# Patient Record
Sex: Female | Born: 1935 | Race: White | Hispanic: No | Smoking: Never smoker
Health system: Southern US, Community
[De-identification: ages and names within clinical notes are randomized; demographics above are authoritative.]

## PROBLEM LIST (undated history)

## (undated) DIAGNOSIS — F419 Anxiety disorder, unspecified: Secondary | ICD-10-CM

## (undated) DIAGNOSIS — K228 Other specified diseases of esophagus: Secondary | ICD-10-CM

## (undated) DIAGNOSIS — H269 Unspecified cataract: Secondary | ICD-10-CM

## (undated) DIAGNOSIS — G63 Polyneuropathy in diseases classified elsewhere: Secondary | ICD-10-CM

## (undated) DIAGNOSIS — R569 Unspecified convulsions: Secondary | ICD-10-CM

## (undated) DIAGNOSIS — G4730 Sleep apnea, unspecified: Secondary | ICD-10-CM

## (undated) DIAGNOSIS — E785 Hyperlipidemia, unspecified: Secondary | ICD-10-CM

## (undated) DIAGNOSIS — N390 Urinary tract infection, site not specified: Secondary | ICD-10-CM

## (undated) DIAGNOSIS — K829 Disease of gallbladder, unspecified: Secondary | ICD-10-CM

## (undated) DIAGNOSIS — I639 Cerebral infarction, unspecified: Secondary | ICD-10-CM

## (undated) DIAGNOSIS — R600 Localized edema: Secondary | ICD-10-CM

## (undated) DIAGNOSIS — E669 Obesity, unspecified: Secondary | ICD-10-CM

## (undated) DIAGNOSIS — H9190 Unspecified hearing loss, unspecified ear: Secondary | ICD-10-CM

## (undated) DIAGNOSIS — Z9289 Personal history of other medical treatment: Secondary | ICD-10-CM

## (undated) DIAGNOSIS — G2581 Restless legs syndrome: Secondary | ICD-10-CM

## (undated) DIAGNOSIS — I6930 Unspecified sequelae of cerebral infarction: Secondary | ICD-10-CM

## (undated) DIAGNOSIS — I4891 Unspecified atrial fibrillation: Secondary | ICD-10-CM

## (undated) DIAGNOSIS — G5602 Carpal tunnel syndrome, left upper limb: Secondary | ICD-10-CM

## (undated) DIAGNOSIS — R296 Repeated falls: Secondary | ICD-10-CM

## (undated) DIAGNOSIS — I10 Essential (primary) hypertension: Secondary | ICD-10-CM

## (undated) DIAGNOSIS — M1990 Unspecified osteoarthritis, unspecified site: Secondary | ICD-10-CM

## (undated) DIAGNOSIS — K2289 Other specified disease of esophagus: Secondary | ICD-10-CM

## (undated) DIAGNOSIS — R251 Tremor, unspecified: Secondary | ICD-10-CM

## (undated) DIAGNOSIS — K219 Gastro-esophageal reflux disease without esophagitis: Secondary | ICD-10-CM

## (undated) DIAGNOSIS — G40109 Localization-related (focal) (partial) symptomatic epilepsy and epileptic syndromes with simple partial seizures, not intractable, without status epilepticus: Secondary | ICD-10-CM

## (undated) HISTORY — DX: Repeated falls: R29.6

## (undated) HISTORY — DX: Personal history of other medical treatment: Z92.89

## (undated) HISTORY — DX: Anxiety disorder, unspecified: F41.9

## (undated) HISTORY — DX: Unspecified sequelae of cerebral infarction: I69.30

## (undated) HISTORY — DX: Disease of gallbladder, unspecified: K82.9

## (undated) HISTORY — DX: Cerebral infarction, unspecified: I63.9

## (undated) HISTORY — DX: Obesity, unspecified: E66.9

## (undated) HISTORY — DX: Other specified disease of esophagus: K22.89

## (undated) HISTORY — DX: Hyperlipidemia, unspecified: E78.5

## (undated) HISTORY — DX: Tremor, unspecified: R25.1

## (undated) HISTORY — PX: APPENDECTOMY: SHX54

## (undated) HISTORY — PX: CHOLECYSTECTOMY: SHX55

## (undated) HISTORY — DX: Unspecified hearing loss, unspecified ear: H91.90

## (undated) HISTORY — DX: Unspecified osteoarthritis, unspecified site: M19.90

## (undated) HISTORY — DX: Sleep apnea, unspecified: G47.30

## (undated) HISTORY — DX: Restless legs syndrome: G25.81

## (undated) HISTORY — DX: Polyneuropathy in diseases classified elsewhere: G63

## (undated) HISTORY — PX: ABDOMINAL HYSTERECTOMY: SHX81

## (undated) HISTORY — PX: CATARACT EXTRACTION: SUR2

## (undated) HISTORY — DX: Gastro-esophageal reflux disease without esophagitis: K21.9

## (undated) HISTORY — DX: Localization-related (focal) (partial) symptomatic epilepsy and epileptic syndromes with simple partial seizures, not intractable, without status epilepticus: G40.109

## (undated) HISTORY — PX: TONSILLECTOMY: SUR1361

## (undated) HISTORY — PX: CARPAL TUNNEL RELEASE: SHX101

## (undated) HISTORY — PX: OTHER SURGICAL HISTORY: SHX169

## (undated) HISTORY — DX: Unspecified convulsions: R56.9

## (undated) HISTORY — PX: FRACTURE SURGERY: SHX138

## (undated) HISTORY — DX: Unspecified cataract: H26.9

## (undated) HISTORY — DX: Unspecified atrial fibrillation: I48.91

## (undated) HISTORY — DX: Carpal tunnel syndrome, left upper limb: G56.02

## (undated) HISTORY — DX: Other specified diseases of esophagus: K22.8

## (undated) HISTORY — PX: CATARACT EXTRACTION, BILATERAL: SHX1313

## (undated) HISTORY — PX: BREAST SURGERY: SHX581

---

## 1998-07-30 ENCOUNTER — Ambulatory Visit (HOSPITAL_BASED_OUTPATIENT_CLINIC_OR_DEPARTMENT_OTHER): Admission: RE | Admit: 1998-07-30 | Discharge: 1998-07-30 | Payer: Self-pay | Admitting: Plastic Surgery

## 1999-05-06 ENCOUNTER — Ambulatory Visit (HOSPITAL_COMMUNITY): Admission: RE | Admit: 1999-05-06 | Discharge: 1999-05-06 | Payer: Self-pay | Admitting: Cardiology

## 1999-11-25 ENCOUNTER — Ambulatory Visit (HOSPITAL_COMMUNITY): Admission: RE | Admit: 1999-11-25 | Discharge: 1999-11-25 | Payer: Self-pay | Admitting: Gastroenterology

## 1999-11-25 HISTORY — PX: UPPER GASTROINTESTINAL ENDOSCOPY: SHX188

## 1999-11-30 ENCOUNTER — Ambulatory Visit (HOSPITAL_COMMUNITY): Admission: RE | Admit: 1999-11-30 | Discharge: 1999-11-30 | Payer: Self-pay | Admitting: Gastroenterology

## 2000-02-15 ENCOUNTER — Encounter (INDEPENDENT_AMBULATORY_CARE_PROVIDER_SITE_OTHER): Payer: Self-pay | Admitting: *Deleted

## 2000-02-15 ENCOUNTER — Inpatient Hospital Stay (HOSPITAL_COMMUNITY)
Admission: RE | Admit: 2000-02-15 | Discharge: 2000-02-18 | Payer: Self-pay | Admitting: Thoracic Surgery (Cardiothoracic Vascular Surgery)

## 2000-02-15 ENCOUNTER — Encounter: Payer: Self-pay | Admitting: Thoracic Surgery (Cardiothoracic Vascular Surgery)

## 2000-02-15 HISTORY — PX: BRONCHOSCOPY: SUR163

## 2000-02-16 ENCOUNTER — Encounter: Payer: Self-pay | Admitting: Thoracic Surgery (Cardiothoracic Vascular Surgery)

## 2000-02-17 ENCOUNTER — Encounter: Payer: Self-pay | Admitting: Thoracic Surgery (Cardiothoracic Vascular Surgery)

## 2000-03-02 ENCOUNTER — Encounter: Payer: Self-pay | Admitting: Thoracic Surgery (Cardiothoracic Vascular Surgery)

## 2000-03-02 ENCOUNTER — Encounter
Admission: RE | Admit: 2000-03-02 | Discharge: 2000-03-02 | Payer: Self-pay | Admitting: Thoracic Surgery (Cardiothoracic Vascular Surgery)

## 2000-08-31 ENCOUNTER — Encounter
Admission: RE | Admit: 2000-08-31 | Discharge: 2000-08-31 | Payer: Self-pay | Admitting: Thoracic Surgery (Cardiothoracic Vascular Surgery)

## 2000-08-31 ENCOUNTER — Encounter: Payer: Self-pay | Admitting: Thoracic Surgery (Cardiothoracic Vascular Surgery)

## 2000-11-02 ENCOUNTER — Encounter
Admission: RE | Admit: 2000-11-02 | Discharge: 2000-11-02 | Payer: Self-pay | Admitting: Thoracic Surgery (Cardiothoracic Vascular Surgery)

## 2000-11-02 ENCOUNTER — Encounter: Payer: Self-pay | Admitting: Thoracic Surgery (Cardiothoracic Vascular Surgery)

## 2001-01-16 ENCOUNTER — Encounter: Payer: Self-pay | Admitting: Gastroenterology

## 2001-01-16 HISTORY — PX: COLONOSCOPY: SHX174

## 2001-02-14 ENCOUNTER — Encounter: Payer: Self-pay | Admitting: Internal Medicine

## 2001-02-14 ENCOUNTER — Ambulatory Visit (HOSPITAL_COMMUNITY): Admission: RE | Admit: 2001-02-14 | Discharge: 2001-02-14 | Payer: Self-pay | Admitting: Internal Medicine

## 2001-04-20 ENCOUNTER — Ambulatory Visit (HOSPITAL_COMMUNITY): Admission: RE | Admit: 2001-04-20 | Discharge: 2001-04-20 | Payer: Self-pay | Admitting: Internal Medicine

## 2001-04-20 ENCOUNTER — Encounter: Payer: Self-pay | Admitting: Internal Medicine

## 2001-05-24 ENCOUNTER — Encounter
Admission: RE | Admit: 2001-05-24 | Discharge: 2001-05-24 | Payer: Self-pay | Admitting: Thoracic Surgery (Cardiothoracic Vascular Surgery)

## 2001-05-24 ENCOUNTER — Encounter: Payer: Self-pay | Admitting: Thoracic Surgery (Cardiothoracic Vascular Surgery)

## 2001-05-25 ENCOUNTER — Encounter: Payer: Self-pay | Admitting: Internal Medicine

## 2001-05-25 ENCOUNTER — Ambulatory Visit (HOSPITAL_COMMUNITY): Admission: RE | Admit: 2001-05-25 | Discharge: 2001-05-25 | Payer: Self-pay | Admitting: Internal Medicine

## 2001-08-31 ENCOUNTER — Encounter: Payer: Self-pay | Admitting: Internal Medicine

## 2001-08-31 ENCOUNTER — Ambulatory Visit (HOSPITAL_COMMUNITY): Admission: RE | Admit: 2001-08-31 | Discharge: 2001-08-31 | Payer: Self-pay | Admitting: Internal Medicine

## 2002-02-01 ENCOUNTER — Ambulatory Visit (HOSPITAL_COMMUNITY): Admission: RE | Admit: 2002-02-01 | Discharge: 2002-02-01 | Payer: Self-pay | Admitting: Internal Medicine

## 2002-02-01 ENCOUNTER — Encounter: Payer: Self-pay | Admitting: Internal Medicine

## 2002-05-30 ENCOUNTER — Encounter
Admission: RE | Admit: 2002-05-30 | Discharge: 2002-05-30 | Payer: Self-pay | Admitting: Thoracic Surgery (Cardiothoracic Vascular Surgery)

## 2002-05-30 ENCOUNTER — Encounter: Payer: Self-pay | Admitting: Thoracic Surgery (Cardiothoracic Vascular Surgery)

## 2003-02-25 ENCOUNTER — Encounter: Payer: Self-pay | Admitting: Internal Medicine

## 2003-02-25 ENCOUNTER — Ambulatory Visit (HOSPITAL_COMMUNITY): Admission: RE | Admit: 2003-02-25 | Discharge: 2003-02-25 | Payer: Self-pay | Admitting: Internal Medicine

## 2003-04-02 ENCOUNTER — Encounter: Payer: Self-pay | Admitting: Gastroenterology

## 2003-04-02 ENCOUNTER — Ambulatory Visit (HOSPITAL_COMMUNITY): Admission: RE | Admit: 2003-04-02 | Discharge: 2003-04-02 | Payer: Self-pay | Admitting: Gastroenterology

## 2003-04-23 ENCOUNTER — Encounter: Payer: Self-pay | Admitting: Gastroenterology

## 2003-04-23 HISTORY — PX: ESOPHAGOGASTRODUODENOSCOPY: SHX1529

## 2003-05-31 ENCOUNTER — Encounter
Admission: RE | Admit: 2003-05-31 | Discharge: 2003-05-31 | Payer: Self-pay | Admitting: Thoracic Surgery (Cardiothoracic Vascular Surgery)

## 2003-10-07 ENCOUNTER — Ambulatory Visit (HOSPITAL_COMMUNITY): Admission: RE | Admit: 2003-10-07 | Discharge: 2003-10-07 | Payer: Self-pay | Admitting: Internal Medicine

## 2004-04-13 ENCOUNTER — Encounter
Admission: RE | Admit: 2004-04-13 | Discharge: 2004-04-13 | Payer: Self-pay | Admitting: Thoracic Surgery (Cardiothoracic Vascular Surgery)

## 2004-11-09 ENCOUNTER — Ambulatory Visit: Payer: Self-pay | Admitting: Orthopedic Surgery

## 2004-11-11 ENCOUNTER — Encounter: Payer: Self-pay | Admitting: Orthopedic Surgery

## 2005-02-12 ENCOUNTER — Ambulatory Visit: Payer: Self-pay | Admitting: Orthopedic Surgery

## 2005-02-12 ENCOUNTER — Observation Stay (HOSPITAL_COMMUNITY): Admission: EM | Admit: 2005-02-12 | Discharge: 2005-02-13 | Payer: Self-pay | Admitting: Emergency Medicine

## 2005-02-17 ENCOUNTER — Ambulatory Visit: Payer: Self-pay | Admitting: Orthopedic Surgery

## 2005-02-24 ENCOUNTER — Ambulatory Visit: Payer: Self-pay | Admitting: Orthopedic Surgery

## 2005-02-26 ENCOUNTER — Ambulatory Visit (HOSPITAL_COMMUNITY): Admission: RE | Admit: 2005-02-26 | Discharge: 2005-02-26 | Payer: Self-pay | Admitting: Orthopedic Surgery

## 2005-03-03 ENCOUNTER — Ambulatory Visit: Payer: Self-pay | Admitting: Orthopedic Surgery

## 2005-03-09 ENCOUNTER — Ambulatory Visit: Payer: Self-pay | Admitting: Orthopedic Surgery

## 2005-03-09 ENCOUNTER — Ambulatory Visit (HOSPITAL_COMMUNITY): Admission: RE | Admit: 2005-03-09 | Discharge: 2005-03-09 | Payer: Self-pay | Admitting: Orthopedic Surgery

## 2005-03-11 ENCOUNTER — Ambulatory Visit: Payer: Self-pay | Admitting: Orthopedic Surgery

## 2005-03-18 ENCOUNTER — Ambulatory Visit: Payer: Self-pay | Admitting: Orthopedic Surgery

## 2005-03-29 ENCOUNTER — Ambulatory Visit: Payer: Self-pay | Admitting: Orthopedic Surgery

## 2005-04-08 ENCOUNTER — Ambulatory Visit: Payer: Self-pay | Admitting: Orthopedic Surgery

## 2005-04-30 ENCOUNTER — Encounter
Admission: RE | Admit: 2005-04-30 | Discharge: 2005-04-30 | Payer: Self-pay | Admitting: Thoracic Surgery (Cardiothoracic Vascular Surgery)

## 2005-05-06 ENCOUNTER — Ambulatory Visit: Payer: Self-pay | Admitting: Orthopedic Surgery

## 2005-05-14 ENCOUNTER — Encounter (HOSPITAL_COMMUNITY): Admission: RE | Admit: 2005-05-14 | Discharge: 2005-06-13 | Payer: Self-pay | Admitting: Orthopedic Surgery

## 2005-06-16 ENCOUNTER — Encounter (HOSPITAL_COMMUNITY): Admission: RE | Admit: 2005-06-16 | Discharge: 2005-07-08 | Payer: Self-pay | Admitting: Orthopedic Surgery

## 2005-06-24 ENCOUNTER — Ambulatory Visit: Payer: Self-pay | Admitting: Orthopedic Surgery

## 2005-07-13 ENCOUNTER — Encounter (HOSPITAL_COMMUNITY): Admission: RE | Admit: 2005-07-13 | Discharge: 2005-08-12 | Payer: Self-pay | Admitting: Orthopedic Surgery

## 2005-07-22 ENCOUNTER — Ambulatory Visit: Payer: Self-pay | Admitting: Orthopedic Surgery

## 2005-08-06 ENCOUNTER — Ambulatory Visit (HOSPITAL_COMMUNITY): Admission: RE | Admit: 2005-08-06 | Discharge: 2005-08-06 | Payer: Self-pay | Admitting: Orthopedic Surgery

## 2005-08-06 ENCOUNTER — Ambulatory Visit: Payer: Self-pay | Admitting: Orthopedic Surgery

## 2005-08-09 ENCOUNTER — Ambulatory Visit: Payer: Self-pay | Admitting: Orthopedic Surgery

## 2005-08-18 ENCOUNTER — Ambulatory Visit: Payer: Self-pay | Admitting: Orthopedic Surgery

## 2005-09-09 ENCOUNTER — Ambulatory Visit (HOSPITAL_COMMUNITY): Admission: RE | Admit: 2005-09-09 | Discharge: 2005-09-09 | Payer: Self-pay | Admitting: Family Medicine

## 2006-02-28 ENCOUNTER — Ambulatory Visit: Payer: Self-pay | Admitting: Orthopedic Surgery

## 2006-03-07 ENCOUNTER — Ambulatory Visit: Payer: Self-pay | Admitting: Orthopedic Surgery

## 2006-03-11 ENCOUNTER — Ambulatory Visit: Payer: Self-pay | Admitting: Orthopedic Surgery

## 2006-03-11 ENCOUNTER — Ambulatory Visit (HOSPITAL_COMMUNITY): Admission: RE | Admit: 2006-03-11 | Discharge: 2006-03-11 | Payer: Self-pay | Admitting: Orthopedic Surgery

## 2006-03-16 ENCOUNTER — Ambulatory Visit: Payer: Self-pay | Admitting: Orthopedic Surgery

## 2006-03-23 ENCOUNTER — Ambulatory Visit: Payer: Self-pay | Admitting: Orthopedic Surgery

## 2006-04-06 ENCOUNTER — Ambulatory Visit: Payer: Self-pay | Admitting: Orthopedic Surgery

## 2006-05-04 ENCOUNTER — Ambulatory Visit: Payer: Self-pay | Admitting: Orthopedic Surgery

## 2006-05-10 ENCOUNTER — Encounter
Admission: RE | Admit: 2006-05-10 | Discharge: 2006-05-10 | Payer: Self-pay | Admitting: Thoracic Surgery (Cardiothoracic Vascular Surgery)

## 2006-06-16 ENCOUNTER — Ambulatory Visit: Payer: Self-pay | Admitting: Orthopedic Surgery

## 2006-06-30 ENCOUNTER — Ambulatory Visit: Payer: Self-pay | Admitting: Orthopedic Surgery

## 2006-08-01 ENCOUNTER — Ambulatory Visit: Payer: Self-pay | Admitting: Orthopedic Surgery

## 2006-09-12 ENCOUNTER — Ambulatory Visit: Payer: Self-pay | Admitting: Orthopedic Surgery

## 2006-10-06 ENCOUNTER — Ambulatory Visit (HOSPITAL_COMMUNITY): Admission: RE | Admit: 2006-10-06 | Discharge: 2006-10-06 | Payer: Self-pay | Admitting: Family Medicine

## 2006-10-07 ENCOUNTER — Ambulatory Visit (HOSPITAL_COMMUNITY): Admission: RE | Admit: 2006-10-07 | Discharge: 2006-10-07 | Payer: Self-pay | Admitting: Family Medicine

## 2007-03-30 ENCOUNTER — Ambulatory Visit (HOSPITAL_COMMUNITY): Admission: RE | Admit: 2007-03-30 | Discharge: 2007-03-30 | Payer: Self-pay | Admitting: Urology

## 2007-05-30 ENCOUNTER — Telehealth: Payer: Self-pay | Admitting: Orthopedic Surgery

## 2007-07-11 DIAGNOSIS — Z8679 Personal history of other diseases of the circulatory system: Secondary | ICD-10-CM | POA: Insufficient documentation

## 2007-07-17 ENCOUNTER — Ambulatory Visit: Payer: Self-pay | Admitting: Orthopedic Surgery

## 2007-07-17 DIAGNOSIS — M19079 Primary osteoarthritis, unspecified ankle and foot: Secondary | ICD-10-CM | POA: Insufficient documentation

## 2007-12-19 ENCOUNTER — Telehealth: Payer: Self-pay | Admitting: Orthopedic Surgery

## 2007-12-20 ENCOUNTER — Encounter: Payer: Self-pay | Admitting: Orthopedic Surgery

## 2008-01-30 ENCOUNTER — Ambulatory Visit (HOSPITAL_COMMUNITY): Admission: RE | Admit: 2008-01-30 | Discharge: 2008-01-30 | Payer: Self-pay | Admitting: *Deleted

## 2008-04-18 ENCOUNTER — Ambulatory Visit: Payer: Self-pay | Admitting: Thoracic Surgery (Cardiothoracic Vascular Surgery)

## 2008-04-18 ENCOUNTER — Encounter
Admission: RE | Admit: 2008-04-18 | Discharge: 2008-04-18 | Payer: Self-pay | Admitting: Thoracic Surgery (Cardiothoracic Vascular Surgery)

## 2008-11-20 ENCOUNTER — Ambulatory Visit: Payer: Self-pay | Admitting: Orthopedic Surgery

## 2008-11-20 DIAGNOSIS — M25559 Pain in unspecified hip: Secondary | ICD-10-CM

## 2008-11-20 DIAGNOSIS — M25569 Pain in unspecified knee: Secondary | ICD-10-CM

## 2008-11-20 DIAGNOSIS — M25549 Pain in joints of unspecified hand: Secondary | ICD-10-CM

## 2008-12-04 ENCOUNTER — Encounter: Payer: Self-pay | Admitting: Gastroenterology

## 2009-01-02 ENCOUNTER — Ambulatory Visit: Payer: Self-pay | Admitting: Gastroenterology

## 2009-01-02 DIAGNOSIS — K219 Gastro-esophageal reflux disease without esophagitis: Secondary | ICD-10-CM | POA: Insufficient documentation

## 2009-01-02 DIAGNOSIS — I10 Essential (primary) hypertension: Secondary | ICD-10-CM | POA: Insufficient documentation

## 2009-01-02 DIAGNOSIS — K625 Hemorrhage of anus and rectum: Secondary | ICD-10-CM

## 2009-01-17 ENCOUNTER — Ambulatory Visit: Payer: Self-pay | Admitting: Gastroenterology

## 2009-01-17 HISTORY — PX: COLONOSCOPY: SHX174

## 2009-02-04 ENCOUNTER — Ambulatory Visit (HOSPITAL_COMMUNITY): Admission: RE | Admit: 2009-02-04 | Discharge: 2009-02-04 | Payer: Self-pay | Admitting: Urology

## 2009-05-13 ENCOUNTER — Ambulatory Visit: Payer: Self-pay | Admitting: Thoracic Surgery (Cardiothoracic Vascular Surgery)

## 2009-05-13 ENCOUNTER — Encounter
Admission: RE | Admit: 2009-05-13 | Discharge: 2009-05-13 | Payer: Self-pay | Admitting: Thoracic Surgery (Cardiothoracic Vascular Surgery)

## 2009-06-17 ENCOUNTER — Encounter: Payer: Self-pay | Admitting: Gastroenterology

## 2009-06-24 HISTORY — PX: TRANSTHORACIC ECHOCARDIOGRAM: SHX275

## 2009-09-02 ENCOUNTER — Encounter: Payer: Self-pay | Admitting: Orthopedic Surgery

## 2009-09-02 ENCOUNTER — Ambulatory Visit (HOSPITAL_COMMUNITY): Admission: RE | Admit: 2009-09-02 | Discharge: 2009-09-02 | Payer: Self-pay | Admitting: Preventative Medicine

## 2009-09-08 ENCOUNTER — Encounter: Payer: Self-pay | Admitting: Orthopedic Surgery

## 2009-09-11 ENCOUNTER — Telehealth: Payer: Self-pay | Admitting: Orthopedic Surgery

## 2009-09-11 ENCOUNTER — Ambulatory Visit: Payer: Self-pay | Admitting: Orthopedic Surgery

## 2009-09-11 DIAGNOSIS — R269 Unspecified abnormalities of gait and mobility: Secondary | ICD-10-CM

## 2009-09-11 DIAGNOSIS — IMO0002 Reserved for concepts with insufficient information to code with codable children: Secondary | ICD-10-CM

## 2009-09-11 DIAGNOSIS — M1710 Unilateral primary osteoarthritis, unspecified knee: Secondary | ICD-10-CM

## 2009-09-16 ENCOUNTER — Encounter (HOSPITAL_COMMUNITY): Admission: RE | Admit: 2009-09-16 | Discharge: 2009-10-16 | Payer: Self-pay | Admitting: Orthopedic Surgery

## 2009-09-16 ENCOUNTER — Encounter: Payer: Self-pay | Admitting: Orthopedic Surgery

## 2009-09-29 ENCOUNTER — Ambulatory Visit (HOSPITAL_COMMUNITY): Admission: RE | Admit: 2009-09-29 | Discharge: 2009-09-29 | Payer: Self-pay | Admitting: Ophthalmology

## 2009-10-16 ENCOUNTER — Ambulatory Visit: Payer: Self-pay | Admitting: Orthopedic Surgery

## 2009-10-22 ENCOUNTER — Telehealth: Payer: Self-pay | Admitting: Orthopedic Surgery

## 2009-10-23 ENCOUNTER — Ambulatory Visit (HOSPITAL_COMMUNITY): Admission: RE | Admit: 2009-10-23 | Discharge: 2009-10-23 | Payer: Self-pay | Admitting: Orthopedic Surgery

## 2009-10-23 ENCOUNTER — Ambulatory Visit: Payer: Self-pay | Admitting: Orthopedic Surgery

## 2009-10-24 ENCOUNTER — Telehealth: Payer: Self-pay | Admitting: Orthopedic Surgery

## 2009-10-30 ENCOUNTER — Ambulatory Visit: Payer: Self-pay | Admitting: Orthopedic Surgery

## 2009-11-13 ENCOUNTER — Ambulatory Visit: Payer: Self-pay | Admitting: Orthopedic Surgery

## 2009-11-13 DIAGNOSIS — M5430 Sciatica, unspecified side: Secondary | ICD-10-CM | POA: Insufficient documentation

## 2009-11-14 ENCOUNTER — Encounter (INDEPENDENT_AMBULATORY_CARE_PROVIDER_SITE_OTHER): Payer: Self-pay | Admitting: *Deleted

## 2009-11-19 ENCOUNTER — Ambulatory Visit (HOSPITAL_COMMUNITY): Admission: RE | Admit: 2009-11-19 | Discharge: 2009-11-19 | Payer: Self-pay | Admitting: Orthopedic Surgery

## 2009-11-20 ENCOUNTER — Telehealth: Payer: Self-pay | Admitting: Orthopedic Surgery

## 2009-11-20 ENCOUNTER — Encounter (INDEPENDENT_AMBULATORY_CARE_PROVIDER_SITE_OTHER): Payer: Self-pay | Admitting: *Deleted

## 2009-11-20 DIAGNOSIS — M48061 Spinal stenosis, lumbar region without neurogenic claudication: Secondary | ICD-10-CM | POA: Insufficient documentation

## 2009-11-21 ENCOUNTER — Encounter (INDEPENDENT_AMBULATORY_CARE_PROVIDER_SITE_OTHER): Payer: Self-pay | Admitting: *Deleted

## 2009-11-24 ENCOUNTER — Telehealth: Payer: Self-pay | Admitting: Orthopedic Surgery

## 2009-11-27 ENCOUNTER — Encounter: Admission: RE | Admit: 2009-11-27 | Discharge: 2009-11-27 | Payer: Self-pay | Admitting: Orthopedic Surgery

## 2009-12-01 ENCOUNTER — Ambulatory Visit: Payer: Self-pay | Admitting: Orthopedic Surgery

## 2009-12-12 ENCOUNTER — Encounter: Admission: RE | Admit: 2009-12-12 | Discharge: 2009-12-12 | Payer: Self-pay | Admitting: Orthopedic Surgery

## 2009-12-29 ENCOUNTER — Ambulatory Visit (HOSPITAL_COMMUNITY)
Admission: RE | Admit: 2009-12-29 | Discharge: 2009-12-29 | Payer: Self-pay | Source: Home / Self Care | Admitting: Ophthalmology

## 2010-01-05 ENCOUNTER — Ambulatory Visit: Payer: Self-pay | Admitting: Orthopedic Surgery

## 2010-01-05 DIAGNOSIS — M76899 Other specified enthesopathies of unspecified lower limb, excluding foot: Secondary | ICD-10-CM | POA: Insufficient documentation

## 2010-05-04 ENCOUNTER — Ambulatory Visit: Payer: Self-pay | Admitting: Orthopedic Surgery

## 2010-05-05 ENCOUNTER — Telehealth: Payer: Self-pay | Admitting: Orthopedic Surgery

## 2010-05-12 ENCOUNTER — Telehealth: Payer: Self-pay | Admitting: Orthopedic Surgery

## 2010-05-18 ENCOUNTER — Encounter
Admission: RE | Admit: 2010-05-18 | Discharge: 2010-05-18 | Payer: Self-pay | Admitting: Thoracic Surgery (Cardiothoracic Vascular Surgery)

## 2010-05-18 ENCOUNTER — Ambulatory Visit: Payer: Self-pay | Admitting: Thoracic Surgery (Cardiothoracic Vascular Surgery)

## 2010-06-01 ENCOUNTER — Encounter: Payer: Self-pay | Admitting: Orthopedic Surgery

## 2010-06-01 ENCOUNTER — Telehealth: Payer: Self-pay | Admitting: Orthopedic Surgery

## 2010-06-02 ENCOUNTER — Encounter: Payer: Self-pay | Admitting: Orthopedic Surgery

## 2010-06-03 ENCOUNTER — Encounter (INDEPENDENT_AMBULATORY_CARE_PROVIDER_SITE_OTHER): Payer: Self-pay | Admitting: *Deleted

## 2010-06-08 ENCOUNTER — Encounter: Payer: Self-pay | Admitting: Orthopedic Surgery

## 2010-06-26 ENCOUNTER — Encounter
Admission: RE | Admit: 2010-06-26 | Discharge: 2010-06-26 | Payer: Self-pay | Source: Home / Self Care | Attending: Orthopedic Surgery | Admitting: Orthopedic Surgery

## 2010-07-12 HISTORY — PX: KNEE ARTHROSCOPY: SUR90

## 2010-08-01 ENCOUNTER — Encounter: Payer: Self-pay | Admitting: Thoracic Surgery (Cardiothoracic Vascular Surgery)

## 2010-08-02 ENCOUNTER — Encounter: Payer: Self-pay | Admitting: Preventative Medicine

## 2010-08-06 ENCOUNTER — Ambulatory Visit
Admission: RE | Admit: 2010-08-06 | Discharge: 2010-08-06 | Payer: Self-pay | Source: Home / Self Care | Attending: Orthopedic Surgery | Admitting: Orthopedic Surgery

## 2010-08-10 ENCOUNTER — Ambulatory Visit (HOSPITAL_COMMUNITY)
Admission: RE | Admit: 2010-08-10 | Discharge: 2010-08-10 | Payer: Self-pay | Source: Home / Self Care | Attending: Urology | Admitting: Urology

## 2010-08-11 NOTE — Assessment & Plan Note (Signed)
Summary: 1 M RE-CK RT KNEE/BALANCE/MEDICARE,MUT OF O/CAF   Visit Type:  Follow-up, PREOP  Referring Provider:  Dr. Wende Crease Primary Provider:  Oval Linsey, MD  CC:  RECHECK RIGHT KNE.  History of Present Illness: I saw Erin [name] in the office today for a followup visit.  She is a 77 years old woman with the complaint of:  right knee pain.  DOI 07/15/09 fell from bed onto her knee.  RIGHT knee pain after her fall associated loss of balance which is been chronic.  Her pain is been throbbing burning severe constant worse with standing and walking complains of catching and swelling.   MRI Right knee taken [facility] 09/02/09 for review., there is a torn medial meniscus with degenerative arthritis of the knee.  She does complain of medial pain.  Meds: Vytorin, Metopramadol, Nexium, Verapamil, Vitamin D3 C and B12, Potassium, Topiramate, Aleve.  meds:  tried so far are Relafen 500 mg tramadol no relief she is using a cane at this time  After  1 month i na  brace and injection, she still has severe pain. " I don't care what you do"        Allergies (verified): 1)  ! Codeine  Past History:  Past medical, surgical, family and social histories (including risk factors) reviewed, and no changes noted (except as noted below).  Past Medical History: Reviewed history from 09/11/2009 and no changes required. High Blood Pressure Reflux cholesterol osteoporosis GERD Arrhythmia Arthritis Hyperlipidemia nervousness  Past Surgical History: Reviewed history from 12/27/2008 and no changes required. breast lung kidney Hemorrhoidectomy toes wrist shoulder  knee carpal tunnel release left/08-06-05 [facility] 03/11/06 otif with dbx bone graft putty with stryker dorsal plate left wrist/ harrison 03-09-05 open bone graft left wrist/stryker/harrison Tonsillectomy Cholecystectomy Appendectomy Hysterectomy  Family History: Reviewed history from 09/11/2009 and no changes required. Family  History of Colon Cancer:sister FH of Cancer:  Family History of Diabetes Family History Coronary Heart Disease female < 1 Family History of Arthritis  Social History: Reviewed history from 09/11/2009 and no changes required. Widowed 2 boys Patient has never smoked.  Alcohol Use - no 2 cups of coffee per day Illicit Drug Use - no Occupation:  Retired  Review of Systems       She does have some sciatic nerve symptoms on the RIGHT leg  Constitutional:  Denies weight loss, weight gain, fever, chills, and fatigue. Cardiovascular:  Denies chest pain, palpitations, fainting, and murmurs. Respiratory:  Denies short of breath, wheezing, couch, tightness, pain on inspiration, and snoring . Gastrointestinal:  Denies heartburn, nausea, vomiting, diarrhea, constipation, and blood in your stools. Genitourinary:  Denies frequency, urgency, difficulty urinating, painful urination, flank pain, and bleeding in urine. Neurologic:  balance poor  tremors . Endocrine:  Denies excessive thirst, exessive urination, and heat or cold intolerance. Psychiatric:  Denies nervousness, depression, anxiety, and hallucinations. Skin:  Denies changes in the skin, poor healing, rash, itching, and redness. HEENT:  Denies blurred or double vision, eye pain, redness, and watering.  Physical Exam  Additional Exam:  GEN: well developed, well nourished, normal grooming and hygiene, no deformity and normal body habitus.   CDV: pulses are normal, no edema, no erythema. no tenderness  Lymph: normal lymph nodes   Skin: no rashes, skin lesions or open sores   NEURO: normal coordination, reflexes, sensation.   Psyche: awake, alert and oriented. Mood normal   Gait: canes to support the RIGHT lower extremity  Medial joint line tenderness no joint effusion range  of motion is full, motor exam is normal no joint laxity  Tenderness in the RIGHT gluteal region tenderness in the lower back.  Straight leg raise moderately  positive at 45   The upper extremities have normal appearance, ROM, strength and stability.   LEFT lower extremity: No tenderness or swelling, range of motion is full, no joint laxity, motor exam grade 5 per   Impression & Recommendations:  Problem # 1:  MEDIAL MENISCUS TEAR, RIGHT (ICD-836.0) Assessment Unchanged  Orders: Est. Patient Level IV (11914)  Problem # 2:  OSTEOARTHRITIS, KNEE, RIGHT (ICD-715.96) Assessment: Unchanged  Her updated medication list for this problem includes:    Ultracet 37.5-325 Mg Tabs (Tramadol-acetaminophen) .[name]... 1 by mouth q 4 as needed pain   SARK,  MED MENISECT.   Orders: Est. Patient Level IV (78295)  Patient Instructions: 1)  set up post op for after surgery  2)  if your pain is not better after arthroscopy you will need to get an mri of your back and get epidural injections

## 2010-08-11 NOTE — Progress Notes (Signed)
Summary: Follow up calls to PCPs, not taking new pts currently,call to pt  Phone Note Outgoing Call   Call placed to: PCP Summary of Call: Dr Anthony Sar office responded to referral -- Not taking new patients at this time. Calls made to all primary care offices in [place] -- none are taking new patients.  Call to patient to notify of status - no answer, no ans machine. Calls being made to surrounding area physician offices ([place], [employer]).   Initial call taken by: Cammie Sickle,  May 12, 2010 11:28 AM  Follow-up for Phone Call        So far only the [facility], Dr's Elizbeth Squires are accepting new patients.   I am fol'g up w/[facility] PCP's also,outside of [place] however patient still prefers to stay in [place] if possible d/t transportation. Follow-up by: Cammie Sickle,  May 19, 2010 1:58 PM  Additional Follow-up for Phone Call Additional follow up Details #1::        I reached patient and followed up; advised about requesting records from her present PCP, Dr Oval Linsey, in order to have available if we do locate a doctor.  She now states that she will call Dr Janna Arch, since still under his care, and make an appt & discuss all with him. To stay with this office for now. Will let us know. Additional Follow-up by: Cammie Sickle,  May 21, 2010 11:53 AM

## 2010-08-11 NOTE — Miscellaneous (Signed)
Summary: L4-5 referral  Clinical Lists Changes  Problems: Added new problem of SPINAL STENOSIS, LUMBAR (ICD-724.02) Orders: Added new Referral order of Misc. Referral (Misc. Ref) - Signed

## 2010-08-11 NOTE — Letter (Signed)
Summary: surgery order RT knee sched 10/23/09  surgery order RT knee sched 10/23/09   Imported By: Cammie Sickle 10/23/2009 11:22:30  _____________________________________________________________________  External Attachment:    Type:   Image     Comment:   External Document

## 2010-08-11 NOTE — Assessment & Plan Note (Signed)
Summary: new problem rt knee pain xr at [facility] MRI at [facility],medicare/bsf   Vital Signs:  Patient profile:   77 year old female Weight:      185 pounds Pulse rate:   16 / minute Resp:     82 per minute  Visit Type:  new problem Referring Provider:  Dr. Wende Crease Primary Provider:  Oval Linsey, MD  CC:  right knee pain.  History of Present Illness: I saw Erin [name] in the office today for a followup visit.  She is a 77 years old woman with the complaint of:  right knee pain.  DOI 07/15/09 fell from bed onto her knee.  RIGHT knee pain after her fall associated loss of balance which is been chronic.  Her pain is been throbbing burning severe constant worse with standing and walking complains of catching and swelling.  Medications tried so far are Relafen 500 mg tramadol no relief she is using a cane at this time  She does have some sciatic nerve symptoms on the RIGHT leg  Xrays [facility] on 09/05/09 on disc for review of the right knee.  MRI Right knee taken [facility] 09/02/09 for review., there is a torn medial meniscus with degenerative arthritis of the knee.  She does complain of medial pain.  Meds: Vytorin, Metoprolol, Relafen, Tramadol, Nexium, Verapamil, Vitamin D3 C and B12, Potassium, Topiramate.    Allergies: 1)  ! Codeine  Past History:  Past Medical History: High Blood Pressure Reflux cholesterol osteoporosis GERD Arrhythmia Arthritis Hyperlipidemia nervousness  Family History: Family History of Colon Cancer:sister FH of Cancer:  Family History of Diabetes Family History Coronary Heart Disease female < 23 Family History of Arthritis  Social History: Widowed 2 boys Patient has never smoked.  Alcohol Use - no 2 cups of coffee per day Illicit Drug Use - no Occupation:  Retired  Review of Systems General:  Complains of weight gain; denies weight loss, fever, chills, and fatigue. Cardiac :  Denies chest pain, palpitations, fainting, and  murmurs. Resp:  Complains of snoring; denies short of breath, wheezing, couch, tightness, pain on inspiration, and snoring . GI:  Complains of heartburn and constipation; denies nausea, vomiting, diarrhea, and blood in your stools. GU:  Complains of urgency; denies frequency, difficulty urinating, painful urination, flank pain, and bleeding in urine. Neuro:  Complains of unsteady gait and tremors; denies numbness, tingling, dizziness, and seizure. MS:  Complains of joint pain and swelling; denies instability, stiffness, redness, heat, and muscle pain. Endo:  Denies excessive thirst, exessive urination, and heat or cold intolerance. Psych:  Complains of nervousness; denies depression, anxiety, and hallucinations. Derm:  Denies changes in the skin, poor healing, rash, itching, and redness. EENT:  Complains of blurred or double vision; denies eye pain, redness, and watering. Immunology:  Denies seasonal allergies, sinus problems, and allergic to bee stings. Lymphatic:  Complains of brusing; denies easy bleeding.  Physical Exam  Additional Exam:  GEN: well developed, well nourished, normal grooming and hygiene, no deformity and normal body habitus.   CDV: pulses are normal, no edema, no erythema. no tenderness  Lymph: normal lymph nodes   Skin: no rashes, skin lesions or open sores   NEURO: normal coordination, reflexes, sensation.   Psyche: awake, alert and oriented. Mood normal   Gait: canes to support the RIGHT lower extremity  Medial joint line tenderness no joint effusion range of motion is full, motor exam is normal no joint laxity  Tenderness in the RIGHT gluteal region tenderness  in the lower back.  Straight leg raise moderately positive at 45   The upper extremities have normal appearance, ROM, strength and stability.   LEFT lower extremity: No tenderness or swelling, range of motion is full, no joint laxity, motor exam grade 5 per   Impression &  Recommendations:  Problem # 1:  OSTEOARTHRITIS, KNEE, RIGHT (ICD-715.96) Assessment New  Orders: Est. Patient Level IV (52841) Joint Aspirate / Injection, Large (20610) Depo- Medrol 40mg  (J1030)   RIGHT knee:  Verbal consent was obtained. The knee was prepped with alcohol and ethyl chloride. 1 cc of depomedrol 40mg /cc and 4 cc of lidocaine 1% was injected. there were no complications.  Problem # 2:  MEDIAL MENISCUS TEAR, RIGHT (ICD-836.0) Assessment: New  Orders: Est. Patient Level IV (32440) Joint Aspirate / Injection, Large (10272)  Problem # 3:  UNSTEADY GAIT (ICD-781.2) Assessment: New  Orders: Physical Therapy Referral (PT) Est. Patient Level IV (53664) Joint Aspirate / Injection, Large (20610)  Patient Instructions: 1)  Brace for walking  2)  You have received an injection of cortisone today. You may experience increased pain at the injection site. Apply ice pack to the area for 20 minutes every 2 hours and take 2 xtra strength tylenol every 8 hours. This increased pain will usually resolve in 24 hours. The injection will take effect in 3-10 days.  3)  Balance Assessment/ at [facility]  4)  return in 4 weeks  5)  use cane

## 2010-08-11 NOTE — Miscellaneous (Signed)
Summary: Rehab Report  Rehab Report   Imported By: Elvera Maria 09/26/2009 10:29:46  _____________________________________________________________________  External Attachment:    Type:   Image     Comment:   eval pt

## 2010-08-11 NOTE — Progress Notes (Signed)
Summary: pt states allergic to Rx  Phone Note Call from Patient   Caller: Patient Summary of Call: Patient called this morning (day 1 post op); states that she rec'd Rx Hydrocodone when she was discharged from hospital 10/23/09, and said she is allergic to codeine. States "itching all over."  (will discontinue till further advised.) * Told patient Dr Romeo Apple is out of office; Dr Hilda Lias covering.   [facility] [place] [phone] Initial call taken by: Cammie Sickle,  October 24, 2009 8:45 AM  Follow-up for Phone Call        I advised Dr Hilda Lias,  I spoke w/ their office. Per Gavin Pound, Dr H[facility] Lias [facility]. Follow-up by: Cammie Sickle,  October 24, 2009 11:38 AM

## 2010-08-11 NOTE — Letter (Signed)
Summary: Generic Letter  [facility]  [address] [place] [address]  [place], [place] [address]   Phone: [phone]  Fax: [phone]    10/23/2009  Erin [name] [id number] DOB: 11-03-1929  Visit Type:  Follow-up, PREOP  Referring Provider:  Dr. Wende Crease Primary Provider:  Oval Linsey, MD  CC:  RECHECK RIGHT KNE.  History of Present Illness: I saw Erin [name] in the office today for a followup visit.  She is a 77 years old woman with the complaint of:  right knee pain.  DOI 07/15/09 fell from bed onto her knee.  RIGHT knee pain after her fall associated loss of balance which has  been chronic.  Her pain has been throbbing, burning, severe, constant; worse with standing and walking; complains of catching and swelling.   MRI Right knee taken [facility] 09/02/09 for review., there is a torn medial meniscus with degenerative arthritis of the knee.  She does complain of medial pain.  Meds: Vytorin, Metopramadol, Nexium, Verapamil, Vitamin D3 C and B12, Potassium, Topiramate, Aleve.  meds:  tried so far are Relafen 500 mg tramadol no relief she is using a cane at this time  After  1 month in a brace and injection, she still has severe pain. " I don't care what you do"     Allergies (verified): 1)  ! Codeine  Past History:  Past medical, surgical, family and social histories (including risk factors) reviewed, and no changes noted (except as noted below).  Past Medical History: Reviewed history from 09/11/2009 and no changes required. High Blood Pressure Reflux cholesterol osteoporosis GERD Arrhythmia Arthritis Hyperlipidemia nervousness  Past Surgical History: Reviewed history from 12/27/2008 and no changes required. breast lung kidney Hemorrhoidectomy toes wrist shoulder  knee carpal tunnel release left/08-06-05 Yakelin Grenier 03/11/06 otif with dbx bone graft putty with stryker dorsal plate left wrist/ Alter Moss 03-09-05 open bone graft left  wrist/stryker/Leandra Vanderweele Tonsillectomy Cholecystectomy Appendectomy Hysterectomy  Family History: Reviewed history from 09/11/2009 and no changes required. Family History of Colon Cancer:sister FH of Cancer:  Family History of Diabetes Family History Coronary Heart Disease female < 68 Family History of Arthritis  Social History: Reviewed history from 09/11/2009 and no changes required. Widowed 2 boys Patient has never smoked.  Alcohol Use - no 2 cups of coffee per day Illicit Drug Use - no Occupation:  Retired  Review of Systems       She does have some sciatic nerve symptoms on the RIGHT leg  Constitutional:  Denies weight loss, weight gain, fever, chills, and fatigue. Cardiovascular:  Denies chest pain, palpitations, fainting, and murmurs. Respiratory:  Denies short of breath, wheezing, couch, tightness, pain on inspiration, and snoring . Gastrointestinal:  Denies heartburn, nausea, vomiting, diarrhea, constipation, and blood in your stools. Genitourinary:  Denies frequency, urgency, difficulty urinating, painful urination, flank pain, and bleeding in urine. Neurologic:  balance poor  tremors . Endocrine:  Denies excessive thirst, exessive urination, and heat or cold intolerance. Psychiatric:  Denies nervousness, depression, anxiety, and hallucinations. Skin:  Denies changes in the skin, poor healing, rash, itching, and redness. HEENT:  Denies blurred or double vision, eye pain, redness, and watering.  Physical Exam  Additional Exam:   GEN: well developed, well nourished, normal grooming and hygiene, no deformity and normal body habitus.   CDV: pulses are normal, no edema, no erythema. no tenderness  Lymph: normal lymph nodes   Skin: no rashes, skin lesions or open sores   NEURO: normal coordination, reflexes, sensation.  Psyche: awake, alert and oriented. Mood normal   Gait: canes to support the RIGHT lower extremity  Medial joint line tenderness no joint  effusion range of motion is full, motor exam is normal no joint laxity  Tenderness in the RIGHT gluteal region tenderness in the lower back.  Straight leg raise moderately positive at 45   The upper extremities have normal appearance, ROM, strength and stability.   LEFT lower extremity: No tenderness or swelling, range of motion is full, no joint laxity, motor exam grade 5 per   Impression & Recommendations:  Problem # 1:  MEDIAL MENISCUS TEAR, RIGHT (ICD-836.0) Assessment Unchanged  Orders: Est. Patient Level IV (54098)  Problem # 2:  OSTEOARTHRITIS, KNEE, RIGHT (ICD-715.96) Assessment: Unchanged  Her updated medication list for this problem includes:    Ultracet 37.5-325 Mg Tabs (Tramadol-acetaminophen) .[name]... 1 by mouth q 4 as needed pain   SARK,  MED MENISECT.  Orders: Est. Patient Level IV (11914)  Patient Instructions: 1)  set up post op for after surgery  2)  if your pain is not better after arthroscopy you will need to get an mri of your back and get epidural injections

## 2010-08-11 NOTE — Medication Information (Signed)
Summary: Prior authorization request for medication  Prior authorization request for medication   Imported By: Jacklynn Ganong 06/03/2010 08:32:31  _____________________________________________________________________  External Attachment:    Type:   Image     Comment:   External Document

## 2010-08-11 NOTE — Progress Notes (Signed)
Summary: Patient asked about Rx for pain  Phone Note Call from Patient   Caller: Patient Summary of Call: Patient asked about medication for pain. If prescribed, [facility]. Initial call taken by: Cammie Sickle,  September 11, 2009 9:30 AM    New/Updated Medications: ULTRACET 37.5-325 MG TABS (TRAMADOL-ACETAMINOPHEN) 1 by mouth q 4 as needed pain Prescriptions: ULTRACET 37.5-325 MG TABS (TRAMADOL-ACETAMINOPHEN) 1 by mouth q 4 as needed pain  #42 x 1   Entered by:   Ether Griffins   Authorized by:   Fuller Canada MD   Signed by:   Ether Griffins on 09/11/2009   Method used:   [facility]       [facility]  [address] [phone]* (retail)       [address]       [place], [place]  [address]       Ph: [phone] or [phone]       Fax: [phone]   RxID:   [id number] ULTRACET 37.5-325 MG TABS (TRAMADOL-ACETAMINOPHEN) 1 by mouth q 4 as needed pain  #42 x 1   Entered and Authorized by:   Fuller Canada MD   Signed by:   Fuller Canada MD on 09/11/2009   Method used:   [facility]ph[facility] ...       [facility]  [address] [phone]* (retail)       [address]       [place], [place]  [address]       Ph: [phone] or [phone]       Fax: [phone]   RxID:   [id number]

## 2010-08-11 NOTE — Miscellaneous (Signed)
Summary: [employer] approved for Lyrica, faxed to [facility]  Clinical Lists Changes

## 2010-08-11 NOTE — Assessment & Plan Note (Signed)
Summary: rt knee pain post arthroscopy.[email or web address]   Visit Type:  Follow-up Referring Provider:  Dr. Wende Crease Primary Provider:  Oval Linsey, MD   History of Present Illness: I saw Erin [name] in the office today for a followup visit.  She is a 77 years old woman with the complaint of:  BACK AND KNEE  Follow up after 2nd ESI injection on 12-12-09.  On April 14 of this year she had the following surgery:post arthroscopy RIGHT knee partial medial meniscectomy for medial meniscal tear light abrasion chondroplasty of the medial femoral condyle.  Patient had arthritis on the medial side.  No other injuries or lesions.   Medications: Neurontin and Ultracet, she stopped because they made her itch, now takes Advil 3 per day, helps with knee.  Today she is complaining of numbness in her RIGHT foot worse at night and worse when she gets up in the morning.  Review of systems:  Has bilateral feet swelling for 4 months,Dr. Delbert Harness says he does not know why they are swelling,      Allergies: 1)  ! Codeine  Past History:  Past Medical History: Last updated: 09/11/2009 High Blood Pressure Reflux cholesterol osteoporosis GERD Arrhythmia Arthritis Hyperlipidemia nervousness  Past Surgical History: RIGHT knee arthroscopy partial medial meniscectomy 2011 breast lung kidney Hemorrhoidectomy toes wrist shoulder  knee carpal tunnel release left/08-06-05 h[facility] 03/11/06 otif with dbx bone graft putty with stryker dorsal plate left wrist/ [facility]arrison 03-09-05 open bone graft left wrist/stryker/[facility] Tonsillectomy Cholecystectomy Appendectomy Hysterectomy  Physical Exam  Additional Exam:  The patient is a well groomed normal appearance She is oriented x3 Mood and affect is normal She has no major limp when she walks  Her RIGHT knee has minimal tenderness over the medial hemi-joint with no joint effusion.  Her knee maintains 125 of flexion.  Her knee is stable.   Her strength in her knee is normal.  Skin is intact with no rash.  Shows normal pulse and temperature in her RIGHT lower extremity but she does have pretibial edema her sensation remains normal in the RIGHT lower extremity.  The straight leg raise test on the RIGHT side produces pain in the back of her leg but it doesn't seem to be radicular or associated with numbness or tingling.  She also has similar findings on the LEFT side producing pain on the RIGHT and no stretching pulling on the LEFT.   Impression & Recommendations:  Problem # 1:  SCIATICA (ICD-724.3)  at this point I think her numbness is coming from her lumbar spine condition and I started her on Lyrica 50 mg at bedtime to see if this might help.  Orders: Est. Patient Level III ([address])  Medications Added to Medication List This Visit: 1)  Lyrica 50 Mg Caps (Pregabalin) .[name].. 1 at bedtime  Other Orders: Primary Care Referral (Primary)  Patient Instructions: 1)  Start Lyrica 50 mg at bedtime  2)  f/u as needed  3)  Referral to Dr Lodema Hong for medical care  Prescriptions: LYRICA 50 MG CAPS (PREGABALIN) 1 at bedtime  #30 x 5   Entered and Authorized by:   Fuller Canada MD   Signed by:   Fuller Canada MD on 05/04/2010   Method used:   Print then Give to Patient   RxID:   [id number]    Orders Added: 1)  Primary Care Referral [Primary] 2)  Est. Patient Level III [95621]

## 2010-08-11 NOTE — Medication Information (Signed)
Summary: Prior Authorization request  Prior Authorization request   Imported By: Jacklynn Ganong 06/01/2010 13:06:50  _____________________________________________________________________  External Attachment:    Type:   Image     Comment:   External Document

## 2010-08-11 NOTE — Progress Notes (Signed)
Summary: ESI appointment scheduled  Phone Note Call from Patient   Caller: Patient Summary of Call: Patient called ,states received appt per [facility]; scheduled for [facility] 11/26/09 10:15am. Said working on transportation arrangements. Initial call taken by: Cammie Sickle,  Nov 24, 2009 12:32 PM

## 2010-08-11 NOTE — Medication Information (Signed)
Summary: Therapist, occupational for medication  Insurance approval for medication   Imported By: Jacklynn Ganong 06/08/2010 13:15:31  _____________________________________________________________________  External Attachment:    Type:   Image     Comment:   External Document

## 2010-08-11 NOTE — Progress Notes (Signed)
Summary: needs prior auth for Lyrica 50mg   Phone Note Outgoing Call Call back at [phone]   Summary of Call: called to get prior authorization for Lyrica 50mg  1 at bedtime, they are to be sending paperwork to be filled out, then fax response to [facility] [place] at [phone] Initial call taken by: Ether Griffins,  June 01, 2010 10:25 AM  Follow-up for Phone Call        I filled out request form, faxed back now waiting on response Follow-up by: Ether Griffins,  June 01, 2010 10:31 AM  Additional Follow-up for Phone Call Additional follow up Details #1::        No response rec'd as of 06/01/10 5:00pm Additional Follow-up by: Cammie Sickle,  June 01, 2010 5:10 PM

## 2010-08-11 NOTE — Progress Notes (Signed)
Summary: Pre-cert not required for surgery  Phone Note Outgoing Call   Summary of Call: No pre-cert is required from Medicare, Mut of [employer] for out-patient procedure scheduled at [facility] 10/23/09. CPT [phone] Initial call taken by: Cammie Sickle,  October 22, 2009 5:14 PM

## 2010-08-11 NOTE — Assessment & Plan Note (Signed)
Summary: RE-CK//FOL'G ESI/ RT LEG PAINMEDICARE,MUT OF OM/CAF   Visit Type:  post op Referring Provider:  Dr. Wende Crease Primary Provider:  Oval Linsey, MD  CC:  post op right knee.  History of Present Illness: 77 year old female had MRI which showed spinal stenosis  Recommend epidural injections  She also had previous knee surgery   post arthroscopy RIGHT knee partial medial meniscectomy for medial meniscal tear light abrasion chondroplasty of the medial femoral condyle.  Patient had arthritis on the medial side.  No other injuries or lesions.  10/23/09 DOS.  Medications: Neurontin 100 Mg Caps (Gabapentin) .[name].. 1 by mouth three times a day, Ultracet.  Now receiving epidural for right leg pain   She would like to get a second injection I agree that should be done  Schedule for second epidural injection and followup a few weeks after    Allergies: 1)  ! Codeine   Impression & Recommendations:  Problem # 1:  SPINAL STENOSIS, LUMBAR (ICD-724.02) Assessment Improved  moderate improvement with the first epidural  Orders: Est. Patient Level II (16109)  Patient Instructions: 1)  June 6 th second injection  2)  scheduled  3)  f/u June 27th

## 2010-08-11 NOTE — Progress Notes (Signed)
Summary: Referral to Dr. Lodema Hong.  Phone Note Outgoing Call   Call placed by: Waldon Reining,  May 05, 2010 11:11 AM Call placed to: Specialist Action Taken: Information Sent Summary of Call: I faxed a referral to Dr. Lodema Hong

## 2010-08-11 NOTE — Assessment & Plan Note (Signed)
Summary: F/U AFTER SECOND ESI/MEDICARE/MUTOF [place]/BSF   Visit Type:  Follow-up Referring Provider:  Dr. Wende Crease Primary Provider:  Oval Linsey, MD  CC:  KNEE AND BACK.  History of Present Illness: I saw Erin [name] in the office today for a followup visit.  She is a 77 years old woman with the complaint of:  BACK AND KNEE  Follow up after 2nd ESI injection on 12-12-09.  post arthroscopy RIGHT knee partial medial meniscectomy for medial meniscal tear light abrasion chondroplasty of the medial femoral condyle.  Patient had arthritis on the medial side.  No other injuries or lesions.  10/23/09 DOS.  Medications: Neurontin 100 Mg Caps (Gabapentin) .[name].. 1 by mouth three times a day, Ultracet.   Allergies: 1)  ! Codeine  Physical Exam  Additional Exam:  that he looks good today.  Development grooming hygiene normal  Mild varicose veins normal pulses in her RIGHT leg  Skins intact  She does have some tenderness over the medial knee near the bursa range of motion still good shrink normal, stability normal     Impression & Recommendations:  Problem # 1:  BURSITIS, RIGHT KNEE (ICD-726.60) Assessment New  Orders: Est. Patient Level III (04540) Joint Aspirate / Injection, Large (20610)RIGHT knee bursa injection   Verbal consent was obtained. The knee was prepped with alcohol and ethyl chloride. 1 cc of depomedrol 40mg /cc and 4 cc of lidocaine 1% was injected. there were no complications.    Depo- Medrol 40mg  (J1030)  Patient Instructions: 1)  You have received an injection of cortisone today. You may experience increased pain at the injection site. Apply ice pack to the area for 20 minutes every 2 hours and take 2 xtra strength tylenol every 8 hours. This increased pain will usually resolve in 24 hours. The injection will take effect in 3-10 days.  2)  Please schedule a follow-up appointment as needed.

## 2010-08-11 NOTE — Miscellaneous (Signed)
Summary: mri l spine aph 11/19/09 reg at 930am  Clinical Lists Changes  no precert needed for medicare, to call with results

## 2010-08-11 NOTE — Assessment & Plan Note (Signed)
Summary: RT KNEE/LEG PAIN/POST OP SURG 10/23/09/MEDICARE/CAF   Visit Type:  Follow-up Referring Provider:  Dr. Wende Crease Primary Provider:  Oval Linsey, MD  CC:  recheck knee post op.  History of Present Illness: 77 years old status post arthroscopy RIGHT knee partial medial meniscectomy for medial meniscal tear light abrasion chondroplasty of the medial femoral condyle.  Patient had arthritis on the medial side.  No other injuries or lesions.  10/23/09 DOS.    complains of back pain as well as Pain goes down shin, and behind knee, has veins breaking in her legs.  Using cane today.  Meds: Vytorin, Metopramadol, Nexium, Verapamil, Vitamin D3 C and B12, Potassium, Topiramate, Aleve.  meds:  tried so far are Relafen 500 mg, Tramadol for pain, ASA and Advil as needed.  Allergies: 1)  ! Codeine  Physical Exam  Additional Exam:  Her lumbar spine is tender her RIGHT gluteal region is tender straight leg raise is positive at 70.  She has tenderness behind the knee.  She has painless range of motion in the RIGHT knee.  No swelling no tenderness.  Reflexes were normal.  Quadriceps strength and plantar flexion dorsiflexion motor exam was normal.  No obvious sensory deficit to soft touch pressure pinprick or position   Impression & Recommendations:  Problem # 1:  SCIATICA (ICD-724.3)  I was really concerned about this prior to surgery and Mrs. Hegwood does remember that, that we may have trouble with her back and leg after the arthroscopy based on her symptoms of back pain and radicular pain.  She agrees to have MRI of the spine IM injection right hip   Her updated medication list for this problem includes:    Ultracet 37.5-325 Mg Tabs (Tramadol-acetaminophen) .[name]... 1 by mouth q 4 as needed pain  Orders: Est. Patient Level II (96045) Joint Aspirate / Injection, Large (20610)  Medications Added to Medication List This Visit: 1)  Neurontin 100 Mg Caps (Gabapentin) .[name].. 1 by  mouth three times a day  Patient Instructions: 1)  You have received an injection of cortisone today. You may experience increased pain at the injection site. Apply ice pack to the area for 20 minutes every 2 hours and take 2 xtra strength tylenol every 8 hours. This increased pain will usually resolve in 24 hours. The injection will take effect in 3-10 days.  2)  MRI back  Prescriptions: ULTRACET 37.5-325 MG TABS (TRAMADOL-ACETAMINOPHEN) 1 by mouth q 4 as needed pain  #42 x 1   Entered and Authorized by:   Fuller Canada MD   Signed by:   Fuller Canada MD on 11/13/2009   Method used:   Print then Give to Patient   RxID:   [id number] NEURONTIN 100 MG CAPS (GABAPENTIN) 1 by mouth three times a day  #60 x 2   Entered and Authorized by:   Fuller Canada MD   Signed by:   Fuller Canada MD on 11/13/2009   Method used:   Print then Give to Patient   RxID:   [id number]

## 2010-08-11 NOTE — Assessment & Plan Note (Signed)
Summary: POST OP 1/ARTHROS RT KNEE/SURG 10/23/09/MEDIC,MUT OF OM/CAF   Referring Provider:  Dr. Wende Crease Primary Provider:  Oval Linsey, MD   History of Present Illness: 77 years old status post arthroscopy RIGHT knee partial medial meniscectomy for medial meniscal tear light abrasion chondroplasty of the medial femoral condyle.  Patient had arthritis on the medial side.  No other injuries or lesions.  Postop visit #1 this is postop day 6  Current   Tramadol, ALLERGIC to hydrocodone and codeine  New great she has 0-105 of flexion no swelling  Stitches are removed  Patient will continue physical therapy on her own at home come back 3 weeks  use cane as needed  Allergies: 1)  ! Codeine   Impression & Recommendations:  Problem # 1:  AFTERCARE FOLLOW SURGERY MUSCULOSKEL SYSTEM NEC (ICD-V58.78)  Orders: Post-Op Check (16109)  Problem # 2:  MEDIAL MENISCUS TEAR, RIGHT (ICD-836.0)  Orders: Post-Op Check (60454)  Problem # 3:  OSTEOARTHRITIS, KNEE, RIGHT (ICD-715.96)  Her updated medication list for this problem includes:    Ultracet 37.5-325 Mg Tabs (Tramadol-acetaminophen) .[name]... 1 by mouth q 4 as needed pain    Her updated medication list for this problem includes:    Ultracet 37.5-325 Mg Tabs (Tramadol-acetaminophen) .[name]... 1 by mouth q 4 as needed pain  Orders: Post-Op Check (09811)  Patient Instructions: 1)  Return in 3 weeks  2)  exercise at home

## 2010-08-11 NOTE — Progress Notes (Signed)
Summary: mri results needed  Phone Note Call from Patient Call back at [facility] Phone [phone]   Summary of Call: please call pt with results Initial call taken by: Chasity Tereasa Coop,  Nov 20, 2009 11:27 AM

## 2010-08-11 NOTE — Miscellaneous (Signed)
Summary: faxed referral to [place] for ESI L4-5  Clinical Lists Changes

## 2010-08-11 NOTE — Letter (Signed)
Summary: Historic Patient File  Historic Patient File   Imported By: Elvera Maria 09/16/2009 11:25:54  _____________________________________________________________________  External Attachment:    Type:   Image     Comment:   history

## 2010-08-11 NOTE — Letter (Signed)
Summary: Historic Patient File  Historic Patient File   Imported By: Elvera Maria 09/16/2009 11:26:59  _____________________________________________________________________  External Attachment:    Type:   Image     Comment:   rt knee xray

## 2010-08-13 NOTE — Assessment & Plan Note (Signed)
Summary: rt knee pain needs xr/mcr/bsf   Visit Type:  Follow-up Referring Provider:  Dr. Wende Crease Primary Provider:  Oval Linsey, MD  CC:  right knee pain.  History of Present Illness: I saw Erin [name] in the office today for a followup visit.  She is a 77 years old woman with the complaint of:  RIGHT knee pain  Status post arthroscopy RIGHT knee.  Osteoarthritis RIGHT knee. Degenerative disc disease status post epidural series lumbar spine  Medications: Neurontin and Ultracet, she stopped because they made her itch, now takes Advil 3 per day, helps with knee. Lyrica 50 mg.  DOS 10-23-09.  post arthroscopy RIGHT knee partial medial meniscectomy for medial meniscal tear light abrasion chondroplasty of the medial femoral condyle.  Patient had arthritis on the medial side.  No other injuries or lesions.  Last seen on 05-04-10.   Complaints: 2 weeks ago, she stepped down and felt something pop in her right knee. She has been using Voltaren gel and it has been helping.   She now has essentially mild pain in most of his symptoms have resolved.  She basically just wants me to check her knee out.  Allergies: 1)  ! Codeine  Review of Systems       no back or radicular pain at this time  Physical Exam  Additional Exam:  RIGHT knee exam.  No major joint line tenderness, no swelling.  Functional range of motion is still present, 125.  The deep joint is stable.  There are no meniscal signs.  Gross motor strength is normal.  Normal sensation is noted in the RIGHT lower extremity and normal pulses and perfusion without edema.   Impression & Recommendations:  Problem # 1:  OSTEOARTHRITIS, KNEE, RIGHT (ICD-715.96)  Her updated medication list for this problem includes:    Ultracet 37.5-325 Mg Tabs (Tramadol-acetaminophen) .[name]... 1 by mouth q 4 as needed pain  Orders: Est. Patient Level III (16109)  Problem # 2:  SPINAL STENOSIS, LUMBAR (ICD-724.02)  Orders: Est. Patient  Level III (60454)  Medications Added to Medication List This Visit: 1)  Voltaren 1 % Gel (Diclofenac sodium) .... 4 grams on knee bid  Patient Instructions: 1)  Use Voltaren gel two times a day 2)  use brace while walking until knee feels better 3)  come back as needed Prescriptions: VOLTAREN 1 % GEL (DICLOFENAC SODIUM) 4 grams on knee bid  #3 tubes x 5   Entered and Authorized by:   Fuller Canada MD   Signed by:   Fuller Canada MD on 08/06/2010   Method used:   [facility] to ...       [facility]  [address] [phone]       [address]       [place], [place]  [address]       Ph: [phone]       Fax: [phone]   RxID:   [id number]    Orders Added: 1)  Est. Patient Level III [36644]

## 2010-09-30 LAB — BASIC METABOLIC PANEL
BUN: 12 mg/dL (ref 6–23)
Calcium: 9.3 mg/dL (ref 8.4–10.5)
Creatinine, Ser: 0.83 mg/dL (ref 0.4–1.2)
GFR calc non Af Amer: >60 mL/min (ref >60)
Glucose, Bld: 108 mg/dL — ABNORMAL HIGH (ref 70–99)

## 2010-10-04 LAB — BASIC METABOLIC PANEL
BUN: 13 mg/dL (ref 6–23)
CO2: 28 mEq/L (ref 19–32)
Calcium: 9.6 mg/dL (ref 8.4–10.5)
Creatinine, Ser: 0.89 mg/dL (ref 0.4–1.2)
Glucose, Bld: 130 mg/dL — ABNORMAL HIGH (ref 70–99)
Sodium: 138 mEq/L (ref 135–145)

## 2010-11-24 NOTE — Assessment & Plan Note (Signed)
OFFICE VISIT   Erin [name], Erin [name]  DOB:  May 05, 1930                                        April 18, 2008  CHART #:  [id number]   The patient is a 77 year old woman who had a VATS wedge resection in  2001 for what turned out to be a granuloma.  We followed her for several  years with multiple small lung nodules.  They remained stable for 2  years and since then, we have been following her with annual chest x-  rays.  She states that over the past 3 weeks, she has been having a  persistent raspy cough.  It is occasionally productive with yellow  sputum.  She denies any significant fevers, but has felt chilled on  occasion.  She has been taking her Tylenol for the cough, but without  any relief.   CURRENT MEDICATIONS:  Verapamil 240 mg daily, metoprolol 50 mg daily,  and Nexium 40 mg daily.  She has p.r.n. nitroglycerin, but does not had  to use that, vitamin B12 1000 mg daily, vitamin C 1000 international  units daily, potassium 99 mg b.i.d., calcium 600 plus 400 D 2 tablets  daily, Benicar 40/25 one tablet daily, and Vytorin 10/40 one tablet  daily.   ALLERGIES:  She is allergic codeine.   REVIEW OF SYSTEMS:  She has had some chest tightness and shortness  breath with exertion.  She is seeing Dr. Domingo Sep recently for  bradycardia.  Her medications were adjusted.  She complains of the  bronchitis and cough as described.  She also has a reflux urinary  frequency, still has pain in her legs with walking, and arthritis.   PHYSICAL EXAMINATION:  GENERAL:  The patient is a 77 year old white  female, in no acute distress.  VITAL SIGNS:  She is coughing frequently.  Her blood pressure is 122/80,  pulse 70, respirations are 18, and her ox saturation is 95% on room air.  NECK:  There is no cervical, supraclavicular, axillary, or epitrochlear  adenopathy.  LUNGS:  She also have bronchial breath sounds on the right greater than  left.  CARDIAC:  Has a regular  rate and rhythm.  Normal S1 and S2.   Chest x-ray shows stable right upper lobe scarring.  There is no active  or metastatic disease.   IMPRESSION:  The patient is now 8 years out from resection of a  granuloma.  She has no evidence of new lung nodules or growth of her  previous existing lung nodules.  She does have what sounds like  bronchitis.  She has got productive sputum, persistent cough, and has  been having great deal of difficulty with this over the past 3 weeks.  I  gave her prescription for Zithromax 500 mg p.o. daily for 3 days as well  as a prescription for [employer].  She has codeine allergies, so we  do not want to use Tussionex, but she will take Tessalon Perles 100 mg  p.o. daily t.i.d. p.r.n.  I will plan to see her back in a year with a  chest x-ray or for shortness of breath.   Salvatore Decent Dorris Fetch, M.D.  Electronically Signed   SCH/MEDQ  D:  04/18/2008  T:  04/19/2008  Job:  [id number]   cc:   Melvyn Novas, MD

## 2010-11-24 NOTE — Assessment & Plan Note (Signed)
OFFICE VISIT   MAO, LOCKNER  DOB:  1930-02-21                                        May 18, 2010  CHART #:  [id number]   HISTORY:  The patient is a 77 year old woman has been followed regarding  lung nodules.  She had a VATS in 2001 for a lung nodule which turned out  to be granulomatous disease.  She has been followed with multiple small  lung nodules ever since.  We followed her for a while with CT scans and  the nodules were stable.  We subsequently had been following with plain  chest x-rays for last several years and everything has been stable from  that standpoint as well.  She comes in today.  She says she is in a lot  of pain.  She has got 2 slip disks on her back and she just drove down  from [place] and that causes a lot of pain.  She has also been doing  through an anxious time, one brother died and the other is in very poor  health in a nursing home.  She has not had any problems with her  breathing.  No wheezing, cough, or other issues related to her  breathing.  She does say that it was found out that she does not have  Parkinson disease but just a resting tremor.   PAST MEDICAL HISTORY:  Significant for granulomatous lung disease,  hypertension, coronary artery disease, hyperlipidemia, degenerative disk  disease.   CURRENT MEDICATIONS:  1. Verapamil 120 mg daily.  2. Metoprolol 25 mg daily.  3. Nexium 40 mg daily.  4. Vitamin B12 1000 units daily.  5. Calcium plus D 2 tablets daily.  6. Vytorin 10/40 one tablet daily.  7. Topamax 25 mg b.i.d.  8. Niacin 500 mg daily.  9. Potassium gluconate 550 mg daily.  10.Cozaar 100 mg daily.   ALLERGIES:  She has an allergy codeine.   REVIEW OF SYSTEMS:  See HPI.  She says that her blood pressure usually  runs about 180/80.  She again has been very anxious due to family  member's illnesses and some other pain in her lower back due to her  degenerative disk disease.  She also  complains of some swelling in her  legs.  All other systems are negative.   PHYSICAL EXAMINATION:  The patient's blood pressure is 198/76, pulse 62,  respirations are 18, her oxygen saturation is 93% on room air.  Neurologically, she is alert and oriented x3.  She has a mild tremor but  no focal deficits.  Her HEENT exam is unremarkable.  Neck is without  thyromegaly or adenopathy.  Lungs are clear with equal breath sounds  bilaterally.  Cardiac exam has regular rate and rhythm.  Normal S1 and  S2.   IMAGING:  Chest x-ray shows a stable appearance with no new lung  nodules.  There are atherosclerotic changes in the thoracic aorta.  There is no pleural effusion.   IMPRESSION:  The patient is a 77 year old woman with a history of  granulomatous disease in her lungs.  She has no evidence of recurrent  disease at the present time.  Her biggest issue today other than pain  and anxiety due to the family member illness is her blood pressure which  is elevated at 198/80.  She says she typically runs about 180/80.  She  has an appointment with her cardiologist but she cannot remember the  name, but she has appointment with tomorrow, so I did not make any  changes to her medication regimen at this time as that should be handled  by the cardiologist.  I will plan to see her back in a year.  We will  repeat a chest x-ray just to make sure there is no evidence of recurrent  disease.   Salvatore Decent Dorris Fetch, M.D.  Electronically Signed   SCH/MEDQ  D:  05/18/2010  T:  05/19/2010  Job:  [id number]   cc:   Melvyn Novas, MD

## 2010-11-24 NOTE — Assessment & Plan Note (Signed)
OFFICE VISIT   Erin, [name]  DOB:  September 11, 1929                                        May 13, 2009  CHART #:  [id number]   REASON FOR VISIT:  Followup previous granulomatous disease.   HISTORY OF PRESENT ILLNESS:  The patient is a 77 year old woman who I  have followed for many years now regarding lung nodules.  She was last  seen in the office in October 2009.  She had undergone a VATS in 2001  for lung nodule, it turned out to be granulomatous disease.  She had  multiple small lung nodules that been followed with CT and have been  stable.  She subsequently has been followed with plain chest x-rays just  to make sure that there is no progression of any nodular disease.  The  patient has multiple issues that she is concerned about today, one is  that she feels like she is having some wheezing.  I was concerned that  could represent congestive heart failure.  She also has occasional  peripheral edema but none today.  She has recently been diagnosed with  Parkinson disease and was started on medication for that, and since then  she has had severe pain in her legs and ankles and strongly wants to get  a second opinion from another neurologist.  She is not sure that she in  fact has Parkinson disease.  She does state that her tremor has improved  with medication.   CURRENT MEDICATIONS:  1. Verapamil 180 mg daily.  2. Metoprolol 50 mg daily.  3. Benicar 40/25 one tablet daily.  4. Vytorin 10/40 one tablet daily.  5. Carbidopa/levodopa 25/100 one p.o. t.i.d.  6. Prilosec OTC.  7. She takes multiple different vitamins.   ALLERGIES:  She has an allergy to CODEINE.   PAST MEDICAL HISTORY:  Significant for granulomatous lung disease,  hypertension, question Parkinson disease, coronary artery disease,  hypertension, hyperlipidemia.   REVIEW OF SYSTEMS:  See HPI, otherwise no significant change from her  last visit.   PHYSICAL EXAMINATION:  The  patient is a 77 year old woman in no acute  distress.  Her blood pressure is 160/76, pulse 55, respirations were 18,  ox saturation 97% on room air.  Neurologically, she does have a resting  tremor most evident in her upper extremities.  There is no focal motor  weakness.  Neck is supple without thyromegaly or adenopathy.  Cardiac  exam has a regular rate and rhythm.  Normal S1 and S2.  There is no  gallop or murmur.  Lungs are clear with no wheezing bilaterally.  Extremities are without clubbing, cyanosis, or edema.   IMAGING:  Chest x-ray is stable with right upper lung surgical changes  but no active disease.   IMPRESSION:  The patient is a 77 year old woman followed with multiple  small pulmonary nodules but no evidence for progressionShe will continue  to be followed with an annual chest x-ray.   She has great concerns about her diagnosis of Parkinson's and the  medications she is on.  She seems to be having a lot of side effects  from that and she asked for a recommendation for a neurologist to give  her a second opinion.  I recommended to her Dr. Lesia Sago and we will  see if we can get  her an appointment to see him.  I will plan to see her  back in a year with a chest x-ray.  We did not make any medication  changes today.   Salvatore Decent Dorris Fetch, M.D.  Electronically Signed   SCH/MEDQ  D:  05/13/2009  T:  05/13/2009  Job:  [id number]   cc:   Melvyn Novas, MD

## 2010-11-27 NOTE — Procedures (Signed)
[place]  Patient:    Erin [name], Erin [name]                      MRN: [id number] Proc. Date: 12/04/99 Adm. Date:  [id number] Disc. Date: [id number] Attending:  Judeth Cornfield                           Procedure Report  PROCEDURE:  Esophageal manometry.  ENDOSCOPIST:  Barbette Hair. Arlyce Dice, M.D.  ANESTHESIA:  INDICATIONS:  The patient has had severe gastroesophageal reflux symptoms.  DESCRIPTION OF PROCEDURE:  Esophageal manometry was performed in the usual pull through technique.  FINDINGS: 1. Upper esophageal sphincter pressure and contractions were normal. 2. Throughout the body of the esophagus, there were normal peristaltic    contractions. 3. LES resting pressure was low at 5.2 mm.  Relaxation was normal.  IMPRESSION:  Normal esophageal manometry with a low resting lower esophageal sphincter pressure.  RECOMMENDATIONS:  Consideration of a fundoplication. DD:  12/04/99 TD:  12/09/99 Job: [id number]

## 2010-11-27 NOTE — Discharge Summary (Signed)
NAMEALEXCIS, BICKING               ACCOUNT NO.:  [id number]   MEDICAL RECORD NO.:  [id number]          PATIENT TYPE:  INP   LOCATION:  A340                          FACILITY:  [facility]   PHYSICIAN:  Vickki Hearing, M.D.DATE OF BIRTH:  09-13-1929   DATE OF ADMISSION:  02/12/2005  DATE OF DISCHARGE:  08/05/2006LH                                 DISCHARGE SUMMARY   ADMISSION DIAGNOSIS:  Comminuted fracture of left distal radius, possible  open fracture.   DISCHARGE DIAGNOSIS:  Comminuted fracture of left distal radius with open  fracture.   PROCEDURE:  Closed reduction and external fixation with percutaneous  pinning.   HOSPITAL COURSE:  She is 77 years old.  She fell on some water and sustained  a comminuted fracture of the left distal radius and was admitted for  surgery.  She had surgery.  She was kept for pain control.  Once the pain  was managed over night, the next day she was discharged.  She was afebrile  and her hand looked good with minimal swelling.  Of note, her potassium was  low.  She was hypokalemic preop and was treated with runs of oral potassium  and improved to 3.8.  Potassium repeated on postop day #1, and was found to  be normal.   DISCHARGE MEDICATIONS:  Darvocet for pain.   FOLLOW UP:  Follow up within a week.   DISPOSITION:  Home.  On arrival to the office, she is to have radiographs  and dressing change.       SEH/MEDQ  D:  02/15/2005  T:  02/15/2005  Job:  [id number]

## 2010-11-27 NOTE — H&P (Signed)
Erin [name]               ACCOUNT NO.:  [id number]   MEDICAL RECORD NO.:  [id number]          PATIENT TYPE:  AMB   LOCATION:  DAY                           FACILITY:  [facility]   PHYSICIAN:  Vickki Hearing, M.D.DATE OF BIRTH:  December 20, 1929   DATE OF ADMISSION:  DATE OF DISCHARGE:  Erin [name]                                HISTORY & PHYSICAL   CHIEF COMPLAINT:  Deformity left wrist.   HISTORY:  Erin [name] is status post closed reduction external fixation and  percutaneous pinning left distal radius and ulna fracture on February 12, 2005, followed by open bone grafting with Hydrocet on March 09, 2005.  She  then developed carpal tunnel syndrome, had a carpal tunnel release and did  well until approximately 4-6 months ago, when she developed deformity of the  left upper extremity and pain.  On evaluation, she has an apex volar  deformity of 30 degrees and refracture or breakdown of fibrous union of the  left radius.  She presents for surgical treatment to involve open reduction  internal fixation and bone grafting.   PAST MEDICAL HISTORY:  1. Pneumonia.  2. Osteoporosis.  3. Previous cardiac cath May 2001, Dr. Daleen Squibb, that was normal.  4. History of gastroesophageal reflex.  5. Bronchitis in 1989.  6. History of UTI.  7. History of constipation.   PAST SURGICAL HISTORY:  Include the above plus  1. Bilateral mastectomy and breast augmentation reconstruction.  2. Hysterectomy.  3. Cholecystectomy.  4. Tonsillectomy.  5. Right hand surgery secondary to trauma in 1996.  6. Status post right shoulder fracture.   FAMILY HISTORY:  Remarkable for diabetes and hypertension.  There is a  history of Alzheimer's disease.  She has a sister who had renal cancer,  another sister with breast cancer, a third sister with colon cancer.   SOCIAL HISTORY:  She is widowed.  She lives with a supportive family and  son.  She has retired from [employer]  Denies smoking or use of   alcohol.   ALLERGIES:  She is allergic to CODEINE which causes a rash.  She gets  nauseous after ANESTHESIA as well as after taking DILAUDID.   PHYSICAL EXAMINATION:  GENERAL APPEARANCE:  Development, nutrition, grooming  and hygiene are normal.  VITAL SIGNS:  Vital signs will be recorded at the preop visit and at the  time of surgery.  She has normal peripheral pulses.  No swelling, varicosities, temperature  changes, edema or tenderness.  CHEST:  Clear.  HEART:  Rate and rhythm normal.  ABDOMEN:  Soft.   Her left upper extremity shows that there is palpable pain at the end of the  distal fragment of the radius her skin and soft tissue obscure this  deformity, but on deep palpation, the deformity can be palpated.  Surprisingly her flexion/extension of the wrist joint itself is normal,  pronation and supination are compromised.  Strength in terms of grip is  weakened and there is no motion at the fracture site.   There is a volar skin incision from the carpal tunnel release.  NEUROLOGIC:  Normal sensation, the carpal tunnel symptoms were resolved with  surgery.  She is oriented x3.  Mood and affect slightly anxious.  Reflexes  are normal.   Radiographs show angular deformity of the distal radius, nonunion versus  refracture as noted as well.  There is Hydrocet still present at the  fracture site which will have to be removed.   PLAN:  Open treatment internal fixation of the left upper extremity.   I would like to use the VariAx Plating System from [employer].  We may go volar  and dorsal depending on evaluation after the patient is asleep.      Vickki Hearing, M.D.  Electronically Signed     SEH/MEDQ  D:  03/07/2006  T:  03/07/2006  Job:  [id number]

## 2010-11-27 NOTE — Op Note (Signed)
NAMECHARLES, NIESE               ACCOUNT NO.:  [id number]   MEDICAL RECORD NO.:  [id number]          PATIENT TYPE:  AMB   LOCATION:  DAY                           FACILITY:  [facility]   PHYSICIAN:  Vickki Hearing, M.D.DATE OF BIRTH:  [birth year]   DATE OF PROCEDURE:  08/06/2005  DATE OF DISCHARGE:                                 OPERATIVE REPORT   PREOPERATIVE DIAGNOSIS:  Carpal tunnel syndrome, left upper extremity.   POSTOPERATIVE DIAGNOSIS:  Carpal tunnel syndrome, left upper extremity.   PROCEDURE:  Carpal tunnel release, exploration of forearm, removal of bone  spur from radius.   SURGEON:  Dr. Romeo Apple. No assistance.   ANESTHESIA:  Regional block.   OPERATIVE FINDINGS:  Carpal tunnel syndrome was caused by pressure on the  median nerve from a bone spur from previous bone grafting. Some of the  material found was HydroSet bone graft material, and this was removed as  well.   INDICATIONS FOR PROCEDURE:  Pain and paresthesias of the median nerve with  feeling of a mass in the distal forearm.   The patient was identified as [employer]. She marked her left arm as the  surgical site. This was countersigned by the surgeon. She was given  antibiotics, taken to the operating room for a bier block. After bier block  was established, her left upper extremity was prepped and draped in a  sterile fashion. The OR team then took a time out and completed that as  required. An incision was made over the carpal tunnel and extended across  the wrist crease in a zigzag fashion and then in the distal forearm.  Subcutaneous tissues were divided. Palmaris longus was protected. Carpal  tunnel release was performed by releasing the transverse carpal ligament.  Flexor retinaculum was released, and the median nerve was explored  throughout its path through the forearm. After moving the medial nerve  radially, the prominent mass area was found to be HydroSet material and bone  from the distal  radius and the bone graft. This was removed, and smooth  contour was noted. The wound was closed with Monocryl and 3-0 nylon sutures.  We injected 10 cc of plain Marcaine. That was tolerated well. No  complications. The patient's wound was dressed  sterilely. She was taken to the recovery room in stable condition after the  tourniquet was released. She was discharged on Darvocet-N 100 one to two  q.4h. #60. We gave three refills. Followup will be on Monday for dressing  change.      Vickki Hearing, M.D.  Electronically Signed     SEH/MEDQ  D:  08/06/2005  T:  08/06/2005  Job:  [id number]

## 2010-11-27 NOTE — H&P (Signed)
[place]  Patient:    Erin [name], Erin [name]                        MRN: [id number] Attending:  Salvatore Decent. Dorris Fetch, M.D. Dictator:   Reed Pandy. Melvyn Neth, P.A.-C CC:         Dr. Ouida Sills             CVTS office                         History and Physical  DATE OF BIRTH:  [birth year].  CHIEF COMPLAINT:  Right lung nodule.  HISTORY OF PRESENT ILLNESS:  This is a pleasant 77 year old white female who was recently diagnosed with a right lung nodule.  She returned from a trip to [place] and had a productive cough, with yellow and green sputum.  She went to her primary medical doctor, and was diagnosed with pneumonia.  A chest x-ray showed a right lung nodule.  Subsequent CT scan showed multiple nodules: the largest was 1 cm in diameter.  There was also a 4-5 mm nodule in her left lung.  There was no mediastinal or hilar adenopathy per CT scan.  She was subsequently referred to CVTS - she saw Dr. Dorris Fetch at the office on 01/27/00; he recommended bronchoscopy and right VATS wedge resection and possible thoracotomy, pending on the frozen pathology.  Since 01/27/00, Erin [name] has had no significant changes with regard to her condition.  She has had no increasing shortness of breath but her cough remains. She had a recent urinary tract infection and is currently taking Bactrim.  PAST MEDICAL HISTORY: 1. Recent pneumonia. 2. Osteoporosis. 3. Cardiac catheterization in May, 2001; by Dr. Daleen Squibb - was normal.  This was done    secondary to dyspnea on exertion. 4. No history of CAD. 5. GERD. 6. No prior history of malignancy. 7. No prior history of TB.  SURGERIES: 1. Bilateral breast implants after a bilateral mastectomy, done secondary to    frequent breast cysts. 2. Hysterectomy. 3. Cholecystectomy. 4. Tonsillectomy. 5. Right hand surgery, secondary to trauma (Erin [name] fell at work in 1996,    fracturing her right shoulder, elbow, right knee and  right hand). 6. Erin [name] was scheduled for a Nissen fundoplication before her current problem    began - this has been put on hold.  FAMILY HISTORY:  Mother has diabetes and hypertension.  Father deceased, age 39, secondary to Alzheimers disease.  One sister deceased, with emphysema and renal  cancer.  Another sister with breast cancer (doing well).  A third sister with colon cancer (doing well).  SOCIAL HISTORY:  She is widowed, with two healthy boys.  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  She denies smoking or alcohol use.  MEDICATIONS:  Zocor 20 mg p.o. q.d., Aciphex 20 mg p.o. q.d., E-Vista 60 mg p.o. q.d. and Bactrim 800/160 mg b.i.d. x 7 days (started on 02/08/00).  ALLERGIES:  CODEINE causes a rash.  She also gets nauseated from anesthesia.  REVIEW OF SYSTEMS:  She had bronchitis in the past.  She had an episode of hemoptysis in 1989; unknown etiology.  She had pneumonia, as indicated in the HPI. She denies asthma or COPD.  She denies renal disease.  She denies bloody stools. She has frequent UTIs.  She has constipation.  She denies a  history of stroke. She denies diabetes.  She denies liver problems.  OBJECTIVE DATA:  FEV1 was 1.44, FVC was 1.59.  PHYSICAL EXAMINATION:  VITAL SIGNS:  Blood pressure 140/84, pulse 78 and regular, respirations 18.  HEENT:  Normocephalic, atraumatic.  Pupils equal, round and reactive to light and accommodation; extraocular movements intact.  She wears glasses.  Oropharynx is  grossly normal.  She has dentures.  NECK:  Supple, with no JVD, bruits, thyromegaly or lymphadenopathy.  CHEST:  Symmetrical with no wheezes, rhonchi or rales.  LUNGS:  Clear throughout.  CARDIOVASCULAR:  Shows a regular rate and rhythm; S1 and S2, with no murmur.  ABDOMEN:  Soft, nontender/nondistended, with positive bowel sounds throughout.  There were no abdominal bruits  heard.  EXTREMITIES:  Show varicosities bilaterally with 1+ nonpitting edema.  She has palpable pulses in her lower extremities.  Extremities appear warm, pink and well- perfused.  NEUROLOGIC:  Cranial nerves II-XII are grossly intact.  She has normal affect.  Gait is normal.  Deep tendon reflexes are +2/4.  Muscular strength is +5/5 throughout; full range of motion throughout.  There are no neurological deficits on exam.  She has normal sensation in her lower extremities.  ASSESSMENT/PLAN:  This is a pleasant 77 year old white female with a long history of secondhand smoke exposure and recently discovered lung nodules, with predominant nodule in the right midlung, approximately 1 cm in diameter.  Dr. Dorris Fetch evaluated the CT scan and [name].  His differential diagnosis included fungal or microbacterial disease, reactive lymph nodes secondary to recent pneumonia, primary lung cancer with or without parenchymal metastasis or metastatic cancer.  Metastatic cancer is unlikely.  The definitive way to determine the etiology is excisional biopsy and formal pathology.  Dr. Dorris Fetch recommended bronchoscopy and VATS wedge resection of the largest nodule and possible thoracotomy depending on frozen section results.  He discussed the risks, benefits, details and alternatives of surgery with Erin [name] and she agreed to proceed.  Surgery is tentatively scheduled for 02/15/00. Erin [name] is currently clinically stable. DD:  02/11/00 TD:  02/11/00 Job: [id number]

## 2010-11-27 NOTE — Op Note (Signed)
NAMEPORTIA, WISDOM               ACCOUNT NO.:  [id number]   MEDICAL RECORD NO.:  [id number]          PATIENT TYPE:  AMB   LOCATION:  DAY                           FACILITY:  [facility]   PHYSICIAN:  Vickki Hearing, M.D.DATE OF BIRTH:  August 03, 1929   DATE OF PROCEDURE:  03/09/2005  DATE OF DISCHARGE:                                 OPERATIVE REPORT   HISTORY:  This is a 77 year old female who fractured her left distal radius,  had a closed reduction, percutaneous pinning, and external fixator placed  approximately 2-1/2 weeks ago. In the postoperative period, CT scan was  obtained to assess large comminuted area with subsequent gapping at the  fracture site, enlarged metaphyseal defect. At that point, it was decided  that the patient should undergo bone grafting.   PREOPERATIVE DIAGNOSIS:  Left distal radius fracture.   POSTOPERATIVE DIAGNOSIS:  Left distal radius fracture.   PROCEDURE:  Open bone grafting left distal radius with HydroSet bone graft  substitute from [employer].   SURGEON:  Dr. Romeo Apple. No assistants.   ANESTHETIC:  Bier block.   OPERATIVE FINDINGS:  Large metaphyseal defect, fibrous healing occurring at  the fracture site.   The patient identified in preop holding area as [employer]. Left wrist  and hand marked as the surgical site. The patient's history and physical was  updated. She was taken to the operating room after she was given Ancef.  After successful Bier block, her left upper extremity was prepped and draped  in sterile fashion.   A time-out was taken as required. Using [place] mini C-arm, we made an incision  over the metaphyseal defect, did a subperiosteal exposure of the defect, and  we encountered the fibrous tissue growing in the fracture site. We debrided  this and injected the HydroSet and allowed it to cure, irrigated the wound  and closed with 2-0 Monocryl and 3-0 nylon. The tourniquet was released. The  hand was viable. Sterile dressings were  applied. The patient was taken  recovery in stable condition      Vickki Hearing, M.D.  Electronically Signed     SEH/MEDQ  D:  03/10/2005  T:  03/10/2005  Job:  [id number]

## 2010-11-27 NOTE — Op Note (Signed)
[name], [name]               ACCOUNT NO.:  [id number]   MEDICAL RECORD NO.:  [id number]          PATIENT TYPE:  AMB   LOCATION:  DAY                           FACILITY:  [facility]   PHYSICIAN:  Vickki Hearing, M.D.DATE OF BIRTH:  07-Oct-1929   DATE OF PROCEDURE:  03/11/2006  DATE OF DISCHARGE:                                 OPERATIVE REPORT   PREOPERATIVE DIAGNOSIS:  Malunited left distal radius fracture.   POSTOPERATIVE DIAGNOSIS:  Fibrous malunion left distal radius fracture   PROCEDURE:  Open treatment internal fixation with DBX bone graft putty and  [employer] VariAx dorsal plate.   SURGEON:  Vickki Hearing, M.D.   ANESTHETIC:  Bier block   ASSISTANT:  Forde Dandy.   PRIMARY INDICATION:  Deformity and pain.   This is a 77 year old female who had closed reduction external fixation,  percutaneous pinning of left distal radius fracture followed by Hydrocet  grafting and removal of fixator, pins and casting.   Did well for about 6 months and then developed deformity and pain in the  left distal radius, presented with a 30 degree angular deformity between the  proximal and distal fragments.   The patient identified in the preoperative holding area as Erin [name].  Her left wrist was marked by her for surgery, countersigned by the surgeon.  History and physical updated.  Antibiotic started.  The patient taken to the  operating room for Bier block.  Sterile prep and drape was done on left  distal radius and arm.   After time-out procedure was completed, procedure was confirmed as left  distal radius open treatment internal fixation with bone grafting on [facility].   A straight incision was made over Lister's tubercle, extended proximally and  distally.  Subcutaneous tissue was divided, extensor pollicis longus tendon  was retracted radially along with the second compartment musculature, fourth  compartment was opened and retracted ulnarly.  Subperiosteal  dissection  exposed the fibrous nonunion which was taken down.  Fibrous tissue was  removed until the fracture fragments were mobile.  An open reduction was  performed, confirmed to be satisfactory on C-arm pictures and the a  reduction was held with a bone clamp.  The gap was grafted with bone putty  and a volar plate modified to fit the dorsal radius was applied using AO  technique with  a combination of locking and cortical screws.  The wound was then irrigated  and closed with 2-0 Monocryl, staples injected 20 mL of Sensorcaine plain  and a splint was applied with underlying sterile dressings.   OPERATIVE FINDINGS:  Fibrous malunion.      Vickki Hearing, M.D.  Electronically Signed     SEH/MEDQ  D:  03/11/2006  T:  03/11/2006  Job:  [id number]

## 2010-11-27 NOTE — Group Therapy Note (Signed)
Erin [name], Erin [name]               ACCOUNT NO.:  [id number]   MEDICAL RECORD NO.:  [id number]          PATIENT TYPE:  INP   LOCATION:  A340                          FACILITY:  [facility]   PHYSICIAN:  Hanley Hays. Dechurch, M.D.DATE OF BIRTH:  1930/02/25   DATE OF PROCEDURE:  02/12/2005  DATE OF DISCHARGE:                                   PROGRESS NOTE   This is a 77 year old Caucasian female followed by Dr. Oval Linsey with  a past medical history of hypertension, osteoporosis, multiple falls with  orthostasis, and recurrent UTI, and chronic constipation, who presents to  the emergency room after a fall at home when she slipped on water on her  floor.  She fell to her left side and sustained a comminuted interarticular  left wrist fracture with significant angulation.  She is admitted to the  hospital under the services of Dr. Romeo Apple for orthopaedic treatment.  We  have been consulted to follow her perioperatively.  The patient is alert and  oriented; able to get adequate history.   PAST MEDICAL HISTORY:  1. Remarkable for hypertension.  2. Apparently, she has had some recent additions to her medications over      the last couple of months, and is now well-controlled.  3. She has a history of normal coronary artery catheterization in May      2001, secondary to chest pain.  4.  Gastroesophageal reflux.  4. Chronic constipation.  5. Hemorrhoids.  6. She is status post hysterectomy, cholecystectomy, tonsillectomy,      hemorrhoidectomy times two, right hand surgery post trauma.  7. History of lung nodule, benign at biopsy.  8. Status post bilateral mastectomy due to fibrocystic disease, with      bilateral breast implants, remote.  9. History of pneumonia.  10.History of UTI and renal cysts.   MEDICATIONS:  1. Benicar 40/12.5.  2. Toprol 100 daily.  3. Aciphex 20 daily.  4. Antivert 12.5 t.i.d.  5. Verapamil 240 daily.  6. Kay Ciel 20 daily.  7. Hydrochlorothiazide 12.5  daily.  8. Bactrim DS q.d. p.r.n.   PAST MEDICAL HISTORY:  Pertinent for diabetes, hypertension, COPD, and  breast and renal cell carcinoma, as well as colon cancer.   SOCIAL HISTORY:  She is widowed.  No alcohol or tobacco abuse.  She has two  children, who are very supportive.   ALLERGIES:  1. CODEINE, WHICH RESULTS IN A RASH.  2. SHE HAS HAD A HISTORY OF NAUSEA ASSOCIATED ANESTHESIA AND DILAUDID.   REVIEW OF SYSTEMS:  She is independent.  She notes multiple falls, usually  associated with orthostasis upon getting up; usually in the evenings.  Fortunately, no recent injuries until today; although this was secondary to  slipping.  She denies any GU complaints.  She notes some pain in her left  chest wall with deep inspirations since her fall.  She sleeps well.  Chronic  constipation requiring every other day Fleet suppository.  Appetite is  usually good.  She did take her medications this morning.   PHYSICAL EXAMINATION:  Reveals and obese female in no  distress.  Blood  pressure is documented at 100/60, pulses are 64 and regular.  Respirations  are unlabored.  Oropharynx is moist.  Nonfocal.  Her left wrist is splinted,  but she is able to move her fingers.  The lungs are diminished, but clear  bilaterally.  Her heart is regular rate and rhythm, no murmurs, rubs, or  gallops.  The abdomen is obese, soft and nontender.  Extremities without  clubbing, cyanosis, or edema.  She has tenderness at the left ankle, but no  bruising or swelling.  The chest wall reveals no tenderness to palpation or  crepitance.  There is no bruising noted.  Skin is otherwise without  significant rash, lesion, or breakdown.   ASSESSMENT AND PLAN:  1. Comminuted left wrist fracture, for operative fixation this afternoon.      She is hypokalemia with a K at 3.  She is receiving IV and p.o.      supplements.  Magnesium will be obtained as well.  She may need      increased supplements as an outpatient.  2.  Constipation, which may be a problem postoperatively.  We discussed at      length bowel regimens, and the importance of such.  I agree with the      Senokot.  MiraLax may provide some increased volume, which will be      helpful for her hemorrhoids.  We will consider Citrucel and Senokot.  3. Hypertension.  Her blood pressure is on the low side today.  She did      take her medications.  I suspect some of her orthostatic symptoms are      related to her medications.  It may be reasonable to back off.  We will      see if we can obtain some record from the office evaluation, because      her risk of falls is quite high with her orthostasis.  4. Osteoporosis.  She is not on any vitamin D or calcium.  5. History of recurrent urinary tract infection, usually 1-2 per year.      She takes p.r.n. Bactrim.  I am not sure that this is really providing      any assistance.  Given her history, she may be a candidate for      cranberry prophylaxis.   Thank you for this consult.  We will be happy to follow along with you  during her perioperative period.       FED/MEDQ  D:  02/12/2005  T:  02/12/2005  Job:  [id number]

## 2010-11-27 NOTE — Op Note (Signed)
NAMEMAGARET, JUSTO               ACCOUNT NO.:  [id number]   MEDICAL RECORD NO.:  [id number]          PATIENT TYPE:  INP   LOCATION:  A340                          FACILITY:  [facility]   PHYSICIAN:  Vickki Hearing, M.D.DATE OF BIRTH:  07-Apr-1930   DATE OF PROCEDURE:  02/12/2005  DATE OF DISCHARGE:  02/13/2005                                 OPERATIVE REPORT   HISTORY:  This is a 77 year old female who slipped on some water in her home  on August 4, fractured her left wrist, appeared to be an open fracture with  significant angulation and comminution. She was admitted and brought to  surgery on the day of injury.   PREOPERATIVE DIAGNOSIS:  Comminuted open fracture, grade 1, left distal  radius.   POSTOPERATIVE DIAGNOSIS:  Comminuted open fracture, grade 1, left distal  radius.   PROCEDURE:  Open treatment internal fixation, irrigation of wound, left  wrist, percutaneous pinning.   SURGEON:  Dr. Romeo Apple.   ANESTHESIA:  Anesthetic was by axillary block.   OPERATIVE FINDINGS:  There was an inside-out puncture wound approximately 2  mm on the volar aspect of the wrist. There was a comminuted fracture of the  distal radius, and there was a fracture of the ulna. There was significant  comminution and displacement. The procedure was done in the following  manner:   She was identified as Ladoris Gene in the preop holding area. Her left  upper extremity was marked as the surgical site, countersigned by the  surgeon. She was given preoperative antibiotics and then taken to the  operating room. She had an axillary block in the preop holding area. After  sterile prep and drape, a time-out was taken and completed as required.   The fracture was manipulated with straight manual traction, and a radiograph  was obtained. A reasonable reduction was obtained, and we applied an  external fixator. We placed an incision over the second metacarpal, divided  the subcu tissue, subperiosteally  exposed the bone and placed to two 1/2  pins. We did the same thing in the distal third of the radius and applied  the fixator with the same straight manual traction, locked the fixator and  accepted the reduction which we received. We then percutaneously pinned the  ulna with a longitudinal pin and a cross pin to the distal radius. We were  able to reestablish length and radial inclination. There was a large  metaphyseal defect which may require bone graft in the future. This was  communicated to the patient.   The wounds were closed with 3-0 nylon. Sterile dressings were applied. A  tourniquet which had been elevated after exsanguination of limb with a 3-  inch Esmarch was released. Fingers had good capillary refill and viability.  The patient was taken recovery room in stable condition.       SEH/MEDQ  D:  02/15/2005  T:  02/15/2005  Job:  [id number]

## 2010-11-27 NOTE — H&P (Signed)
Erin [name], Erin [name]               ACCOUNT NO.:  [id number]   MEDICAL RECORD NO.:  [id number]          PATIENT TYPE:  AMB   LOCATION:  DAY                           FACILITY:  [facility]   PHYSICIAN:  Vickki Hearing, M.D.DATE OF BIRTH:  [birth year]   DATE OF ADMISSION:  DATE OF DISCHARGE:  LH                                HISTORY & PHYSICAL   CHIEF COMPLAINT:  Fractured left wrist.   HISTORY:  This is a 77 year old female who fell on February 12, 2005.  Had an  external fixation and percutaneous pinning of the left distal radius in the  perioperative period.  Radiographs reveal there was a large metaphyseal  defect.  A CT scan was obtained to better define it, and she will need bone  grafting of the defect.   PAST HISTORY:  1.  Pneumonia.  2.  Osteoporosis.  3.  Cardiac catheterization in May of 2001 by Dr. Daleen Squibb, which was normal.  4.  There is a history of gastroesophageal reflux.  5.  She has had bilateral mastectomies and implants.  6.  Hysterectomy.  7.  Cholecystectomy.  8.  Tonsillectomy.  9.  Right hand surgery secondary to trauma in 1996.  10. Right shoulder fracture.  11. Bronchitis with hemoptysis in 1989.  12. Frequent history of UTI.  13. Constipation.   FAMILY HISTORY:  Diabetes and hypertension.  History of Alzheimer's disease  in the family.  History of renal cancer in a sister.  Another sister with  breast cancer.  A third sister with colon cancer.   SOCIAL HISTORY:  She is widowed.  Lives with her son.  Retired from a  tobacco company.  Denies smoking or alcohol.   ALLERGIES:  1.  CODEINE causes rash.  2.  Gets nauseated after ANESTHESIA.  3.  Gets nauseated after DILAUDID.   PHYSICAL EXAMINATION:  GENERAL:  She is awake, alert, and oriented x3.  Mood  and affect normal.  Sensation is normal including injured extremity.  CARDIAC:  No abnormality.  SKIN:  Normal, including the surgical site.  LYMPH SYSTEM:  No abnormality.  CHEST:  Clear.  ABDOMEN:   Soft.  EXTREMITIES:  Left upper extremity has clinically good alignment with some  tenderness over the fracture site.  Radiographs show a metaphyseal defect,  which will require bone grafting.   IMPRESSION:  Fractured left distal radius.   PLAN:  Open bone grafting, left distal radius.      Vickki Hearing, M.D.  Electronically Signed     SEH/MEDQ  D:  03/09/2005  T:  03/09/2005  Job:  [id number]

## 2010-11-27 NOTE — Op Note (Signed)
[facility]  Patient:    Erin [name], Erin [name]                      MRN: [id number] Proc. Date: 02/15/00 Adm. Date:  [id number] Attending:  Charlett Lango CC:         Ellie Lunch, M.D.  Carylon Perches, M.D.   Operative Report  PREOPERATIVE DIAGNOSIS:  Right lung nodule.  POSTOPERATIVE DIAGNOSIS:  Granuloma.  PROCEDURE: 1. Bronchoscopy. 2. Right VATS. 3. Right upper lobe wedge resection.  SURGEON:  Salvatore Decent. Dorris Fetch, M.D.  Threasa HeadsMelvyn Neth.  ANESTHESIA:  General.  FINDINGS:  Pleural membrane extending from the major fissure to the lateral chest wall on the entire length of the major fissure essentially separating the pleural space into two separate compartments.  A 1 cm nodule at the inferior aspect of the right upper lobe, frozen section consistent with granuloma, no other palpable nodules.  CLINICAL NOTE:  Erin [name] is a 77 year old female who presents for evaluation of multiple pulmonary nodules.  She has undergone a chest x-ray after a recent bout of pneumonia which showed questionable right upper lobe nodule.  She then underwent a CT scan where she was seen to have a 1 cm nodule in the right upper lobe.  There was scarring around the major fissure, and there were two additional tiny nodules approximately 2 to 3 mm in diameter in the right lung and a similar nodule in the left lung, again 2 to 3 mm in diameter.  The patient is referred for evaluation, and was advised to undergo VATS with biopsy of the dominant nodule.  She understood that it might be necessary to proceed with a lobectomy if it turned out to be cancerous.  She also understood the need for bronchoscopy to rule out endobronchial lesions. The indications, risks, benefits, and alternatives were discussed in detail with the patient.  She understood and agreed to proceed.  DESCRIPTION OF PROCEDURE:  Erin [name] was brought to the preoperative holding area on  February 15, 2000.  Arterial blood pressure monitoring catheter was placed.  Intravenous access was obtained.  The patient was taken to the operating room, anesthetized, and intubated with a single lumen endotracheal tube.  Flexible fiberoptic bronchoscopy was carried out via the single lumen tube and a standard bronchoscope.  Bronchoscopy revealed normal endobronchial anatomy and no endobronchial lesions to the level of the subsegmental bronchi.  The patient then was reintubated with a double lumen endotracheal tube.  She was placed in the left lateral decubitus position, and the right chest was prepped and draped in the usual fashion.  Incision was made in the seventh intercostal space in the mid axillary line, deepened through the skin and subcutaneous tissue.  The chest was entered bluntly using hemostats.  Single lung ventilation of the left lung was being carried out at this time.  A port was inserted through the incision and thoracoscope was inserted into the chest.  Initial placement of the thoracoscope revealed a very small pleural cavity.  There were extensive adhesions medially.  This had a very unusual appearance.  An additional incision was made posteriorly in the fifth intercostal space, and the chest was entered under thoracoscopic visualization.  Cautery then was used to take down the adhesions in the midline, and it became apparent that this was not truly scar tissue, but it was actually a pleural reflection extending from the major fissure along its entire length to the lateral  chest wall which had effectively separated the right pleural space into two separate pleural spaces, one containing the left lower lobe and one containing the upper and middle lobe.  Once this membrane was taken down a portion of it was sent for pathologic examination, although it did not appear cancerous.  An additional incision then was made in the fifth intercostal space anteriorly.  A finger was  inserted into the chest and the upper lobe was palpated.  There was a palpable nodule on the inferior aspect of the right upper lobe which matched the location of the nodule seen on chest CT.  A wedge resection then was performed of this area of the lung, including the nodule, with multiple sequential firings of a 45 mm Endo GIA stapler.  The specimen was withdrawn from the chest and palpated, the nodule was clearly within the specimen with the 1 cm margin.  The lung then was sent to pathology for frozen section examination.  Frozen section revealed granulomatous disease, no evidence of cancer.  While the frozen section was being performed the staple line was inspected and had good hemostasis.  The remainder of the upper lobe was palpated as possible through the thoracoscopy incisions.  There were no other palpable nodules that could be located.  The chest was irrigated.  A 28 French chest tube was placed through the seventh interspace incision and directed to the apex using thoracoscopic visualization.  All port sites were inspected and there was good hemostasis. The right lung was reinflated.  The scope was withdrawn.  Chest tube was secured with a #1 silk suture.  The remaining two incisions were closed with 0 Vicryl fascial suture, 3-0 Vicryl subcutaneous suture, and a 4-0 monocryl subcuticular suture.  The chest tube was placed to suction.  The patient was placed back in supine position, and was extubated in the operating room and taken to the postanesthetic care unit in stable condition.  All sponge, needle, and instrument counts were correct at the end of the procedure.  There were no intraoperative complications. DD:  02/15/00 TD:  02/16/00 Job: [id number]

## 2010-11-27 NOTE — Op Note (Signed)
[facility]  Patient:    KANDEE, ESCALANTE                      MRN: [id number] Proc. Date: 11/25/99 Adm. Date:  [id number] Disc. Date: [id number] Attending:  Judeth Cornfield CC:         Carylon Perches, M.D.                           Operative Report  PROCEDURE:  Endoscopy.  HISTORY:  Mrs. Kleckner has had severe burning chest discomfort.  She has longstanding reflux symptoms.  She regurgitates gastric contents.  She now has chronic hoarseness.  This is despite therapy with Aciphex daily.  The test is performed for further evaluation, with anticipation of a fundoplication procedure.  INFORMED CONSENT:  The patient provided consent after risks, benefits and alternatives were explained.  MEDICATIONS:  Versed 4 mg, fentanyl 50 mg, Robinul 0.2 mg IV. Cetacaine spray.  DESCRIPTION OF PROCEDURE:  The patient was placed in the left lateral decubitus position, administered continuous low-flow oxygen and was placed on pulse oximetry.  The Olympus videogastroscope was inserted under direct vision to the oropharynx and esophagus.  FINDINGS: 1. In the lower esophagus and proximity to the GE junction there were    spotty areas of erythema.  The Z-line was at 40 cm. 2. Normal proximal esophagus, stomach and duodenum.  IMPRESSION:  Esophagitis.  RECOMMENDATIONS: 1. Continue Protonix 40 mg b.i.d. 2. Esophageal menometry.  If normal, I would recommend a fundoplication. DD:  11/25/99 TD:  11/28/99 Job: [id number]

## 2010-11-27 NOTE — H&P (Signed)
NAMEPAOLINA, Erin [name]               ACCOUNT NO.:  [id number]   MEDICAL RECORD NO.:  [id number]          PATIENT TYPE:  INP   LOCATION:  A340                          FACILITY:  [facility]   PHYSICIAN:  Vickki Hearing, M.D.DATE OF BIRTH:  [birth year]   DATE OF ADMISSION:  02/12/2005  DATE OF DISCHARGE:  LH                                HISTORY & PHYSICAL   CHIEF COMPLAINT:  Left wrist pain.   HISTORY OF PRESENT ILLNESS:  This is a 77 year old female who slipped on  some water in her home today and fractured her left wrist.  It appears to be  an open fracture with significant comminution, displacement and angulation.  She complains of severe constant, sharp, aching pain in the left upper  extremity.  She denies any elbow or shoulder pain.  Denies any other trauma.  She is being admitted for external fixation of the wrist, cleaning of the  wound.  I have informed her that she will probably need two surgeries.   PAST MEDICAL HISTORY:  1.  History of pneumonia.  2.  Osteoporosis.  3.  Cardiac catheterization in May 2001, by Dr. Daleen Squibb, that was normal.  4.  No history of coronary artery disease.  5.  GERD.  6.  Bilateral breast implants after bilateral mastectomy done secondary to      breast cysts.  7.  Hysterectomy.  8.  Cholecystectomy.  9.  Tonsillectomy.  10. Right hand surgery secondary to trauma in 1996.  11. Right shoulder fracture.  12. Bronchitis with hemoptysis in 1989.  13. Frequent UTIs.  14. History of constipation causing her difficulty.   FAMILY HISTORY:  Diabetes and hypertension.  Father died at 45.  He had  Alzheimer's disease.  She has a deceased sister who had emphysema and renal  cancer.  Another sister had breast cancer, but did well.  Third sister had  colon cancer.  She is widowed and lives with her son.  Retired from [employer].  She worked there for 32 years.  Denies smoking or alcohol abuse.   ALLERGIES:  CODEINE causes a rash.  Gets nauseated  after ANESTHESIA and  DILAUDID.   PHYSICAL EXAMINATION:  GENERAL:  She is awake, alert and oriented x3.  Mood  and affect are normal.  She has normal sensation including any injured  extremity.  CARDIAC:  Normal.  SKIN:  Abnormal with a break in the skin thought to be from the fracture on  the left upper extremity.  LYMPHS:  Normal in cervical spine and supraclavicular areas.  CHEST:  No abnormality.  ABDOMEN:  No abnormality.  EXTREMITIES:  Left upper extremity is deformed.  There is a laceration on  the volar side.  There is crepitance, pain and tenderness at the fracture  site.   LABORATORY DATA AND X-RAY FINDINGS:  Radiographs included elbow films and  wrist films showing significant comminution intraarticular fracture,  significant angulation.   ASSESSMENT/PLAN:  Based on her bone quality and the principle of less is  more, I recommended external fixation followed by subsequent computed  tomography scan if  needed and reevaluation of the overall fracture alignment  and assess need for further surgery if necessary at a later day.       SEH/MEDQ  D:  02/12/2005  T:  02/12/2005  Job:  [id number]

## 2010-11-27 NOTE — Discharge Summary (Signed)
NAMENICOLET, GRIFFY               ACCOUNT NO.:  [id number]   MEDICAL RECORD NO.:  [id number]          PATIENT TYPE:  INP   LOCATION:  A340                          FACILITY:  [facility]   PHYSICIAN:  Vickki Hearing, M.D.DATE OF BIRTH:  09/24/1929   DATE OF ADMISSION:  02/12/2005  DATE OF DISCHARGE:  08/05/2006LH                                 DISCHARGE SUMMARY   Rene Gonsoulin is being kept for observation because of significant pain, and  attempt to obtain pain control.       SEH/MEDQ  D:  02/15/2005  T:  02/15/2005  Job:  [id number]

## 2010-11-27 NOTE — H&P (Signed)
Erin [name], Erin [name]               ACCOUNT NO.:  [id number]   MEDICAL RECORD NO.:  [id number]          PATIENT TYPE:  AMB   LOCATION:  DAY                           FACILITY:  [facility]   PHYSICIAN:  Vickki Hearing, M.D.DATE OF BIRTH:  [birth year]   DATE OF ADMISSION:  08/05/2005  DATE OF DISCHARGE:  LH                                HISTORY & PHYSICAL   CHIEF COMPLAINT:  Pain and paresthesias, left wrist pain, left forearm.   HISTORY OF PRESENT ILLNESS:  This 77 year old female fell February 12, 2005,  had closed reduction, external fixation and percutaneous pinning of the left  distal radius followed by bone grafting on August 29.  Did well.  Continues  to have some pain in her forearm and pain and paresthesias of the median  nerve confirmed by nerve conduction study.  She did receive one injection  for the carpal tunnel, but this did not improve her symptoms.  She has  history of pneumonia, osteoporosis, cardiac catheterization, history of  gastroesophageal reflux disease, bilateral mastectomies and implants,  hysterectomy, cholecystectomy, tonsillectomy, right hand surgery, right  shoulder surgery, bronchitis with hemoptysis, frequent history of UTI's,  constipation.   FAMILY HISTORY:  Diabetes, hypertension, Alzheimer's disease, cancer.   SOCIAL HISTORY:  She is widowed, lives with her son, retired from the  tobacco company.  Denies smoking or drinking.   ALLERGIES:  CODEINE CAUSES RASH, NAUSEA AFTER ANESTHESIA, NAUSEA AFTER  DILAUDID.   PHYSICAL EXAMINATION:  GENERAL APPEARANCE:  She is awake, alert and oriented  x3.  Mood and affect are normal.  She has decreased sensation in the median  nerve distribution.  CARDIAC:  Normal pulse perfusion and regular rate and rhythm heart sounds.  SKIN:  Previous surgical site.  Dorsal distal pin site becomes  intermittently red, not red at this time.  LYMPHS:  Benign.  CHEST:  Clear.  ABDOMEN:  Soft.  EXTREMITIES:  Left upper extremity  is prominent in the forearm which I  believe is part of fractured callous and/or Hydrocet bone graft.  There is  tenderness over the carpal tunnel and positive Tinel's sign as well.   IMPRESSION:  Fracture left distal radius with carpal tunnel syndrome  residual.   PLAN:  Open carpal tunnel release and exploration of the forearm mass.      Vickki Hearing, M.D.  Electronically Signed     SEH/MEDQ  D:  08/05/2005  T:  08/05/2005  Job:  [id number]

## 2010-11-27 NOTE — H&P (Signed)
[name], FIX               ACCOUNT NO.:  [id number]   MEDICAL RECORD NO.:  [id number]          PATIENT TYPE:  AMB   LOCATION:  DAY                           FACILITY:  [facility]   PHYSICIAN:  Vickki Hearing, M.D.DATE OF BIRTH:  10-24-1929   DATE OF ADMISSION:  DATE OF DISCHARGE:  LH                                HISTORY & PHYSICAL   HISTORY OF PRESENT ILLNESS:  Erin [name] is status post closed reduction,  external fixation, percutaneous pinning of the left distal radius fracture  on February 12, 2005, followed by open bone grafting with Hydrocet on March 09, 2005, and presents now with symptoms of carpal tunnel syndrome with  pain, tingling in the index and long finger, and prominence of the bone  graft and/or fracture callus in the volar aspect of the forearm.  This will  be removed, as well as the carpal tunnel release.   PAST MEDICAL HISTORY:  1.  Pneumonia.  2.  Osteoporosis.  3.  Status post cardiac catheterization in May of 2001 by Dr. Daleen Squibb that was      normal.  4.  History of gastroesophageal reflux.  5.  She had bronchitis with hemoptysis in 1989.  6.  Frequent history of urinary tract infections.  7.  History of constipation.   PAST SURGICAL HISTORY:  1.  Previous bilateral mastectomies with breast augmentation reconstruction.  2.  Hysterectomy.  3.  Cholecystectomy.  4.  Tonsillectomy.  5.  Right hand surgery secondary to trauma in 1996.  6.  Status post right shoulder fracture.   FAMILY HISTORY:  Remarkable for diabetes and hypertension.  There is a  history of Alzheimer's disease, as well, in the family.  She had a sister  who had renal cancer, and another sister with breast cancer.  A third sister  with colon cancer.   SOCIAL HISTORY:  She is widowed.  She lives with a supportive son.  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   ALLERGIES:  1.  CODEINE causes a rash.  2.  She gets nauseous after ANESTHESIA.  3.   She gets nauseous after DILAUDID.   PHYSICAL EXAMINATION:  GENERAL:  An awake and alert female.  Mood and affect  normal.  Sensation is intact, except for the index and long finger of the  involved left upper extremity.  She has tenderness over the carpal tunnel,  and there is a prominent bone fragment and/or bone graft material in the  volar aspect of the wrist.  CARDIOVASCULAR:  Normal.  SKIN:  Intact, except for the distal pin sites from the previous external  fixator, which, on occasion, will become inflamed and reddened.  LYMPHATIC SYSTEM:  Normal.  CHEST:  Clear.  ABDOMEN:  Soft.  EXTREMITIES:  Her left upper extremity alignment was good.  She had  excellent flexion and extension of the wrist.  Good pronation and  supination.  There was volar pain over the previously noted fracture.   IMPRESSION:  Carpal tunnel syndrome and pain, volar aspect of wrist.   PLAN:  Repeat carpal tunnel, explore and/or remove bone from previous injury  and fracture.      Vickki Hearing, M.D.  Electronically Signed     SEH/MEDQ  D:  07/27/2005  T:  07/27/2005  Job:  [id number]   cc:   [facility]

## 2010-11-27 NOTE — Discharge Summary (Signed)
[place]  Patient:    Erin [name], Erin [name]                      MRN: [id number] Adm. Date:  [id number] Disc. Date: 02/18/00 Attending:  Charlett [name] CC:         Erin [name]. Erin [name], M.D.   Discharge Summary  DATE OF BIRTH:  [birth year]  ADMISSION DIAGNOSIS:  Right lung nodule.  SECONDARY DIAGNOSES: 1. Osteoporosis. 2. Gastroesophageal reflux disease. 3. Recent history of pneumonia.  FINAL DIAGNOSES: 1. Benign right lung nodule. 2. Osteoporosis. 3. Gastroesophageal reflux disease. 4. Recent history of pneumonia.  ALLERGIES:  CODEINE causes a rash and she has experienced nausea in the past with ANESTHESIA.  HISTORY OF PRESENT ILLNESS:  This is a 77 year old white female who was recently diagnosed with a right lung nodule.  She returned from trip to [place] and had a productive cough with yellow and green sputum.  She was went to her primary medical doctor and was diagnosed with pneumonia.  He subsequently ordered a chest x-ray which showed a right lung nodule.  Subsequent CT scan showed multiple nodules.  The largest was 1 cm in diameter.  There was also a 4-5 mm nodule in her left lung.  There was no mediastinal or hilar adenopathy per CT scan.  She was next referred to [name], M.D., who saw her in the office on January 27, 2000.  He recommended bronchoscopy and right VATS wedge resection and possible thoracotomy pending frozen pathology.  HOSPITAL COURSE:  On February 15, 2000, Erin [name] underwent flexible bronchoscopy, right video-assisted thoracoscopy, and wedge resection of the right upper lobe by Erin [name], M.D.  Findings at surgery were a 1 cm nodule in the right upper lobe.  This was sent for frozen section. Section of the right pleura were also sent for frozen section diagnosis.  At the end of the procedure, a 28 French chest tube was placed in the incision and attached to the Pleuravac and suction.  She was  transferred from the operating room in stable condition to the post anesthesia care unit.  Final pathology diagnosis was a fibrocalcific nodule, chronic inflammation, and emphysematous changes.  Margins negative.  No evidence of malignancy on either specimen.  Erin [name] postoperative course has been uneventful.  She has made very good progress.  Erin [name], M.D., anticipates discharge for February 18, 2000.  DISCHARGE MEDICATIONS: 1. Ultram 100 mg one to two p.o. q.6h. p.r.n. for pain. 2. Resume her home medication of Zocor 20 mg q.d. 3. Resume her home medication of Aciphex 20 mg q.d. 4. Resume her home medication of Evista 60 mg q.d.  FOLLOW-UP:  She has been given an appointment to see Erin [name]. Dorris [name], M.D., on March 02, 2000, and also instructed to obtain a chest x-ray at [facility] about one hour before that appointment. DD:  02/17/00 TD:  02/18/00 Job: [id number]

## 2010-12-15 ENCOUNTER — Encounter: Payer: Self-pay | Admitting: Family Medicine

## 2010-12-15 ENCOUNTER — Ambulatory Visit (INDEPENDENT_AMBULATORY_CARE_PROVIDER_SITE_OTHER): Payer: Medicare Other | Admitting: Family Medicine

## 2010-12-15 VITALS — BP 148/72 | HR 60 | Resp 16 | Ht 65.75 in | Wt 175.1 lb

## 2010-12-15 DIAGNOSIS — M899 Disorder of bone, unspecified: Secondary | ICD-10-CM

## 2010-12-15 DIAGNOSIS — M1710 Unilateral primary osteoarthritis, unspecified knee: Secondary | ICD-10-CM

## 2010-12-15 DIAGNOSIS — Z13820 Encounter for screening for osteoporosis: Secondary | ICD-10-CM

## 2010-12-15 DIAGNOSIS — I10 Essential (primary) hypertension: Secondary | ICD-10-CM

## 2010-12-15 DIAGNOSIS — IMO0002 Reserved for concepts with insufficient information to code with codable children: Secondary | ICD-10-CM

## 2010-12-15 DIAGNOSIS — R7309 Other abnormal glucose: Secondary | ICD-10-CM

## 2010-12-15 DIAGNOSIS — R7301 Impaired fasting glucose: Secondary | ICD-10-CM

## 2010-12-15 DIAGNOSIS — K219 Gastro-esophageal reflux disease without esophagitis: Secondary | ICD-10-CM

## 2010-12-15 LAB — POCT URINALYSIS DIPSTICK
Glucose, UA: NEGATIVE
Leukocytes, UA: NEGATIVE
Protein, UA: NEGATIVE

## 2010-12-15 NOTE — Patient Instructions (Addendum)
F/u in 3 months  We will obtain recent labs so we can see what you need.  You are being referred for a bone density scan   your urine is being tested if abnormal we will treat presumptively.  You need ZOSTAvAX  For protection against shingles  You should use your cane for safety

## 2010-12-21 ENCOUNTER — Telehealth: Payer: Self-pay | Admitting: Family Medicine

## 2010-12-21 NOTE — Telephone Encounter (Signed)
dexa request  has been entered , pls sched bone density test and let her know

## 2010-12-22 ENCOUNTER — Inpatient Hospital Stay (HOSPITAL_COMMUNITY): Admission: RE | Admit: 2010-12-22 | Payer: PRIVATE HEALTH INSURANCE | Source: Ambulatory Visit

## 2010-12-22 ENCOUNTER — Ambulatory Visit (HOSPITAL_COMMUNITY)
Admission: RE | Admit: 2010-12-22 | Discharge: 2010-12-22 | Disposition: A | Discharge status: Home / Self Care | Payer: Medicare Other | Source: Ambulatory Visit | Attending: Family Medicine | Admitting: Family Medicine

## 2010-12-22 DIAGNOSIS — Z13820 Encounter for screening for osteoporosis: Secondary | ICD-10-CM

## 2010-12-22 DIAGNOSIS — Z780 Asymptomatic menopausal state: Secondary | ICD-10-CM | POA: Insufficient documentation

## 2010-12-22 DIAGNOSIS — M818 Other osteoporosis without current pathological fracture: Secondary | ICD-10-CM | POA: Insufficient documentation

## 2010-12-22 LAB — VITAMIN D 25 HYDROXY (VIT D DEFICIENCY, FRACTURES): Vit D, 25-Hydroxy: 42 ng/mL (ref 30–89)

## 2010-12-23 ENCOUNTER — Other Ambulatory Visit: Payer: Self-pay | Admitting: Orthopedic Surgery

## 2010-12-23 ENCOUNTER — Telehealth: Payer: Self-pay | Admitting: *Deleted

## 2010-12-23 DIAGNOSIS — M549 Dorsalgia, unspecified: Secondary | ICD-10-CM

## 2010-12-23 DIAGNOSIS — M4800 Spinal stenosis, site unspecified: Secondary | ICD-10-CM

## 2010-12-23 NOTE — Telephone Encounter (Signed)
Faxed over order to [facility] for ESI L4-5

## 2010-12-24 ENCOUNTER — Ambulatory Visit
Admission: RE | Admit: 2010-12-24 | Discharge: 2010-12-24 | Disposition: A | Discharge status: Home / Self Care | Payer: Medicare Other | Source: Ambulatory Visit | Attending: Orthopedic Surgery | Admitting: Orthopedic Surgery

## 2010-12-24 DIAGNOSIS — M549 Dorsalgia, unspecified: Secondary | ICD-10-CM

## 2010-12-25 ENCOUNTER — Telehealth: Payer: Self-pay | Admitting: Family Medicine

## 2010-12-25 ENCOUNTER — Ambulatory Visit (INDEPENDENT_AMBULATORY_CARE_PROVIDER_SITE_OTHER): Payer: Medicare Other | Admitting: Family Medicine

## 2010-12-25 ENCOUNTER — Other Ambulatory Visit (INDEPENDENT_AMBULATORY_CARE_PROVIDER_SITE_OTHER): Payer: Medicare Other

## 2010-12-25 VITALS — BP 134/80 | Wt 171.0 lb

## 2010-12-25 DIAGNOSIS — N3000 Acute cystitis without hematuria: Secondary | ICD-10-CM

## 2010-12-25 LAB — POCT URINALYSIS DIPSTICK
Protein, UA: NEGATIVE
Urobilinogen, UA: 0.2

## 2010-12-25 MED ORDER — CIPROFLOXACIN HCL 500 MG PO TABS: 500.0000 mg | ORAL_TABLET | Freq: Two times a day (BID) | ORAL | Status: AC

## 2010-12-25 NOTE — Progress Notes (Signed)
URINE WAS POSITIVE FOR INFECTION. WILL SEND FOR CULTURE. PATIENT HAD TO LEAVE AND TOLD HER I WOULD CALL HER WITH THE RESULTS AND SEND IN MED FOR HER TO [facility]

## 2010-12-25 NOTE — Telephone Encounter (Signed)
Coming in today for a nurse visit today. Sallie to schedule

## 2010-12-26 NOTE — Progress Notes (Signed)
  Subjective:    Patient ID: Erin [name], female    DOB: [birth year], 77 y.o.   MRN: [id number]  HPI    Review of Systems     Objective:   Physical Exam        Assessment & Plan:  Based abnormal cCUa, ciprofloxacin prescribed presumptively, spec sent for c/s

## 2010-12-28 LAB — URINE CULTURE

## 2010-12-29 ENCOUNTER — Telehealth (INDEPENDENT_AMBULATORY_CARE_PROVIDER_SITE_OTHER): Payer: Medicare Other | Admitting: *Deleted

## 2010-12-29 DIAGNOSIS — M810 Age-related osteoporosis without current pathological fracture: Secondary | ICD-10-CM

## 2010-12-29 MED ORDER — ALENDRONATE SODIUM 70 MG PO TABS: 70.0000 mg | ORAL_TABLET | ORAL | Status: DC

## 2010-12-29 NOTE — Telephone Encounter (Signed)
Patient was advised of bone density results and med sent in

## 2011-01-03 NOTE — Assessment & Plan Note (Signed)
Pt has instability , encouraged to continue to ambulate with a cane

## 2011-01-03 NOTE — Progress Notes (Signed)
  Subjective:    Patient ID: Erin [name], female    DOB: January 02, 1930, 77 y.o.   MRN: [id number]  HPI Pt is here to establish care. She is concerned about bone density and wants this checked. States her labs were done fairly recently so I will review these before ordering any. Staes she was referred  here by her orthopedic doc. She has chronic knee and back pain with unsteady gait and ambulates with a cane   Review of Systems Denies recent fever or chills. Denies sinus pressure, nasal congestion, ear pain or sore throat. Denies chest congestion, productive cough or wheezing. Denies chest pains, palpitations, paroxysmal nocturnal dyspnea, orthopnea and leg swelling Denies uncontrolled  abdominal pain, nausea, vomiting,diarrhea or constipation.  Denies rectal bleeding or change in bowel movement.Takes med for reflux which controls her symptoms. Denies dysuria,notes increased  Frequency,in the last week,    Denies headaches, seizure, numbness, or tingling. Denies depression, anxiety or insomnia. Denies skin break down or rash.        Objective:   Physical Exam Patient alert and oriented and in no Cardiopulmonary distress.  HEENT: No facial asymmetry, EOMI, no sinus tenderness, TM's clear, Oropharynx pink and moist.  Neck  no adenopathy.  Chest: Clear to auscultation bilaterally.  CVS: S1, S2 no murmurs, no S3.  ABD: Soft non tender. Bowel sounds normal.  Ext: No edema  IH:[id number] ROM spine, shoulders, hips and knees.Ambulates with a cane  Skin: Intact, no ulcerations or rash noted.  Psych: Good eye contact, normal affect. Memory intact not anxious or depressed appearing.  CNS: CN 2-12 intact, power, tone and sensation normal throughout.        Assessment & Plan:

## 2011-01-03 NOTE — Assessment & Plan Note (Signed)
Controlled, no change in medication  

## 2011-01-03 NOTE — Assessment & Plan Note (Signed)
Fair control , though sub optimal, no change in medication, also followed by cardiology

## 2011-01-10 HISTORY — PX: ENDOVENOUS ABLATION SAPHENOUS VEIN W/ LASER: SUR449

## 2011-01-15 ENCOUNTER — Telehealth: Payer: Self-pay | Admitting: *Deleted

## 2011-01-15 MED ORDER — AMLODIPINE BESYLATE 5 MG PO TABS: 5.0000 mg | ORAL_TABLET | Freq: Every day | ORAL | Status: DC

## 2011-01-15 MED ORDER — LOSARTAN POTASSIUM-HCTZ 50-12.5 MG PO TABS: 1.0000 | ORAL_TABLET | Freq: Every day | ORAL | Status: DC

## 2011-01-15 NOTE — Telephone Encounter (Signed)
Change to amlodipine 5 mg one daily and losartan/hctz 50/12.5mg  one daily pls d/c the tribenzor and send in 30 day with 1 refil of each, she needs an MD visit in the next 6 to 8 weeks

## 2011-01-15 NOTE — Telephone Encounter (Signed)
New meds sent, patient aware

## 2011-01-18 ENCOUNTER — Other Ambulatory Visit: Payer: Self-pay

## 2011-01-18 MED ORDER — TRAMADOL HCL 50 MG PO TABS: ORAL_TABLET | ORAL | Status: DC

## 2011-01-25 ENCOUNTER — Telehealth: Payer: Self-pay | Admitting: Family Medicine

## 2011-01-25 NOTE — Telephone Encounter (Signed)
Called to let her know that we have the shingles shot here, No answer

## 2011-01-27 ENCOUNTER — Ambulatory Visit (INDEPENDENT_AMBULATORY_CARE_PROVIDER_SITE_OTHER): Payer: Medicare Other | Admitting: Family Medicine

## 2011-01-27 DIAGNOSIS — Z23 Encounter for immunization: Secondary | ICD-10-CM

## 2011-01-27 DIAGNOSIS — Z2911 Encounter for prophylactic immunotherapy for respiratory syncytial virus (RSV): Secondary | ICD-10-CM

## 2011-01-27 NOTE — Telephone Encounter (Signed)
Coming in today

## 2011-02-17 ENCOUNTER — Other Ambulatory Visit: Payer: Self-pay | Admitting: Family Medicine

## 2011-03-16 ENCOUNTER — Ambulatory Visit (INDEPENDENT_AMBULATORY_CARE_PROVIDER_SITE_OTHER): Payer: Medicare Other | Admitting: Family Medicine

## 2011-03-16 ENCOUNTER — Encounter: Payer: Self-pay | Admitting: Family Medicine

## 2011-03-16 VITALS — BP 124/78 | HR 72 | Resp 16 | Ht 65.75 in | Wt 168.4 lb

## 2011-03-16 DIAGNOSIS — R7309 Other abnormal glucose: Secondary | ICD-10-CM

## 2011-03-16 DIAGNOSIS — R7303 Prediabetes: Secondary | ICD-10-CM | POA: Insufficient documentation

## 2011-03-16 DIAGNOSIS — E785 Hyperlipidemia, unspecified: Secondary | ICD-10-CM | POA: Insufficient documentation

## 2011-03-16 DIAGNOSIS — N390 Urinary tract infection, site not specified: Secondary | ICD-10-CM

## 2011-03-16 DIAGNOSIS — K219 Gastro-esophageal reflux disease without esophagitis: Secondary | ICD-10-CM

## 2011-03-16 DIAGNOSIS — I10 Essential (primary) hypertension: Secondary | ICD-10-CM

## 2011-03-16 DIAGNOSIS — M1990 Unspecified osteoarthritis, unspecified site: Secondary | ICD-10-CM

## 2011-03-16 LAB — POCT URINALYSIS DIPSTICK
Blood, UA: NEGATIVE
Glucose, UA: NEGATIVE
Protein, UA: NEGATIVE
Spec Grav, UA: 1.015
Urobilinogen, UA: NEGATIVE
pH, UA: 7

## 2011-03-16 MED ORDER — CIPROFLOXACIN HCL 500 MG PO TABS: 500.0000 mg | ORAL_TABLET | Freq: Two times a day (BID) | ORAL | Status: AC

## 2011-03-16 NOTE — Progress Notes (Signed)
  Subjective:    Patient ID: Erin [name], female    DOB: 02-Jun-1930, 77 y.o.   MRN: [id number]  HPI The PT is here for follow up and re-evaluation of chronic medical conditions, medication management and review of any available recent lab and radiology data.  Preventive health is updated, specifically  Cancer screening and Immunization.   Questions or concerns regarding consultations or procedures which the PT has had in the interim are  addressed. The PT denies any adverse reactions to current medications since the last visit.  C/o urinary frequency and malodorous urine x 1 week      Review of Systems See HPI Denies recent fever or chills. Denies sinus pressure, nasal congestion, ear pain or sore throat. Denies chest congestion, productive cough or wheezing. Denies chest pains, palpitations and leg swelling Denies abdominal pain, nausea, vomiting,diarrhea or constipation.    Denies joint pain, swelling and limitation in mobility. Denies headaches, seizures, numbness, or tingling. Denies depression, anxiety or insomnia. Denies skin break down or rash.        Objective:   Physical Exam  Patient alert and oriented and in no cardiopulmonary distress.  HEENT: No facial asymmetry, EOMI, no sinus tenderness,  oropharynx pink and moist.  Neck supple no adenopathy.  Chest: Clear to auscultation bilaterally.  CVS: S1, S2 no murmurs, no S3.  ABD: Soft non tender. Bowel sounds normal.  Ext: No edema  MS: Decreased  ROM spine, shoulders, hips and knees.  Skin: Intact, no ulcerations or rash noted.  Psych: Good eye contact, normal affect. Memory intact not anxious or depressed appearing.  CNS: CN 2-12 intact, power, tone and sensation normal throughout.       Assessment & Plan:

## 2011-03-16 NOTE — Assessment & Plan Note (Signed)
Pt to reduce carbs and sweets and lose weight to prevent onset of diaetes

## 2011-03-16 NOTE — Assessment & Plan Note (Signed)
Controlled, no change in medication  

## 2011-03-16 NOTE — Assessment & Plan Note (Signed)
Low fat diet encouraged, continue meds and f/u on labs

## 2011-03-16 NOTE — Patient Instructions (Addendum)
F/u in  4 months.  HBA1C today and chem 7 today HBA1C in 4 months, Fasting lipid, chem7 , hepatic  if neededed, will check with cardiology   It is important that you exercise regularly at least 10 to15 minutes 5 times a week. If you develop chest pain, have severe difficulty breathing, or feel very tired, stop exercising immediately and seek medical attention   I will send for recent  Notes from your specialists, and also your colonscopy.  Pls attend a session to learn how to eat to help prevent diabetes  Keep smiling and sharing your inner light.( I know I don't need to ask you to do that, you have no intention of changing that!)  Please take blood pressure medication every day at the same time

## 2011-03-18 ENCOUNTER — Telehealth: Payer: Self-pay | Admitting: Family Medicine

## 2011-03-18 LAB — URINE CULTURE: Colony Count: 15000

## 2011-03-19 ENCOUNTER — Other Ambulatory Visit: Payer: Self-pay | Admitting: Family Medicine

## 2011-03-19 NOTE — Telephone Encounter (Signed)
Please let her know, her urine was clear, there was no infection, She can stop the Cipro. Drink plenty of fluids, try cranberry juice

## 2011-03-19 NOTE — Telephone Encounter (Signed)
PATIENT AWARE

## 2011-03-22 ENCOUNTER — Ambulatory Visit: Payer: PRIVATE HEALTH INSURANCE | Admitting: Family Medicine

## 2011-04-06 ENCOUNTER — Other Ambulatory Visit: Payer: Self-pay | Admitting: Family Medicine

## 2011-05-06 ENCOUNTER — Encounter: Payer: Self-pay | Admitting: Thoracic Surgery (Cardiothoracic Vascular Surgery)

## 2011-05-06 DIAGNOSIS — K219 Gastro-esophageal reflux disease without esophagitis: Secondary | ICD-10-CM

## 2011-05-06 DIAGNOSIS — M810 Age-related osteoporosis without current pathological fracture: Secondary | ICD-10-CM | POA: Insufficient documentation

## 2011-05-13 ENCOUNTER — Encounter: Payer: Self-pay | Admitting: Thoracic Surgery (Cardiothoracic Vascular Surgery)

## 2011-05-17 ENCOUNTER — Other Ambulatory Visit: Payer: Self-pay | Admitting: Thoracic Surgery (Cardiothoracic Vascular Surgery)

## 2011-05-17 DIAGNOSIS — D381 Neoplasm of uncertain behavior of trachea, bronchus and lung: Secondary | ICD-10-CM

## 2011-05-20 ENCOUNTER — Encounter: Payer: Medicare Other | Admitting: Thoracic Surgery (Cardiothoracic Vascular Surgery)

## 2011-05-31 ENCOUNTER — Encounter: Payer: Self-pay | Admitting: Family Medicine

## 2011-06-07 ENCOUNTER — Other Ambulatory Visit: Payer: Self-pay | Admitting: Thoracic Surgery (Cardiothoracic Vascular Surgery)

## 2011-06-07 DIAGNOSIS — D381 Neoplasm of uncertain behavior of trachea, bronchus and lung: Secondary | ICD-10-CM

## 2011-06-09 ENCOUNTER — Encounter: Payer: Self-pay | Admitting: Thoracic Surgery (Cardiothoracic Vascular Surgery)

## 2011-06-09 ENCOUNTER — Ambulatory Visit (INDEPENDENT_AMBULATORY_CARE_PROVIDER_SITE_OTHER): Payer: Medicare Other | Admitting: Thoracic Surgery (Cardiothoracic Vascular Surgery)

## 2011-06-09 ENCOUNTER — Ambulatory Visit
Admission: RE | Admit: 2011-06-09 | Discharge: 2011-06-09 | Disposition: A | Discharge status: Home / Self Care | Payer: Medicare Other | Source: Ambulatory Visit | Attending: Thoracic Surgery (Cardiothoracic Vascular Surgery) | Admitting: Thoracic Surgery (Cardiothoracic Vascular Surgery)

## 2011-06-09 VITALS — BP 126/71 | HR 75 | Resp 16

## 2011-06-09 DIAGNOSIS — D381 Neoplasm of uncertain behavior of trachea, bronchus and lung: Secondary | ICD-10-CM

## 2011-06-09 DIAGNOSIS — J8410 Pulmonary fibrosis, unspecified: Secondary | ICD-10-CM

## 2011-06-09 NOTE — Progress Notes (Signed)
HPI: Erin [name] returns for one year followup visit. She had a mass in 2001 for a lung nodule which turned out to be granulomatous disease. She had multiple other lung nodules noted on scans after that has been followed annual basis. For the past few years we've been doing chest x-rays a once a year and has been stable. She states that since her last visit she's been doing well she has been changed doctors and her primary physician is now Erin [name]. She also has seen Erin [name] recently told her heart rate was slow, but otherwise she was doing well.  She denies any chest pain, shortness of breath, wheezing, hemoptysis, cough.  Current Outpatient Prescriptions  Medication Sig Dispense Refill  . aspirin 81 MG tablet Take 81 mg by mouth daily.        . Calcium Carbonate-Vit D-Min 1200-1000 MG-UNIT CHEW Chew 1 capsule by mouth 2 (two) times daily.        [name] esomeprazole (NEXIUM) 40 MG capsule Take 40 mg by mouth daily before breakfast.        . ezetimibe-simvastatin (VYTORIN) 10-40 MG per tablet Take 1 tablet by mouth at bedtime.        [name] losartan-hydrochlorothiazide (HYZAAR) 50-12.5 MG per tablet TAKE 1 TABLET BY MOUTH DAILY.  30 tablet  3  . Mag Aspart-Potassium Aspart 90-90 MG CAPS Take 1 capsule by mouth at bedtime.        . metoprolol (TOPROL XL) 50 MG 24 hr tablet Take 50 mg by mouth daily.        . niacin 500 MG CR capsule Take 500 mg by mouth at bedtime.        . Olmesartan-Amlodipine-HCTZ 20-5-12.5 MG TABS Take by mouth.        . topiramate (TOPAMAX) 50 MG tablet Take 50 mg by mouth 2 (two) times daily.        . vitamin B-12 (CYANOCOBALAMIN) 1000 MCG tablet Take 1,000 mcg by mouth daily.        . vitamin E 400 UNIT capsule Take 400 Units by mouth daily.           Physical Exam: BP 126/71  Pulse 75  Resp 16  SpO2 98% Gen. 77 year old woman with kyphoscoliosis, no acute distress Lungs clear with equal breath sounds bilaterally No cervical or subclavicular  adenopathy Cardiac exam regularly irregular, no murmur  Diagnostic Tests: Chest x-ray essentially unchanged over the past year, no nodules noted  Impression: 77 year old woman with a history of granulomatous disease, and multiple other lung nodules that have been followed. For a while we followed these for CT but they remained relatively stable and more recently just been following her with plain chest x-rays to make sure that no nodules become large enough to be noticed that with that modality. There no nodules noted on her x-ray today.  Plan: I will plan to see Erin [name] back in one year with another chest x-ray at that time.

## 2011-06-10 ENCOUNTER — Ambulatory Visit (INDEPENDENT_AMBULATORY_CARE_PROVIDER_SITE_OTHER): Payer: Medicare Other | Admitting: Family Medicine

## 2011-06-10 ENCOUNTER — Encounter: Payer: Self-pay | Admitting: Family Medicine

## 2011-06-10 VITALS — BP 120/74 | HR 78 | Resp 16 | Ht 65.75 in | Wt 167.0 lb

## 2011-06-10 DIAGNOSIS — Z23 Encounter for immunization: Secondary | ICD-10-CM

## 2011-06-10 DIAGNOSIS — R7303 Prediabetes: Secondary | ICD-10-CM

## 2011-06-10 DIAGNOSIS — R7309 Other abnormal glucose: Secondary | ICD-10-CM

## 2011-06-10 DIAGNOSIS — K219 Gastro-esophageal reflux disease without esophagitis: Secondary | ICD-10-CM

## 2011-06-10 DIAGNOSIS — I10 Essential (primary) hypertension: Secondary | ICD-10-CM

## 2011-06-10 DIAGNOSIS — E785 Hyperlipidemia, unspecified: Secondary | ICD-10-CM

## 2011-06-10 NOTE — Progress Notes (Signed)
  Subjective:    Patient ID: Erin [name], female    DOB: 1930-05-25, 77 y.o.   MRN: [id number]  HPI The PT is here for follow up and re-evaluation of chronic medical conditions, medication management and review of any available recent lab and radiology data.  Preventive health is updated, specifically  Cancer screening and Immunization.   Questions or concerns regarding consultations or procedures which the PT has had in the interim are  addressed. The PT denies any adverse reactions to current medications since the last visit.  Pt has suffered from essential tremor for years, though it makes feeding herself difficult at times, she is able to do this. States she was told by one neurologist that hse had Parkinson's however another denied this, and she does not believe that she has Parkinson's On exam she has no cogwheeling    Review of Systems See HPI Denies recent fever or chills. Denies sinus pressure, nasal congestion, ear pain or sore throat. Denies chest congestion, productive cough or wheezing. Denies chest pains, palpitations and leg swelling Denies abdominal pain, nausea, vomiting,diarrhea or constipation.   Denies dysuria, frequency, hesitancy or incontinence. Chronic back pain from arthritis, not significantly limiting her mobility. Denies headaches, seizures, numbness, or tingling. Denies depression, anxiety or insomnia. Denies skin break down or rash.        Objective:   Physical Exam Patient alert and oriented and in no cardiopulmonary distress.  HEENT: No facial asymmetry, EOMI, no sinus tenderness,  oropharynx pink and moist.  Neck supple no adenopathy.  Chest: Clear to auscultation bilaterally.  CVS: S1, S2 no murmurs, no S3.  ABD: Soft non tender. Bowel sounds normal.  Ext: No edema  MS: Adequate though reduced  ROM spine, shoulders, hips and knees.  Skin: Intact, no ulcerations or rash noted.  Psych: Good eye contact, normal affect. Memory intact not  anxious or depressed appearing.  CNS: CN 2-12 intact, power, tone and sensation normal throughout.Tremor of hands at rest increased with intentional movement        Assessment & Plan:

## 2011-06-10 NOTE — Patient Instructions (Signed)
F/u in 4.5 months.  congrats on weight loss.   hBa1C today   hBA1c AND CHEM 7 IN 4 MONTHS BEFORE NEXT VISIT.  fLU vaccine  Your blood pressure is great , no med change

## 2011-06-11 LAB — HEMOGLOBIN A1C
Hgb A1c MFr Bld: 6.1 % — ABNORMAL HIGH (ref <5.7)
Mean Plasma Glucose: 128 mg/dL — ABNORMAL HIGH (ref <117)

## 2011-06-12 NOTE — Assessment & Plan Note (Signed)
Controlled, no change in medication  

## 2011-06-12 NOTE — Assessment & Plan Note (Signed)
Low carb diet discussed and encouraged. Improved hBa1c

## 2011-06-12 NOTE — Assessment & Plan Note (Signed)
Low fat diet discussed and encouraged. Needs updated lipid profile

## 2011-08-05 ENCOUNTER — Ambulatory Visit (INDEPENDENT_AMBULATORY_CARE_PROVIDER_SITE_OTHER): Payer: Medicare Other | Admitting: Otolaryngology

## 2011-08-05 DIAGNOSIS — H903 Sensorineural hearing loss, bilateral: Secondary | ICD-10-CM

## 2011-08-05 DIAGNOSIS — R42 Dizziness and giddiness: Secondary | ICD-10-CM

## 2011-08-09 ENCOUNTER — Telehealth: Payer: Self-pay | Admitting: Family Medicine

## 2011-08-09 NOTE — Telephone Encounter (Signed)
Unable to reach pt. Phone was off the hook.

## 2011-08-10 NOTE — Telephone Encounter (Signed)
Will discuss calcitonin option at next visit

## 2011-08-10 NOTE — Telephone Encounter (Signed)
noted 

## 2011-08-10 NOTE — Telephone Encounter (Signed)
Spoke with pt and she wanted to know results. Pt notified and she stated that she is not taking fosamax because it made her sick on the stomach.  She is taking otc vit d with calcium 3 times daily.  fyi

## 2011-08-18 ENCOUNTER — Other Ambulatory Visit: Payer: Self-pay | Admitting: Family Medicine

## 2011-09-13 ENCOUNTER — Other Ambulatory Visit: Payer: Self-pay | Admitting: Family Medicine

## 2011-09-13 ENCOUNTER — Telehealth: Payer: Self-pay | Admitting: Family Medicine

## 2011-09-13 MED ORDER — ESOMEPRAZOLE MAGNESIUM 40 MG PO CPDR: 40.0000 mg | DELAYED_RELEASE_CAPSULE | Freq: Every day | ORAL | Status: DC

## 2011-09-13 NOTE — Telephone Encounter (Signed)
Sent in as requested 

## 2011-09-17 ENCOUNTER — Other Ambulatory Visit: Payer: Self-pay | Admitting: Family Medicine

## 2011-09-20 ENCOUNTER — Telehealth: Payer: Self-pay | Admitting: Family Medicine

## 2011-09-20 NOTE — Telephone Encounter (Signed)
Noted  

## 2011-09-24 ENCOUNTER — Other Ambulatory Visit: Payer: Self-pay

## 2011-09-24 ENCOUNTER — Ambulatory Visit (INDEPENDENT_AMBULATORY_CARE_PROVIDER_SITE_OTHER): Payer: Medicare Other | Admitting: Urology

## 2011-09-24 DIAGNOSIS — N281 Cyst of kidney, acquired: Secondary | ICD-10-CM

## 2011-09-24 DIAGNOSIS — R109 Unspecified abdominal pain: Secondary | ICD-10-CM

## 2011-09-24 DIAGNOSIS — N3941 Urge incontinence: Secondary | ICD-10-CM

## 2011-09-24 MED ORDER — AMLODIPINE BESYLATE 5 MG PO TABS: ORAL_TABLET | ORAL | Status: DC

## 2011-09-30 ENCOUNTER — Telehealth: Payer: Self-pay | Admitting: Family Medicine

## 2011-10-01 NOTE — Telephone Encounter (Signed)
Patient is aware 

## 2011-10-14 ENCOUNTER — Encounter: Payer: Self-pay | Admitting: Family Medicine

## 2011-10-14 ENCOUNTER — Ambulatory Visit (INDEPENDENT_AMBULATORY_CARE_PROVIDER_SITE_OTHER): Payer: Medicare Other | Admitting: Family Medicine

## 2011-10-14 VITALS — BP 128/66 | HR 72 | Resp 18 | Ht 65.75 in | Wt 175.0 lb

## 2011-10-14 DIAGNOSIS — R269 Unspecified abnormalities of gait and mobility: Secondary | ICD-10-CM

## 2011-10-14 DIAGNOSIS — R2681 Unsteadiness on feet: Secondary | ICD-10-CM

## 2011-10-14 DIAGNOSIS — M8580 Other specified disorders of bone density and structure, unspecified site: Secondary | ICD-10-CM | POA: Insufficient documentation

## 2011-10-14 DIAGNOSIS — M810 Age-related osteoporosis without current pathological fracture: Secondary | ICD-10-CM

## 2011-10-14 DIAGNOSIS — I10 Essential (primary) hypertension: Secondary | ICD-10-CM

## 2011-10-14 DIAGNOSIS — M949 Disorder of cartilage, unspecified: Secondary | ICD-10-CM

## 2011-10-14 DIAGNOSIS — M25579 Pain in unspecified ankle and joints of unspecified foot: Secondary | ICD-10-CM

## 2011-10-14 DIAGNOSIS — M25571 Pain in right ankle and joints of right foot: Secondary | ICD-10-CM

## 2011-10-14 DIAGNOSIS — M7989 Other specified soft tissue disorders: Secondary | ICD-10-CM

## 2011-10-14 DIAGNOSIS — G20A1 Parkinson's disease without dyskinesia, without mention of fluctuations: Secondary | ICD-10-CM

## 2011-10-14 DIAGNOSIS — G20 Parkinson's disease: Secondary | ICD-10-CM

## 2011-10-14 MED ORDER — RISEDRONATE SODIUM 35 MG PO TABS: 35.0000 mg | ORAL_TABLET | Freq: Every day | ORAL | Status: DC

## 2011-10-14 NOTE — Patient Instructions (Signed)
F/u as before.  You are referred to dr Anne Hahn and Dr Romeo Apple.  You are also referred for an ultrasound of the swollen right leg.  Pls try actonel for your bones.  You will be referred to [facility]

## 2011-10-14 NOTE — Assessment & Plan Note (Signed)
Worsening symptoms, reports 4 falls since October, most recent in February

## 2011-10-14 NOTE — Progress Notes (Signed)
  Subjective:    Patient ID: Erin [name], female    DOB: [birth year], 78 y.o.   MRN: [id number]  HPI Pt in with a 1 month h/o right leg swelling. States that since October she has fallen 3 times, reports significant imbalance , next appt with neurology is in the Fall. Was told years ago by 1 neurologist that she had Parkinson's , however another told her this was not the case. She has been regularly seeing chiropracter , who advised she urgently see her medical doc about these 2 issues. Denies increased shortness of breath or hemoptysis. Though she has osteoperosis , has not been taking the fosamax, due to intolerance, willing to try another agent, esp in light of recurrent falls. C/o increased right ankle pain with reduced mobility in the joint   Review of Systems See HPI Denies recent fever or chills. Denies sinus pressure, nasal congestion, ear pain or sore throat. Denies chest congestion, productive cough or wheezing. Denies chest pains, palpitations PND or orthopnea Denies abdominal pain, nausea, vomiting,diarrhea or constipation.   Denies dysuria, frequency, hesitancy or incontinence. Denies headaches, seizures, numbness, or tingling. Denies depression, anxiety or insomnia. Denies skin break down or rash.        Objective:   Physical Exam Patient alert and oriented and in no cardiopulmonary distress.  HEENT: No facial asymmetry, EOMI, no sinus tenderness,  oropharynx pink and moist.  Neck supple no adenopathy.  Chest: Clear to auscultation bilaterally.  CVS: S1, S2 no murmurs, no S3.  ABD: Soft non tender. Bowel sounds normal.  Ext: right leg swelling  MS: decreased  ROM spine, shoulders, hips and knees.  Skin: Intact, no ulcerations or rash noted.  Psych: Good eye contact, normal affect. Memory intact not anxious or depressed appearing.  CNS: CN 2-12 intact, power, tone and sensation normal throughout.        Assessment & Plan:

## 2011-10-15 ENCOUNTER — Ambulatory Visit (HOSPITAL_COMMUNITY)
Admission: RE | Admit: 2011-10-15 | Discharge: 2011-10-15 | Disposition: A | Discharge status: Home / Self Care | Payer: Medicare Other | Source: Ambulatory Visit | Attending: Family Medicine | Admitting: Family Medicine

## 2011-10-15 DIAGNOSIS — M7989 Other specified soft tissue disorders: Secondary | ICD-10-CM

## 2011-10-15 DIAGNOSIS — M79609 Pain in unspecified limb: Secondary | ICD-10-CM | POA: Insufficient documentation

## 2011-10-15 DIAGNOSIS — E7800 Pure hypercholesterolemia, unspecified: Secondary | ICD-10-CM | POA: Insufficient documentation

## 2011-10-15 DIAGNOSIS — I10 Essential (primary) hypertension: Secondary | ICD-10-CM | POA: Insufficient documentation

## 2011-10-17 NOTE — Assessment & Plan Note (Signed)
High fracture risk espescialy in light of recurrent falls, pt to start actonel, reports intolerance to fosamax

## 2011-10-17 NOTE — Assessment & Plan Note (Signed)
Venous doppler to r/o DVT

## 2011-10-17 NOTE — Assessment & Plan Note (Signed)
Controlled, no change in medication  

## 2011-10-17 NOTE — Assessment & Plan Note (Signed)
Increased pain with reduced mobility, ortho eval

## 2011-10-17 NOTE — Assessment & Plan Note (Signed)
Recent h/o increased gait instability with repeated falls,  In the past 5 months, neurology to re evaluate also referral to [facility]

## 2011-10-18 ENCOUNTER — Telehealth: Payer: Self-pay | Admitting: Family Medicine

## 2011-10-18 NOTE — Telephone Encounter (Signed)
Referrals were entered since 4/4 pls f/u on this the notes have also been completed

## 2011-10-20 ENCOUNTER — Other Ambulatory Visit: Payer: Self-pay | Admitting: Family Medicine

## 2011-10-20 LAB — COMPREHENSIVE METABOLIC PANEL
ALT: 10 U/L (ref 0–35)
Alkaline Phosphatase: 74 U/L (ref 39–117)
CO2: 27 mEq/L (ref 19–32)
Creat: 0.9 mg/dL (ref 0.50–1.10)
Glucose, Bld: 99 mg/dL (ref 70–99)
Total Bilirubin: 0.6 mg/dL (ref 0.3–1.2)

## 2011-10-21 LAB — HEMOGLOBIN A1C: Mean Plasma Glucose: 128 mg/dL — ABNORMAL HIGH (ref <117)

## 2011-10-22 ENCOUNTER — Other Ambulatory Visit: Payer: Self-pay | Admitting: Family Medicine

## 2011-10-22 LAB — LIPID PANEL
Cholesterol: 185 mg/dL (ref 0–200)
LDL Cholesterol: 108 mg/dL — ABNORMAL HIGH (ref 0–99)
Total CHOL/HDL Ratio: 3.3 Ratio
Triglycerides: 104 mg/dL (ref <150)
VLDL: 21 mg/dL (ref 0–40)

## 2011-10-25 ENCOUNTER — Encounter: Payer: Self-pay | Admitting: Orthopedic Surgery

## 2011-10-25 ENCOUNTER — Ambulatory Visit (INDEPENDENT_AMBULATORY_CARE_PROVIDER_SITE_OTHER): Payer: Medicare Other | Admitting: Orthopedic Surgery

## 2011-10-25 VITALS — BP 104/58 | Ht 65.75 in | Wt 175.0 lb

## 2011-10-25 DIAGNOSIS — M19079 Primary osteoarthritis, unspecified ankle and foot: Secondary | ICD-10-CM

## 2011-10-25 DIAGNOSIS — M19071 Primary osteoarthritis, right ankle and foot: Secondary | ICD-10-CM
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
## 2011-10-25 NOTE — Patient Instructions (Signed)
Apply aspercreme to foot 3 x a day  Go to [employer] to get foot inserts

## 2011-10-27 ENCOUNTER — Encounter: Payer: Self-pay | Admitting: Family Medicine

## 2011-10-27 ENCOUNTER — Ambulatory Visit (INDEPENDENT_AMBULATORY_CARE_PROVIDER_SITE_OTHER): Payer: Medicare Other | Admitting: Family Medicine

## 2011-10-27 VITALS — BP 118/66 | HR 74 | Resp 18 | Ht 65.75 in | Wt 173.1 lb

## 2011-10-27 DIAGNOSIS — G20 Parkinson's disease: Secondary | ICD-10-CM

## 2011-10-27 DIAGNOSIS — G20A1 Parkinson's disease without dyskinesia, without mention of fluctuations: Secondary | ICD-10-CM

## 2011-10-27 DIAGNOSIS — I10 Essential (primary) hypertension: Secondary | ICD-10-CM

## 2011-10-27 DIAGNOSIS — E785 Hyperlipidemia, unspecified: Secondary | ICD-10-CM

## 2011-10-27 DIAGNOSIS — K219 Gastro-esophageal reflux disease without esophagitis: Secondary | ICD-10-CM

## 2011-10-27 DIAGNOSIS — M7989 Other specified soft tissue disorders: Secondary | ICD-10-CM

## 2011-10-27 DIAGNOSIS — R7303 Prediabetes: Secondary | ICD-10-CM

## 2011-10-27 DIAGNOSIS — R7309 Other abnormal glucose: Secondary | ICD-10-CM

## 2011-10-27 NOTE — Progress Notes (Signed)
  Subjective:    Patient ID: Erin [name], female    DOB: Nov 27, 1929, 78 y.o.   MRN: [id number]  HPI The PT is here for follow up and re-evaluation of chronic medical conditions, medication management and review of any available recent lab and radiology data.  Preventive health is updated, specifically  Cancer screening and Immunization.   Questions or concerns regarding consultations or procedures which the PT has had in the interim are  addressed. The PT denies any adverse reactions to current medications since the last visit.  There are no new concerns.  There are no specific complaints       Review of Systems See HPI Denies recent fever or chills. Denies sinus pressure, nasal congestion, ear pain or sore throat. Denies chest congestion, productive cough or wheezing. Denies chest pains, palpitations PND and orhtopnea Denies abdominal pain, nausea, vomiting,diarrhea or constipation.   Denies dysuria, frequency, hesitancy or incontinence. Chronic  joint pain, swelling and limitation in mobility. Denies headaches, seizures, numbness, or tingling. Denies depression, anxiety or insomnia. Denies skin break down or rash.        Objective:   Physical Exam  Patient alert and oriented and in no cardiopulmonary distress.  HEENT: No facial asymmetry, EOMI, no sinus tenderness,  oropharynx pink and moist.  Neck adequate though reduced ROM, no adenopathy.  Chest: Clear to auscultation bilaterally.  CVS: S1, S2 no murmurs, no S3.  ABD: Soft non tender. Bowel sounds normal.  Ext: No edema  MS: decreased  ROM spine, shoulders, hips and knees.  Skin: Intact, no ulcerations or rash noted.  Psych: Good eye contact, normal affect. Memory intact not anxious or depressed appearing.  CNS: CN 2-12 intact, power, tone and sensation normal throughout.       Assessment & Plan:

## 2011-10-27 NOTE — Patient Instructions (Addendum)
F/U in 5 month.  Call if you need me before  Please cut back on fried and fatty foods, also  On sugary foods  I am thankful that  You feel better, and please keep appointment with the neurologist  hBA1C and chem 7, fasting lipid and hepatic in 6 month

## 2011-10-31 DIAGNOSIS — E785 Hyperlipidemia, unspecified: Secondary | ICD-10-CM | POA: Insufficient documentation

## 2011-10-31 NOTE — Assessment & Plan Note (Signed)
Unchanged blood sugars, lifestyle change encouraged as far as diet is concerned to delay or prevent diabetes onset

## 2011-10-31 NOTE — Assessment & Plan Note (Signed)
Controlled, no change in medication Followed by GI also 

## 2011-10-31 NOTE — Assessment & Plan Note (Signed)
Controlled, no change in medication  

## 2011-10-31 NOTE — Assessment & Plan Note (Signed)
Recently has had increased instability and a high fall risk, being followed by neurology

## 2011-10-31 NOTE — Assessment & Plan Note (Signed)
Not clinically significant , related to osteoarthritis

## 2011-10-31 NOTE — Assessment & Plan Note (Signed)
Hyperlipidemia:Low fat diet discussed and encouraged.  Dietary modification discussed at visit with the hope of reducing LDl to less than 100

## 2011-11-08 DIAGNOSIS — R6889 Other general symptoms and signs: Secondary | ICD-10-CM | POA: Insufficient documentation

## 2011-11-08 DIAGNOSIS — G252 Other specified forms of tremor: Secondary | ICD-10-CM | POA: Insufficient documentation

## 2011-11-08 DIAGNOSIS — R413 Other amnesia: Secondary | ICD-10-CM | POA: Insufficient documentation

## 2011-11-08 DIAGNOSIS — D518 Other vitamin B12 deficiency anemias: Secondary | ICD-10-CM | POA: Insufficient documentation

## 2011-11-11 ENCOUNTER — Other Ambulatory Visit: Payer: Self-pay | Admitting: Neurology

## 2011-11-11 DIAGNOSIS — R413 Other amnesia: Secondary | ICD-10-CM

## 2011-11-11 DIAGNOSIS — R269 Unspecified abnormalities of gait and mobility: Secondary | ICD-10-CM

## 2011-11-11 DIAGNOSIS — G252 Other specified forms of tremor: Secondary | ICD-10-CM

## 2011-11-11 DIAGNOSIS — G250 Essential tremor: Secondary | ICD-10-CM

## 2011-11-12 ENCOUNTER — Telehealth: Payer: Self-pay | Admitting: Family Medicine

## 2011-11-12 NOTE — Telephone Encounter (Signed)
Noted  

## 2011-11-18 ENCOUNTER — Ambulatory Visit
Admission: RE | Admit: 2011-11-18 | Discharge: 2011-11-18 | Disposition: A | Discharge status: Home / Self Care | Payer: Medicare Other | Source: Ambulatory Visit | Attending: Neurology | Admitting: Neurology

## 2011-11-18 DIAGNOSIS — G250 Essential tremor: Secondary | ICD-10-CM

## 2011-11-18 DIAGNOSIS — R413 Other amnesia: Secondary | ICD-10-CM

## 2011-11-18 DIAGNOSIS — R269 Unspecified abnormalities of gait and mobility: Secondary | ICD-10-CM

## 2011-11-19 ENCOUNTER — Ambulatory Visit (HOSPITAL_COMMUNITY)
Admission: RE | Admit: 2011-11-19 | Discharge: 2011-11-19 | Disposition: A | Discharge status: Home / Self Care | Payer: Medicare Other | Source: Ambulatory Visit | Attending: Neurology | Admitting: Neurology

## 2011-11-19 DIAGNOSIS — E785 Hyperlipidemia, unspecified: Secondary | ICD-10-CM | POA: Insufficient documentation

## 2011-11-19 DIAGNOSIS — I10 Essential (primary) hypertension: Secondary | ICD-10-CM | POA: Insufficient documentation

## 2011-11-19 DIAGNOSIS — M545 Low back pain, unspecified: Secondary | ICD-10-CM | POA: Insufficient documentation

## 2011-11-19 DIAGNOSIS — IMO0001 Reserved for inherently not codable concepts without codable children: Secondary | ICD-10-CM | POA: Insufficient documentation

## 2011-11-19 DIAGNOSIS — R269 Unspecified abnormalities of gait and mobility: Secondary | ICD-10-CM | POA: Insufficient documentation

## 2011-11-19 NOTE — Evaluation (Signed)
Physical Therapy Evaluation  Patient Details  Name: Erin [name] MRN: [id number] Date of Birth: 1929-09-02  Today's Date: 11/19/2011 Time: 1105-1150 PT Time Calculation (min): 45 min  Visit#: 1  of 8   Re-eval: 12/19/11 Assessment Diagnosis: history of personal falls Next MD Visit: 02/10/2012 Prior Therapy: none  Authorization: [employer] G-code needed  Past Medical History:  Past Medical History  Diagnosis Date  . Anxiety   . Arthritis   . Hyperlipidemia   . Hypertension   . Osteoporosis   . GERD (gastroesophageal reflux disease)    Past Surgical History:  Past Surgical History  Procedure Date  . Appendectomy   . Cholecystectomy   . Abdominal hysterectomy   . Fracture surgery     X 4 LEFT ARM  . Benign cyst removed from kidney and lung, bilateral mastectomy   . ,bilateral great toenail removal   . Knee arthroscopy 2012    right knee, Dr Romeo Apple  . Left forearm   . Breast surgery     bilateral 2000approx  . Tonsillectomy   . R hand surgery   . Bronchoscopy 02/15/2000  . Right vats. "  . Right upper lobe wedge resection. "  . Upper gastrointestinal endoscopy 11/25/1999    Subjective Symptoms/Limitations Symptoms: Ms. Placzek states that she has fallen four times since October.  She has not hurt herself but she is scared that she is going to fracture something.  She is suppose to use her cane but she admitts that she is too proud to use the cane. She states if she stands in one spot her right leg begins to ache and  that her feet feel numb in the mornings.  When questioned about a recent MRI for her lumbar spine she states she had one yesterday.  How long can you stand comfortably?: The patient states she is unable to shop or stand more than five minutes before her legs give completely out.   How long can you walk comfortably?: No greater than ten minutes. Pain Assessment Currently in Pain?: No/denies (The worst pain pain is at an 8/10 when she is standing.) Pain  Location: Back Pain Orientation: Lower Pain Type: Chronic pain Pain Onset: More than a month ago Pain Frequency: Intermittent Pain Relieving Factors: heat Effect of Pain on Daily Activities: increases  Precautions/Restrictions   falls  Prior Functioning  Prior Function Level of Independence: Independent with basic ADLs  Cognition/Observation Cognition Overall Cognitive Status: Appears within functional limits for tasks assessed  Assessment RLE Strength Right Hip Flexion: 3/5 Right Hip Extension: 3-/5 Right Hip ABduction: 3-/5 Right Hip ADduction: 3-/5 Right Knee Flexion: 3/5 Right Knee Extension: 3/5 Right Ankle Dorsiflexion: 4/5 LLE Strength Left Hip Flexion: 3/5 Left Hip Extension: 3-/5 Left Hip ABduction: 3/5 Left Hip ADduction: 3-/5 Left Knee Flexion: 3/5 Left Knee Extension: 3/5 Left Ankle Dorsiflexion: 4/5  Exercise/Treatments Mobility/Balance  Posture/Postural Control Posture/Postural Control: Postural limitations Postural Limitations: rounded shoulder, increased kyphosis, decreased lordosis Berg Balance Test Total Score: 43/56      Seated Other Seated Lumbar Exercises: pelvic floor x10 Other Seated Lumbar Exercises: transverse ab x 10 Supine Bent Knee Raise: 10 reps Bridge: 10 reps  Physical Therapy Assessment and Plan PT Assessment and Plan Clinical Impression Statement: Pt with history of falling, LE weakness and balance disorder who also has chronic LBP.  Pt will benefit from skilled therapy to improve core mm strength which will improve functional tolerance. and improve quality of life. Pt will benefit from skilled therapeutic intervention  in order to improve on the following deficits: Decreased balance;Decreased mobility;Difficulty walking;Pain (falls) Rehab Potential: Good PT Frequency: Min 2X/week PT Duration: 4 weeks PT Treatment/Interventions: Functional mobility training;Therapeutic exercise;Neuromuscular re-education;Patient/family  education PT Plan: Pt to be seen 2x week for four weeks.  Next treatment begin floating SLR, clam, prone heel squeezes...    Goals Home Exercise Program Pt will Perform Home Exercise Program: Independently PT Short Term Goals Time to Complete Short Term Goals: 2 weeks PT Short Term Goal 1: Pt Berg to be increased by 5  PT Short Term Goal 2: back pain decreased by 3 levels PT Short Term Goal 3: Pt to be able to be up for 20 minutes at a time PT Long Term Goals Time to Complete Long Term Goals: 4 weeks PT Long Term Goal 1: Pt to be I in advance HEP PT Long Term Goal 2: Pt to be able to able to be up for 30 minutes at a time Long Term Goal 3: Berg to be increased by 10 Long Term Goal 4: LE strength to be increased by one grade. PT Long Term Goal 5: No falls in three weeks  Problem List Patient Active Problem List  Diagnoses  . PARKINSON'S DISEASE  . ESSENTIAL HYPERTENSION, BENIGN  . GERD  . HEMORRHAGE OF RECTUM AND ANUS  . OSTEOARTHRITIS, KNEE, RIGHT  . ARTHRITIS, LEFT FOOT  . PAIN IN JOINT, HAND  . Pain in joint, pelvic region and thigh  . KNEE PAIN  . SPINAL STENOSIS, LUMBAR  . SCIATICA  . BURSITIS, RIGHT KNEE  . UNSTEADY GAIT  . MEDIAL MENISCUS TEAR, RIGHT  . Other and unspecified hyperlipidemia  . Prediabetes  . Osteoporosis  . Unsteady gait  . Ankle pain, right  . Leg swelling  . Hyperlipidemia    PT - End of Session Activity Tolerance: Patient tolerated treatment well General Behavior During Session: [facility] for tasks performed Cognition: [facility] for tasks performed PT Plan of Care PT Home Exercise Plan: given  PT Patient Instructions: use lumbar support when sitting.  Try to sit as tall as possible as long as possible Consulted and Agree with Plan of Care: Patient  GP  Functional Reporting Modifier  Current Status  [phone] - Mobility: Walking & Moving Around CK - At least 40% but less than 60% impaired, limited or restricted  Goal Status  [id number] - Mobility: [place]  & Moving Around CI - At least 1% but less than 20% impaired, limited or restricted  I chose CK due to mm strength and berg combined.  I believe her back is probably the cause of her LE weakness and falling and this is chronic so I do not feel she will be able to return to no functional deficits. Reford Olliff,CINDY 11/19/2011, 12:41 PM  Physician Documentation Your signature is required to indicate approval of the treatment plan as stated above.  Please sign and either send electronically or make a copy of this report for your files and return this physician signed original.   Please mark one 1.__approve of plan  2. ___approve of plan with the following conditions.   ______________________________                                                          _____________________ Physician Signature  Date

## 2011-11-24 ENCOUNTER — Telehealth: Payer: Self-pay | Admitting: Family Medicine

## 2011-11-24 ENCOUNTER — Ambulatory Visit (HOSPITAL_COMMUNITY)
Admission: RE | Admit: 2011-11-24 | Discharge: 2011-11-24 | Disposition: A | Discharge status: Home / Self Care | Payer: Medicare Other | Source: Ambulatory Visit | Attending: Neurology | Admitting: Neurology

## 2011-11-24 MED ORDER — METOPROLOL SUCCINATE ER 50 MG PO TB24: ORAL_TABLET | ORAL | Status: DC

## 2011-11-24 NOTE — Telephone Encounter (Signed)
Refill sent in

## 2011-11-24 NOTE — Progress Notes (Signed)
Physical Therapy Treatment Patient Details  Name: Erin [name] MRN: [id number] Date of Birth: Jan 25, 1930  Today's Date: 11/24/2011 Time: 4098-1191 PT Time Calculation (min): 37 min Visit#: 2  of 8   Re-eval: 12/19/11 Charges: Therex x 33'  Authorization: MEDICARE G-code needed  Authorization Visit#: 2  of 8    Subjective: Symptoms/Limitations Symptoms: Pt states that she road to [place] this weekend and it increased her back pain. That pain has now subsided. Pain Assessment Currently in Pain?: No/denies   Exercise/Treatments Seated Other Seated Lumbar Exercises: pelvic floor x10 Other Seated Lumbar Exercises: transverse ab x 10 (Pt completes this with ab set as well secondary to TrA weakness) Supine Bent Knee Raise: 10 reps Bridge: 10 reps Straight Leg Raise: 5 reps Sidelying Clam: 5 reps;Limitations Clam Limitations: 10" holds Hip Abduction: 10 reps Prone  Straight Leg Raise:  (Attempted unable to do secondary to weakness) Opposite Arm/Leg Raise: 5 reps;5 seconds Other Prone Lumbar Exercises: Glute set 10x5"  Physical Therapy Assessment and Plan PT Assessment and Plan Clinical Impression Statement: Pt completes therex well with minimal need for cueing after initial instruction. Pt is fatigued easily. Near end of session pt became so fatigued that she request to end her session. Pt rested for a few minutes and then decided to finish a few more exercises. Pt states that she wants to get better and she will not give up. Attempted prone hip ext. Pt unable to complete secondary to weakness. PT Plan: Continue to progress strength and stability per PT POC.     Problem List Patient Active Problem List  Diagnoses  . PARKINSON'S DISEASE  . ESSENTIAL HYPERTENSION, BENIGN  . GERD  . HEMORRHAGE OF RECTUM AND ANUS  . OSTEOARTHRITIS, KNEE, RIGHT  . ARTHRITIS, LEFT FOOT  . PAIN IN JOINT, HAND  . Pain in joint, pelvic region and thigh  . KNEE PAIN  . SPINAL STENOSIS,  LUMBAR  . SCIATICA  . BURSITIS, RIGHT KNEE  . UNSTEADY GAIT  . MEDIAL MENISCUS TEAR, RIGHT  . Other and unspecified hyperlipidemia  . Prediabetes  . Osteoporosis  . Unsteady gait  . Ankle pain, right  . Leg swelling  . Hyperlipidemia    PT - End of Session Activity Tolerance: Patient tolerated treatment well General Behavior During Session: [facility] for tasks performed Cognition: [facility] for tasks performed  Seth Bake, PTA 11/24/2011, 4:52 PM

## 2011-11-30 ENCOUNTER — Ambulatory Visit (HOSPITAL_COMMUNITY)
Admission: RE | Admit: 2011-11-30 | Discharge: 2011-11-30 | Disposition: A | Discharge status: Home / Self Care | Payer: Medicare Other | Source: Ambulatory Visit | Attending: Neurology | Admitting: Neurology

## 2011-11-30 NOTE — Progress Notes (Signed)
Physical Therapy Treatment Patient Details  Name: Erin [name] MRN: [id number] Date of Birth: 1930-06-03  Today's Date: 11/30/2011 Time: 1100-1145 PT Time Calculation (min): 45 min  Visit#: 3  of 8   Re-eval: 12/17/11    Authorization:    Authorization Time Period:    Authorization Visit#:   of     Subjective: Symptoms/Limitations Symptoms: Pt states she is doing her exercises at  home.  Pt sister wanted her to ride to [place] this weekend but pt does not feel she will be able to tolerate the trip.  Precautions/Restrictions     Exercise/Treatments Mobility/Balance        Stretches   Aerobic   Machines for Strengthening   Standing Scapular Retraction: 10 reps;Theraband Theraband Level (Scapular Retraction): Level 3 (Green) Row: Strengthening;10 reps;Theraband Theraband Level (Row): Level 3 (Green) Shoulder Extension: Strengthening;10 reps;Theraband Theraband Level (Shoulder Extension): Level 3 (Green) Other Standing Lumbar Exercises: stand feet together,tandem R,tandem L x 30sec. x 2   Supine Straight Leg Raise:  (floating x 7) Isometric Hip Flexion: 10 reps Other Supine Lumbar Exercises: decompressive ex 1-5 Sidelying Hip Abduction: 10 reps Prone  Straight Leg Raise: 5 reps Other Prone Lumbar Exercises: heel squeeze x 10 Other Prone Lumbar Exercises: shld ext x 10     Physical Therapy Assessment and Plan PT Assessment and Plan Clinical Impression Statement: Pt did well with decompressive, t-band ex although she needed verbal cuing for poper technique.  Pt bagan balance activities today. PT Plan: continue with balance exercises    Goals    Problem List Patient Active Problem List  Diagnoses  . PARKINSON'S DISEASE  . ESSENTIAL HYPERTENSION, BENIGN  . GERD  . HEMORRHAGE OF RECTUM AND ANUS  . OSTEOARTHRITIS, KNEE, RIGHT  . ARTHRITIS, LEFT FOOT  . PAIN IN JOINT, HAND  . Pain in joint, pelvic region and thigh  . KNEE PAIN  . SPINAL STENOSIS,  LUMBAR  . SCIATICA  . BURSITIS, RIGHT KNEE  . UNSTEADY GAIT  . MEDIAL MENISCUS TEAR, RIGHT  . Other and unspecified hyperlipidemia  . Prediabetes  . Osteoporosis  . Unsteady gait  . Ankle pain, right  . Leg swelling  . Hyperlipidemia    PT - End of Session Activity Tolerance: Patient tolerated treatment well PT Plan of Care Consulted and Agree with Plan of Care: Patient  GP No functional reporting required  Shayne Diguglielmo,CINDY 11/30/2011, 11:48 AM

## 2011-12-02 ENCOUNTER — Ambulatory Visit (HOSPITAL_COMMUNITY)
Admission: RE | Admit: 2011-12-02 | Discharge: 2011-12-02 | Disposition: A | Discharge status: Home / Self Care | Payer: Medicare Other | Source: Ambulatory Visit | Attending: Neurology | Admitting: Neurology

## 2011-12-02 NOTE — Progress Notes (Signed)
Physical Therapy Treatment Patient Details  Name: Erin [name] MRN: [id number] Date of Birth: [birth year]  Today's Date: 12/02/2011 Time: 9562-1308 PT Time Calculation (min): 47 min  Visit#: 4  of 8   Re-eval: 12/17/11 Charges: Therex x 25' NMR x 15'  Authorization: MEDICARE G-code needed     Subjective: Symptoms/Limitations Symptoms: Pt reports that she is very sore in her legs from her exercises at home. Pain Assessment Currently in Pain?: No/denies   Exercise/Treatments Standing Scapular Retraction: 10 reps;Theraband Theraband Level (Scapular Retraction): Level 3 (Green) Row: Strengthening;10 reps;Theraband Theraband Level (Row): Level 3 (Green) Shoulder Extension: Strengthening;10 reps;Theraband Theraband Level (Shoulder Extension): Level 3 (Green) Other Standing Lumbar Exercises: stand feet together x30", feet together eyes closedx30" , tandem B 2x30" Other Standing Lumbar Exercises: SLS R10" L21" max Seated Other Seated Lumbar Exercises: sit to stand x 5 Sidelying Clam: 5 reps;Limitations Clam Limitations: 10" holds Hip Abduction: 10 reps Prone  Other Prone Lumbar Exercises: heel squeeze 5x5"  Physical Therapy Assessment and Plan PT Assessment and Plan Clinical Impression Statement: Pt continues to require multimodal cueing to properly completes scapular tband exercises. NMR exercises were progressed this session. Pt is very apprehensive about completing NMR exercises secondary to dear of falling. Pt reports no change in pain at end of session. PT Plan: Continue to progress per PT POC.    Problem List Patient Active Problem List  Diagnoses  . PARKINSON'S DISEASE  . ESSENTIAL HYPERTENSION, BENIGN  . GERD  . HEMORRHAGE OF RECTUM AND ANUS  . OSTEOARTHRITIS, KNEE, RIGHT  . ARTHRITIS, LEFT FOOT  . PAIN IN JOINT, HAND  . Pain in joint, pelvic region and thigh  . KNEE PAIN  . SPINAL STENOSIS, LUMBAR  . SCIATICA  . BURSITIS, RIGHT KNEE  . UNSTEADY GAIT  .  MEDIAL MENISCUS TEAR, RIGHT  . Other and unspecified hyperlipidemia  . Prediabetes  . Osteoporosis  . Unsteady gait  . Ankle pain, right  . Leg swelling  . Hyperlipidemia    PT - End of Session Activity Tolerance: Patient tolerated treatment well General Behavior During Session: [facility] for tasks performed Cognition: [facility] for tasks performed  Seth Bake, PTA 12/02/2011, 12:01 PM

## 2011-12-07 ENCOUNTER — Other Ambulatory Visit: Payer: Self-pay

## 2011-12-07 ENCOUNTER — Ambulatory Visit (HOSPITAL_COMMUNITY)
Admission: RE | Admit: 2011-12-07 | Discharge: 2011-12-07 | Disposition: A | Discharge status: Home / Self Care | Payer: Medicare Other | Source: Ambulatory Visit | Attending: Family Medicine | Admitting: Family Medicine

## 2011-12-07 MED ORDER — AMLODIPINE BESYLATE 5 MG PO TABS: ORAL_TABLET | ORAL | Status: DC

## 2011-12-07 NOTE — Progress Notes (Signed)
Physical Therapy Treatment Patient Details  Name: Erin [name] MRN: [id number] Date of Birth: [birth year]  Today's Date: 12/07/2011 Time: 1015-1108 PT Time Calculation (min): 53 min  Visit#: 5  of 8   Re-eval: 12/17/11   Authorization Visit#: 3  of 8    Subjective: Symptoms/Limitations Symptoms: Pt continues to be sore.  Exercise/Treatments   Standing Scapular Retraction: 15 reps;Theraband Theraband Level (Scapular Retraction): Level 3 (Green) Row: 15 reps;Theraband Theraband Level (Row): Level 3 (Green) Shoulder Extension: 15 reps;Theraband Theraband Level (Shoulder Extension): Level 3 (Green) Other Standing Lumbar Exercises: feet together eyes closed move UE without back  Other Standing Lumbar Exercises: SLS; retro gait, tandem stance and tandem gait.   Sidelying Clam:  (7 reps ) Hip Abduction: 10 reps Prone  Straight Leg Raise: 10 reps Other Prone Lumbar Exercises:  (heel squeeze/ chin tuck, rows x 10)     Physical Therapy Assessment and Plan PT Assessment and Plan Clinical Impression Statement: Pt continues to need cuing but is improving in technique.  Pt had prone chin tuck (unable to complete chin tuck head raise correctly), prone rows added to program. PT Plan: continue with strengthening and neuro re-ed.  Begin foam cone rotation next treatment.    Goals    Problem List Patient Active Problem List  Diagnoses  . PARKINSON'S DISEASE  . ESSENTIAL HYPERTENSION, BENIGN  . GERD  . HEMORRHAGE OF RECTUM AND ANUS  . OSTEOARTHRITIS, KNEE, RIGHT  . ARTHRITIS, LEFT FOOT  . PAIN IN JOINT, HAND  . Pain in joint, pelvic region and thigh  . KNEE PAIN  . SPINAL STENOSIS, LUMBAR  . SCIATICA  . BURSITIS, RIGHT KNEE  . UNSTEADY GAIT  . MEDIAL MENISCUS TEAR, RIGHT  . Other and unspecified hyperlipidemia  . Prediabetes  . Osteoporosis  . Unsteady gait  . Ankle pain, right  . Leg swelling  . Hyperlipidemia    PT - End of Session Equipment Utilized During  Treatment: Gait belt Activity Tolerance: Patient tolerated treatment well General Behavior During Session: [facility] for tasks performed Cognition: [facility] for tasks performed  GP No functional reporting required  Doyle Kunath,CINDY 12/07/2011, 11:14 AM

## 2011-12-09 ENCOUNTER — Ambulatory Visit (HOSPITAL_COMMUNITY)
Admission: RE | Admit: 2011-12-09 | Discharge: 2011-12-09 | Disposition: A | Discharge status: Home / Self Care | Payer: Medicare Other | Source: Ambulatory Visit | Attending: Neurology | Admitting: Neurology

## 2011-12-09 NOTE — Progress Notes (Signed)
Physical Therapy Treatment Patient Details  Name: Erin [name] MRN: [id number] Date of Birth: 05/18/1930  Today's Date: 12/09/2011 Time: 1191-4782 PT Time Calculation (min): 43 min  Visit#: 6  of 8   Re-eval: 12/17/11 Charges: Therex x 10' NMR x 29'  Authorization: MEDICARE G-code needed   Subjective: Symptoms/Limitations Symptoms: I almost fell in the kitchen last weekend. My leg gave out but I caught myself. Pain Assessment Currently in Pain?: No/denies   Exercise/Treatments Standing Scapular Retraction: 15 reps;Theraband Theraband Level (Scapular Retraction): Level 3 (Green) Row: 15 reps;Theraband Theraband Level (Row): Level 3 (Green) Shoulder Extension: 15 reps;Theraband Theraband Level (Shoulder Extension): Level 3 (Green) Other Standing Lumbar Exercises: Cone rotation on foam 1 RT B Other Standing Lumbar Exercises: Retra tandemt gait 2 RT; fwd tandem gait 2 RT; balance beam 1 RT Seated Other Seated Lumbar Exercises: sit to stand 2x5 w/o UE assist  Physical Therapy Assessment and Plan PT Assessment and Plan Clinical Impression Statement: Pt's technique with scapular tband exercises has improved but she continues to require min multimodal cueing. Pt lacks confidence and is very apprehensive about completing balance activities. Pt says "Don't let me fall" multiple times throughout session. Pt had multiple LOB with balance activities but was able to recover independently or with minimal assistance. Pt is without complaint throughout session. Pt reports 0/10 pain at end of session. PT Plan: Continue to progress strengthening and balance per PT POC.     Problem List Patient Active Problem List  Diagnoses  . PARKINSON'S DISEASE  . ESSENTIAL HYPERTENSION, BENIGN  . GERD  . HEMORRHAGE OF RECTUM AND ANUS  . OSTEOARTHRITIS, KNEE, RIGHT  . ARTHRITIS, LEFT FOOT  . PAIN IN JOINT, HAND  . Pain in joint, pelvic region and thigh  . KNEE PAIN  . SPINAL STENOSIS, LUMBAR  .  SCIATICA  . BURSITIS, RIGHT KNEE  . UNSTEADY GAIT  . MEDIAL MENISCUS TEAR, RIGHT  . Other and unspecified hyperlipidemia  . Prediabetes  . Osteoporosis  . Unsteady gait  . Ankle pain, right  . Leg swelling  . Hyperlipidemia    PT - End of Session Activity Tolerance: Patient tolerated treatment well General Behavior During Session: [facility] for tasks performed Cognition: [facility] for tasks performed   Seth Bake, PTA 12/09/2011, 12:01 PM

## 2011-12-10 ENCOUNTER — Other Ambulatory Visit: Payer: Self-pay | Admitting: Urology

## 2011-12-10 DIAGNOSIS — Q619 Cystic kidney disease, unspecified: Secondary | ICD-10-CM

## 2011-12-14 ENCOUNTER — Other Ambulatory Visit: Payer: Self-pay | Admitting: Family Medicine

## 2011-12-14 ENCOUNTER — Ambulatory Visit (HOSPITAL_COMMUNITY): Payer: Medicare Other

## 2011-12-16 ENCOUNTER — Other Ambulatory Visit: Payer: Self-pay

## 2011-12-16 ENCOUNTER — Ambulatory Visit (HOSPITAL_COMMUNITY)
Admission: RE | Admit: 2011-12-16 | Discharge: 2011-12-16 | Disposition: A | Discharge status: Home / Self Care | Payer: Medicare Other | Source: Ambulatory Visit | Attending: Family Medicine | Admitting: Family Medicine

## 2011-12-16 DIAGNOSIS — R269 Unspecified abnormalities of gait and mobility: Secondary | ICD-10-CM | POA: Insufficient documentation

## 2011-12-16 DIAGNOSIS — I10 Essential (primary) hypertension: Secondary | ICD-10-CM | POA: Insufficient documentation

## 2011-12-16 DIAGNOSIS — M545 Low back pain, unspecified: Secondary | ICD-10-CM | POA: Insufficient documentation

## 2011-12-16 DIAGNOSIS — IMO0001 Reserved for inherently not codable concepts without codable children: Secondary | ICD-10-CM | POA: Insufficient documentation

## 2011-12-16 DIAGNOSIS — E785 Hyperlipidemia, unspecified: Secondary | ICD-10-CM | POA: Insufficient documentation

## 2011-12-16 MED ORDER — METOPROLOL SUCCINATE ER 50 MG PO TB24: ORAL_TABLET | ORAL | Status: DC

## 2011-12-16 MED ORDER — LOSARTAN POTASSIUM-HCTZ 50-12.5 MG PO TABS: 1.0000 | ORAL_TABLET | Freq: Every day | ORAL | Status: DC

## 2011-12-16 MED ORDER — AMLODIPINE BESYLATE 5 MG PO TABS: ORAL_TABLET | ORAL | Status: DC

## 2011-12-16 NOTE — Progress Notes (Signed)
Physical Therapy Treatment Patient Details  Name: Erin [name] MRN: [id number] Date of Birth: 1930-01-31  Today's Date: 12/16/2011 Time: 1100-1145 PT Time Calculation (min): 45 min  Visit#: 7  of 8   Re-eval: 12/17/11  Charge: therex 33 min NMR 12 min  Authorization: MEDICARE  Authorization Time Period: Current status per initial eval: CK, goal: CI  Authorization Visit#: 7  of 8    Subjective: Symptoms/Limitations Symptoms: No pain at entrance today, pt reported she felt like her overall strength and balance are improving. Pain Assessment Currently in Pain?: No/denies  Objective:   Exercise/Treatments Standing Functional Squats: 15 reps Scapular Retraction: 15 reps;Theraband Theraband Level (Scapular Retraction): Level 3 (Green) Row: 15 reps;Theraband Theraband Level (Row): Level 3 (Green) Shoulder Extension: 15 reps;Theraband Theraband Level (Shoulder Extension): Level 3 (Green) Other Standing Lumbar Exercises: Cone rotation on foam 1 RT B R/L and A/P; 1-15 standing on foam Other Standing Lumbar Exercises: Retra tandemt gait 2 RT; fwd tandem gait 1 RT; balance beam 2 RT Seated Other Seated Lumbar Exercises: sit to stand 2x5 w/o UE assist  Physical Therapy Assessment and Plan PT Assessment and Plan Clinical Impression Statement: Pt improving balance on static surface, pt maintains balance independently before advancing next LE requiring no cueing for safety mechanics. Pt with difficulty on dynamic surfaces, requiring min assistance and vc-ing to look up.  Added new activity to address reaching outside BOS to regain COG on dynamic surface, pt required min assistance with new activity.  Pt unconfident with balance activities, she stated "Don't let me fall" multiple times throughout session. PT Plan: Re-eval next session, GCODE needed as well.    Goals    Problem List Patient Active Problem List  Diagnoses  . PARKINSON'S DISEASE  . ESSENTIAL HYPERTENSION, BENIGN  .  GERD  . HEMORRHAGE OF RECTUM AND ANUS  . OSTEOARTHRITIS, KNEE, RIGHT  . ARTHRITIS, LEFT FOOT  . PAIN IN JOINT, HAND  . Pain in joint, pelvic region and thigh  . KNEE PAIN  . SPINAL STENOSIS, LUMBAR  . SCIATICA  . BURSITIS, RIGHT KNEE  . UNSTEADY GAIT  . MEDIAL MENISCUS TEAR, RIGHT  . Other and unspecified hyperlipidemia  . Prediabetes  . Osteoporosis  . Unsteady gait  . Ankle pain, right  . Leg swelling  . Hyperlipidemia    PT - End of Session Equipment Utilized During Treatment: Gait belt Activity Tolerance: Patient tolerated treatment well General Behavior During Session: [facility] for tasks performed Cognition: [facility] for tasks performed  GP No functional reporting required  Juel Burrow, PTA 12/16/2011, 12:50 PM

## 2011-12-17 ENCOUNTER — Other Ambulatory Visit: Payer: Self-pay

## 2011-12-17 ENCOUNTER — Telehealth: Payer: Self-pay | Admitting: Family Medicine

## 2011-12-17 ENCOUNTER — Other Ambulatory Visit: Payer: Self-pay | Admitting: Family Medicine

## 2011-12-17 MED ORDER — PANTOPRAZOLE SODIUM 40 MG PO TBEC: 40.0000 mg | DELAYED_RELEASE_TABLET | Freq: Every day | ORAL | Status: DC

## 2011-12-17 MED ORDER — EZETIMIBE 10 MG PO TABS: 10.0000 mg | ORAL_TABLET | Freq: Every day | ORAL | Status: DC

## 2011-12-17 MED ORDER — PRAVASTATIN SODIUM 40 MG PO TABS: 40.0000 mg | ORAL_TABLET | Freq: Every day | ORAL | Status: DC

## 2011-12-17 NOTE — Telephone Encounter (Signed)
See previous message. Already taken care of

## 2011-12-17 NOTE — Telephone Encounter (Signed)
I have entered pravastatin, pantoprazole and zetia. Person to contact at [facility] is Dawn, [phone], she is the Manufacturing systems engineer.  Pls let her know I changed from simvastatin due to [potential interaction with amlodipine to pravastatin, she will need to explain to pt. If they want 90 day supply, please make the change for me, thanks.  I will leave a copy of her letter with you also

## 2011-12-17 NOTE — Telephone Encounter (Signed)
Message from [facility] requesting change to pantoprazole, zetia and simvastatin for this pt due to cost, requested we contact her if I agree.She will in turn contact pt. I do agree and will enter the medication historically you can print and send at appropriate time please

## 2011-12-17 NOTE — Telephone Encounter (Signed)
Dawn aware and faxed in the changes to [employer] and she will notify the patient of the changes

## 2011-12-21 ENCOUNTER — Telehealth: Payer: Self-pay | Admitting: Family Medicine

## 2011-12-21 ENCOUNTER — Ambulatory Visit (HOSPITAL_COMMUNITY)
Admission: RE | Admit: 2011-12-21 | Discharge: 2011-12-21 | Disposition: A | Discharge status: Home / Self Care | Payer: Medicare Other | Source: Ambulatory Visit | Attending: Family Medicine | Admitting: Family Medicine

## 2011-12-21 NOTE — Progress Notes (Addendum)
Physical Therapy Treatment Patient Details  Name: Erin [name] MRN: [id number] Date of Birth: 12-31-1929  Today's Date: 12/21/2011 Time: 1106-1130 PT Time Calculation (min): 24 min  Visit#: 8  of 8   Re-eval: Next session Charges: Therex x 10' NMR x 8'  Authorization: MEDICARE  Authorization Time Period: Current status per initial eval: CK, goal: CI  Authorization Visit#: 8  of 8    Subjective: Symptoms/Limitations Symptoms: "I don't feel like myself today." Pain Assessment Currently in Pain?: No/denies   Exercise/Treatments Standing Scapular Retraction: 15 reps;Theraband Theraband Level (Scapular Retraction): Level 3 (Green) Row: 15 reps;Theraband Theraband Level (Row): Level 3 (Green) Shoulder Extension: 15 reps;Theraband Theraband Level (Shoulder Extension): Level 3 (Green) Other Standing Lumbar Exercises: Retra tandemt gait 2 RT; fwd tandem gait 1 RT Seated Other Seated Lumbar Exercises: sit to stand x10 w/o UE assist  Physical Therapy Assessment and Plan PT Assessment and Plan Clinical Impression Statement: Began tx with therex and NMR. While completing NMR pt requested to have her blood pressure taken. Pt states that her blood pressure was high this morning. Blood pressure was measured at 102/70 during session. Pt states this is very low for her. Pt reports that she does not feel like herself but denies dizziness or lightheadedness. Pt requests to discontinue therapy this session so that she can go home and rest. Unable to complete re-eval this session as pt requested to leave. Pt advised to check blood pressure throughout the day and if it does not improve to consult MD. PT Plan: Re-eval next session, GCODE needed as well.     Problem List Patient Active Problem List  Diagnoses  . PARKINSON'S DISEASE  . ESSENTIAL HYPERTENSION, BENIGN  . GERD  . HEMORRHAGE OF RECTUM AND ANUS  . OSTEOARTHRITIS, KNEE, RIGHT  . ARTHRITIS, LEFT FOOT  . PAIN IN JOINT, HAND  .  Pain in joint, pelvic region and thigh  . KNEE PAIN  . SPINAL STENOSIS, LUMBAR  . SCIATICA  . BURSITIS, RIGHT KNEE  . UNSTEADY GAIT  . MEDIAL MENISCUS TEAR, RIGHT  . Other and unspecified hyperlipidemia  . Prediabetes  . Osteoporosis  . Unsteady gait  . Ankle pain, right  . Leg swelling  . Hyperlipidemia    PT - End of Session Activity Tolerance: Treatment limited secondary to medical complications (Comment) (low blood pressure) General Behavior During Session: [facility] for tasks performed Cognition: [facility] for tasks performed  Seth Bake, PTA 12/21/2011, 11:56 AM

## 2011-12-21 NOTE — Telephone Encounter (Signed)
Called patient and was no option to leave message

## 2011-12-21 NOTE — Telephone Encounter (Signed)
Pt came to the office and I explained the new meds to her and she understood

## 2011-12-23 ENCOUNTER — Ambulatory Visit (HOSPITAL_COMMUNITY)
Admission: RE | Admit: 2011-12-23 | Discharge: 2011-12-23 | Disposition: A | Discharge status: Home / Self Care | Payer: Medicare Other | Source: Ambulatory Visit | Attending: Family Medicine | Admitting: Family Medicine

## 2011-12-23 NOTE — Evaluation (Signed)
Physical Therapy Evaluation  Patient Details  Name: Erin [name] MRN: [id number] Date of Birth: 06-24-1930  Today's Date: 12/23/2011 Time: 1100-1145 PT Time Calculation (min): 45 min Charges:  1 MMT, 15' PPT, 25' TE, Self Care: 10' Visit#: 9  of 16   Re-eval: 01/22/12    Authorization: MEDICARE  Authorization Time Period:    Authorization Visit#: 0  of 10    Past Medical History:  Past Medical History  Diagnosis Date  . Anxiety   . Arthritis   . Hyperlipidemia   . Hypertension   . Osteoporosis   . GERD (gastroesophageal reflux disease)    Past Surgical History:  Past Surgical History  Procedure Date  . Appendectomy   . Cholecystectomy   . Abdominal hysterectomy   . Fracture surgery     X 4 LEFT ARM  . Benign cyst removed from kidney and lung, bilateral mastectomy   . ,bilateral great toenail removal   . Knee arthroscopy 2012    right knee, Dr Romeo Apple  . Left forearm   . Breast surgery     bilateral 2000approx  . Tonsillectomy   . R hand surgery   . Bronchoscopy 02/15/2000  . Right vats. "  . Right upper lobe wedge resection. "  . Upper gastrointestinal endoscopy 11/25/1999    Subjective Symptoms/Limitations Symptoms: Pt states that she feels more stable when she walks, but also reports that she is not walking as much because she is still scared she is going to fall.  Pain Assessment Currently in Pain?: Yes Pain Score:   4 Pain Location: Back Pain Orientation: Lower  Sensation/Coordination/Flexibility/Functional Tests Functional Tests Functional Tests: ABC - 24%  Assessment RLE Strength Right Hip Flexion: 4/5 (was 3/5) Right Hip Extension: 3/5 (was 3-/5) Right Hip ABduction: 3+/5 (was 3-/5) Right Knee Flexion: 4/5 (was 3/5) Right Knee Extension: 3+/5 (was 3/5) Right Ankle Dorsiflexion: 5/5 (was 4/5) LLE Strength Left Hip Flexion: 3+/5 (was 3/5) Left Hip Extension: 3/5 (was 3-/5) Left Hip ABduction: 3/5 (was 3/5) Left Knee Flexion: 4/5  (4+/5, was 3/5) Left Knee Extension:  (was 3/5) Left Ankle Dorsiflexion: 5/5 (was 4/5)  Mobility/Balance  Posture/Postural Control Postural Limitations: rounded shoulder, increased kyphosis, decreased lordosis Berg Balance Test Sit to Stand: Able to stand  independently using hands Standing Unsupported: Able to stand safely 2 minutes Sitting with Back Unsupported but Feet Supported on Floor or Stool: Able to sit safely and securely 2 minutes Stand to Sit: Sits safely with minimal use of hands Transfers: Able to transfer safely, definite need of hands Standing Unsupported with Eyes Closed: Able to stand 10 seconds safely Standing Ubsupported with Feet Together: Able to place feet together independently and stand 1 minute safely From Standing, Reach Forward with Outstretched Arm: Can reach forward >12 cm safely (5") From Standing Position, Pick up Object from Floor: Able to pick up shoe safely and easily From Standing Position, Turn to Look Behind Over each Shoulder: Looks behind from both sides and weight shifts well Turn 360 Degrees: Able to turn 360 degrees safely in 4 seconds or less Standing Unsupported, Alternately Place Feet on Step/Stool: Able to stand independently and safely and complete 8 steps in 20 seconds Standing Unsupported, One Foot in Front: Able to take small step independently and hold 30 seconds Standing on One Leg: Able to lift leg independently and hold > 10 seconds Total Score: 51    Exercise/Treatments Standing Functional Squats: Other (comment) (provided picture for HEP) Seated Other Seated Lumbar Exercises:  Provided picture of heel and toe raises  Supine Bridge: 10 reps;Other (comment) (5 sec hold) Sidelying Hip Abduction: 10 reps (reports increased groin pain to LLE when she completes these) Prone  Straight Leg Raise: 10 reps;Other (comment) (BLE) Other Prone Lumbar Exercises: heel squeeze 5x5 sec hold Aerobic Bike x8' 2.5    Physical Therapy  Assessment and Plan PT Assessment and Plan Clinical Impression Statement: Re-eval complete, g-code updated.  Erin [name] has attended 8 OP PT visits for abn gait with the following findings: met 2/4 STG, 1/4 LTG.  mild improvement in LE strength, self report of feeling more confident with walking short distances, decreased falls, Berg 51/56.  However pt continues to also self report that her legs feel weak and she is unable to complete certain exercises secondary to LBP. Pt will benefit from skilled therapeutic intervention in order to improve on the following deficits: Decreased activity tolerance;Decreased balance;Decreased mobility;Decreased strength;Difficulty walking;Pain;Impaired perceived functional ability Rehab Potential: Good PT Frequency: Min 2X/week PT Duration: 4 weeks PT Treatment/Interventions: Gait training PT Plan: Continue with LE strengthening exercises that do not cause increased pain. Pt will benefit from general core excerises to help with LE strength.  Add S/L clams, ab sets, bridges, hamstring bridges on ball, cybex.     Goals Home Exercise Program Pt will Perform Home Exercise Program: Independently PT Short Term Goals Time to Complete Short Term Goals: 2 weeks PT Short Term Goal 1: Pt Berg to be increased by 5  PT Short Term Goal 1 - Progress: Met PT Short Term Goal 2: back pain decreased by 3 levels PT Short Term Goal 2 - Progress: Not met PT Short Term Goal 3: Pt to be able to be up for 20 minutes at a time PT Short Term Goal 3 - Progress: Progressing toward goal (Pt has to sit down after 10') PT Long Term Goals Time to Complete Long Term Goals: 4 weeks PT Long Term Goal 1: Pt to be I in advance HEP PT Long Term Goal 1 - Progress: Met PT Long Term Goal 2: Pt to be able to able to be up for 30 minutes at a time PT Long Term Goal 2 - Progress: Progressing toward goal Long Term Goal 3: Berg to be increased by 10 Long Term Goal 3 Progress: Progressing toward goal  (Improved by 8) Long Term Goal 4: LE strength to be increased by one grade. PT Long Term Goal 5: No falls in three weeks  Long Term Goal 5 Progress: Met Additional PT Long Term Goals?: Yes PT Long Term Goal 6: New Goal 12/23/11: Pt will improve her ABC to at least 40% for improved percieved functional ability.   Problem List Patient Active Problem List  Diagnosis  . PARKINSON'S DISEASE  . ESSENTIAL HYPERTENSION, BENIGN  . GERD  . HEMORRHAGE OF RECTUM AND ANUS  . OSTEOARTHRITIS, KNEE, RIGHT  . ARTHRITIS, LEFT FOOT  . PAIN IN JOINT, HAND  . Pain in joint, pelvic region and thigh  . KNEE PAIN  . SPINAL STENOSIS, LUMBAR  . SCIATICA  . BURSITIS, RIGHT KNEE  . UNSTEADY GAIT  . MEDIAL MENISCUS TEAR, RIGHT  . Other and unspecified hyperlipidemia  . Prediabetes  . Osteoporosis  . Unsteady gait  . Ankle pain, right  . Leg swelling  . Hyperlipidemia    PT - End of Session Activity Tolerance: Treatment limited secondary to medical complications (Comment) (low blood pressure) General Behavior During Session: [facility] for tasks performed Cognition: [facility] for tasks  performed PT Plan of Care PT Home Exercise Plan: see updated report PT Patient Instructions: importance of core musculature for support Consulted and Agree with Plan of Care: Patient  GP  Functional Reporting Modifier  Current Status  [phone] - Mobility: Walking & Moving Around CK - At least 40% but less than 60% impaired, limited or restricted  Goal Status  [id number] - Mobility: [place] & Moving Around CI - At least 1% but less than 20% impaired, limited or restricted   Simya Tercero 12/23/2011, 2:58 PM  Physician Documentation Your signature is required to indicate approval of the treatment plan as stated above.  Please sign and either send electronically or make a copy of this report for your files and return this physician signed original.   Please mark one 1.__approve of plan  2. ___approve of plan with the following  conditions.   ______________________________                                                          _____________________ Physician Signature                                                                                                             Date

## 2011-12-28 ENCOUNTER — Ambulatory Visit (HOSPITAL_COMMUNITY)
Admission: RE | Admit: 2011-12-28 | Discharge: 2011-12-28 | Disposition: A | Discharge status: Home / Self Care | Payer: Medicare Other | Source: Ambulatory Visit | Attending: Family Medicine | Admitting: Family Medicine

## 2011-12-28 NOTE — Progress Notes (Signed)
Physical Therapy Treatment Patient Details  Name: Erin [name] MRN: [id number] Date of Birth: 1930-01-27  Today's Date: 12/28/2011 Time: 4098-1191 PT Time Calculation (min): 49 min  Visit#: 10  of 16   Re-eval: 01/22/12  Charge: therex 49 min  Authorization: MEDICARE  Authorization Time Period: Current status per initial eval: CK, goal: CI   Authorization Visit#: 11  of 16    Subjective: Symptoms/Limitations Symptoms: Pt upset with Medicare letter received, pain increases rolling over in bed in R groin area, pain scale 3/10 today.  No back pain today. Pain Assessment Currently in Pain?: Yes Pain Score:   3 Pain Location: Groin Pain Orientation: Right  Objective:   Exercise/Treatments Stretches Hip Flexor Stretch: 2 reps;30 seconds Machines for Strengthening Cybex Knee Extension: 1.5 Pl 10 reps Cybex Knee Flexion: 2.5 Pl 10 reps Standing Functional Squat: 2 sets;10 reps;Other (comment) (1 set functional squats, 1 set proper lifting with orange ba) Seated Other Seated Knee Exercises: Butterfly adductor st 1x 30" Supine Bridges: 10 reps;Other (comment) (on green theraball) Other Supine Knee Exercises: PFC 10x 5" with tactile cueing for correct mm activation Sidelying Hip ABduction: Both;10 reps Clams: B LE 10x 5" holds with tactile cueing  Prone  Hip Extension: 10 reps Other Prone Exercises: heel squeeze 10x 5"  Physical Therapy Assessment and Plan PT Assessment and Plan Clinical Impression Statement: Treatment focus on core and LE strengthening per findings last sessions re-eval.  Pt able to complete activities with good form noted though did require tactile cueing for correct mm activation with PFC and S/L clam.  Pt c/o increased pain R groin with bed mobility and when getting positioned for cybex knee extension.  Added hip flexor and adductor stretch, pt stated pain resolved at end of session.  Pt stated personal goal, pt expressed unconfidence with ambulation for  long periods of time and on dynamic surfaces.   PT Plan: Continue with core and LE strengthening. Begin outdoor ambulation next session to improve pt safety and confidence with gait to improve overall QOL.    Goals    Problem List Patient Active Problem List  Diagnosis  . PARKINSON'S DISEASE  . ESSENTIAL HYPERTENSION, BENIGN  . GERD  . HEMORRHAGE OF RECTUM AND ANUS  . OSTEOARTHRITIS, KNEE, RIGHT  . ARTHRITIS, LEFT FOOT  . PAIN IN JOINT, HAND  . Pain in joint, pelvic region and thigh  . KNEE PAIN  . SPINAL STENOSIS, LUMBAR  . SCIATICA  . BURSITIS, RIGHT KNEE  . UNSTEADY GAIT  . MEDIAL MENISCUS TEAR, RIGHT  . Other and unspecified hyperlipidemia  . Prediabetes  . Osteoporosis  . Unsteady gait  . Ankle pain, right  . Leg swelling  . Hyperlipidemia    PT - End of Session Activity Tolerance: Patient tolerated treatment well General Behavior During Session: [facility] for tasks performed Cognition: [facility] for tasks performed  GP No functional reporting required  Juel Burrow, PTA 12/28/2011, 1:37 PM

## 2011-12-30 ENCOUNTER — Ambulatory Visit (HOSPITAL_COMMUNITY)
Admission: RE | Admit: 2011-12-30 | Discharge: 2011-12-30 | Disposition: A | Discharge status: Home / Self Care | Payer: Medicare Other | Source: Ambulatory Visit | Attending: Family Medicine | Admitting: Family Medicine

## 2011-12-30 NOTE — Progress Notes (Signed)
Physical Therapy Treatment Patient Details  Name: Erin [name] MRN: [id number] Date of Birth: 12-28-1929  Today's Date: 12/30/2011 Time: 1102-1145 PT Time Calculation (min): 43 min Visit#: 11  of 16   Re-eval: 01/21/12  Charges:  therex 40'    Authorization: MEDICARE  Authorization Time Period: Current status per initial eval: CK, goal: CI   Authorization Visit#: 11  of 16    Subjective: Symptoms/Limitations Symptoms: Pt. states she made it to church on Sunday.  States she had alot of pain when getting up from the pew at the end of service, all in her R groin area.  Currently with 5/10 pain. Pain Assessment Currently in Pain?: Yes Pain Score:   5   Exercise/Treatments Stretches Hip Flexor Stretch: 2 reps;30 seconds;Limitations Hip Flexor Stretch Limitations: supine, bilateral Machines for Strengthening Cybex Knee Extension: 1.5 Pl  2X10 reps Cybex Knee Flexion: 2.5 Pl 2X10 reps Standing Functional Squat: 10 reps;2 sets;Limitations Functional Squat Limitations: one set functional squats, one set with ball to 8"step Seated Other Seated Knee Exercises: Supine Butterfly adductor st 1x 30" Supine Bridges: 10 reps;Other (comment) Other Supine Knee Exercises: PFC 10x 5" with tactile cueing for correct mm activation Sidelying Hip ABduction: Both;10 reps Clams: B LE 10x 5" holds with tactile cueing  Prone  Hip Extension: 10 reps;Both Other Prone Exercises: heel squeeze 10x 5"    Physical Therapy Assessment and Plan PT Assessment and Plan Clinical Impression Statement: Continued focus on core and LE strengthening.  Pt. with noted weakness in hip extensors, early fatique in glute med muscles.  Pt. with overall decreased pain at end of session, down to 3/10.  Most pain reported in R groin area. PT Plan:  Begin outdoor ambulation next session to improve pt safety and confidence with gait to improve overall QOL.     Problem List Patient Active Problem List  Diagnosis  .  PARKINSON'S DISEASE  . ESSENTIAL HYPERTENSION, BENIGN  . GERD  . HEMORRHAGE OF RECTUM AND ANUS  . OSTEOARTHRITIS, KNEE, RIGHT  . ARTHRITIS, LEFT FOOT  . PAIN IN JOINT, HAND  . Pain in joint, pelvic region and thigh  . KNEE PAIN  . SPINAL STENOSIS, LUMBAR  . SCIATICA  . BURSITIS, RIGHT KNEE  . UNSTEADY GAIT  . MEDIAL MENISCUS TEAR, RIGHT  . Other and unspecified hyperlipidemia  . Prediabetes  . Osteoporosis  . Unsteady gait  . Ankle pain, right  . Leg swelling  . Hyperlipidemia    PT - End of Session Activity Tolerance: Patient tolerated treatment well General Behavior During Session: [facility] for tasks performed Cognition: [facility] for tasks performed  GP No functional reporting required  Lurena Nida, PTA/CLT 12/30/2011, 11:53 AM

## 2012-01-04 ENCOUNTER — Ambulatory Visit (HOSPITAL_COMMUNITY)
Admission: RE | Admit: 2012-01-04 | Discharge: 2012-01-04 | Disposition: A | Discharge status: Home / Self Care | Payer: Medicare Other | Source: Ambulatory Visit | Attending: Family Medicine | Admitting: Family Medicine

## 2012-01-04 NOTE — Progress Notes (Signed)
Physical Therapy Treatment Patient Details  Name: Erin [name] MRN: [id number] Date of Birth: May 05, 1930  Today's Date: 01/04/2012 Time: 4098-1191 PT Time Calculation (min): 42 min  Visit#: 12  of 16   Re-eval: 01/21/12 Charges: Therex x 38'  Authorization: MEDICARE  Authorization Time Period: Current status per initial eval: CK, goal: CI   Authorization Visit#: 12  of 16    Subjective: Symptoms/Limitations Symptoms: Pt states that she is very tired. She's been taking care of her 76y/o brother. Pain Assessment Currently in Pain?: No/denies   Exercise/Treatments Standing Functional Squats: 15 reps Other Standing Lumbar Exercises: Ambulation outside on ramps, curbs, and grass to improve balance and activity Seated Other Seated Lumbar Exercises: sit to stand x 10 w/o UE assist Supine Bridge: 10 reps;5 seconds Sidelying Clam: 10 reps Clam Limitations: 10" holds Hip Abduction: 10 reps (L only pt too fatigued to complete on R side)  Physical Therapy Assessment and Plan PT Assessment and Plan Clinical Impression Statement: Pt very easily fatigued this session. Pt states that she has been taking care of her brother who recently fell and that has made her very tired. Pt requests to stop mat exercises because she is so tired. Pt complains of dizziness with position changes. B/P 110/70. Pt states that dizziness had subsided at end of session. PT Plan: Conitnue to progress per PT POC.     Problem List Patient Active Problem List  Diagnosis  . PARKINSON'S DISEASE  . ESSENTIAL HYPERTENSION, BENIGN  . GERD  . HEMORRHAGE OF RECTUM AND ANUS  . OSTEOARTHRITIS, KNEE, RIGHT  . ARTHRITIS, LEFT FOOT  . PAIN IN JOINT, HAND  . Pain in joint, pelvic region and thigh  . KNEE PAIN  . SPINAL STENOSIS, LUMBAR  . SCIATICA  . BURSITIS, RIGHT KNEE  . UNSTEADY GAIT  . MEDIAL MENISCUS TEAR, RIGHT  . Other and unspecified hyperlipidemia  . Prediabetes  . Osteoporosis  . Unsteady gait  .  Ankle pain, right  . Leg swelling  . Hyperlipidemia    PT - End of Session Activity Tolerance: Patient tolerated treatment well General Behavior During Session: [facility] for tasks performed Cognition: [facility] for tasks performed   Seth Bake, PTA 01/04/2012, 4:58 PM

## 2012-01-06 ENCOUNTER — Ambulatory Visit (HOSPITAL_COMMUNITY)
Admission: RE | Admit: 2012-01-06 | Discharge: 2012-01-06 | Disposition: A | Discharge status: Home / Self Care | Payer: Medicare Other | Source: Ambulatory Visit | Attending: Family Medicine | Admitting: Family Medicine

## 2012-01-06 NOTE — Progress Notes (Signed)
Physical Therapy Treatment Patient Details  Name: Erin [name] MRN: [id number] Date of Birth: Feb 07, 1930  Today's Date: 01/06/2012 Time: 1106-1159 PT Time Calculation (min): 53 min  Visit#: 13  of 16   Re-eval: 01/17/12    Authorization:    Authorization Time Period:    Authorization Visit#:   of     Subjective: Symptoms/Limitations Symptoms: Pt c/o being dizzy with lying down and retruning to sit.  BP taken 124/78   Exercise/Treatments     Aerobic Stationary Bike: 7' @ 3.0 Machines for Strengthening Cybex Leg Press: 8Pl x 10   Standing Lunge Walking - Round Trips: 2 1/4 squat Rocker Board: Other (comment) (side to side and forward and back x 10 each) Gait Training: tandem gt/retro toe to heel gt x 2 Other Standing Knee Exercises: t-band for posture blue x 10 Other Standing Knee Exercises: walking outside steps w/no hands/ grass and inclines Seated Other Seated Knee Exercises: sit to stand x 10 B Supine Bridges: 10 reps;Limitations Bridges Limitations: on ball Sidelying Clams: x 15 @     Physical Therapy Assessment and Plan PT Assessment and Plan Clinical Impression Statement: Pt completed exercises well better tolerance to exercise.    Goals    Problem List Patient Active Problem List  Diagnosis  . PARKINSON'S DISEASE  . ESSENTIAL HYPERTENSION, BENIGN  . GERD  . HEMORRHAGE OF RECTUM AND ANUS  . OSTEOARTHRITIS, KNEE, RIGHT  . ARTHRITIS, LEFT FOOT  . PAIN IN JOINT, HAND  . Pain in joint, pelvic region and thigh  . KNEE PAIN  . SPINAL STENOSIS, LUMBAR  . SCIATICA  . BURSITIS, RIGHT KNEE  . UNSTEADY GAIT  . MEDIAL MENISCUS TEAR, RIGHT  . Other and unspecified hyperlipidemia  . Prediabetes  . Osteoporosis  . Unsteady gait  . Ankle pain, right  . Leg swelling  . Hyperlipidemia   PT - End of Session Activity Tolerance: Patient tolerated treatment well    Greyden Besecker,CINDY 01/06/2012, 12:00 PM

## 2012-01-17 ENCOUNTER — Ambulatory Visit (HOSPITAL_COMMUNITY): Payer: Medicare Other | Admitting: Physical Therapy

## 2012-01-20 ENCOUNTER — Ambulatory Visit (HOSPITAL_COMMUNITY)
Admission: RE | Admit: 2012-01-20 | Discharge: 2012-01-20 | Disposition: A | Discharge status: Home / Self Care | Payer: Medicare Other | Source: Ambulatory Visit | Attending: Family Medicine | Admitting: Family Medicine

## 2012-01-20 DIAGNOSIS — R269 Unspecified abnormalities of gait and mobility: Secondary | ICD-10-CM | POA: Insufficient documentation

## 2012-01-20 DIAGNOSIS — E785 Hyperlipidemia, unspecified: Secondary | ICD-10-CM | POA: Insufficient documentation

## 2012-01-20 DIAGNOSIS — M545 Low back pain, unspecified: Secondary | ICD-10-CM | POA: Insufficient documentation

## 2012-01-20 DIAGNOSIS — I10 Essential (primary) hypertension: Secondary | ICD-10-CM | POA: Insufficient documentation

## 2012-01-20 DIAGNOSIS — IMO0001 Reserved for inherently not codable concepts without codable children: Secondary | ICD-10-CM | POA: Insufficient documentation

## 2012-01-20 NOTE — Progress Notes (Signed)
Physical Therapy Treatment Patient Details  Name: Erin [name] MRN: [id number] Date of Birth: 05-12-1930  Today's Date: 01/20/2012 Time: 1022-1114 PT Time Calculation (min): 52 min  Visit#: 14  of 16   Re-eval: 01/26/12    Authorization:    Authorization Time Period:    Authorization Visit#: 13  of 16    Subjective: Symptoms/Limitations Symptoms: Pt states she has not been to treatment secondary to her brother dying.  States she has not been doing her HEP either but is ready to start doing them again.  Precautions/Restrictions     Exercise/Treatments       Aerobic Stationary Bike: 7' @ 3.0 Machines for Strengthening Cybex Leg Press: 6PL x 15 (Pt states 8Pl causes increased pain in groin area.) Plyometrics   Standing Heel Raises: 10 reps Functional Squat: 15 reps Lunge Walking - Round Trips: 2 1/4 squat Rocker Board:  (side to side and forward and back x 10 each) SLS: R x 30sec/L x 27 Gait Training: tandem gt/retro toe to heel gt x 2 Other Standing Knee Exercises: t-band for posture blue x 10 Other Standing Knee Exercises: walking outside steps w/no hands/ grass and inclines Seated Other Seated Knee Exercises: sit to stand x 10 B Supine Bridges: 10 reps;Limitations Bridges Limitations: on ball Sidelying Hip ABduction: Both;10 reps Clams: x 15 @     Physical Therapy Assessment and Plan PT Assessment and Plan Clinical Impression Statement: Pt LOB one time on steps but able to self correct.  Improved glut strength. Rehab Potential: Good PT Plan: re-evaluate next visit.    Goals    Problem List Patient Active Problem List  Diagnosis  . PARKINSON'S DISEASE  . ESSENTIAL HYPERTENSION, BENIGN  . GERD  . HEMORRHAGE OF RECTUM AND ANUS  . OSTEOARTHRITIS, KNEE, RIGHT  . ARTHRITIS, LEFT FOOT  . PAIN IN JOINT, HAND  . Pain in joint, pelvic region and thigh  . KNEE PAIN  . SPINAL STENOSIS, LUMBAR  . SCIATICA  . BURSITIS, RIGHT KNEE  . UNSTEADY GAIT  .  MEDIAL MENISCUS TEAR, RIGHT  . Other and unspecified hyperlipidemia  . Prediabetes  . Osteoporosis  . Unsteady gait  . Ankle pain, right  . Leg swelling  . Hyperlipidemia    PT - End of Session Equipment Utilized During Treatment: Gait belt Activity Tolerance: Patient tolerated treatment well General Behavior During Session: [facility] for tasks performed Cognition: [facility] for tasks performed PT Plan of Care PT Patient Instructions: Pt encouraged to get back into the habit of doing HEP  GP    RUSSELL,CINDY 01/20/2012, 11:43 AM

## 2012-01-24 ENCOUNTER — Ambulatory Visit (HOSPITAL_COMMUNITY)
Admission: RE | Admit: 2012-01-24 | Discharge: 2012-01-24 | Disposition: A | Discharge status: Home / Self Care | Payer: Medicare Other | Source: Ambulatory Visit | Attending: Physical Therapy | Admitting: Physical Therapy

## 2012-01-24 NOTE — Progress Notes (Signed)
Physical Therapy Treatment Patient Details  Name: Erin [name] MRN: [id number] Date of Birth: 1929-12-05  Today's Date: 01/24/2012 Time: 1017-1107 PT Time Calculation (min): 50 min  Visit#: 15  of 16   Re-eval: 01/24/12    Authorization: Medicare  Authorization Time Period:    Authorization Visit#:   of     Subjective: Symptoms/Limitations Symptoms: I'm not going to lift my leg today my groin really hurts.      Exercise/Treatments      Aerobic Stationary Bike: 7' @3 .0 Machines for Strengthening Cybex Knee Extension: 3 PL x 10 Cybex Knee Flexion: 4 PL x 10 Cybex Leg Press: 6PL x 15   Standing   (wall push up x 10) Gait Training: tandem walk on balance beam x 2 RT;side step BB x 2 RT/ retro on line x 2 RT Other Standing Knee Exercises: t-band for postrue x 10 Other Standing Knee Exercises: walking outside, steps , incline and grace.;  Wall bumps shoulders x 10    Physical Therapy Assessment and Plan PT Assessment and Plan Clinical Impression Statement: Pt had no complaint of groin pain with treatment today.  Pt has progressed well will re-eval next treatment. PT Plan: Pt to be re-evaluated next visit.    Goals    Problem List Patient Active Problem List  Diagnosis  . PARKINSON'S DISEASE  . ESSENTIAL HYPERTENSION, BENIGN  . GERD  . HEMORRHAGE OF RECTUM AND ANUS  . OSTEOARTHRITIS, KNEE, RIGHT  . ARTHRITIS, LEFT FOOT  . PAIN IN JOINT, HAND  . Pain in joint, pelvic region and thigh  . KNEE PAIN  . SPINAL STENOSIS, LUMBAR  . SCIATICA  . BURSITIS, RIGHT KNEE  . UNSTEADY GAIT  . MEDIAL MENISCUS TEAR, RIGHT  . Other and unspecified hyperlipidemia  . Prediabetes  . Osteoporosis  . Unsteady gait  . Ankle pain, right  . Leg swelling  . Hyperlipidemia    PT - End of Session Equipment Utilized During Treatment: Gait belt Activity Tolerance: Patient tolerated treatment well General Behavior During Session: [facility] for tasks performed Cognition: [facility] for tasks  performed PT Plan of Care Consulted and Agree with Plan of Care: Patient  GP    RUSSELL,CINDY 01/24/2012, 12:15 PM

## 2012-01-26 ENCOUNTER — Ambulatory Visit: Payer: Medicare Other | Admitting: Family Medicine

## 2012-02-01 ENCOUNTER — Ambulatory Visit (INDEPENDENT_AMBULATORY_CARE_PROVIDER_SITE_OTHER): Payer: Medicare Other | Admitting: Family Medicine

## 2012-02-01 ENCOUNTER — Encounter: Payer: Self-pay | Admitting: Family Medicine

## 2012-02-01 VITALS — BP 106/68 | HR 83 | Resp 18 | Ht 69.0 in | Wt 172.0 lb

## 2012-02-01 DIAGNOSIS — G20 Parkinson's disease: Secondary | ICD-10-CM

## 2012-02-01 DIAGNOSIS — R7303 Prediabetes: Secondary | ICD-10-CM

## 2012-02-01 DIAGNOSIS — R079 Chest pain, unspecified: Secondary | ICD-10-CM

## 2012-02-01 DIAGNOSIS — Z139 Encounter for screening, unspecified: Secondary | ICD-10-CM

## 2012-02-01 DIAGNOSIS — R0781 Pleurodynia: Secondary | ICD-10-CM

## 2012-02-01 DIAGNOSIS — R269 Unspecified abnormalities of gait and mobility: Secondary | ICD-10-CM

## 2012-02-01 DIAGNOSIS — G20A1 Parkinson's disease without dyskinesia, without mention of fluctuations: Secondary | ICD-10-CM

## 2012-02-01 DIAGNOSIS — N3000 Acute cystitis without hematuria: Secondary | ICD-10-CM

## 2012-02-01 DIAGNOSIS — I10 Essential (primary) hypertension: Secondary | ICD-10-CM

## 2012-02-01 DIAGNOSIS — R7309 Other abnormal glucose: Secondary | ICD-10-CM

## 2012-02-01 DIAGNOSIS — R5381 Other malaise: Secondary | ICD-10-CM

## 2012-02-01 DIAGNOSIS — R5383 Other fatigue: Secondary | ICD-10-CM

## 2012-02-01 DIAGNOSIS — K219 Gastro-esophageal reflux disease without esophagitis: Secondary | ICD-10-CM

## 2012-02-01 DIAGNOSIS — E785 Hyperlipidemia, unspecified: Secondary | ICD-10-CM

## 2012-02-01 DIAGNOSIS — R109 Unspecified abdominal pain: Secondary | ICD-10-CM

## 2012-02-01 LAB — POCT URINALYSIS DIPSTICK
Nitrite, UA: NEGATIVE
Spec Grav, UA: 1.025
Urobilinogen, UA: 0.2
pH, UA: 6.5

## 2012-02-01 MED ORDER — CIPROFLOXACIN HCL 500 MG PO TABS: 500.0000 mg | ORAL_TABLET | Freq: Two times a day (BID) | ORAL | Status: AC

## 2012-02-01 MED ORDER — ESOMEPRAZOLE MAGNESIUM 40 MG PO CPDR: 40.0000 mg | DELAYED_RELEASE_CAPSULE | Freq: Every day | ORAL | Status: DC

## 2012-02-01 NOTE — Patient Instructions (Addendum)
Annual wellness at next visit, in September.Please keep appointment   CBC, TSH, H pylori and HBa1C and chem 7 today   You have nexium sent in for reflux and are referred to Dr Darrick Penna for your reflux symptoms  You need a CXR and an ultrasound of your abdomen to evaluate pain    You appear to have a urinary tract infection , ciprofloxacin is sent in.  Labs today please

## 2012-02-02 LAB — BASIC METABOLIC PANEL
CO2: 30 mEq/L (ref 19–32)
Calcium: 9.4 mg/dL (ref 8.4–10.5)
Creat: 0.95 mg/dL (ref 0.50–1.10)
Glucose, Bld: 89 mg/dL (ref 70–99)

## 2012-02-02 LAB — CBC
MCH: 31.9 pg (ref 26.0–34.0)
MCHC: 33.9 g/dL (ref 30.0–36.0)
MCV: 94.3 fL (ref 78.0–100.0)
Platelets: 240 10*3/uL (ref 150–400)
RDW: 14.1 % (ref 11.5–15.5)

## 2012-02-03 ENCOUNTER — Ambulatory Visit (HOSPITAL_COMMUNITY)
Admission: RE | Admit: 2012-02-03 | Discharge: 2012-02-03 | Disposition: A | Discharge status: Home / Self Care | Payer: Medicare Other | Source: Ambulatory Visit | Attending: Family Medicine | Admitting: Family Medicine

## 2012-02-03 ENCOUNTER — Telehealth: Payer: Self-pay | Admitting: Family Medicine

## 2012-02-03 DIAGNOSIS — R109 Unspecified abdominal pain: Secondary | ICD-10-CM | POA: Insufficient documentation

## 2012-02-03 DIAGNOSIS — R0781 Pleurodynia: Secondary | ICD-10-CM

## 2012-02-03 NOTE — Telephone Encounter (Signed)
Returned pt call  

## 2012-02-04 NOTE — Telephone Encounter (Signed)
I spoke with patient and she is aware.

## 2012-02-05 LAB — URINE CULTURE: Colony Count: >=100000

## 2012-02-08 ENCOUNTER — Encounter: Payer: Self-pay | Admitting: Internal Medicine

## 2012-02-08 ENCOUNTER — Ambulatory Visit (HOSPITAL_COMMUNITY)
Admission: RE | Admit: 2012-02-08 | Discharge: 2012-02-08 | Disposition: A | Discharge status: Home / Self Care | Payer: Medicare Other | Source: Ambulatory Visit | Attending: Family Medicine | Admitting: Family Medicine

## 2012-02-08 NOTE — Evaluation (Deleted)
Physical Therapy Re-evaluation  Patient Details  Name: Erin [name] MRN: [id number] Date of Birth: 12-30-1929  Today's Date: 02/08/2012 Time: 1115-1145 PT Time Calculation (min): 30 min  Visit#: 16  of 16   Re-eval:   Assessment Diagnosis: history of personal falls Next MD Visit: 02/26/2012 Prior Therapy: none  Authorization: Medicare  Past Medical History:  Past Medical History  Diagnosis Date  . Anxiety   . Arthritis   . Hyperlipidemia   . Hypertension   . Osteoporosis   . GERD (gastroesophageal reflux disease)    Past Surgical History:  Past Surgical History  Procedure Date  . Appendectomy   . Cholecystectomy   . Abdominal hysterectomy   . Fracture surgery     X 4 LEFT ARM  . Benign cyst removed from kidney and lung, bilateral mastectomy   . ,bilateral great toenail removal   . Knee arthroscopy 2012    right knee, Dr Romeo Apple  . Left forearm   . Breast surgery     bilateral 2000approx  . Tonsillectomy   . R hand surgery   . Bronchoscopy 02/15/2000  . Right vats. "  . Right upper lobe wedge resection. "  . Upper gastrointestinal endoscopy 11/25/1999    Esophagitis/ Normal proximal esophagus, stomach and duodenum    Subjective Symptoms/Limitations Symptoms: Pt states that her groin is feeling better; Pt vebalizes concern over Right calf swelling How long can you stand comfortably?: The patient states that she is able to stand over 30 minutes to cook; was able to stand for ten minutes. How long can you walk comfortably?: The patient is able to walk 40 minutes now was only able to walk for ten. Pain Assessment Currently in Pain?: Yes Pain Score: 0-No pain (worst has been in the back at a 6/10)  Prior Functioning  Prior Function Level of Independence: Independent with basic ADLs  Cognition/Observation Cognition Overall Cognitive Status: Appears within functional limits for tasks assessed  Sensation/Coordination/Flexibility/Functional  Tests Functional Tests Functional Tests: ABC -69.5%  Assessment RLE Strength Right Hip Flexion: 4/5 (was 4/5) Right Hip Extension: 3+/5 (was 3/5) Right Hip ABduction: 5/5 (was 3+/5) Right Hip ADduction: 3/5 Right Knee Flexion: 4/5 Right Knee Extension: 4/5 (was 3+) Right Ankle Dorsiflexion: 5/5 LLE Strength Left Hip Flexion: 4/5 (was 3+) Left Hip Extension: 3+/5 (was 3/5) Left Hip ABduction: 4/5 (was 3/5) Left Hip ADduction: 3/5 Left Knee Flexion:  (4+/5 was 4/5) Left Knee Extension: 5/5 (was 3/5) Left Ankle Dorsiflexion: 5/5  Exercise/Treatments Mobility/Balance  Posture/Postural Control Posture/Postural Control: Postural limitations Postural Limitations: rounded shoulder, increased kyphosis, decreased lordosis Berg Balance Test Sit to Stand: Able to stand without using hands and stabilize independently Standing Unsupported: Able to stand safely 2 minutes Sitting with Back Unsupported but Feet Supported on Floor or Stool: Able to sit safely and securely 2 minutes Stand to Sit: Sits safely with minimal use of hands Transfers: Able to transfer safely, definite need of hands Standing Unsupported with Eyes Closed: Able to stand 10 seconds safely Standing Ubsupported with Feet Together: Able to place feet together independently and stand 1 minute safely From Standing, Reach Forward with Outstretched Arm: Can reach confidently >25 cm (10") From Standing Position, Pick up Object from Floor: Able to pick up shoe safely and easily From Standing Position, Turn to Look Behind Over each Shoulder: Looks behind from both sides and weight shifts well Turn 360 Degrees: Able to turn 360 degrees safely in 4 seconds or less Standing Unsupported, Alternately Place Feet on  Step/Stool: Able to stand independently and safely and complete 8 steps in 20 seconds Standing Unsupported, One Foot in Front: Able to place foot tandem independently and hold 30 seconds Standing on One Leg: Able to lift leg  independently and hold 5-10 seconds Total Score: 54      Physical Therapy Assessment and Plan PT Assessment and Plan Clinical Impression Statement: Pt ready to discharge  Rehab Potential: Good    Goals Home Exercise Program PT Goal: Perform Home Exercise Program - Progress: Met PT Short Term Goals PT Short Term Goal 1 - Progress: Met PT Short Term Goal 2 - Progress: Met PT Short Term Goal 3 - Progress: Met PT Long Term Goals PT Long Term Goal 1 - Progress: Met PT Long Term Goal 2 - Progress: Met Long Term Goal 3 Progress: Met Long Term Goal 4 Progress: Met Long Term Goal 5 Progress: Met PT Long Term Goal 6: ABC improved to 69.5  Problem List Patient Active Problem List  Diagnosis  . PARKINSON'S DISEASE  . ESSENTIAL HYPERTENSION, BENIGN  . GERD  . HEMORRHAGE OF RECTUM AND ANUS  . OSTEOARTHRITIS, KNEE, RIGHT  . ARTHRITIS, LEFT FOOT  . PAIN IN JOINT, HAND  . Pain in joint, pelvic region and thigh  . KNEE PAIN  . SPINAL STENOSIS, LUMBAR  . SCIATICA  . BURSITIS, RIGHT KNEE  . UNSTEADY GAIT  . MEDIAL MENISCUS TEAR, RIGHT  . Other and unspecified hyperlipidemia  . Prediabetes  . Osteoporosis  . Unsteady gait  . Ankle pain, right  . Leg swelling  . Hyperlipidemia  . Abdominal pain  . Cystitis, acute  . Rib pain on right side    PT - End of Session Activity Tolerance: Patient tolerated treatment well General Behavior During Session: [facility] for tasks performed PT Plan of Care PT Home Exercise Plan: Pt given updated exercise program with balance.  GP Functional Assessment Tool Used: MM Strength, Berg/ ABC Functional Limitation: Mobility: Walking and moving around Mobility: Walking and Moving Around Goal Status [phone]): At least 1 percent but less than 20 percent impaired, limited or restricted Mobility: Walking and Moving Around Discharge Status [phone]): At least 1 percent but less than 20 percent impaired, limited or restricted  RUSSELL,CINDY 02/08/2012, 1:47  PM  Physician Documentation Your signature is required to indicate approval of the treatment plan as stated above.  Please sign and either send electronically or make a copy of this report for your files and return this physician signed original.   Please mark one 1.__approve of plan  2. ___approve of plan with the following conditions.   ______________________________                                                          _____________________ Physician Signature  Date  

## 2012-02-08 NOTE — Evaluation (Signed)
Physical Therapy Re-evaluation  Patient Details  Name: Erin [name] MRN: [id number] Date of Birth: 01-17-1930  Today's Date: 02/08/2012 Time: 1115-1145 PT Time Calculation (min): 30 min  Visit#: 16  of 16   Assessment Diagnosis: history of personal falls Next MD Visit: 02/26/2012 Prior Therapy: none  Authorization: Medicare   Past Medical History:  Past Medical History  Diagnosis Date  . Anxiety   . Arthritis   . Hyperlipidemia   . Hypertension   . Osteoporosis   . GERD (gastroesophageal reflux disease)    Past Surgical History:  Past Surgical History  Procedure Date  . Appendectomy   . Cholecystectomy   . Abdominal hysterectomy   . Fracture surgery     X 4 LEFT ARM  . Benign cyst removed from kidney and lung, bilateral mastectomy   . ,bilateral great toenail removal   . Knee arthroscopy 2012    right knee, Dr Romeo Apple  . Left forearm   . Breast surgery     bilateral 2000approx  . Tonsillectomy   . R hand surgery   . Bronchoscopy 02/15/2000  . Right vats. "  . Right upper lobe wedge resection. "  . Upper gastrointestinal endoscopy 11/25/1999    Esophagitis/ Normal proximal esophagus, stomach and duodenum    Subjective Symptoms/Limitations Symptoms: Pt states that her groin is feeling better; Pt vebalizes concern over Right calf swelling How long can you stand comfortably?: The patient states that she is able to stand over 30 minutes to cook; was able to stand for ten minutes. How long can you walk comfortably?: The patient is able to walk 40 minutes now was only able to walk for ten. Pain Assessment Currently in Pain?: Yes Pain Score: 0-No pain (worst has been in the back at a 6/10)   Prior Functioning  Prior Function Level of Independence: Independent with basic ADLs  Cognition/Observation Cognition Overall Cognitive Status: Appears within functional limits for tasks assessed  Sensation/Coordination/Flexibility/Functional Tests Functional  Tests Functional Tests: ABC -69.5%  Assessment RLE Strength Right Hip Flexion: 4/5 (was 4/5) Right Hip Extension: 3+/5 (was 3/5) Right Hip ABduction: 5/5 (was 3+/5) Right Hip ADduction: 3/5 Right Knee Flexion: 4/5 Right Knee Extension: 4/5 (was 3+) Right Ankle Dorsiflexion: 5/5 LLE Strength Left Hip Flexion: 4/5 (was 3+) Left Hip Extension: 3+/5 (was 3/5) Left Hip ABduction: 4/5 (was 3/5) Left Hip ADduction: 3/5 Left Knee Flexion:  (4+/5 was 4/5) Left Knee Extension: 5/5 (was 3/5) Left Ankle Dorsiflexion: 5/5  Exercise/Treatments Mobility/Balance  Posture/Postural Control Posture/Postural Control: Postural limitations Postural Limitations: rounded shoulder, increased kyphosis, decreased lordosis Berg Balance Test Sit to Stand: Able to stand without using hands and stabilize independently Standing Unsupported: Able to stand safely 2 minutes Sitting with Back Unsupported but Feet Supported on Floor or Stool: Able to sit safely and securely 2 minutes Stand to Sit: Sits safely with minimal use of hands Transfers: Able to transfer safely, definite need of hands Standing Unsupported with Eyes Closed: Able to stand 10 seconds safely Standing Ubsupported with Feet Together: Able to place feet together independently and stand 1 minute safely From Standing, Reach Forward with Outstretched Arm: Can reach confidently >25 cm (10") From Standing Position, Pick up Object from Floor: Able to pick up shoe safely and easily From Standing Position, Turn to Look Behind Over each Shoulder: Looks behind from both sides and weight shifts well Turn 360 Degrees: Able to turn 360 degrees safely in 4 seconds or less Standing Unsupported, Alternately Place Feet on Step/Stool:  Able to stand independently and safely and complete 8 steps in 20 seconds Standing Unsupported, One Foot in Front: Able to place foot tandem independently and hold 30 seconds Standing on One Leg: Able to lift leg independently and  hold 5-10 seconds Total Score: 54      Physical Therapy Assessment and Plan PT Assessment and Plan Clinical Impression Statement: Pt ready to discharge  Rehab Potential: Good    Goals Home Exercise Program PT Goal: Perform Home Exercise Program - Progress: Met PT Short Term Goals PT Short Term Goal 1 - Progress: Met PT Short Term Goal 2 - Progress: Met PT Short Term Goal 3 - Progress: Met PT Long Term Goals PT Long Term Goal 1 - Progress: Met PT Long Term Goal 2 - Progress: Met Long Term Goal 3 Progress: Met Long Term Goal 4 Progress: Met Long Term Goal 5 Progress: Met PT Long Term Goal 6: ABC improved to 69.5  Problem List Patient Active Problem List  Diagnosis  . PARKINSON'S DISEASE  . ESSENTIAL HYPERTENSION, BENIGN  . GERD  . HEMORRHAGE OF RECTUM AND ANUS  . OSTEOARTHRITIS, KNEE, RIGHT  . ARTHRITIS, LEFT FOOT  . PAIN IN JOINT, HAND  . Pain in joint, pelvic region and thigh  . KNEE PAIN  . SPINAL STENOSIS, LUMBAR  . SCIATICA  . BURSITIS, RIGHT KNEE  . UNSTEADY GAIT  . MEDIAL MENISCUS TEAR, RIGHT  . Other and unspecified hyperlipidemia  . Prediabetes  . Osteoporosis  . Unsteady gait  . Ankle pain, right  . Leg swelling  . Hyperlipidemia  . Abdominal pain  . Cystitis, acute  . Rib pain on right side    PT - End of Session Activity Tolerance: Patient tolerated treatment well General Behavior During Session: [facility] for tasks performed PT Plan of Care PT Home Exercise Plan: Pt given updated exercise program with balance.  GP Functional Assessment Tool Used: MM Strength, Berg/ ABC Functional Limitation: Mobility: Walking and moving around Mobility: Walking and Moving Around Goal Status [phone]): At least 1 percent but less than 20 percent impaired, limited or restricted Mobility: Walking and Moving Around Discharge Status [phone]): At least 1 percent but less than 20 percent impaired, limited or restricted  Lataria Courser,CINDY 02/08/2012, 1:45 PM  Physician  Documentation Your signature is required to indicate approval of the treatment plan as stated above.  Please sign and either send electronically or make a copy of this report for your files and return this physician signed original.   Please mark one 1.__approve of plan  2. ___approve of plan with the following conditions.   ______________________________                                                          _____________________ Physician Signature  Date  

## 2012-02-09 ENCOUNTER — Ambulatory Visit: Payer: Medicare Other | Admitting: Gastroenterology

## 2012-02-11 ENCOUNTER — Encounter: Payer: Self-pay | Admitting: Gastroenterology

## 2012-02-14 ENCOUNTER — Other Ambulatory Visit: Payer: Self-pay | Admitting: Gastroenterology

## 2012-02-14 ENCOUNTER — Encounter: Payer: Self-pay | Admitting: Gastroenterology

## 2012-02-14 ENCOUNTER — Ambulatory Visit (INDEPENDENT_AMBULATORY_CARE_PROVIDER_SITE_OTHER): Payer: Medicare Other | Admitting: Gastroenterology

## 2012-02-14 VITALS — BP 113/69 | HR 83 | Temp 97.6°F | Ht 67.0 in | Wt 171.8 lb

## 2012-02-14 DIAGNOSIS — R1310 Dysphagia, unspecified: Secondary | ICD-10-CM

## 2012-02-14 DIAGNOSIS — K219 Gastro-esophageal reflux disease without esophagitis: Secondary | ICD-10-CM

## 2012-02-14 DIAGNOSIS — Z800 Family history of malignant neoplasm of digestive organs: Secondary | ICD-10-CM

## 2012-02-14 DIAGNOSIS — R1031 Right lower quadrant pain: Secondary | ICD-10-CM

## 2012-02-14 NOTE — Patient Instructions (Addendum)
Continue to take Nexium daily.  We have set you up for a CT scan of your belly.  We have also set you up for an upper endoscopy to look at your stomach and esophagus. Further recommendations once this is completed.

## 2012-02-14 NOTE — Progress Notes (Signed)
Primary Care Physician:  Syliva Overman, MD Primary Gastroenterologist:  Dr. Darrick Penna   Chief Complaint  Patient presents with  . Abdominal Pain    HPI:   Erin [name] is a pleasant 78 year old female who presents at the request of Dr. Lodema Hong secondary to abdominal pain. Notes RUQ pain radiating to RLQ, like "having a baby" at times for about one week. Now located primarily in RLQ. No aggravating factors. No fever, chills, hematochezia. Last TCS in 2010 by Dr. Arlyce Dice. Some discrepancy regarding FH of colon cancer, records state due in 2015. Denies wt loss, lack of appetite. Treated with Cipro for UTI recently. Notes BM once per day. Had issue with constipation and used suppositories prn; took a dietary supplement in mail called "Dr. Sherrie Sport Ultimate colon Care Formula". States uncomfortable, large amount of stool.   Notes chronic GERD. Last 3-4 months feels like chocolate, strawberries feels like it is stopping mid-esophagus. If bends over to put shoes on, starts foaming. Became concerned. Nexium daily.   CBC normal, negative H.pylori serology July 2013.    01/2012 US*RADIOLOGY REPORT*  Clinical Data: Abdominal pain  ABDOMINAL ULTRASOUND COMPLETE  Comparison: 03/02/2011  Findings:  Gallbladder: Prior cholecystectomy.  Common Bile Duct: Within normal limits in caliber.  Liver: Appears slightly echogenic suggesting fatty infiltration.  IVC: Appears normal.  Pancreas: No abnormality identified.  Spleen: Within normal limits in size and echotexture.  Right kidney: Normal in size and parenchymal echogenicity. No  evidence of mass or hydronephrosis.  Left kidney: Normal in size and parenchymal echogenicity measuring  10.8 cm. Cyst involving the mid/inferior pole of the left kidney  measures 4.7 x 3.8 x 3.2 cm. Described previously described as  representing a simple cyst.  Abdominal Aorta: No aneurysm identified.  IMPRESSION:  1. No acute findings.  2. Renal cyst   Past Medical  History  Diagnosis Date  . Anxiety   . Arthritis   . Hyperlipidemia   . Hypertension   . Osteoporosis   . GERD (gastroesophageal reflux disease)     Past Surgical History  Procedure Date  . Appendectomy   . Cholecystectomy   . Abdominal hysterectomy   . Fracture surgery     X 4 LEFT ARM  . Benign cyst removed from kidney and lung, bilateral mastectomy   . ,bilateral great toenail removal   . Knee arthroscopy 2012    right knee, Dr Romeo Apple  . Left forearm   . Breast surgery     bilateral 2000approx  . Tonsillectomy   . R hand surgery   . Bronchoscopy 02/15/2000  . Right vats. "  . Right upper lobe wedge resection. "  . Upper gastrointestinal endoscopy 11/25/1999    Esophagitis/ Normal proximal esophagus, stomach and duodenum  . Colonoscopy 01/17/2009    Dr. Arlyce Dice : Internal hemorrhoids/Diverticula, scattered in the ascending colon/  Moderate diverticulosis ascending colon to sigmoid colon  . Esophagogastroduodenoscopy  04/23/2003    Dr. Arlyce Dice: HIATAL HERNIA  . Colonoscopy 01/16/2001    Normal    Current Outpatient Prescriptions  Medication Sig Dispense Refill  . amLODipine (NORVASC) 5 MG tablet TAKE 1 TABLET (5 MG TOTAL) BY MOUTH DAILY.  90 tablet  0  . aspirin 81 MG tablet Take 81 mg by mouth daily.        . Calcium Carbonate-Vit D-Min 1200-1000 MG-UNIT CHEW Chew 1 capsule by mouth 2 (two) times daily.        [name] CRANBERRY PO Take by mouth.      [name]  esomeprazole (NEXIUM) 40 MG capsule Take 1 capsule (40 mg total) by mouth daily before breakfast.  30 capsule  5  . ezetimibe-simvastatin (VYTORIN) 10-40 MG per tablet Take 1 tablet by mouth at bedtime.      Jodelle Green Aspart-Potassium Aspart 90-90 MG CAPS Take 1 capsule by mouth at bedtime.        . metoprolol succinate (TOPROL-XL) 50 MG 24 hr tablet TAKE 1 TABLET EVERY DAY  90 tablet  0  . Potassium 99 MG TABS Take 99 mg by mouth daily.       . pravastatin (PRAVACHOL) 40 MG tablet Take 1 tablet (40 mg total) by mouth daily.   90 tablet  1  . UNABLE TO FIND Ultimate Colon Care Formaula      . vitamin B-12 (CYANOCOBALAMIN) 1000 MCG tablet Take 1,000 mcg by mouth daily.        . vitamin E 400 UNIT capsule Take 400 Units by mouth daily.          Allergies as of 02/14/2012 - Review Complete 02/14/2012  Allergen Reaction Noted  . Codeine Hives   . Morphine and related Itching and Other (See Comments) 12/24/2010    Family History  Problem Relation Age of Onset  . Diabetes Mother   . Heart disease Mother   . Cancer Sister     KIDNEY  . Heart disease Brother   . Heart disease Sister   . Cancer Sister     BREAST  . Heart disease Brother   . Colon cancer Neg Hx     History   Social History  . Marital Status: Widowed    Spouse Name: N/A    Number of Children: N/A  . Years of Education: N/A   Occupational History  . Not on file.   Social History Main Topics  . Smoking status: Never Smoker   . Smokeless tobacco: Not on file  . Alcohol Use: No  . Drug Use: No  . Sexually Active: Not on file   Other Topics Concern  . Not on file   Social History Narrative  . No narrative on file    Review of Systems: Gen: Denies any fever, chills, fatigue, weight loss, lack of appetite.  CV: Denies chest pain, heart palpitations, peripheral edema, syncope.  Resp: Denies shortness of breath at rest or with exertion. Denies wheezing or cough.  GI: SEE HPI GU : Denies urinary burning, urinary frequency, urinary hesitancy MS: Denies joint pain, muscle weakness, cramps, or limitation of movement.  Derm: Denies rash, itching, dry skin Psych: Denies depression, anxiety, memory loss, and confusion Heme: Denies bruising, bleeding, and enlarged lymph nodes.  Physical Exam: BP 113/69  Pulse 83  Temp 97.6 F (36.4 C) (Temporal)  Ht 5\' 7"  (1.702 m)  Wt 171 lb 12.8 oz (77.928 kg)  BMI 26.91 kg/m2 General:   Alert and oriented. Pleasant and cooperative. Well-nourished and well-developed.  Head:  Normocephalic and  atraumatic. Eyes:  Without icterus, sclera clear and conjunctiva pink.  Ears:  Normal auditory acuity. Nose:  No deformity, discharge,  or lesions. Mouth:  No deformity or lesions, oral mucosa pink.  Neck:  Supple, without mass or thyromegaly. Lungs:  Clear to auscultation bilaterally. No wheezes, rales, or rhonchi. No distress.  Heart:  S1, S2 present without murmurs appreciated.  Abdomen:  +BS, soft, TTP RLQ and non-distended. No HSM noted. No guarding or rebound. No masses appreciated.  Rectal:  Deferred  Msk:  Symmetrical without gross deformities.  Normal posture. Extremities:  Right lower extremity with 1+ edema, left lower extremity trace edema, states chronic, hx of right knee surgery Neurologic:  Alert and  oriented x4;  grossly normal neurologically. Skin:  Intact without significant lesions or rashes. Cervical Nodes:  No significant cervical adenopathy. Psych:  Alert and cooperative. Normal mood and affect.

## 2012-02-16 ENCOUNTER — Ambulatory Visit (HOSPITAL_COMMUNITY)
Admission: RE | Admit: 2012-02-16 | Discharge: 2012-02-16 | Disposition: A | Discharge status: Home / Self Care | Payer: Medicare Other | Source: Ambulatory Visit | Attending: Gastroenterology | Admitting: Gastroenterology

## 2012-02-16 DIAGNOSIS — K5730 Diverticulosis of large intestine without perforation or abscess without bleeding: Secondary | ICD-10-CM | POA: Insufficient documentation

## 2012-02-16 DIAGNOSIS — R1031 Right lower quadrant pain: Secondary | ICD-10-CM

## 2012-02-16 MED ORDER — IOHEXOL 300 MG/ML  SOLN
100.0000 mL | Freq: Once | INTRAMUSCULAR | Status: AC | PRN
  Administered 2012-02-16: 100 mL via INTRAVENOUS

## 2012-02-17 ENCOUNTER — Telehealth: Payer: Self-pay | Admitting: *Deleted

## 2012-02-17 ENCOUNTER — Telehealth: Payer: Self-pay | Admitting: Gastroenterology

## 2012-02-17 ENCOUNTER — Encounter: Payer: Self-pay | Admitting: Gastroenterology

## 2012-02-17 DIAGNOSIS — R1031 Right lower quadrant pain: Secondary | ICD-10-CM | POA: Insufficient documentation

## 2012-02-17 NOTE — Assessment & Plan Note (Signed)
Chronic, on Nexium. Notes vague dysphagia with chocolate, strawberries. Pt concerned. Last EGD in 2004 with hiatal hernia. May be dealing with uncontrolled GERD, esophageal web, ring, doubt stricture. Doubt malignancy. Likely due to lifestyle, needing behavior modification.   Proceed with upper endoscopy/dilation in the near future with Dr. Darrick Penna. The risks, benefits, and alternatives have been discussed in detail with patient. They have stated understanding and desire to proceed.

## 2012-02-17 NOTE — Assessment & Plan Note (Signed)
78 year old female with about a week long duration of RLQ abdominal pain, originally located in RUQ. See HPI for full details. [place] negative for acute issues, labs thus far unrevealing to include CBC and H.pylori serology. During physical exam, significantly TTP. Unclear etiology at this point. CT abd/pelvis to r/o acute process. As of note, may be dealing with underlying IBS. Notes intermittent constipation. Instructed to avoid herbal supplement she had taken previously.   Further recommendations after CT abd/pelvis Send results to nephrologist, following up on renal cyst Consider Levbid, prior notes appear may have taken in past (TCS reports from 2010) May need more aggressive bowel regimen

## 2012-02-17 NOTE — Telephone Encounter (Signed)
Addressed under results.  CT negative for acute findings.

## 2012-02-17 NOTE — Progress Notes (Signed)
Quick Note:  CT without any acute findings.  Stable renal cyst. Please have report faxed to her nephrologist (may have to ask her who that is). Thanks!  ______

## 2012-02-17 NOTE — Progress Notes (Signed)
Quick Note:  LMOM to call. ______ 

## 2012-02-17 NOTE — Telephone Encounter (Signed)
LMOM to call.

## 2012-02-17 NOTE — Telephone Encounter (Signed)
Routing to Anna Sams, NP. 

## 2012-02-17 NOTE — Progress Notes (Signed)
Faxed to PCP

## 2012-02-17 NOTE — Telephone Encounter (Signed)
Can we verify family hx of colon cancer? I found in past records she may have had a sister with colon cancer. I believe she told me she had no FH of colon cancer. This will help determine timing of next TCS. Last done in 2010.

## 2012-02-17 NOTE — Telephone Encounter (Signed)
Ms Vittitow would like a call back with the results of her recent CT. Thanks.

## 2012-02-18 NOTE — Telephone Encounter (Signed)
Pt said her sister did have colon cancer.

## 2012-02-18 NOTE — Telephone Encounter (Signed)
Informed pt .

## 2012-02-18 NOTE — Progress Notes (Signed)
Quick Note:  Pt was informed. Her nephrologist is Dr. Annabell Howells. Routing to [place] to forward results to him. ______

## 2012-02-18 NOTE — Progress Notes (Signed)
Faxed to Dr Annabell Howells

## 2012-02-19 NOTE — Assessment & Plan Note (Signed)
Uncontrolled , refer to GI 

## 2012-02-19 NOTE — Assessment & Plan Note (Signed)
Controlled, no change in medication  

## 2012-02-19 NOTE — Assessment & Plan Note (Signed)
Hyperlipidemia:Low fat diet discussed and encouraged.  LDL elevated, no med change at this time

## 2012-02-19 NOTE — Assessment & Plan Note (Signed)
Improved HBA1C, pt applauded on this and encouraged to continue low carb diet

## 2012-02-19 NOTE — Assessment & Plan Note (Signed)
Abnormal urinalysis and symptomatic, antibiotics prescribed will f/u on c/s

## 2012-02-19 NOTE — Assessment & Plan Note (Signed)
Localized tender area on right lower rib, will order specific xray to furhter evaluate

## 2012-02-19 NOTE — Progress Notes (Signed)
  Subjective:    Patient ID: Erin [name], female    DOB: Oct 19, 1929, 79 y.o.   MRN: [id number]  HPI The PT is here with c/o increased and uncontrolled, RUQ pain , with local area of tenderness on right lower rib , no trauma to same. Also c/o uncontrolled reflux symptoms with burning . C/o urinary frequency and malodorous urine in the past 3 days , has h/o recurrent UTI, denies fever , chills or flank pain     Review of Systems See HPI Denies recent fever or chills. Denies sinus pressure, nasal congestion, ear pain or sore throat. Denies chest congestion, productive cough or wheezing. Denies chest pains, palpitations and leg swelling  Denies joint pain, swelling and limitation in mobility. Denies headaches, seizures, numbness, or tingling. Denies depression, anxiety or insomnia. Denies skin break down or rash.        Objective:   Physical Exam  Patient alert and oriented and in no cardiopulmonary distress.  HEENT: No facial asymmetry, EOMI, no sinus tenderness,  oropharynx pink and moist.  Neck supple no adenopathy.  Chest: Clear to auscultation bilaterally.Tender to palpation over right lower rib  CVS: S1, S2 no murmurs, no S3.  ABD: Soft RUQ tenderness, no guarding or rebound. Suprapubic tenderness, no renal angle tenderness, BS normal Ext: No edema  MS: Adequate ROM spine, shoulders, hips and knees.  Skin: Intact, no ulcerations or rash noted.  Psych: Good eye contact, normal affect. Memory intact not anxious or depressed appearing.  CNS: CN 2-12 intact, power, tone and sensation normal throughout.       Assessment & Plan:

## 2012-02-19 NOTE — Assessment & Plan Note (Signed)
Increased and uncontrolled pain with nausea, imaging studies ordered, also gI referral

## 2012-02-23 ENCOUNTER — Encounter: Payer: Self-pay | Admitting: Gastroenterology

## 2012-02-23 DIAGNOSIS — Z800 Family history of malignant neoplasm of digestive organs: Secondary | ICD-10-CM | POA: Insufficient documentation

## 2012-02-23 NOTE — Telephone Encounter (Signed)
Thank you.  I've added this to her hx.  Next TCS 2015.  Can we see how RLQ pain is?

## 2012-02-23 NOTE — Assessment & Plan Note (Signed)
Due for colonoscopy 2015.

## 2012-02-28 NOTE — Telephone Encounter (Signed)
Called pt. She said she is feeling better. She has the pain some occasionally. She stays constipated. She has a BM daily but she has to use a fleet suppository daily in order to have one. Please advise!

## 2012-03-01 MED ORDER — LUBIPROSTONE 8 MCG PO CAPS: 8.0000 ug | ORAL_CAPSULE | Freq: Two times a day (BID) | ORAL | Status: DC

## 2012-03-01 NOTE — Telephone Encounter (Signed)
Called and informed pt. She will call back if she needs samples.

## 2012-03-01 NOTE — Telephone Encounter (Signed)
I believe her abd pain is caused by constipation. CT on file negative, as she already knows. I have sent prescription for Amitiza 8 mcg po BID daily. May provide samples. Return for f/u after EGD with [facility].

## 2012-03-03 ENCOUNTER — Encounter (HOSPITAL_COMMUNITY): Payer: Self-pay | Admitting: Pharmacy Technician

## 2012-03-20 ENCOUNTER — Encounter (HOSPITAL_COMMUNITY): Payer: Self-pay | Admitting: *Deleted

## 2012-03-20 ENCOUNTER — Ambulatory Visit (HOSPITAL_COMMUNITY): Payer: Medicare Other

## 2012-03-20 ENCOUNTER — Ambulatory Visit (HOSPITAL_COMMUNITY)
Admission: RE | Admit: 2012-03-20 | Discharge: 2012-03-20 | Disposition: A | Discharge status: Home / Self Care | Payer: Medicare Other | Source: Ambulatory Visit | Attending: Gastroenterology | Admitting: Gastroenterology

## 2012-03-20 ENCOUNTER — Encounter (HOSPITAL_COMMUNITY)
Admission: RE | Disposition: A | Discharge status: Home / Self Care | Payer: Self-pay | Source: Ambulatory Visit | Attending: Gastroenterology

## 2012-03-20 DIAGNOSIS — R1031 Right lower quadrant pain: Secondary | ICD-10-CM

## 2012-03-20 DIAGNOSIS — K2990 Gastroduodenitis, unspecified, without bleeding: Secondary | ICD-10-CM

## 2012-03-20 DIAGNOSIS — I10 Essential (primary) hypertension: Secondary | ICD-10-CM | POA: Insufficient documentation

## 2012-03-20 DIAGNOSIS — K449 Diaphragmatic hernia without obstruction or gangrene: Secondary | ICD-10-CM | POA: Insufficient documentation

## 2012-03-20 DIAGNOSIS — K222 Esophageal obstruction: Secondary | ICD-10-CM

## 2012-03-20 DIAGNOSIS — R109 Unspecified abdominal pain: Secondary | ICD-10-CM | POA: Insufficient documentation

## 2012-03-20 DIAGNOSIS — R1310 Dysphagia, unspecified: Secondary | ICD-10-CM

## 2012-03-20 DIAGNOSIS — K2970 Gastritis, unspecified, without bleeding: Secondary | ICD-10-CM

## 2012-03-20 DIAGNOSIS — D131 Benign neoplasm of stomach: Secondary | ICD-10-CM | POA: Insufficient documentation

## 2012-03-20 DIAGNOSIS — E785 Hyperlipidemia, unspecified: Secondary | ICD-10-CM | POA: Insufficient documentation

## 2012-03-20 HISTORY — PX: ESOPHAGOGASTRODUODENOSCOPY: SHX1529

## 2012-03-20 SURGERY — ESOPHAGOGASTRODUODENOSCOPY (EGD) WITH ESOPHAGEAL DILATION
Anesthesia: Moderate Sedation

## 2012-03-20 MED ORDER — SIMETHICONE 40 MG/0.6ML PO SUSP
ORAL | Status: DC | PRN
  Administered 2012-03-20: 09:00:00

## 2012-03-20 MED ORDER — PROMETHAZINE HCL 25 MG/ML IJ SOLN
INTRAMUSCULAR | Status: AC
  Filled 2012-03-20: qty 1

## 2012-03-20 MED ORDER — MIDAZOLAM HCL 5 MG/5ML IJ SOLN
INTRAMUSCULAR | Status: AC
  Filled 2012-03-20: qty 10

## 2012-03-20 MED ORDER — MIDAZOLAM HCL 5 MG/5ML IJ SOLN
INTRAMUSCULAR | Status: DC | PRN
  Administered 2012-03-20: 2 mg via INTRAVENOUS

## 2012-03-20 MED ORDER — MINERAL OIL PO OIL
TOPICAL_OIL | ORAL | Status: AC
  Filled 2012-03-20: qty 30

## 2012-03-20 MED ORDER — MEPERIDINE HCL 100 MG/ML IJ SOLN
INTRAMUSCULAR | Status: DC | PRN
  Administered 2012-03-20: 25 mg

## 2012-03-20 MED ORDER — POLYETHYLENE GLYCOL 3350 17 GM/SCOOP PO POWD: ORAL | Status: AC

## 2012-03-20 MED ORDER — SODIUM CHLORIDE 0.45 % IV SOLN
Freq: Once | INTRAVENOUS | Status: AC
  Administered 2012-03-20: 1000 mL via INTRAVENOUS

## 2012-03-20 MED ORDER — BUTAMBEN-TETRACAINE-BENZOCAINE 2-2-14 % EX AERO
INHALATION_SPRAY | CUTANEOUS | Status: DC | PRN
  Administered 2012-03-20: 2 via TOPICAL

## 2012-03-20 MED ORDER — MEPERIDINE HCL 100 MG/ML IJ SOLN
INTRAMUSCULAR | Status: AC
  Filled 2012-03-20: qty 1

## 2012-03-20 NOTE — Op Note (Addendum)
[facility] [address] [place], [address]   ENDOSCOPY PROCEDURE REPORT  PATIENT: Erin [name], Erin [name]  MR#: [id number] BIRTHDATE: [birth year] , 82  yrs. old GENDER: Female  ENDOSCOPIST: Jonette Eva, MD REFFERED QQ:[id number] Lodema Hong, M.D.  PROCEDURE DATE:  03/20/2012 PROCEDURE:   EGD with biopsy and EGD with dilatation over guidewire   INDICATIONS:1.  abdominal pain and dysphagia. MEDICATIONS: Demerol 25 mg IV and Versed 2 mg IV TOPICAL ANESTHETIC: Cetacaine Spray  DESCRIPTION OF PROCEDURE:   After the risks benefits and alternatives of the procedure were thoroughly explained, informed consent was obtained.  The EG-[id number] ([id number])  endoscope was introduced through the mouth and advanced to the second portion of the duodenum.  The instrument was slowly withdrawn as the mucosa was carefully examined.  Prior to withdrawal of the scope, the guidwire was placed.  The esophagus was dilated successfully.  The patient was recovered in endoscopy and discharged home in satisfactory condition.      ESOPHAGUS: A Schatzki ring was found.   A 2 cm hiatal hernia was noted.  STOMACH: Non-erosive gastritis (inflammation) was found.   Multiple sessile polyps ranging between 3-54mm in size were found in the gastric body.   BIOPSIES OBTAINED VIA COLD FORCEPS. Dilation was then performed at the gastroesphageal junction  Dilator: Savary over guidewire Size(s): 14-16 mm (MILD RESISTANCE)  COMPLICATIONS: There were no complications.   ENDOSCOPIC IMPRESSION: 1.   Schatzki ring-LIKELY CAUSING MILD DYSPHAGIA 2.   SMALL hiatal hernia 3.   Non-erosive gastritis (inflammation) 4.   Multiple sessile polyps ranging between 3-3mm in size were found in the gastric body 5.   ABDOMINAL PAIN MOST LIKELY DUE TO NSAID GASTROPATHY/ENTEROPATHY AND/OR CONSTIPATION  RECOMMENDATIONS: 1.  await biopsy results 2.  NEXIUM DAILY NO ASA OR NSAIDS FOR 1 MONTH LOW FAT DIET MIRALAX BID OPV IN  2 MOS. IF CONTINUES WITH DYSPHAGIA, PT WILL NEED BARIUM PILL ESOPHAGRAM.      _______________________________ Rosalie DoctorJonette Eva, MD 03/20/2012 9:22 AM Revised: 03/20/2012 9:22 AM     PATIENT NAME:  Erin [name], Erin [name] MR#: [id number]

## 2012-03-20 NOTE — H&P (Signed)
Primary Care Physician:  Syliva Overman, MD Primary Gastroenterologist:  Dr. Darrick Penna  Pre-Procedure History & Physical: HPI:  Erin [name] is a 78 y.o. female here for ABDOMINAL PAIN.  Past Medical History  Diagnosis Date  . Anxiety   . Arthritis   . Hyperlipidemia   . Hypertension   . Osteoporosis   . GERD (gastroesophageal reflux disease)     Past Surgical History  Procedure Date  . Appendectomy   . Cholecystectomy   . Abdominal hysterectomy   . Fracture surgery     X 4 LEFT ARM  . Benign cyst removed from kidney and lung, bilateral mastectomy   . ,bilateral great toenail removal   . Knee arthroscopy 2012    right knee, Dr Romeo Apple  . Left forearm   . Breast surgery     bilateral 2000approx  . Tonsillectomy   . R hand surgery   . Bronchoscopy 02/15/2000  . Right vats. "  . Right upper lobe wedge resection. "  . Upper gastrointestinal endoscopy 11/25/1999    Esophagitis/ Normal proximal esophagus, stomach and duodenum  . Colonoscopy 01/17/2009    Dr. Arlyce Dice : Internal hemorrhoids/Diverticula, scattered in the ascending colon/  Moderate diverticulosis ascending colon to sigmoid colon  . Esophagogastroduodenoscopy  04/23/2003    Dr. Arlyce Dice: HIATAL HERNIA  . Colonoscopy 01/16/2001    Normal    Prior to Admission medications   Medication Sig Start Date End Date Taking? Authorizing Provider  amLODipine (NORVASC) 5 MG tablet Take 5 mg by mouth daily.   Yes Historical Provider, MD  Calcium Carbonate-Vit D-Min 1200-1000 MG-UNIT CHEW Chew 1 capsule by mouth 2 (two) times daily.     Yes Historical Provider, MD  CALCIUM-MAGNESIUM-ZINC PO Take 1 tablet by mouth daily.   Yes Historical Provider, MD  Cholecalciferol (VITAMIN D3) 5000 UNITS CAPS Take 1 capsule by mouth daily.   Yes Historical Provider, MD  Cranberry-Cholecalciferol 4200-500 MG-UNIT CAPS Take 1 capsule by mouth daily.   Yes Historical Provider, MD  esomeprazole (NEXIUM) 40 MG capsule Take 1 capsule (40 mg  total) by mouth daily before breakfast. 02/01/12  Yes Kerri Perches, MD  ezetimibe-simvastatin (VYTORIN) 10-40 MG per tablet Take 1 tablet by mouth at bedtime.   Yes Historical Provider, MD  ibuprofen (ADVIL,MOTRIN) 200 MG tablet Take 200 mg by mouth every 6 (six) hours as needed. Pain   Yes Historical Provider, MD  losartan-hydrochlorothiazide (HYZAAR) 50-12.5 MG per tablet Take 1 tablet by mouth daily.   Yes Historical Provider, MD  metoprolol succinate (TOPROL-XL) 50 MG 24 hr tablet Take 50 mg by mouth daily. Take with or immediately following a meal.   Yes Historical Provider, MD  Potassium 99 MG TABS Take 99 mg by mouth daily.    Yes Historical Provider, MD  topiramate (TOPAMAX) 25 MG tablet Take 25 mg by mouth 2 (two) times daily.   Yes Historical Provider, MD  vitamin D, CHOLECALCIFEROL, 400 UNITS tablet Take 400 Units by mouth 2 (two) times daily.   Yes Historical Provider, MD    Allergies as of 02/14/2012 - Review Complete 02/14/2012  Allergen Reaction Noted  . Codeine Hives   . Morphine and related Itching and Other (See Comments) 12/24/2010    Family History  Problem Relation Age of Onset  . Diabetes Mother   . Heart disease Mother   . Cancer Sister     KIDNEY  . Heart disease Brother   . Heart disease Sister   . Cancer Sister  BREAST  . Heart disease Brother   . Colon cancer Sister     History   Social History  . Marital Status: Widowed    Spouse Name: N/A    Number of Children: N/A  . Years of Education: N/A   Occupational History  . Not on file.   Social History Main Topics  . Smoking status: Never Smoker   . Smokeless tobacco: Not on file  . Alcohol Use: No  . Drug Use: No  . Sexually Active: Not on file   Other Topics Concern  . Not on file   Social History Narrative  . No narrative on file    Review of Systems: See HPI, otherwise negative ROS   Physical Exam: BP 137/59  Pulse 60  Temp 98 F (36.7 C) (Oral)  Resp 16  Ht 5\' 7"   (1.702 m)  Wt 170 lb (77.111 kg)  BMI 26.63 kg/m2  SpO2 100% General:   Alert,  pleasant and cooperative in NAD Head:  Normocephalic and atraumatic. Neck:  Supple;  Lungs:  Clear throughout to auscultation.    Heart:  Regular rate and rhythm. Abdomen:  Soft, nontender and nondistended. Normal bowel sounds, without guarding, and without rebound.   Neurologic:  Alert and  oriented x4;  grossly normal neurologically.  Impression/Plan:     ABDOMINAL PAIN  PLAN:  EGD TODAY

## 2012-03-21 ENCOUNTER — Other Ambulatory Visit: Payer: Self-pay | Admitting: Family Medicine

## 2012-03-22 LAB — BASIC METABOLIC PANEL
CO2: 30 mEq/L (ref 19–32)
Calcium: 9.5 mg/dL (ref 8.4–10.5)
Chloride: 107 mEq/L (ref 96–112)
Sodium: 144 mEq/L (ref 135–145)

## 2012-03-22 LAB — HEMOGLOBIN A1C: Mean Plasma Glucose: 123 mg/dL — ABNORMAL HIGH (ref <117)

## 2012-03-23 ENCOUNTER — Telehealth: Payer: Self-pay | Admitting: Gastroenterology

## 2012-03-23 ENCOUNTER — Ambulatory Visit (HOSPITAL_COMMUNITY)
Admission: RE | Admit: 2012-03-23 | Discharge: 2012-03-23 | Disposition: A | Discharge status: Home / Self Care | Payer: Medicare Other | Source: Ambulatory Visit | Attending: Urology | Admitting: Urology

## 2012-03-23 DIAGNOSIS — Q619 Cystic kidney disease, unspecified: Secondary | ICD-10-CM | POA: Insufficient documentation

## 2012-03-23 NOTE — Telephone Encounter (Signed)
Pt recalled call and was informed.

## 2012-03-23 NOTE — Telephone Encounter (Signed)
Please call pt. Erin [name] stomach Bx shows gastritis. Erin [name] STOMACH POLYPS ARE BENIGN.   CONTINUE NEXIUM EVERY MORNING.  STOP USING ASPIRIN, IBUPROFEN, AND NAPROXEN UNTIL OCT 10. USE TYLENOL AS NEEDED FOR PAIN.  FOLLOW A LOW FAT/HIGH FIBER DIET.  DRINK WATER TO KEEP URINE LIGHT YELLOW.  START MIRALAX WITH BREAKFAST AND LUNCH.  FOLLOW UP APPT IN 2 MONTHS.

## 2012-03-23 NOTE — Telephone Encounter (Signed)
LMOM to call.

## 2012-03-24 NOTE — Telephone Encounter (Signed)
appt made

## 2012-03-28 ENCOUNTER — Ambulatory Visit (INDEPENDENT_AMBULATORY_CARE_PROVIDER_SITE_OTHER): Payer: Medicare Other | Admitting: Family Medicine

## 2012-03-28 ENCOUNTER — Encounter: Payer: Self-pay | Admitting: Family Medicine

## 2012-03-28 VITALS — BP 152/82 | HR 68 | Resp 18 | Ht 69.0 in | Wt 170.0 lb

## 2012-03-28 DIAGNOSIS — Z23 Encounter for immunization: Secondary | ICD-10-CM

## 2012-03-28 DIAGNOSIS — Z0000 Encounter for general adult medical examination without abnormal findings: Secondary | ICD-10-CM

## 2012-03-28 NOTE — Patient Instructions (Addendum)
F/u in February, please call if you need to see me before   I hope that you continue to do as well as you currently are  No changes in medication at this time

## 2012-03-28 NOTE — Progress Notes (Signed)
Subjective:    Patient ID: Erin [name], female    DOB: 04-02-1930, 78 y.o.   MRN: [id number]  HPI Preventive Screening-Counseling & Management   Patient present here today for a Medicare annual wellness visit.   Current Problems (verified)   Medications Prior to Visit Allergies (verified)   PAST HISTORY  Family History  Social History widow, one son, no alcohol, nicotine or street drug use   Risk Factors  Current exercise habits: no regular commitment to physical activity currently, but encouraged her to make the commitment   Dietary issues discussed:low fat diet with carbohydrate "awareness" and limitation as she is prediabetic, rich in vegetable and fruit fresh or frozen, and low in sodium, she is hypertensive. The importance of adequate water intake also discussed   Cardiac risk factors:   Depression Screen  (Note: if answer to either of the following is "Yes", a more complete depression screening is indicated)   Over the past two weeks, have you felt down, depressed or hopeless? No  Over the past two weeks, have you felt little interest or pleasure in doing things? No  Have you lost interest or pleasure in daily life? No  Do you often feel hopeless? No  Do you cry easily over simple problems? No   Activities of Daily Living  In your present state of health, do you have any difficulty performing the following activities?  Driving?: No Managing money?: No Feeding yourself?:No Getting from bed to chair?:No Climbing a flight of stairs?:No Preparing food and eating?:No Bathing or showering?:No Getting dressed?:No Getting to the toilet?:No Using the toilet?:No Moving around from place to place?: No  Fall Risk Assessment In the past year have you fallen or had a near fall?:yes in Feb, got up quickly. Does have balance problems Are you currently taking any medications that make you dizzy?:No   Hearing Difficulties: No Do you often ask people to speak up or  repeat themselves?:No Do you experience ringing or noises in your ears?:No Do you have difficulty understanding soft or whispered voices?:No  Cognitive Testing  Alert? Yes Normal Appearance?Yes  Oriented to person? Yes Place? Yes  Time? Yes  Displays appropriate judgment?Yes  Can read the correct time from a watch face? yes Are you having problems remembering things?sometimes but of not much significance  Advanced Directives have been discussed with the patient?Yes , pt states she is DNR   List the Names of Other Physician/Practitioners you currently use: Dr Rennis Golden, Dr Wilson Singer, Dr Romeo Apple, Dr. Darrick Penna, Dr Dorris Fetch, Dr Anastasio Champion any recent Medical Services you may have received from other than [facility] providers in the past year (date may be approximate).   Assessment:    Annual Wellness Exam   Plan:    During the course of the visit the patient was educated and counseled about appropriate screening and preventive services including:  A healthy diet is rich in fruit, vegetables and whole grains. Poultry fish, nuts and beans are a healthy choice for protein rather then red meat. A low sodium diet and drinking 64 ounces of water daily is generally recommended. Oils and sweet should be limited. Carbohydrates especially for those who are diabetic or overweight, should be limited to 30-45 gram per meal. It is important to eat on a regular schedule, at least 3 times daily. Snacks should be primarily fruits, vegetables or nuts. It is important that you exercise regularly at least 30 minutes 5 times a week. If you develop chest pain, have severe  difficulty breathing, or feel very tired, stop exercising immediately and seek medical attention  Immunization reviewed and updated. Cancer screening reviewed and updated    Patient Instructions (the written plan) was given to the patient.  Medicare Attestation  I have personally reviewed:  The patient's medical and social history  Their use  of alcohol, tobacco or illicit drugs  Their current medications and supplements  The patient's functional ability including ADLs,fall risks, home safety risks, cognitive, and hearing and visual impairment  Diet and physical activities  Evidence for depression or mood disorders  The patient's weight, height, BMI, and visual acuity have been recorded in the chart. I have made referrals, counseling, and provided education to the patient based on review of the above and I have provided the patient with a written personalized care plan for preventive services.      Review of Systems     Objective:   Physical Exam        Assessment & Plan:

## 2012-03-31 ENCOUNTER — Ambulatory Visit (INDEPENDENT_AMBULATORY_CARE_PROVIDER_SITE_OTHER): Payer: Medicare Other | Admitting: Urology

## 2012-03-31 DIAGNOSIS — N3941 Urge incontinence: Secondary | ICD-10-CM

## 2012-03-31 DIAGNOSIS — Q619 Cystic kidney disease, unspecified: Secondary | ICD-10-CM

## 2012-04-13 NOTE — Progress Notes (Signed)
EGD/DIL SEP 2013 GASTRITIS FG POLYPS- ABD PAIN DUE TO NSAID GASTROPATHY/CONSTIPATION  REVIEWED.

## 2012-04-16 DIAGNOSIS — Z0000 Encounter for general adult medical examination without abnormal findings: Secondary | ICD-10-CM | POA: Insufficient documentation

## 2012-04-16 NOTE — Assessment & Plan Note (Signed)
Annual wellness exam performed and documented. Pt is able to live independently and care for herself. She is able to take care of her finances. She does have issues with arthritis, and Parkinsons disease, but her mobility is currently not significantly challenged.  She has already considered end of life issues and states categorically that she is a DNR, though I have no seen no documentation of such,  Encourage her to have this  And also to discuss her wishes with family members

## 2012-04-26 ENCOUNTER — Other Ambulatory Visit: Payer: Self-pay | Admitting: Family Medicine

## 2012-05-18 ENCOUNTER — Ambulatory Visit: Payer: Medicare Other | Admitting: Gastroenterology

## 2012-05-19 ENCOUNTER — Other Ambulatory Visit: Payer: Self-pay | Admitting: Thoracic Surgery (Cardiothoracic Vascular Surgery)

## 2012-05-19 DIAGNOSIS — R911 Solitary pulmonary nodule: Secondary | ICD-10-CM

## 2012-05-23 ENCOUNTER — Ambulatory Visit
Admission: RE | Admit: 2012-05-23 | Discharge: 2012-05-23 | Disposition: A | Discharge status: Home / Self Care | Payer: Medicare Other | Source: Ambulatory Visit | Attending: Thoracic Surgery (Cardiothoracic Vascular Surgery) | Admitting: Thoracic Surgery (Cardiothoracic Vascular Surgery)

## 2012-05-23 ENCOUNTER — Ambulatory Visit (INDEPENDENT_AMBULATORY_CARE_PROVIDER_SITE_OTHER): Payer: Medicare Other | Admitting: Thoracic Surgery (Cardiothoracic Vascular Surgery)

## 2012-05-23 ENCOUNTER — Encounter: Payer: Self-pay | Admitting: Thoracic Surgery (Cardiothoracic Vascular Surgery)

## 2012-05-23 VITALS — BP 137/73 | HR 73 | Resp 16 | Ht 67.0 in | Wt 170.0 lb

## 2012-05-23 DIAGNOSIS — J984 Other disorders of lung: Secondary | ICD-10-CM

## 2012-05-23 DIAGNOSIS — J8410 Pulmonary fibrosis, unspecified: Secondary | ICD-10-CM

## 2012-05-23 DIAGNOSIS — R911 Solitary pulmonary nodule: Secondary | ICD-10-CM

## 2012-05-23 DIAGNOSIS — R918 Other nonspecific abnormal finding of lung field: Secondary | ICD-10-CM

## 2012-05-23 NOTE — Progress Notes (Signed)
HPI:  Erin [name] returns for her annual followup. She is an 78 year old woman who had a right vats and wedge resection for a nodule in 2001. Turned out to be granulomatous disease. She was followed for a while with CT scans and had multiple other small nodules which remains stable. For the last several years it is been doing up plain chest x-ray once year to make sure that none of these nodules have grown. She is a lifelong nonsmoker.  She says that she's been feeling well. She's not having difficulties with her breathing. She was told she needed a pacemaker by Dr. Rennis Golden, but she does not think she needs that. She recently saw Dr. Lodema Hong and overall is doing well. She does have Parkinson's, but remains relatively independent.  Past Medical History  Diagnosis Date  . Anxiety   . Arthritis   . Hyperlipidemia   . Hypertension   . Osteoporosis   . GERD (gastroesophageal reflux disease)      Current Outpatient Prescriptions  Medication Sig Dispense Refill  . amLODipine (NORVASC) 5 MG tablet Take 5 mg by mouth daily.      . Calcium Carbonate-Vit D-Min 1200-1000 MG-UNIT CHEW Chew 1 capsule by mouth 2 (two) times daily.        [name] CALCIUM-MAGNESIUM-ZINC PO Take 1 tablet by mouth daily.      . Cholecalciferol (VITAMIN D3) 5000 UNITS CAPS Take 1 capsule by mouth daily.      . Cranberry-Cholecalciferol 4200-500 MG-UNIT CAPS Take 1 capsule by mouth daily.      [name] esomeprazole (NEXIUM) 40 MG capsule Take 1 capsule (40 mg total) by mouth daily before breakfast.  30 capsule  5  . ezetimibe-simvastatin (VYTORIN) 10-40 MG per tablet Take 1 tablet by mouth at bedtime.      [name] losartan-hydrochlorothiazide (HYZAAR) 50-12.5 MG per tablet Take 1 tablet by mouth daily.      [name] lubiprostone (AMITIZA) 8 MCG capsule Take 8 mcg by mouth 2 (two) times daily with a meal.      . metoprolol succinate (TOPROL-XL) 50 MG 24 hr tablet Take 50 mg by mouth daily. Take with or immediately following a meal.      . Potassium  99 MG TABS Take 99 mg by mouth daily.       [name] topiramate (TOPAMAX) 25 MG tablet Take 25 mg by mouth 2 (two) times daily.      . vitamin D, CHOLECALCIFEROL, 400 UNITS tablet Take 400 Units by mouth 2 (two) times daily.      [name] NITROSTAT 0.4 MG SL tablet         Physical Exam BP 137/73  Pulse 73  Resp 16  Ht 5\' 7"  (1.702 m)  Wt 170 lb (77.111 kg)  BMI 26.63 kg/m2  SpO62 57% 78 year old woman in no acute distress Cardiac regular rate and rhythm with occasional premature beat Lungs clear breath sounds bilaterally No cervical or supraclavicular adenopathy  Diagnostic Tests: Chest x-ray 05/23/2012 Stable unchanged from 2012  Impression: 78 year old woman with a history granulomatous disease. She's had no signs or symptoms related to that for many years. She did have multiple small nodules on CT scan. Last several years we follow her with a plain chest x-ray once a year to make sure that there is no nodules are large enough to be seen on the x-ray. Although this is not as sensitive as the CT scan, given the low probability that these are malignant I do not think the additional radiation  from the CT scan is warranted.  I did tell her that she could does have an x-ray done as part of her annual wellness check, but she wishes to continue to come see me once year.  Plan:  Return in one year with a chest x-ray to

## 2012-06-05 ENCOUNTER — Other Ambulatory Visit: Payer: Self-pay | Admitting: Family Medicine

## 2012-06-05 ENCOUNTER — Telehealth: Payer: Self-pay | Admitting: Family Medicine

## 2012-06-05 MED ORDER — CIPROFLOXACIN HCL 500 MG PO TABS: 500.0000 mg | ORAL_TABLET | Freq: Two times a day (BID) | ORAL | Status: AC

## 2012-06-05 NOTE — Telephone Encounter (Signed)
Please advise 

## 2012-06-05 NOTE — Telephone Encounter (Signed)
Cirpo sent Previous E coli She has to come in if she does not improve

## 2012-06-05 NOTE — Telephone Encounter (Signed)
Patient states that she has no way of coming in.

## 2012-06-05 NOTE — Telephone Encounter (Signed)
Patient aware.

## 2012-07-20 ENCOUNTER — Other Ambulatory Visit: Payer: Self-pay | Admitting: Family Medicine

## 2012-07-25 ENCOUNTER — Ambulatory Visit: Payer: Medicare Other | Admitting: Family Medicine

## 2012-07-27 ENCOUNTER — Ambulatory Visit: Payer: Medicare Other | Admitting: Family Medicine

## 2012-07-28 ENCOUNTER — Encounter (HOSPITAL_COMMUNITY): Payer: Self-pay | Admitting: *Deleted

## 2012-07-28 ENCOUNTER — Emergency Department (HOSPITAL_COMMUNITY): Payer: Medicare Other

## 2012-07-28 ENCOUNTER — Other Ambulatory Visit (HOSPITAL_COMMUNITY): Payer: Self-pay | Admitting: Internal Medicine

## 2012-07-28 ENCOUNTER — Emergency Department (HOSPITAL_COMMUNITY)
Admission: EM | Admit: 2012-07-28 | Discharge: 2012-07-28 | Disposition: A | Discharge status: Home / Self Care | Payer: Medicare Other | Attending: Emergency Medicine | Admitting: Emergency Medicine

## 2012-07-28 DIAGNOSIS — R109 Unspecified abdominal pain: Secondary | ICD-10-CM

## 2012-07-28 DIAGNOSIS — R110 Nausea: Secondary | ICD-10-CM | POA: Insufficient documentation

## 2012-07-28 DIAGNOSIS — R1013 Epigastric pain: Secondary | ICD-10-CM | POA: Insufficient documentation

## 2012-07-28 DIAGNOSIS — Z90710 Acquired absence of both cervix and uterus: Secondary | ICD-10-CM | POA: Insufficient documentation

## 2012-07-28 DIAGNOSIS — R509 Fever, unspecified: Secondary | ICD-10-CM | POA: Insufficient documentation

## 2012-07-28 DIAGNOSIS — K219 Gastro-esophageal reflux disease without esophagitis: Secondary | ICD-10-CM | POA: Insufficient documentation

## 2012-07-28 DIAGNOSIS — M810 Age-related osteoporosis without current pathological fracture: Secondary | ICD-10-CM | POA: Insufficient documentation

## 2012-07-28 DIAGNOSIS — E876 Hypokalemia: Secondary | ICD-10-CM | POA: Insufficient documentation

## 2012-07-28 DIAGNOSIS — F411 Generalized anxiety disorder: Secondary | ICD-10-CM | POA: Insufficient documentation

## 2012-07-28 DIAGNOSIS — E785 Hyperlipidemia, unspecified: Secondary | ICD-10-CM | POA: Insufficient documentation

## 2012-07-28 DIAGNOSIS — I10 Essential (primary) hypertension: Secondary | ICD-10-CM | POA: Insufficient documentation

## 2012-07-28 DIAGNOSIS — R079 Chest pain, unspecified: Secondary | ICD-10-CM

## 2012-07-28 DIAGNOSIS — B349 Viral infection, unspecified: Secondary | ICD-10-CM

## 2012-07-28 DIAGNOSIS — I4891 Unspecified atrial fibrillation: Secondary | ICD-10-CM

## 2012-07-28 DIAGNOSIS — B9789 Other viral agents as the cause of diseases classified elsewhere: Secondary | ICD-10-CM | POA: Insufficient documentation

## 2012-07-28 DIAGNOSIS — Z8739 Personal history of other diseases of the musculoskeletal system and connective tissue: Secondary | ICD-10-CM | POA: Insufficient documentation

## 2012-07-28 DIAGNOSIS — Z79899 Other long term (current) drug therapy: Secondary | ICD-10-CM | POA: Insufficient documentation

## 2012-07-28 LAB — INFLUENZA PANEL BY PCR (TYPE A & B)
H1N1 flu by pcr: NOT DETECTED
Influenza A By PCR: NEGATIVE
Influenza B By PCR: NEGATIVE

## 2012-07-28 LAB — URINALYSIS, ROUTINE W REFLEX MICROSCOPIC
Leukocytes, UA: NEGATIVE
Nitrite: NEGATIVE
Specific Gravity, Urine: 1.02 (ref 1.005–1.030)
Urobilinogen, UA: 4 mg/dL — ABNORMAL HIGH (ref 0.0–1.0)
pH: 6.5 (ref 5.0–8.0)

## 2012-07-28 LAB — URINE MICROSCOPIC-ADD ON

## 2012-07-28 LAB — COMPREHENSIVE METABOLIC PANEL
Albumin: 3.2 g/dL — ABNORMAL LOW (ref 3.5–5.2)
Alkaline Phosphatase: 85 U/L (ref 39–117)
BUN: 20 mg/dL (ref 6–23)
Chloride: 94 mEq/L — ABNORMAL LOW (ref 96–112)
Creatinine, Ser: 1.04 mg/dL (ref 0.50–1.10)
GFR calc Af Amer: 57 mL/min — ABNORMAL LOW (ref >90)
Glucose, Bld: 121 mg/dL — ABNORMAL HIGH (ref 70–99)
Potassium: 2.5 mEq/L — CL (ref 3.5–5.1)
Total Bilirubin: 0.7 mg/dL (ref 0.3–1.2)
Total Protein: 6.7 g/dL (ref 6.0–8.3)

## 2012-07-28 LAB — CBC WITH DIFFERENTIAL/PLATELET
Basophils Relative: 0 % (ref 0–1)
Eosinophils Absolute: 0 10*3/uL (ref 0.0–0.7)
HCT: 34.7 % — ABNORMAL LOW (ref 36.0–46.0)
Hemoglobin: 12.2 g/dL (ref 12.0–15.0)
Lymphs Abs: 0.8 10*3/uL (ref 0.7–4.0)
MCH: 32.6 pg (ref 26.0–34.0)
MCHC: 35.2 g/dL (ref 30.0–36.0)
Monocytes Absolute: 0.6 10*3/uL (ref 0.1–1.0)
Monocytes Relative: 6 % (ref 3–12)
RBC: 3.74 MIL/uL — ABNORMAL LOW (ref 3.87–5.11)

## 2012-07-28 LAB — LIPASE, BLOOD: Lipase: 14 U/L (ref 11–59)

## 2012-07-28 MED ORDER — ACETAMINOPHEN 650 MG RE SUPP
RECTAL | Status: AC
  Administered 2012-07-28: 650 mg via RECTAL
  Filled 2012-07-28: qty 1

## 2012-07-28 MED ORDER — SODIUM CHLORIDE 0.9 % IV BOLUS (SEPSIS)
700.0000 mL | Freq: Once | INTRAVENOUS | Status: AC
  Administered 2012-07-28: 700 mL via INTRAVENOUS

## 2012-07-28 MED ORDER — ONDANSETRON HCL 4 MG PO TABS: ORAL_TABLET | ORAL | Status: DC

## 2012-07-28 MED ORDER — ACETAMINOPHEN 650 MG RE SUPP
650.0000 mg | Freq: Once | RECTAL | Status: AC
  Administered 2012-07-28: 650 mg via RECTAL

## 2012-07-28 MED ORDER — POTASSIUM CHLORIDE ER 10 MEQ PO TBCR: 20.0000 meq | EXTENDED_RELEASE_TABLET | Freq: Two times a day (BID) | ORAL | Status: DC

## 2012-07-28 MED ORDER — POTASSIUM CHLORIDE 10 MEQ/100ML IV SOLN
10.0000 meq | INTRAVENOUS | Status: AC
  Administered 2012-07-28 (×4): 10 meq via INTRAVENOUS
  Filled 2012-07-28 (×3): qty 100

## 2012-07-28 MED ORDER — ACETAMINOPHEN 325 MG PO TABS
ORAL_TABLET | ORAL | Status: AC
  Administered 2012-07-28: 15:00:00
  Filled 2012-07-28: qty 2

## 2012-07-28 MED ORDER — SODIUM CHLORIDE 0.9 % IV SOLN
INTRAVENOUS | Status: DC
  Administered 2012-07-28: 09:00:00 via INTRAVENOUS

## 2012-07-28 MED ORDER — POTASSIUM CHLORIDE CRYS ER 20 MEQ PO TBCR
40.0000 meq | EXTENDED_RELEASE_TABLET | Freq: Once | ORAL | Status: AC
  Administered 2012-07-28: 40 meq via ORAL
  Filled 2012-07-28: qty 2

## 2012-07-28 MED ORDER — POTASSIUM CHLORIDE 10 MEQ/100ML IV SOLN
INTRAVENOUS | Status: AC
  Filled 2012-07-28: qty 100

## 2012-07-28 NOTE — ED Notes (Signed)
Patient to [facility]. No needs voiced at this time.

## 2012-07-28 NOTE — ED Notes (Signed)
CRITICAL VALUE ALERT  Critical value received:  Potassium 2.5  Date of notification:  07/28/2012  Time of notification:  0848  Critical value read back:yes  Nurse who received alert:  Hardie Pulley, RN  MD notified (1st page):  Lynelle Doctor  Time of first page:  0848  MD notified (2nd page):  Time of second page:  Responding MD:  Lynelle Doctor  Time MD responded:  [phone]

## 2012-07-28 NOTE — ED Notes (Signed)
Re-checked patient's temperature.  102.0 orally.  Notified nurse - Silva Bandy R.N.

## 2012-07-28 NOTE — ED Notes (Signed)
Delay in EKG due to nurse and lab - IV access and bloodwork.

## 2012-07-28 NOTE — ED Notes (Signed)
Please call Bea Graff for patient updates, changes or discharge. Number is [phone]

## 2012-07-28 NOTE — ED Notes (Signed)
Reports that she started feeling bad on Monday, states she fell backward onto her buttocks, states she started having soreness in her left ribs after 3 or 4 days.  States her appetite is poor, complains of nausea, no diarrhea, reports constipation at times, last BM yesterday.  States she started running a fever at home yesterday, highest recorded 103.4 this morning, reports chills.  Denies cough or shortness of breath.  States that yesterday she went to see her cardiologist for upper abdominal pain.  Complains of upper abdominal pain today as well, points to epigastric region.  Family reports that the patient took 2 nitroglycerin tablets at home on Wednesday without relief.

## 2012-07-28 NOTE — ED Notes (Signed)
Reports nausea, decreased appetite, abd pain, fever x 2 days; also c/o left rib pain s/p fall 4 days ago.

## 2012-07-28 NOTE — ED Provider Notes (Signed)
History  This chart was scribed for Ward Givens, MD by Madaline Brilliant, ED Scribe. The patient was seen in room APA06/APA06. Patient's care was started at 0706.   CSN: [id number]  Arrival date & time 07/28/12  4098   First MD Initiated Contact with Patient 07/28/12 [phone]      Chief Complaint  Patient presents with  . Nausea  . Abdominal Pain  . Fever    The history is provided by the patient and a caregiver. No language interpreter was used.   Erin [name] is a 79 y.o. female who presents to the Emergency Department complaining of abdominal pain with  moderate, constant fever with a duration of 2 days. She fell backwards onto her bottom on Monday, 4 days ago, after feeling dizzy when trying to move her recliner.  She complains of worsening, moderate, constant soreness in her left abdominal area that started on Wednesday 2 days ago. Abdominal pain is described as burning and a  tightness, rated by patient as 5/10. Eating certain foods does not change pain. Lying down too quickly makes pain worse. Sitting up from a lying position makes pain feel better.  She is not eating and denies SOB, urinary frequency, cough, diarrhea, smoking, dysuria, or vomiting but states that she has nausea. Her last BM was yesterday and was "mushy." She states that her previous BM the day before was hard. She uses a suppository daily to help with BMs.  She states that she has had a fever since Wednesday two days ago and also yesterday and that her fever today was 103.4 with chills. She has had cholecystectomy, appendectomy, and a hysterectomy.  She saw her cardiologist Dr. Rennis Golden yesterday for pains in her upper abdomen that radiated to her back and she states that he felt it was her GERD' she also sees him for bradycardia. Has a history of acid reflux. Nexium provides relief. She states that she had a bad episode of this pain the day before before she fell and took nitroglycerin with no relief. She uses a walker. She states  that she had similar pain in the past that was diagnosed as acid reflux. Endoscopy in Feb of last year was normal.  She states she is chronically constipated. Patient lives with son. Family states that tremors began after three surgeries in the last year to repair broken bones but have gotten progressively worse in the last week.  PCP Dr Lodema Hong Cardiologist Dr Rennis Golden GI Dr Darrick Penna  Past Medical History  Diagnosis Date  . Anxiety   . Arthritis   . Hyperlipidemia   . Hypertension   . Osteoporosis   . GERD (gastroesophageal reflux disease)     Past Surgical History  Procedure Date  . Appendectomy   . Cholecystectomy   . Abdominal hysterectomy   . Fracture surgery     X 4 LEFT ARM  . Benign cyst removed from kidney and lung, bilateral mastectomy   . ,bilateral great toenail removal   . Knee arthroscopy 2012    right knee, Dr Romeo Apple  . Left forearm   . Breast surgery     bilateral 2000approx  . Tonsillectomy   . R hand surgery   . Bronchoscopy 02/15/2000  . Right vats. "  . Right upper lobe wedge resection. "  . Upper gastrointestinal endoscopy 11/25/1999    Esophagitis/ Normal proximal esophagus, stomach and duodenum  . Colonoscopy 01/17/2009    Dr. Arlyce Dice : Internal hemorrhoids/Diverticula, scattered in the ascending colon/  Moderate diverticulosis ascending colon to sigmoid colon  . Esophagogastroduodenoscopy  04/23/2003    Dr. Arlyce Dice: HIATAL HERNIA  . Colonoscopy 01/16/2001    Normal  . Cataract extraction, bilateral     Family History  Problem Relation Age of Onset  . Diabetes Mother   . Heart disease Mother   . Cancer Sister     KIDNEY  . Heart disease Brother   . Heart disease Sister   . Cancer Sister     BREAST  . Heart disease Brother   . Colon cancer Sister     History  Substance Use Topics  . Smoking status: Never Smoker   . Smokeless tobacco: Not on file  . Alcohol Use: No  Lives at home Lives with son Uses a walker and cane  OB History     Grav Para Term Preterm Abortions TAB SAB Ect Mult Living                  Review of Systems  Constitutional: Positive for fever and chills.  Gastrointestinal: Positive for nausea and abdominal pain.  All other systems reviewed and are negative.    Allergies  Codeine and Morphine and related  Home Medications   Current Outpatient Rx  Name  Route  Sig  Dispense  Refill  . AMLODIPINE BESYLATE 5 MG PO TABS   Oral   Take 5 mg by mouth daily.         [name] CALCIUM CARBONATE-VIT D-MIN 1200-1000 MG-UNIT PO CHEW   Oral   Chew 1 capsule by mouth 2 (two) times daily.           [name] CALCIUM-MAGNESIUM-ZINC PO   Oral   Take 1 tablet by mouth daily.         [name] VITAMIN D3 5000 UNITS PO CAPS   Oral   Take 1 capsule by mouth daily.         [name] CRANBERRY-CHOLECALCIFEROL 4200-500 MG-UNIT PO CAPS   Oral   Take 1 capsule by mouth daily.         [name] ESOMEPRAZOLE MAGNESIUM 40 MG PO CPDR   Oral   Take 1 capsule (40 mg total) by mouth daily before breakfast.   30 capsule   5   . EZETIMIBE-SIMVASTATIN 10-40 MG PO TABS   Oral   Take 1 tablet by mouth at bedtime.         [name] LOSARTAN POTASSIUM-HCTZ 50-12.5 MG PO TABS   Oral   Take 1 tablet by mouth daily.         [name] LOSARTAN POTASSIUM-HCTZ 50-12.5 MG PO TABS      TAKE 1 TABLET BY MOUTH DAILY.   30 tablet   3   . LUBIPROSTONE 8 MCG PO CAPS   Oral   Take 8 mcg by mouth 2 (two) times daily with a meal.         . METOPROLOL SUCCINATE ER 50 MG PO TB24   Oral   Take 50 mg by mouth daily. Take with or immediately following a meal.         . NITROSTAT 0.4 MG SL SUBL               . POTASSIUM 99 MG PO TABS   Oral   Take 99 mg by mouth daily.          . TOPIRAMATE 25 MG PO TABS   Oral   Take 25 mg by mouth 2 (two) times daily.         [name]  VITAMIN D 400 UNITS PO TABS   Oral   Take 400 Units by mouth 2 (two) times daily.           Triage vitals:  BP 134/56  Pulse 108  Temp 100.5 F (38.1 C)  Resp 20  Ht 5\' 8"   (1.727 m)  Wt 165 lb (74.844 kg)  BMI 25.09 kg/m2  SpO2 93%  Vital signs normal except tachycardia   Physical Exam  Nursing note and vitals reviewed. Constitutional: She is oriented to person, place, and time. She appears well-developed and well-nourished.  Non-toxic appearance. She does not appear ill. No distress.  HENT:  Head: Normocephalic and atraumatic.  Right Ear: External ear normal.  Left Ear: External ear normal.  Nose: Nose normal. No mucosal edema or rhinorrhea.  Mouth/Throat: Oropharynx is clear and moist and mucous membranes are normal. No dental abscesses or uvula swelling.       Her tongue is really dry.  Eyes: Conjunctivae normal and EOM are normal. Pupils are equal, round, and reactive to light.  Neck: Normal range of motion and full passive range of motion without pain. Neck supple.  Cardiovascular: Normal rate, regular rhythm and normal heart sounds.  Exam reveals no gallop and no friction rub.   No murmur heard. Pulmonary/Chest: Effort normal and breath sounds normal. No respiratory distress. She has no wheezes. She has no rhonchi. She has no rales. She exhibits no tenderness and no crepitus.         Area of pain noted  Abdominal: Soft. Normal appearance and bowel sounds are normal. She exhibits no distension. There is no tenderness. There is no rebound and no guarding.  Musculoskeletal: Normal range of motion. She exhibits no edema and no tenderness.       Superficial varicosities of lower extremities. She has kyphosis.  Neurological: She is alert and oriented to person, place, and time. She has normal strength. No cranial nerve deficit.       Chronic, prior tremor at rest and with purposeful movement.  Skin: Skin is warm, dry and intact. No rash noted. No erythema. No pallor.       She feels hot to touch.  Psychiatric: She has a normal mood and affect. Her speech is normal and behavior is normal. Her mood appears not anxious.    ED Course  Procedures  (including critical care time)   Medications  0.9 %  sodium chloride infusion (  Intravenous New Bag/Given 07/28/12 0920)  potassium chloride 10 MEQ/100ML IVPB (   Not Given 07/28/12 1645)  potassium chloride (K-DUR) 10 MEQ tablet (not administered)  sodium chloride 0.9 % bolus 700 mL (0 mL Intravenous Stopped 07/28/12 0919)  acetaminophen (TYLENOL) suppository 650 mg (650 mg Rectal Given 07/28/12 0803)  potassium chloride 10 mEq in 100 mL IVPB (10 mEq Intravenous New Bag/Given 07/28/12 1644)  potassium chloride SA (K-DUR,KLOR-CON) CR tablet 40 mEq (40 mEq Oral Given 07/28/12 1157)  acetaminophen (TYLENOL) 325 MG tablet (   Given 07/28/12 1513)    DIAGNOSTIC STUDIES: Oxygen Saturation is 93% on room air, low by my interpretation.    COORDINATION OF CARE: 0710 - Patient informed of current plan for treatment and evaluation and agrees with plan at this time.   1136 Upon recheck, pt appears better and states that her pain and nausea are gone. She was informed of low potassium. She is to be given 40 meq of potasium IV and orally. Patient informed of current plan for treatment and evaluation  and agrees with plan at this time.  1435 Recheck: Discussed lab results with pt and pt's family. Pt has fever. Will order influenza test and reevaluate pt.    18:00 pt remains abdominal/chest pain free and without nausea. She did spike a fever in the ED. She denies any wounds or anything else that could be causing her fever.   Results for orders placed during the hospital encounter of 07/28/12  CBC WITH DIFFERENTIAL      Component Value Range   WBC 10.2  4.0 - 10.5 K/uL   RBC 3.74 (*) 3.87 - 5.11 MIL/uL   Hemoglobin 12.2  12.0 - 15.0 g/dL   HCT 21.3 (*) 08.6 - 57.8 %   MCV 92.8  78.0 - 100.0 fL   MCH 32.6  26.0 - 34.0 pg   MCHC 35.2  30.0 - 36.0 g/dL   RDW 46.9  62.9 - 52.8 %   Platelets 189  150 - 400 K/uL   Neutrophils Relative 86 (*) 43 - 77 %   Neutro Abs 8.8 (*) 1.7 - 7.7 K/uL   Lymphocytes  Relative 7 (*) 12 - 46 %   Lymphs Abs 0.8  0.7 - 4.0 K/uL   Monocytes Relative 6  3 - 12 %   Monocytes Absolute 0.6  0.1 - 1.0 K/uL   Eosinophils Relative 0  0 - 5 %   Eosinophils Absolute 0.0  0.0 - 0.7 K/uL   Basophils Relative 0  0 - 1 %   Basophils Absolute 0.0  0.0 - 0.1 K/uL  COMPREHENSIVE METABOLIC PANEL      Component Value Range   Sodium 131 (*) 135 - 145 mEq/L   Potassium 2.5 (*) 3.5 - 5.1 mEq/L   Chloride 94 (*) 96 - 112 mEq/L   CO2 26  19 - 32 mEq/L   Glucose, Bld 121 (*) 70 - 99 mg/dL   BUN 20  6 - 23 mg/dL   Creatinine, Ser 4.13  0.50 - 1.10 mg/dL   Calcium 9.0  8.4 - 24.4 mg/dL   Total Protein 6.7  6.0 - 8.3 g/dL   Albumin 3.2 (*) 3.5 - 5.2 g/dL   AST 23  0 - 37 U/L   ALT 10  0 - 35 U/L   Alkaline Phosphatase 85  39 - 117 U/L   Total Bilirubin 0.7  0.3 - 1.2 mg/dL   GFR calc non Af Amer 49 (*) >90 mL/min   GFR calc Af Amer 57 (*) >90 mL/min  LIPASE, BLOOD      Component Value Range   Lipase 14  11 - 59 U/L  URINALYSIS, ROUTINE W REFLEX MICROSCOPIC      Component Value Range   Color, Urine YELLOW  YELLOW   APPearance CLEAR  CLEAR   Specific Gravity, Urine 1.020  1.005 - 1.030   pH 6.5  5.0 - 8.0   Glucose, UA NEGATIVE  NEGATIVE mg/dL   Hgb urine dipstick MODERATE (*) NEGATIVE   Bilirubin Urine NEGATIVE  NEGATIVE   Ketones, ur 15 (*) NEGATIVE mg/dL   Protein, ur 30 (*) NEGATIVE mg/dL   Urobilinogen, UA 4.0 (*) 0.0 - 1.0 mg/dL   Nitrite NEGATIVE  NEGATIVE   Leukocytes, UA NEGATIVE  NEGATIVE  TROPONIN I      Component Value Range   Troponin I <0.30  <0.30 ng/mL  URINE MICROSCOPIC-ADD ON      Component Value Range   Squamous Epithelial / LPF FEW (*) RARE  WBC, UA 0-2  <3 WBC/hpf   RBC / HPF 0-2  <3 RBC/hpf   Casts WBC CAST (*) NEGATIVE  INFLUENZA PANEL BY PCR      Component Value Range   Influenza A By PCR NEGATIVE  NEGATIVE   Influenza B By PCR NEGATIVE  NEGATIVE   H1N1 flu by pcr NOT DETECTED  NOT DETECTED   Laboratory interpretation all normal  except     Dg Abd Acute W/chest  07/28/2012  *RADIOLOGY REPORT*  Clinical Data: Nausea, abdominal pain, fever  ACUTE ABDOMEN SERIES (ABDOMEN 2 VIEW & CHEST 1 VIEW)  Comparison: Chest radiographs 05/23/2012, 06/09/2011 Correlation:  CT abdomen and pelvis 02/16/2012  Findings: Enlargement of cardiac silhouette. Tortuous aorta with atherosclerotic calcification. Upper normal prominence of central pulmonary arteries. Bronchitic changes with left basilar atelectasis. Density in right upper lobe may represent scarring, little changed since 06/09/2011. No pneumothorax. Diffuse osseous demineralization.  Nonobstructive bowel gas pattern. No bowel dilatation, bowel wall thickening, or free intraperitoneal air. Surgical clips right upper quadrant question cholecystectomy. 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 Few small right paraspinal calcifications at L5 are unchanged since prior CT. No definite urinary tract calcification.  IMPRESSION: Enlargement of cardiac silhouette. Bronchitic changes with left basilar atelectasis and probable right upper lobe scarring. No acute intra abdominal abnormalities.   Original Report Authenticated By: Ulyses Southward, M.D.    1435 On follow-up, patient was informed of he lab and exam results. Pt informed that test will be done to check for influenza and also her potassium level will be monitor as it is low.   Date: 07/28/2012  Rate: 103  Rhythm: atrial fibrillation  QRS Axis: normal  Intervals: normal  ST/T Wave abnormalities: nonspecific ST/T changes  Conduction Disutrbances:none  Narrative Interpretation:   Old EKG Reviewed: changes noted from 10/22/2009 was in NSR    1. Abdominal pain   2. Hypokalemia   3. Fever   4. Viral illness   5. Nausea     New Prescriptions   ONDANSETRON (ZOFRAN) 4 MG TABLET    Take 1 or 2 po Q 6hrs for nausea or vomiting   POTASSIUM CHLORIDE (K-DUR) 10 MEQ  TABLET    Take 2 tablets (20 mEq total) by mouth 2 (two) times daily.    Plan discharge  Devoria Albe, MD, FACEP   MDM   I personally performed the services described in this documentation, which was scribed in my presence. The recorded information has been reviewed and considered.  Devoria Albe, MD, Armando Gang        Ward Givens, MD 07/28/12 [phone]

## 2012-07-28 NOTE — ED Notes (Signed)
Influenza panel collected by [facility]

## 2012-07-29 LAB — URINE CULTURE

## 2012-07-31 ENCOUNTER — Ambulatory Visit (INDEPENDENT_AMBULATORY_CARE_PROVIDER_SITE_OTHER): Payer: Medicare Other | Admitting: Family Medicine

## 2012-07-31 ENCOUNTER — Other Ambulatory Visit: Payer: Self-pay | Admitting: Family Medicine

## 2012-07-31 ENCOUNTER — Encounter: Payer: Self-pay | Admitting: Family Medicine

## 2012-07-31 VITALS — BP 126/78 | HR 100 | Resp 20 | Ht 69.0 in

## 2012-07-31 DIAGNOSIS — J40 Bronchitis, not specified as acute or chronic: Secondary | ICD-10-CM

## 2012-07-31 DIAGNOSIS — N3000 Acute cystitis without hematuria: Secondary | ICD-10-CM

## 2012-07-31 DIAGNOSIS — Z9181 History of falling: Secondary | ICD-10-CM

## 2012-07-31 DIAGNOSIS — I10 Essential (primary) hypertension: Secondary | ICD-10-CM

## 2012-07-31 MED ORDER — PENICILLIN V POTASSIUM 500 MG PO TABS: 500.0000 mg | ORAL_TABLET | Freq: Three times a day (TID) | ORAL | Status: DC

## 2012-07-31 NOTE — Patient Instructions (Addendum)
F/u in 3 weeks.Please call back if feeling worse, you may need short term hospitalization based on your current complaints.  Please sign a form with the contact info for your niece who brought you today to be used as a contact to discuss your test results and someone who can call in about you  Labs today cbc and diff and chem 7  You are being treated for bronchitis with penicillin 3 times daily for 10 days.  Your urine is being sent for culture, still need to submit specimen  Please make a BIG EFFORT to eat small meals frequently, this will help you to get better. Also drink sufficient fluids  Careful not to fall again  PLEASE  I hope you feel better soon

## 2012-08-01 DIAGNOSIS — Z9181 History of falling: Secondary | ICD-10-CM | POA: Insufficient documentation

## 2012-08-01 DIAGNOSIS — N3000 Acute cystitis without hematuria: Secondary | ICD-10-CM | POA: Insufficient documentation

## 2012-08-01 LAB — CBC WITH DIFFERENTIAL/PLATELET
Basophils Relative: 0 % (ref 0–1)
Eosinophils Absolute: 0 10*3/uL (ref 0.0–0.7)
Lymphs Abs: 1.6 10*3/uL (ref 0.7–4.0)
MCH: 30.9 pg (ref 26.0–34.0)
Neutro Abs: 10.1 10*3/uL — ABNORMAL HIGH (ref 1.7–7.7)
Neutrophils Relative %: 81 % — ABNORMAL HIGH (ref 43–77)
Platelets: 289 10*3/uL (ref 150–400)
RBC: 3.75 MIL/uL — ABNORMAL LOW (ref 3.87–5.11)
WBC: 12.6 10*3/uL — ABNORMAL HIGH (ref 4.0–10.5)

## 2012-08-01 LAB — BASIC METABOLIC PANEL
BUN: 21 mg/dL (ref 6–23)
Calcium: 8.9 mg/dL (ref 8.4–10.5)
Chloride: 100 mEq/L (ref 96–112)
Creat: 1.06 mg/dL (ref 0.50–1.10)

## 2012-08-01 NOTE — Assessment & Plan Note (Signed)
Controlled, no change in medication  

## 2012-08-01 NOTE — Progress Notes (Signed)
  Subjective:    Patient ID: Erin [name], female    DOB: Jun 13, 1930, 79 y.o.   MRN: [id number]  HPI 5 day h/o fatigue, poor appetite, and increased tremor. Also recently fell at home trying to move a cair. Seen in ED, 4 days ago. Though pt has no localizing symptoms of infection, her WBC had a left shift tho still normal, and cXr was abnormal. Symptoms have improved slightly, though she is currently 40 %  Of baseline , much less interactive and excited , and reports no appetite   Review of Systems See HPI Denies recent fever or chills. Denies sinus pressure, nasal congestion, ear pain or sore throat. Denies chest congestion,  or wheezing.Slight cough, non productiveDenies chest pains, palpitations and leg swelling Denies abdominal pain, nausea, vomiting,diarrhea or constipation.   Denies dysuria, frequency, hesitancy or incontinence. C/o joint pain with limited mobility and instability, ambulates with a cane. Denies headaches, seizures, numbness, or tingling. Denies depression,has increased  anxiety or insomnia. Denies skin break down or rash.        Objective:   Physical Exam  Patient alert and oriented and in no cardiopulmonary distress.Ill appearing and anxious, much less engaging and happy  HEENT: No facial asymmetry, EOMI, no sinus tenderness,  oropharynx pink and moist.  Neck supple no adenopathy.  Chest: Decreased air entry in left lower lobe  CVS: S1, S2 no murmurs, no S3.  ABD: Soft non tender. Bowel sounds normal.  Ext: No edema  MS: decreased ROM spine, shoulders, hips and knees.  Skin: Intact, no ulcerations or rash noted.  Psych: Good eye contact, normal affect. Memory intact  anxious not depressed appearing.  CNS: CN 2-12 intact, power, tone and sensation normal throughout.       Assessment & Plan:

## 2012-08-01 NOTE — Assessment & Plan Note (Signed)
Home safety and practice of safe behavior stressed to reduce fall risk

## 2012-08-01 NOTE — Assessment & Plan Note (Signed)
Acute illness with abn lab data, need to eval for UTI, will send rept urine for c/s only

## 2012-08-01 NOTE — Assessment & Plan Note (Signed)
Fatigue, left shift , abn cxr with cough, treat presumptively with penicillin and close f/u

## 2012-08-02 ENCOUNTER — Encounter (HOSPITAL_COMMUNITY): Payer: Medicare Other

## 2012-08-02 LAB — CULTURE, BLOOD (ROUTINE X 2): Culture: NO GROWTH

## 2012-08-02 LAB — IRON AND TIBC: UIBC: 161 ug/dL (ref 125–400)

## 2012-08-03 LAB — URINE CULTURE
Colony Count: NO GROWTH
Organism ID, Bacteria: NO GROWTH

## 2012-08-04 ENCOUNTER — Telehealth: Payer: Self-pay | Admitting: Family Medicine

## 2012-08-04 NOTE — Telephone Encounter (Signed)
Patient aware.

## 2012-08-05 ENCOUNTER — Other Ambulatory Visit: Payer: Self-pay | Admitting: Family Medicine

## 2012-08-14 ENCOUNTER — Ambulatory Visit (INDEPENDENT_AMBULATORY_CARE_PROVIDER_SITE_OTHER): Payer: Medicare Other | Admitting: Family Medicine

## 2012-08-14 ENCOUNTER — Encounter: Payer: Self-pay | Admitting: Family Medicine

## 2012-08-14 VITALS — BP 120/80 | HR 71 | Resp 16 | Ht 69.0 in | Wt 163.0 lb

## 2012-08-14 DIAGNOSIS — K219 Gastro-esophageal reflux disease without esophagitis: Secondary | ICD-10-CM

## 2012-08-14 DIAGNOSIS — G20 Parkinson's disease: Secondary | ICD-10-CM

## 2012-08-14 DIAGNOSIS — J40 Bronchitis, not specified as acute or chronic: Secondary | ICD-10-CM

## 2012-08-14 DIAGNOSIS — R5381 Other malaise: Secondary | ICD-10-CM

## 2012-08-14 DIAGNOSIS — R7303 Prediabetes: Secondary | ICD-10-CM

## 2012-08-14 DIAGNOSIS — I10 Essential (primary) hypertension: Secondary | ICD-10-CM

## 2012-08-14 DIAGNOSIS — R5383 Other fatigue: Secondary | ICD-10-CM

## 2012-08-14 DIAGNOSIS — R7309 Other abnormal glucose: Secondary | ICD-10-CM

## 2012-08-14 LAB — BASIC METABOLIC PANEL
CO2: 30 mEq/L (ref 19–32)
Chloride: 103 mEq/L (ref 96–112)
Potassium: 4.1 mEq/L (ref 3.5–5.3)
Sodium: 137 mEq/L (ref 135–145)

## 2012-08-14 LAB — TSH: TSH: 1.324 u[IU]/mL (ref 0.350–4.500)

## 2012-08-14 LAB — CBC WITH DIFFERENTIAL/PLATELET
Lymphocytes Relative: 37 % (ref 12–46)
Lymphs Abs: 2.8 10*3/uL (ref 0.7–4.0)
Neutrophils Relative %: 52 % (ref 43–77)
Platelets: 316 10*3/uL (ref 150–400)
RBC: 3.92 MIL/uL (ref 3.87–5.11)
WBC: 7.6 10*3/uL (ref 4.0–10.5)

## 2012-08-14 LAB — HEMOGLOBIN A1C: Hgb A1c MFr Bld: 6.3 % — ABNORMAL HIGH (ref <5.7)

## 2012-08-14 MED ORDER — ESOMEPRAZOLE MAGNESIUM 40 MG PO CPDR: 40.0000 mg | DELAYED_RELEASE_CAPSULE | Freq: Every day | ORAL | Status: DC

## 2012-08-14 NOTE — Patient Instructions (Addendum)
F/u in early May, please call if you need me befiore  TSH, hBA1C cbc and diff today and chem 7   Please keep appts with neurology for the tremor.  You need to make an appt with Dr Kendell Bane for follow up , espescailly since you have iron deficiency  Continue once daily, stool softeners daily, add fiber and continue raisins /prunes, drink water 60 ounces daily

## 2012-08-15 ENCOUNTER — Telehealth: Payer: Self-pay | Admitting: Family Medicine

## 2012-08-15 NOTE — Telephone Encounter (Signed)
Patient aware of results and no need for potassium.

## 2012-08-21 ENCOUNTER — Ambulatory Visit: Payer: Medicare Other | Admitting: Family Medicine

## 2012-08-22 ENCOUNTER — Encounter: Payer: Self-pay | Admitting: Family Medicine

## 2012-08-25 NOTE — Assessment & Plan Note (Signed)
Marked improvement clinically, WBC also normalized

## 2012-08-25 NOTE — Assessment & Plan Note (Signed)
HBA1C in 02/14 is 6.3, pt encouraged to reduce sweets and carbs so she does not become diabetic

## 2012-08-25 NOTE — Assessment & Plan Note (Signed)
Current c/o incrreased tremor, has upcoming follow up  appt with neurolgy

## 2012-08-25 NOTE — Assessment & Plan Note (Signed)
Controlled, no change in medication  

## 2012-08-25 NOTE — Progress Notes (Signed)
  Subjective:    Patient ID: Erin [name], female    DOB: 22-Aug-1929, 79 y.o.   MRN: [id number]  HPI Pt in for f/u after being recently treated for bronchitis and low potassium States she is feeling better than at her last visit, but is still not back ot herbaseline.  She c/o fatigue, and appetite is still not back to normal. C/o increased tremors, at one time she was diagnosed with Parkinson's states another neurologist old he this was no the case, however she is now concerned about the tremors   Review of Systems See HPI Denies recent fever or chills. Denies sinus pressure, nasal congestion, ear pain or sore throat. Denies chest congestion, productive cough or wheezing. Denies chest pains, palpitations and leg swelling Denies abdominal pain, nausea, vomiting,diarrhea or constipation.   Denies dysuria, frequency, hesitancy or incontinence. Chronic  joint pain,  and limitation in mobility. Denies headaches, seizures, numbness, or tingling. Denies depression, anxiety or insomnia. Denies skin break down or rash.        Objective:   Physical Exam  Patient alert and oriented and in no cardiopulmonary distress.Still not back to baseline however, slightly ill appearing  HEENT: No facial asymmetry, EOMI, no sinus tenderness,  oropharynx pink and moist.  Neck adequate though reduced ROM no adenopathy.  Chest: Clear to auscultation bilaterally.decreased though adequate air entry  CVS: S1, S2 no murmurs, no S3.  ABD: Soft non tender. Bowel sounds normal.  Ext: No edema  ZH:YQMVHQION ROM spine, shoulders, hips and knees.  Skin: Intact, no ulcerations or rash noted.  Psych: Good eye contact, normal affect. Memory intact mildly  anxious  Not  depressed appearing.  CNS: CN 2-12 intact, power, tone and sensation normal throughout.tremor at rest, no cogwheeling       Assessment & Plan:

## 2012-08-29 ENCOUNTER — Other Ambulatory Visit (HOSPITAL_COMMUNITY): Payer: Self-pay | Admitting: Internal Medicine

## 2012-08-29 DIAGNOSIS — R079 Chest pain, unspecified: Secondary | ICD-10-CM

## 2012-08-31 ENCOUNTER — Ambulatory Visit (HOSPITAL_COMMUNITY)
Admission: RE | Admit: 2012-08-31 | Discharge: 2012-08-31 | Disposition: A | Discharge status: Home / Self Care | Payer: Medicare Other | Source: Ambulatory Visit | Attending: Internal Medicine | Admitting: Internal Medicine

## 2012-08-31 ENCOUNTER — Encounter (HOSPITAL_COMMUNITY)
Admission: RE | Admit: 2012-08-31 | Discharge: 2012-08-31 | Disposition: A | Discharge status: Home / Self Care | Payer: Medicare Other | Source: Ambulatory Visit | Attending: Internal Medicine | Admitting: Internal Medicine

## 2012-08-31 ENCOUNTER — Encounter (HOSPITAL_COMMUNITY): Payer: Self-pay | Admitting: Internal Medicine

## 2012-08-31 ENCOUNTER — Encounter: Payer: Self-pay | Admitting: Gastroenterology

## 2012-08-31 DIAGNOSIS — Z9289 Personal history of other medical treatment: Secondary | ICD-10-CM

## 2012-08-31 DIAGNOSIS — R079 Chest pain, unspecified: Secondary | ICD-10-CM | POA: Insufficient documentation

## 2012-08-31 HISTORY — DX: Personal history of other medical treatment: Z92.89

## 2012-08-31 MED ORDER — SODIUM CHLORIDE 0.9 % IJ SOLN
INTRAMUSCULAR | Status: AC
  Administered 2012-08-31: 10 mL via INTRAVENOUS
  Filled 2012-08-31: qty 10

## 2012-08-31 MED ORDER — TECHNETIUM TC 99M SESTAMIBI - CARDIOLITE
30.0000 | Freq: Once | INTRAVENOUS | Status: AC | PRN
  Administered 2012-08-31: 30 via INTRAVENOUS

## 2012-08-31 MED ORDER — REGADENOSON 0.4 MG/5ML IV SOLN
INTRAVENOUS | Status: AC
  Administered 2012-08-31: 0.4 mg via INTRAVENOUS
  Filled 2012-08-31: qty 5

## 2012-08-31 MED ORDER — TECHNETIUM TC 99M SESTAMIBI - CARDIOLITE
10.0000 | Freq: Once | INTRAVENOUS | Status: AC | PRN
  Administered 2012-08-31: 11:00:00 10 via INTRAVENOUS

## 2012-08-31 NOTE — Progress Notes (Signed)
Stress Lab Nurses Notes - Erin [name]  Erin [name] [facility] 08/31/2012 Reason for doing test: Chest Pain and AFib Type of test: Marlane Hatcher Nurse performing test: Parke Poisson, RN Nuclear Medicine Tech: Lou Cal Echo Tech: Not Applicable MD performing test: Dr. Rennis Golden Family MD: Lodema Hong Test explained and consent signed: yes IV started: 22g jelco, Saline lock flushed, No redness or edema and Saline lock started in radiology Symptoms: SOB Treatment/Intervention: None Reason test stopped: protocol completed After recovery IV was: Discontinued via X-ray tech and No redness or edema Patient to return to Nuc. Med at : 13:45 Patient discharged: Home Patient's Condition upon discharge was: stable Comments: During test BP 150/70 & HR 102.  Recovery BP 121/65 & HR 80.  Symptoms resolved in recovery. Erskine Speed T

## 2012-08-31 NOTE — CV Procedure (Signed)
[facility]       [facility] STRESS TEST    Erin [name] is a 79 y.o. female     MRN : [id number]     DOB: 07/03/1930  Procedure Date: 08/31/2012  Background: Indication for Stress Test:  Chest pain, pain between shoulder blades Cardiac Risk Factors: Family History - CAD, Hypertension, Lipids, Obesity and A-fib  Symptoms:  Chest Pain and Back pain, Weakness  Procedure: The patient received IV Lexiscan 0.4 mg over 15-seconds.  Technetium 60m Sestamibi injected at 30-seconds.  There were no significant changes with Lexiscan. There was a within normal limits blood pressure response with lexiscan.   Quantitative spect images were obtained after a 45 minute delay and are available in a separate report from radiology.  Findings: No significant ST segment change with adenosine.  Impression: Tolerated lexiscan nuclear stress test well. Awaiting nuclear perfusion image interpretation.  Chrystie Nose, MD, [facility] Attending Cardiologist [facility]  Time Spent Directly with the Patient:  15 minutes  Tonatiuh Mallon C 08/31/2012, 12:48 PM

## 2012-09-01 ENCOUNTER — Other Ambulatory Visit: Payer: Self-pay | Admitting: Family Medicine

## 2012-09-02 ENCOUNTER — Encounter: Payer: Self-pay | Admitting: Neurology

## 2012-09-02 DIAGNOSIS — R413 Other amnesia: Secondary | ICD-10-CM

## 2012-09-02 DIAGNOSIS — D518 Other vitamin B12 deficiency anemias: Secondary | ICD-10-CM

## 2012-09-02 DIAGNOSIS — G252 Other specified forms of tremor: Secondary | ICD-10-CM

## 2012-09-02 DIAGNOSIS — R6889 Other general symptoms and signs: Secondary | ICD-10-CM

## 2012-09-04 ENCOUNTER — Ambulatory Visit (INDEPENDENT_AMBULATORY_CARE_PROVIDER_SITE_OTHER): Payer: Medicare Other | Admitting: Gastroenterology

## 2012-09-04 ENCOUNTER — Encounter: Payer: Self-pay | Admitting: Gastroenterology

## 2012-09-04 VITALS — BP 106/62 | HR 89 | Temp 98.2°F | Ht 66.0 in | Wt 166.0 lb

## 2012-09-04 DIAGNOSIS — K5900 Constipation, unspecified: Secondary | ICD-10-CM

## 2012-09-04 DIAGNOSIS — K219 Gastro-esophageal reflux disease without esophagitis: Secondary | ICD-10-CM

## 2012-09-04 NOTE — Progress Notes (Signed)
Referring Provider: Kerri Perches, MD Primary Care Physician:  Syliva Overman, MD Primary Gastroenterologist: Dr. Darrick Penna   Chief Complaint  Patient presents with  . Follow-up    HPI:   79 year old female presenting today in follow-up after EGD by Dr. Darrick Penna in Sept 2013. Findings of Schatzki's ring likely cause of mild dysphagia, dilation with Savary dilator. Multiple sessile polyps, benign path. Next TCS due in 2015 due to family history of colon cancer (sister).  CT on file from Aug 2013 secondary to abdominal pain. Stable renal cyst noted.  Returns today stating in the interim from the last visit, she was treated for hypokalemia. Had a stress chest 08/31/12 with Dr. Rennis Golden. States will be sitting still in her [employer]ch, feels nauseated all of the sudden, lasts for a few minutes then passes. Noted about 2 hours after eating. Discomfort starts mid back, radiates through to epigastric/chest area. Sometimes goes the other way. Notes worsened with exertion such as putting dishes away in cabinet. Shoots through to back. Uses rice pack heated up, which relieves discomfort. Pain in back severe during Christmas while cooking.  Continues to report right flank pain, reported as sore. Worsened when laying on it. Nephrologist: Dr. Annabell Howells.  If doesn't take Nexium, severe burning. Sometimes she skips so that she can give her grandchildren some money. If taking Nexium, no chest discomfort. Still has pain in back. Taking Miralax for constipation. Geritol. Not on Amitiza. Doesn't want to see pain management.    Past Medical History  Diagnosis Date  . Anxiety   . Arthritis   . Hyperlipidemia   . Hypertension   . Osteoporosis   . GERD (gastroesophageal reflux disease)   . Hearing loss   . Restless leg   . Constipation   . Tremor   . Swelling of limb     Leg Swelling  . Obese   . Dyslipidemia   . Gall bladder disease   . Cataract   . Carpal tunnel syndrome of left wrist     Past Surgical History   Procedure Laterality Date  . Appendectomy    . Cholecystectomy    . Abdominal hysterectomy    . Fracture surgery      X 4 LEFT ARM  . Benign cyst removed from kidney and lung, bilateral mastectomy    . ,bilateral great toenail removal    . Knee arthroscopy  2012    right knee, Dr Romeo Apple  . Left forearm    . Breast surgery      bilateral 2000approx  . Tonsillectomy    . R hand surgery    . Bronchoscopy  02/15/2000  . Right vats.  "  . Right upper lobe wedge resection.  "  . Upper gastrointestinal endoscopy  11/25/1999    Esophagitis/ Normal proximal esophagus, stomach and duodenum  . Colonoscopy  01/17/2009    Dr. Arlyce Dice : Internal hemorrhoids/Diverticula, scattered in the ascending colon/  Moderate diverticulosis ascending colon to sigmoid colon  . Esophagogastroduodenoscopy   04/23/2003    Dr. Arlyce Dice: HIATAL HERNIA  . Colonoscopy  01/16/2001    Normal  . Cataract extraction, bilateral    . Esophagogastroduodenoscopy  03/20/2012    WUJ:[id number] ring-LIKELY CAUSING MILD DYSPHAGIA/SMALL hiatal hernia/Multiple sessile polyps ranging between 3-75mm , path benign  . Cataract extraction    . Carpal tunnel release      left wrist    Current Outpatient Prescriptions  Medication Sig Dispense Refill  . ALPRAZolam (XANAX) 0.25 MG tablet Take 0.25  mg by mouth at bedtime as needed for sleep.      [name] amLODipine (NORVASC) 5 MG tablet Take 5 mg by mouth daily.      [name] amLODipine (NORVASC) 5 MG tablet TAKE 1 TABLET EVERY DAY  30 tablet  3  . CALCIUM-MAGNESIUM-ZINC PO Take 1 tablet by mouth daily.      . calcium-vitamin D (OSCAL WITH D) 500-200 MG-UNIT per tablet Take 1 tablet by mouth 2 (two) times daily.      . Cholecalciferol (VITAMIN D3) 5000 UNITS CAPS Take 1 capsule by mouth daily.      . Cranberry-Cholecalciferol 4200-500 MG-UNIT CAPS Take 1 capsule by mouth daily.      [name] docusate sodium (COLACE) 100 MG capsule Take 100 mg by mouth 3 (three) times daily as needed.      [name]  esomeprazole (NEXIUM) 40 MG capsule Take 1 capsule (40 mg total) by mouth daily before breakfast.  30 capsule  5  . ezetimibe-simvastatin (VYTORIN) 10-40 MG per tablet Take 1 tablet by mouth at bedtime.      . ferrous sulfate 325 (65 FE) MG EC tablet Take 325 mg by mouth daily.      [name] losartan-hydrochlorothiazide (HYZAAR) 50-12.5 MG per tablet Take 1 tablet by mouth daily.      [name] lubiprostone (AMITIZA) 8 MCG capsule Take 8 mcg by mouth 2 (two) times daily with a meal.      . metoprolol succinate (TOPROL-XL) 50 MG 24 hr tablet Take 50 mg by mouth daily. Take with or immediately following a meal.      . metoprolol succinate (TOPROL-XL) 50 MG 24 hr tablet TAKE 1 TABLET EVERY DAY  30 tablet  4  . niacin 500 MG tablet Take 500 mg by mouth daily.      [name] NITROSTAT 0.4 MG SL tablet Place 0.4 mg under the tongue every 5 (five) minutes as needed. For chest pain      . Olmesartan-Amlodipine-HCTZ (TRIBENZOR) 20-5-12.5 MG TABS Take 1 tablet by mouth daily.      . ondansetron (ZOFRAN) 4 MG tablet Take 1 or 2 po Q 6hrs for nausea or vomiting  10 tablet  0  . penicillin v potassium (VEETID) 500 MG tablet Take 1 tablet (500 mg total) by mouth 3 (three) times daily.  30 tablet  0  . Potassium 99 MG TABS Take 99 mg by mouth daily.       [name] topiramate (TOPAMAX) 25 MG tablet Take 25 mg by mouth 2 (two) times daily.      . traMADol (ULTRAM) 50 MG tablet Take 50 mg by mouth every 12 (twelve) hours as needed.       No current facility-administered medications for this visit.    Allergies as of 09/04/2012 - Review Complete 08/31/2012  Allergen Reaction Noted  . Codeine Hives   . Morphine and related Itching and Other (See Comments) 12/24/2010    Family History  Problem Relation Age of Onset  . Diabetes Mother   . Heart disease Mother   . Stroke Mother   . Cancer Sister     KIDNEY  . Emphysema Sister   . Heart disease Brother   . Heart disease Sister   . Emphysema Sister   . Cancer Sister     BREAST  .  Heart disease Brother   . Colon cancer Sister   . Alzheimer's disease Father   . Heart disease Sister   . Tremor Sister   . Tremor  Brother   . Diabetes Child   . Alcoholism Child     History   Social History  . Marital Status: Widowed    Spouse Name: N/A    Number of Children: N/A  . Years of Education: N/A   Social History Main Topics  . Smoking status: Never Smoker   . Smokeless tobacco: None  . Alcohol Use: No  . Drug Use: No  . Sexually Active: No   Other Topics Concern  . None   Social History Narrative  . None    Review of Systems: Negative unless mentioned in HPI  Physical Exam: BP 106/62  Pulse 89  Temp(Src) 98.2 F (36.8 C) (Oral)  Ht 5\' 6"  (1.676 m)  Wt 166 lb (75.297 kg)  BMI 26.81 kg/m2 General:   Alert and oriented. No distress noted. Pleasant and cooperative.  Head:  Normocephalic and atraumatic. Eyes:  Conjuctiva clear without scleral icterus. Mouth:  Oral mucosa pink and moist. Good dentition. No lesions. Heart:  S1, S2 present without murmurs, rubs, or gallops. Regular rate and rhythm. Abdomen:  +BS, soft, non-tender and non-distended. No rebound or guarding. No HSM or masses noted. Msk:  Symmetrical without gross deformities. Normal posture. Extremities:  Without edema. Neurologic:  Alert and  oriented x4;  grossly normal neurologically. Skin:  Intact without significant lesions or rashes. Psych:  Alert and cooperative. Normal mood and affect.

## 2012-09-04 NOTE — Patient Instructions (Addendum)
Continue taking Nexium daily.  We will see you back in 1 year or sooner if needed!

## 2012-09-05 DIAGNOSIS — K5900 Constipation, unspecified: Secondary | ICD-10-CM | POA: Insufficient documentation

## 2012-09-05 NOTE — Assessment & Plan Note (Addendum)
Miralax and Geritol without significant improvement. Trial Amitiza. [name] Next TCS due in 2015 due to family history of colon cancer (sister).  CT on file from Aug 2013 secondary to abdominal pain. Stable renal cyst noted.

## 2012-09-05 NOTE — Assessment & Plan Note (Signed)
GERD controlled with Nexium daily; pt notes recurrent symptoms with cessation. Continue Nexium daily. OV in 1 year.

## 2012-09-06 NOTE — Progress Notes (Signed)
Faxed to PCP

## 2012-10-12 ENCOUNTER — Encounter: Payer: Self-pay | Admitting: Neurology

## 2012-10-13 ENCOUNTER — Telehealth: Payer: Self-pay | Admitting: Neurology

## 2012-10-13 NOTE — Telephone Encounter (Signed)
The patient's insurance agent called to get a letter stating she doesn't have Parkinson's disease, he also faxed a request to the doctor.   I returned call to let agent know the letter has been written and I will fax letter to [facility] at [phone].

## 2012-10-16 ENCOUNTER — Encounter: Payer: Self-pay | Admitting: Neurology

## 2012-10-16 ENCOUNTER — Ambulatory Visit (INDEPENDENT_AMBULATORY_CARE_PROVIDER_SITE_OTHER): Payer: Medicare Other | Admitting: Neurology

## 2012-10-16 VITALS — BP 150/88 | HR 80 | Ht 65.0 in | Wt 164.0 lb

## 2012-10-16 DIAGNOSIS — R269 Unspecified abnormalities of gait and mobility: Secondary | ICD-10-CM

## 2012-10-16 DIAGNOSIS — R2681 Unsteadiness on feet: Secondary | ICD-10-CM

## 2012-10-16 DIAGNOSIS — I10 Essential (primary) hypertension: Secondary | ICD-10-CM

## 2012-10-16 MED ORDER — TOPIRAMATE 25 MG PO TABS: ORAL_TABLET | ORAL | Status: DC

## 2012-10-16 NOTE — Patient Instructions (Signed)
Tremor  Tremor is a rhythmic, involuntary muscular contraction characterized by oscillations (to-and-fro movements) of a part of the body. The most common of all involuntary movements, tremor can affect various body parts such as the hands, head, facial structures, vocal cords, trunk, and legs; most tremors, however, occur in the hands. Tremor often accompanies neurological disorders associated with aging. Although the disorder is not life-threatening, it can be responsible for functional disability and social embarrassment.  TREATMENT   There are many types of tremor and several ways in which tremor is classified. The most common classification is by behavioral context or position. There are five categories of tremor within this classification: resting, postural, kinetic, task-specific, and psychogenic. Resting or static tremor occurs when the muscle is at rest, for example when the hands are lying on the lap. This type of tremor is often seen in patients with Parkinson's disease. Postural tremor occurs when a patient attempts to maintain posture, such as holding the hands outstretched. Postural tremors include physiological tremor, essential tremor, tremor with basal ganglia disease (also seen in patients with Parkinson's disease), cerebellar postural tremor, tremor with peripheral neuropathy, post-traumatic tremor, and alcoholic tremor. Kinetic or intention (action) tremor occurs during purposeful movement, for example during finger-to-nose testing. Task-specific tremor appears when performing goal-oriented tasks such as handwriting, speaking, or standing. This group consists of primary writing tremor, vocal tremor, and orthostatic tremor. Psychogenic tremor occurs in both older and younger patients. The key feature of this tremor is that it dramatically lessens or disappears when the patient is distracted.  PROGNOSIS  There are some treatment options available for tremor; the appropriate treatment depends on  accurate diagnosis of the cause. Some tremors respond to treatment of the underlying condition, for example in some cases of psychogenic tremor treating the patient's underlying mental problem may cause the tremor to disappear. Also, patients with tremor due to Parkinson's disease may be treated with Levodopa drug therapy. Symptomatic drug therapy is available for several other tremors as well. For those cases of tremor in which there is no effective drug treatment, physical measures such as teaching the patient to brace the affected limb during the tremor are sometimes useful. Surgical intervention such as thalamotomy or deep brain stimulation may be useful in certain cases.  Document Released: 06/18/2002 Document Revised: 09/20/2011 Document Reviewed: 06/28/2005  [employer] Patient Information 2013 [employer].

## 2012-10-16 NOTE — Progress Notes (Signed)
Reason for visit: Tremor  Erin [name] is an 79 y.o. female  History of present illness:  Erin [name] is an 79 year old right-handed white female with a history of a benign essential tremor. The patient has had ongoing problems with gait imbalance, and she recently had a fall 3 days ago. The patient indicates that she is not using a cane on a regular basis, but she has a cane at home. The patient in the past has undergone physical therapy with good improvement in the balance issues, but she never continued the balance exercises following therapy. The patient has had a peripheral neuropathy diagnosed by nerve conduction study. This is in part may be a source of her gait imbalance. Blood work for the neuropathy was unremarkable. The patient indicates that the tremor has worsened gradually over time. The patient returns to this office for an evaluation.  Past Medical History  Diagnosis Date  . Anxiety   . Arthritis   . Hyperlipidemia   . Hypertension   . Osteoporosis   . GERD (gastroesophageal reflux disease)   . Hearing loss   . Restless leg   . Constipation   . Tremor   . Swelling of limb     Leg Swelling  . Obese   . Dyslipidemia   . Gall bladder disease   . Cataract   . Carpal tunnel syndrome of left wrist     Past Surgical History  Procedure Laterality Date  . Appendectomy    . Cholecystectomy    . Abdominal hysterectomy    . Fracture surgery      X 4 LEFT ARM  . Benign cyst removed from kidney and lung, bilateral mastectomy    . ,bilateral great toenail removal    . Knee arthroscopy  2012    right knee, Dr Romeo Apple  . Left forearm    . Breast surgery      bilateral 2000approx  . Tonsillectomy    . R hand surgery    . Bronchoscopy  02/15/2000  . Right vats.  "  . Right upper lobe wedge resection.  "  . Upper gastrointestinal endoscopy  11/25/1999    Esophagitis/ Normal proximal esophagus, stomach and duodenum  . Colonoscopy  01/17/2009    Dr. Arlyce Dice :  Internal hemorrhoids/Diverticula, scattered in the ascending colon/  Moderate diverticulosis ascending colon to sigmoid colon  . Esophagogastroduodenoscopy   04/23/2003    Dr. Arlyce Dice: HIATAL HERNIA  . Colonoscopy  01/16/2001    Normal  . Cataract extraction, bilateral    . Esophagogastroduodenoscopy  03/20/2012    HYQ:MVHQIONG ring-LIKELY CAUSING MILD DYSPHAGIA/SMALL hiatal hernia/Multiple sessile polyps ranging between 3-42mm , path benign  . Cataract extraction    . Carpal tunnel release      left wrist    Family History  Problem Relation Age of Onset  . Diabetes Mother   . Heart disease Mother   . Stroke Mother   . Cancer Sister     KIDNEY  . Emphysema Sister   . Heart disease Brother   . Heart disease Sister   . Emphysema Sister   . Cancer Sister     BREAST  . Heart disease Brother   . Colon cancer Sister   . Alzheimer's disease Father   . Heart disease Sister   . Tremor Sister   . Tremor Brother   . Diabetes Child   . Alcoholism Child   . Cancer Brother     Social history:  reports  that she has never smoked. She does not have any smokeless tobacco history on file. She reports that she does not drink alcohol or use illicit drugs.  Allergies:  Allergies  Allergen Reactions  . Codeine Hives  . Morphine And Related Itching and Other (See Comments)    Makes pt hallucinate    Medications:  Current Outpatient Prescriptions on File Prior to Visit  Medication Sig Dispense Refill  . amLODipine (NORVASC) 5 MG tablet TAKE 1 TABLET EVERY DAY  30 tablet  3  . CALCIUM-MAGNESIUM-ZINC PO Take 1 tablet by mouth daily.      . calcium-vitamin D (OSCAL WITH D) 500-200 MG-UNIT per tablet Take 1 tablet by mouth 2 (two) times daily.      . Cranberry-Cholecalciferol 4200-500 MG-UNIT CAPS Take 1 capsule by mouth daily.      [name] esomeprazole (NEXIUM) 40 MG capsule Take 1 capsule (40 mg total) by mouth daily before breakfast.  30 capsule  5  . ezetimibe-simvastatin (VYTORIN) 10-40 MG per  tablet Take 1 tablet by mouth at bedtime.      . ferrous sulfate 325 (65 FE) MG EC tablet Take 325 mg by mouth daily.      [name] losartan-hydrochlorothiazide (HYZAAR) 50-12.5 MG per tablet Take 1 tablet by mouth daily.      [name] lubiprostone (AMITIZA) 8 MCG capsule Take 8 mcg by mouth 2 (two) times daily with a meal.      . metoprolol succinate (TOPROL-XL) 50 MG 24 hr tablet TAKE 1 TABLET EVERY DAY  30 tablet  4  . niacin 500 MG tablet Take 500 mg by mouth daily.      . Potassium 99 MG TABS Take 99 mg by mouth daily.        No current facility-administered medications on file prior to visit.    ROS:  Out of a complete 14 system review of symptoms, the patient complains only of the following symptoms, and all other reviewed systems are negative.  Swelling in the legs Hearing loss Constipation Muscle cramps Tremor  Blood pressure 150/88, pulse 80, height 5\' 5"  (1.651 m), weight 164 lb (74.39 kg).  Physical Exam  General: The patient is alert and cooperative at the time of the examination.  Skin: No significant peripheral edema is noted.   Neurologic Exam  Cranial nerves: Facial symmetry is present. Speech is normal, no aphasia or dysarthria is noted. Extraocular movements are full. Visual fields are full.  Motor: The patient has good strength in all 4 extremities.  Coordination: The patient has good finger-nose-finger and heel-to-shin bilaterally.  Gait and station: The patient has a slightly wide-based gait. Tandem gait is unsteady. Romberg is negative. No drift is seen.  Reflexes: Deep tendon reflexes are symmetric.   Assessment/Plan:  1. Benign essential tremor  2. Gait disorder  3. Peripheral neuropathy  The patient will be sent back for physical therapy for gait training. The patient will be increased slightly on the Topamax taking 25 mg in the morning, and 50 mg in the evening. The patient will need to watch out for cognitive side effects. The patient indicates that in  the past, the use of diazepam has helped her tremor, but this patient is a fall risk, and diazepam may worsen this. The patient will otherwise followup in 6 months.  Marlan Palau MD 10/16/2012 8:48 PM  [facility] [address] [place], [place] [address]  Phone [phone] Fax [phone]

## 2012-11-03 ENCOUNTER — Ambulatory Visit: Payer: Medicare Other | Admitting: Family Medicine

## 2012-11-13 ENCOUNTER — Encounter: Payer: Self-pay | Admitting: Family Medicine

## 2012-11-13 ENCOUNTER — Ambulatory Visit (INDEPENDENT_AMBULATORY_CARE_PROVIDER_SITE_OTHER): Payer: Medicare Other | Admitting: Family Medicine

## 2012-11-13 VITALS — BP 130/70 | HR 72 | Resp 16 | Ht 69.0 in | Wt 165.1 lb

## 2012-11-13 DIAGNOSIS — I10 Essential (primary) hypertension: Secondary | ICD-10-CM

## 2012-11-13 DIAGNOSIS — K219 Gastro-esophageal reflux disease without esophagitis: Secondary | ICD-10-CM

## 2012-11-13 DIAGNOSIS — E785 Hyperlipidemia, unspecified: Secondary | ICD-10-CM

## 2012-11-13 DIAGNOSIS — R7309 Other abnormal glucose: Secondary | ICD-10-CM

## 2012-11-13 DIAGNOSIS — M25512 Pain in left shoulder: Secondary | ICD-10-CM | POA: Insufficient documentation

## 2012-11-13 DIAGNOSIS — R7303 Prediabetes: Secondary | ICD-10-CM

## 2012-11-13 DIAGNOSIS — M25519 Pain in unspecified shoulder: Secondary | ICD-10-CM

## 2012-11-13 DIAGNOSIS — R7301 Impaired fasting glucose: Secondary | ICD-10-CM

## 2012-11-13 MED ORDER — TRAMADOL HCL 50 MG PO TABS: ORAL_TABLET | ORAL | Status: AC

## 2012-11-13 NOTE — Patient Instructions (Addendum)
F/u in 3 month for annual wellness, call if you need me before  PLEASE start alleve one daily and tylenol 500mg  one at night along with tramadol 50 mg one at night for arthritis pain until you start feeling better. You are referred for physical therapy as well as to see Dr Romeo Apple re left shoulder pain   Please get HBA1C and chem 7 non fasting in 3 month, before next visit.  HAPPY BIRTHDAY! When it comes on Saturday

## 2012-11-13 NOTE — Progress Notes (Signed)
  Subjective:    Patient ID: Erin [name], female    DOB: [birth year], 79 y.o.   MRN: [id number]  HPI The PT is here for follow up and re-evaluation of chronic medical conditions, medication management and review of any available recent lab and radiology data.  Preventive health is updated, specifically  Cancer screening and Immunization.   Questions or concerns regarding consultations or procedures which the PT has had in the interim are  Addressed.Was evaluated by neurology since last visit, no new meds. The PT denies any adverse reactions to current medications since the last visit.  C/o increased and uncontrolled pain in shoulder and back, requests meidcation for this also ortho eval Reports improved strength and energy since las 2 visits     Review of Systems See HPI Denies recent fever or chills. Denies sinus pressure, nasal congestion, ear pain or sore throat. Denies chest congestion, productive cough or wheezing. Denies chest pains, palpitations and leg swelling Denies abdominal pain, nausea, vomiting,diarrhea or constipation.   Denies dysuria, frequency, hesitancy or incontinence.  Denies headaches, seizures, numbness, or tingling. Denies depression, anxiety or insomnia. Denies skin break down or rash.        Objective:   Physical Exam  Patient alert and oriented and in no cardiopulmonary distress.  HEENT: No facial asymmetry, EOMI, no sinus tenderness,  oropharynx pink and moist.  Neck supple no adenopathy.  Chest: Clear to auscultation bilaterally.  CVS: S1, S2 no murmurs, no S3.  ABD: Soft non tender. Bowel sounds normal.  Ext: No edema  MS: decreased  ROM spine,and  shoulders, hips and knees.  Skin: Intact, no ulcerations or rash noted.  Psych: Good eye contact, normal affect. Memory intact not anxious or depressed appearing.  CNS: CN 2-12 intact, power, tone and sensation normal throughout.       Assessment & Plan:

## 2012-11-24 ENCOUNTER — Ambulatory Visit (HOSPITAL_COMMUNITY)
Admission: RE | Admit: 2012-11-24 | Discharge: 2012-11-24 | Disposition: A | Discharge status: Home / Self Care | Payer: Medicare Other | Source: Ambulatory Visit | Attending: Family Medicine | Admitting: Family Medicine

## 2012-11-24 DIAGNOSIS — IMO0001 Reserved for inherently not codable concepts without codable children: Secondary | ICD-10-CM | POA: Insufficient documentation

## 2012-11-24 DIAGNOSIS — M6281 Muscle weakness (generalized): Secondary | ICD-10-CM | POA: Insufficient documentation

## 2012-11-24 DIAGNOSIS — M25519 Pain in unspecified shoulder: Secondary | ICD-10-CM | POA: Insufficient documentation

## 2012-11-24 NOTE — Evaluation (Signed)
Occupational Therapy Evaluation  Patient Details  Name: Erin [name] MRN: [id number] Date of Birth: 1929-08-27  Today's Date: 11/24/2012 Time: 1520-1606 OT Time Calculation (min): 46 min OT eval 1520-1606 46'  Visit#: 1 of 12  Re-eval: 12/22/12  Assessment Diagnosis: Lt shoulder pain Next MD Visit: Romeo Apple - 11/30/12  Authorization: medicare  Authorization Time Period: before 10th visit  Authorization Visit#: 1 of 10   Past Medical History:  Past Medical History  Diagnosis Date  . Anxiety   . Arthritis   . Hyperlipidemia   . Hypertension   . Osteoporosis   . GERD (gastroesophageal reflux disease)   . Hearing loss   . Restless leg   . Constipation   . Tremor   . Swelling of limb     Leg Swelling  . Obese   . Dyslipidemia   . Gall bladder disease   . Cataract   . Carpal tunnel syndrome of left wrist    Past Surgical History:  Past Surgical History  Procedure Laterality Date  . Appendectomy    . Cholecystectomy    . Abdominal hysterectomy    . Fracture surgery      X 4 LEFT ARM  . Benign cyst removed from kidney and lung, bilateral mastectomy    . ,bilateral great toenail removal    . Knee arthroscopy  2012    right knee, Dr Romeo Apple  . Left forearm    . Breast surgery      bilateral 2000approx  . Tonsillectomy    . R hand surgery    . Bronchoscopy  02/15/2000  . Right vats.  "  . Right upper lobe wedge resection.  "  . Upper gastrointestinal endoscopy  11/25/1999    Esophagitis/ Normal proximal esophagus, stomach and duodenum  . Colonoscopy  01/17/2009    Dr. Arlyce Dice : Internal hemorrhoids/Diverticula, scattered in the ascending colon/  Moderate diverticulosis ascending colon to sigmoid colon  . Esophagogastroduodenoscopy   04/23/2003    Dr. Arlyce Dice: HIATAL HERNIA  . Colonoscopy  01/16/2001    Normal  . Cataract extraction, bilateral    . Esophagogastroduodenoscopy  03/20/2012    HYQ:MVHQIONG ring-LIKELY CAUSING MILD DYSPHAGIA/SMALL hiatal  hernia/Multiple sessile polyps ranging between 3-43mm , path benign  . Cataract extraction    . Carpal tunnel release      left wrist    Subjective Symptoms/Limitations Symptoms: S: Once the pain starts i can't do anything at all. I'll get pain in the back of my left shoulder and it'll be right in between my shoulder blades at well.  Pertinent History: Miss Senteno recently stayed with her sister for a week and slept in the recliner. Ever since that time, she has experienced pain in her left shoulder and thoracic back region which has interfered with her daily and leisure activities. Dr. Lodema Hong has referred patient for occupational therapy evaluation and treatment.  Limitations: Patient has broken Bil wrists, broken Right shoulder, sprained Lt shoulder in the past which has given her mobility deficits prior to the present problem. Patient Stated Goals: To get rid of the pain. Pain Assessment Currently in Pain?: No/denies  Precautions/Restrictions  Precautions Precautions: None  Balance Screening Balance Screen Has the patient fallen in the past 6 months: Yes How many times?: 1 (slipped on a rug in the carport) Has the patient had a decrease in activity level because of a fear of falling? : Yes Is the patient reluctant to leave their home because of a fear of falling? :  Yes  Prior Functioning  Home Living Lives With: Son (he's in and out) Prior Function Level of Independence: Independent with basic ADLs;Independent with gait Able to Take Stairs?: Yes Driving: Yes Vocation: Retired Gaffer: worked [employer] Leisure: Hobbies-yes (Comment) Comments: talking, cross stitch, eating out  Assessment ADL/Vision/Perception ADL ADL Comments: Pain intereferes with driving, cooking, and walking Dominant Hand: Right Vision - History Baseline Vision: No visual deficits  Cognition/Observation Cognition Overall Cognitive Status: Within Functional Limits for tasks  assessed Arousal/Alertness: Awake/alert Orientation Level: Oriented X4   Additional Assessments RUE AROM (degrees) Right Shoulder Flexion: 115 Degrees Right Shoulder ABduction: 121 Degrees Right Shoulder Internal Rotation: 90 Degrees Right Shoulder External Rotation: 75 Degrees RUE Strength Right Shoulder Flexion: 5/5 Right Shoulder ABduction: 5/5 Right Shoulder Internal Rotation: 5/5 Right Shoulder External Rotation: 5/5 LUE AROM (degrees) Left Shoulder Flexion: 130 Degrees Left Shoulder ABduction: 107 Degrees Left Shoulder Internal Rotation: 90 Degrees Left Shoulder External Rotation: 54 Degrees LUE Strength Left Shoulder Flexion:  (4-/5) Left Shoulder ABduction:  (4-/5) Left Shoulder Internal Rotation: 4/5 Left Shoulder External Rotation: 4/5 Cervical Assessment Cervical Assessment:  (Cervical ROM extremely restricted.) Palpation Palpation: max fascial restrictions in cervical region. Tenderness in trapezius region.            Occupational Therapy Assessment and Plan OT Assessment and Plan Clinical Impression Statement: A: Patient presents with increased fasical restrictions in left shoulder and cervical region, joint instablity, and increased pain, which is causing decreased independence with daily activities due to decreased functional use of her left arm.  Pt will benefit from skilled therapeutic intervention in order to improve on the following deficits: Increased fascial restricitons;Decreased range of motion;Pain;Decreased strength Rehab Potential: Good OT Frequency: Min 2X/week OT Duration: 6 weeks OT Treatment/Interventions: Self-care/ADL training;Therapeutic activities;Therapeutic exercise;Manual therapy;Modalities;Patient/family education OT Plan:  P:  Skilled OT intervention to decrease pain, restrictions, and joint instability while improving AROM and strength needed to return to full use of LUE with all daily activities.  Treatment Plan:  MFR and manual  stretching to left trapezius, SCM, rhomboid, and cervical region to decrease pain and fascial restrictions, gentle cervical traction and mobilzations Therapeutic exercises to build proximal shoulder stability, x to v and w arms, tband for shoulder stability, UBE in reverse.  AVOID combining ER/IR and abduction.     Goals Short Term Goals Time to Complete Short Term Goals: 3 weeks Short Term Goal 1: Patient will be educated on HEP. Short Term Goal 2: Patient will decrease her right shoulder and cervical pain to 5/10 when cooking a meal. Short Term Goal 3: Patient will decrease fascial restrictions to mod in her right shoulder and cervical region. Short Term Goal 4: Patient will improve right shoulder joint stability from fair to good - for increased ability to maintain correct posture when walking with less difficulty. Short Term Goal 5: Patient will improve cervical ROM to [facility] to be able to drive with increased comfort without needing to get out of car due to increased pain.  Long Term Goals Time to Complete Long Term Goals: 6 weeks Long Term Goal 1: Patient will return to prior level of independence with all daily and leisure activities. Long Term Goal 2: Patient will decrease her right shoulder and cervical pain to 3/10 when cooking a meal. Long Term Goal 3: Patient will decrease fascial restrictions to min in her right shoulder and cervical region. Long Term Goal 4: Patient will improve right shoulder joint stability from good- to good for increased ability  to maintain correct posture when walking with less difficulty. Long Term Goal 5: Patient will increase cervical ROM to WNL to be able to turn head and scan left and right for traffic with increased comfort.  Problem List Patient Active Problem List   Diagnosis Date Noted  . Muscle weakness (generalized) 11/24/2012  . Left shoulder pain 11/13/2012  . Unspecified constipation 09/05/2012  . H/O falling 08/01/2012  . Bronchitis 07/31/2012   . Routine general medical examination at a health care facility 04/16/2012  . FH: colon cancer 02/23/2012  . RLQ abdominal pain 02/17/2012  . Abdominal pain 02/01/2012  . Rib pain on right side 02/01/2012  . Memory loss 11/08/2011  . Other general symptoms 11/08/2011  . Other vitamin B12 deficiency anemia 11/08/2011  . Essential and other specified forms of tremor 11/08/2011  . Hyperlipidemia 10/31/2011  . Unsteady gait 10/14/2011  . Ankle pain, right 10/14/2011  . Leg swelling 10/14/2011  . Osteoporosis   . Other and unspecified hyperlipidemia 03/16/2011  . Prediabetes 03/16/2011  . BURSITIS, RIGHT KNEE 01/05/2010  . SPINAL STENOSIS, LUMBAR 11/20/2009  . SCIATICA 11/13/2009  . OSTEOARTHRITIS, KNEE, RIGHT 09/11/2009  . UNSTEADY GAIT 09/11/2009  . MEDIAL MENISCUS TEAR, RIGHT 09/11/2009  . ESSENTIAL HYPERTENSION, BENIGN 01/02/2009  . GERD 01/02/2009  . HEMORRHAGE OF RECTUM AND ANUS 01/02/2009  . PAIN IN JOINT, HAND 11/20/2008  . Pain in joint, pelvic region and thigh 11/20/2008  . KNEE PAIN 11/20/2008  . ARTHRITIS, LEFT FOOT 07/17/2007    End of Session Activity Tolerance: Patient tolerated treatment well General Behavior During Therapy: WFL for tasks assessed/performed Cognition: WFL for tasks performed OT Plan of Care OT Home Exercise Plan: cervical and table stretches OT Patient Instructions: handout Consulted and Agree with Plan of Care: Patient  GO Functional Assessment Tool Used: DASH score of 77.2 with ideal score of 0 Functional Limitation: Self care Self Care Current Status (W0981): At least 60 percent but less than 80 percent impaired, limited or restricted Self Care Goal Status (X9147): At least 20 percent but less than 40 percent impaired, limited or restricted  Limmie Patricia, OTR/L,CBIS   11/24/2012, 4:33 PM  Physician Documentation Your signature is required to indicate approval of the treatment plan as stated above.  Please sign and either send  electronically or make a copy of this report for your files and return this physician signed original.  Please mark one 1.__approve of plan  2. ___approve of plan with the following conditions.   ______________________________                                                          _____________________ Physician Signature                                                                                                             Date

## 2012-11-26 ENCOUNTER — Telehealth: Payer: Self-pay | Admitting: Family Medicine

## 2012-11-26 DIAGNOSIS — E785 Hyperlipidemia, unspecified: Secondary | ICD-10-CM

## 2012-11-26 NOTE — Assessment & Plan Note (Signed)
Controlled, no change in medication  

## 2012-11-26 NOTE — Assessment & Plan Note (Signed)
Deteriorated, HBA1C up to 6.3 Patient educated about the importance of limiting  Carbohydrate intake , the need to commit to daily physical activity for a minimum of 30 minutes , and to commit weight loss. The fact that changes in all these areas will reduce or eliminate all together the development of diabetes is stressed.

## 2012-11-26 NOTE — Assessment & Plan Note (Signed)
Updated lab needed Hyperlipidemia:Low fat diet discussed and encouraged.   

## 2012-11-26 NOTE — Telephone Encounter (Signed)
Pl contact pt , let her know next lab in 3 months is FASTING , I had said non fast, but she needs lipoid checked is on meidcation and has not had lipid since 09/2011, pls add lipid to lab request fasting

## 2012-11-26 NOTE — Assessment & Plan Note (Signed)
Uncontrolled, refer PT as well as to orhtopedics. Start medication for pain

## 2012-11-27 ENCOUNTER — Ambulatory Visit (HOSPITAL_COMMUNITY)
Admission: RE | Admit: 2012-11-27 | Discharge: 2012-11-27 | Disposition: A | Discharge status: Home / Self Care | Payer: Medicare Other | Source: Ambulatory Visit | Attending: Family Medicine | Admitting: Family Medicine

## 2012-11-27 NOTE — Addendum Note (Signed)
Addended by: Abner Greenspan on: 11/27/2012 08:45 AM   Modules accepted: Orders

## 2012-11-27 NOTE — Telephone Encounter (Signed)
Lab order mailed.

## 2012-11-27 NOTE — Progress Notes (Signed)
Occupational Therapy Treatment Patient Details  Name: Erin [name] MRN: [id number] Date of Birth: [birth year]  Today's Date: 11/27/2012 Time: 4098-1191 OT Time Calculation (min): 37 min Manual Therapy 1353-1410 17' Therapeutic Exercises 1410-1430 20' Visit#: 2 of 12  Re-eval: 12/22/12    Authorization: medicare  Authorization Time Period: before 10th visit  Authorization Visit#: 2 of 10  Subjective Symptoms/Limitations Symptoms: S:  I wonder if my back is causing my neck pain.  Limitations:  AVOID combining ER/IR and abduction.     Pain Assessment Currently in Pain?: Yes Pain Score:   7 Pain Location: Back Pain Orientation: Lower Pain Type: Chronic pain  Precautions/Restrictions     Exercise/Treatments Supine Protraction: PROM;AAROM;10 reps Horizontal ABduction: PROM;AAROM External Rotation: PROM;AAROM Internal Rotation: PROM;AAROM Flexion: PROM;AAROM ABduction: PROM;AAROM Seated Elevation: AROM;10 reps Extension: AROM;10 reps Row: AROM;10 reps Therapy Ball Flexion: 10 reps ABduction: 10 reps   Seated Exercises Cervical Rotation: Right;Left;10 reps Lateral Flexion: Left;Right;10 reps Other Seated Exercise: cervical flexion extension 10X each    Manual Therapy Manual Therapy: Myofascial release Myofascial Release: Manual cervical traction, MFR to bilateral trapezius, SCM, rhomboid region, left scapular regions to decrease pain and fascial restrictions and increase pain free mobility.   Occupational Therapy Assessment and Plan OT Assessment and Plan Clinical Impression Statement: A:  Patient able to rotate to left 80% or better after MFR to left cervical and scapular region.  Patient amazed at the increase in mobility after one manual therapy session! OT Plan: P:  Add scapular stabilty exercises, follow up on cervical AROM HEP compliance.    Goals Short Term Goals Time to Complete Short Term Goals: 3 weeks Short Term Goal 1: Patient will be educated on  HEP. Short Term Goal 1 Progress: Progressing toward goal Short Term Goal 2: Patient will decrease her right shoulder and cervical pain to 5/10 when cooking a meal. Short Term Goal 2 Progress: Progressing toward goal Short Term Goal 3: Patient will decrease fascial restrictions to mod in her right shoulder and cervical region. Short Term Goal 3 Progress: Progressing toward goal Short Term Goal 4: Patient will improve right shoulder joint stability from fair to good - for increased ability to maintain correct posture when walking with less difficulty. Short Term Goal 4 Progress: Progressing toward goal Short Term Goal 5: Patient will improve cervical ROM to [facility] to be able to drive with increased comfort without needing to get out of car due to increased pain.  Short Term Goal 5 Progress: Progressing toward goal Long Term Goals Time to Complete Long Term Goals: 6 weeks Long Term Goal 1: Patient will return to prior level of independence with all daily and leisure activities. Long Term Goal 1 Progress: Progressing toward goal Long Term Goal 2: Patient will decrease her right shoulder and cervical pain to 3/10 when cooking a meal. Long Term Goal 2 Progress: Progressing toward goal Long Term Goal 3: Patient will decrease fascial restrictions to min in her right shoulder and cervical region. Long Term Goal 3 Progress: Progressing toward goal Long Term Goal 4: Patient will improve right shoulder joint stability from good- to good for increased ability to maintain correct posture when walking with less difficulty. Long Term Goal 4 Progress: Progressing toward goal Long Term Goal 5: Patient will increase cervical ROM to WNL to be able to turn head and scan left and right for traffic with increased comfort. Long Term Goal 5 Progress: Progressing toward goal  Problem List Patient Active Problem List   Diagnosis  Date Noted  . Muscle weakness (generalized) 11/24/2012  . Left shoulder pain 11/13/2012  .  Unspecified constipation 09/05/2012  . H/O falling 08/01/2012  . Bronchitis 07/31/2012  . Routine general medical examination at a health care facility 04/16/2012  . FH: colon cancer 02/23/2012  . RLQ abdominal pain 02/17/2012  . Abdominal pain 02/01/2012  . Rib pain on right side 02/01/2012  . Memory loss 11/08/2011  . Other general symptoms 11/08/2011  . Other vitamin B12 deficiency anemia 11/08/2011  . Essential and other specified forms of tremor 11/08/2011  . Hyperlipidemia 10/31/2011  . Unsteady gait 10/14/2011  . Ankle pain, right 10/14/2011  . Leg swelling 10/14/2011  . Osteoporosis   . Other and unspecified hyperlipidemia 03/16/2011  . Prediabetes 03/16/2011  . BURSITIS, RIGHT KNEE 01/05/2010  . SPINAL STENOSIS, LUMBAR 11/20/2009  . SCIATICA 11/13/2009  . OSTEOARTHRITIS, KNEE, RIGHT 09/11/2009  . UNSTEADY GAIT 09/11/2009  . MEDIAL MENISCUS TEAR, RIGHT 09/11/2009  . ESSENTIAL HYPERTENSION, BENIGN 01/02/2009  . GERD 01/02/2009  . HEMORRHAGE OF RECTUM AND ANUS 01/02/2009  . PAIN IN JOINT, HAND 11/20/2008  . Pain in joint, pelvic region and thigh 11/20/2008  . KNEE PAIN 11/20/2008  . ARTHRITIS, LEFT FOOT 07/17/2007    End of Session Activity Tolerance: Patient tolerated treatment well General Behavior During Therapy: [facility] for tasks assessed/performed Cognition: WFL for tasks performed  Shirlean Mylar, OTR/L  11/27/2012, 2:53 PM

## 2012-11-30 ENCOUNTER — Ambulatory Visit (INDEPENDENT_AMBULATORY_CARE_PROVIDER_SITE_OTHER): Payer: Medicare Other | Admitting: Orthopedic Surgery

## 2012-11-30 ENCOUNTER — Encounter: Payer: Self-pay | Admitting: Orthopedic Surgery

## 2012-11-30 ENCOUNTER — Ambulatory Visit (INDEPENDENT_AMBULATORY_CARE_PROVIDER_SITE_OTHER): Payer: Medicare Other

## 2012-11-30 VITALS — BP 130/64 | Ht 66.0 in | Wt 161.0 lb

## 2012-11-30 DIAGNOSIS — M25519 Pain in unspecified shoulder: Secondary | ICD-10-CM

## 2012-11-30 DIAGNOSIS — M546 Pain in thoracic spine: Secondary | ICD-10-CM

## 2012-11-30 DIAGNOSIS — M949 Disorder of cartilage, unspecified: Secondary | ICD-10-CM

## 2012-11-30 DIAGNOSIS — M25512 Pain in left shoulder: Secondary | ICD-10-CM

## 2012-11-30 DIAGNOSIS — M899 Disorder of bone, unspecified: Secondary | ICD-10-CM

## 2012-11-30 DIAGNOSIS — M898X1 Other specified disorders of bone, shoulder: Secondary | ICD-10-CM

## 2012-11-30 NOTE — Patient Instructions (Addendum)
I ordered a brace for your back please wear that for 6 weeks or until you're back pain is resolved  It is a good idea for you to start some physical therapy were continued physical therapy for neck and shoulder pain  This can be started at the hospital at your convenience

## 2012-11-30 NOTE — Progress Notes (Signed)
Patient ID: Erin [name], female   DOB: Mar 10, 1930, 79 y.o.   MRN: [id number] Chief Complaint  Patient presents with  . Shoulder Pain    Left shoulder pain d/t injury 6 weeks ago    History the patient had an injury to her left shoulder complains of some periscapular pain and lower thoracic pain with inability to sleep at night and some pain along the left shoulder. She went for some physical therapy and some of her pain resolved. She was having some difficulty with her neck at that time.  Examination reveals well-developed well-nourished female grooming and hygiene are intact she is kyphotic deformity. She is oriented x3 mood and affect are normal vital signs are stable BP 130/64  Ht 5\' 6"  (1.676 m)  Wt 161 lb (73.029 kg)  BMI 26 kg/m2 This a kyphotic thoracic spine tenderness at the base of the thoracic spine tenderness in the medial parascapular region she has normal passive motion of the left shoulder without pain and has negative impingement sign negative drop test stable shoulder motor exam is normal against resistance skin is intact there is no swelling she has good distal pulse and normal sensation. She also has neck deformity with  characteristic hyperextension secondary to kyphosis  Shoulder x-rays show old fracture no new fracture. On the lateral view she can see significant kyphosis but no compression fractures.  My impression is that she may have some mild impingement. She has thoracic kyphosis with possible stress fracture or osteoporotic fracture. She is on vitamin D and calcium  Recommend cash brace to control back pain  Physical therapy for shoulder pain.  Over the counter analgesics as needed.

## 2012-12-07 ENCOUNTER — Ambulatory Visit (HOSPITAL_COMMUNITY)
Admission: RE | Admit: 2012-12-07 | Discharge: 2012-12-07 | Disposition: A | Discharge status: Home / Self Care | Payer: Medicare Other | Source: Ambulatory Visit | Attending: Family Medicine | Admitting: Family Medicine

## 2012-12-07 DIAGNOSIS — M6281 Muscle weakness (generalized): Secondary | ICD-10-CM

## 2012-12-07 DIAGNOSIS — M25512 Pain in left shoulder: Secondary | ICD-10-CM

## 2012-12-07 NOTE — Progress Notes (Signed)
Occupational Therapy Treatment Patient Details  Name: Erin [name] MRN: [id number] Date of Birth: March 28, 1930  Today's Date: 12/07/2012 Time: 6295-2841 OT Time Calculation (min): 36 min Manual Therapy 3244-0102 22' Therapeutic Exercises [phone] 14' Visit#: 3 of 12  Re-eval: 12/22/12    Authorization: medicare  Authorization Time Period: before 10th visit  Authorization Visit#: 3 of 10  Subjective Symptoms/Limitations Symptoms: S:  I went to Dr. Romeo Apple and he wants me to have therapy on my shoulder and neck. (Therapy order recieved, however, duplicates Dr. Lodema Hong order, will continue treatment and notify both MDs.) Limitations:  AVOID combining ER/IR and abduction.     Pain Assessment Currently in Pain?: No/denies  Precautions/Restrictions    AVOID combining ER/IR and abduction.     Exercise/Treatments Supine Protraction: PROM;10 reps Horizontal ABduction: PROM;10 reps External Rotation: PROM;10 reps Internal Rotation: PROM;10 reps Flexion: PROM;10 reps ABduction: PROM;10 reps Seated Elevation: AROM;10 reps Extension: AROM;10 reps Row: AROM;10 reps Therapy Ball Flexion: 15 reps ABduction: 15 reps  Seated Exercises Cervical Rotation: Right;Left;10 reps Lateral Flexion: Left;Right;10 reps Other Seated Exercise: cervical flexion extension 10X each    Manual Therapy Manual Therapy: Myofascial release Myofascial Release: Manual cervical traction, MFR to bilateral trapezius, SCM, rhomboid region, platysmus, left scapular regions to decrease pain and fascial restrictions and increase pain free mobility.   Occupational Therapy Assessment and Plan OT Assessment and Plan Clinical Impression Statement: A:  Patient continues to demonstrate improvement with cervical AROM and left shoulder AROM, as well as decreased pain.  Patient seen by Dr. Romeo Apple, who sent an additional order for therapy for patient's left shoulder, including iontophoresis and TENS unit  distribution. OT Treatment/Interventions:  (ionotphoresis and TENS unit) OT Plan: P:  Add scapular stability exercises in seated:  UBE in reverse, thumbtacks, prot/ret//elev/dep.   Goals Short Term Goals Time to Complete Short Term Goals: 3 weeks Short Term Goal 1: Patient will be educated on HEP. Short Term Goal 1 Progress: Progressing toward goal Short Term Goal 2: Patient will decrease her right shoulder and cervical pain to 5/10 when cooking a meal. Short Term Goal 2 Progress: Progressing toward goal Short Term Goal 3: Patient will decrease fascial restrictions to mod in her right shoulder and cervical region. Short Term Goal 3 Progress: Progressing toward goal Short Term Goal 4: Patient will improve right shoulder joint stability from fair to good - for increased ability to maintain correct posture when walking with less difficulty. Short Term Goal 4 Progress: Progressing toward goal Short Term Goal 5: Patient will improve cervical ROM to [facility] to be able to drive with increased comfort without needing to get out of car due to increased pain.  Short Term Goal 5 Progress: Progressing toward goal Long Term Goals Time to Complete Long Term Goals: 6 weeks Long Term Goal 1: Patient will return to prior level of independence with all daily and leisure activities. Long Term Goal 1 Progress: Progressing toward goal Long Term Goal 2: Patient will decrease her right shoulder and cervical pain to 3/10 when cooking a meal. Long Term Goal 2 Progress: Progressing toward goal Long Term Goal 3: Patient will decrease fascial restrictions to min in her right shoulder and cervical region. Long Term Goal 3 Progress: Progressing toward goal Long Term Goal 4: Patient will improve right shoulder joint stability from good- to good for increased ability to maintain correct posture when walking with less difficulty. Long Term Goal 4 Progress: Progressing toward goal Long Term Goal 5: Patient will increase  cervical  ROM to WNL to be able to turn head and scan left and right for traffic with increased comfort. Long Term Goal 5 Progress: Progressing toward goal  Problem List Patient Active Problem List   Diagnosis Date Noted  . Muscle weakness (generalized) 11/24/2012  . Left shoulder pain 11/13/2012  . Unspecified constipation 09/05/2012  . H/O falling 08/01/2012  . Bronchitis 07/31/2012  . Routine general medical examination at a health care facility 04/16/2012  . FH: colon cancer 02/23/2012  . RLQ abdominal pain 02/17/2012  . Abdominal pain 02/01/2012  . Rib pain on right side 02/01/2012  . Memory loss 11/08/2011  . Other general symptoms 11/08/2011  . Other vitamin B12 deficiency anemia 11/08/2011  . Essential and other specified forms of tremor 11/08/2011  . Hyperlipidemia 10/31/2011  . Unsteady gait 10/14/2011  . Ankle pain, right 10/14/2011  . Leg swelling 10/14/2011  . Osteoporosis   . Other and unspecified hyperlipidemia 03/16/2011  . Prediabetes 03/16/2011  . BURSITIS, RIGHT KNEE 01/05/2010  . SPINAL STENOSIS, LUMBAR 11/20/2009  . SCIATICA 11/13/2009  . OSTEOARTHRITIS, KNEE, RIGHT 09/11/2009  . UNSTEADY GAIT 09/11/2009  . MEDIAL MENISCUS TEAR, RIGHT 09/11/2009  . ESSENTIAL HYPERTENSION, BENIGN 01/02/2009  . GERD 01/02/2009  . HEMORRHAGE OF RECTUM AND ANUS 01/02/2009  . PAIN IN JOINT, HAND 11/20/2008  . Pain in joint, pelvic region and thigh 11/20/2008  . KNEE PAIN 11/20/2008  . ARTHRITIS, LEFT FOOT 07/17/2007    End of Session Activity Tolerance: Patient tolerated treatment well General Behavior During Therapy: [facility] for tasks assessed/performed Cognition: WFL for tasks performed  GO    Shirlean Mylar, OTR/L  12/07/2012, 4:52 PM

## 2012-12-11 ENCOUNTER — Ambulatory Visit (HOSPITAL_COMMUNITY)
Admission: RE | Admit: 2012-12-11 | Discharge: 2012-12-11 | Disposition: A | Discharge status: Home / Self Care | Payer: Medicare Other | Source: Ambulatory Visit | Attending: Family Medicine | Admitting: Family Medicine

## 2012-12-11 DIAGNOSIS — M25519 Pain in unspecified shoulder: Secondary | ICD-10-CM | POA: Insufficient documentation

## 2012-12-11 DIAGNOSIS — M6281 Muscle weakness (generalized): Secondary | ICD-10-CM | POA: Insufficient documentation

## 2012-12-11 DIAGNOSIS — IMO0001 Reserved for inherently not codable concepts without codable children: Secondary | ICD-10-CM | POA: Insufficient documentation

## 2012-12-11 NOTE — Progress Notes (Signed)
Occupational Therapy Treatment Patient Details  Name: Erin [name] MRN: [id number] Date of Birth: 1930/05/16  Today's Date: 12/11/2012 Time: 1522-1600 OT Time Calculation (min): 38 min MFR 4098-1191 18' Therex 20'  Visit#: 4 of 12  Re-eval: 12/22/12    Authorization: medicare  Authorization Time Period: before 10th visit  Authorization Visit#: 4 of 10  Subjective Symptoms/Limitations Symptoms: S: My neck only hurts when I'm driving.  Pain Assessment Currently in Pain?: No/denies  Precautions/Restrictions  Precautions Precautions: None  Exercise/Treatments Supine Protraction: PROM;10 reps;AROM;12 reps Horizontal ABduction: PROM;10 reps;AROM;12 reps External Rotation: PROM;10 reps;AROM;12 reps Internal Rotation: PROM;10 reps;AROM;12 reps Flexion: PROM;10 reps;AROM;12 reps ABduction: PROM;10 reps;AROM;12 reps Therapy Ball Flexion: 15 reps ABduction: 15 reps  Supine Exercises Neck Retraction: 5 reps;3 secs Cervical Rotation: Both;5 reps Lateral Flexion: Both;5 reps    Manual Therapy Manual Therapy: Myofascial release Myofascial Release: Manual cervical traction, MFR to bilateral trapezius, SCM, rhomboid region, platysmus, left scapular regions to decrease pain and fascial restrictions and increase pain free mobility.  Occupational Therapy Assessment and Plan OT Assessment and Plan Clinical Impression Statement: A: Pt experienced some pain during AROM supine. Pt states that before she was coming to therapy she was unable to turn her head and look side to side. Now she is able to do it with less difficulty.  OT Treatment/Interventions:  (ionotphoresis and TENS unit) OT Plan: P:  Add scapular stability exercises in seated:  UBE in reverse, thumbtacks, prot/ret//elev/dep.   Goals Short Term Goals Time to Complete Short Term Goals: 3 weeks Short Term Goal 1: Patient will be educated on HEP. Short Term Goal 1 Progress: Progressing toward goal Short Term Goal 2:  Patient will decrease her right shoulder and cervical pain to 5/10 when cooking a meal. Short Term Goal 2 Progress: Progressing toward goal Short Term Goal 3: Patient will decrease fascial restrictions to mod in her right shoulder and cervical region. Short Term Goal 3 Progress: Progressing toward goal Short Term Goal 4: Patient will improve right shoulder joint stability from fair to good - for increased ability to maintain correct posture when walking with less difficulty. Short Term Goal 4 Progress: Progressing toward goal Short Term Goal 5: Patient will improve cervical ROM to [facility] to be able to drive with increased comfort without needing to get out of car due to increased pain.  Short Term Goal 5 Progress: Progressing toward goal Long Term Goals Time to Complete Long Term Goals: 6 weeks Long Term Goal 1: Patient will return to prior level of independence with all daily and leisure activities. Long Term Goal 1 Progress: Progressing toward goal Long Term Goal 2: Patient will decrease her right shoulder and cervical pain to 3/10 when cooking a meal. Long Term Goal 2 Progress: Progressing toward goal Long Term Goal 3: Patient will decrease fascial restrictions to min in her right shoulder and cervical region. Long Term Goal 3 Progress: Progressing toward goal Long Term Goal 4: Patient will improve right shoulder joint stability from good- to good for increased ability to maintain correct posture when walking with less difficulty. Long Term Goal 4 Progress: Progressing toward goal Long Term Goal 5: Patient will increase cervical ROM to WNL to be able to turn head and scan left and right for traffic with increased comfort. Long Term Goal 5 Progress: Progressing toward goal  Problem List Patient Active Problem List   Diagnosis Date Noted  . Muscle weakness (generalized) 11/24/2012  . Left shoulder pain 11/13/2012  . Unspecified constipation 09/05/2012  .  H/O falling 08/01/2012  . Bronchitis  07/31/2012  . Routine general medical examination at a health care facility 04/16/2012  . FH: colon cancer 02/23/2012  . RLQ abdominal pain 02/17/2012  . Abdominal pain 02/01/2012  . Rib pain on right side 02/01/2012  . Memory loss 11/08/2011  . Other general symptoms 11/08/2011  . Other vitamin B12 deficiency anemia 11/08/2011  . Essential and other specified forms of tremor 11/08/2011  . Hyperlipidemia 10/31/2011  . Unsteady gait 10/14/2011  . Ankle pain, right 10/14/2011  . Leg swelling 10/14/2011  . Osteoporosis   . Other and unspecified hyperlipidemia 03/16/2011  . Prediabetes 03/16/2011  . BURSITIS, RIGHT KNEE 01/05/2010  . SPINAL STENOSIS, LUMBAR 11/20/2009  . SCIATICA 11/13/2009  . OSTEOARTHRITIS, KNEE, RIGHT 09/11/2009  . UNSTEADY GAIT 09/11/2009  . MEDIAL MENISCUS TEAR, RIGHT 09/11/2009  . ESSENTIAL HYPERTENSION, BENIGN 01/02/2009  . GERD 01/02/2009  . HEMORRHAGE OF RECTUM AND ANUS 01/02/2009  . PAIN IN JOINT, HAND 11/20/2008  . Pain in joint, pelvic region and thigh 11/20/2008  . KNEE PAIN 11/20/2008  . ARTHRITIS, LEFT FOOT 07/17/2007    End of Session Activity Tolerance: Patient tolerated treatment well General Behavior During Therapy: [facility] for tasks assessed/performed Cognition: [facility] for tasks performed  Limmie Patricia, OTR/L,CBIS   12/11/2012, 3:58 PM

## 2012-12-14 ENCOUNTER — Ambulatory Visit (HOSPITAL_COMMUNITY)
Admission: RE | Admit: 2012-12-14 | Discharge: 2012-12-14 | Disposition: A | Discharge status: Home / Self Care | Payer: Medicare Other | Source: Ambulatory Visit | Attending: Family Medicine | Admitting: Family Medicine

## 2012-12-14 NOTE — Progress Notes (Signed)
REVIEWED.  

## 2012-12-14 NOTE — Progress Notes (Signed)
Occupational Therapy Treatment Patient Details  Name: Erin [name] MRN: [id number] Date of Birth: 01/20/1930  Today's Date: 12/14/2012 Time: 4098-1191 OT Time Calculation (min): 33 min Manual Therapy [id number] 14' Therapeutic Exercises [phone] 26' Visit#: 5 of 12  Re-eval: 12/22/12    Authorization: medicare  Authorization Time Period: before 10th visit  Authorization Visit#: 5 of 10  Subjective Symptoms/Limitations Symptoms: S:  I think that brace is helping me.   Pain Assessment Currently in Pain?: No/denies  Exercise/Treatments Supine Protraction: PROM;10 reps;AROM;15 reps Horizontal ABduction: PROM;10 reps;AROM;15 reps External Rotation: PROM;10 reps;AROM;15 reps Internal Rotation: PROM;10 reps;AROM;15 reps Flexion: PROM;10 reps;AROM;15 reps ABduction: PROM;10 reps;AROM;15 reps Seated Horizontal ABduction: AROM;10 reps External Rotation: AROM;10 reps Internal Rotation: AROM;10 reps Flexion: AROM;10 reps Abduction: AROM;10 reps ROM / Strengthening / Isometric Strengthening Wall Wash: 2' with left arm  "W" Arms: 10 times X to V Arms:  10 times Pendulum:   Proximal Shoulder Strengthening, Seated: 10 times each without resting between exercises      Manual Therapy Manual Therapy: Myofascial release Myofascial Release: Manual cervical traction, MFR to bilateral trapezius, SCM, rhomboid region, platysmus, left scapular regions to decrease pain and fascial restrictions and increase pain free mobility.  Occupational Therapy Assessment and Plan OT Assessment and Plan Clinical Impression Statement: A:  Added AROM in seated with WFL AROM noted.  Added scapular stability exercises in seated.  OT Plan: P:  Add UBE, complete functional reaching into cabinet activity.    Goals Short Term Goals Time to Complete Short Term Goals: 3 weeks Short Term Goal 1: Patient will be educated on HEP. Short Term Goal 2: Patient will decrease her right shoulder and cervical pain to  5/10 when cooking a meal. Short Term Goal 3: Patient will decrease fascial restrictions to mod in her right shoulder and cervical region. Short Term Goal 4: Patient will improve right shoulder joint stability from fair to good - for increased ability to maintain correct posture when walking with less difficulty. Short Term Goal 5: Patient will improve cervical ROM to [facility] to be able to drive with increased comfort without needing to get out of car due to increased pain.  Long Term Goals Time to Complete Long Term Goals: 6 weeks Long Term Goal 1: Patient will return to prior level of independence with all daily and leisure activities. Long Term Goal 2: Patient will decrease her right shoulder and cervical pain to 3/10 when cooking a meal. Long Term Goal 3: Patient will decrease fascial restrictions to min in her right shoulder and cervical region. Long Term Goal 4: Patient will improve right shoulder joint stability from good- to good for increased ability to maintain correct posture when walking with less difficulty. Long Term Goal 5: Patient will increase cervical ROM to WNL to be able to turn head and scan left and right for traffic with increased comfort.  Problem List Patient Active Problem List   Diagnosis Date Noted  . Muscle weakness (generalized) 11/24/2012  . Left shoulder pain 11/13/2012  . Unspecified constipation 09/05/2012  . H/O falling 08/01/2012  . Bronchitis 07/31/2012  . Routine general medical examination at a health care facility 04/16/2012  . FH: colon cancer 02/23/2012  . RLQ abdominal pain 02/17/2012  . Abdominal pain 02/01/2012  . Rib pain on right side 02/01/2012  . Memory loss 11/08/2011  . Other general symptoms 11/08/2011  . Other vitamin B12 deficiency anemia 11/08/2011  . Essential and other specified forms of tremor 11/08/2011  . Hyperlipidemia 10/31/2011  .  Unsteady gait 10/14/2011  . Ankle pain, right 10/14/2011  . Leg swelling 10/14/2011  .  Osteoporosis   . Other and unspecified hyperlipidemia 03/16/2011  . Prediabetes 03/16/2011  . BURSITIS, RIGHT KNEE 01/05/2010  . SPINAL STENOSIS, LUMBAR 11/20/2009  . SCIATICA 11/13/2009  . OSTEOARTHRITIS, KNEE, RIGHT 09/11/2009  . UNSTEADY GAIT 09/11/2009  . MEDIAL MENISCUS TEAR, RIGHT 09/11/2009  . ESSENTIAL HYPERTENSION, BENIGN 01/02/2009  . GERD 01/02/2009  . HEMORRHAGE OF RECTUM AND ANUS 01/02/2009  . PAIN IN JOINT, HAND 11/20/2008  . Pain in joint, pelvic region and thigh 11/20/2008  . KNEE PAIN 11/20/2008  . ARTHRITIS, LEFT FOOT 07/17/2007    End of Session Activity Tolerance: Patient tolerated treatment well General Behavior During Therapy: [facility] for tasks assessed/performed Cognition: WFL for tasks performed  GO    Shirlean Mylar, OTR/L  12/14/2012, 4:07 PM

## 2012-12-17 ENCOUNTER — Other Ambulatory Visit: Payer: Self-pay | Admitting: Family Medicine

## 2012-12-18 ENCOUNTER — Ambulatory Visit (HOSPITAL_COMMUNITY)
Admission: RE | Admit: 2012-12-18 | Discharge: 2012-12-18 | Disposition: A | Discharge status: Home / Self Care | Payer: Medicare Other | Source: Ambulatory Visit | Attending: Specialist | Admitting: Specialist

## 2012-12-18 NOTE — Progress Notes (Signed)
Occupational Therapy Treatment Patient Details  Name: Erin [name] MRN: [id number] Date of Birth: 1930-03-02  Today's Date: 12/18/2012 Time: 1526-1610 OT Time Calculation (min): 44 min MFR [phone] 14' Therex 1540-1610  30'  Visit#: 6 of 12  Re-eval: 12/22/12    Authorization: medicare  Authorization Time Period: before 10th visit  Authorization Visit#: 6 of 10  Subjective Symptoms/Limitations Symptoms: S: I am so tired today. I've been helping out my sister with my brother-in-law all weekend and today.  Pain Assessment Currently in Pain?: No/denies  Precautions/Restrictions  Precautions Precautions: None  Exercise/Treatments Supine Protraction: PROM;10 reps;AROM;15 reps Horizontal ABduction: PROM;10 reps;AROM;15 reps External Rotation: PROM;10 reps;AROM;15 reps Internal Rotation: PROM;10 reps;AROM;15 reps Flexion: PROM;10 reps;AROM;15 reps ABduction: PROM;10 reps;AROM;15 reps Seated Protraction: AAROM;10 reps Horizontal ABduction: AAROM;10 reps External Rotation: AAROM;10 reps Internal Rotation: AAROM;10 reps Flexion: AAROM;10 reps Abduction: AAROM;10 reps Therapy Ball Flexion: 15 reps ABduction: 15 reps ROM / Strengthening / Isometric Strengthening UBE (Upper Arm Bike): 1.0 3' reverse 3' forward "W" Arms: 10 times X to V Arms:  10 times      Manual Therapy Manual Therapy: Myofascial release Myofascial Release: Manual cervical traction, MFR to bilateral trapezius, SCM, rhomboid region, platysmus, left scapular regions to decrease pain and fascial restrictions and increase pain free mobility.  Occupational Therapy Assessment and Plan OT Assessment and Plan Clinical Impression Statement: A: Added UBE bike. Tolerated well. Downgraded to AAROM seated since patient was having increased soreness during AROM supine.  OT Plan: P: REASSESS. Complete functional reaching into cabinet activity. Resume AROM seated.   Goals Short Term Goals Time to Complete Short  Term Goals: 3 weeks Short Term Goal 1: Patient will be educated on HEP. Short Term Goal 1 Progress: Progressing toward goal Short Term Goal 2: Patient will decrease her right shoulder and cervical pain to 5/10 when cooking a meal. Short Term Goal 2 Progress: Progressing toward goal Short Term Goal 3: Patient will decrease fascial restrictions to mod in her right shoulder and cervical region. Short Term Goal 3 Progress: Progressing toward goal Short Term Goal 4: Patient will improve right shoulder joint stability from fair to good - for increased ability to maintain correct posture when walking with less difficulty. Short Term Goal 4 Progress: Progressing toward goal Short Term Goal 5: Patient will improve cervical ROM to [facility] to be able to drive with increased comfort without needing to get out of car due to increased pain.  Short Term Goal 5 Progress: Progressing toward goal Long Term Goals Time to Complete Long Term Goals: 6 weeks Long Term Goal 1: Patient will return to prior level of independence with all daily and leisure activities. Long Term Goal 1 Progress: Progressing toward goal Long Term Goal 2: Patient will decrease her right shoulder and cervical pain to 3/10 when cooking a meal. Long Term Goal 2 Progress: Progressing toward goal Long Term Goal 3: Patient will decrease fascial restrictions to min in her right shoulder and cervical region. Long Term Goal 3 Progress: Progressing toward goal Long Term Goal 4: Patient will improve right shoulder joint stability from good- to good for increased ability to maintain correct posture when walking with less difficulty. Long Term Goal 4 Progress: Progressing toward goal Long Term Goal 5: Patient will increase cervical ROM to WNL to be able to turn head and scan left and right for traffic with increased comfort. Long Term Goal 5 Progress: Progressing toward goal  Problem List Patient Active Problem List   Diagnosis Date Noted  .  Muscle  weakness (generalized) 11/24/2012  . Left shoulder pain 11/13/2012  . Unspecified constipation 09/05/2012  . H/O falling 08/01/2012  . Bronchitis 07/31/2012  . Routine general medical examination at a health care facility 04/16/2012  . FH: colon cancer 02/23/2012  . RLQ abdominal pain 02/17/2012  . Abdominal pain 02/01/2012  . Rib pain on right side 02/01/2012  . Memory loss 11/08/2011  . Other general symptoms 11/08/2011  . Other vitamin B12 deficiency anemia 11/08/2011  . Essential and other specified forms of tremor 11/08/2011  . Hyperlipidemia 10/31/2011  . Unsteady gait 10/14/2011  . Ankle pain, right 10/14/2011  . Leg swelling 10/14/2011  . Osteoporosis   . Other and unspecified hyperlipidemia 03/16/2011  . Prediabetes 03/16/2011  . BURSITIS, RIGHT KNEE 01/05/2010  . SPINAL STENOSIS, LUMBAR 11/20/2009  . SCIATICA 11/13/2009  . OSTEOARTHRITIS, KNEE, RIGHT 09/11/2009  . UNSTEADY GAIT 09/11/2009  . MEDIAL MENISCUS TEAR, RIGHT 09/11/2009  . ESSENTIAL HYPERTENSION, BENIGN 01/02/2009  . GERD 01/02/2009  . HEMORRHAGE OF RECTUM AND ANUS 01/02/2009  . PAIN IN JOINT, HAND 11/20/2008  . Pain in joint, pelvic region and thigh 11/20/2008  . KNEE PAIN 11/20/2008  . ARTHRITIS, LEFT FOOT 07/17/2007    End of Session Activity Tolerance: Patient tolerated treatment well General Behavior During Therapy: [facility] for tasks assessed/performed Cognition: [facility] for tasks performed   Limmie Patricia, OTR/L,CBIS   12/18/2012, 4:07 PM

## 2012-12-20 ENCOUNTER — Ambulatory Visit (HOSPITAL_COMMUNITY)
Admission: RE | Admit: 2012-12-20 | Discharge: 2012-12-20 | Disposition: A | Discharge status: Home / Self Care | Payer: Medicare Other | Source: Ambulatory Visit | Attending: Family Medicine | Admitting: Family Medicine

## 2012-12-20 NOTE — Evaluation (Signed)
Occupational Therapy Re-Evaluation  Patient Details  Name: Erin [name] MRN: [id number] Date of Birth: [birth year]  Today's Date: 12/20/2012 Time: 7829-5621 OT Time Calculation (min): 39 min MFR 3086-5784 19' Therex 6962-9528 20'  Visit#: 7 of 20  Re-eval: 01/17/13  Assessment Diagnosis: Lt shoulder pain  Authorization: medicare  Authorization Time Period: before 17th visit  Authorization Visit#: 7 of 17   Past Medical History:  Past Medical History  Diagnosis Date  . Anxiety   . Arthritis   . Hyperlipidemia   . Hypertension   . Osteoporosis   . GERD (gastroesophageal reflux disease)   . Hearing loss   . Restless leg   . Constipation   . Tremor   . Swelling of limb     Leg Swelling  . Obese   . Dyslipidemia   . Gall bladder disease   . Cataract   . Carpal tunnel syndrome of left wrist    Past Surgical History:  Past Surgical History  Procedure Laterality Date  . Appendectomy    . Cholecystectomy    . Abdominal hysterectomy    . Fracture surgery      X 4 LEFT ARM  . Benign cyst removed from kidney and lung, bilateral mastectomy    . ,bilateral great toenail removal    . Knee arthroscopy  2012    right knee, Dr Romeo Apple  . Left forearm    . Breast surgery      bilateral 2000approx  . Tonsillectomy    . R hand surgery    . Bronchoscopy  02/15/2000  . Right vats.  "  . Right upper lobe wedge resection.  "  . Upper gastrointestinal endoscopy  11/25/1999    Esophagitis/ Normal proximal esophagus, stomach and duodenum  . Colonoscopy  01/17/2009    Dr. Arlyce Dice : Internal hemorrhoids/Diverticula, scattered in the ascending colon/  Moderate diverticulosis ascending colon to sigmoid colon  . Esophagogastroduodenoscopy   04/23/2003    Dr. Arlyce Dice: HIATAL HERNIA  . Colonoscopy  01/16/2001    Normal  . Cataract extraction, bilateral    . Esophagogastroduodenoscopy  03/20/2012    UXL:KGMWNUUV ring-LIKELY CAUSING MILD DYSPHAGIA/SMALL hiatal hernia/Multiple  sessile polyps ranging between 3-75mm , path benign  . Cataract extraction    . Carpal tunnel release      left wrist    Subjective Symptoms/Limitations Symptoms: S: My son says he can tell that therapy is helping me. Pain Assessment Currently in Pain?: Yes Pain Score:   2 Pain Location: Shoulder Pain Orientation: Left Pain Type: Acute pain  Precautions/Restrictions  Precautions Precautions: None   Assessment Additional Assessments LUE AROM (degrees) Left Shoulder Flexion: 160 Degrees (on eval: 130) Left Shoulder ABduction: 135 Degrees (on eval: 107) Left Shoulder Internal Rotation: 90 Degrees Left Shoulder External Rotation: 75 Degrees (on eval: 54) LUE Strength Left Shoulder Flexion: 4/5 (on eval: 4-/5) Left Shoulder ABduction: 4/5 (on eval: 4-/5) Left Shoulder Internal Rotation:  (4+/5) Left Shoulder External Rotation:  (4+/5) Cervical Assessment Cervical Assessment:  (Cervical ROM minimally restricted) Palpation Palpation: mod fascial restrictions in cervical region. Tenderness in trapezius region.     Exercise/Treatments    Manual Therapy Manual Therapy: Myofascial release Myofascial Release: Manual cervical traction, MFR to bilateral trapezius, SCM, rhomboid region, platysmus, left scapular regions to decrease pain and fascial restrictions and increase pain free mobility.  Occupational Therapy Assessment and Plan OT Assessment and Plan Clinical Impression Statement: A: See MD note for progress. Patient has met all short term goals. Patient  would like to focus on cervical ROM and pain reduction.  OT Plan: P:  Complete functional reaching into cabinet activity. Resume AROM seated.   Goals Short Term Goals Time to Complete Short Term Goals: 3 weeks Short Term Goal 1: Patient will be educated on HEP. Short Term Goal 1 Progress: Met Short Term Goal 2: Patient will decrease her right shoulder and cervical pain to 5/10 when cooking a meal. Short Term Goal 2  Progress: Met Short Term Goal 3: Patient will decrease fascial restrictions to mod in her right shoulder and cervical region. Short Term Goal 3 Progress: Met Short Term Goal 4: Patient will improve right shoulder joint stability from fair to good - for increased ability to maintain correct posture when walking with less difficulty. Short Term Goal 4 Progress: Met Short Term Goal 5: Patient will improve cervical ROM to [facility] to be able to drive with increased comfort without needing to get out of car due to increased pain.  Short Term Goal 5 Progress: Met Long Term Goals Time to Complete Long Term Goals: 6 weeks Long Term Goal 1: Patient will return to prior level of independence with all daily and leisure activities. Long Term Goal 1 Progress: Progressing toward goal Long Term Goal 2: Patient will decrease her right shoulder and cervical pain to 3/10 when cooking a meal. Long Term Goal 2 Progress: Met Long Term Goal 3: Patient will decrease fascial restrictions to min in her right shoulder and cervical region. Long Term Goal 3 Progress: Progressing toward goal Long Term Goal 4: Patient will improve right shoulder joint stability from good- to good for increased ability to maintain correct posture when walking with less difficulty. Long Term Goal 4 Progress: Progressing toward goal Long Term Goal 5: Patient will increase cervical ROM to WNL to be able to turn head and scan left and right for traffic with increased comfort. Long Term Goal 5 Progress: Progressing toward goal  Problem List Patient Active Problem List   Diagnosis Date Noted  . Muscle weakness (generalized) 11/24/2012  . Left shoulder pain 11/13/2012  . Unspecified constipation 09/05/2012  . H/O falling 08/01/2012  . Bronchitis 07/31/2012  . Routine general medical examination at a health care facility 04/16/2012  . FH: colon cancer 02/23/2012  . RLQ abdominal pain 02/17/2012  . Abdominal pain 02/01/2012  . Rib pain on right  side 02/01/2012  . Memory loss 11/08/2011  . Other general symptoms 11/08/2011  . Other vitamin B12 deficiency anemia 11/08/2011  . Essential and other specified forms of tremor 11/08/2011  . Hyperlipidemia 10/31/2011  . Unsteady gait 10/14/2011  . Ankle pain, right 10/14/2011  . Leg swelling 10/14/2011  . Osteoporosis   . Other and unspecified hyperlipidemia 03/16/2011  . Prediabetes 03/16/2011  . BURSITIS, RIGHT KNEE 01/05/2010  . SPINAL STENOSIS, LUMBAR 11/20/2009  . SCIATICA 11/13/2009  . OSTEOARTHRITIS, KNEE, RIGHT 09/11/2009  . UNSTEADY GAIT 09/11/2009  . MEDIAL MENISCUS TEAR, RIGHT 09/11/2009  . ESSENTIAL HYPERTENSION, BENIGN 01/02/2009  . GERD 01/02/2009  . HEMORRHAGE OF RECTUM AND ANUS 01/02/2009  . PAIN IN JOINT, HAND 11/20/2008  . Pain in joint, pelvic region and thigh 11/20/2008  . KNEE PAIN 11/20/2008  . ARTHRITIS, LEFT FOOT 07/17/2007    End of Session Activity Tolerance: Patient tolerated treatment well General Behavior During Therapy: WFL for tasks assessed/performed Cognition: WFL for tasks performed  GO Functional Assessment Tool Used: DASH score of 34.0 with ideal score of 0 (On eval: 77.2) Functional Limitation: Self care  Self Care Current Status [phone]): At least 20 percent but less than 40 percent impaired, limited or restricted Self Care Goal Status (U0454): At least 1 percent but less than 20 percent impaired, limited or restricted  Limmie Patricia, OTR/L,CBIS   12/20/2012, 3:41 PM  Physician Documentation Your signature is required to indicate approval of the treatment plan as stated above.  Please sign and either send electronically or make a copy of this report for your files and return this physician signed original.  Please mark one 1.__approve of plan  2. ___approve of plan with the following conditions.   ______________________________                                                          _____________________ Physician Signature                                                                                                              Date

## 2012-12-25 ENCOUNTER — Ambulatory Visit (HOSPITAL_COMMUNITY): Payer: Medicare Other | Admitting: Specialist

## 2012-12-29 ENCOUNTER — Inpatient Hospital Stay (HOSPITAL_COMMUNITY): Admission: RE | Admit: 2012-12-29 | Payer: Medicare Other | Source: Ambulatory Visit | Admitting: Specialist

## 2013-01-01 ENCOUNTER — Ambulatory Visit (HOSPITAL_COMMUNITY)
Admission: RE | Admit: 2013-01-01 | Discharge: 2013-01-01 | Disposition: A | Discharge status: Home / Self Care | Payer: Medicare Other | Source: Ambulatory Visit | Attending: Family Medicine | Admitting: Family Medicine

## 2013-01-01 NOTE — Progress Notes (Signed)
Occupational Therapy Treatment Patient Details  Name: Erin [name] MRN: [id number] Date of Birth: 29-Feb-1930  Today's Date: 01/01/2013 Time: 4098-1191 OT Time Calculation (min): 43 min MFR 4782-9562 14' Therex 1308-6578 29'  Visit#: 8 of 20  Re-eval: 01/17/13    Authorization: medicare  Authorization Time Period: before 17th visit  Authorization Visit#: 8 of 17  Subjective Symptoms/Limitations Symptoms: S: My shoulder is not hurting. I can definitely tell my neck is getting better too.  Pain Assessment Currently in Pain?: No/denies  Precautions/Restrictions  Precautions Precautions: None  Exercise/Treatments Supine Protraction: AROM;15 reps Horizontal ABduction: AROM;15 reps External Rotation: AROM;15 reps Internal Rotation: AROM;15 reps Flexion: AROM;15 reps ABduction: AROM;15 reps Seated Elevation: AROM;10 reps Extension: AROM;10 reps Row: AROM;10 reps Protraction: AROM;10 reps Horizontal ABduction: AROM;10 reps External Rotation: AROM;10 reps Internal Rotation: AROM;10 reps Flexion: AROM;10 reps Abduction: AROM;10 reps Seated Exercises Cervical Rotation: Right;Left;10 reps Lateral Flexion: Left;Right;10 reps Other Seated Exercise: cervical flexion extension 10X each    Manual Therapy Manual Therapy: Myofascial release Myofascial Release: Manual cervical traction, MFR to bilateral trapezius, SCM, rhomboid region, platysmus, left scapular regions to decrease pain and fascial restrictions and increase pain free mobility.  Occupational Therapy Assessment and Plan OT Assessment and Plan Clinical Impression Statement: A: Patient with increased AROM supine and seated with left shoulder. Added AROM seated. Pt tolerated well with min vc's for form and technique.  OT Plan: P: Add 1# weight supine. Resume missed exercises.    Goals Short Term Goals Time to Complete Short Term Goals: 3 weeks Short Term Goal 1: Patient will be educated on HEP. Short Term Goal  2: Patient will decrease her right shoulder and cervical pain to 5/10 when cooking a meal. Short Term Goal 3: Patient will decrease fascial restrictions to mod in her right shoulder and cervical region. Short Term Goal 4: Patient will improve right shoulder joint stability from fair to good - for increased ability to maintain correct posture when walking with less difficulty. Short Term Goal 5: Patient will improve cervical ROM to [facility] to be able to drive with increased comfort without needing to get out of car due to increased pain.  Long Term Goals Time to Complete Long Term Goals: 6 weeks Long Term Goal 1: Patient will return to prior level of independence with all daily and leisure activities. Long Term Goal 1 Progress: Progressing toward goal Long Term Goal 2: Patient will decrease her right shoulder and cervical pain to 3/10 when cooking a meal. Long Term Goal 3: Patient will decrease fascial restrictions to min in her right shoulder and cervical region. Long Term Goal 3 Progress: Progressing toward goal Long Term Goal 4: Patient will improve right shoulder joint stability from good- to good for increased ability to maintain correct posture when walking with less difficulty. Long Term Goal 4 Progress: Progressing toward goal Long Term Goal 5: Patient will increase cervical ROM to WNL to be able to turn head and scan left and right for traffic with increased comfort. Long Term Goal 5 Progress: Progressing toward goal  Problem List Patient Active Problem List   Diagnosis Date Noted  . Muscle weakness (generalized) 11/24/2012  . Left shoulder pain 11/13/2012  . Unspecified constipation 09/05/2012  . H/O falling 08/01/2012  . Bronchitis 07/31/2012  . Routine general medical examination at a health care facility 04/16/2012  . FH: colon cancer 02/23/2012  . RLQ abdominal pain 02/17/2012  . Abdominal pain 02/01/2012  . Rib pain on right side 02/01/2012  .  Memory loss 11/08/2011  . Other  general symptoms 11/08/2011  . Other vitamin B12 deficiency anemia 11/08/2011  . Essential and other specified forms of tremor 11/08/2011  . Hyperlipidemia 10/31/2011  . Unsteady gait 10/14/2011  . Ankle pain, right 10/14/2011  . Leg swelling 10/14/2011  . Osteoporosis   . Other and unspecified hyperlipidemia 03/16/2011  . Prediabetes 03/16/2011  . BURSITIS, RIGHT KNEE 01/05/2010  . SPINAL STENOSIS, LUMBAR 11/20/2009  . SCIATICA 11/13/2009  . OSTEOARTHRITIS, KNEE, RIGHT 09/11/2009  . UNSTEADY GAIT 09/11/2009  . MEDIAL MENISCUS TEAR, RIGHT 09/11/2009  . ESSENTIAL HYPERTENSION, BENIGN 01/02/2009  . GERD 01/02/2009  . HEMORRHAGE OF RECTUM AND ANUS 01/02/2009  . PAIN IN JOINT, HAND 11/20/2008  . Pain in joint, pelvic region and thigh 11/20/2008  . KNEE PAIN 11/20/2008  . ARTHRITIS, LEFT FOOT 07/17/2007    End of Session Activity Tolerance: Patient tolerated treatment well General Behavior During Therapy: [facility] for tasks assessed/performed Cognition: [facility] for tasks performed   Limmie Patricia, OTR/L,CBIS   01/01/2013, 4:11 PM

## 2013-01-04 ENCOUNTER — Ambulatory Visit (HOSPITAL_COMMUNITY): Payer: Medicare Other

## 2013-01-05 ENCOUNTER — Ambulatory Visit (HOSPITAL_COMMUNITY)
Admission: RE | Admit: 2013-01-05 | Discharge: 2013-01-05 | Disposition: A | Discharge status: Home / Self Care | Payer: Medicare Other | Source: Ambulatory Visit | Attending: Family Medicine | Admitting: Family Medicine

## 2013-01-05 NOTE — Progress Notes (Signed)
Occupational Therapy Treatment Patient Details  Name: Erin [name] MRN: [id number] Date of Birth: 04/04/1930  Today's Date: 01/05/2013 Time: 4098-1191 OT Time Calculation (min): 18 min Manual Therapy 18' Visit#: 9 of 20  Re-eval: 01/17/13    Authorization: medicare  Authorization Time Period: before 17th visit  Authorization Visit#: 9 of 17  Subjective S:  I dont have any pain, I havent had any time to think about it. Limitations:  AVOID combining ER/IR and abduction.     Pain Assessment Currently in Pain?: No/denies Pain Score: 0-No pain  Precautions/Restrictions   progress as tolerated  Exercise/Treatments Supine Protraction: PROM;10 reps Horizontal ABduction: PROM;10 reps External Rotation: PROM;10 reps Internal Rotation: PROM;10 reps Flexion: PROM;10 reps ABduction: PROM;10 reps    Manual Therapy Manual Therapy: Myofascial release Myofascial Release: Manual cervical traction, MFR to bilateral trapezius, SCM, rhomboid region, platysmus, left scapular regions to decrease pain and fascial restrictions and increase pain free mobility.  Occupational Therapy Assessment and Plan OT Assessment and Plan Clinical Impression Statement: A:  Patient requested treatment to stop after manual therapy due to not feeling well.  OT Plan: P:  Begin strengthening in supine.     Goals Short Term Goals Time to Complete Short Term Goals: 3 weeks Short Term Goal 1: Patient will be educated on HEP. Short Term Goal 2: Patient will decrease her right shoulder and cervical pain to 5/10 when cooking a meal. Short Term Goal 3: Patient will decrease fascial restrictions to mod in her right shoulder and cervical region. Short Term Goal 4: Patient will improve right shoulder joint stability from fair to good - for increased ability to maintain correct posture when walking with less difficulty. Short Term Goal 5: Patient will improve cervical ROM to [facility] to be able to drive with increased  comfort without needing to get out of car due to increased pain.  Long Term Goals Time to Complete Long Term Goals: 6 weeks Long Term Goal 1: Patient will return to prior level of independence with all daily and leisure activities. Long Term Goal 1 Progress: Progressing toward goal Long Term Goal 2: Patient will decrease her right shoulder and cervical pain to 3/10 when cooking a meal. Long Term Goal 3: Patient will decrease fascial restrictions to min in her right shoulder and cervical region. Long Term Goal 3 Progress: Progressing toward goal Long Term Goal 4: Patient will improve right shoulder joint stability from good- to good for increased ability to maintain correct posture when walking with less difficulty. Long Term Goal 4 Progress: Progressing toward goal Long Term Goal 5: Patient will increase cervical ROM to WNL to be able to turn head and scan left and right for traffic with increased comfort. Long Term Goal 5 Progress: Progressing toward goal  Problem List Patient Active Problem List   Diagnosis Date Noted  . Muscle weakness (generalized) 11/24/2012  . Left shoulder pain 11/13/2012  . Unspecified constipation 09/05/2012  . H/O falling 08/01/2012  . Bronchitis 07/31/2012  . Routine general medical examination at a health care facility 04/16/2012  . FH: colon cancer 02/23/2012  . RLQ abdominal pain 02/17/2012  . Abdominal pain 02/01/2012  . Rib pain on right side 02/01/2012  . Memory loss 11/08/2011  . Other general symptoms 11/08/2011  . Other vitamin B12 deficiency anemia 11/08/2011  . Essential and other specified forms of tremor 11/08/2011  . Hyperlipidemia 10/31/2011  . Unsteady gait 10/14/2011  . Ankle pain, right 10/14/2011  . Leg swelling 10/14/2011  .  Osteoporosis   . Other and unspecified hyperlipidemia 03/16/2011  . Prediabetes 03/16/2011  . BURSITIS, RIGHT KNEE 01/05/2010  . SPINAL STENOSIS, LUMBAR 11/20/2009  . SCIATICA 11/13/2009  . OSTEOARTHRITIS,  KNEE, RIGHT 09/11/2009  . UNSTEADY GAIT 09/11/2009  . MEDIAL MENISCUS TEAR, RIGHT 09/11/2009  . ESSENTIAL HYPERTENSION, BENIGN 01/02/2009  . GERD 01/02/2009  . HEMORRHAGE OF RECTUM AND ANUS 01/02/2009  . PAIN IN JOINT, HAND 11/20/2008  . Pain in joint, pelvic region and thigh 11/20/2008  . KNEE PAIN 11/20/2008  . ARTHRITIS, LEFT FOOT 07/17/2007    End of Session Activity Tolerance: Patient tolerated treatment well General Behavior During Therapy: [facility] for tasks assessed/performed Cognition: [facility] for tasks performed  GO    Shirlean Mylar, OTR/L  01/05/2013, 3:44 PM

## 2013-01-08 ENCOUNTER — Ambulatory Visit (HOSPITAL_COMMUNITY)
Admission: RE | Admit: 2013-01-08 | Discharge: 2013-01-08 | Disposition: A | Discharge status: Home / Self Care | Payer: Medicare Other | Source: Ambulatory Visit | Attending: Family Medicine | Admitting: Family Medicine

## 2013-01-08 NOTE — Progress Notes (Signed)
Occupational Therapy Treatment Patient Details  Name: Erin [name] MRN: [id number] Date of Birth: 30-Mar-1930  Today's Date: 01/08/2013 Time: 1520-1601 OT Time Calculation (min): 41 min MFR 1520-1530 10' Therex 6578-4696 31'  Visit#: 10 of 20  Re-eval: 01/17/13    Authorization: medicare  Authorization Time Period: before 17th visit  Authorization Visit#: 10 of 17  Subjective Symptoms/Limitations Symptoms: S: I am so tired from helping at that house. If my shoulder hurts I don't have time to think about it.  Pain Assessment Currently in Pain?: No/denies  Precautions/Restrictions  Precautions Precautions: None  Exercise/Treatments Supine Protraction: PROM;Strengthening;Weights;10 reps Protraction Weight (lbs): 1 Horizontal ABduction: PROM;Strengthening;Weights;10 reps Horizontal ABduction Weight (lbs): 1 External Rotation: PROM;Strengthening;Weights;10 reps External Rotation Weight (lbs): 1 Internal Rotation: PROM;Strengthening;Weights;10 reps Internal Rotation Weight (lbs): 1 Flexion: PROM;Strengthening;Weights;10 reps Shoulder Flexion Weight (lbs): 1 ABduction: PROM;Strengthening;Weights;10 reps Shoulder ABduction Weight (lbs): 1 Seated Row: Strengthening;Weights;10 reps Horizontal ABduction: Strengthening;Weights;10 reps Horizontal ABduction Weight (lbs): 1 External Rotation: Strengthening;Weights;10 reps External Rotation Weight (lbs): 1 Internal Rotation: Strengthening;Weights;10 reps Internal Rotation Weight (lbs): 1 Flexion: Strengthening;Weights;10 reps ROM / Strengthening / Isometric Strengthening UBE (Upper Arm Bike): 1.0 3' reverse 3' forward Proximal Shoulder Strengthening, Supine: 10X      Manual Therapy Manual Therapy: Myofascial release Myofascial Release: Manual cervical traction, MFR to bilateral trapezius, SCM, rhomboid region, platysmus, left scapular regions to decrease pain and fascial restrictions and increase pain free  mobility.  Occupational Therapy Assessment and Plan OT Assessment and Plan Clinical Impression Statement: A: Added 1# weight supine and seated. Patient tolerated well. Some fatigue during seated ABD. Added supine proximal shoulder strengthening.  OT Plan: P: Add X to V arms and W arms.    Goals Short Term Goals Time to Complete Short Term Goals: 3 weeks Short Term Goal 1: Patient will be educated on HEP. Short Term Goal 2: Patient will decrease her right shoulder and cervical pain to 5/10 when cooking a meal. Short Term Goal 3: Patient will decrease fascial restrictions to mod in her right shoulder and cervical region. Short Term Goal 4: Patient will improve right shoulder joint stability from fair to good - for increased ability to maintain correct posture when walking with less difficulty. Short Term Goal 5: Patient will improve cervical ROM to [facility] to be able to drive with increased comfort without needing to get out of car due to increased pain.  Long Term Goals Time to Complete Long Term Goals: 6 weeks Long Term Goal 1: Patient will return to prior level of independence with all daily and leisure activities. Long Term Goal 1 Progress: Progressing toward goal Long Term Goal 2: Patient will decrease her right shoulder and cervical pain to 3/10 when cooking a meal. Long Term Goal 3: Patient will decrease fascial restrictions to min in her right shoulder and cervical region. Long Term Goal 3 Progress: Progressing toward goal Long Term Goal 4: Patient will improve right shoulder joint stability from good- to good for increased ability to maintain correct posture when walking with less difficulty. Long Term Goal 4 Progress: Progressing toward goal Long Term Goal 5: Patient will increase cervical ROM to WNL to be able to turn head and scan left and right for traffic with increased comfort. Long Term Goal 5 Progress: Progressing toward goal  Problem List Patient Active Problem List    Diagnosis Date Noted  . Muscle weakness (generalized) 11/24/2012  . Left shoulder pain 11/13/2012  . Unspecified constipation 09/05/2012  . H/O falling 08/01/2012  .  Bronchitis 07/31/2012  . Routine general medical examination at a health care facility 04/16/2012  . FH: colon cancer 02/23/2012  . RLQ abdominal pain 02/17/2012  . Abdominal pain 02/01/2012  . Rib pain on right side 02/01/2012  . Memory loss 11/08/2011  . Other general symptoms 11/08/2011  . Other vitamin B12 deficiency anemia 11/08/2011  . Essential and other specified forms of tremor 11/08/2011  . Hyperlipidemia 10/31/2011  . Unsteady gait 10/14/2011  . Ankle pain, right 10/14/2011  . Leg swelling 10/14/2011  . Osteoporosis   . Other and unspecified hyperlipidemia 03/16/2011  . Prediabetes 03/16/2011  . BURSITIS, RIGHT KNEE 01/05/2010  . SPINAL STENOSIS, LUMBAR 11/20/2009  . SCIATICA 11/13/2009  . OSTEOARTHRITIS, KNEE, RIGHT 09/11/2009  . UNSTEADY GAIT 09/11/2009  . MEDIAL MENISCUS TEAR, RIGHT 09/11/2009  . ESSENTIAL HYPERTENSION, BENIGN 01/02/2009  . GERD 01/02/2009  . HEMORRHAGE OF RECTUM AND ANUS 01/02/2009  . PAIN IN JOINT, HAND 11/20/2008  . Pain in joint, pelvic region and thigh 11/20/2008  . KNEE PAIN 11/20/2008  . ARTHRITIS, LEFT FOOT 07/17/2007    End of Session Activity Tolerance: Patient tolerated treatment well General Behavior During Therapy: [facility] for tasks assessed/performed Cognition: [facility] for tasks performed   Limmie Patricia, OTR/L,CBIS   01/08/2013, 3:56 PM

## 2013-01-10 ENCOUNTER — Ambulatory Visit (HOSPITAL_COMMUNITY)
Admission: RE | Admit: 2013-01-10 | Discharge: 2013-01-10 | Disposition: A | Discharge status: Home / Self Care | Payer: Medicare Other | Source: Ambulatory Visit | Attending: Family Medicine | Admitting: Family Medicine

## 2013-01-10 DIAGNOSIS — IMO0001 Reserved for inherently not codable concepts without codable children: Secondary | ICD-10-CM | POA: Insufficient documentation

## 2013-01-10 DIAGNOSIS — M542 Cervicalgia: Secondary | ICD-10-CM | POA: Insufficient documentation

## 2013-01-10 DIAGNOSIS — I10 Essential (primary) hypertension: Secondary | ICD-10-CM | POA: Insufficient documentation

## 2013-01-10 DIAGNOSIS — M25519 Pain in unspecified shoulder: Secondary | ICD-10-CM | POA: Insufficient documentation

## 2013-01-10 DIAGNOSIS — M6281 Muscle weakness (generalized): Secondary | ICD-10-CM | POA: Insufficient documentation

## 2013-01-10 NOTE — Progress Notes (Signed)
Occupational Therapy Treatment Patient Details  Name: Erin [name] MRN: [id number] Date of Birth: [birth year]  Today's Date: 01/10/2013 Time: 1515-1600 OT Time Calculation (min): 45 min MFR 1191-4782 9' Therex 1524-1600 36'  Visit#: 11 of 20  Re-eval: 01/17/13    Authorization: medicare  Authorization Time Period: before 17th visit  Authorization Visit#: 11 of 17  Subjective Symptoms/Limitations Symptoms: S: I've only had to help out at my brother-in-law's house once this week. My shoulder and neck feel real good.  Pain Assessment Currently in Pain?: No/denies  Precautions/Restrictions  Precautions Precautions: None  Exercise/Treatments Supine Protraction: PROM;10 reps;Strengthening;12 reps Protraction Weight (lbs): 1 Horizontal ABduction: PROM;10 reps;Strengthening;12 reps Horizontal ABduction Weight (lbs): 1 External Rotation: PROM;10 reps;Strengthening;12 reps External Rotation Weight (lbs): 1 Internal Rotation: PROM;10 reps;Strengthening;12 reps Internal Rotation Weight (lbs): 1 Flexion: PROM;10 reps;Strengthening;12 reps Shoulder Flexion Weight (lbs): 1 ABduction: PROM;10 reps;Strengthening;12 reps Shoulder ABduction Weight (lbs): 1 ROM / Strengthening / Isometric Strengthening UBE (Upper Arm Bike): 1.5' 3' reverse 3' forward Wall Wash: 1' with 1# "W" Arms: 12 times no weight. max facilitation from therapist to retract shoulder blades X to V Arms: 12 times 1#      Manual Therapy Manual Therapy: Myofascial release Myofascial Release: Manual cervical traction, MFR to bilateral trapezius, SCM, rhomboid region, platysmus, left scapular regions to decrease pain and fascial restrictions and increase pain free mobility.  Occupational Therapy Assessment and Plan OT Assessment and Plan Clinical Impression Statement: A: Increased resistance on UBE bike. Added wrist weight for wall wash. Tolerated well.  OT Plan: P: REASSES and D/C with HEP.   Goals Short Term  Goals Time to Complete Short Term Goals: 3 weeks Short Term Goal 1: Patient will be educated on HEP. Short Term Goal 2: Patient will decrease her right shoulder and cervical pain to 5/10 when cooking a meal. Short Term Goal 3: Patient will decrease fascial restrictions to mod in her right shoulder and cervical region. Short Term Goal 4: Patient will improve right shoulder joint stability from fair to good - for increased ability to maintain correct posture when walking with less difficulty. Short Term Goal 5: Patient will improve cervical ROM to [facility] to be able to drive with increased comfort without needing to get out of car due to increased pain.  Long Term Goals Time to Complete Long Term Goals: 6 weeks Long Term Goal 1: Patient will return to prior level of independence with all daily and leisure activities. Long Term Goal 2: Patient will decrease her right shoulder and cervical pain to 3/10 when cooking a meal. Long Term Goal 3: Patient will decrease fascial restrictions to min in her right shoulder and cervical region. Long Term Goal 4: Patient will improve right shoulder joint stability from good- to good for increased ability to maintain correct posture when walking with less difficulty. Long Term Goal 5: Patient will increase cervical ROM to WNL to be able to turn head and scan left and right for traffic with increased comfort.  Problem List Patient Active Problem List   Diagnosis Date Noted  . Muscle weakness (generalized) 11/24/2012  . Left shoulder pain 11/13/2012  . Unspecified constipation 09/05/2012  . H/O falling 08/01/2012  . Bronchitis 07/31/2012  . Routine general medical examination at a health care facility 04/16/2012  . FH: colon cancer 02/23/2012  . RLQ abdominal pain 02/17/2012  . Abdominal pain 02/01/2012  . Rib pain on right side 02/01/2012  . Memory loss 11/08/2011  . Other general symptoms 11/08/2011  .  Other vitamin B12 deficiency anemia 11/08/2011  .  Essential and other specified forms of tremor 11/08/2011  . Hyperlipidemia 10/31/2011  . Unsteady gait 10/14/2011  . Ankle pain, right 10/14/2011  . Leg swelling 10/14/2011  . Osteoporosis   . Other and unspecified hyperlipidemia 03/16/2011  . Prediabetes 03/16/2011  . BURSITIS, RIGHT KNEE 01/05/2010  . SPINAL STENOSIS, LUMBAR 11/20/2009  . SCIATICA 11/13/2009  . OSTEOARTHRITIS, KNEE, RIGHT 09/11/2009  . UNSTEADY GAIT 09/11/2009  . MEDIAL MENISCUS TEAR, RIGHT 09/11/2009  . ESSENTIAL HYPERTENSION, BENIGN 01/02/2009  . GERD 01/02/2009  . HEMORRHAGE OF RECTUM AND ANUS 01/02/2009  . PAIN IN JOINT, HAND 11/20/2008  . Pain in joint, pelvic region and thigh 11/20/2008  . KNEE PAIN 11/20/2008  . ARTHRITIS, LEFT FOOT 07/17/2007    End of Session Activity Tolerance: Patient tolerated treatment well General Behavior During Therapy: [facility] for tasks assessed/performed Cognition: [facility] for tasks performed   Limmie Patricia, OTR/L,CBIS   01/10/2013, 4:01 PM

## 2013-01-11 ENCOUNTER — Telehealth: Payer: Self-pay | Admitting: Family Medicine

## 2013-01-15 ENCOUNTER — Ambulatory Visit (HOSPITAL_COMMUNITY): Payer: Medicare Other

## 2013-01-15 NOTE — Telephone Encounter (Signed)
Please advise 

## 2013-01-17 ENCOUNTER — Telehealth (HOSPITAL_COMMUNITY): Payer: Self-pay

## 2013-01-17 ENCOUNTER — Inpatient Hospital Stay (HOSPITAL_COMMUNITY): Admission: RE | Admit: 2013-01-17 | Payer: Medicare Other | Source: Ambulatory Visit

## 2013-01-17 NOTE — Progress Notes (Signed)
Occupational Therapy Note Patient Details  Name: Erin [name] MRN: [id number] Date of Birth: 1929/07/23  Today's Date: 01/17/2013  Patient will be discharged today per patient's request. Patient was due for a reassessment and cancelled last two appointments. Patient informed rehab secretary that she found her "stuff" at home and will do her exercises on her own.     GO Functional Assessment Tool Used: clinical observation Functional Limitation: Self care Self Care Current Status (W2956): At least 1 percent but less than 20 percent impaired, limited or restricted Self Care Goal Status (O1308): At least 1 percent but less than 20 percent impaired, limited or restricted Self Care Discharge Status [phone]): At least 1 percent but less than 20 percent impaired, limited or restricted  Limmie Patricia, OTR/L,CBIS   01/17/2013, 3:09 PM

## 2013-01-18 LAB — HEMOGLOBIN A1C
Hgb A1c MFr Bld: 5.9 % — ABNORMAL HIGH (ref <5.7)
Mean Plasma Glucose: 123 mg/dL — ABNORMAL HIGH (ref <117)

## 2013-01-18 LAB — BASIC METABOLIC PANEL
Chloride: 106 mEq/L (ref 96–112)
Potassium: 5.1 mEq/L (ref 3.5–5.3)
Sodium: 144 mEq/L (ref 135–145)

## 2013-01-18 NOTE — Telephone Encounter (Signed)
pls contact Alissa, I have never discussed DNR with pt and am not aware of any previous DNR form being completed

## 2013-01-19 ENCOUNTER — Other Ambulatory Visit: Payer: Self-pay | Admitting: Family Medicine

## 2013-01-19 LAB — LIPID PANEL
Cholesterol: 228 mg/dL — ABNORMAL HIGH (ref 0–200)
HDL: 47 mg/dL (ref >39)
LDL Cholesterol: 161 mg/dL — ABNORMAL HIGH (ref 0–99)
Total CHOL/HDL Ratio: 4.9 Ratio
Triglycerides: 100 mg/dL (ref <150)
VLDL: 20 mg/dL (ref 0–40)

## 2013-01-19 NOTE — Telephone Encounter (Signed)
Erin [name] out of office. Tried to call patient to relay message but had to leave message to call back

## 2013-01-22 NOTE — Telephone Encounter (Signed)
Left message with Serina Cowper since unable to reach patient. Advised she needed to bring a family member with her to her next appt and it would be discussed at that time

## 2013-01-25 ENCOUNTER — Telehealth: Payer: Self-pay

## 2013-01-25 NOTE — Telephone Encounter (Signed)
Is concerned about her liver since she has been taking cholesterol meds so long. I told her the labs she just had done didn't check her liver. Want to add it on? Hepatic panel. May be too late and need a redraw. Ordered in May but Resulted 01/19/13.

## 2013-01-25 NOTE — Telephone Encounter (Signed)
She doesn't want a redraw. States she will wait until she comes in again

## 2013-01-25 NOTE — Telephone Encounter (Signed)
[  pls add hepatic panel

## 2013-02-13 ENCOUNTER — Encounter: Payer: Self-pay | Admitting: Family Medicine

## 2013-02-13 ENCOUNTER — Ambulatory Visit (INDEPENDENT_AMBULATORY_CARE_PROVIDER_SITE_OTHER): Payer: Medicare Other | Admitting: Family Medicine

## 2013-02-13 VITALS — BP 124/80 | HR 82 | Resp 16 | Wt 169.0 lb

## 2013-02-13 DIAGNOSIS — I10 Essential (primary) hypertension: Secondary | ICD-10-CM

## 2013-02-13 DIAGNOSIS — N390 Urinary tract infection, site not specified: Secondary | ICD-10-CM | POA: Insufficient documentation

## 2013-02-13 DIAGNOSIS — Z7189 Other specified counseling: Secondary | ICD-10-CM

## 2013-02-13 DIAGNOSIS — R269 Unspecified abnormalities of gait and mobility: Secondary | ICD-10-CM

## 2013-02-13 DIAGNOSIS — R7309 Other abnormal glucose: Secondary | ICD-10-CM

## 2013-02-13 DIAGNOSIS — H9190 Unspecified hearing loss, unspecified ear: Secondary | ICD-10-CM | POA: Insufficient documentation

## 2013-02-13 DIAGNOSIS — Z66 Do not resuscitate: Secondary | ICD-10-CM

## 2013-02-13 DIAGNOSIS — R7303 Prediabetes: Secondary | ICD-10-CM

## 2013-02-13 DIAGNOSIS — E785 Hyperlipidemia, unspecified: Secondary | ICD-10-CM

## 2013-02-13 LAB — POCT URINALYSIS DIPSTICK
Glucose, UA: NEGATIVE
Nitrite, UA: NEGATIVE
Protein, UA: NEGATIVE
Spec Grav, UA: 1.02
Urobilinogen, UA: 0.2
pH, UA: 8

## 2013-02-13 MED ORDER — LOSARTAN POTASSIUM 50 MG PO TABS: 50.0000 mg | ORAL_TABLET | Freq: Every day | ORAL | Status: DC

## 2013-02-13 NOTE — Assessment & Plan Note (Signed)
Discussed use of er life alert system at all times, when she fell earlier this year she was not wearing it. Nee to look into adjusting shower stall for easier access

## 2013-02-13 NOTE — Assessment & Plan Note (Signed)
Under corrected. Needs to reduce fried and fatty foods to improve lipid profile

## 2013-02-13 NOTE — Assessment & Plan Note (Signed)
Patient's clearly expressed wish during office visit on 02/23/2013. Will verify that her only sn is aware and understands this is his mother wish and fill out a form States she discussed this with [facility] worker, which I believe to be the case, that her son was present , and she needs to replace a lost form which she had in her home. I will speak with [facility] worker personally also

## 2013-02-13 NOTE — Assessment & Plan Note (Signed)
Mildly symptomatic, slightly abnormal UA, will await c/s results to treat if needed

## 2013-02-13 NOTE — Assessment & Plan Note (Signed)
The importance of reduced sugar and carbohydrate intake is stressed Will obtain updated HBa1C

## 2013-02-13 NOTE — Progress Notes (Signed)
  Subjective:    Patient ID: Erin [name], female    DOB: [birth year], 79 y.o.   MRN: [id number]  HPI The PT is here for follow up and re-evaluation of chronic medical conditions, medication management and review of any available recent lab and radiology data.  Preventive health is updated, specifically  Cancer screening and Immunization.   Unfortunately lost her 30 y/o brother to pancreatic cancer in the last 3 months, and is now caring for her sister who has had a 2nd hip fracture in less than a year. Kashay herself traumatized her left shoulder when she fell earlier this year and is still recovering from this The PT denies any adverse reactions to current medications since the last visit.  Continues to c/o lght headedness espescialy with change in position, also hearing loss is a problem as is access to her shower due to limitation in mobility       Assessment:      Plan:         Review of Systems See HPI Denies recent fever or chills. Denies sinus pressure, nasal congestion, ear pain or sore throat. Denies chest congestion, productive cough or wheezing. Denies chest pains, palpitations and leg swelling Denies abdominal pain, nausea, vomiting,diarrhea or constipation.   C/o frequency with mild discomfort x 1 week, no flank or suprapubic pain  Denies headaches, seizures, numbness, or tingling. Denies depression, anxiety or insomnia. Denies skin break down or rash.        Objective:   Physical Exam  Patient alert and oriented and in no cardiopulmonary distress.  HEENT: No facial asymmetry, EOMI, no sinus tenderness,  oropharynx pink and moist.  Neck adequate ROM, no adenopathy.No JVD  Chest: Clear to auscultation bilaterally.  CVS: S1, S2 no murmurs, no S3.  ABD: Soft non tender. Bowel sounds normal.  Ext: No edema  MS: decreased ROM spine, shoulders, hips and knees.  Skin: Intact, no ulcerations or rash noted.  Psych: Good eye contact, normal affect.  Memory intact not anxious or depressed appearing.  CNS: CN 2-12 intact, power, tone and sensation normal throughout.       Assessment & Plan:

## 2013-02-13 NOTE — Assessment & Plan Note (Signed)
worsening

## 2013-02-13 NOTE — Patient Instructions (Addendum)
F/u mid to end September.  Stop hyzaar for blood pressure since light headed  Start cozaar 50 mg one daily in its place  You will be referred to the [facility] clinic in [place] for help with hearing aids  I will complete a DNR form for you and get this to you  I will try and get info on the company that modifies bathtubs for easier access

## 2013-02-13 NOTE — Assessment & Plan Note (Signed)
Open discussion with pt at this visit, she adamantly states that she never wants to be put on a ventilator , or have chest compression, repeatedly states that she saw thios attempted on her sister till  "her eyeballs nearly came out of her head" and she had to beg them to stop States she does not want this done to her.  Her only child is aware of her wishes and she is a widow. DNR will be documented in her record, and I will complete a form stating this for her to keep in her home

## 2013-02-13 NOTE — Assessment & Plan Note (Signed)
Overcorrected based on age , and pt experiences light headedness, change i therapy to remove HCTZ component

## 2013-02-14 ENCOUNTER — Telehealth: Payer: Self-pay | Admitting: Family Medicine

## 2013-02-14 NOTE — Telephone Encounter (Signed)
Patient aware that results are not available as of yet.

## 2013-02-17 LAB — URINE CULTURE: Colony Count: 40000

## 2013-02-18 ENCOUNTER — Other Ambulatory Visit: Payer: Self-pay | Admitting: Family Medicine

## 2013-02-23 ENCOUNTER — Other Ambulatory Visit: Payer: Self-pay

## 2013-02-23 MED ORDER — NITROFURANTOIN MONOHYD MACRO 100 MG PO CAPS: 100.0000 mg | ORAL_CAPSULE | Freq: Two times a day (BID) | ORAL | Status: DC

## 2013-03-08 ENCOUNTER — Telehealth: Payer: Self-pay

## 2013-03-08 MED ORDER — BENZONATATE 100 MG PO CAPS: 100.0000 mg | ORAL_CAPSULE | Freq: Three times a day (TID) | ORAL | Status: DC | PRN

## 2013-03-08 NOTE — Telephone Encounter (Signed)
Patient aware.

## 2013-03-08 NOTE — Telephone Encounter (Signed)
Thinks she has bronchitis, Coughing and congestion with white phlegm x 3 days no fever or chills.  Has tried nyquil with no relief. Wants to know if anything can be sent in  [facility]

## 2013-03-08 NOTE — Telephone Encounter (Signed)
Let her know from history provided does not sound as if she need antibiotics, pls send tessalon perles 100mg  3 time daily #21 ,and advise her to ensure adequate  fluid intake If symptoms worsen, fevr, chills SOB, weakness, yellow sputum then needs to go to urgent care or ED

## 2013-04-03 ENCOUNTER — Ambulatory Visit (INDEPENDENT_AMBULATORY_CARE_PROVIDER_SITE_OTHER): Payer: Medicare Other | Admitting: Family Medicine

## 2013-04-03 ENCOUNTER — Encounter: Payer: Self-pay | Admitting: Family Medicine

## 2013-04-03 ENCOUNTER — Encounter: Payer: Self-pay | Admitting: *Deleted

## 2013-04-03 VITALS — BP 122/78 | HR 96 | Temp 98.6°F | Resp 16 | Wt 170.0 lb

## 2013-04-03 DIAGNOSIS — R0789 Other chest pain: Secondary | ICD-10-CM | POA: Insufficient documentation

## 2013-04-03 DIAGNOSIS — J42 Unspecified chronic bronchitis: Secondary | ICD-10-CM

## 2013-04-03 DIAGNOSIS — K219 Gastro-esophageal reflux disease without esophagitis: Secondary | ICD-10-CM

## 2013-04-03 DIAGNOSIS — I10 Essential (primary) hypertension: Secondary | ICD-10-CM

## 2013-04-03 LAB — CBC
Hemoglobin: 12 g/dL (ref 12.0–15.0)
MCV: 96.1 fL (ref 78.0–100.0)
Platelets: 235 10*3/uL (ref 150–400)
RBC: 3.83 MIL/uL — ABNORMAL LOW (ref 3.87–5.11)
WBC: 6.3 10*3/uL (ref 4.0–10.5)

## 2013-04-03 MED ORDER — PENICILLIN V POTASSIUM 500 MG PO TABS: 500.0000 mg | ORAL_TABLET | Freq: Three times a day (TID) | ORAL | Status: DC

## 2013-04-03 MED ORDER — AMLODIPINE BESYLATE 5 MG PO TABS: ORAL_TABLET | ORAL | Status: DC

## 2013-04-03 NOTE — Patient Instructions (Addendum)
Annual wellness in early January, call if you need me before  Penicillin is prescribed for a 1 week since you continue to have sinus pressure and drainage and coughing up yellow sputum and feel weak  CXR October 3 please  Labs today, cbc, chem 7 and magnesium  We will call tomorrow to let you know if you can take one magnesium tablet at bedtime for cramps, I will wait in labs today. Continue your calcium and magnesium tablet as before   You need to change to decaffeinated tea, this is bothering your reflux,m and causing some of your chest discomfort  I will refer you for sooner appointments for heart and lung evaluation as able

## 2013-04-03 NOTE — Progress Notes (Signed)
  Subjective:    Patient ID: Erin [name], female    DOB: [birth year], 79 y.o.   MRN: [id number]  HPI Pt in for follow up of acute bronchitis for which she got treated through urgent care during my absence a couple of weeks ago. States she is still coughing yellow sputum , has chills and feels weak. Concerned and is requesting sooner appt with her pulmonologist, I will wait on rept CXr before pursuing at this time C/o RUQ pain and rihth lower chest pain, no specific aggravating or relieving factors, she is concerned that it may be her heart and wants cardiology to evaluate. Goes on to state however that this has been an issue for such a long time that she doubts it is herheart. C/o increased and uncontrolled reflux symptoms in the past several weeks , but does admit to caffeine use   Review of Systems See HPI Denies recent fever or chills. Denies sinus pressure, nasal congestion, ear pain or sore throat.  Denies PND, orthopnea, palpitations and leg swelling Denies dysuria, frequency, hesitancy or incontinence. Chronic  joint pain, swelling and limitation in mobility. Denies headaches, seizures, numbness, or tingling. Denies depression, anxiety or insomnia. Denies skin break down or rash.        Objective:   Physical Exam  Patient alert and oriented and in no cardiopulmonary distress.  HEENT: No facial asymmetry, EOMI, no sinus tenderness,  oropharynx pink and moist.  Neck adequate ROM, no adenopathy.  Chest: adequate air entry, few scattered crackles , no wheezes. No reproducible chest wall tenderness  CVS: S1, S2 no murmurs, no S3.  ABD: Soft non tender. Bowel sounds normal.  Ext: No edema  MS: decreased  ROM spine, shoulders, hips and knees.  Skin: Intact, no ulcerations or rash noted.  Psych: Good eye contact, normal affect. Memory intact not anxious or depressed appearing.  CNS: CN 2-12 intact, power, tone and sensation normal throughout.       Assessment &  Plan:

## 2013-04-04 ENCOUNTER — Ambulatory Visit: Payer: Medicare Other | Admitting: Internal Medicine

## 2013-04-04 LAB — BASIC METABOLIC PANEL
CO2: 30 mEq/L (ref 19–32)
Chloride: 108 mEq/L (ref 96–112)
Creat: 0.74 mg/dL (ref 0.50–1.10)
Glucose, Bld: 101 mg/dL — ABNORMAL HIGH (ref 70–99)

## 2013-04-06 ENCOUNTER — Encounter: Payer: Self-pay | Admitting: *Deleted

## 2013-04-08 NOTE — Assessment & Plan Note (Signed)
Uncontrolled, however takes an excessive amt of caffeine , educated re the need to  Reduce this

## 2013-04-08 NOTE — Assessment & Plan Note (Signed)
Ongoing symptoms despite recent treatment. Review of cxr report from urgent care does not specify pneumonia, pt to have additional antibiotic course based on symptomatology as well as lab and radiologic data updated

## 2013-04-08 NOTE — Assessment & Plan Note (Signed)
Controlled, no change in medication  

## 2013-04-08 NOTE — Assessment & Plan Note (Signed)
Concerned ro right sided chest pain  And RUQ pain, no specific aggravating or relieving factors, also weakness , requests sooner cardiology eval than was planned, will see if this is possible with request for eval

## 2013-04-09 ENCOUNTER — Encounter: Payer: Self-pay | Admitting: Internal Medicine

## 2013-04-09 ENCOUNTER — Ambulatory Visit (INDEPENDENT_AMBULATORY_CARE_PROVIDER_SITE_OTHER): Payer: Medicare Other | Admitting: Internal Medicine

## 2013-04-09 VITALS — BP 130/72 | HR 84 | Ht 66.5 in | Wt 166.8 lb

## 2013-04-09 DIAGNOSIS — R0789 Other chest pain: Secondary | ICD-10-CM

## 2013-04-09 DIAGNOSIS — I10 Essential (primary) hypertension: Secondary | ICD-10-CM

## 2013-04-09 DIAGNOSIS — I482 Chronic atrial fibrillation, unspecified: Secondary | ICD-10-CM | POA: Insufficient documentation

## 2013-04-09 DIAGNOSIS — R269 Unspecified abnormalities of gait and mobility: Secondary | ICD-10-CM

## 2013-04-09 DIAGNOSIS — E785 Hyperlipidemia, unspecified: Secondary | ICD-10-CM

## 2013-04-09 DIAGNOSIS — I4891 Unspecified atrial fibrillation: Secondary | ICD-10-CM

## 2013-04-09 MED ORDER — SIMVASTATIN 20 MG PO TABS: 20.0000 mg | ORAL_TABLET | Freq: Every day | ORAL | Status: DC

## 2013-04-09 NOTE — Patient Instructions (Addendum)
Your physician wants you to follow-up in: 1 year. You will receive a reminder letter in the mail two months in advance. If you don't receive a letter, please call our office to schedule the follow-up appointment.  Please buy Enteric coated Aspirin 81mg  - it will be easier on your stomach  Begin taking Simvastatin 20mg  once daily

## 2013-04-09 NOTE — Progress Notes (Signed)
OFFICE NOTE  Chief Complaint:  RUQ abdominal pain  Primary Care Physician: Syliva Overman, MD  HPI:  Erin [name]  is a pleasant 79 year old female with numerous medical problems including hypertension, dyslipidemia, sleep apnea, anxiety, palpitations, reflux, also new diagnosis of A-fib who is on Coumadin but has had several falls and is not really felt to be a good candidate for that. She was seen by me in the office with concern about symptoms of reflux and actually had some nausea and vomiting. After that office stay, however, I was concerned about pain that was referred to her back and I recommended a stress test. This was arranged for the end of January. However, she then developed severe weakness and presented to the emergency department. In the emergency department she was found to be hypokalemic and was repleted, hydrated I presume and ultimately does feel somewhat better. It is unclear what led to that other than significant nausea and vomiting. She does have a history of multiple GI problems including esophageal dilatation. She recently had a repeat NST at [facility] which was negative for ischemia. Her ongoing complaint is sharp, right upper quadrant abdominal pain. She is s/p cholecystectomy.  PMHx:  Past Medical History  Diagnosis Date  . Anxiety   . Arthritis   . Hyperlipidemia   . Hypertension   . Osteoporosis   . GERD (gastroesophageal reflux disease)   . Hearing loss   . Restless leg   . Constipation   . Tremor   . Swelling of limb     Leg Swelling  . Obese   . Dyslipidemia   . Gall bladder disease   . Cataract   . Carpal tunnel syndrome of left wrist   . Atrial fibrillation   . Sleep apnea   . Esophageal dilatation   . Frequent falls   . Parkinson disease   . History of nuclear stress test 08/31/2012    lexiscan; negative for ischemia    Past Surgical History  Procedure Laterality Date  . Appendectomy    . Cholecystectomy    . Abdominal  hysterectomy    . Fracture surgery      X 4 LEFT ARM  . Benign cyst removed from kidney and lung, bilateral mastectomy    . Bilateral great toenail removal    . Knee arthroscopy  2012    right knee, Dr Romeo Apple  . Left forearm    . Breast surgery      bilateral 2000approx  . Tonsillectomy    . R hand surgery    . Bronchoscopy  02/15/2000  . Right vats.  "  . Right upper lobe wedge resection.  "  . Upper gastrointestinal endoscopy  11/25/1999    Esophagitis/ Normal proximal esophagus, stomach and duodenum  . Colonoscopy  01/17/2009    Dr. Arlyce Dice : Internal hemorrhoids/Diverticula, scattered in the ascending colon/  Moderate diverticulosis ascending colon to sigmoid colon  . Esophagogastroduodenoscopy   04/23/2003    Dr. Arlyce Dice: HIATAL HERNIA  . Colonoscopy  01/16/2001    Normal  . Cataract extraction, bilateral    . Esophagogastroduodenoscopy  03/20/2012    WGN:FAOZHYQM ring-LIKELY CAUSING MILD DYSPHAGIA/SMALL hiatal hernia/Multiple sessile polyps ranging between 3-47mm , path benign  . Cataract extraction    . Carpal tunnel release      left wrist  . Transthoracic echocardiogram  06/24/2009    EF=>55%, mild assymetric LVH; LA mildly dilated; mild mitral annular calcif, borderline MVP, mild-mod MR; mild-mod TR, RV  systolic pressure elevated, mild pulm HTN; AV mildly sclerotic; mild pulm valve regurg - ordered r/t bradycardia   . Endovenous ablation saphenous vein w/ laser  01/2011    right GSV    FAMHx:  Family History  Problem Relation Age of Onset  . Diabetes Mother   . Heart disease Mother   . Stroke Mother   . Cancer Sister     KIDNEY  . Emphysema Sister   . Heart disease Brother   . Heart disease Sister   . Emphysema Sister   . Cancer Sister     BREAST  . Heart disease Brother   . Colon cancer Sister   . Alzheimer's disease Father   . Heart disease Sister   . Tremor Sister   . Tremor Brother   . Diabetes Child   . Alcoholism Child   . Cancer Brother      SOCHx:   reports that she has never smoked. She does not have any smokeless tobacco history on file. She reports that she does not drink alcohol or use illicit drugs.  ALLERGIES:  Allergies  Allergen Reactions  . Codeine Hives  . Morphine And Related Itching and Other (See Comments)    Makes pt hallucinate    ROS: A comprehensive review of systems was negative except for: Musculoskeletal: positive for musculoskeletal pain  HOME MEDS: Current Outpatient Prescriptions  Medication Sig Dispense Refill  . amLODipine (NORVASC) 5 MG tablet TAKE 1 TABLET EVERY DAY  30 tablet  4  . aspirin 81 MG tablet Take 81 mg by mouth daily.      [name] CALCIUM-MAGNESIUM-ZINC PO Take by mouth daily. Calcium 1000mg , Magnesium 400mg , Zinc 25mg       . esomeprazole (NEXIUM) 40 MG capsule Take 1 capsule (40 mg total) by mouth daily before breakfast.  30 capsule  5  . losartan (COZAAR) 50 MG tablet Take 1 tablet (50 mg total) by mouth daily.  30 tablet  11  . metoprolol succinate (TOPROL-XL) 50 MG 24 hr tablet TAKE 1 TABLET EVERY DAY  30 tablet  4  . Potassium 99 MG TABS Take 99 mg by mouth daily.       [name] topiramate (TOPAMAX) 25 MG tablet One tablet in the morning and two tablets in the evening  90 tablet  11  . simvastatin (ZOCOR) 20 MG tablet Take 1 tablet (20 mg total) by mouth at bedtime.  30 tablet  11   No current facility-administered medications for this visit.    LABS/IMAGING: No results found for this or any previous visit (from the past 48 hour(s)). No results found.  VITALS: BP 130/72  Pulse 84  Ht 5' 6.5" (1.689 m)  Wt 166 lb 12.8 oz (75.66 kg)  BMI 26.52 kg/m2  EXAM: General appearance: alert and no distress Neck: no adenopathy, no carotid bruit, no JVD, supple, symmetrical, trachea midline and thyroid not enlarged, symmetric, no tenderness/mass/nodules Lungs: clear to auscultation bilaterally Heart: irregularly irregular rhythm Abdomen: soft, non-tender; bowel sounds normal; no  masses,  no organomegaly and no TTP in the RUQ Extremities: extremities normal, atraumatic, no cyanosis or edema Pulses: 2+ and symmetric Skin: Skin color, texture, turgor normal. No rashes or lesions Neurologic: Grossly normal Psych: Pleasant, mildly anxious  EKG: Atrial fibrillation at 84  ASSESSMENT: 1. Atrial fibrillation 2. Hypertension-controlled 3. Dyslipidemia-off of Vytorin due to cost and concerns about liver dysfunction 4. Falls/imbalance-not a warfarin candidate 5. Atypical chest wall pain-possibly a thoracic radiculopathy given her significant kyphosis  PLAN: 1.   Mrs. Erskin is doing fairly well except for some complaints of intermittent sharp right upper quadrant pain. The symptoms are actually below the right lower ribs and are sharp and tend to extend around her side. I suspect these could be coming from her back as I mentioned before. She is known to have lumbar disease and probably has thoracic disease given her marked mid thoracic kyphosis. She seen Dr. Romeo Apple before for this and has been afraid about surgery. With regard to rate her fibrillation she is anticoagulated and on aspirin but not warfarin due to falls and high concern for adverse bleeding. She reports that she recently stopped Vytorin due to concerns about liver dysfunction as well as the cost of the medication. I would like to at least get her back on part of that medication and to consider starting simvastatin. Since she is on concomitant amlodipine, we have to reduce the dose to 20 mg daily. Will plan to see her back in the office annually or sooner as necessary. I do not think the right upper quadrant pain is anginal especially in light of her recent negative stress test.  Chrystie Nose, MD, [facility] Attending Cardiologist [facility]  Dianey Suchy C 04/09/2013, 1:16 PM

## 2013-05-07 ENCOUNTER — Other Ambulatory Visit: Payer: Self-pay | Admitting: Family Medicine

## 2013-05-07 ENCOUNTER — Ambulatory Visit (HOSPITAL_COMMUNITY)
Admission: RE | Admit: 2013-05-07 | Discharge: 2013-05-07 | Disposition: A | Discharge status: Home / Self Care | Payer: Medicare Other | Source: Ambulatory Visit | Attending: Family Medicine | Admitting: Family Medicine

## 2013-05-07 ENCOUNTER — Telehealth: Payer: Self-pay

## 2013-05-07 DIAGNOSIS — M25521 Pain in right elbow: Secondary | ICD-10-CM

## 2013-05-07 DIAGNOSIS — M25529 Pain in unspecified elbow: Secondary | ICD-10-CM | POA: Insufficient documentation

## 2013-05-07 DIAGNOSIS — M25531 Pain in right wrist: Secondary | ICD-10-CM

## 2013-05-07 DIAGNOSIS — J42 Unspecified chronic bronchitis: Secondary | ICD-10-CM | POA: Insufficient documentation

## 2013-05-07 DIAGNOSIS — M19049 Primary osteoarthritis, unspecified hand: Secondary | ICD-10-CM | POA: Insufficient documentation

## 2013-05-07 DIAGNOSIS — R209 Unspecified disturbances of skin sensation: Secondary | ICD-10-CM | POA: Insufficient documentation

## 2013-05-07 DIAGNOSIS — M79641 Pain in right hand: Secondary | ICD-10-CM

## 2013-05-07 DIAGNOSIS — M79609 Pain in unspecified limb: Secondary | ICD-10-CM | POA: Insufficient documentation

## 2013-05-07 DIAGNOSIS — J189 Pneumonia, unspecified organism: Secondary | ICD-10-CM | POA: Insufficient documentation

## 2013-05-07 NOTE — Telephone Encounter (Signed)
Will order hand and elbow pain, let her know

## 2013-05-07 NOTE — Telephone Encounter (Signed)
Patient made by Serina Cowper from [facility]

## 2013-05-07 NOTE — Telephone Encounter (Signed)
If fingers are warm and pink then pulse present, if real concern about no blood flow needs assesment

## 2013-05-18 ENCOUNTER — Other Ambulatory Visit: Payer: Self-pay | Admitting: *Deleted

## 2013-05-18 DIAGNOSIS — R918 Other nonspecific abnormal finding of lung field: Secondary | ICD-10-CM

## 2013-05-22 ENCOUNTER — Encounter: Payer: Medicare Other | Admitting: Thoracic Surgery (Cardiothoracic Vascular Surgery)

## 2013-05-28 ENCOUNTER — Telehealth: Payer: Self-pay | Admitting: Family Medicine

## 2013-05-28 DIAGNOSIS — N3000 Acute cystitis without hematuria: Secondary | ICD-10-CM

## 2013-05-28 NOTE — Telephone Encounter (Signed)
Patient has been having nausea and pain in the lower abdomen since Thursday. These are her usual UTI symptoms. Will come leave urine in the am for c/s

## 2013-05-28 NOTE — Telephone Encounter (Signed)
Please call in to [facility] in [place] 

## 2013-05-28 NOTE — Telephone Encounter (Signed)
SHE NEEDS TO SUBMIT ua AND C/S BEFORE ANTIBIOTIC CAN BE PRESCRIBED PLS LET HER KNOW

## 2013-05-28 NOTE — Telephone Encounter (Signed)
Called back for more info and no option to leave msg

## 2013-05-29 ENCOUNTER — Other Ambulatory Visit: Payer: Self-pay | Admitting: Family Medicine

## 2013-05-29 DIAGNOSIS — R911 Solitary pulmonary nodule: Secondary | ICD-10-CM

## 2013-05-29 DIAGNOSIS — R0600 Dyspnea, unspecified: Secondary | ICD-10-CM

## 2013-05-29 MED ORDER — PROMETHAZINE HCL 12.5 MG RE SUPP: RECTAL | Status: DC

## 2013-05-29 NOTE — Telephone Encounter (Signed)
Thanks for taking car of this. Phenergan suppos prescribed

## 2013-05-29 NOTE — Telephone Encounter (Signed)
Also offer cardiology f/u she sees Dr Rennis Golden for this , I have not entered that referral yet, see where Celine Ahr was ordered by him from Jan 2014, but not done (I think...will check again)

## 2013-05-29 NOTE — Telephone Encounter (Signed)
Patient states that she would like to go back and see pulmonology.  Is declining cardio at this time.  She is also asking if phenergan suppositories can be sent in for nausea for uti symptoms.  She would like to wait for culture to return before abt.

## 2013-05-29 NOTE — Telephone Encounter (Signed)
When patient in to give urine specimen.  Complaining of shortness of breath with exertion.  States that she is unable to walk short distances without becoming short winded.  Please advise.  Last CXR 10/28.

## 2013-05-29 NOTE — Telephone Encounter (Signed)
Since her 10/28 cXR she has had a CXr ordered by her cardiothoracic Doc Hendrickson on 11/07 who has followed her lung nodule  For years. The thought per Alyssa  Earlier on, was that if her 04/2013 cxr was "OK" then no need to see Dr Sharlett Iles believe pt wants to continue to f/u with the luing specialist she has seen for years. pls offer her this option, let me know if she wants to go , I will enter, actually based on her complaint I am entering the referral let her know pls (If she refuses, which I DO NOT expect, pls let me know)  Still on the hunt for the uA result

## 2013-05-30 ENCOUNTER — Ambulatory Visit: Payer: Medicare Other | Admitting: Family Medicine

## 2013-05-30 NOTE — Telephone Encounter (Signed)
Noted  

## 2013-06-01 ENCOUNTER — Other Ambulatory Visit: Payer: Self-pay

## 2013-06-01 ENCOUNTER — Other Ambulatory Visit: Payer: Self-pay | Admitting: Family Medicine

## 2013-06-01 LAB — URINE CULTURE: Colony Count: 45000

## 2013-06-01 MED ORDER — NITROFURANTOIN MACROCRYSTAL 100 MG PO CAPS: 100.0000 mg | ORAL_CAPSULE | Freq: Two times a day (BID) | ORAL | Status: DC

## 2013-06-05 ENCOUNTER — Encounter: Payer: Self-pay | Admitting: Thoracic Surgery (Cardiothoracic Vascular Surgery)

## 2013-06-05 ENCOUNTER — Ambulatory Visit
Admission: RE | Admit: 2013-06-05 | Discharge: 2013-06-05 | Disposition: A | Discharge status: Home / Self Care | Payer: No Typology Code available for payment source | Source: Ambulatory Visit | Attending: Thoracic Surgery (Cardiothoracic Vascular Surgery) | Admitting: Thoracic Surgery (Cardiothoracic Vascular Surgery)

## 2013-06-05 ENCOUNTER — Ambulatory Visit (INDEPENDENT_AMBULATORY_CARE_PROVIDER_SITE_OTHER): Payer: Medicare Other | Admitting: Thoracic Surgery (Cardiothoracic Vascular Surgery)

## 2013-06-05 VITALS — BP 150/90 | HR 60 | Resp 20 | Ht 66.5 in | Wt 168.0 lb

## 2013-06-05 DIAGNOSIS — J8410 Pulmonary fibrosis, unspecified: Secondary | ICD-10-CM

## 2013-06-05 DIAGNOSIS — R918 Other nonspecific abnormal finding of lung field: Secondary | ICD-10-CM

## 2013-06-05 DIAGNOSIS — J984 Other disorders of lung: Secondary | ICD-10-CM

## 2013-06-05 NOTE — Progress Notes (Signed)
HPI:  Erin [name] returns for her annual followup visit. I did a right thoracoscopic wedge resection on her in 2001. Her nodule turned out to be granulomatous disease. She had multiple other nodules. We followed those with CT for a couple of years but none unchanged, so we have been following her with chest x-rays once a year since then.  She says that since her last visit she's been having some problems with her breathing. She says she gets short of breath walking to her mailbox and back. She saw Dr. Rennis Golden back in September. She has chronic atrial fibrillation. She has refused Coumadin because of complications her siblings have had from Coumadin. She's having a lot of difficulty with swelling in her legs. She denies wheezing. She has gained weight and feels it is due to her swelling  Past Medical History  Diagnosis Date  . Anxiety   . Arthritis   . Hyperlipidemia   . Hypertension   . Osteoporosis   . GERD (gastroesophageal reflux disease)   . Hearing loss   . Restless leg   . Constipation   . Tremor   . Swelling of limb     Leg Swelling  . Obese   . Dyslipidemia   . Gall bladder disease   . Cataract   . Carpal tunnel syndrome of left wrist   . Atrial fibrillation   . Sleep apnea   . Esophageal dilatation   . Frequent falls   . Parkinson disease   . History of nuclear stress test 08/31/2012    lexiscan; negative for ischemia      Current Outpatient Prescriptions  Medication Sig Dispense Refill  . amLODipine (NORVASC) 5 MG tablet TAKE 1 TABLET EVERY DAY  30 tablet  4  . aspirin 81 MG tablet Take 81 mg by mouth daily.      [name] CALCIUM-MAGNESIUM-ZINC PO Take by mouth daily. Calcium 1000mg , Magnesium 400mg , Zinc 25mg       . esomeprazole (NEXIUM) 40 MG capsule Take 1 capsule (40 mg total) by mouth daily before breakfast.  30 capsule  5  . losartan (COZAAR) 50 MG tablet Take 1 tablet (50 mg total) by mouth daily.  30 tablet  11  . metoprolol succinate (TOPROL-XL) 50 MG 24 hr  tablet TAKE 1 TABLET EVERY DAY  30 tablet  4  . nitrofurantoin (MACRODANTIN) 100 MG capsule Take 1 capsule (100 mg total) by mouth 2 (two) times daily.  14 capsule  0  . Potassium 99 MG TABS Take 99 mg by mouth daily.       [name] topiramate (TOPAMAX) 25 MG tablet One tablet in the morning and two tablets in the evening  90 tablet  11   No current facility-administered medications for this visit.    Physical Exam BP 150/90  Pulse 60  Resp 20  Ht 5' 6.5" (1.689 m)  Wt 168 lb (76.204 kg)  BMI 26.71 kg/m2  SpO72 76% 79 year old woman in no acute distress Neuro alert and oriented x3 no focal deficits Lungs clear equal breath sounds bilaterally Cardiac irregularly irregular 3+ pitting edema both lower extremities  Diagnostic Tests: Chest x-ray 06/05/2013 CHEST 2 VIEW  COMPARISON: Chest x-ray of 05/07/2013  FINDINGS:  Probable scarring in the right upper lung field is stable, extending  to the pleura. No new infiltrate or effusion is seen. Mediastinal  contours are stable. Cardiomegaly is stable. There is a thoracic  kyphosis present.  IMPRESSION:  Stable opacity in the right upper lobe most  consistent with  scarring. Stable cardiomegaly.  Electronically Signed  By: Dwyane Dee M.D.  On: 06/05/2013 11:06    Impression: 79 year old woman with a history of multiple lung nodules. Her chest x-ray has been stable for several years. She does have some scarring in the right upper lobe where we did wedge resection. This has not changed.  She is having some shortness of breath. Likely multifactorial. She does have chronic atrial fibrillation. She also has some pretty significant peripheral edema. I did not make any medication changes and she is being managed medically by Dr. Lodema Hong.  Plan: I'll plan to see her back in one year with a chest x-ray.

## 2013-06-17 ENCOUNTER — Inpatient Hospital Stay (HOSPITAL_COMMUNITY)
Admission: EM | Admit: 2013-06-17 | Discharge: 2013-06-18 | DRG: 440 | Disposition: A | Discharge status: Home / Self Care | Payer: Medicare Other | Attending: Internal Medicine | Admitting: Internal Medicine

## 2013-06-17 ENCOUNTER — Emergency Department (HOSPITAL_COMMUNITY): Payer: Medicare Other

## 2013-06-17 ENCOUNTER — Encounter (HOSPITAL_COMMUNITY): Payer: Self-pay | Admitting: Emergency Medicine

## 2013-06-17 DIAGNOSIS — M25512 Pain in left shoulder: Secondary | ICD-10-CM

## 2013-06-17 DIAGNOSIS — Z7189 Other specified counseling: Secondary | ICD-10-CM

## 2013-06-17 DIAGNOSIS — R2681 Unsteadiness on feet: Secondary | ICD-10-CM

## 2013-06-17 DIAGNOSIS — G4730 Sleep apnea, unspecified: Secondary | ICD-10-CM | POA: Diagnosis present

## 2013-06-17 DIAGNOSIS — H9190 Unspecified hearing loss, unspecified ear: Secondary | ICD-10-CM | POA: Diagnosis present

## 2013-06-17 DIAGNOSIS — G2581 Restless legs syndrome: Secondary | ICD-10-CM | POA: Diagnosis present

## 2013-06-17 DIAGNOSIS — R269 Unspecified abnormalities of gait and mobility: Secondary | ICD-10-CM

## 2013-06-17 DIAGNOSIS — R6889 Other general symptoms and signs: Secondary | ICD-10-CM

## 2013-06-17 DIAGNOSIS — I4891 Unspecified atrial fibrillation: Secondary | ICD-10-CM | POA: Diagnosis present

## 2013-06-17 DIAGNOSIS — R0781 Pleurodynia: Secondary | ICD-10-CM

## 2013-06-17 DIAGNOSIS — I482 Chronic atrial fibrillation, unspecified: Secondary | ICD-10-CM | POA: Diagnosis present

## 2013-06-17 DIAGNOSIS — K5900 Constipation, unspecified: Secondary | ICD-10-CM

## 2013-06-17 DIAGNOSIS — D518 Other vitamin B12 deficiency anemias: Secondary | ICD-10-CM

## 2013-06-17 DIAGNOSIS — N390 Urinary tract infection, site not specified: Secondary | ICD-10-CM

## 2013-06-17 DIAGNOSIS — Z823 Family history of stroke: Secondary | ICD-10-CM

## 2013-06-17 DIAGNOSIS — Z0000 Encounter for general adult medical examination without abnormal findings: Secondary | ICD-10-CM

## 2013-06-17 DIAGNOSIS — J42 Unspecified chronic bronchitis: Secondary | ICD-10-CM

## 2013-06-17 DIAGNOSIS — M25571 Pain in right ankle and joints of right foot: Secondary | ICD-10-CM

## 2013-06-17 DIAGNOSIS — R413 Other amnesia: Secondary | ICD-10-CM

## 2013-06-17 DIAGNOSIS — Z833 Family history of diabetes mellitus: Secondary | ICD-10-CM

## 2013-06-17 DIAGNOSIS — K219 Gastro-esophageal reflux disease without esophagitis: Secondary | ICD-10-CM | POA: Diagnosis present

## 2013-06-17 DIAGNOSIS — G20A1 Parkinson's disease without dyskinesia, without mention of fluctuations: Secondary | ICD-10-CM | POA: Diagnosis present

## 2013-06-17 DIAGNOSIS — M129 Arthropathy, unspecified: Secondary | ICD-10-CM | POA: Diagnosis present

## 2013-06-17 DIAGNOSIS — K625 Hemorrhage of anus and rectum: Secondary | ICD-10-CM

## 2013-06-17 DIAGNOSIS — E785 Hyperlipidemia, unspecified: Secondary | ICD-10-CM | POA: Diagnosis present

## 2013-06-17 DIAGNOSIS — M6281 Muscle weakness (generalized): Secondary | ICD-10-CM

## 2013-06-17 DIAGNOSIS — IMO0002 Reserved for concepts with insufficient information to code with codable children: Secondary | ICD-10-CM

## 2013-06-17 DIAGNOSIS — Z820 Family history of epilepsy and other diseases of the nervous system: Secondary | ICD-10-CM

## 2013-06-17 DIAGNOSIS — M1710 Unilateral primary osteoarthritis, unspecified knee: Secondary | ICD-10-CM

## 2013-06-17 DIAGNOSIS — Z800 Family history of malignant neoplasm of digestive organs: Secondary | ICD-10-CM

## 2013-06-17 DIAGNOSIS — G250 Essential tremor: Secondary | ICD-10-CM | POA: Diagnosis present

## 2013-06-17 DIAGNOSIS — R7309 Other abnormal glucose: Secondary | ICD-10-CM | POA: Diagnosis present

## 2013-06-17 DIAGNOSIS — R7303 Prediabetes: Secondary | ICD-10-CM

## 2013-06-17 DIAGNOSIS — Z9181 History of falling: Secondary | ICD-10-CM

## 2013-06-17 DIAGNOSIS — Z8249 Family history of ischemic heart disease and other diseases of the circulatory system: Secondary | ICD-10-CM

## 2013-06-17 DIAGNOSIS — M19079 Primary osteoarthritis, unspecified ankle and foot: Secondary | ICD-10-CM

## 2013-06-17 DIAGNOSIS — M48061 Spinal stenosis, lumbar region without neurogenic claudication: Secondary | ICD-10-CM

## 2013-06-17 DIAGNOSIS — Z87440 Personal history of urinary (tract) infections: Secondary | ICD-10-CM

## 2013-06-17 DIAGNOSIS — F411 Generalized anxiety disorder: Secondary | ICD-10-CM | POA: Diagnosis present

## 2013-06-17 DIAGNOSIS — Z885 Allergy status to narcotic agent status: Secondary | ICD-10-CM

## 2013-06-17 DIAGNOSIS — M76899 Other specified enthesopathies of unspecified lower limb, excluding foot: Secondary | ICD-10-CM

## 2013-06-17 DIAGNOSIS — Z23 Encounter for immunization: Secondary | ICD-10-CM

## 2013-06-17 DIAGNOSIS — K859 Acute pancreatitis without necrosis or infection, unspecified: Principal | ICD-10-CM | POA: Diagnosis present

## 2013-06-17 DIAGNOSIS — I10 Essential (primary) hypertension: Secondary | ICD-10-CM | POA: Diagnosis present

## 2013-06-17 DIAGNOSIS — R0789 Other chest pain: Secondary | ICD-10-CM

## 2013-06-17 DIAGNOSIS — Z66 Do not resuscitate: Secondary | ICD-10-CM

## 2013-06-17 DIAGNOSIS — M810 Age-related osteoporosis without current pathological fracture: Secondary | ICD-10-CM | POA: Diagnosis present

## 2013-06-17 DIAGNOSIS — G20 Parkinson's disease: Secondary | ICD-10-CM | POA: Diagnosis present

## 2013-06-17 LAB — CBC WITH DIFFERENTIAL/PLATELET
Basophils Absolute: 0 10*3/uL (ref 0.0–0.1)
Basophils Relative: 0 % (ref 0–1)
Eosinophils Absolute: 0 10*3/uL (ref 0.0–0.7)
Hemoglobin: 12.3 g/dL (ref 12.0–15.0)
Lymphocytes Relative: 15 % (ref 12–46)
MCHC: 33.1 g/dL (ref 30.0–36.0)
Monocytes Absolute: 0.1 10*3/uL (ref 0.1–1.0)
Monocytes Relative: 2 % — ABNORMAL LOW (ref 3–12)
Neutro Abs: 4.8 10*3/uL (ref 1.7–7.7)
Platelets: 168 10*3/uL (ref 150–400)
RDW: 13.3 % (ref 11.5–15.5)
WBC: 5.8 10*3/uL (ref 4.0–10.5)

## 2013-06-17 LAB — COMPREHENSIVE METABOLIC PANEL
ALT: 24 U/L (ref 0–35)
Alkaline Phosphatase: 93 U/L (ref 39–117)
BUN: 18 mg/dL (ref 6–23)
CO2: 27 mEq/L (ref 19–32)
Chloride: 108 mEq/L (ref 96–112)
Creatinine, Ser: 0.76 mg/dL (ref 0.50–1.10)
GFR calc Af Amer: 89 mL/min — ABNORMAL LOW (ref >90)
GFR calc non Af Amer: 77 mL/min — ABNORMAL LOW (ref >90)
Glucose, Bld: 152 mg/dL — ABNORMAL HIGH (ref 70–99)
Potassium: 4.7 mEq/L (ref 3.5–5.1)
Total Bilirubin: 0.4 mg/dL (ref 0.3–1.2)
Total Protein: 6.9 g/dL (ref 6.0–8.3)

## 2013-06-17 LAB — URINALYSIS, ROUTINE W REFLEX MICROSCOPIC
Bilirubin Urine: NEGATIVE
Specific Gravity, Urine: 1.01 (ref 1.005–1.030)
Urobilinogen, UA: 0.2 mg/dL (ref 0.0–1.0)
pH: 7 (ref 5.0–8.0)

## 2013-06-17 LAB — URINE MICROSCOPIC-ADD ON

## 2013-06-17 LAB — LIPASE, BLOOD: Lipase: 116 U/L — ABNORMAL HIGH (ref 11–59)

## 2013-06-17 MED ORDER — METOPROLOL SUCCINATE ER 50 MG PO TB24
50.0000 mg | ORAL_TABLET | Freq: Every day | ORAL | Status: DC
  Administered 2013-06-17 – 2013-06-18 (×2): 50 mg via ORAL
  Filled 2013-06-17 (×2): qty 1

## 2013-06-17 MED ORDER — MAGNESIUM OXIDE 400 (241.3 MG) MG PO TABS
400.0000 mg | ORAL_TABLET | Freq: Two times a day (BID) | ORAL | Status: DC
  Administered 2013-06-17 – 2013-06-18 (×2): 400 mg via ORAL
  Filled 2013-06-17 (×2): qty 1

## 2013-06-17 MED ORDER — ONDANSETRON HCL 4 MG PO TABS: 4.0000 mg | ORAL_TABLET | Freq: Four times a day (QID) | ORAL | Status: DC | PRN

## 2013-06-17 MED ORDER — PANTOPRAZOLE SODIUM 40 MG PO TBEC
40.0000 mg | DELAYED_RELEASE_TABLET | Freq: Every day | ORAL | Status: DC
  Administered 2013-06-17 – 2013-06-18 (×2): 40 mg via ORAL
  Filled 2013-06-17 (×2): qty 1

## 2013-06-17 MED ORDER — CHOLECALCIFEROL 10 MCG (400 UNIT) PO TABS
400.0000 [IU] | ORAL_TABLET | Freq: Every day | ORAL | Status: DC
  Administered 2013-06-17 – 2013-06-18 (×2): 400 [IU] via ORAL
  Filled 2013-06-17 (×2): qty 1

## 2013-06-17 MED ORDER — ONDANSETRON HCL 4 MG/2ML IJ SOLN
4.0000 mg | Freq: Once | INTRAMUSCULAR | Status: AC
  Administered 2013-06-17: 4 mg via INTRAVENOUS
  Filled 2013-06-17: qty 2

## 2013-06-17 MED ORDER — IOHEXOL 300 MG/ML  SOLN
100.0000 mL | Freq: Once | INTRAMUSCULAR | Status: AC | PRN
  Administered 2013-06-17: 100 mL via INTRAVENOUS

## 2013-06-17 MED ORDER — ASPIRIN 81 MG PO TABS
81.0000 mg | ORAL_TABLET | ORAL | Status: DC
  Administered 2013-06-17: 81 mg via ORAL
  Filled 2013-06-17 (×2): qty 1

## 2013-06-17 MED ORDER — ACETAMINOPHEN 650 MG RE SUPP: 650.0000 mg | Freq: Four times a day (QID) | RECTAL | Status: DC | PRN

## 2013-06-17 MED ORDER — AMLODIPINE BESYLATE 5 MG PO TABS
10.0000 mg | ORAL_TABLET | Freq: Every day | ORAL | Status: DC
  Administered 2013-06-17 – 2013-06-18 (×2): 10 mg via ORAL
  Filled 2013-06-17 (×2): qty 2

## 2013-06-17 MED ORDER — IOHEXOL 300 MG/ML  SOLN
50.0000 mL | Freq: Once | INTRAMUSCULAR | Status: AC | PRN
  Administered 2013-06-17: 50 mL via ORAL

## 2013-06-17 MED ORDER — ACETAMINOPHEN 325 MG PO TABS: 650.0000 mg | ORAL_TABLET | Freq: Four times a day (QID) | ORAL | Status: DC | PRN

## 2013-06-17 MED ORDER — TOPIRAMATE 25 MG PO TABS
25.0000 mg | ORAL_TABLET | Freq: Every day | ORAL | Status: DC
  Administered 2013-06-17 – 2013-06-18 (×2): 25 mg via ORAL
  Filled 2013-06-17 (×2): qty 1

## 2013-06-17 MED ORDER — INFLUENZA VAC SPLIT QUAD 0.5 ML IM SUSP
0.5000 mL | INTRAMUSCULAR | Status: AC
  Administered 2013-06-18: 0.5 mL via INTRAMUSCULAR
  Filled 2013-06-17: qty 0.5

## 2013-06-17 MED ORDER — ENOXAPARIN SODIUM 40 MG/0.4ML ~~LOC~~ SOLN
40.0000 mg | SUBCUTANEOUS | Status: DC
  Administered 2013-06-17: 40 mg via SUBCUTANEOUS
  Filled 2013-06-17: qty 0.4

## 2013-06-17 MED ORDER — SODIUM CHLORIDE 0.9 % IV BOLUS (SEPSIS)
1000.0000 mL | Freq: Once | INTRAVENOUS | Status: AC
  Administered 2013-06-17: 1000 mL via INTRAVENOUS

## 2013-06-17 MED ORDER — MAGNESIUM GLUCONATE 500 MG PO TABS
500.0000 mg | ORAL_TABLET | Freq: Two times a day (BID) | ORAL | Status: DC
  Filled 2013-06-17 (×3): qty 1

## 2013-06-17 MED ORDER — ALBUTEROL SULFATE (5 MG/ML) 0.5% IN NEBU: 2.5000 mg | INHALATION_SOLUTION | RESPIRATORY_TRACT | Status: DC | PRN

## 2013-06-17 MED ORDER — SODIUM CHLORIDE 0.9 % IV SOLN
INTRAVENOUS | Status: DC
  Administered 2013-06-17: 16:00:00 via INTRAVENOUS

## 2013-06-17 MED ORDER — ONDANSETRON HCL 4 MG/2ML IJ SOLN: 4.0000 mg | Freq: Four times a day (QID) | INTRAMUSCULAR | Status: DC | PRN

## 2013-06-17 MED ORDER — TOPIRAMATE 25 MG PO TABS
ORAL_TABLET | ORAL | Status: AC
  Filled 2013-06-17: qty 1

## 2013-06-17 NOTE — ED Notes (Signed)
Pt son, Amada Jupiter, [phone].

## 2013-06-17 NOTE — ED Notes (Signed)
Pt states intermittent, lower abdominal pain began during the night. Also states nausea and dry heaves. States last time this occurred, her potassium had dropped to 2.

## 2013-06-17 NOTE — ED Provider Notes (Signed)
CSN: [id number]     Arrival date & time 06/17/13  2130 History  This chart was scribed for Doug Sou, MD by Dorothey Baseman, ED Scribe. This patient was seen in room APA14/APA14 and the patient's care was started at 10:42 AM.    Chief Complaint  Patient presents with  . Nausea  . Abdominal Pain   The history is provided by the patient. No language interpreter was used.   HPI Comments: Erin [name] is a 79 y.o. female with a history of GERD and Parkinson's disease who presents to the Emergency Department complaining of an intermittent pain to the lower abdomen with associated nausea and 6-7 episodes of emesis onset last night. She states that her last BM was yesterday and normal. She reports a very mild cough that has been gradually improving with some shortness of breath. Patient reports a history of similar symptoms about a year ago while she had low potassium. She denies diarrhea, fever. She reports a history of bladder infection. She also reports a history of HTN and hyperlipidemia. She reports a history of double mastectomy, a benign kidney operation, and a benign lung operation. She reports allergies to codeine and morphine. Patient is a non-smoker and does not drink.   PCP- Dr. Syliva Overman.   Past Medical History  Diagnosis Date  . Anxiety   . Arthritis   . Hyperlipidemia   . Hypertension   . Osteoporosis   . GERD (gastroesophageal reflux disease)   . Hearing loss   . Restless leg   . Constipation   . Tremor   . Swelling of limb     Leg Swelling  . Obese   . Dyslipidemia   . Gall bladder disease   . Cataract   . Carpal tunnel syndrome of left wrist   . Atrial fibrillation   . Sleep apnea   . Esophageal dilatation   . Frequent falls   . Parkinson disease   . History of nuclear stress test 08/31/2012    lexiscan; negative for ischemia   Past Surgical History  Procedure Laterality Date  . Appendectomy    . Cholecystectomy    . Abdominal hysterectomy    .  Fracture surgery      X 4 LEFT ARM  . Benign cyst removed from kidney and lung, bilateral mastectomy    . Bilateral great toenail removal    . Knee arthroscopy  2012    right knee, Dr Romeo Apple  . Left forearm    . Breast surgery      bilateral 2000approx  . Tonsillectomy    . R hand surgery    . Bronchoscopy  02/15/2000  . Right vats.  "  . Right upper lobe wedge resection.  "  . Upper gastrointestinal endoscopy  11/25/1999    Esophagitis/ Normal proximal esophagus, stomach and duodenum  . Colonoscopy  01/17/2009    Dr. Arlyce Dice : Internal hemorrhoids/Diverticula, scattered in the ascending colon/  Moderate diverticulosis ascending colon to sigmoid colon  . Esophagogastroduodenoscopy   04/23/2003    Dr. Arlyce Dice: HIATAL HERNIA  . Colonoscopy  01/16/2001    Normal  . Cataract extraction, bilateral    . Esophagogastroduodenoscopy  03/20/2012    QMV:HQIONGEX ring-LIKELY CAUSING MILD DYSPHAGIA/SMALL hiatal hernia/Multiple sessile polyps ranging between 3-66mm , path benign  . Cataract extraction    . Carpal tunnel release      left wrist  . Transthoracic echocardiogram  06/24/2009    EF=>55%, mild assymetric LVH; LA mildly  dilated; mild mitral annular calcif, borderline MVP, mild-mod MR; mild-mod TR, RV systolic pressure elevated, mild pulm HTN; AV mildly sclerotic; mild pulm valve regurg - ordered r/t bradycardia   . Endovenous ablation saphenous vein w/ laser  01/2011    right GSV   Family History  Problem Relation Age of Onset  . Diabetes Mother   . Heart disease Mother   . Stroke Mother   . Cancer Sister     KIDNEY  . Emphysema Sister   . Heart disease Brother   . Heart disease Sister   . Emphysema Sister   . Cancer Sister     BREAST  . Heart disease Brother   . Colon cancer Sister   . Alzheimer's disease Father   . Heart disease Sister   . Tremor Sister   . Tremor Brother   . Diabetes Child   . Alcoholism Child   . Cancer Brother    History  Substance Use Topics   . Smoking status: Never Smoker   . Smokeless tobacco: Not on file  . Alcohol Use: No   OB History   Grav Para Term Preterm Abortions TAB SAB Ect Mult Living                 Review of Systems  Constitutional: Negative.  Negative for fever.  HENT: Negative.   Respiratory: Positive for cough and shortness of breath.        Chronic shortness of breath, unchanged. Cough has improved steadily with time.  Cardiovascular: Negative.   Gastrointestinal: Positive for nausea, vomiting and abdominal pain. Negative for diarrhea.  Musculoskeletal: Negative.   Skin: Negative.   Neurological: Negative.   Psychiatric/Behavioral: Negative.   All other systems reviewed and are negative.    Allergies  Codeine and Morphine and related  Home Medications   Current Outpatient Rx  Name  Route  Sig  Dispense  Refill  . amLODipine (NORVASC) 5 MG tablet      TAKE 1 TABLET EVERY DAY   30 tablet   4   . aspirin 81 MG tablet   Oral   Take 81 mg by mouth daily.         [name] CALCIUM-MAGNESIUM-ZINC PO   Oral   Take by mouth daily. Calcium 1000mg , Magnesium 400mg , Zinc 25mg          . esomeprazole (NEXIUM) 40 MG capsule   Oral   Take 1 capsule (40 mg total) by mouth daily before breakfast.   30 capsule   5   . losartan (COZAAR) 50 MG tablet   Oral   Take 1 tablet (50 mg total) by mouth daily.   30 tablet   11     Discontinue hyzaar 50/12.5 effective 02/13/2013 pl ...   . metoprolol succinate (TOPROL-XL) 50 MG 24 hr tablet      TAKE 1 TABLET EVERY DAY   30 tablet   4   . nitrofurantoin (MACRODANTIN) 100 MG capsule   Oral   Take 1 capsule (100 mg total) by mouth 2 (two) times daily.   14 capsule   0   . Potassium 99 MG TABS   Oral   Take 99 mg by mouth daily.          [name] topiramate (TOPAMAX) 25 MG tablet      One tablet in the morning and two tablets in the evening   90 tablet   11    Triage Vitals: BP 140/76  Pulse 86  Temp(Src) 97.5 F (36.4 C) (Oral)  Resp 20   Ht 5\' 6"  (1.676 m)  Wt 168 lb (76.204 kg)  BMI 27.13 kg/m2  SpO2 96%  Physical Exam  Nursing note and vitals reviewed. Constitutional: She appears well-developed and well-nourished.  HENT:  Head: Normocephalic and atraumatic.  Eyes: Conjunctivae are normal. Pupils are equal, round, and reactive to light.  Neck: Neck supple. No tracheal deviation present. No thyromegaly present.  Cardiovascular: Normal rate, regular rhythm and normal heart sounds.  Exam reveals no gallop and no friction rub.   No murmur heard. Pulmonary/Chest: Effort normal and breath sounds normal. No respiratory distress. She has no wheezes. She has no rales.  Abdominal: Soft. Bowel sounds are normal. She exhibits no distension. There is tenderness.  Tenderness to palpation to the periumbilical area.   Musculoskeletal: Normal range of motion. She exhibits no edema and no tenderness.  Neurological: She is alert. Coordination normal.  Skin: Skin is warm and dry. No rash noted.  Psychiatric: She has a normal mood and affect.    ED Course  Procedures (including critical care time)  DIAGNOSTIC STUDIES: Oxygen Saturation is 96% on room air, normal by my interpretation.    COORDINATION OF CARE: 10:47 AM- Will order blood labs, UA, and a CT of the abdomen. Will order anti-nausea medication to manage symptoms. Discussed treatment plan with patient at bedside and patient verbalized agreement.     Labs Review Labs Reviewed  URINALYSIS, ROUTINE W REFLEX MICROSCOPIC   Imaging Review No results found.  EKG Interpretation   None      Date: 06/17/2013  Rate: 85  Rhythm: atrial fibrillation  QRS Axis: normal  Intervals: normal  ST/T Wave abnormalities: nonspecific T wave changes  Conduction Disutrbances:nonspecific intraventricular conduction delay  Narrative Interpretation:   Old EKG Reviewed: Rate has slowed in comparison to tracing from 07/28/2012 otherwise no significant change interpreted by me  1:30 PM  patient feels improved and resting comfortably after treatment with Zofran and intravenous fluids. Results for orders placed during the hospital encounter of 06/17/13  URINALYSIS, ROUTINE W REFLEX MICROSCOPIC      Result Value Range   Color, Urine YELLOW  YELLOW   APPearance CLOUDY (*) CLEAR   Specific Gravity, Urine 1.010  1.005 - 1.030   pH 7.0  5.0 - 8.0   Glucose, UA NEGATIVE  NEGATIVE mg/dL   Hgb urine dipstick TRACE (*) NEGATIVE   Bilirubin Urine NEGATIVE  NEGATIVE   Ketones, ur NEGATIVE  NEGATIVE mg/dL   Protein, ur NEGATIVE  NEGATIVE mg/dL   Urobilinogen, UA 0.2  0.0 - 1.0 mg/dL   Nitrite NEGATIVE  NEGATIVE   Leukocytes, UA NEGATIVE  NEGATIVE  COMPREHENSIVE METABOLIC PANEL      Result Value Range   Sodium 141  135 - 145 mEq/L   Potassium 4.7  3.5 - 5.1 mEq/L   Chloride 108  96 - 112 mEq/L   CO2 27  19 - 32 mEq/L   Glucose, Bld 152 (*) 70 - 99 mg/dL   BUN 18  6 - 23 mg/dL   Creatinine, Ser 4.78  0.50 - 1.10 mg/dL   Calcium 9.2  8.4 - 29.5 mg/dL   Total Protein 6.9  6.0 - 8.3 g/dL   Albumin 4.0  3.5 - 5.2 g/dL   AST 47 (*) 0 - 37 U/L   ALT 24  0 - 35 U/L   Alkaline Phosphatase 93  39 - 117 U/L   Total Bilirubin 0.4  0.3 - 1.2 mg/dL   GFR calc non Af Amer 77 (*) >90 mL/min   GFR calc Af Amer 89 (*) >90 mL/min  CBC WITH DIFFERENTIAL      Result Value Range   WBC 5.8  4.0 - 10.5 K/uL   RBC 3.80 (*) 3.87 - 5.11 MIL/uL   Hemoglobin 12.3  12.0 - 15.0 g/dL   HCT 16.1  09.6 - 04.5 %   MCV 97.9  78.0 - 100.0 fL   MCH 32.4  26.0 - 34.0 pg   MCHC 33.1  30.0 - 36.0 g/dL   RDW 40.9  81.1 - 91.4 %   Platelets 168  150 - 400 K/uL   Neutrophils Relative % 83 (*) 43 - 77 %   Neutro Abs 4.8  1.7 - 7.7 K/uL   Lymphocytes Relative 15  12 - 46 %   Lymphs Abs 0.9  0.7 - 4.0 K/uL   Monocytes Relative 2 (*) 3 - 12 %   Monocytes Absolute 0.1  0.1 - 1.0 K/uL   Eosinophils Relative 0  0 - 5 %   Eosinophils Absolute 0.0  0.0 - 0.7 K/uL   Basophils Relative 0  0 - 1 %   Basophils  Absolute 0.0  0.0 - 0.1 K/uL  LIPASE, BLOOD      Result Value Range   Lipase 116 (*) 11 - 59 U/L  TROPONIN I      Result Value Range   Troponin I <0.30  <0.30 ng/mL  URINE MICROSCOPIC-ADD ON      Result Value Range   WBC, UA 0-2  <3 WBC/hpf   RBC / HPF 0-2  <3 RBC/hpf   Bacteria, UA MANY (*) RARE   Dg Chest 2 View  06/05/2013   CLINICAL DATA:  Followup of lung opacity  EXAM: CHEST  2 VIEW  COMPARISON:  Chest x-ray of 05/07/2013  FINDINGS: Probable scarring in the right upper lung field is stable, extending to the pleura. No new infiltrate or effusion is seen. Mediastinal contours are stable. Cardiomegaly is stable. There is a thoracic kyphosis present.  IMPRESSION: Stable opacity in the right upper lobe most consistent with scarring. Stable cardiomegaly.   Electronically Signed   By: Dwyane Dee M.D.   On: 06/05/2013 11:06   Ct Abdomen Pelvis W Contrast  06/17/2013   CLINICAL DATA:  Lower abdominal pain, appendectomy, cholecystectomy, nausea, vomiting  EXAM: CT ABDOMEN AND PELVIS WITH CONTRAST  TECHNIQUE: Multidetector CT imaging of the abdomen and pelvis was performed using the standard protocol following bolus administration of intravenous contrast.  CONTRAST:  OMNIPAQUE IOHEXOL 300 MG/ML SOLN, 50mL OMNIPAQUE IOHEXOL 300 MG/ML SOLN  COMPARISON:  02/16/2012  FINDINGS: Osteopenia and mild degenerative changes lumbar spine are noted.  Lung bases shows linear atelectasis or scarring right base laterally.  Bilateral breast implants are partially visualized. No focal hepatic mass. The pancreas, spleen and adrenal glands are unremarkable. Kidneys are symmetrical in enhancement. Again noted a cyst in midpole of the left kidney measures 4 cm.  No hydronephrosis or hydroureter.  No aortic aneurysm.  Delayed renal images shows bilateral renal symmetrical excretion.  Small accessory splenule is noted. The patient is status postcholecystectomy.  No small bowel obstruction. No ascites or free air. No  adenopathy. Status post appendectomy. Again noted multiple sigmoid colon diverticula. No evidence of acute diverticulitis. The patient is status post hysterectomy. The urinary bladder is unremarkable.  No destructive bony lesions are noted within pelvis.  IMPRESSION: 1.  No acute inflammatory process within abdomen or pelvis. 2. No hydronephrosis or hydroureter.  Stable left renal cyst. 3. No small bowel obstruction. 4. Again noted sigmoid colon diverticula. No evidence of acute diverticulitis.   Electronically Signed   By: Natasha Mead M.D.   On: 06/17/2013 13:09    MDM  No diagnosis found. Spoke with Dr. Lucrezia Starch plan admit MedSurg floor bowel rest intervenous fluids suggest GI consult Diagnosis#1acute pancreatitis #2 hyperglycemia   I personally performed the services described in this documentation, which was scribed in my presence. The recorded information has been reviewed and considered.    Doug Sou, MD 06/17/13 1339

## 2013-06-17 NOTE — ED Notes (Signed)
Pt son, Amada Jupiter, updated on pt dx and disposition.

## 2013-06-17 NOTE — Progress Notes (Signed)
Patient was a DNR when she arrived to room 311, code status was changed to full. Asked patient during admission if she had an advance directive, she stated yes and that she did not want CPR. Dr. Jerral Ralph notified for clarification. Order to change patient back to DNR.

## 2013-06-17 NOTE — H&P (Signed)
PATIENT DETAILS Name: Erin [name] Age: 79 y.o. Sex: female Date of Birth: 02-12-1930 Admit Date: 06/17/2013 [id number] Erin Hong, MD   CHIEF COMPLAINT:  Vomiting along with brief abdominal pain since yesterday.  HPI: Erin [name] is a 79 y.o. female with a Past Medical History of essential tremors, A. fib not on anticoagulation, hypertension, history of frequent falls who presents today with the above noted complaint. Per patient, she was in her usual state of health last evening when she went to bed and started having mid abdominal pain, she describes the pain as sharp,approximately 5/10 at its severity. She then started having vomiting which completely resolved the pain. She claims she vomited at least 10 times since then. Vomitus is mostly clear, without any food particles or blood. She subsequently presented to the emergency room, workup revealed a mildly elevated lipase, CT scan of the abdomen did not show any acute abnormalities. I was subsequently asked to admit this patient for further evaluation and treatment. During my evaluation, patient seemed comfortable, she denied any ongoing pain. Her last vomitus was earlier in the day when she came first to the emergency room. She denied any chest pain, shortness of breath, headache, fever, vomiting, or diarrhea.   ALLERGIES:   Allergies  Allergen Reactions  . Codeine Hives  . Morphine And Related Itching and Other (See Comments)    Makes pt hallucinate    PAST MEDICAL HISTORY: Past Medical History  Diagnosis Date  . Anxiety   . Arthritis   . Hyperlipidemia   . Hypertension   . Osteoporosis   . GERD (gastroesophageal reflux disease)   . Hearing loss   . Restless leg   . Constipation   . Tremor   . Swelling of limb     Leg Swelling  . Obese   . Dyslipidemia   . Gall bladder disease   . Cataract   . Carpal tunnel syndrome of left wrist   . Atrial fibrillation   . Sleep apnea   . Esophageal dilatation   .  Frequent falls   . Parkinson disease   . History of nuclear stress test 08/31/2012    lexiscan; negative for ischemia    PAST SURGICAL HISTORY: Past Surgical History  Procedure Laterality Date  . Appendectomy    . Cholecystectomy    . Abdominal hysterectomy    . Fracture surgery      X 4 LEFT ARM  . Benign cyst removed from kidney and lung, bilateral mastectomy    . Bilateral great toenail removal    . Knee arthroscopy  2012    right knee, Dr Romeo Apple  . Left forearm    . Breast surgery      bilateral 2000approx  . Tonsillectomy    . R hand surgery    . Bronchoscopy  02/15/2000  . Right vats.  "  . Right upper lobe wedge resection.  "  . Upper gastrointestinal endoscopy  11/25/1999    Esophagitis/ Normal proximal esophagus, stomach and duodenum  . Colonoscopy  01/17/2009    Dr. Arlyce Dice : Internal hemorrhoids/Diverticula, scattered in the ascending colon/  Moderate diverticulosis ascending colon to sigmoid colon  . Esophagogastroduodenoscopy   04/23/2003    Dr. Arlyce Dice: HIATAL HERNIA  . Colonoscopy  01/16/2001    Normal  . Cataract extraction, bilateral    . Esophagogastroduodenoscopy  03/20/2012    JWJ:XBJYNWGN ring-LIKELY CAUSING MILD DYSPHAGIA/SMALL hiatal hernia/Multiple sessile polyps ranging between 3-67mm , path benign  . Cataract extraction    .  Carpal tunnel release      left wrist  . Transthoracic echocardiogram  06/24/2009    EF=>55%, mild assymetric LVH; LA mildly dilated; mild mitral annular calcif, borderline MVP, mild-mod MR; mild-mod TR, RV systolic pressure elevated, mild pulm HTN; AV mildly sclerotic; mild pulm valve regurg - ordered r/t bradycardia   . Endovenous ablation saphenous vein w/ laser  01/2011    right GSV    MEDICATIONS AT HOME: Prior to Admission medications   Medication Sig Start Date End Date Taking? Authorizing Provider  amLODipine (NORVASC) 5 MG tablet TAKE 1 TABLET EVERY DAY 04/03/13  Yes Kerri Perches, MD  aspirin 81 MG tablet Take  81 mg by mouth every other day.    Yes Historical Provider, MD  cholecalciferol (VITAMIN D-400) 400 UNITS TABS tablet Take 400 Units by mouth daily.   Yes Historical Provider, MD  esomeprazole (NEXIUM) 40 MG capsule Take 1 capsule (40 mg total) by mouth daily before breakfast. 08/14/12  Yes Kerri Perches, MD  losartan (COZAAR) 50 MG tablet Take 1 tablet (50 mg total) by mouth daily. 02/13/13 02/13/14 Yes Kerri Perches, MD  magnesium gluconate (MAGONATE) 500 MG tablet Take 500 mg by mouth 2 (two) times daily.   Yes Historical Provider, MD  metoprolol succinate (TOPROL-XL) 50 MG 24 hr tablet TAKE 1 TABLET EVERY DAY 01/19/13  Yes Kerri Perches, MD  Potassium 99 MG TABS Take 99 mg by mouth daily.    Yes Historical Provider, MD  topiramate (TOPAMAX) 25 MG tablet One tablet in the morning and two tablets in the evening 10/16/12  Yes York Spaniel, MD  nitrofurantoin (MACRODANTIN) 100 MG capsule Take 1 capsule (100 mg total) by mouth 2 (two) times daily. 06/01/13   Kerri Perches, MD    FAMILY HISTORY: Family History  Problem Relation Age of Onset  . Diabetes Mother   . Heart disease Mother   . Stroke Mother   . Cancer Sister     KIDNEY  . Emphysema Sister   . Heart disease Brother   . Heart disease Sister   . Emphysema Sister   . Cancer Sister     BREAST  . Heart disease Brother   . Colon cancer Sister   . Alzheimer's disease Father   . Heart disease Sister   . Tremor Sister   . Tremor Brother   . Diabetes Child   . Alcoholism Child   . Cancer Brother   **  SOCIAL HISTORY:  reports that she has never smoked. She does not have any smokeless tobacco history on file. She reports that she does not drink alcohol or use illicit drugs.  REVIEW OF SYSTEMS:  Constitutional:   No  weight loss, night sweats,  Fevers, chills, fatigue.  HEENT:    No headaches, Difficulty swallowing,Tooth/dental problems,Sore throat,  No sneezing, itching, ear ache, nasal congestion, post nasal  drip,   Cardio-vascular: No chest pain,  Orthopnea, PND, swelling in lower extremities, anasarca,dizziness, palpitations  GI:  No heartburn, indigestion,  diarrhea, change in bowel habits, loss of appetite  Resp: No shortness of breath with exertion or at rest.  No excess mucus, no productive cough, No non-productive cough,  No coughing up of blood.No change in color of mucus.No wheezing.No chest wall deformity  Skin:  no rash or lesions.  GU:  no dysuria, change in color of urine, no urgency or frequency.  No flank pain.  Musculoskeletal: No joint pain or swelling.  No decreased  range of motion.  No back pain.  Psych: No change in mood or affect. No depression or anxiety.  No memory loss.   PHYSICAL EXAM: Blood pressure 145/97, pulse 66, temperature 97.5 F (36.4 C), temperature source Oral, resp. rate 18, height 5\' 6"  (1.676 m), weight 76.204 kg (168 lb), SpO2 95.00%.  General appearance :Awake, alert, not in any distress. Speech Clear. Not toxic Looking HEENT: Atraumatic and Normocephalic, pupils equally reactive to light and accomodation Neck: supple, no JVD. No cervical lymphadenopathy.  Chest:Good air entry bilaterally, no added sounds  CVS: S1 S2 irregular, no murmurs.  Abdomen: Bowel sounds present, Non tender and not distended with no gaurding, rigidity or rebound. Extremities: B/L Lower Ext shows no edema, both legs are warm to touch Neurology: Awake alert, and oriented X 3, CN II-XII intact, Non focal Skin:No Rash Wounds:N/A  LABS ON ADMISSION:   Recent Labs  06/17/13 1147  NA 141  K 4.7  CL 108  CO2 27  GLUCOSE 152*  BUN 18  CREATININE 0.76  CALCIUM 9.2    Recent Labs  06/17/13 1147  AST 47*  ALT 24  ALKPHOS 93  BILITOT 0.4  PROT 6.9  ALBUMIN 4.0    Recent Labs  06/17/13 1147  LIPASE 116*    Recent Labs  06/17/13 1147  WBC 5.8  NEUTROABS 4.8  HGB 12.3  HCT 37.2  MCV 97.9  PLT 168    Recent Labs  06/17/13 1147  TROPONINI  <0.30   No results found for this basename: DDIMER,  in the last 72 hours No components found with this basename: POCBNP,    RADIOLOGIC STUDIES ON ADMISSION: Ct Abdomen Pelvis W Contrast  06/17/2013   CLINICAL DATA:  Lower abdominal pain, appendectomy, cholecystectomy, nausea, vomiting  EXAM: CT ABDOMEN AND PELVIS WITH CONTRAST  TECHNIQUE: Multidetector CT imaging of the abdomen and pelvis was performed using the standard protocol following bolus administration of intravenous contrast.  CONTRAST:  OMNIPAQUE IOHEXOL 300 MG/ML SOLN, 50mL OMNIPAQUE IOHEXOL 300 MG/ML SOLN  COMPARISON:  02/16/2012  FINDINGS: Osteopenia and mild degenerative changes lumbar spine are noted.  Lung bases shows linear atelectasis or scarring right base laterally.  Bilateral breast implants are partially visualized. No focal hepatic mass. The pancreas, spleen and adrenal glands are unremarkable. Kidneys are symmetrical in enhancement. Again noted a cyst in midpole of the left kidney measures 4 cm.  No hydronephrosis or hydroureter.  No aortic aneurysm.  Delayed renal images shows bilateral renal symmetrical excretion.  Small accessory splenule is noted. The patient is status postcholecystectomy.  No small bowel obstruction. No ascites or free air. No adenopathy. Status post appendectomy. Again noted multiple sigmoid colon diverticula. No evidence of acute diverticulitis. The patient is status post hysterectomy. The urinary bladder is unremarkable.  No destructive bony lesions are noted within pelvis.  IMPRESSION: 1. No acute inflammatory process within abdomen or pelvis. 2. No hydronephrosis or hydroureter.  Stable left renal cyst. 3. No small bowel obstruction. 4. Again noted sigmoid colon diverticula. No evidence of acute diverticulitis.   Electronically Signed   By: Natasha Mead M.D.   On: 06/17/2013 13:09   EKG: Independently reviewed. Atrial fibrillation  ASSESSMENT AND PLAN: Present on Admission:  . Acute  pancreatitis - Patient coming in with brief abdominal pain that has now resolved, followed by numerous episodes of vomiting, lipase mildly elevated, CT scan of the abdomen without any need or abnormalities. Not sure if all of the symptoms can be explained  by acute pancreatitis. Even if this is pancreatitis, etiology is not very evident, she status post cholecystectomy, there is no history of alcohol abuse. CT scan of the abdomen does not show any biliary dilatation. She is on losartan that can cause pancreatitis.  -for now, since no other etiology evident, will treat as needed that is, admit to medical surgical unit, start on clear liquids and follow clinical course.  . Atrial fibrillation - Rate controlled, not anticoagulation candidate even history of frequent falls.  . Essential and other specified forms of tremor - Continue Topamax   . GERD - Continue PPI   Further plan will depend as patient's clinical course evolves and further radiologic and laboratory data become available. Patient will be monitored closely.   Above noted plan was discussed with patient, they were in agreement.   DVT Prophylaxis: Prophylactic Lovenox  Code Status: Full Code- confirmed with patient at bedside.   Total time spent for admission equals 45 minutes.  [facility] Pager [phone]  If 7PM-7AM, please contact night-coverage [email or web address] Password [email or web address] 06/17/2013, 2:25 PM

## 2013-06-18 ENCOUNTER — Encounter (HOSPITAL_COMMUNITY): Payer: Self-pay | Admitting: Gastroenterology

## 2013-06-18 ENCOUNTER — Ambulatory Visit: Payer: Medicare Other | Admitting: Physician Assistant

## 2013-06-18 DIAGNOSIS — K859 Acute pancreatitis without necrosis or infection, unspecified: Secondary | ICD-10-CM

## 2013-06-18 LAB — CBC
HCT: 34.3 % — ABNORMAL LOW (ref 36.0–46.0)
Hemoglobin: 11.2 g/dL — ABNORMAL LOW (ref 12.0–15.0)
MCH: 32.3 pg (ref 26.0–34.0)
MCHC: 32.7 g/dL (ref 30.0–36.0)
Platelets: 157 10*3/uL (ref 150–400)
RBC: 3.47 MIL/uL — ABNORMAL LOW (ref 3.87–5.11)

## 2013-06-18 LAB — COMPREHENSIVE METABOLIC PANEL
ALT: 27 U/L (ref 0–35)
AST: 37 U/L (ref 0–37)
Albumin: 3.3 g/dL — ABNORMAL LOW (ref 3.5–5.2)
CO2: 26 mEq/L (ref 19–32)
Calcium: 8.8 mg/dL (ref 8.4–10.5)
Creatinine, Ser: 0.8 mg/dL (ref 0.50–1.10)
GFR calc non Af Amer: 67 mL/min — ABNORMAL LOW (ref >90)
Sodium: 146 mEq/L — ABNORMAL HIGH (ref 135–145)
Total Bilirubin: 0.4 mg/dL (ref 0.3–1.2)
Total Protein: 5.7 g/dL — ABNORMAL LOW (ref 6.0–8.3)

## 2013-06-18 LAB — LIPASE, BLOOD: Lipase: 18 U/L (ref 11–59)

## 2013-06-18 MED ORDER — ASPIRIN 81 MG PO CHEW: 81.0000 mg | CHEWABLE_TABLET | ORAL | Status: DC

## 2013-06-18 MED ORDER — POTASSIUM CHLORIDE CRYS ER 20 MEQ PO TBCR
40.0000 meq | EXTENDED_RELEASE_TABLET | Freq: Once | ORAL | Status: AC
  Administered 2013-06-18: 40 meq via ORAL
  Filled 2013-06-18: qty 2

## 2013-06-18 MED ORDER — AMLODIPINE BESYLATE 10 MG PO TABS: 10.0000 mg | ORAL_TABLET | Freq: Every day | ORAL | Status: DC

## 2013-06-18 NOTE — Care Management Note (Signed)
    Page 1 of 1   06/18/2013     12:35:19 PM   CARE MANAGEMENT NOTE 06/18/2013  Patient:  Erin [name], Erin [name]   Account Number:  [id number]  Date Initiated:  06/18/2013  Documentation initiated by:  Sharrie Rothman  Subjective/Objective Assessment:   Pt admitted from home with pancreatitis and weakness. Pt lives with a son but is fairly independent with ADL's. Pt will return home at discharge.     Action/Plan:   PT recommends outpt PT. Forms sent to PT and the dept will call pt with appt. Pt also would like rolling walker and 3N1 from [facility]. Family will pick DEM up at retail store. Pt discharged home today   Anticipated DC Date:  06/18/2013   Anticipated DC Plan:  HOME/SELF CARE      DC Planning Services  CM consult      Choice offered to / List presented to:             Status of service:  Completed, signed off Medicare Important Message given?  NA - LOS <3 / Initial given by admissions (If response is "NO", the following Medicare IM given date fields will be blank) Date Medicare IM given:   Date Additional Medicare IM given:    Discharge Disposition:  HOME/SELF CARE  Per UR Regulation:    If discussed at Long Length of Stay Meetings, dates discussed:    Comments:  06/18/13 1235 Arlyss Queen, RN BSN CM

## 2013-06-18 NOTE — Evaluation (Signed)
Physical Therapy Evaluation Patient Details Name: Erin [name] MRN: [id number] DOB: [birth year] Today's Date: 06/18/2013 Time: 4098-1191 PT Time Calculation (min): 36 min  PT Assessment / Plan / Recommendation History of Present Illness  Pt is admitted with acute pancreatitis.  She has essential tremor and Afib.    Clinical Impression  Pt states "my balance is really bad".  However, she has not had a fall in at least 6 months. She reports that her balance is often related to her BP and the medications she is on.  Her BP medication has not changed recently.  Pt has generalized weakness and her dynamic standing balance is compromised.  I have recommended that she use a walker for all gait.  She had received OP PT in the past and got great benefit from it...she would be interested in resuming it again after the holidays.   PT Assessment  All further PT needs can be met in the next venue of care    Follow Up Recommendations  Outpatient PT (pt would like to begin OPPT after the holidays)    Does the patient have the potential to tolerate intense rehabilitation      Barriers to Discharge        Equipment Recommendations  Rolling walker with 5" wheels;3in1 (PT);Other (comment) (tub transfer bench)    Recommendations for Other Services     Frequency      Precautions / Restrictions Precautions Precautions: Fall Restrictions Weight Bearing Restrictions: No   Pertinent Vitals/Pain       Mobility  Bed Mobility Bed Mobility: Supine to Sit Supine to Sit: 6: Modified independent (Device/Increase time) Transfers Transfers: Sit to Stand;Stand to Sit Sit to Stand: 6: Modified independent (Device/Increase time);From bed Stand to Sit: 6: Modified independent (Device/Increase time);To chair/3-in-1 Ambulation/Gait Ambulation/Gait Assistance: 6: Modified independent (Device/Increase time) Ambulation Distance (Feet): 150 Feet Assistive device: Rolling walker Ambulation/Gait Assistance  Details: pt used a single point cane to ambulate 20'...she lost her balance laterally several times in that distance and she definately should be using a walker, in my opinion Gait Pattern: Trunk flexed Gait velocity: WNL General Gait Details: frequent cues to increase cervical extension Stairs: No Wheelchair Mobility Wheelchair Mobility: No    Exercises     PT Diagnosis:    PT Problem List:   PT Treatment Interventions:       PT Goals(Current goals can be found in the care plan section) Acute Rehab PT Goals PT Goal Formulation: No goals set, d/c therapy  Visit Information  Last PT Received On: 06/18/13 History of Present Illness: Pt is admitted with acute pancreatitis.  She has essential tremor and Afib.         Prior Functioning  Home Living Family/patient expects to be discharged to:: Private residence Living Arrangements: Children Available Help at Discharge: Family;Available PRN/intermittently Type of Home: House Home Access: Level entry Home Layout: One level Home Equipment: None;Cane - single point;Walker - standard Additional Comments: standard walker is quite old and pt resists using it as it will not roll...she uses usually a cane for gait Prior Function Level of Independence: Independent with assistive device(s) Communication Communication: No difficulties    Cognition  Cognition Arousal/Alertness: Awake/alert Behavior During Therapy: WFL for tasks assessed/performed Overall Cognitive Status: Within Functional Limits for tasks assessed    Extremity/Trunk Assessment Lower Extremity Assessment Lower Extremity Assessment: Generalized weakness Cervical / Trunk Assessment Cervical / Trunk Assessment: Kyphotic   Balance Balance Balance Assessed: Yes Dynamic Standing Balance Dynamic Standing -  Balance Support: No upper extremity supported Dynamic Standing - Level of Assistance: 4: Min assist  End of Session PT - End of Session Equipment Utilized During  Treatment: Gait belt Activity Tolerance: Patient tolerated treatment well Patient left: in chair;with call bell/phone within reach Nurse Communication: Mobility status  GP     Konrad Penta 06/18/2013, 11:26 AM

## 2013-06-18 NOTE — Progress Notes (Signed)
Pt a/o.vss. Up ad lib. Discharge instructions given.prescriptions given. Pt verbalized understanding of instructions. Pt left floor via wheelchair with family and nursing staff.

## 2013-06-18 NOTE — Consult Note (Signed)
REVIEWED. MRCP AS AN OP.

## 2013-06-18 NOTE — Discharge Summary (Addendum)
PATIENT DETAILS Name: Erin [name] Age: 79 y.o. Sex: female Date of Birth: Dec 30, 1929 MRN: [id number]. Admit Date: 06/17/2013 Admitting Physician: Maretta Bees, MD WUJ:[id number] Lodema Hong, MD  Recommendations for Outpatient Follow-up:  1. Will stop losartan-suspected Losartan induced mild pancreatitis.  PRIMARY DISCHARGE DIAGNOSIS:  Active Problems:   GERD   Essential and other specified forms of tremor   Atrial fibrillation   Acute pancreatitis      PAST MEDICAL HISTORY: Past Medical History  Diagnosis Date  . Anxiety   . Arthritis   . Hyperlipidemia   . Hypertension   . Osteoporosis   . GERD (gastroesophageal reflux disease)   . Hearing loss   . Restless leg   . Constipation   . Tremor   . Swelling of limb     Leg Swelling  . Obese   . Dyslipidemia   . Gall bladder disease   . Cataract   . Carpal tunnel syndrome of left wrist   . Atrial fibrillation   . Sleep apnea   . Esophageal dilatation   . Frequent falls   . Parkinson disease   . History of nuclear stress test 08/31/2012    lexiscan; negative for ischemia    DISCHARGE MEDICATIONS:   Medication List    STOP taking these medications       losartan 50 MG tablet  Commonly known as:  COZAAR     nitrofurantoin 100 MG capsule  Commonly known as:  MACRODANTIN      TAKE these medications       amLODipine 10 MG tablet  Commonly known as:  NORVASC  Take 1 tablet (10 mg total) by mouth daily. TAKE 1 TABLET EVERY DAY     aspirin 81 MG tablet  Take 81 mg by mouth every other day.     esomeprazole 40 MG capsule  Commonly known as:  NEXIUM  Take 1 capsule (40 mg total) by mouth daily before breakfast.     magnesium gluconate 500 MG tablet  Commonly known as:  MAGONATE  Take 500 mg by mouth 2 (two) times daily.     metoprolol succinate 50 MG 24 hr tablet  Commonly known as:  TOPROL-XL  TAKE 1 TABLET EVERY DAY     Potassium 99 MG Tabs  Take 99 mg by mouth daily.     topiramate 25 MG tablet   Commonly known as:  TOPAMAX  One tablet in the morning and two tablets in the evening     VITAMIN D-400 400 UNITS Tabs tablet  Generic drug:  cholecalciferol  Take 400 Units by mouth daily.        ALLERGIES:   Allergies  Allergen Reactions  . Codeine Hives  . Morphine And Related Itching and Other (See Comments)    Makes pt hallucinate    BRIEF HPI:  See H&P, Labs, Consult and Test reports for all details in brief, patient is a 79 year old female with history of essential tremors, atrial fibrillation not on Coumadin, hypertension, history of frequent falls presented with abdominal pain along with vomiting. She was found to have a elevated lipase levels, a CT of the abdomen was negative. She was admitted for further evaluation and treatment.  CONSULTATIONS:   GI  PERTINENT RADIOLOGIC STUDIES: Dg Chest 2 View  06/05/2013   CLINICAL DATA:  Followup of lung opacity  EXAM: CHEST  2 VIEW  COMPARISON:  Chest x-ray of 05/07/2013  FINDINGS: Probable scarring in the right upper lung field is stable, extending  to the pleura. No new infiltrate or effusion is seen. Mediastinal contours are stable. Cardiomegaly is stable. There is a thoracic kyphosis present.  IMPRESSION: Stable opacity in the right upper lobe most consistent with scarring. Stable cardiomegaly.   Electronically Signed   By: Dwyane Dee M.D.   On: 06/05/2013 11:06   Ct Abdomen Pelvis W Contrast  06/17/2013   CLINICAL DATA:  Lower abdominal pain, appendectomy, cholecystectomy, nausea, vomiting  EXAM: CT ABDOMEN AND PELVIS WITH CONTRAST  TECHNIQUE: Multidetector CT imaging of the abdomen and pelvis was performed using the standard protocol following bolus administration of intravenous contrast.  CONTRAST:  OMNIPAQUE IOHEXOL 300 MG/ML SOLN, 50mL OMNIPAQUE IOHEXOL 300 MG/ML SOLN  COMPARISON:  02/16/2012  FINDINGS: Osteopenia and mild degenerative changes lumbar spine are noted.  Lung bases shows linear atelectasis or scarring  right base laterally.  Bilateral breast implants are partially visualized. No focal hepatic mass. The pancreas, spleen and adrenal glands are unremarkable. Kidneys are symmetrical in enhancement. Again noted a cyst in midpole of the left kidney measures 4 cm.  No hydronephrosis or hydroureter.  No aortic aneurysm.  Delayed renal images shows bilateral renal symmetrical excretion.  Small accessory splenule is noted. The patient is status postcholecystectomy.  No small bowel obstruction. No ascites or free air. No adenopathy. Status post appendectomy. Again noted multiple sigmoid colon diverticula. No evidence of acute diverticulitis. The patient is status post hysterectomy. The urinary bladder is unremarkable.  No destructive bony lesions are noted within pelvis.  IMPRESSION: 1. No acute inflammatory process within abdomen or pelvis. 2. No hydronephrosis or hydroureter.  Stable left renal cyst. 3. No small bowel obstruction. 4. Again noted sigmoid colon diverticula. No evidence of acute diverticulitis.   Electronically Signed   By: Natasha Mead M.D.   On: 06/17/2013 13:09     PERTINENT LAB RESULTS: CBC:  Recent Labs  06/17/13 1147 06/18/13 0526  WBC 5.8 5.9  HGB 12.3 11.2*  HCT 37.2 34.3*  PLT 168 157   CMET CMP     Component Value Date/Time   NA 146* 06/18/2013 0526   K 3.1* 06/18/2013 0526   CL 111 06/18/2013 0526   CO2 26 06/18/2013 0526   GLUCOSE 101* 06/18/2013 0526   BUN 12 06/18/2013 0526   CREATININE 0.80 06/18/2013 0526   CREATININE 0.74 04/03/2013 1441   CALCIUM 8.8 06/18/2013 0526   PROT 5.7* 06/18/2013 0526   ALBUMIN 3.3* 06/18/2013 0526   AST 37 06/18/2013 0526   ALT 27 06/18/2013 0526   ALKPHOS 79 06/18/2013 0526   BILITOT 0.4 06/18/2013 0526   GFRNONAA 67* 06/18/2013 0526   GFRAA 78* 06/18/2013 0526    GFR Estimated Creatinine Clearance: 57.4 ml/min (by C-G formula based on Cr of 0.8).  Recent Labs  06/17/13 1147 06/18/13 0526  LIPASE 116* 18    Recent Labs   06/17/13 1147  TROPONINI <0.30   No components found with this basename: POCBNP,  No results found for this basename: DDIMER,  in the last 72 hours No results found for this basename: HGBA1C,  in the last 72 hours No results found for this basename: CHOL, HDL, LDLCALC, TRIG, CHOLHDL, LDLDIRECT,  in the last 72 hours No results found for this basename: TSH, T4TOTAL, FREET3, T3FREE, THYROIDAB,  in the last 72 hours No results found for this basename: VITAMINB12, FOLATE, FERRITIN, TIBC, IRON, RETICCTPCT,  in the last 72 hours Coags: No results found for this basename: PT, INR,  in the last  72 hours Microbiology: No results found for this or any previous visit (from the past 240 hour(s)).   BRIEF HOSPITAL COURSE:  Acute pancreatitis  - Patient presented with brief abdominal pain that has now resolved, followed by numerous episodes of vomiting, lipase mildly elevated, CT scan of the abdomen without any need or abnormalities. Not sure if all of the symptoms can be explained by acute pancreatitis. Even if this is pancreatitis, etiology is not very evident, she status post cholecystectomy, there is no history of alcohol abuse. CT scan of the abdomen does not show any biliary dilatation. She is on losartan that can cause pancreatitis.  - Patient was admitted, losartan was held, she was given supportive care, she was started on clear liquids, and this was slowly advanced to a regular diet. This morning the pain is completely resolved, no further vomiting since admission, she has tolerated a regular diet and is considered stable to be discharged home. She has already been seen by gastroenterology, who will follow her up in the outpatient setting for further recommendations  . Atrial fibrillation  - Rate controlled, not anticoagulation candidate even history of frequent falls.  - Continue metoprolol  . Essential and other specified forms of tremor  - Continue Topamax   . GERD  - Continue PPI   .  Hypertension - Have stopped losartan, increased amlodipine to 10 mg, continue metoprolol. Blood pressure stable throughout the hospital course.  TODAY-DAY OF DISCHARGE:  Subjective:   Erin [name] today has no headache,no chest abdominal pain,no new weakness tingling or numbness, feels much better wants to go home today.   Objective:   Blood pressure 136/81, pulse 80, temperature 97.9 F (36.6 C), temperature source Oral, resp. rate 20, height 5\' 6"  (1.676 m), weight 75.751 kg (167 lb), SpO2 96.00%.  Intake/Output Summary (Last 24 hours) at 06/18/13 1042 Last data filed at 06/18/13 0500  Gross per 24 hour  Intake 1147.5 ml  Output    208 ml  Net  939.5 ml   Filed Weights   06/17/13 0948 06/17/13 1502  Weight: 76.204 kg (168 lb) 75.751 kg (167 lb)    Exam Awake Alert, Oriented *3, No new F.N deficits, Normal affect [place].AT,PERRAL Supple Neck,No JVD, No cervical lymphadenopathy appriciated.  Symmetrical Chest wall movement, Good air movement bilaterally, CTAB RRR,No Gallops,Rubs or new Murmurs, No Parasternal Heave +ve B.Sounds, Abd Soft, Non tender, No organomegaly appriciated, No rebound -guarding or rigidity. No Cyanosis, Clubbing or edema, No new Rash or bruise  DISCHARGE CONDITION: Stable  DISPOSITION: Home  DISCHARGE INSTRUCTIONS:    Activity:  As tolerated with Full fall precautions use walker/cane & assistance as needed  Diet recommendation: Heart Healthy diet       Discharge Orders   Future Appointments Provider Department Dept Phone   07/17/2013 1:30 PM Kerri Perches, MD [facility] [phone]   Patient should bring all necessary paperwork to be completed.  Arrive 15 minutes prior to the appointment.   Future Orders Complete By Expires   Call MD for:  persistant nausea and vomiting  As directed    Diet - low sodium heart healthy  As directed    Increase activity slowly  As directed       Follow-up Information   Follow up with  Syliva Overman, MD. Schedule an appointment as soon as possible for a visit in 1 week.   Specialty:  Family Medicine   Contact information:   [address], [address] [place]  [place] [address] [phone]       Follow up with Eula Listen, MD. Schedule an appointment as soon as possible for a visit in 2 weeks.   Specialty:  Gastroenterology   Contact information:   [address] [place] [address] [phone]      Total Time spent on discharge equals 45 minutes.  SignedJeoffrey Massed 06/18/2013 10:42 AM

## 2013-06-18 NOTE — Progress Notes (Signed)
UR chart review completed.  

## 2013-06-18 NOTE — Consult Note (Signed)
Referring Provider: Dr. Jerral Ralph  Primary Care Physician:  Dr. Syliva Overman Primary Gastroenterologist:  Dr. Darrick Penna   Date of Admission: 06/17/13 Date of Consultation: 06/18/13  Reason for Consultation:  Pancreatitis  HPI:  Erin [name] is a pleasant 79 year old female who is well known to our practice. She was last seen in Feb 2014 as an outpatient. History of chronic GERD and constipation. Ate cabbage and cornbread Saturday night. Woke up with nausea and vomiting multiple times. Notes abdominal pain at her umbilicus, felt like it was going to "come in two". Presented to the ED, with lipase 116. CT with contrast revealed normal pancreas. No acute findings.   Wakes up at night with abdominal pain intermittently, which is relieved by Nexium. Taking Nexium daily, sometimes twice a day if needed. On iron, stool is black. Takes suppository daily for bowel movements. BM daily. Good appetite. Tolerated breakfast this morning. Clinically improved since admission and desires to go home.   Patient is scared because brother was diagnosed with pancreatic cancer at age 61, lived 6 months thereafter.   EGD by Dr. Darrick Penna in Sept 2013. Findings of Schatzki's ring likely cause of mild dysphagia, dilation with Savary dilator. Multiple sessile polyps, benign path. Next TCS due in 2015 due to family history of colon cancer (sister).    Past Medical History  Diagnosis Date  . Anxiety   . Arthritis   . Hyperlipidemia   . Hypertension   . Osteoporosis   . GERD (gastroesophageal reflux disease)   . Hearing loss   . Restless leg   . Constipation   . Tremor   . Swelling of limb     Leg Swelling  . Obese   . Dyslipidemia   . Gall bladder disease   . Cataract   . Carpal tunnel syndrome of left wrist   . Atrial fibrillation   . Sleep apnea   . Esophageal dilatation   . Frequent falls   . Parkinson disease   . History of nuclear stress test 08/31/2012    lexiscan; negative for ischemia    Past  Surgical History  Procedure Laterality Date  . Appendectomy    . Cholecystectomy    . Abdominal hysterectomy    . Fracture surgery      X 4 LEFT ARM  . Benign cyst removed from kidney and lung, bilateral mastectomy    . Bilateral great toenail removal    . Knee arthroscopy  2012    right knee, Dr Romeo Apple  . Left forearm    . Breast surgery      bilateral 2000approx  . Tonsillectomy    . R hand surgery    . Bronchoscopy  02/15/2000  . Right vats.  "  . Right upper lobe wedge resection.  "  . Upper gastrointestinal endoscopy  11/25/1999    Esophagitis/ Normal proximal esophagus, stomach and duodenum  . Colonoscopy  01/17/2009    Dr. Arlyce Dice : Internal hemorrhoids/Diverticula, scattered in the ascending colon/  Moderate diverticulosis ascending colon to sigmoid colon  . Esophagogastroduodenoscopy   04/23/2003    Dr. Arlyce Dice: HIATAL HERNIA  . Colonoscopy  01/16/2001    Normal  . Cataract extraction, bilateral    . Esophagogastroduodenoscopy  03/20/2012    GNF:AOZHYQMV ring-LIKELY CAUSING MILD DYSPHAGIA/SMALL hiatal hernia/Multiple sessile polyps ranging between 3-42mm , path benign  . Cataract extraction    . Carpal tunnel release      left wrist  . Transthoracic echocardiogram  06/24/2009  EF=>55%, mild assymetric LVH; LA mildly dilated; mild mitral annular calcif, borderline MVP, mild-mod MR; mild-mod TR, RV systolic pressure elevated, mild pulm HTN; AV mildly sclerotic; mild pulm valve regurg - ordered r/t bradycardia   . Endovenous ablation saphenous vein w/ laser  01/2011    right GSV  . Cholecystectomy      Prior to Admission medications   Medication Sig Start Date End Date Taking? Authorizing Provider  amLODipine (NORVASC) 5 MG tablet TAKE 1 TABLET EVERY DAY 04/03/13  Yes Kerri Perches, MD  aspirin 81 MG tablet Take 81 mg by mouth every other day.    Yes Historical Provider, MD  cholecalciferol (VITAMIN D-400) 400 UNITS TABS tablet Take 400 Units by mouth daily.    Yes Historical Provider, MD  esomeprazole (NEXIUM) 40 MG capsule Take 1 capsule (40 mg total) by mouth daily before breakfast. 08/14/12  Yes Kerri Perches, MD  losartan (COZAAR) 50 MG tablet Take 1 tablet (50 mg total) by mouth daily. 02/13/13 02/13/14 Yes Kerri Perches, MD  magnesium gluconate (MAGONATE) 500 MG tablet Take 500 mg by mouth 2 (two) times daily.   Yes Historical Provider, MD  metoprolol succinate (TOPROL-XL) 50 MG 24 hr tablet TAKE 1 TABLET EVERY DAY 01/19/13  Yes Kerri Perches, MD  Potassium 99 MG TABS Take 99 mg by mouth daily.    Yes Historical Provider, MD  topiramate (TOPAMAX) 25 MG tablet One tablet in the morning and two tablets in the evening 10/16/12  Yes York Spaniel, MD  nitrofurantoin (MACRODANTIN) 100 MG capsule Take 1 capsule (100 mg total) by mouth 2 (two) times daily. 06/01/13   Kerri Perches, MD    Current Facility-Administered Medications  Medication Dose Route Frequency Provider Last Rate Last Dose  . 0.9 %  sodium chloride infusion   Intravenous Continuous Maretta Bees, MD 20 mL/hr at 06/18/13 0754    . acetaminophen (TYLENOL) tablet 650 mg  650 mg Oral Q6H PRN Shanker Levora Dredge, MD       Or  . acetaminophen (TYLENOL) suppository 650 mg  650 mg Rectal Q6H PRN Shanker Levora Dredge, MD      . albuterol (PROVENTIL) (5 MG/ML) 0.5% nebulizer solution 2.5 mg  2.5 mg Nebulization Q2H PRN Shanker Levora Dredge, MD      . amLODipine (NORVASC) tablet 10 mg  10 mg Oral Daily Maretta Bees, MD   10 mg at 06/18/13 0900  . [START ON 06/19/2013] aspirin chewable tablet 81 mg  81 mg Oral QODAY Shanker Levora Dredge, MD      . cholecalciferol (VITAMIN D) tablet 400 Units  400 Units Oral Daily Maretta Bees, MD   400 Units at 06/18/13 0900  . enoxaparin (LOVENOX) injection 40 mg  40 mg Subcutaneous Q24H Maretta Bees, MD   40 mg at 06/17/13 1552  . influenza vac split quadrivalent PF (FLUARIX) injection 0.5 mL  0.5 mL Intramuscular Tomorrow-1000 Shanker Levora Dredge, MD      . magnesium oxide (MAG-OX) tablet 400 mg  400 mg Oral BID Maretta Bees, MD   400 mg at 06/18/13 0900  . metoprolol succinate (TOPROL-XL) 24 hr tablet 50 mg  50 mg Oral Daily Maretta Bees, MD   50 mg at 06/18/13 0900  . ondansetron (ZOFRAN) tablet 4 mg  4 mg Oral Q6H PRN Maretta Bees, MD       Or  . ondansetron [facility]) injection 4 mg  4 mg  Intravenous Q6H PRN Maretta Bees, MD      . pantoprazole (PROTONIX) EC tablet 40 mg  40 mg Oral Daily Maretta Bees, MD   40 mg at 06/18/13 0900  . topiramate (TOPAMAX) tablet 25 mg  25 mg Oral Daily Maretta Bees, MD   25 mg at 06/17/13 1711    Allergies as of 06/17/2013 - Review Complete 06/17/2013  Allergen Reaction Noted  . Codeine Hives   . Morphine and related Itching and Other (See Comments) 12/24/2010    Family History  Problem Relation Age of Onset  . Diabetes Mother   . Heart disease Mother   . Stroke Mother   . Cancer Sister     KIDNEY  . Emphysema Sister   . Heart disease Brother   . Heart disease Sister   . Emphysema Sister   . Cancer Sister     BREAST  . Heart disease Brother   . Colon cancer Sister   . Alzheimer's disease Father   . Heart disease Sister   . Tremor Sister   . Tremor Brother   . Diabetes Child   . Alcoholism Child   . Cancer Brother   . Pancreatic cancer Brother     History   Social History  . Marital Status: Widowed    Spouse Name: N/A    Number of Children: 2  . Years of Education: N/A   Occupational History  . retired    Social History Main Topics  . Smoking status: Never Smoker   . Smokeless tobacco: Not on file  . Alcohol Use: No  . Drug Use: No  . Sexual Activity: No   Other Topics Concern  . Not on file   Social History Narrative  . No narrative on file    Review of Systems: As mentioned in HPI  Physical Exam: Vital signs in last 24 hours: Temp:  [97.5 F (36.4 C)-98 F (36.7 C)] 97.9 F (36.6 C) (12/08 0518) Pulse Rate:   [65-93] 80 (12/08 0518) Resp:  [18-20] 20 (12/08 0518) BP: (129-151)/(68-97) 136/81 mmHg (12/08 0518) SpO2:  [93 %-98 %] 96 % (12/08 0518) Weight:  [167 lb (75.751 kg)-168 lb (76.204 kg)] 167 lb (75.751 kg) (12/07 1502) Last BM Date: 06/16/13 General:   Alert,  Well-developed, well-nourished, pleasant and cooperative in NAD Head:  Normocephalic and atraumatic. Eyes:  Sclera clear, no icterus.   Conjunctiva pink. Ears:  Normal auditory acuity. Nose:  No deformity, discharge,  or lesions. Mouth:  No deformity or lesions, dentition normal. Neck:  Supple; no masses or thyromegaly. Lungs:  Clear throughout to auscultation.   No wheezes, crackles, or rhonchi. No acute distress. Heart:  Regular rate and rhythm; no murmurs, clicks, rubs,  or gallops. Abdomen:  Soft, very mild TTP RUQ and nondistended. No masses, hepatosplenomegaly or hernias noted. Normal bowel sounds, without guarding, and without rebound.   Rectal:  Deferred  Msk:  Symmetrical without gross deformities. Normal posture. Extremities:  Without clubbing or edema. Neurologic:  Alert and  oriented x4;  grossly normal neurologically. Skin:  Intact without significant lesions or rashes. Cervical Nodes:  No significant cervical adenopathy. Psych:  Alert and cooperative. Normal mood and affect.  Intake/Output from previous day: 12/07 0701 - 12/08 0700 In: 1147.5 [P.O.:180; I.V.:967.5] Out: 208 [Urine:208] Intake/Output this shift:    Lab Results:  Recent Labs  06/17/13 1147 06/18/13 0526  WBC 5.8 5.9  HGB 12.3 11.2*  HCT 37.2 34.3*  PLT 168 157  BMET  Recent Labs  06/17/13 1147 06/18/13 0526  NA 141 146*  K 4.7 3.1*  CL 108 111  CO2 27 26  GLUCOSE 152* 101*  BUN 18 12  CREATININE 0.76 0.80  CALCIUM 9.2 8.8   LFT  Recent Labs  06/17/13 1147 06/18/13 0526  PROT 6.9 5.7*  ALBUMIN 4.0 3.3*  AST 47* 37  ALT 24 27  ALKPHOS 93 79  BILITOT 0.4 0.4    Studies/Results: Ct Abdomen Pelvis W  Contrast  06/17/2013   CLINICAL DATA:  Lower abdominal pain, appendectomy, cholecystectomy, nausea, vomiting  EXAM: CT ABDOMEN AND PELVIS WITH CONTRAST  TECHNIQUE: Multidetector CT imaging of the abdomen and pelvis was performed using the standard protocol following bolus administration of intravenous contrast.  CONTRAST:  OMNIPAQUE IOHEXOL 300 MG/ML SOLN, 50mL OMNIPAQUE IOHEXOL 300 MG/ML SOLN  COMPARISON:  02/16/2012  FINDINGS: Osteopenia and mild degenerative changes lumbar spine are noted.  Lung bases shows linear atelectasis or scarring right base laterally.  Bilateral breast implants are partially visualized. No focal hepatic mass. The pancreas, spleen and adrenal glands are unremarkable. Kidneys are symmetrical in enhancement. Again noted a cyst in midpole of the left kidney measures 4 cm.  No hydronephrosis or hydroureter.  No aortic aneurysm.  Delayed renal images shows bilateral renal symmetrical excretion.  Small accessory splenule is noted. The patient is status postcholecystectomy.  No small bowel obstruction. No ascites or free air. No adenopathy. Status post appendectomy. Again noted multiple sigmoid colon diverticula. No evidence of acute diverticulitis. The patient is status post hysterectomy. The urinary bladder is unremarkable.  No destructive bony lesions are noted within pelvis.  IMPRESSION: 1. No acute inflammatory process within abdomen or pelvis. 2. No hydronephrosis or hydroureter.  Stable left renal cyst. 3. No small bowel obstruction. 4. Again noted sigmoid colon diverticula. No evidence of acute diverticulitis.   Electronically Signed   By: Natasha Mead M.D.   On: 06/17/2013 13:09    Impression: 79 year old female admitted with abdominal pain and found to have mildly elevated lipase and no CT evidence of pancreatitis. Clinically, she has improved since admission and is stable for discharge today. Tolerating diet. She does note her brother was diagnosed with pancreatic cancer in  his 65s, which concerns her.  To be thorough, would recommend either pancreatic protocol CT or EUS for further evaluation of the pancreas. Agree with the hospitalist decision to hold losartan for now. Anticipate discharge home today. We will arrange outpatient follow-up in our office for further work-up. Patient is agreeable to this as well.   Nira Retort, ANP-BC [facility]     LOS: 1 day    06/18/2013, 9:09 AM

## 2013-06-20 ENCOUNTER — Other Ambulatory Visit: Payer: Self-pay | Admitting: Family Medicine

## 2013-06-20 DIAGNOSIS — N390 Urinary tract infection, site not specified: Secondary | ICD-10-CM

## 2013-06-20 LAB — URINE CULTURE: Colony Count: 3000

## 2013-06-22 ENCOUNTER — Ambulatory Visit (INDEPENDENT_AMBULATORY_CARE_PROVIDER_SITE_OTHER): Payer: Medicare Other | Admitting: Urology

## 2013-06-22 DIAGNOSIS — N3020 Other chronic cystitis without hematuria: Secondary | ICD-10-CM

## 2013-06-22 DIAGNOSIS — N281 Cyst of kidney, acquired: Secondary | ICD-10-CM

## 2013-06-22 DIAGNOSIS — N3941 Urge incontinence: Secondary | ICD-10-CM

## 2013-06-25 ENCOUNTER — Ambulatory Visit (HOSPITAL_COMMUNITY)
Admission: RE | Admit: 2013-06-25 | Discharge: 2013-06-25 | Disposition: A | Discharge status: Home / Self Care | Payer: Medicare Other | Source: Ambulatory Visit | Attending: Family Medicine | Admitting: Family Medicine

## 2013-06-25 ENCOUNTER — Telehealth: Payer: Self-pay | Admitting: Gastroenterology

## 2013-06-25 ENCOUNTER — Ambulatory Visit (INDEPENDENT_AMBULATORY_CARE_PROVIDER_SITE_OTHER): Payer: Medicare Other | Admitting: Family Medicine

## 2013-06-25 ENCOUNTER — Other Ambulatory Visit: Payer: Self-pay | Admitting: Gastroenterology

## 2013-06-25 ENCOUNTER — Encounter: Payer: Self-pay | Admitting: Family Medicine

## 2013-06-25 VITALS — BP 120/78 | HR 60 | Resp 16 | Wt 170.0 lb

## 2013-06-25 DIAGNOSIS — M25471 Effusion, right ankle: Secondary | ICD-10-CM | POA: Insufficient documentation

## 2013-06-25 DIAGNOSIS — M25473 Effusion, unspecified ankle: Secondary | ICD-10-CM | POA: Insufficient documentation

## 2013-06-25 DIAGNOSIS — M7989 Other specified soft tissue disorders: Secondary | ICD-10-CM | POA: Insufficient documentation

## 2013-06-25 DIAGNOSIS — M25476 Effusion, unspecified foot: Secondary | ICD-10-CM | POA: Insufficient documentation

## 2013-06-25 DIAGNOSIS — E785 Hyperlipidemia, unspecified: Secondary | ICD-10-CM

## 2013-06-25 DIAGNOSIS — R748 Abnormal levels of other serum enzymes: Secondary | ICD-10-CM

## 2013-06-25 DIAGNOSIS — I10 Essential (primary) hypertension: Secondary | ICD-10-CM

## 2013-06-25 DIAGNOSIS — R7303 Prediabetes: Secondary | ICD-10-CM

## 2013-06-25 DIAGNOSIS — R0609 Other forms of dyspnea: Secondary | ICD-10-CM

## 2013-06-25 DIAGNOSIS — N390 Urinary tract infection, site not specified: Secondary | ICD-10-CM

## 2013-06-25 DIAGNOSIS — M7730 Calcaneal spur, unspecified foot: Secondary | ICD-10-CM | POA: Insufficient documentation

## 2013-06-25 DIAGNOSIS — R7309 Other abnormal glucose: Secondary | ICD-10-CM

## 2013-06-25 DIAGNOSIS — K859 Acute pancreatitis without necrosis or infection, unspecified: Secondary | ICD-10-CM

## 2013-06-25 NOTE — Telephone Encounter (Signed)
MRCP scheduled for Thursday Dec 18th at 8:00 am and Erin [name] is aware

## 2013-06-25 NOTE — Telephone Encounter (Signed)
Patient needs an MRCP per Dr. Darrick Penna for further assessment of non-specific elevated lipase during admission to [facility] in Dec 2014. She will be seeing Dr. Darrick Penna mid January.

## 2013-06-25 NOTE — Progress Notes (Signed)
   Subjective:    Patient ID: Erin [name], female    DOB: 1930/03/23, 79 y.o.   MRN: [id number]  HPI Pt in for hospital follow up for pancreatitis. Denies any current abdominal pain, her apetitie and energy are slowly improving [name] has a resistant bacteria in her urine for the the past 4 month, has upcoming cystoscopy planned and has been placed on chronic nitrofurantion, the only oral antibioitc which it is sensitive to, however , this unfortunately has been implicated in pancreatitis and an  Alternative will need to be found. She remains concerned about unilateral lower extremity swelling on the right with increased involvement of the ankle, and when she was recently seen by her pulmonary specialist he advised cardiology re eval so she has upcoming appt for this. She has chronic fatigue and exertional dyspnea no new concern of PND or orthopnea Denies dysuria or frequency   Review of Systems See HPI Denies recent fever or chills. Denies sinus pressure, nasal congestion, ear pain or sore throat. Denies chest congestion, productive cough or wheezing. Denies chest pains, palpitations , PND or orthopnea Denies abdominal pain, nausea, vomiting,diarrhea or constipation.   Denies dysuria, frequency, or hematuria Chronic  joint pain, swelling and limitation in mobility. Denies headaches, seizures, numbness, or tingling. Denies uncontrolled  depression, does have  Anxiety, states that her son believes she is "not sick" and merely seeking attention, and this hurts her feelings Denies skin break down or rash.        Objective:   Physical Exam Patient alert and oriented and in no cardiopulmonary distress.  HEENT: No facial asymmetry, EOMI, no sinus tenderness,  oropharynx pink and moist.  Neck supple no adenopathy.  Chest: Clear to auscultation bilaterally.  CVS: S1, S2 no murmurs, no S3.  ABD: Soft non tender. Bowel sounds normal.  Ext: right lower extremity edema +1  MS:  Decreased  ROM spine, shoulders, hips and knee and ankles.  Skin: Intact, no ulcerations or rash noted.  Psych: Good eye contact, normal affect. Memory mildly impaired, mildly anxious and tearful at times  CNS: CN 2-12 intact, power, normal throughout.        Assessment & Plan:

## 2013-06-25 NOTE — Patient Instructions (Signed)
F/u in 8 weeks , call if you need me before  Continue medications that you have recently been changed to  Please start wearing compression hose , get help, to help with the swelling  Xray of ankles today.  We will call for a sooner f/u with  Your cardiologist since you had to miss the recent one  I will send a message to Dr Darrick Penna with a question re your antibiotic from Dr Wilson Singer, since this conflicts with hospital d/c summary

## 2013-06-28 ENCOUNTER — Telehealth: Payer: Self-pay | Admitting: Gastroenterology

## 2013-06-28 ENCOUNTER — Telehealth: Payer: Self-pay | Admitting: Family Medicine

## 2013-06-28 ENCOUNTER — Ambulatory Visit (HOSPITAL_COMMUNITY)
Admission: RE | Admit: 2013-06-28 | Discharge: 2013-06-28 | Disposition: A | Discharge status: Home / Self Care | Payer: Medicare Other | Source: Ambulatory Visit | Attending: Gastroenterology | Admitting: Gastroenterology

## 2013-06-28 MED ORDER — CIPROFLOXACIN HCL 250 MG PO TABS: 250.0000 mg | ORAL_TABLET | Freq: Every day | ORAL | Status: DC

## 2013-06-28 NOTE — Telephone Encounter (Signed)
Pls let pt know that she should change to ciprofloxacin 250 mg daily until she sees Dr Annabell Howells,  Also please send him the note I have writtent to explain why the change based on the association of nitrofurantoin with the pancreatitis she was recntly hospitalized with. If he wants to make any other changes to her antibiotic , that will be fine  PLS get a response from Dr Wilson Singer before moving forward , and calling her

## 2013-06-28 NOTE — Telephone Encounter (Signed)
Pls let pt know that is the only antibiotic she can take orally that will treat

## 2013-06-28 NOTE — Telephone Encounter (Signed)
Pt has questions about her medications. She is worried that they might conflict with each other and cause her to get sick with pancreatitis. Please advise and call her at [phone]. Patient was also a no show for her MRCP today.

## 2013-06-29 ENCOUNTER — Ambulatory Visit: Payer: Medicare Other | Admitting: Internal Medicine

## 2013-06-29 ENCOUNTER — Other Ambulatory Visit: Payer: Self-pay

## 2013-06-29 ENCOUNTER — Telehealth: Payer: Self-pay | Admitting: Family Medicine

## 2013-06-29 ENCOUNTER — Telehealth: Payer: Self-pay | Admitting: Internal Medicine

## 2013-06-29 MED ORDER — FOSFOMYCIN TROMETHAMINE 3 G PO PACK: PACK | ORAL | Status: DC

## 2013-06-29 MED ORDER — FOSFOMYCIN TROMETHAMINE 3 G PO PACK: 3.0000 g | PACK | Freq: Every day | ORAL | Status: DC

## 2013-06-29 NOTE — Telephone Encounter (Signed)
Patient arrived 45 minutes late for her appointment with Dr. Rennis Golden.  I offered x 2 for her to see a PA today since she was having problems with her legs swelling.  She wanted to wait and see Dr. Rennis Golden.

## 2013-06-29 NOTE — Telephone Encounter (Signed)
See next telephone message. 

## 2013-06-29 NOTE — Telephone Encounter (Signed)
Called pharmacy and clarified rx.  One time daily x 3 days.   Copay is 92.00.  Attempted to reach patient to notify. No answer.  Also called Alisa with [facility].  Left message for her to return call.

## 2013-06-29 NOTE — Telephone Encounter (Signed)
Please let pt  know I have discussed with Dr Wilson Singer, we will try an antibiotic , fosfomycin, which she wikll take for 3 days, then needs repeat urine c/s , which I think we should do I/O cath specimen on 07/13/2013 for c/s. If we cannot get the drug she will need to have ID input so can be set up for one of the parenteral drugs she is sensitive to, we have A LOT to do on her  I am entering the drug now, if Alysa can help in getting the drug if it is a problem do not hesitate to contact her

## 2013-06-30 NOTE — Assessment & Plan Note (Signed)
Bilateral leg swelling most marked on right, no significant increased symptomatology as far as PND or orthopnea. Following recent pulmonary eval, she advised to have cardiology evaluate this, she has appt later this week, had to miss previous appt due to hospitalization on appt date

## 2013-06-30 NOTE — Assessment & Plan Note (Signed)
Chronic E. Coli UTI since past 4 months 02/2013. Now only oral antibiotic to which it is sensitiviteis nitrofurantoin. Discussed with urologist who is planning a  cystoscopy in the next several weeks, will treat with alternative antibiotic and re test for infection, since nitrofurantoin is now contraindicated due to her recent bout of pancreattitis

## 2013-06-30 NOTE — Assessment & Plan Note (Signed)
Uncontrolled, markedly elevated when last checked Updated lab needed Hyperlipidemia:Low fat diet discussed and encouraged.

## 2013-06-30 NOTE — Assessment & Plan Note (Signed)
Resolved, however nitrofurantoin has been implicated in the etiology and will need alternate medication to treat her chronic UTI which is resistant to all oral antibiotics with the exception of nitrofurantoin.

## 2013-06-30 NOTE — Assessment & Plan Note (Addendum)
Controlled, no change in , of note, losartan has been discontinued and dose of amlodipine increased . This was done due to association of losartan with pancreatitis

## 2013-06-30 NOTE — Assessment & Plan Note (Signed)
Patient educated about the importance of limiting  Carbohydrate intake , the need to commit to daily physical activity for a minimum of 30 minutes , and to commit weight loss. The fact that changes in all these areas will reduce or eliminate all together the development of diabetes is stressed.   Updated lab next visit 

## 2013-07-03 NOTE — Telephone Encounter (Signed)
I called pt and she said she spoke to Dr. Lodema Hong about her meds and she has no questions now. She does not want to reschdule MRCP. Said she is doing better and she does not want to do it unless she has another attack.

## 2013-07-03 NOTE — Telephone Encounter (Signed)
Noted  

## 2013-07-03 NOTE — Telephone Encounter (Signed)
REVIEWED.  

## 2013-07-09 ENCOUNTER — Other Ambulatory Visit: Payer: Self-pay

## 2013-07-09 MED ORDER — METOPROLOL SUCCINATE ER 50 MG PO TB24: ORAL_TABLET | ORAL | Status: DC

## 2013-07-15 ENCOUNTER — Telehealth: Payer: Self-pay | Admitting: Family Medicine

## 2013-07-15 DIAGNOSIS — N390 Urinary tract infection, site not specified: Secondary | ICD-10-CM

## 2013-07-15 NOTE — Telephone Encounter (Signed)
Pt needs urine for c/s please.She is to be instructed how to self collect , do NOT catheterize her, on further consideration, will choose not to catheterize her, urine to be sent for c/s only dx chronic UTI Important this gets done asap as the hope for urologic procedure in the near future to determine underlying pathology

## 2013-07-16 NOTE — Telephone Encounter (Signed)
Patient coming in today 07/16/2013

## 2013-07-16 NOTE — Telephone Encounter (Signed)
Patient states that he will come in for specimen.

## 2013-07-16 NOTE — Addendum Note (Signed)
Addended by: Denman George B on: 07/16/2013 10:32 AM   Modules accepted: Orders

## 2013-07-17 ENCOUNTER — Encounter: Payer: Self-pay | Admitting: Family Medicine

## 2013-07-17 ENCOUNTER — Ambulatory Visit (INDEPENDENT_AMBULATORY_CARE_PROVIDER_SITE_OTHER): Payer: Medicare Other | Admitting: Family Medicine

## 2013-07-17 VITALS — BP 132/74 | HR 68 | Resp 18 | Ht 66.5 in | Wt 171.1 lb

## 2013-07-17 DIAGNOSIS — Z13820 Encounter for screening for osteoporosis: Secondary | ICD-10-CM

## 2013-07-17 DIAGNOSIS — R7309 Other abnormal glucose: Secondary | ICD-10-CM

## 2013-07-17 DIAGNOSIS — R6889 Other general symptoms and signs: Secondary | ICD-10-CM

## 2013-07-17 DIAGNOSIS — E785 Hyperlipidemia, unspecified: Secondary | ICD-10-CM

## 2013-07-17 DIAGNOSIS — R7303 Prediabetes: Secondary | ICD-10-CM

## 2013-07-17 DIAGNOSIS — Z0000 Encounter for general adult medical examination without abnormal findings: Secondary | ICD-10-CM

## 2013-07-17 NOTE — Patient Instructions (Signed)
F/u in 3 month, call if you need m before  Pls consider changes re cooking and bill payment discussed for your safety  We will attempt to get a more affordable medication for your reflux  Continue low sodium diet and elevate your legs when able and wear compression hose   Fasting lipid and cmp in 3 months just before visit

## 2013-07-17 NOTE — Progress Notes (Signed)
Subjective:    Patient ID: Erin [name], female    DOB: Mar 19, 1930, 80 y.o.   MRN: [id number]  HPI  Preventive Screening-Counseling & Management   Patient present here today for a Medicare annual wellness visit.   Current Problems (verified)   Medications Prior to Visit Allergies (verified)   PAST HISTORY  Family History: noted elsewhere  Social History widow , has 2 adult sons, one is alcoholic , no current nicotine , alcohol or street drug use   Risk Factors  Current exercise habits:minimal, encouraged to commit to regular physical activity that she is able to do like chair exercises for maintaining muscle strength and improving her overall mobility    Dietary issues discussed:heart healthy diet, needs to reduce sugar and fat intake   Cardiac risk factors: none significant   Depression Screen  (Note: if answer to either of the following is "Yes", a more complete depression screening is indicated)   Over the past two weeks, have you felt down, depressed or hopeless? No , worried about her alcoholic son Over the past two weeks, have you felt little interest or pleasure in doing things? No  Have you lost interest or pleasure in daily life? No  Do you often feel hopeless? No  Do you cry easily over simple problems? No   Activities of Daily Living  In your present state of health, do you have any difficulty performing the following activities?  Driving?: no night driving due to lights blinding her othwerise no problem Managing money?: early problem developing paid electricity bill twice in December Feeding yourself?:No Getting from bed to chair?:yes due to unsteady gait and arthritis Climbing a flight of stairs?:yes Preparing food and eating?:yes at time has burned her pots twice Bathing or showering?:yes at times, shoulders have limited mobility, adapts to open front clothing Getting dressed?:yes due to reduced shoulder mobility Getting to the toilet?:No Using the  toilet?:No Moving around from place to place?:yes at times due to severe arthritis  Fall Risk Assessment In the past year have you fallen or had a near fall?:No Are you currently taking any medications that make you dizzy?:No   Hearing Difficulties: marginal Do you often ask people to speak up or repeat themselves?:yes, will eval in Summer in more detail with ENT Do you experience ringing or noises in your ears?:No Do you have difficulty understanding soft or whispered voices?:yes  Cognitive Testing  Alert? Yes Normal Appearance?Yes  Oriented to person? Yes Place? Yes  Time? Yes  Displays appropriate judgment?Yes  Can read the correct time from a watch face? yes Are you having problems remembering things?yes at times  Advanced Directives have been discussed with the patient?Yes , she is DNR   List the Names of Other Physician/Practitioners you currently use: see list   Indicate any recent Medical Services you may have received from other than [facility] providers in the past year (date may be approximate).   Assessment:    Annual Wellness Exam   Plan:    During the course of the visit the patient was educated and counseled about appropriate screening and preventive services including:  A healthy diet is rich in fruit, vegetables and whole grains. Poultry fish, nuts and beans are a healthy choice for protein rather then red meat. A low sodium diet and drinking 64 ounces of water daily is generally recommended. Oils and sweet should be limited. Carbohydrates especially for those who are diabetic or overweight, should be limited to 30-45 gram per meal.  It is important to eat on a regular schedule, at least 3 times daily. Snacks should be primarily fruits, vegetables or nuts. It is important that you exercise regularly at least 30 minutes 5 times a week. If you develop chest pain, have severe difficulty breathing, or feel very tired, stop exercising immediately and seek medical attention    Immunization reviewed and updated. Cancer screening reviewed and updated    Patient Instructions (the written plan) was given to the patient.  Medicare Attestation  I have personally reviewed:  The patient's medical and social history  Their use of alcohol, tobacco or illicit drugs  Their current medications and supplements  The patient's functional ability including ADLs,fall risks, home safety risks, cognitive, and hearing and visual impairment  Diet and physical activities  Evidence for depression or mood disorders  The patient's weight, height, BMI, and visual acuity have been recorded in the chart. I have made referrals, counseling, and provided education to the patient based on review of the above and I have provided the patient with a written personalized care plan for preventive services.     Review of Systems     Objective:   Physical Exam        Assessment & Plan:

## 2013-07-18 ENCOUNTER — Encounter: Payer: Self-pay | Admitting: Internal Medicine

## 2013-07-18 ENCOUNTER — Ambulatory Visit (INDEPENDENT_AMBULATORY_CARE_PROVIDER_SITE_OTHER): Payer: Medicare Other | Admitting: Internal Medicine

## 2013-07-18 VITALS — BP 128/74 | HR 65 | Ht 66.5 in | Wt 170.7 lb

## 2013-07-18 DIAGNOSIS — R0789 Other chest pain: Secondary | ICD-10-CM

## 2013-07-18 DIAGNOSIS — E785 Hyperlipidemia, unspecified: Secondary | ICD-10-CM

## 2013-07-18 DIAGNOSIS — I4891 Unspecified atrial fibrillation: Secondary | ICD-10-CM

## 2013-07-18 DIAGNOSIS — I10 Essential (primary) hypertension: Secondary | ICD-10-CM

## 2013-07-18 LAB — URINE CULTURE
COLONY COUNT: NO GROWTH
ORGANISM ID, BACTERIA: NO GROWTH

## 2013-07-18 NOTE — Progress Notes (Signed)
OFFICE NOTE  Chief Complaint:  RUQ abdominal pain  Primary Care Physician: Tula Nakayama, MD  HPI:  Erin [name]  is a pleasant 80 year old female with numerous medical problems including hypertension, dyslipidemia, sleep apnea, anxiety, palpitations, reflux, also new diagnosis of A-fib who is on Coumadin but has had several falls and is not really felt to be a good candidate for that. She was seen by me in the office with concern about symptoms of reflux and actually had some nausea and vomiting. After that office stay, however, I was concerned about pain that was referred to her back and I recommended a stress test. This was arranged for the end of January. However, she then developed severe weakness and presented to the emergency department. In the emergency department she was found to be hypokalemic and was repleted, hydrated I presume and ultimately does feel somewhat better. It is unclear what led to that other than significant nausea and vomiting. She does have a history of multiple GI problems including esophageal dilatation. She recently had a repeat NST at [facility] which was negative for ischemia. Her ongoing complaint is sharp, right upper quadrant abdominal pain.   Erin [name] returns today and was recently admitted for what appears to be pancreatitis. Her symptoms have improved with bowel rest, however she has refused to get an ERCP.  She does report a relative who had pancreatic cancer and died from this.  She says that she just does not want to know if she has it. There has not been any evidence of mass on CT exam, however a biliary stone cannot be excluded.  From a cardiac standpoint she is stable. Her weight is fairly even and her blood pressure is at goal. She is in atrial fibrillation which is rate controlled at 65 to she's currently only on aspirin due to frequent falls.  PMHx:  Past Medical History  Diagnosis Date  . Anxiety   . Arthritis   . Hyperlipidemia   .  Hypertension   . Osteoporosis   . GERD (gastroesophageal reflux disease)   . Hearing loss   . Restless leg   . Constipation   . Tremor   . Swelling of limb     Leg Swelling  . Obese   . Dyslipidemia   . Gall bladder disease   . Cataract   . Carpal tunnel syndrome of left wrist   . Atrial fibrillation   . Sleep apnea   . Esophageal dilatation   . Frequent falls   . Parkinson disease   . History of nuclear stress test 08/31/2012    lexiscan; negative for ischemia    Past Surgical History  Procedure Laterality Date  . Appendectomy    . Cholecystectomy    . Abdominal hysterectomy    . Fracture surgery      X 4 LEFT ARM  . Benign cyst removed from kidney and lung, bilateral mastectomy    . Bilateral great toenail removal    . Knee arthroscopy  2012    right knee, Dr Aline Brochure  . Left forearm    . Breast surgery      bilateral 2000approx  . Tonsillectomy    . R hand surgery    . Bronchoscopy  02/15/2000  . Right vats.  "  . Right upper lobe wedge resection.  "  . Upper gastrointestinal endoscopy  11/25/1999    Esophagitis/ Normal proximal esophagus, stomach and duodenum  . Colonoscopy  01/17/2009  Dr. Deatra Ina : Internal hemorrhoids/Diverticula, scattered in the ascending colon/  Moderate diverticulosis ascending colon to sigmoid colon  . Esophagogastroduodenoscopy   04/23/2003    Dr. Deatra Ina: HIATAL HERNIA  . Colonoscopy  01/16/2001    Normal  . Cataract extraction, bilateral    . Esophagogastroduodenoscopy  03/20/2012    [id number] ring-LIKELY CAUSING MILD DYSPHAGIA/SMALL hiatal hernia/Multiple sessile polyps ranging between 3-59mm , path benign  . Cataract extraction    . Carpal tunnel release      left wrist  . Transthoracic echocardiogram  06/24/2009    EF=>55%, mild assymetric LVH; LA mildly dilated; mild mitral annular calcif, borderline MVP, mild-mod MR; mild-mod TR, RV systolic pressure elevated, mild pulm HTN; AV mildly sclerotic; mild pulm valve regurg -  ordered r/t bradycardia   . Endovenous ablation saphenous vein w/ laser  01/2011    right GSV  . Cholecystectomy      FAMHx:  Family History  Problem Relation Age of Onset  . Diabetes Mother   . Heart disease Mother   . Stroke Mother   . Cancer Sister     KIDNEY  . Emphysema Sister   . Heart disease Brother   . Heart disease Sister   . Emphysema Sister   . Cancer Sister     BREAST  . Heart disease Brother   . Colon cancer Sister   . Alzheimer's disease Father   . Heart disease Sister   . Tremor Sister   . Tremor Brother   . Alcoholism Child   . Cancer Brother   . Pancreatic cancer Brother   . Diabetes Son     SOCHx:   reports that she has never smoked. She does not have any smokeless tobacco history on file. She reports that she does not drink alcohol or use illicit drugs.  ALLERGIES:  Allergies  Allergen Reactions  . Codeine Hives  . Morphine And Related Itching and Other (See Comments)    Makes pt hallucinate  . Losartan Other (See Comments)    Hospitalized in 06/2013 with pancreatitis, losartan is associated with increased risk of pancreatitis ,  Hence discontinued  . Nitrofurantoin Other (See Comments)    Hospitalized with pancreatitis in 06/2013. Nitrofurantoin implicated as a possible cause    ROS: A comprehensive review of systems was negative except for: Gastrointestinal: positive for pancreatitis, abdominal pain  HOME MEDS: Current Outpatient Prescriptions  Medication Sig Dispense Refill  . amLODipine (NORVASC) 10 MG tablet Take 1 tablet (10 mg total) by mouth daily. TAKE 1 TABLET EVERY DAY  30 tablet  0  . aspirin 81 MG tablet Take 81 mg by mouth every other day.       . cholecalciferol (VITAMIN D-400) 400 UNITS TABS tablet Take 400 Units by mouth daily.      . magnesium gluconate (MAGONATE) 500 MG tablet Take 500 mg by mouth 2 (two) times daily.      . metoprolol succinate (TOPROL-XL) 50 MG 24 hr tablet TAKE 1 TABLET EVERY DAY  30 tablet  4  .  Potassium 99 MG TABS Take 99 mg by mouth daily.       [name] topiramate (TOPAMAX) 25 MG tablet One tablet in the morning and two tablets in the evening  90 tablet  11   No current facility-administered medications for this visit.    LABS/IMAGING: No results found for this or any previous visit (from the past 48 hour(s)). No results found.  VITALS: BP 128/74  Pulse 65  Ht  5' 6.5" (1.689 m)  Wt 170 lb 11.2 oz (77.429 kg)  BMI 27.14 kg/m2  EXAM: General appearance: alert and no distress Neck: no adenopathy, no carotid bruit, no JVD, supple, symmetrical, trachea midline and thyroid not enlarged, symmetric, no tenderness/mass/nodules Lungs: clear to auscultation bilaterally Heart: irregularly irregular rhythm Abdomen: soft, mild TTP in the mid-epigastrium, bowel sounds normal Extremities: extremities normal, atraumatic, no cyanosis or edema Pulses: 2+ and symmetric Skin: Skin color, texture, turgor normal. No rashes or lesions Neurologic: Grossly normal Psych: Pleasant, mildly anxious  EKG: Atrial fibrillation at 65  ASSESSMENT: 1. Atrial fibrillation 2. Hypertension-controlled 3. Dyslipidemia-off of Vytorin due to cost and concerns about liver dysfunction 4. Falls/imbalance-not a warfarin candidate 5. Pancreatitis  PLAN: 1.   Mrs. Dahlen is doing fairly well except for some complaints of intermittent sharp right upper quadrant pain. It appears that she is having bouts of pancreatitis, but is not interested in further imaging to evaluate for possible stone that could be extracted. From a cardiac standpoint her A. fib is well controlled. Again she is not on warfarin or a novel oral anticoagulant due to falls which are frequent and significant. She is off of any medications for her cholesterol due to cost and concern about liver dysfunction. Her blood pressure is at goal today. Plan followup in 6 months.  Pixie Casino, MD, [facility] Attending Cardiologist [facility] 07/18/2013, 5:34 PM

## 2013-07-18 NOTE — Patient Instructions (Signed)
Your physician recommends that you schedule a follow-up appointment in: 6 months  

## 2013-07-19 ENCOUNTER — Encounter: Payer: Self-pay | Admitting: Internal Medicine

## 2013-07-19 ENCOUNTER — Other Ambulatory Visit: Payer: Self-pay | Admitting: Internal Medicine

## 2013-07-23 ENCOUNTER — Inpatient Hospital Stay (HOSPITAL_COMMUNITY): Admission: RE | Admit: 2013-07-23 | Payer: Medicare Other | Source: Ambulatory Visit | Admitting: Physical Therapy

## 2013-07-24 ENCOUNTER — Other Ambulatory Visit (HOSPITAL_COMMUNITY): Payer: Medicare Other

## 2013-07-25 ENCOUNTER — Telehealth: Payer: Self-pay

## 2013-07-25 MED ORDER — AMLODIPINE BESYLATE 10 MG PO TABS: 10.0000 mg | ORAL_TABLET | Freq: Every day | ORAL | Status: DC

## 2013-07-25 NOTE — Telephone Encounter (Signed)
Correct strength of Norvasc (10mg ) sent in to her pharmacy with note to d/c old 5mg  dose

## 2013-07-26 ENCOUNTER — Ambulatory Visit: Payer: Medicare Other | Admitting: Gastroenterology

## 2013-07-27 ENCOUNTER — Ambulatory Visit (INDEPENDENT_AMBULATORY_CARE_PROVIDER_SITE_OTHER): Payer: Medicare Other | Admitting: Urology

## 2013-07-27 DIAGNOSIS — R3129 Other microscopic hematuria: Secondary | ICD-10-CM

## 2013-07-27 DIAGNOSIS — N3020 Other chronic cystitis without hematuria: Secondary | ICD-10-CM

## 2013-07-27 DIAGNOSIS — N952 Postmenopausal atrophic vaginitis: Secondary | ICD-10-CM

## 2013-07-31 ENCOUNTER — Ambulatory Visit (HOSPITAL_COMMUNITY)
Admission: RE | Admit: 2013-07-31 | Discharge: 2013-07-31 | Disposition: A | Discharge status: Home / Self Care | Payer: Medicare Other | Source: Ambulatory Visit | Attending: Family Medicine | Admitting: Family Medicine

## 2013-07-31 DIAGNOSIS — M810 Age-related osteoporosis without current pathological fracture: Secondary | ICD-10-CM | POA: Insufficient documentation

## 2013-07-31 DIAGNOSIS — Z13820 Encounter for screening for osteoporosis: Secondary | ICD-10-CM

## 2013-08-01 ENCOUNTER — Encounter: Payer: Self-pay | Admitting: Family Medicine

## 2013-08-05 DIAGNOSIS — R6889 Other general symptoms and signs: Secondary | ICD-10-CM | POA: Insufficient documentation

## 2013-08-05 NOTE — Assessment & Plan Note (Signed)
Reports 2 recent episodes of forgetting to turn off stove and also paying bill twice, she needs family involved  More in money management , and I have discussed this with her, also needs to restrict cooking to times when someone else is at home

## 2013-08-05 NOTE — Assessment & Plan Note (Signed)
Annual exam as documented. Counseling done  re healthy lifestyle involving commitment to 150 minutes exercise per week, heart healthy diet, and attaining healthy weight.The importance of adequate sleep also discussed. Regular seat belt use and safe storage  of firearms if patient has them, is also discussed. Changes in health habits are decided on by the patient with goals and time frames  set for achieving them. Immunization and cancer screening needs are specifically addressed at this visit.  

## 2013-08-06 ENCOUNTER — Other Ambulatory Visit: Payer: Self-pay

## 2013-08-06 MED ORDER — RISEDRONATE SODIUM 35 MG PO TABS: 35.0000 mg | ORAL_TABLET | ORAL | Status: DC

## 2013-08-21 ENCOUNTER — Telehealth: Payer: Self-pay | Admitting: Family Medicine

## 2013-08-21 NOTE — Telephone Encounter (Signed)
Pls let me know what pPI is covered by the pt's ins , she cannot afford nexium and she needs to be on a PPI. This is a response from request from [facility]

## 2013-08-24 MED ORDER — PANTOPRAZOLE SODIUM 40 MG PO TBEC: 40.0000 mg | DELAYED_RELEASE_TABLET | Freq: Every day | ORAL | Status: DC

## 2013-08-24 NOTE — Telephone Encounter (Signed)
Called pharmacy and patient is filing medications under Erin [name].  Formulary checked and protonix is preferred.  Would you like to change to this?

## 2013-08-24 NOTE — Telephone Encounter (Signed)
Rx sent to pharmacy.  Will call and check copay later.

## 2013-08-24 NOTE — Telephone Encounter (Signed)
Yes pls protonix 40mg  daily

## 2013-08-24 NOTE — Addendum Note (Signed)
Addended by: Denman George B on: 08/24/2013 01:34 PM   Modules accepted: Orders

## 2013-08-28 ENCOUNTER — Ambulatory Visit: Payer: Medicare Other | Admitting: Family Medicine

## 2013-09-14 ENCOUNTER — Ambulatory Visit: Payer: Medicare Other | Admitting: Urology

## 2013-09-20 ENCOUNTER — Telehealth: Payer: Self-pay | Admitting: *Deleted

## 2013-09-21 ENCOUNTER — Telehealth: Payer: Self-pay

## 2013-09-21 MED ORDER — FUROSEMIDE 20 MG PO TABS: 20.0000 mg | ORAL_TABLET | Freq: Every day | ORAL | Status: DC | PRN

## 2013-09-21 MED ORDER — POTASSIUM CHLORIDE CRYS ER 20 MEQ PO TBCR: EXTENDED_RELEASE_TABLET | ORAL | Status: DC

## 2013-09-21 NOTE — Telephone Encounter (Signed)
meds sent.  Patient is aware per care manager with [facility].

## 2013-09-21 NOTE — Telephone Encounter (Signed)
I called the patient. The patient has had some swelling of the legs and ankles. This likely is related to her amlodipine, as this is a common side effect of this medication. The patient may require compression stockings for this purpose.

## 2013-09-21 NOTE — Telephone Encounter (Signed)
Patient states that her legs have been  swelling for  3 months now, she has been to the kidney doctor and the heart doctor and everything was ok.  Patient states when she walks it hurts and wondering if it is coming from her back problem.  Please advise.

## 2013-09-21 NOTE — Telephone Encounter (Signed)
I suggest lasix 20 mg one daily, as needed, for leg swelling #14 only , with potassium 20meq one daily ion the days she takes the lasix. She needs an OV within the 2 week period, preferably about in 10 days to evaluate leg swelling and  blood pressure

## 2013-09-21 NOTE — Addendum Note (Signed)
Addended by: Denman George B on: 09/21/2013 02:51 PM   Modules accepted: Orders

## 2013-10-03 ENCOUNTER — Encounter: Payer: Self-pay | Admitting: Neurology

## 2013-10-03 ENCOUNTER — Encounter (INDEPENDENT_AMBULATORY_CARE_PROVIDER_SITE_OTHER): Payer: Self-pay

## 2013-10-03 ENCOUNTER — Ambulatory Visit (INDEPENDENT_AMBULATORY_CARE_PROVIDER_SITE_OTHER): Payer: Medicare Other | Admitting: Neurology

## 2013-10-03 VITALS — BP 141/66 | HR 68 | Wt 166.0 lb

## 2013-10-03 DIAGNOSIS — G252 Other specified forms of tremor: Principal | ICD-10-CM

## 2013-10-03 DIAGNOSIS — R413 Other amnesia: Secondary | ICD-10-CM

## 2013-10-03 DIAGNOSIS — G63 Polyneuropathy in diseases classified elsewhere: Secondary | ICD-10-CM

## 2013-10-03 DIAGNOSIS — G250 Essential tremor: Secondary | ICD-10-CM

## 2013-10-03 DIAGNOSIS — R269 Unspecified abnormalities of gait and mobility: Secondary | ICD-10-CM

## 2013-10-03 HISTORY — DX: Polyneuropathy in diseases classified elsewhere: G63

## 2013-10-03 MED ORDER — TOPIRAMATE 25 MG PO TABS: ORAL_TABLET | ORAL | Status: DC

## 2013-10-03 NOTE — Patient Instructions (Signed)
Tremor  Tremor is a rhythmic, involuntary muscular contraction characterized by oscillations (to-and-fro movements) of a part of the body. The most common of all involuntary movements, tremor can affect various body parts such as the hands, head, facial structures, vocal cords, trunk, and legs; most tremors, however, occur in the hands. Tremor often accompanies neurological disorders associated with aging. Although the disorder is not life-threatening, it can be responsible for functional disability and social embarrassment.  TREATMENT   There are many types of tremor and several ways in which tremor is classified. The most common classification is by behavioral context or position. There are five categories of tremor within this classification: resting, postural, kinetic, task-specific, and psychogenic. Resting or static tremor occurs when the muscle is at rest, for example when the hands are lying on the lap. This type of tremor is often seen in patients with Parkinson's disease. Postural tremor occurs when a patient attempts to maintain posture, such as holding the hands outstretched. Postural tremors include physiological tremor, essential tremor, tremor with basal ganglia disease (also seen in patients with Parkinson's disease), cerebellar postural tremor, tremor with peripheral neuropathy, post-traumatic tremor, and alcoholic tremor. Kinetic or intention (action) tremor occurs during purposeful movement, for example during finger-to-nose testing. Task-specific tremor appears when performing goal-oriented tasks such as handwriting, speaking, or standing. This group consists of primary writing tremor, vocal tremor, and orthostatic tremor. Psychogenic tremor occurs in both older and younger patients. The key feature of this tremor is that it dramatically lessens or disappears when the patient is distracted.  PROGNOSIS  There are some treatment options available for tremor; the appropriate treatment depends on  accurate diagnosis of the cause. Some tremors respond to treatment of the underlying condition, for example in some cases of psychogenic tremor treating the patient's underlying mental problem may cause the tremor to disappear. Also, patients with tremor due to Parkinson's disease may be treated with Levodopa drug therapy. Symptomatic drug therapy is available for several other tremors as well. For those cases of tremor in which there is no effective drug treatment, physical measures such as teaching the patient to brace the affected limb during the tremor are sometimes useful. Surgical intervention such as thalamotomy or deep brain stimulation may be useful in certain cases.  Document Released: 06/18/2002 Document Revised: 09/20/2011 Document Reviewed: 06/28/2005  [employer]® Patient Information ©2014 [employer].

## 2013-10-03 NOTE — Progress Notes (Signed)
Reason for visit: Tremor  Erin [name] is an 80 y.o. female  History of present illness:  Erin [name] is an 80 year old right-handed white female with a history of an essential tremor. The patient is on Topamax, and this has helped her tremor somewhat. The patient is on 25 mg in the morning and 50 mg in the evening. The patient is tolerating the medication fairly well. The patient does report some mild memory problems on the medication. The patient had an episode of pancreatitis, etiology unknown, in December of 2014. The patient is doing well at this point. The patient has had some swelling of the ankles associated with amlodipine, and she reduced the dose from 10 mg to 5 mg daily, with some improvement in the ankle swelling. The patient does have some gait instability, and she usually uses a cane for ambulation, but the patient indicates that she let her neighbor barrow her cane. The patient otherwise is doing well, and the patient comes back for an evaluation.  Past Medical History  Diagnosis Date  . Anxiety   . Arthritis   . Hyperlipidemia   . Hypertension   . Osteoporosis   . GERD (gastroesophageal reflux disease)   . Hearing loss   . Restless leg   . Constipation   . Tremor   . Swelling of limb     Leg Swelling  . Obese   . Dyslipidemia   . Gall bladder disease   . Cataract   . Carpal tunnel syndrome of left wrist   . Atrial fibrillation   . Sleep apnea   . Esophageal dilatation   . Frequent falls   . Parkinson disease   . History of nuclear stress test 08/31/2012    lexiscan; negative for ischemia  . Polyneuropathy in other diseases classified elsewhere 10/03/2013  . HOH (hard of hearing)     Past Surgical History  Procedure Laterality Date  . Appendectomy    . Cholecystectomy    . Abdominal hysterectomy    . Fracture surgery      X 4 LEFT ARM  . Benign cyst removed from kidney and lung, bilateral mastectomy    . Bilateral great toenail removal    . Knee  arthroscopy  2012    right knee, Dr Aline Brochure  . Left forearm    . Breast surgery      bilateral 2000approx  . Tonsillectomy    . R hand surgery    . Bronchoscopy  02/15/2000  . Right vats.  "  . Right upper lobe wedge resection.  "  . Upper gastrointestinal endoscopy  11/25/1999    Esophagitis/ Normal proximal esophagus, stomach and duodenum  . Colonoscopy  01/17/2009    Dr. Deatra Ina : Internal hemorrhoids/Diverticula, scattered in the ascending colon/  Moderate diverticulosis ascending colon to sigmoid colon  . Esophagogastroduodenoscopy   04/23/2003    Dr. Deatra Ina: HIATAL HERNIA  . Colonoscopy  01/16/2001    Normal  . Cataract extraction, bilateral    . Esophagogastroduodenoscopy  03/20/2012    AVW:[id number] ring-LIKELY CAUSING MILD DYSPHAGIA/SMALL hiatal hernia/Multiple sessile polyps ranging between 3-38mm , path benign  . Cataract extraction    . Carpal tunnel release      left wrist  . Transthoracic echocardiogram  06/24/2009    EF=>55%, mild assymetric LVH; LA mildly dilated; mild mitral annular calcif, borderline MVP, mild-mod MR; mild-mod TR, RV systolic pressure elevated, mild pulm HTN; AV mildly sclerotic; mild pulm valve regurg - ordered r/t  bradycardia   . Endovenous ablation saphenous vein w/ laser  01/2011    right GSV  . Cholecystectomy      Family History  Problem Relation Age of Onset  . Diabetes Mother   . Heart disease Mother   . Stroke Mother   . Cancer Sister     KIDNEY  . Emphysema Sister   . Heart disease Brother   . Heart disease Sister   . Emphysema Sister   . Cancer Sister     BREAST  . Heart disease Brother   . Colon cancer Sister   . Alzheimer's disease Father   . Heart disease Sister   . Tremor Sister   . Tremor Brother   . Alcoholism Child   . Cancer Brother   . Pancreatic cancer Brother   . Diabetes Son     Social history:  reports that she has never smoked. She has never used smokeless tobacco. She reports that she does not drink  alcohol or use illicit drugs.    Allergies  Allergen Reactions  . Codeine Hives  . Morphine And Related Itching and Other (See Comments)    Makes pt hallucinate  . Losartan Other (See Comments)    Hospitalized in 06/2013 with pancreatitis, losartan is associated with increased risk of pancreatitis ,  Hence discontinued  . Nitrofurantoin Other (See Comments)    Hospitalized with pancreatitis in 06/2013. Nitrofurantoin implicated as a possible cause    Medications:  Current Outpatient Prescriptions on File Prior to Visit  Medication Sig Dispense Refill  . amLODipine (NORVASC) 10 MG tablet Take 1 tablet (10 mg total) by mouth daily. TAKE 1 TABLET EVERY DAY  30 tablet  4  . aspirin 81 MG tablet Take 81 mg by mouth every other day.       . cholecalciferol (VITAMIN D-400) 400 UNITS TABS tablet Take 400 Units by mouth daily.      . furosemide (LASIX) 20 MG tablet Take 1 tablet (20 mg total) by mouth daily as needed.  14 tablet  0  . magnesium gluconate (MAGONATE) 500 MG tablet Take 500 mg by mouth 2 (two) times daily.      . metoprolol succinate (TOPROL-XL) 50 MG 24 hr tablet TAKE 1 TABLET EVERY DAY  30 tablet  4  . potassium chloride SA (K-DUR,KLOR-CON) 20 MEQ tablet Take 1 tablet by mouth daily on days when lasix is taken  14 tablet  0  . risedronate (ACTONEL) 35 MG tablet Take 1 tablet (35 mg total) by mouth every 7 (seven) days. with water on empty stomach, nothing by mouth or lie down for next 30 minutes.  4 tablet  11  . Potassium 99 MG TABS Take 99 mg by mouth daily.        No current facility-administered medications on file prior to visit.    ROS:  Out of a complete 14 system review of symptoms, the patient complains only of the following symptoms, and all other reviewed systems are negative.  Muscle cramps Tremors Gait instability Leg swelling  Blood pressure 141/66, pulse 68, weight 166 lb (75.297 kg).  Physical Exam  General: The patient is alert and cooperative at the  time of the examination.  Skin: 2+ edema ankles is noted bilaterally.   Neurologic Exam  Mental status: The patient is oriented x 3.  Cranial nerves: Facial symmetry is present. Speech is normal, no aphasia or dysarthria is noted. Extraocular movements are full. Visual fields are full.  Motor:  The patient has good strength in all 4 extremities.  Sensory examination:Soft touch sensation on the face, arms, and legs is symmetric.  Coordination: The patient has good finger-nose-finger and heel-to-shin bilaterally.The patient does have an intention tremor with finger-nose-finger bilaterally.  Gait and station: The patient has a normal gait. Tandem gait is slightly unsteady. Romberg is negative. No drift is seen.  Reflexes: Deep tendon reflexes are symmetric.   Assessment/Plan:  One. Benign essential tremor  2. Mild memory disturbance  3. Peripheral neuropathy  The patient is doing relatively well at this point. The patient will be maintained on the Topamax. The patient will followup through this office in one year. A prescription was written for the Topamax. The patient may be using Benadryl at night for sleep.  Jill Alexanders MD 10/03/2013 7:35 PM  [facility] [address] [employer] [place], [place] [address]  Phone [phone] Fax [phone]

## 2013-10-10 LAB — COMPREHENSIVE METABOLIC PANEL
ALBUMIN: 4.1 g/dL (ref 3.5–5.2)
ALK PHOS: 91 U/L (ref 39–117)
AST: 17 U/L (ref 0–37)
BUN: 12 mg/dL (ref 6–23)
CO2: 29 mEq/L (ref 19–32)
Calcium: 9.2 mg/dL (ref 8.4–10.5)
Chloride: 106 mEq/L (ref 96–112)
Creat: 0.81 mg/dL (ref 0.50–1.10)
GLUCOSE: 103 mg/dL — AB (ref 70–99)
POTASSIUM: 3.6 meq/L (ref 3.5–5.3)
Sodium: 142 mEq/L (ref 135–145)
TOTAL PROTEIN: 6.4 g/dL (ref 6.0–8.3)
Total Bilirubin: 0.6 mg/dL (ref 0.2–1.2)

## 2013-10-10 LAB — LIPID PANEL
Cholesterol: 213 mg/dL — ABNORMAL HIGH (ref 0–200)
HDL: 47 mg/dL (ref >39)
LDL CALC: 149 mg/dL — AB (ref 0–99)
Total CHOL/HDL Ratio: 4.5 Ratio
Triglycerides: 86 mg/dL (ref <150)
VLDL: 17 mg/dL (ref 0–40)

## 2013-10-15 ENCOUNTER — Encounter: Payer: Self-pay | Admitting: Family Medicine

## 2013-10-15 ENCOUNTER — Ambulatory Visit (INDEPENDENT_AMBULATORY_CARE_PROVIDER_SITE_OTHER): Payer: Medicare Other | Admitting: Family Medicine

## 2013-10-15 VITALS — BP 130/74 | HR 70 | Resp 18 | Ht 66.5 in | Wt 167.0 lb

## 2013-10-15 DIAGNOSIS — G4700 Insomnia, unspecified: Secondary | ICD-10-CM

## 2013-10-15 DIAGNOSIS — I10 Essential (primary) hypertension: Secondary | ICD-10-CM

## 2013-10-15 DIAGNOSIS — R7309 Other abnormal glucose: Secondary | ICD-10-CM

## 2013-10-15 DIAGNOSIS — R7303 Prediabetes: Secondary | ICD-10-CM

## 2013-10-15 DIAGNOSIS — J309 Allergic rhinitis, unspecified: Secondary | ICD-10-CM

## 2013-10-15 DIAGNOSIS — M810 Age-related osteoporosis without current pathological fracture: Secondary | ICD-10-CM

## 2013-10-15 DIAGNOSIS — K219 Gastro-esophageal reflux disease without esophagitis: Secondary | ICD-10-CM

## 2013-10-15 DIAGNOSIS — M899 Disorder of bone, unspecified: Secondary | ICD-10-CM

## 2013-10-15 DIAGNOSIS — M7989 Other specified soft tissue disorders: Secondary | ICD-10-CM

## 2013-10-15 DIAGNOSIS — E785 Hyperlipidemia, unspecified: Secondary | ICD-10-CM

## 2013-10-15 DIAGNOSIS — M949 Disorder of cartilage, unspecified: Secondary | ICD-10-CM

## 2013-10-15 MED ORDER — ATORVASTATIN CALCIUM 10 MG PO TABS: 10.0000 mg | ORAL_TABLET | Freq: Every day | ORAL | Status: DC

## 2013-10-15 NOTE — Assessment & Plan Note (Signed)
Uncontrolled, pt to start daily claritin and she will use benadryl at night for sleep

## 2013-10-15 NOTE — Assessment & Plan Note (Signed)
Resolved with lower dose of amlodipine,mand of note her BP is controlled

## 2013-10-15 NOTE — Assessment & Plan Note (Signed)
Controlled, no change in medication  

## 2013-10-15 NOTE — Assessment & Plan Note (Signed)
Sleep hygiene reviewed. Pt to replace aleve sinus with benadryl due to safety

## 2013-10-15 NOTE — Assessment & Plan Note (Signed)
Patient educated about the importance of limiting  Carbohydrate intake , the need to commit to daily physical activity for a minimum of 30 minutes , and to commit weight loss. The fact that changes in all these areas will reduce or eliminate all together the development of diabetes is stressed.   Updated lab needed at/ before next visit.  

## 2013-10-15 NOTE — Progress Notes (Signed)
   Subjective:    Patient ID: Erin [name], female    DOB: 1929/08/29, 80 y.o.   MRN: [id number]  HPI The PT is here for follow up and re-evaluation of chronic medical conditions, medication management and review of any available recent lab and radiology data. Agrees to statin if  affordable based on lipid profile Preventive health is updated, specifically  Cancer screening and Immunization.   Has been having problems with leg swelling, called /contacted neurology and also cardiology, with reduced dose of amlodipine this has resolved and her BP is good. States unable to take actonel , causes abdominal pain, will consider annual reclast but wants to check cost C/o in increased allergy symptoms in past 2 weeks "I have a faucet running out my nose" esp when I cook She uses advil pm for sleep, I have advised her to  Change to benadryl alone for less s/e       Review of Systems See HPI Denies recent fever or chills.  Denies chest congestion, productive cough or wheezing. Denies chest pains, palpitations , PND or orthopnea Denies nausea, vomiting,diarrhea or constipation.   Denies dysuria, frequency, hesitancy or incontinence.  Denies headaches, seizures, numbness, or tingling. Denies depression or  anxiety . Denies skin break down or rash.        Objective:   Physical Exam  BP 130/74  Pulse 70  Resp 18  Ht 5' 6.5" (1.689 m)  Wt 167 lb 0.6 oz (75.769 kg)  BMI 26.56 kg/m2  SpO2 96% Patient alert and oriented and in no cardiopulmonary distress.  HEENT: No facial asymmetry, EOMI, no sinus tenderness,  oropharynx pink and moist.  Neck decreased though adequate adequate air entry no adenopathy.  Chest: Clear to auscultation bilaterally.  CVS: S1, S2 no murmurs, no S3.  ABD: Soft non tender. Bowel sounds normal.  Ext: Trace edema  MS: decreased  ROM spine, shoulders, hips and knees.Swelling and deformity of ankles. Marked kyphosis of thoracic spine  Skin: Intact, no  ulcerations or rash noted.  Psych: Good eye contact, normal affect. Memory intact not anxious or depressed appearing.  CNS: CN 2-12 intact, power,  normal throughout.       Assessment & Plan:  ESSENTIAL HYPERTENSION, BENIGN Controlled, no change in medication   Hyperlipidemia Uncontrolled Hyperlipidemia:Low fat diet discussed and encouraged.  Start statin Updated lab needed at/ before next visit.   Osteoporosis, senile Reports intolerance of oral bisphosphonate she will be checking for insurance coverage of parenteral med, understands the need to treat her disease, she is aware of loss of height and excessive humping of back and increased fracture risk  Prediabetes Patient educated about the importance of limiting  Carbohydrate intake , the need to commit to daily physical activity for a minimum of 30 minutes , and to commit weight loss. The fact that changes in all these areas will reduce or eliminate all together the development of diabetes is stressed.   Updated lab needed at/ before next visit.   GERD Controlled, no change in medication   Leg swelling Resolved with lower dose of amlodipine,mand of note her BP is controlled  Allergic rhinitis Uncontrolled, pt to start daily claritin and she will use benadryl at night for sleep  Insomnia Sleep hygiene reviewed. Pt to replace aleve sinus with benadryl due to safety

## 2013-10-15 NOTE — Assessment & Plan Note (Signed)
Uncontrolled Hyperlipidemia:Low fat diet discussed and encouraged.  Start statin Updated lab needed at/ before next visit.

## 2013-10-15 NOTE — Assessment & Plan Note (Signed)
Reports intolerance of oral bisphosphonate she will be checking for insurance coverage of parenteral med, understands the need to treat her disease, she is aware of loss of height and excessive humping of back and increased fracture risk

## 2013-10-15 NOTE — Patient Instructions (Addendum)
F/u in 4 month, call if you need me before please.  Blood pressure, liver and kidneys are excellent   You need to start medication at bedtime for cholesterol,and reduce fried and fatty food intake, lipitor 10mg   I recommend once yearly injection for your osteoporosis since you are intolerant of medication by mouth and you have severe disease, please contact your insurance re the cost and lety me know , since this is a real concern of yours, Reclast  Vitamin D level is being added to your lab we will contact you if abnormal  Fasting lipid, HBA1C,TSH, lipid and cmp in 4 month please   STOP ALEVE PM for sleep, start benadryl 25 mg  one at bedtime, and use loratidine  10 mg one daily for allergies until "the faucet stops!"  Call [facility] and set up an appointment for hearing eval [phone], you may get help with hearing aids!

## 2013-10-18 ENCOUNTER — Other Ambulatory Visit: Payer: Self-pay | Admitting: Family Medicine

## 2013-10-18 DIAGNOSIS — M810 Age-related osteoporosis without current pathological fracture: Secondary | ICD-10-CM

## 2013-10-29 ENCOUNTER — Telehealth: Payer: Self-pay

## 2013-10-29 NOTE — Telephone Encounter (Signed)
If we have the plain yellow forms I will fill this out and she can collec it, please verify with her that she still does NOT want to be resuccitated

## 2013-10-29 NOTE — Telephone Encounter (Signed)
Noted and form ready for signature

## 2013-10-29 NOTE — Telephone Encounter (Signed)
Please advise 

## 2013-11-05 NOTE — Telephone Encounter (Signed)
This encounter has been closed 

## 2013-12-04 ENCOUNTER — Telehealth: Payer: Self-pay | Admitting: Family Medicine

## 2013-12-04 NOTE — Telephone Encounter (Signed)
Good to know

## 2013-12-14 ENCOUNTER — Encounter (HOSPITAL_COMMUNITY)
Admission: RE | Admit: 2013-12-14 | Discharge: 2013-12-14 | Disposition: A | Discharge status: Home / Self Care | Payer: Medicare Other | Source: Ambulatory Visit | Attending: "Endocrinology | Admitting: "Endocrinology

## 2013-12-14 DIAGNOSIS — Z8781 Personal history of (healed) traumatic fracture: Secondary | ICD-10-CM | POA: Insufficient documentation

## 2013-12-14 DIAGNOSIS — M810 Age-related osteoporosis without current pathological fracture: Secondary | ICD-10-CM | POA: Insufficient documentation

## 2013-12-14 DIAGNOSIS — K219 Gastro-esophageal reflux disease without esophagitis: Secondary | ICD-10-CM | POA: Insufficient documentation

## 2013-12-14 DIAGNOSIS — Z90710 Acquired absence of both cervix and uterus: Secondary | ICD-10-CM | POA: Insufficient documentation

## 2013-12-14 DIAGNOSIS — I10 Essential (primary) hypertension: Secondary | ICD-10-CM | POA: Insufficient documentation

## 2013-12-14 DIAGNOSIS — E785 Hyperlipidemia, unspecified: Secondary | ICD-10-CM | POA: Insufficient documentation

## 2013-12-14 DIAGNOSIS — Z79899 Other long term (current) drug therapy: Secondary | ICD-10-CM | POA: Insufficient documentation

## 2013-12-14 LAB — COMPREHENSIVE METABOLIC PANEL
ALBUMIN: 3.7 g/dL (ref 3.5–5.2)
ALT: 9 U/L (ref 0–35)
AST: 19 U/L (ref 0–37)
Alkaline Phosphatase: 93 U/L (ref 39–117)
BUN: 17 mg/dL (ref 6–23)
CALCIUM: 9.4 mg/dL (ref 8.4–10.5)
CO2: 27 mEq/L (ref 19–32)
CREATININE: 0.74 mg/dL (ref 0.50–1.10)
Chloride: 106 mEq/L (ref 96–112)
GFR calc Af Amer: 89 mL/min — ABNORMAL LOW (ref >90)
GFR, EST NON AFRICAN AMERICAN: 77 mL/min — AB (ref >90)
Glucose, Bld: 93 mg/dL (ref 70–99)
Potassium: 4 mEq/L (ref 3.7–5.3)
Sodium: 143 mEq/L (ref 137–147)
Total Bilirubin: 0.4 mg/dL (ref 0.3–1.2)
Total Protein: 6.9 g/dL (ref 6.0–8.3)

## 2013-12-14 MED ORDER — ZOLEDRONIC ACID 5 MG/100ML IV SOLN
5.0000 mg | Freq: Once | INTRAVENOUS | Status: AC
Start: 2013-12-14 — End: 2013-12-14
  Administered 2013-12-14: 5 mg via INTRAVENOUS
  Filled 2013-12-14: qty 100

## 2013-12-14 MED ORDER — SODIUM CHLORIDE 0.9 % IV SOLN
INTRAVENOUS | Status: AC
Start: 2013-12-14 — End: 2013-12-14
  Filled 2013-12-14: qty 100

## 2013-12-14 NOTE — Progress Notes (Signed)
Results for Erin [name], Erin [name] (MRN [id number]) as of 12/14/2013 10:50  Ref. Range 12/14/2013 10:00  Sodium Latest Range: 137-147 mEq/L 143  Potassium Latest Range: 3.7-5.3 mEq/L 4.0  Chloride Latest Range: 96-112 mEq/L 106  CO2 Latest Range: 19-32 mEq/L 27  BUN Latest Range: 6-23 mg/dL 17  Creatinine Latest Range: 0.50-1.10 mg/dL 0.74  Calcium Latest Range: 8.4-10.5 mg/dL 9.4  GFR calc non Af Amer Latest Range: >90 mL/min 77 (L)  GFR calc Af Amer Latest Range: >90 mL/min 89 (L)  Glucose Latest Range: 70-99 mg/dL 93  Alkaline Phosphatase Latest Range: 39-117 U/L 93  Albumin Latest Range: 3.5-5.2 g/dL 3.7  AST Latest Range: 0-37 U/L 19  ALT Latest Range: 0-35 U/L 9  Total Protein Latest Range: 6.0-8.3 g/dL 6.9  Total Bilirubin Latest Range: 0.3-1.2 mg/dL 0.4

## 2013-12-14 NOTE — Progress Notes (Signed)
reclast completed. IV site flushed with normal saline. Tolerated well.

## 2013-12-14 NOTE — Discharge Instructions (Signed)

## 2013-12-14 NOTE — Progress Notes (Signed)
Here for labs and reclast infusion if indicated. Dx 733.01

## 2013-12-14 NOTE — Progress Notes (Signed)
Voices no c/o at this time. D/C to home in good condition. 

## 2013-12-14 NOTE — Progress Notes (Signed)
Lab results faxed to pharmacy.

## 2013-12-20 ENCOUNTER — Other Ambulatory Visit: Payer: Self-pay | Admitting: Family Medicine

## 2014-01-10 ENCOUNTER — Other Ambulatory Visit: Payer: Self-pay | Admitting: Family Medicine

## 2014-01-23 ENCOUNTER — Ambulatory Visit (INDEPENDENT_AMBULATORY_CARE_PROVIDER_SITE_OTHER): Payer: Medicare Other | Admitting: Internal Medicine

## 2014-01-23 ENCOUNTER — Encounter: Payer: Self-pay | Admitting: Internal Medicine

## 2014-01-23 VITALS — BP 136/62 | HR 66 | Ht 63.5 in | Wt 159.5 lb

## 2014-01-23 DIAGNOSIS — I4891 Unspecified atrial fibrillation: Secondary | ICD-10-CM

## 2014-01-23 DIAGNOSIS — E785 Hyperlipidemia, unspecified: Secondary | ICD-10-CM

## 2014-01-23 DIAGNOSIS — I10 Essential (primary) hypertension: Secondary | ICD-10-CM

## 2014-01-23 DIAGNOSIS — I482 Chronic atrial fibrillation, unspecified: Secondary | ICD-10-CM

## 2014-01-23 NOTE — Progress Notes (Signed)
OFFICE NOTE  Chief Complaint:  RUQ abdominal pain  Primary Care Physician: Tula Nakayama, MD  HPI:  Erin [name]  is a pleasant 80 year old female with numerous medical problems including hypertension, dyslipidemia, sleep apnea, anxiety, palpitations, reflux, also new diagnosis of A-fib who is on Coumadin but has had several falls and is not really felt to be a good candidate for that. She was seen by me in the office with concern about symptoms of reflux and actually had some nausea and vomiting. After that office stay, however, I was concerned about pain that was referred to her back and I recommended a stress test. This was arranged for the end of January. However, she then developed severe weakness and presented to the emergency department. In the emergency department she was found to be hypokalemic and was repleted, hydrated I presume and ultimately does feel somewhat better. It is unclear what led to that other than significant nausea and vomiting. She does have a history of multiple GI problems including esophageal dilatation. She recently had a repeat NST at [facility] which was negative for ischemia. Her ongoing complaint is sharp, right upper quadrant abdominal pain.   Erin [name] recently had pancreatitis. Her symptoms have improved with bowel rest, however she has refused to get an ERCP.  She does report a relative who had pancreatic cancer and died from this.  She says that she just does not want to know if she has it. There has not been any evidence of mass on CT exam, however a biliary stone cannot be excluded.  From a cardiac standpoint she is stable. Her weight is fairly even and her blood pressure is at goal. She is in atrial fibrillation which is rate controlled at 65 to she's currently only on aspirin due to frequent falls.  Erin [name] returns today and is doing fairly well. She denies any chest pain or worsening shortness of breath. She continues to have bouts of pancreatitis  and reflux with nausea. She says that she occasionally has to try to induce vomiting by putting her finger down her throat. I advised against this. She is not interested in an EGD after a bad experience she had the last time.  PMHx:  Past Medical History  Diagnosis Date  . Anxiety   . Arthritis   . Hyperlipidemia   . Hypertension   . Osteoporosis   . GERD (gastroesophageal reflux disease)   . Hearing loss   . Restless leg   . Constipation   . Tremor   . Swelling of limb     Leg Swelling  . Obese   . Dyslipidemia   . Gall bladder disease   . Cataract   . Carpal tunnel syndrome of left wrist   . Atrial fibrillation   . Sleep apnea   . Esophageal dilatation   . Frequent falls   . Parkinson disease   . History of nuclear stress test 08/31/2012    lexiscan; negative for ischemia  . Polyneuropathy in other diseases classified elsewhere 10/03/2013  . HOH (hard of hearing)     Past Surgical History  Procedure Laterality Date  . Appendectomy    . Cholecystectomy    . Abdominal hysterectomy    . Fracture surgery      X 4 LEFT ARM  . Benign cyst removed from kidney and lung, bilateral mastectomy    . Bilateral great toenail removal    . Knee arthroscopy  2012    right knee,  Dr Aline Brochure  . Left forearm    . Breast surgery      bilateral 2000approx  . Tonsillectomy    . R hand surgery    . Bronchoscopy  02/15/2000  . Right vats.  "  . Right upper lobe wedge resection.  "  . Upper gastrointestinal endoscopy  11/25/1999    Esophagitis/ Normal proximal esophagus, stomach and duodenum  . Colonoscopy  01/17/2009    Dr. Deatra Ina : Internal hemorrhoids/Diverticula, scattered in the ascending colon/  Moderate diverticulosis ascending colon to sigmoid colon  . Esophagogastroduodenoscopy   04/23/2003    Dr. Deatra Ina: HIATAL HERNIA  . Colonoscopy  01/16/2001    Normal  . Cataract extraction, bilateral    . Esophagogastroduodenoscopy  03/20/2012    YSA:[id number] ring-LIKELY CAUSING MILD  DYSPHAGIA/SMALL hiatal hernia/Multiple sessile polyps ranging between 3-27mm , path benign  . Cataract extraction    . Carpal tunnel release      left wrist  . Transthoracic echocardiogram  06/24/2009    EF=>55%, mild assymetric LVH; LA mildly dilated; mild mitral annular calcif, borderline MVP, mild-mod MR; mild-mod TR, RV systolic pressure elevated, mild pulm HTN; AV mildly sclerotic; mild pulm valve regurg - ordered r/t bradycardia   . Endovenous ablation saphenous vein w/ laser  01/2011    right GSV  . Cholecystectomy      FAMHx:  Family History  Problem Relation Age of Onset  . Diabetes Mother   . Heart disease Mother   . Stroke Mother   . Cancer Sister     KIDNEY  . Emphysema Sister   . Heart disease Brother   . Heart disease Sister   . Emphysema Sister   . Cancer Sister     BREAST  . Heart disease Brother   . Colon cancer Sister   . Alzheimer's disease Father   . Heart disease Sister   . Tremor Sister   . Tremor Brother   . Alcoholism Child   . Cancer Brother   . Pancreatic cancer Brother   . Diabetes Son     SOCHx:   reports that she has never smoked. She has never used smokeless tobacco. She reports that she does not drink alcohol or use illicit drugs.  ALLERGIES:  Allergies  Allergen Reactions  . Codeine Hives  . Morphine And Related Itching and Other (See Comments)    Makes pt hallucinate  . Actonel [Risedronate Sodium] Other (See Comments)    Abdominal pain  . Fosamax [Alendronate Sodium] Other (See Comments)    Abdominal pian  . Losartan Other (See Comments)    Hospitalized in 06/2013 with pancreatitis, losartan is associated with increased risk of pancreatitis ,  Hence discontinued  . Nitrofurantoin Other (See Comments)    Hospitalized with pancreatitis in 06/2013. Nitrofurantoin implicated as a possible cause    ROS: A comprehensive review of systems was negative except for: Gastrointestinal: positive for pancreatitis, abdominal pain  HOME  MEDS: Current Outpatient Prescriptions  Medication Sig Dispense Refill  . amLODipine (NORVASC) 5 MG tablet TAKE ONE TABLET BY MOUTH ONCE DAILY  90 tablet  0  . aspirin 81 MG tablet Take 81 mg by mouth every other day.       [name] atorvastatin (LIPITOR) 10 MG tablet Take 1 tablet (10 mg total) by mouth daily.  30 tablet  5  . cholecalciferol (VITAMIN D-400) 400 UNITS TABS tablet Take 400 Units by mouth daily.      [name] esomeprazole (NEXIUM) 40 MG capsule Take  40 mg by mouth daily at 12 noon.      . furosemide (LASIX) 20 MG tablet Take 1 tablet (20 mg total) by mouth daily as needed.  14 tablet  0  . magnesium gluconate (MAGONATE) 500 MG tablet Take 500 mg by mouth 2 (two) times daily.      . metoprolol succinate (TOPROL-XL) 50 MG 24 hr tablet TAKE ONE TABLET BY MOUTH ONCE DAILY  30 tablet  2  . Omega-3 Fatty Acids (OMEGA 3 PO) Take 2 capsules by mouth daily.      . potassium chloride SA (K-DUR,KLOR-CON) 20 MEQ tablet Take 1 tablet by mouth daily on days when lasix is taken  14 tablet  0  . topiramate (TOPAMAX) 25 MG tablet One tablet in the morning and two tablets in the evening  90 tablet  11   No current facility-administered medications for this visit.    LABS/IMAGING: No results found for this or any previous visit (from the past 48 hour(s)). No results found.  VITALS: BP 136/62  Pulse 66  Ht 5' 3.5" (1.613 m)  Wt 159 lb 8 oz (72.349 kg)  BMI 27.81 kg/m2  EXAM: General appearance: alert and no distress Neck: no adenopathy, no carotid bruit, no JVD, supple, symmetrical, trachea midline and thyroid not enlarged, symmetric, no tenderness/mass/nodules Lungs: clear to auscultation bilaterally Heart: irregularly irregular rhythm Abdomen: soft, mild TTP in the mid-epigastrium, bowel sounds normal Extremities: extremities normal, atraumatic, no cyanosis or edema Pulses: 2+ and symmetric Skin: Skin color, texture, turgor normal. No rashes or lesions Neurologic: Grossly normal Psych:  Pleasant, mildly anxious  EKG: Atrial fibrillation at 66  ASSESSMENT: 1. Permanent atrial fibrillation 2. Hypertension-controlled 3. Dyslipidemia-off of Vytorin due to cost and concerns about liver dysfunction 4. Falls/imbalance-not a warfarin candidate 5. Pancreatitis  PLAN: 1.   Erin [name] is doing fairly well except for some complaints of intermittent sharp right upper quadrant pain. It appears that she is having bouts of pancreatitis, but is not interested in further imaging to evaluate for possible stone that could be extracted. She continues to have reflux problems and often times has to make herself vomit to feel better. From a cardiac standpoint her A. fib is well controlled. Again she is not on warfarin or a novel oral anticoagulant due to falls which are frequent and significant. She is off of any medications for her cholesterol due to cost and concern about liver dysfunction. Her blood pressure is at goal today. Plan followup in 6 months.  Pixie Casino, MD, [facility] Attending Cardiologist [facility] 01/23/2014, 11:49 AM

## 2014-01-23 NOTE — Patient Instructions (Signed)
Your physician recommends that you schedule a follow-up appointment in: Six months with Dr.Hilty 

## 2014-02-07 LAB — LIPID PANEL
Cholesterol: 206 mg/dL — ABNORMAL HIGH (ref 0–200)
HDL: 44 mg/dL (ref >39)
LDL CALC: 143 mg/dL — AB (ref 0–99)
TRIGLYCERIDES: 94 mg/dL (ref <150)
Total CHOL/HDL Ratio: 4.7 Ratio
VLDL: 19 mg/dL (ref 0–40)

## 2014-02-07 LAB — COMPREHENSIVE METABOLIC PANEL
ALBUMIN: 4 g/dL (ref 3.5–5.2)
ALT: 11 U/L (ref 0–35)
AST: 23 U/L (ref 0–37)
Alkaline Phosphatase: 63 U/L (ref 39–117)
BUN: 15 mg/dL (ref 6–23)
CALCIUM: 9.1 mg/dL (ref 8.4–10.5)
CO2: 27 meq/L (ref 19–32)
CREATININE: 0.76 mg/dL (ref 0.50–1.10)
Chloride: 112 mEq/L (ref 96–112)
GLUCOSE: 110 mg/dL — AB (ref 70–99)
Potassium: 5.1 mEq/L (ref 3.5–5.3)
SODIUM: 146 meq/L — AB (ref 135–145)
TOTAL PROTEIN: 6.1 g/dL (ref 6.0–8.3)
Total Bilirubin: 0.6 mg/dL (ref 0.2–1.2)

## 2014-02-07 LAB — HEMOGLOBIN A1C
HEMOGLOBIN A1C: 5.9 % — AB (ref <5.7)
Mean Plasma Glucose: 123 mg/dL — ABNORMAL HIGH (ref <117)

## 2014-02-07 NOTE — Addendum Note (Signed)
Addended by: Denman George B on: 02/07/2014 08:28 AM   Modules accepted: Orders

## 2014-02-08 LAB — TSH: TSH: 2.862 u[IU]/mL (ref 0.350–4.500)

## 2014-02-08 LAB — VITAMIN D 25 HYDROXY (VIT D DEFICIENCY, FRACTURES): Vit D, 25-Hydroxy: 41 ng/mL (ref 30–89)

## 2014-02-14 ENCOUNTER — Ambulatory Visit (INDEPENDENT_AMBULATORY_CARE_PROVIDER_SITE_OTHER): Payer: Medicare Other | Admitting: Family Medicine

## 2014-02-14 ENCOUNTER — Encounter (INDEPENDENT_AMBULATORY_CARE_PROVIDER_SITE_OTHER): Payer: Self-pay

## 2014-02-14 ENCOUNTER — Encounter: Payer: Self-pay | Admitting: Family Medicine

## 2014-02-14 VITALS — BP 130/62 | HR 63 | Resp 18 | Wt 159.1 lb

## 2014-02-14 DIAGNOSIS — Z23 Encounter for immunization: Secondary | ICD-10-CM | POA: Insufficient documentation

## 2014-02-14 DIAGNOSIS — I482 Chronic atrial fibrillation, unspecified: Secondary | ICD-10-CM

## 2014-02-14 DIAGNOSIS — M810 Age-related osteoporosis without current pathological fracture: Secondary | ICD-10-CM

## 2014-02-14 DIAGNOSIS — R7303 Prediabetes: Secondary | ICD-10-CM

## 2014-02-14 DIAGNOSIS — I4891 Unspecified atrial fibrillation: Secondary | ICD-10-CM

## 2014-02-14 DIAGNOSIS — I10 Essential (primary) hypertension: Secondary | ICD-10-CM

## 2014-02-14 DIAGNOSIS — N3000 Acute cystitis without hematuria: Secondary | ICD-10-CM | POA: Insufficient documentation

## 2014-02-14 DIAGNOSIS — R7309 Other abnormal glucose: Secondary | ICD-10-CM

## 2014-02-14 DIAGNOSIS — M48061 Spinal stenosis, lumbar region without neurogenic claudication: Secondary | ICD-10-CM

## 2014-02-14 DIAGNOSIS — E785 Hyperlipidemia, unspecified: Secondary | ICD-10-CM

## 2014-02-14 MED ORDER — GABAPENTIN 100 MG PO CAPS: 100.0000 mg | ORAL_CAPSULE | Freq: Every day | ORAL | Status: DC

## 2014-02-14 MED ORDER — LOVASTATIN 20 MG PO TABS: 20.0000 mg | ORAL_TABLET | Freq: Every day | ORAL | Status: DC

## 2014-02-14 NOTE — Assessment & Plan Note (Signed)
Vaccine administered.

## 2014-02-14 NOTE — Patient Instructions (Addendum)
F/u end November, call if you need me before  BP is excellent , and you do not have a urinary tract infection  New for cholesterol is lovastatin one at bedtime  New for back pain is gabapentin one at bedtime, we will call in 2 weeks, if this is helping we will suggest you see if 2 will help more, call sooner if needed  Prevnar today   Fasting lipid and CMP in November  Blood sugar is still a little higher than we would like so continue to take most of your sweet in the form of fruit  You are referred to pain specialist for injections  I DO HOPE you feel better soon

## 2014-02-15 ENCOUNTER — Other Ambulatory Visit: Payer: Self-pay | Admitting: Family Medicine

## 2014-02-15 DIAGNOSIS — M48061 Spinal stenosis, lumbar region without neurogenic claudication: Secondary | ICD-10-CM

## 2014-02-15 LAB — POCT URINALYSIS DIPSTICK
BILIRUBIN UA: NEGATIVE
Blood, UA: NEGATIVE
GLUCOSE UA: NEGATIVE
Ketones, UA: NEGATIVE
LEUKOCYTES UA: NEGATIVE
NITRITE UA: NEGATIVE
PH UA: 7
Protein, UA: NEGATIVE
Spec Grav, UA: 1.025
Urobilinogen, UA: 0.2

## 2014-02-16 DIAGNOSIS — E785 Hyperlipidemia, unspecified: Secondary | ICD-10-CM | POA: Insufficient documentation

## 2014-02-16 NOTE — Assessment & Plan Note (Signed)
Deteriorated Patient advised to reduce carb and sweets, commit to regular physical activity, take meds as prescribed, test blood as directed, and attempt to lose weight, to improve blood sugar control.

## 2014-02-16 NOTE — Assessment & Plan Note (Signed)
Urinalysis negative for unfection

## 2014-02-16 NOTE — Assessment & Plan Note (Signed)
Uncontrolled , pain is now debilitating start low dose gabapentin and refer for epidurals , she has benefitied in the past

## 2014-02-16 NOTE — Assessment & Plan Note (Signed)
Treated with once yearly IV bisphophonate

## 2014-02-16 NOTE — Progress Notes (Signed)
   Subjective:    Patient ID: Erin [name], female    DOB: [birth year], 80 y.o.   MRN: [id number]  HPI The PT is here for follow up and re-evaluation of chronic medical conditions, medication management and review of any available recent lab and radiology data.  Preventive health is updated, specifically  Cancer screening and Immunization.   Questions or concerns regarding consultations or procedures which the PT has had in the interim are  Addressed.Recently saw cardiology and has upcoming appt with urology The PT denies any adverse reactions to current medications since the last visit.  There are no new concerns.  C/o increased and debilitating back pain, taking the joy from her life, cant live with this pain, wants help        Review of Systems See HPI Denies recent fever or chills. Denies sinus pressure, nasal congestion, ear pain or sore throat. Denies chest congestion, productive cough or wheezing. Denies chest pains, palpitations and leg swelling Denies abdominal pain, nausea, vomiting,diarrhea or constipation.   Denies dysuria, does have  frequency, and  incontinence. Progressively worsening  joint pain,  and limitation in mobility. Denies headaches, seizures, numbness, or tingling. Denies uncontrolled  depression, anxiety or insomnia. Denies skin break down or rash.        Objective:   Physical Exam BP 130/62  Pulse 63  Resp 18  Wt 159 lb 1.3 oz (72.158 kg)  SpO2 97% Patient alert and oriented and in no cardiopulmonary distress.Pt in pain , less perky at this visit  HEENT: No facial asymmetry, EOMI,   oropharynx pink and moist.  Neck supple no JVD, no mass.  Chest: Clear to auscultation bilaterally.  CVS: S1, S2 no murmurs, no S3.Regular rate.  ABD: Soft non tender.   Ext: No edema  MS: decreased  ROM spine, shoulders, hips and knees.Marked kyphosis in thoracic spine  Skin: Intact, no ulcerations or rash noted.  Psych: Good eye contact, normal  affect. Memory intact not anxious or depressed appearing.  CNS: CN 2-12 intact, power,  normal throughout.no focal deficits noted.        Assessment & Plan:  Need for vaccination with 13-polyvalent pneumococcal conjugate vaccine Vaccine administered  SPINAL STENOSIS, LUMBAR Uncontrolled , pain is now debilitating start low dose gabapentin and refer for epidurals , she has benefitied in the past  Acute cystitis Urinalysis negative for unfection  Osteoporosis, senile Treated with once yearly IV bisphophonate  Prediabetes Deteriorated Patient advised to reduce carb and sweets, commit to regular physical activity, take meds as prescribed, test blood as directed, and attempt to lose weight, to improve blood sugar control.   ESSENTIAL HYPERTENSION, BENIGN Controlled, no change in medication   Atrial fibrillation Chronic and unchanged, she is not a candidate for anticoagulation  Hyperlipidemia with target LDL less than 100 Unchanged, pt now willing to take an "affoirdable  Statin"  Hyperlipidemia:Low fat diet discussed and encouraged.  Lovastatin prescribed

## 2014-02-16 NOTE — Assessment & Plan Note (Signed)
Controlled, no change in medication  

## 2014-02-16 NOTE — Assessment & Plan Note (Signed)
Chronic and unchanged, she is not a candidate for anticoagulation

## 2014-02-16 NOTE — Assessment & Plan Note (Signed)
Unchanged, pt now willing to take an "affoirdable  Statin"  Hyperlipidemia:Low fat diet discussed and encouraged.  Lovastatin prescribed

## 2014-02-22 ENCOUNTER — Ambulatory Visit (INDEPENDENT_AMBULATORY_CARE_PROVIDER_SITE_OTHER): Payer: Medicare Other | Admitting: Urology

## 2014-02-22 DIAGNOSIS — N952 Postmenopausal atrophic vaginitis: Secondary | ICD-10-CM

## 2014-02-22 DIAGNOSIS — N3020 Other chronic cystitis without hematuria: Secondary | ICD-10-CM

## 2014-03-04 ENCOUNTER — Other Ambulatory Visit: Payer: Medicare Other

## 2014-03-06 ENCOUNTER — Ambulatory Visit
Admission: RE | Admit: 2014-03-06 | Discharge: 2014-03-06 | Disposition: A | Discharge status: Home / Self Care | Payer: Medicare PPO | Source: Ambulatory Visit | Attending: Family Medicine | Admitting: Family Medicine

## 2014-03-06 VITALS — BP 151/74 | HR 54

## 2014-03-06 DIAGNOSIS — M48061 Spinal stenosis, lumbar region without neurogenic claudication: Secondary | ICD-10-CM

## 2014-03-06 MED ORDER — IOHEXOL 180 MG/ML  SOLN
1.0000 mL | Freq: Once | INTRAMUSCULAR | Status: AC | PRN
  Administered 2014-03-06: 1 mL via EPIDURAL

## 2014-03-06 MED ORDER — METHYLPREDNISOLONE ACETATE 40 MG/ML INJ SUSP (RADIOLOG
120.0000 mg | Freq: Once | INTRAMUSCULAR | Status: AC
  Administered 2014-03-06: 120 mg via EPIDURAL

## 2014-03-06 NOTE — Discharge Instructions (Signed)

## 2014-03-11 ENCOUNTER — Telehealth: Payer: Self-pay

## 2014-03-11 NOTE — Telephone Encounter (Signed)
Not noted that patient is on any diabetic meds.  Will call and clarify request with patient.

## 2014-03-12 NOTE — Telephone Encounter (Signed)
Pt called wanting to speak with a nurse. Please advise [phone]

## 2014-03-12 NOTE — Telephone Encounter (Signed)
Attempted to call patient.  Phone ringing back busy.

## 2014-03-20 ENCOUNTER — Inpatient Hospital Stay (HOSPITAL_COMMUNITY)
Admission: EM | Admit: 2014-03-20 | Discharge: 2014-03-28 | DRG: 480 | Disposition: A | Discharge status: Rehabilitation Facility | Payer: Medicare Other | Attending: Neurology | Admitting: Neurology

## 2014-03-20 ENCOUNTER — Emergency Department (HOSPITAL_COMMUNITY): Payer: Medicare Other

## 2014-03-20 ENCOUNTER — Encounter (HOSPITAL_COMMUNITY): Payer: Self-pay | Admitting: Emergency Medicine

## 2014-03-20 DIAGNOSIS — R269 Unspecified abnormalities of gait and mobility: Secondary | ICD-10-CM

## 2014-03-20 DIAGNOSIS — W19XXXD Unspecified fall, subsequent encounter: Secondary | ICD-10-CM

## 2014-03-20 DIAGNOSIS — G8190 Hemiplegia, unspecified affecting unspecified side: Secondary | ICD-10-CM | POA: Diagnosis not present

## 2014-03-20 DIAGNOSIS — W19XXXA Unspecified fall, initial encounter: Secondary | ICD-10-CM | POA: Diagnosis present

## 2014-03-20 DIAGNOSIS — G20 Parkinson's disease: Secondary | ICD-10-CM | POA: Diagnosis present

## 2014-03-20 DIAGNOSIS — G2581 Restless legs syndrome: Secondary | ICD-10-CM | POA: Diagnosis present

## 2014-03-20 DIAGNOSIS — Z7982 Long term (current) use of aspirin: Secondary | ICD-10-CM

## 2014-03-20 DIAGNOSIS — Z9181 History of falling: Secondary | ICD-10-CM

## 2014-03-20 DIAGNOSIS — D62 Acute posthemorrhagic anemia: Secondary | ICD-10-CM | POA: Diagnosis not present

## 2014-03-20 DIAGNOSIS — E669 Obesity, unspecified: Secondary | ICD-10-CM | POA: Diagnosis present

## 2014-03-20 DIAGNOSIS — I6340 Cerebral infarction due to embolism of unspecified cerebral artery: Secondary | ICD-10-CM | POA: Diagnosis not present

## 2014-03-20 DIAGNOSIS — G20A1 Parkinson's disease without dyskinesia, without mention of fluctuations: Secondary | ICD-10-CM | POA: Diagnosis present

## 2014-03-20 DIAGNOSIS — K219 Gastro-esophageal reflux disease without esophagitis: Secondary | ICD-10-CM | POA: Diagnosis present

## 2014-03-20 DIAGNOSIS — Z01810 Encounter for preprocedural cardiovascular examination: Secondary | ICD-10-CM

## 2014-03-20 DIAGNOSIS — G43909 Migraine, unspecified, not intractable, without status migrainosus: Secondary | ICD-10-CM | POA: Diagnosis present

## 2014-03-20 DIAGNOSIS — I4891 Unspecified atrial fibrillation: Secondary | ICD-10-CM | POA: Diagnosis present

## 2014-03-20 DIAGNOSIS — Z79899 Other long term (current) drug therapy: Secondary | ICD-10-CM | POA: Diagnosis not present

## 2014-03-20 DIAGNOSIS — S72143A Displaced intertrochanteric fracture of unspecified femur, initial encounter for closed fracture: Secondary | ICD-10-CM | POA: Diagnosis present

## 2014-03-20 DIAGNOSIS — I482 Chronic atrial fibrillation, unspecified: Secondary | ICD-10-CM

## 2014-03-20 DIAGNOSIS — M25559 Pain in unspecified hip: Secondary | ICD-10-CM | POA: Diagnosis present

## 2014-03-20 DIAGNOSIS — H9190 Unspecified hearing loss, unspecified ear: Secondary | ICD-10-CM | POA: Diagnosis present

## 2014-03-20 DIAGNOSIS — E785 Hyperlipidemia, unspecified: Secondary | ICD-10-CM

## 2014-03-20 DIAGNOSIS — Z66 Do not resuscitate: Secondary | ICD-10-CM

## 2014-03-20 DIAGNOSIS — R1310 Dysphagia, unspecified: Secondary | ICD-10-CM | POA: Diagnosis not present

## 2014-03-20 DIAGNOSIS — Y92009 Unspecified place in unspecified non-institutional (private) residence as the place of occurrence of the external cause: Secondary | ICD-10-CM

## 2014-03-20 DIAGNOSIS — I639 Cerebral infarction, unspecified: Secondary | ICD-10-CM

## 2014-03-20 DIAGNOSIS — S72142A Displaced intertrochanteric fracture of left femur, initial encounter for closed fracture: Secondary | ICD-10-CM

## 2014-03-20 DIAGNOSIS — R471 Dysarthria and anarthria: Secondary | ICD-10-CM | POA: Diagnosis not present

## 2014-03-20 DIAGNOSIS — I10 Essential (primary) hypertension: Secondary | ICD-10-CM

## 2014-03-20 DIAGNOSIS — R29810 Facial weakness: Secondary | ICD-10-CM | POA: Diagnosis not present

## 2014-03-20 HISTORY — DX: Localized edema: R60.0

## 2014-03-20 HISTORY — DX: Essential (primary) hypertension: I10

## 2014-03-20 LAB — CBC WITH DIFFERENTIAL/PLATELET
Basophils Absolute: 0 10*3/uL (ref 0.0–0.1)
Basophils Relative: 0 % (ref 0–1)
Eosinophils Absolute: 0.1 10*3/uL (ref 0.0–0.7)
Eosinophils Relative: 1 % (ref 0–5)
HCT: 38.2 % (ref 36.0–46.0)
Hemoglobin: 12.7 g/dL (ref 12.0–15.0)
Lymphocytes Relative: 25 % (ref 12–46)
Lymphs Abs: 2.5 10*3/uL (ref 0.7–4.0)
MCH: 32.8 pg (ref 26.0–34.0)
MCHC: 33.2 g/dL (ref 30.0–36.0)
MCV: 98.7 fL (ref 78.0–100.0)
Monocytes Absolute: 0.6 10*3/uL (ref 0.1–1.0)
Monocytes Relative: 6 % (ref 3–12)
Neutro Abs: 6.9 10*3/uL (ref 1.7–7.7)
Neutrophils Relative %: 68 % (ref 43–77)
Platelets: 193 10*3/uL (ref 150–400)
RBC: 3.87 MIL/uL (ref 3.87–5.11)
RDW: 13.5 % (ref 11.5–15.5)
WBC: 10 10*3/uL (ref 4.0–10.5)

## 2014-03-20 LAB — URINALYSIS, ROUTINE W REFLEX MICROSCOPIC
Bilirubin Urine: NEGATIVE
Glucose, UA: NEGATIVE mg/dL
Hgb urine dipstick: NEGATIVE
KETONES UR: NEGATIVE mg/dL
Nitrite: NEGATIVE
Protein, ur: NEGATIVE mg/dL
SPECIFIC GRAVITY, URINE: 1.015 (ref 1.005–1.030)
Urobilinogen, UA: 0.2 mg/dL (ref 0.0–1.0)
pH: 7.5 (ref 5.0–8.0)

## 2014-03-20 LAB — BASIC METABOLIC PANEL
Anion gap: 11 (ref 5–15)
BUN: 20 mg/dL (ref 6–23)
CHLORIDE: 106 meq/L (ref 96–112)
CO2: 26 meq/L (ref 19–32)
Calcium: 8.9 mg/dL (ref 8.4–10.5)
Creatinine, Ser: 0.98 mg/dL (ref 0.50–1.10)
GFR calc Af Amer: 61 mL/min — ABNORMAL LOW (ref >90)
GFR calc non Af Amer: 52 mL/min — ABNORMAL LOW (ref >90)
Glucose, Bld: 128 mg/dL — ABNORMAL HIGH (ref 70–99)
Potassium: 4.5 mEq/L (ref 3.7–5.3)
SODIUM: 143 meq/L (ref 137–147)

## 2014-03-20 LAB — PROTIME-INR
INR: 1.12 (ref 0.00–1.49)
INR: 1.15 (ref 0.00–1.49)
PROTHROMBIN TIME: 14.7 s (ref 11.6–15.2)
Prothrombin Time: 14.4 seconds (ref 11.6–15.2)

## 2014-03-20 LAB — URINE MICROSCOPIC-ADD ON

## 2014-03-20 MED ORDER — METOPROLOL SUCCINATE ER 50 MG PO TB24
50.0000 mg | ORAL_TABLET | Freq: Every day | ORAL | Status: DC
  Administered 2014-03-21: 50 mg via ORAL
  Filled 2014-03-20 (×2): qty 1

## 2014-03-20 MED ORDER — TOPIRAMATE 25 MG PO TABS
25.0000 mg | ORAL_TABLET | Freq: Every day | ORAL | Status: DC
  Administered 2014-03-21: 25 mg via ORAL
  Filled 2014-03-20 (×5): qty 1

## 2014-03-20 MED ORDER — FENTANYL CITRATE 0.05 MG/ML IJ SOLN
50.0000 ug | INTRAMUSCULAR | Status: DC | PRN
  Administered 2014-03-21 (×3): 50 ug via INTRAVENOUS
  Filled 2014-03-20 (×3): qty 2

## 2014-03-20 MED ORDER — POTASSIUM CHLORIDE CRYS ER 20 MEQ PO TBCR
20.0000 meq | EXTENDED_RELEASE_TABLET | Freq: Every day | ORAL | Status: DC
  Administered 2014-03-21: 20 meq via ORAL
  Filled 2014-03-20 (×2): qty 1

## 2014-03-20 MED ORDER — FENTANYL CITRATE 0.05 MG/ML IJ SOLN
50.0000 ug | Freq: Once | INTRAMUSCULAR | Status: AC
Start: 2014-03-20 — End: 2014-03-20
  Administered 2014-03-20: 50 ug via INTRAVENOUS
  Filled 2014-03-20: qty 2

## 2014-03-20 MED ORDER — CHOLECALCIFEROL 10 MCG (400 UNIT) PO TABS
400.0000 [IU] | ORAL_TABLET | Freq: Every day | ORAL | Status: DC
  Administered 2014-03-21: 400 [IU] via ORAL
  Filled 2014-03-20 (×2): qty 1

## 2014-03-20 MED ORDER — AMLODIPINE BESYLATE 5 MG PO TABS
5.0000 mg | ORAL_TABLET | Freq: Every day | ORAL | Status: DC
  Administered 2014-03-21 – 2014-03-22 (×2): 5 mg via ORAL
  Filled 2014-03-20 (×2): qty 1

## 2014-03-20 MED ORDER — SODIUM CHLORIDE 0.9 % IV SOLN: 20.0000 mL | INTRAVENOUS | Status: DC

## 2014-03-20 MED ORDER — TOPIRAMATE 25 MG PO TABS
50.0000 mg | ORAL_TABLET | Freq: Every day | ORAL | Status: DC
  Administered 2014-03-20 – 2014-03-21 (×2): 50 mg via ORAL
  Filled 2014-03-20 (×5): qty 2

## 2014-03-20 MED ORDER — TOPIRAMATE 25 MG PO TABS: 25.0000 mg | ORAL_TABLET | Freq: Two times a day (BID) | ORAL | Status: DC

## 2014-03-20 MED ORDER — METHOCARBAMOL 1000 MG/10ML IJ SOLN
500.0000 mg | Freq: Four times a day (QID) | INTRAMUSCULAR | Status: DC | PRN
  Filled 2014-03-20: qty 5

## 2014-03-20 MED ORDER — ONDANSETRON HCL 4 MG/2ML IJ SOLN
4.0000 mg | Freq: Three times a day (TID) | INTRAMUSCULAR | Status: DC | PRN
  Administered 2014-03-21: 4 mg via INTRAVENOUS
  Filled 2014-03-20: qty 2

## 2014-03-20 MED ORDER — MAGNESIUM GLUCONATE 500 MG PO TABS
500.0000 mg | ORAL_TABLET | Freq: Two times a day (BID) | ORAL | Status: DC
  Filled 2014-03-20 (×4): qty 1

## 2014-03-20 MED ORDER — METHOCARBAMOL 500 MG PO TABS
500.0000 mg | ORAL_TABLET | Freq: Four times a day (QID) | ORAL | Status: DC | PRN
  Administered 2014-03-21: 500 mg via ORAL
  Filled 2014-03-20: qty 1

## 2014-03-20 MED ORDER — FENTANYL CITRATE 0.05 MG/ML IJ SOLN
50.0000 ug | Freq: Once | INTRAMUSCULAR | Status: AC
  Administered 2014-03-20: 50 ug via INTRAVENOUS
  Filled 2014-03-20: qty 2

## 2014-03-20 MED ORDER — GABAPENTIN 100 MG PO CAPS
100.0000 mg | ORAL_CAPSULE | Freq: Every day | ORAL | Status: DC
  Administered 2014-03-20 – 2014-03-21 (×2): 100 mg via ORAL
  Filled 2014-03-20 (×2): qty 1

## 2014-03-20 MED ORDER — MAGNESIUM OXIDE 400 (241.3 MG) MG PO TABS
400.0000 mg | ORAL_TABLET | Freq: Every day | ORAL | Status: DC
  Administered 2014-03-20 – 2014-03-21 (×2): 400 mg via ORAL
  Filled 2014-03-20 (×3): qty 1

## 2014-03-20 MED ORDER — TOPIRAMATE 25 MG PO TABS
ORAL_TABLET | ORAL | Status: AC
  Filled 2014-03-20: qty 2

## 2014-03-20 NOTE — ED Notes (Signed)
Attempted report 

## 2014-03-20 NOTE — ED Provider Notes (Signed)
CSN: [id number]     Arrival date & time 03/20/14  1705 History  This chart was scribed for Sharyon Cable, MD by Delphia Grates, ED Scribe. This patient was seen in room APA11/APA11 and the patient's care was started at 5:08 PM.    Chief Complaint  Patient presents with  . Hip Pain     Patient is a 80 y.o. female presenting with hip pain. The history is provided by the patient. No language interpreter was used.  Hip Pain This is a new problem. The current episode started 1 to 2 hours ago. The problem has not changed since onset.Pertinent negatives include no chest pain, no abdominal pain, no headaches and no shortness of breath. She has tried nothing for the symptoms.    HPI Comments: Erin [name] is a 80 y.o. female, history of arthritis, anxiety, hyperlipidemia, A-fib, Parkinson disease, HTN, osteoporosis, and frequent falls, who presents to the Emergency Department complaining of constant left hip pain onset PTA when she lost her balance and fell while trying to prepare dinner. Patient states she has an unsteady gait and frequently loses her balance. She states the pain is worsened with movement. She denies head injury, LOC, neck pain, CP, or new back pain. She denies history of left hip surgery.   Past Medical History  Diagnosis Date  . Anxiety   . Arthritis   . Hyperlipidemia   . Hypertension   . Osteoporosis   . GERD (gastroesophageal reflux disease)   . Hearing loss   . Restless leg   . Constipation   . Tremor   . Swelling of limb     Leg Swelling  . Obese   . Dyslipidemia   . Gall bladder disease   . Cataract   . Carpal tunnel syndrome of left wrist   . Atrial fibrillation   . Sleep apnea   . Esophageal dilatation   . Frequent falls   . Parkinson disease   . History of nuclear stress test 08/31/2012    lexiscan; negative for ischemia  . Polyneuropathy in other diseases classified elsewhere 10/03/2013  . HOH (hard of hearing)    Past Surgical History   Procedure Laterality Date  . Appendectomy    . Cholecystectomy    . Abdominal hysterectomy    . Fracture surgery      X 4 LEFT ARM  . Benign cyst removed from kidney and lung, bilateral mastectomy    . Bilateral great toenail removal    . Knee arthroscopy  2012    right knee, Dr Aline Brochure  . Left forearm    . Breast surgery      bilateral 2000approx  . Tonsillectomy    . R hand surgery    . Bronchoscopy  02/15/2000  . Right vats.  "  . Right upper lobe wedge resection.  "  . Upper gastrointestinal endoscopy  11/25/1999    Esophagitis/ Normal proximal esophagus, stomach and duodenum  . Colonoscopy  01/17/2009    Dr. Deatra Ina : Internal hemorrhoids/Diverticula, scattered in the ascending colon/  Moderate diverticulosis ascending colon to sigmoid colon  . Esophagogastroduodenoscopy   04/23/2003    Dr. Deatra Ina: HIATAL HERNIA  . Colonoscopy  01/16/2001    Normal  . Cataract extraction, bilateral    . Esophagogastroduodenoscopy  03/20/2012    CHY:IFOYDXAJ ring-LIKELY CAUSING MILD DYSPHAGIA/SMALL hiatal hernia/Multiple sessile polyps ranging between 3-87mm , path benign  . Cataract extraction    . Carpal tunnel release  left wrist  . Transthoracic echocardiogram  06/24/2009    EF=>55%, mild assymetric LVH; LA mildly dilated; mild mitral annular calcif, borderline MVP, mild-mod MR; mild-mod TR, RV systolic pressure elevated, mild pulm HTN; AV mildly sclerotic; mild pulm valve regurg - ordered r/t bradycardia   . Endovenous ablation saphenous vein w/ laser  01/2011    right GSV  . Cholecystectomy     Family History  Problem Relation Age of Onset  . Diabetes Mother   . Heart disease Mother   . Stroke Mother   . Cancer Sister     KIDNEY  . Emphysema Sister   . Heart disease Brother   . Heart disease Sister   . Emphysema Sister   . Cancer Sister     BREAST  . Heart disease Brother   . Colon cancer Sister   . Alzheimer's disease Father   . Heart disease Sister   . Tremor  Sister   . Tremor Brother   . Alcoholism Child   . Cancer Brother   . Pancreatic cancer Brother   . Diabetes Son    History  Substance Use Topics  . Smoking status: Never Smoker   . Smokeless tobacco: Never Used  . Alcohol Use: No   OB History   Grav Para Term Preterm Abortions TAB SAB Ect Mult Living                 Review of Systems  Respiratory: Negative for shortness of breath.   Cardiovascular: Negative for chest pain.  Gastrointestinal: Negative for abdominal pain.  Musculoskeletal: Positive for arthralgias (left hip).  Neurological: Negative for headaches.  All other systems reviewed and are negative.     Allergies  Codeine; Morphine and related; Actonel; Fosamax; Losartan; and Nitrofurantoin  Home Medications   Prior to Admission medications   Medication Sig Start Date End Date Taking? Authorizing Provider  amLODipine (NORVASC) 5 MG tablet TAKE ONE TABLET BY MOUTH ONCE DAILY    Fayrene Helper, MD  aspirin 81 MG tablet Take 81 mg by mouth every other day.     Historical Provider, MD  atorvastatin (LIPITOR) 10 MG tablet Take 1 tablet (10 mg total) by mouth daily. 10/15/13   Fayrene Helper, MD  cholecalciferol (VITAMIN D-400) 400 UNITS TABS tablet Take 400 Units by mouth daily.    Historical Provider, MD  esomeprazole (NEXIUM) 40 MG capsule Take 40 mg by mouth daily at 12 noon.    Historical Provider, MD  furosemide (LASIX) 20 MG tablet Take 1 tablet (20 mg total) by mouth daily as needed. 09/21/13   Fayrene Helper, MD  gabapentin (NEURONTIN) 100 MG capsule Take 1 capsule (100 mg total) by mouth at bedtime. 02/14/14   Fayrene Helper, MD  lovastatin (MEVACOR) 20 MG tablet Take 1 tablet (20 mg total) by mouth at bedtime. 02/14/14   Fayrene Helper, MD  magnesium gluconate (MAGONATE) 500 MG tablet Take 500 mg by mouth 2 (two) times daily.    Historical Provider, MD  metoprolol succinate (TOPROL-XL) 50 MG 24 hr tablet TAKE ONE TABLET BY MOUTH ONCE DAILY     Fayrene Helper, MD  Omega-3 Fatty Acids (OMEGA 3 PO) Take 2 capsules by mouth daily.    Historical Provider, MD  potassium chloride SA (K-DUR,KLOR-CON) 20 MEQ tablet Take 1 tablet by mouth daily on days when lasix is taken 09/21/13   Fayrene Helper, MD  topiramate (TOPAMAX) 25 MG tablet One tablet in the morning  and two tablets in the evening 10/03/13   Kathrynn Ducking, MD   Triage Vitals: BP 141/68  Pulse 88  Temp(Src) 97.8 F (36.6 C) (Oral)  Resp 18  Ht 5\' 6"  (1.676 m)  Wt 151 lb (68.493 kg)  BMI 24.38 kg/m2  SpO2 98%  Physical Exam  Nursing note and vitals reviewed.  CONSTITUTIONAL: Well developed/well nourished HEAD: Normocephalic/atraumatic EYES: EOMI/PERRL ENMT: Mucous membranes moist NECK: supple no meningeal signs SPINE:no cervical spine tenderness CV: S1/S2 noted,  LUNGS: Lungs are clear to auscultation bilaterally, no apparent distress ABDOMEN: soft, nontender, no rebound or guarding NEURO: Pt is awake/alert, moves all extremitiesx4 EXTREMITIES: pulses normal, full ROM. Tenderness with ROM of left hip, left lower extremity is externally rotated.  Distal pulses equal and intact SKIN: warm, color normal PSYCH: no abnormalities of mood noted   ED Course  Procedures  DIAGNOSTIC STUDIES: Oxygen Saturation is 98% on room air, normal by my interpretation.    COORDINATION OF CARE: At 1713 Discussed treatment plan with patient which includes imaging. Patient agrees.  7:37 PM D/w dr Aline Brochure He requests admit to medicine.  Surgery likely in 2 days D/w dr Shanon Brow with [facility] will admit to medical service Pt otherwise stable and denies any other complaints at this time  [place] - Abnormal; Notable for the following:    Glucose, Bld 128 (*)    GFR calc non Af Amer 52 (*)    GFR calc Af Amer 61 (*)    All other components within normal limits  CBC WITH DIFFERENTIAL  PROTIME-INR  TYPE AND SCREEN    Imaging Review Dg  Chest 1 View  03/20/2014   CLINICAL DATA:  [place].  Left hip pain.  History of hypertension.  EXAM: CHEST - 1 VIEW  COMPARISON:  06/05/2013  FINDINGS: The heart is mildly enlarged but stable. There is tortuosity and calcification of the thoracic aorta. The lungs demonstrate chronic emphysematous changes and pulmonary scarring. No definite acute overlying pulmonary process. No pleural effusion or pneumothorax. The bony thorax is intact.  IMPRESSION: Chronic lung changes but no acute pulmonary findings.   Electronically Signed   By: Kalman Jewels M.D.   On: 03/20/2014 18:41   Dg Hip Complete Left  03/20/2014   CLINICAL DATA:  [place].  Left hip pain.  EXAM: LEFT HIP - COMPLETE 2+ VIEW  COMPARISON:  None.  FINDINGS: There is a displaced intertrochanteric fracture of the left hip with a varus deformity. The right hip is intact. The pubic symphysis and SI joints are intact. The pubic rami are intact.  IMPRESSION: Displaced intertrochanteric fracture of the left hip.   Electronically Signed   By: Kalman Jewels M.D.   On: 03/20/2014 18:26     EKG Interpretation   Date/Time:  Wednesday March 20 2014 17:18:01 EDT Ventricular Rate:  74 PR Interval:    QRS Duration: 112 QT Interval:  409 QTC Calculation: 553 R Axis:   68 Text Interpretation:  Atrial flutter with varied AV block, Borderline  intraventricular conduction delay Nonspecific T abnrm, anterolateral leads  artifact noted Confirmed by [place] ([id number]) on 03/20/2014  5:33:06 PM      MDM   Final diagnoses:  Fracture, intertrochanteric, left femur, closed, initial encounter  Chronic atrial fibrillation    Nursing notes including past medical history and social history reviewed and considered in documentation xrays reviewed and considered Labs/vital reviewed and considered   I personally performed the  services described in this documentation, which was scribed in my presence. The recorded information has been reviewed and is  accurate.      Sharyon Cable, MD 03/20/14 [phone]

## 2014-03-20 NOTE — ED Notes (Signed)
Pt states she has a unsteady gait and she looses her balance a lot. States she lost her balance today and fell on the floor. States her left hip was hurting earlier but not know

## 2014-03-20 NOTE — H&P (Signed)
PCP:   Tula Nakayama, MD   Chief Complaint:  Fall, left hip pain  HPI: 80 yo female h/o freq falls, cafib, parkinsons comes in after loosing her balance and falling this afternoon.  She lives with her son.  She hurt her left hip and came in because of left hip pain.  No LOC.  No recent illnesses.  Not anticoagulated due to her high fall risk.  Review of Systems:  Positive and negative as per HPI otherwise all other systems are negative  Past Medical History: Past Medical History  Diagnosis Date  . Anxiety   . Arthritis   . Hyperlipidemia   . Hypertension   . Osteoporosis   . GERD (gastroesophageal reflux disease)   . Hearing loss   . Restless leg   . Constipation   . Tremor   . Swelling of limb     Leg Swelling  . Obese   . Dyslipidemia   . Gall bladder disease   . Cataract   . Carpal tunnel syndrome of left wrist   . Atrial fibrillation   . Sleep apnea   . Esophageal dilatation   . Frequent falls   . Parkinson disease   . History of nuclear stress test 08/31/2012    lexiscan; negative for ischemia  . Polyneuropathy in other diseases classified elsewhere 10/03/2013  . HOH (hard of hearing)    Past Surgical History  Procedure Laterality Date  . Appendectomy    . Cholecystectomy    . Abdominal hysterectomy    . Fracture surgery      X 4 LEFT ARM  . Benign cyst removed from kidney and lung, bilateral mastectomy    . Bilateral great toenail removal    . Knee arthroscopy  2012    right knee, Dr Aline Brochure  . Left forearm    . Breast surgery      bilateral 2000approx  . Tonsillectomy    . R hand surgery    . Bronchoscopy  02/15/2000  . Right vats.  "  . Right upper lobe wedge resection.  "  . Upper gastrointestinal endoscopy  11/25/1999    Esophagitis/ Normal proximal esophagus, stomach and duodenum  . Colonoscopy  01/17/2009    Dr. Deatra Ina : Internal hemorrhoids/Diverticula, scattered in the ascending colon/  Moderate diverticulosis ascending colon to  sigmoid colon  . Esophagogastroduodenoscopy   04/23/2003    Dr. Deatra Ina: HIATAL HERNIA  . Colonoscopy  01/16/2001    Normal  . Cataract extraction, bilateral    . Esophagogastroduodenoscopy  03/20/2012    PIR:JJOACZYS ring-LIKELY CAUSING MILD DYSPHAGIA/SMALL hiatal hernia/Multiple sessile polyps ranging between 3-51mm , path benign  . Cataract extraction    . Carpal tunnel release      left wrist  . Transthoracic echocardiogram  06/24/2009    EF=>55%, mild assymetric LVH; LA mildly dilated; mild mitral annular calcif, borderline MVP, mild-mod MR; mild-mod TR, RV systolic pressure elevated, mild pulm HTN; AV mildly sclerotic; mild pulm valve regurg - ordered r/t bradycardia   . Endovenous ablation saphenous vein w/ laser  01/2011    right GSV  . Cholecystectomy      Medications: Prior to Admission medications   Medication Sig Start Date End Date Taking? Authorizing Provider  amLODipine (NORVASC) 5 MG tablet Take 5 mg by mouth daily.   Yes Historical Provider, MD  aspirin EC 81 MG tablet Take 81 mg by mouth every other day.   Yes Historical Provider, MD  cholecalciferol (VITAMIN D-400) 400 UNITS  TABS tablet Take 400 Units by mouth daily.   Yes Historical Provider, MD  gabapentin (NEURONTIN) 100 MG capsule Take 1 capsule (100 mg total) by mouth at bedtime. 02/14/14  Yes Fayrene Helper, MD  lovastatin (MEVACOR) 20 MG tablet Take 1 tablet (20 mg total) by mouth at bedtime. 02/14/14  Yes Fayrene Helper, MD  magnesium gluconate (MAGONATE) 500 MG tablet Take 500 mg by mouth 2 (two) times daily.   Yes Historical Provider, MD  metoprolol succinate (TOPROL-XL) 50 MG 24 hr tablet Take 50 mg by mouth daily. Take with or immediately following a meal.   Yes Historical Provider, MD  potassium chloride SA (K-DUR,KLOR-CON) 20 MEQ tablet Take 1 tablet by mouth daily on days when lasix is taken 09/21/13  Yes Fayrene Helper, MD  potassium chloride SA (K-DUR,KLOR-CON) 20 MEQ tablet Take 20 mEq by mouth  daily.   Yes Historical Provider, MD  topiramate (TOPAMAX) 25 MG tablet Take 25-50 mg by mouth 2 (two) times daily. Patient takes 1 tablet in the morning and 2 tablets at bedtime.   Yes Historical Provider, MD  furosemide (LASIX) 20 MG tablet Take 1 tablet (20 mg total) by mouth daily as needed. 09/21/13   Fayrene Helper, MD    Allergies:   Allergies  Allergen Reactions  . Codeine Hives  . Morphine And Related Itching and Other (See Comments)    Makes pt hallucinate  . Actonel [Risedronate Sodium] Other (See Comments)    Abdominal pain  . Fosamax [Alendronate Sodium] Other (See Comments)    Abdominal pian  . Losartan Other (See Comments)    Hospitalized in 06/2013 with pancreatitis, losartan is associated with increased risk of pancreatitis ,  Hence discontinued  . Nitrofurantoin Other (See Comments)    Hospitalized with pancreatitis in 06/2013. Nitrofurantoin implicated as a possible cause    Social History:  reports that she has never smoked. She has never used smokeless tobacco. She reports that she does not drink alcohol or use illicit drugs.  Family History: Family History  Problem Relation Age of Onset  . Diabetes Mother   . Heart disease Mother   . Stroke Mother   . Cancer Sister     KIDNEY  . Emphysema Sister   . Heart disease Brother   . Heart disease Sister   . Emphysema Sister   . Cancer Sister     BREAST  . Heart disease Brother   . Colon cancer Sister   . Alzheimer's disease Father   . Heart disease Sister   . Tremor Sister   . Tremor Brother   . Alcoholism Child   . Cancer Brother   . Pancreatic cancer Brother   . Diabetes Son     Physical Exam: Filed Vitals:   03/20/14 1745 03/20/14 1815 03/20/14 1830 03/20/14 2000  BP:   172/79 131/96  Pulse: 56   57  Temp:    97.8 F (36.6 C)  TempSrc:    Oral  Resp: 20 22 20 20   Height:      Weight:      SpO2: 99%   96%   General appearance: alert, cooperative and no distress Head: Normocephalic,  without obvious abnormality, atraumatic Eyes: negative Nose: Nares normal. Septum midline. Mucosa normal. No drainage or sinus tenderness. Neck: no JVD and supple, symmetrical, trachea midline Lungs: clear to auscultation bilaterally Heart: regular rate and rhythm, S1, S2 normal, no murmur, click, rub or gallop Abdomen: soft, non-tender; bowel sounds normal;  no masses,  no organomegaly Extremities: extremities normal, atraumatic, no cyanosis or edema Pulses: 2+ and symmetric Skin: Skin color, texture, turgor normal. No rashes or lesions Neurologic: Grossly normal  Labs on Admission:   Recent Labs  03/20/14 1721  NA 143  K 4.5  CL 106  CO2 26  GLUCOSE 128*  BUN 20  CREATININE 0.98  CALCIUM 8.9    Recent Labs  03/20/14 1721  WBC 10.0  NEUTROABS 6.9  HGB 12.7  HCT 38.2  MCV 98.7  PLT 193    Radiological Exams on Admission: Dg Chest 1 View  03/20/2014   CLINICAL DATA:  [place].  Left hip pain.  History of hypertension.  EXAM: CHEST - 1 VIEW  COMPARISON:  06/05/2013  FINDINGS: The heart is mildly enlarged but stable. There is tortuosity and calcification of the thoracic aorta. The lungs demonstrate chronic emphysematous changes and pulmonary scarring. No definite acute overlying pulmonary process. No pleural effusion or pneumothorax. The bony thorax is intact.  IMPRESSION: Chronic lung changes but no acute pulmonary findings.   Electronically Signed   By: Kalman Jewels M.D.   On: 03/20/2014 18:41   Dg Hip Complete Left  03/20/2014   CLINICAL DATA:  [place].  Left hip pain.  EXAM: LEFT HIP - COMPLETE 2+ VIEW  COMPARISON:  None.  FINDINGS: There is a displaced intertrochanteric fracture of the left hip with a varus deformity. The right hip is intact. The pubic symphysis and SI joints are intact. The pubic rami are intact.  IMPRESSION: Displaced intertrochanteric fracture of the left hip.   Electronically Signed   By: Kalman Jewels M.D.   On: 03/20/2014 18:26     Assessment/Plan  80 yo female with mechanical fall and left displaced intertrochanteric fracture  Principal Problem:   Fracture, intertrochanteric, left femur-   Keep npo after midnight.  Hip pathway.  Iv pain meds, muscle relaxer and antiemetic prn.   Dr Aline Brochure notified and will see in am.  Hold anticoagulants for dvt prophylaxis for now.  preop ekg aflutter rate controlled.  Cont her bblocker perioperatively.  Active Problems:   Essential hypertension, benign   DNR (do not resuscitate)   Atrial fibrillation   Fall at home  Pt is DNR paperwork at bedside.  Admit to med surg.  Pt requesting a teaspoon of mustard for her leg cramps, have asked the nurse to provide her with this if possible before midnight.  Consuello Lassalle A 03/20/2014, 8:24 PM

## 2014-03-21 ENCOUNTER — Encounter (HOSPITAL_COMMUNITY): Payer: Self-pay | Admitting: Cardiology

## 2014-03-21 DIAGNOSIS — I10 Essential (primary) hypertension: Secondary | ICD-10-CM

## 2014-03-21 DIAGNOSIS — E785 Hyperlipidemia, unspecified: Secondary | ICD-10-CM

## 2014-03-21 DIAGNOSIS — W19XXXA Unspecified fall, initial encounter: Secondary | ICD-10-CM

## 2014-03-21 DIAGNOSIS — Y92009 Unspecified place in unspecified non-institutional (private) residence as the place of occurrence of the external cause: Secondary | ICD-10-CM

## 2014-03-21 DIAGNOSIS — I4891 Unspecified atrial fibrillation: Secondary | ICD-10-CM

## 2014-03-21 DIAGNOSIS — Z01810 Encounter for preprocedural cardiovascular examination: Secondary | ICD-10-CM

## 2014-03-21 LAB — CBC
HCT: 35.7 % — ABNORMAL LOW (ref 36.0–46.0)
Hemoglobin: 11.9 g/dL — ABNORMAL LOW (ref 12.0–15.0)
MCH: 32.7 pg (ref 26.0–34.0)
MCHC: 33.3 g/dL (ref 30.0–36.0)
MCV: 98.1 fL (ref 78.0–100.0)
PLATELETS: 181 10*3/uL (ref 150–400)
RBC: 3.64 MIL/uL — AB (ref 3.87–5.11)
RDW: 13.3 % (ref 11.5–15.5)
WBC: 13.3 10*3/uL — AB (ref 4.0–10.5)

## 2014-03-21 LAB — BASIC METABOLIC PANEL
ANION GAP: 11 (ref 5–15)
BUN: 17 mg/dL (ref 6–23)
CO2: 24 mEq/L (ref 19–32)
Calcium: 8.4 mg/dL (ref 8.4–10.5)
Chloride: 106 mEq/L (ref 96–112)
Creatinine, Ser: 0.65 mg/dL (ref 0.50–1.10)
GFR, EST NON AFRICAN AMERICAN: 80 mL/min — AB (ref >90)
Glucose, Bld: 139 mg/dL — ABNORMAL HIGH (ref 70–99)
POTASSIUM: 4 meq/L (ref 3.7–5.3)
SODIUM: 141 meq/L (ref 137–147)

## 2014-03-21 LAB — SURGICAL PCR SCREEN
MRSA, PCR: NEGATIVE
STAPHYLOCOCCUS AUREUS: NEGATIVE

## 2014-03-21 LAB — ABO/RH: ABO/RH(D): A POS

## 2014-03-21 LAB — PREPARE RBC (CROSSMATCH)

## 2014-03-21 MED ORDER — FENTANYL CITRATE 0.05 MG/ML IJ SOLN
50.0000 ug | Freq: Four times a day (QID) | INTRAMUSCULAR | Status: DC | PRN
  Administered 2014-03-23 – 2014-03-25 (×7): 50 ug via INTRAVENOUS
  Filled 2014-03-21 (×7): qty 2

## 2014-03-21 MED ORDER — CHLORHEXIDINE GLUCONATE 4 % EX LIQD: 60.0000 mL | Freq: Once | CUTANEOUS | Status: DC

## 2014-03-21 MED ORDER — CEFAZOLIN SODIUM-DEXTROSE 2-3 GM-% IV SOLR
2.0000 g | INTRAVENOUS | Status: DC
  Filled 2014-03-21: qty 50

## 2014-03-21 MED ORDER — OXYCODONE HCL 5 MG PO TABS
5.0000 mg | ORAL_TABLET | Freq: Four times a day (QID) | ORAL | Status: DC | PRN
  Administered 2014-03-21 (×2): 5 mg via ORAL
  Filled 2014-03-21 (×2): qty 1

## 2014-03-21 NOTE — Progress Notes (Signed)
Patient was recently active with [facility] services. Spoke with [facility] Coordinator who indicates patient is fiercely independent and will likely state she can go home after hospital discharge. If indicated and recommended per therapy and MD, patient would benefit from short term SNF as she is usually the one that cares for everyone else in her family per [facility]. Will continue to follow and re-engage patient for [facility]. Will alert inpatient RNCM of the above and patient's situation at home.  Marthenia Rolling, MSN- RN,BSN- [facility] [phone]

## 2014-03-21 NOTE — Consult Note (Signed)
CARDIOLOGY CONSULT NOTE   Patient ID: CRYSTA GULICK MRN: [id number] DOB/AGE: 1930/02/13 80 y.o.  Admit Date: 03/20/2014 Referring Physician:PTH Primary Physician: Tula Nakayama, MD Consulting Cardiologist: Rozann Lesches MD Primary Cardiologist: Lyman Bishop MD Reason for Consultation: Pre-Op Evaluation for Left femur fracture with hx of atrial fib  Clinical Summary Ms. Deetz is an 80 y.o.female patient with known history of atrial fib, not a anticoagulation candidate due to frequent falls, hypertension, hyperlipidemia, and Parkinson's Disease, who presented to ER after mechanical fall while in her kitchen, sustaining a displaced intertrochanteric fracture of the left hip. She is followed by Dr. Debara Pickett and was last seen by Dr. Debara Pickett in July of 2015, found to be stable with well controlled HR.   She denied chest pain, palpitations, or dizziness prior to her fall. She states that she was cooking and turned to pre-heat her oven and lost her balance injuring her left leg. On arrival to ER, BP 141/68, EKG coarse atrial fibrillation with rates in the 70's. Labs were essentially unremarkable. CXR demonstrated chronic lung changes, no acute pulmonary findings. She was treated with fentanyl injection.   She is currently complaining of bilateral leg cramps, which are chronic for her.    Allergies  Allergen Reactions  . Codeine Hives  . Morphine And Related Itching and Other (See Comments)    Makes pt hallucinate  . Actonel [Risedronate Sodium] Other (See Comments)    Abdominal pain  . Fosamax [Alendronate Sodium] Other (See Comments)    Abdominal pian  . Losartan Other (See Comments)    Hospitalized in 06/2013 with pancreatitis, losartan is associated with increased risk of pancreatitis ,  Hence discontinued  . Nitrofurantoin Other (See Comments)    Hospitalized with pancreatitis in 06/2013. Nitrofurantoin implicated as a possible cause    Medications Scheduled Medications: .  amLODipine  5 mg Oral Daily  . cholecalciferol  400 Units Oral Daily  . gabapentin  100 mg Oral QHS  . magnesium oxide  400 mg Oral Daily  . metoprolol succinate  50 mg Oral Daily  . potassium chloride SA  20 mEq Oral Daily  . topiramate  25 mg Oral Daily   And  . topiramate  50 mg Oral QHS    PRN Medications: fentaNYL, methocarbamol (ROBAXIN) IV, methocarbamol, ondansetron (ZOFRAN) IV   Past Medical History  Diagnosis Date  . Anxiety   . Arthritis   . Hyperlipidemia   . Essential hypertension, benign   . Osteoporosis   . GERD (gastroesophageal reflux disease)   . Hearing loss   . Restless leg   . Tremor   . Leg edema   . Obese   . Gall bladder disease   . Cataract   . Carpal tunnel syndrome of left wrist   . Atrial fibrillation   . Sleep apnea   . Esophageal dilatation   . Frequent falls   . Parkinson disease   . History of nuclear stress test 08/31/2012    Lexiscan cardiolite negative for ischemia  . Polyneuropathy in other diseases classified elsewhere 10/03/2013    Past Surgical History  Procedure Laterality Date  . Appendectomy    . Cholecystectomy    . Abdominal hysterectomy    . Fracture surgery      Left arm x4  . Benign cyst removed from kidney and lung, bilateral mastectomy    . Bilateral great toenail removal    . Knee arthroscopy  2012    Right knee, Dr Aline Brochure  .  Left forearm    . Breast surgery      Bilateral 2000  . Tonsillectomy    . R hand surgery    . Bronchoscopy  02/15/2000  . Right vats.  "  . Right upper lobe wedge resection.  "  . Upper gastrointestinal endoscopy  11/25/1999    Esophagitis/ Normal proximal esophagus, stomach and duodenum  . Colonoscopy  01/17/2009    Dr. Deatra Ina : Internal hemorrhoids/Diverticula, scattered in the ascending colon/  Moderate diverticulosis ascending colon to sigmoid colon  . Esophagogastroduodenoscopy   04/23/2003    Dr. Deatra Ina: HIATAL HERNIA  . Colonoscopy  01/16/2001    Normal  . Cataract  extraction, bilateral    . Esophagogastroduodenoscopy  03/20/2012    JYN:WGNFAOZH ring-LIKELY CAUSING MILD DYSPHAGIA/SMALL hiatal hernia/Multiple sessile polyps ranging between 3-83mm , path benign  . Cataract extraction    . Carpal tunnel release      Left wrist  . Transthoracic echocardiogram  06/24/2009    EF=>55%, mild assymetric LVH; LA mildly dilated; mild mitral annular calcif, borderline MVP, mild-mod MR; mild-mod TR, RV systolic pressure elevated, mild pulm HTN; AV mildly sclerotic; mild pulm valve regurg - ordered r/t bradycardia   . Endovenous ablation saphenous vein w/ laser  01/2011    Right GSV  . Cholecystectomy      Family History  Problem Relation Age of Onset  . Diabetes Mother   . Heart disease Mother   . Stroke Mother   . Cancer Sister     KIDNEY  . Emphysema Sister   . Heart disease Brother   . Heart disease Sister   . Emphysema Sister   . Cancer Sister     BREAST  . Heart disease Brother   . Colon cancer Sister   . Alzheimer's disease Father   . Heart disease Sister   . Tremor Sister   . Tremor Brother   . Alcoholism Child   . Cancer Brother   . Pancreatic cancer Brother   . Diabetes Son     Social History Ms. Sarr reports that she has never smoked. She has never used smokeless tobacco. Ms. Spiewak reports that she does not drink alcohol.  Review of Systems Other systems reviewed and negative except as outlined.  Physical Examination Blood pressure 153/83, pulse 79, temperature 97.9 F (36.6 C), temperature source Oral, resp. rate 18, height 5\' 6"  (1.676 m), weight 158 lb 1.6 oz (71.714 kg), SpO2 99.00%.  Intake/Output Summary (Last 24 hours) at 03/21/14 0927 Last data filed at 03/21/14 0700  Gross per 24 hour  Intake    200 ml  Output      0 ml  Net    200 ml    Telemetry: To be placed.  GEN: No distress. HEENT: Conjunctiva and lids normal, oropharynx clear. Neck: Supple, no elevated JVP or carotid bruits, no thyromegaly. Lungs:  Clear to auscultation, nonlabored breathing at rest per anterior auscultation. Cardiac: Iregular rate and rhythm, no S3 or significant systolic murmur, no pericardial rub. Abdomen: Soft, nontender, bowel sounds present, no guarding or rebound. Extremities: No pitting edema, distal pulses 2+.Rigfht leg wrapped in gauze, left left externally rotated. Skin: Warm and dry. Musculoskeletal: No kyphosis. Neuropsychiatric: Alert and oriented x3, affect grossly appropriate.   Prior Cardiac Testing/Procedures  Lab Results  Basic Metabolic Panel:  Recent Labs Lab 03/20/14 1721 03/21/14 0516  NA 143 141  K 4.5 4.0  CL 106 106  CO2 26 24  GLUCOSE 128* 139*  BUN  20 17  CREATININE 0.98 0.65  CALCIUM 8.9 8.4    CBC:  Recent Labs Lab 03/20/14 1721 03/21/14 0516  WBC 10.0 13.3*  NEUTROABS 6.9  --   HGB 12.7 11.9*  HCT 38.2 35.7*  MCV 98.7 98.1  PLT 193 181    Echocardiogram:08/2008 (Transcribed from scanned document) There is a mild symmetric left ventricular hypertrophy Left ventricular systolic function is normal EF of =.>55% LA is mildly dilated There is boarderline mitral valve prolaspe Prolapse of the anterior mitral leaflet Mild to moderate mitral regurgitation Mild mitral annular calcification Mild to moderate TR Right ventricular pressure elevated at 39 mmHg Mild pulmonary hypertension AoV mildly sclerotic   NM Stress Test: 08/2012 Findings: SPECT imaging demonstrates no reversible or irreversible  defects to suggest ischemia or infarct.  Quantitative gated analysis shows normal wall motion.  The resting left ventricular ejection fraction is 78% with end-  diastolic volume of 55 ml and end-systolic volume of 12 ml.  IMPRESSION:  No evidence of ischemia or infarction.  Ejection fraction 78%.   Radiology: Dg Chest 1 View  03/20/2014   CLINICAL DATA:  [place].  Left hip pain.  History of hypertension.  EXAM: CHEST - 1 VIEW  COMPARISON:  06/05/2013  FINDINGS: The  heart is mildly enlarged but stable. There is tortuosity and calcification of the thoracic aorta. The lungs demonstrate chronic emphysematous changes and pulmonary scarring. No definite acute overlying pulmonary process. No pleural effusion or pneumothorax. The bony thorax is intact.  IMPRESSION: Chronic lung changes but no acute pulmonary findings.   Electronically Signed   By: Kalman Jewels M.D.   On: 03/20/2014 18:41   Dg Hip Complete Left  03/20/2014   CLINICAL DATA:  [place].  Left hip pain.  EXAM: LEFT HIP - COMPLETE 2+ VIEW  COMPARISON:  None.  FINDINGS: There is a displaced intertrochanteric fracture of the left hip with a varus deformity. The right hip is intact. The pubic symphysis and SI joints are intact. The pubic rami are intact.  IMPRESSION: Displaced intertrochanteric fracture of the left hip.   Electronically Signed   By: Kalman Jewels M.D.   On: 03/20/2014 18:26    ECG: Probable course atrial fibrillation versus atypical flutter with nonspecific ST-T changes.   Impression and Recommendations  1. S/P Left  intertrochanteric fracture of the left hip: Occuring after mechanical fall while in her kitchen. No chest pain, near syncope or dizziness associated with this. She is to undergo surgical repair of left hip. From cardiac standpoint she is at an acceptable risk to proceed without further testing. She does not endorse any angina or palpitations, no obvious heart failure symptoms. She had a negative ischemic workup within the last 2 years and evidence of normal LVEF. Would place her on telemetry, continue beta blocker for heart rate control.  2. Chronic atrial fibrillation: Heart rate is controlled on metoprolol XL 50 mg daily. CHADS VASC score of 4 but she is not a candidate for anticoagulation due to history of frequent falls; most recent fall sustaining femur fx for which he is currently being evaluated.  3. Essential Hypertension: Currently moderately controlled. She continues to  have bilateral leg pain which may be contributing. Continue amlodipine along with metoprolol. Can consider titration up as necessary post-operatively.   4. Hyperlipidemia: She is on lovastatin at home. Will restart unless leg cramping is considered myalgia response.   Signed: Phill Myron. Lawrence NP  03/21/2014, 9:27 AM Co-Sign MD   Attending note:  Patient seen and examined. Reviewed records and modified above note by Ms. Lawrence NP. Ms. Tucci presents after a mechanical fall without syncope resulting in left intertrochanteric fracture. She is pending surgical repair. From a cardiac perspective she has been stable with chronic atrial fibrillation that is heart rate controlled, not anticoagulated with high fall risk and increased risk of bleeding. She does not endorse any angina symptoms or heart failure symptoms, had a negative ischemic workup within the last 2 years, and documentation of normal LVEF at that time. She should be able to proceed at an acceptable perioperative cardiac risk, would place her on telemetry, continue beta blocker and Norvasc.  Satira Sark, M.D., F.A.C.C.

## 2014-03-21 NOTE — Consult Note (Signed)
Reason for Consult: Fractured left hip Referring Physician:   ELLSIE VIOLETTE is an 80 y.o. female.  HPI: 80 year old female was in her home had recently come from the store turned around lost her balance fell and injured her left hip. She complains of left hip pain dull severe only if she tries to move her leg times one day cannot weight-bear.  She does have multiple medical problems which are stable she has atrial fibrillation without anticoagulation secondary to frequent falls and risk of bleeding  review  of systems she does have various skin lesions. Muscle and joint pain. Denies painful urination heartburn occasional shortness of breath occasional palpitation denies headache. Denies double vision. Easy bruising yes excessive thirst no food reaction no unsteady gait yes   Past Medical History  Diagnosis Date  . Anxiety   . Arthritis   . Hyperlipidemia   . Hypertension   . Osteoporosis   . GERD (gastroesophageal reflux disease)   . Hearing loss   . Restless leg   . Constipation   . Tremor   . Swelling of limb     Leg Swelling  . Obese   . Dyslipidemia   . Gall bladder disease   . Cataract   . Carpal tunnel syndrome of left wrist   . Atrial fibrillation   . Sleep apnea   . Esophageal dilatation   . Frequent falls   . Parkinson disease   . History of nuclear stress test 08/31/2012    lexiscan; negative for ischemia  . Polyneuropathy in other diseases classified elsewhere 10/03/2013  . HOH (hard of hearing)     Past Surgical History  Procedure Laterality Date  . Appendectomy    . Cholecystectomy    . Abdominal hysterectomy    . Fracture surgery      X 4 LEFT ARM  . Benign cyst removed from kidney and lung, bilateral mastectomy    . Bilateral great toenail removal    . Knee arthroscopy  2012    right knee, Dr Aline Brochure  . Left forearm    . Breast surgery      bilateral 2000approx  . Tonsillectomy    . R hand surgery    . Bronchoscopy  02/15/2000  . Right vats.  "   . Right upper lobe wedge resection.  "  . Upper gastrointestinal endoscopy  11/25/1999    Esophagitis/ Normal proximal esophagus, stomach and duodenum  . Colonoscopy  01/17/2009    Dr. Deatra Ina : Internal hemorrhoids/Diverticula, scattered in the ascending colon/  Moderate diverticulosis ascending colon to sigmoid colon  . Esophagogastroduodenoscopy   04/23/2003    Dr. Deatra Ina: HIATAL HERNIA  . Colonoscopy  01/16/2001    Normal  . Cataract extraction, bilateral    . Esophagogastroduodenoscopy  03/20/2012    HWE:XHBZJIRC ring-LIKELY CAUSING MILD DYSPHAGIA/SMALL hiatal hernia/Multiple sessile polyps ranging between 3-70m , path benign  . Cataract extraction    . Carpal tunnel release      left wrist  . Transthoracic echocardiogram  06/24/2009    EF=>55%, mild assymetric LVH; LA mildly dilated; mild mitral annular calcif, borderline MVP, mild-mod MR; mild-mod TR, RV systolic pressure elevated, mild pulm HTN; AV mildly sclerotic; mild pulm valve regurg - ordered r/t bradycardia   . Endovenous ablation saphenous vein w/ laser  01/2011    right GSV  . Cholecystectomy      Family History  Problem Relation Age of Onset  . Diabetes Mother   . Heart disease Mother   .  Stroke Mother   . Cancer Sister     KIDNEY  . Emphysema Sister   . Heart disease Brother   . Heart disease Sister   . Emphysema Sister   . Cancer Sister     BREAST  . Heart disease Brother   . Colon cancer Sister   . Alzheimer's disease Father   . Heart disease Sister   . Tremor Sister   . Tremor Brother   . Alcoholism Child   . Cancer Brother   . Pancreatic cancer Brother   . Diabetes Son     Social History:  reports that she has never smoked. She has never used smokeless tobacco. She reports that she does not drink alcohol or use illicit drugs.  Allergies:  Allergies  Allergen Reactions  . Codeine Hives  . Morphine And Related Itching and Other (See Comments)    Makes pt hallucinate  . Actonel [Risedronate  Sodium] Other (See Comments)    Abdominal pain  . Fosamax [Alendronate Sodium] Other (See Comments)    Abdominal pian  . Losartan Other (See Comments)    Hospitalized in 06/2013 with pancreatitis, losartan is associated with increased risk of pancreatitis ,  Hence discontinued  . Nitrofurantoin Other (See Comments)    Hospitalized with pancreatitis in 06/2013. Nitrofurantoin implicated as a possible cause    Medications: I have reviewed the patient's current medications.  Results for orders placed during the hospital encounter of 03/20/14 (from the past 48 hour(s))  BASIC METABOLIC PANEL     Status: Abnormal   Collection Time    03/20/14  5:21 PM      Result Value Ref Range   Sodium 143  137 - 147 mEq/L   Potassium 4.5  3.7 - 5.3 mEq/L   Chloride 106  96 - 112 mEq/L   CO2 26  19 - 32 mEq/L   Glucose, Bld 128 (*) 70 - 99 mg/dL   BUN 20  6 - 23 mg/dL   Creatinine, Ser 0.98  0.50 - 1.10 mg/dL   Calcium 8.9  8.4 - 10.5 mg/dL   GFR calc non Af Amer 52 (*) >90 mL/min   GFR calc Af Amer 61 (*) >90 mL/min   Comment: (NOTE)     The eGFR has been calculated using the CKD EPI equation.     This calculation has not been validated in all clinical situations.     eGFR's persistently <90 mL/min signify possible Chronic Kidney     Disease.   Anion gap 11  5 - 15  CBC WITH DIFFERENTIAL     Status: None   Collection Time    03/20/14  5:21 PM      Result Value Ref Range   WBC 10.0  4.0 - 10.5 K/uL   RBC 3.87  3.87 - 5.11 MIL/uL   Hemoglobin 12.7  12.0 - 15.0 g/dL   HCT 38.2  36.0 - 46.0 %   MCV 98.7  78.0 - 100.0 fL   MCH 32.8  26.0 - 34.0 pg   MCHC 33.2  30.0 - 36.0 g/dL   RDW 13.5  11.5 - 15.5 %   Platelets 193  150 - 400 K/uL   Neutrophils Relative % 68  43 - 77 %   Neutro Abs 6.9  1.7 - 7.7 K/uL   Lymphocytes Relative 25  12 - 46 %   Lymphs Abs 2.5  0.7 - 4.0 K/uL   Monocytes Relative 6  3 - 12 %  Monocytes Absolute 0.6  0.1 - 1.0 K/uL   Eosinophils Relative 1  0 - 5 %    Eosinophils Absolute 0.1  0.0 - 0.7 K/uL   Basophils Relative 0  0 - 1 %   Basophils Absolute 0.0  0.0 - 0.1 K/uL  PROTIME-INR     Status: None   Collection Time    03/20/14  5:21 PM      Result Value Ref Range   Prothrombin Time 14.4  11.6 - 15.2 seconds   INR 1.12  0.00 - 1.49  TYPE AND SCREEN     Status: None   Collection Time    03/20/14  5:21 PM      Result Value Ref Range   ABO/RH(D) A POS     Antibody Screen NEG     Sample Expiration 03/23/2014    PROTIME-INR     Status: None   Collection Time    03/20/14  8:57 PM      Result Value Ref Range   Prothrombin Time 14.7  11.6 - 15.2 seconds   INR 1.15  0.00 - 1.49  URINALYSIS, ROUTINE W REFLEX MICROSCOPIC     Status: Abnormal   Collection Time    03/20/14 10:15 PM      Result Value Ref Range   Color, Urine YELLOW  YELLOW   APPearance HAZY (*) CLEAR   Specific Gravity, Urine 1.015  1.005 - 1.030   pH 7.5  5.0 - 8.0   Glucose, UA NEGATIVE  NEGATIVE mg/dL   Hgb urine dipstick NEGATIVE  NEGATIVE   Bilirubin Urine NEGATIVE  NEGATIVE   Ketones, ur NEGATIVE  NEGATIVE mg/dL   Protein, ur NEGATIVE  NEGATIVE mg/dL   Urobilinogen, UA 0.2  0.0 - 1.0 mg/dL   Nitrite NEGATIVE  NEGATIVE   Leukocytes, UA TRACE (*) NEGATIVE  SURGICAL PCR SCREEN     Status: None   Collection Time    03/20/14 10:15 PM      Result Value Ref Range   MRSA, PCR NEGATIVE  NEGATIVE   Staphylococcus aureus NEGATIVE  NEGATIVE   Comment:            The Xpert SA Assay (FDA     approved for NASAL specimens     in patients over 75 years of age),     is one component of     a comprehensive surveillance     program.  Test performance has     been validated by [employer] for patients greater     than or equal to 36 year old.     It is not intended     to diagnose infection nor to     guide or monitor treatment.  URINE MICROSCOPIC-ADD ON     Status: Abnormal   Collection Time    03/20/14 10:15 PM      Result Value Ref Range   Squamous Epithelial /  LPF MANY (*) RARE   WBC, UA 0-2  <3 WBC/hpf   Bacteria, UA MANY (*) RARE   Urine-Other AMORPHOUS URATES/PHOSPHATES    BASIC METABOLIC PANEL     Status: Abnormal   Collection Time    03/21/14  5:16 AM      Result Value Ref Range   Sodium 141  137 - 147 mEq/L   Potassium 4.0  3.7 - 5.3 mEq/L   Chloride 106  96 - 112 mEq/L   CO2 24  19 - 32 mEq/L  Glucose, Bld 139 (*) 70 - 99 mg/dL   BUN 17  6 - 23 mg/dL   Creatinine, Ser 0.65  0.50 - 1.10 mg/dL   Comment: DELTA CHECK NOTED   Calcium 8.4  8.4 - 10.5 mg/dL   GFR calc non Af Amer 80 (*) >90 mL/min   GFR calc Af Amer >90  >90 mL/min   Comment: (NOTE)     The eGFR has been calculated using the CKD EPI equation.     This calculation has not been validated in all clinical situations.     eGFR's persistently <90 mL/min signify possible Chronic Kidney     Disease.   Anion gap 11  5 - 15  CBC     Status: Abnormal   Collection Time    03/21/14  5:16 AM      Result Value Ref Range   WBC 13.3 (*) 4.0 - 10.5 K/uL   RBC 3.64 (*) 3.87 - 5.11 MIL/uL   Hemoglobin 11.9 (*) 12.0 - 15.0 g/dL   HCT 35.7 (*) 36.0 - 46.0 %   MCV 98.1  78.0 - 100.0 fL   MCH 32.7  26.0 - 34.0 pg   MCHC 33.3  30.0 - 36.0 g/dL   RDW 13.3  11.5 - 15.5 %   Platelets 181  150 - 400 K/uL    Dg Chest 1 View  03/20/2014   CLINICAL DATA:  [place].  Left hip pain.  History of hypertension.  EXAM: CHEST - 1 VIEW  COMPARISON:  06/05/2013  FINDINGS: The heart is mildly enlarged but stable. There is tortuosity and calcification of the thoracic aorta. The lungs demonstrate chronic emphysematous changes and pulmonary scarring. No definite acute overlying pulmonary process. No pleural effusion or pneumothorax. The bony thorax is intact.  IMPRESSION: Chronic lung changes but no acute pulmonary findings.   Electronically Signed   By: Kalman Jewels M.D.   On: 03/20/2014 18:41   Dg Hip Complete Left  03/20/2014   CLINICAL DATA:  [place].  Left hip pain.  EXAM: LEFT HIP - COMPLETE 2+ VIEW   COMPARISON:  None.  FINDINGS: There is a displaced intertrochanteric fracture of the left hip with a varus deformity. The right hip is intact. The pubic symphysis and SI joints are intact. The pubic rami are intact.  IMPRESSION: Displaced intertrochanteric fracture of the left hip.   Electronically Signed   By: Kalman Jewels M.D.   On: 03/20/2014 18:26    ROS Blood pressure 153/83, pulse 79, temperature 97.9 F (36.6 C), temperature source Oral, resp. rate 18, height 5' 6" (1.676 m), weight 158 lb 1.6 oz (71.714 kg), SpO2 99.00%. Physical Exam General the patient is well-developed and well-nourished grooming and hygiene are normal Oriented x3 Mood and affect normal Ambulation unable to ambulate secondary to hip fracture and left hip pain  Has no major deformities other than arthritic nodules in the upper extremities. Muscle tone is normal. All joints are reduced stable. Skin shows multiple skin lesions some subcutaneous hemorrhage no specific or severe tenderness  Right lower extremity is nontender normal range of motion all joints are stable skin multiple lesions as noted nothing open. Strength and muscle tone normal.  Distal pulses are intact no peripheral edema no lymph nodes normal sensation no pathologic reflexes balance could not be assessed coordination of the upper extremities limbs normal Cardiovascular exam is normal Sensory exam normal  She is tenderness and swelling in the left proximal thigh with external rotation posture of  the limb knee joint is stable muscle tone is normal skin is intact  Assessment/Plan: 3-4 part intertrochanteric fracture left hip better assessment once reduced. Recommend compression screw internal fixation. Risks and benefits explained.  Open treatment internal fixation left hip after medical stabilization and clearance  Arther Abbott 03/21/2014, 7:24 AM

## 2014-03-21 NOTE — Clinical Social Work Psychosocial (Signed)
Clinical Social Work Department BRIEF PSYCHOSOCIAL ASSESSMENT 03/21/2014  Patient:  Erin [name], Erin [name]     Account Number:  [id number]     Admit date:  03/20/2014  Clinical Social Worker:  Wyatt Haste  Date/Time:  03/21/2014 11:57 AM  Referred by:  Physician  Date Referred:  03/21/2014 Referred for  SNF Placement   Other Referral:   Interview type:  Patient Other interview type:    PSYCHOSOCIAL DATA Living Status:  FAMILY Admitted from facility:   Level of care:   Primary support name:  Dale/Charlotte/Melody Primary support relationship to patient:  FAMILY Degree of support available:   supportive    CURRENT CONCERNS Current Concerns  Post-Acute Placement   Other Concerns:    SOCIAL WORK ASSESSMENT / PLAN CSW met with pt at bedside. Pt alert and oriented and reports she was in her kitchen cooking when she turned and lost her balance. She has fractured hip and is awaiting surgery. Pt reports her grandson was there and called EMS as she was unable to get up. Her son lives with her, but he is in and out during the day. At baseline, pt is independent with ADLs. She still drives. Pt admits she tends to fall a lot and has broken her arms in the past. CSW discussed d/c planning with pt. She is aware that most likely she will need to go to SNF after surgery. Pt states, "I was afraid I would have to go to rehab." She has been to facilities in [place] to visit other people. Aware of Medicare coverage/criteria. Pt requests [facility] placement. SNF list left in room. When asked if CSW could call family, pt replied that it was her decision so that was not necessary.   Assessment/plan status:  Psychosocial Support/Ongoing Assessment of Needs Other assessment/ plan:   Information/referral to community resources:   SNF list    PATIENT'S/FAMILY'S RESPONSE TO PLAN OF CARE: Pt is agreeable to CSW initiating bed search in [place]. Will follow up with pt.       Benay Pike,  [place]

## 2014-03-21 NOTE — Clinical Social Work Placement (Signed)
Clinical Social Work Department CLINICAL SOCIAL WORK PLACEMENT NOTE 03/21/2014  Patient:  Erin [name], Erin [name]  Account Number:  [id number] Admit date:  03/20/2014  Clinical Social Worker:  Benay Pike, LCSW  Date/time:  03/21/2014 11:55 AM  Clinical Social Work is seeking post-discharge placement for this patient at the following level of care:   [place]   (*CSW will update this form in Epic as items are completed)   03/21/2014  Patient/family provided with [place] Department of Clinical Social Work's list of facilities offering this level of care within the geographic area requested by the patient (or if unable, by the patient's family).  03/21/2014  Patient/family informed of their freedom to choose among providers that offer the needed level of care, that participate in Medicare, Medicaid or managed care program needed by the patient, have an available bed and are willing to accept the patient.  03/21/2014  Patient/family informed of [facility]' ownership interest in [facility], as well as of the fact that they are under no obligation to receive care at this facility.  PASARR submitted to EDS on 03/21/2014 PASARR number received on 03/21/2014  FL2 transmitted to all facilities in geographic area requested by pt/family on  03/21/2014 FL2 transmitted to all facilities within larger geographic area on   Patient informed that his/her managed care company has contracts with or will negotiate with  certain facilities, including the following:     Patient/family informed of bed offers received:   Patient chooses bed at  Physician recommends and patient chooses bed at    Patient to be transferred to  on   Patient to be transferred to facility by  Patient and family notified of transfer on  Name of family member notified:    The following physician request were entered in Epic:   Additional Comments:  Benay Pike, [place]

## 2014-03-21 NOTE — Progress Notes (Signed)
[facility] PROGRESS NOTE  Erin [name] ZJQ:[id number] DOB: [birth year] DOA: 03/20/2014 PCP: Tula Nakayama, MD  Assessment/Plan: 1-left hip fracture: treatment per orthopedics service -patient is clear from cardiology stand point -will follow rec's  2-chronic atrial fibrillation: rate controlled. -will continue metoprolol -start telemetry  3-essential HTN: fairly well controlled. Especially with setting of pain. -continue metoprolol and norvasc -continue low sodium diet -control pain -adjust medications if needed  4-HLD: continue statins  5-hx of migraines: continue topamax  Code Status: DNR Family Communication: no family at bedside Disposition Plan: to be determine; given age 80 and rehab needs, might required SNF at discharge   Consultants:  Cardiology  Orthopedic service (Dr. Aline Brochure)  Procedures: Anticipated hip surgery on 9/11  Antibiotics:  None   HPI/Subjective: Feeling ok, no CP, SOB or fever. Mild pain on her left leg with movement   Objective: Filed Vitals:   03/21/14 1200  BP:   Pulse:   Temp:   Resp: 18    Intake/Output Summary (Last 24 hours) at 03/21/14 1431 Last data filed at 03/21/14 0700  Gross per 24 hour  Intake    200 ml  Output      0 ml  Net    200 ml   Filed Weights   03/20/14 1714 03/20/14 2107  Weight: 68.493 kg (151 lb) 71.714 kg (158 lb 1.6 oz)    Exam:   General:  Feeling ok, main complaint is mild pain on her left hip with movement and some muscles cramps  Cardiovascular: irregular, rate controlled, no rubs or gallops  Respiratory: good air movement, no wheezing or crackles  Abdomen: soft, NT, ND, positive BS  Musculoskeletal: decrease range of motion on her left leg; no cyanosis   Data Reviewed: Basic Metabolic Panel:  Recent Labs Lab 03/20/14 1721 03/21/14 0516  NA 143 141  K 4.5 4.0  CL 106 106  CO2 26 24  GLUCOSE 128* 139*  BUN 20 17  CREATININE 0.98 0.65  CALCIUM 8.9 8.4    CBC:  Recent Labs Lab 03/20/14 1721 03/21/14 0516  WBC 10.0 13.3*  NEUTROABS 6.9  --   HGB 12.7 11.9*  HCT 38.2 35.7*  MCV 98.7 98.1  PLT 193 181   CBG: No results found for this basename: GLUCAP,  in the last 168 hours  Recent Results (from the past 240 hour(s))  SURGICAL PCR SCREEN     Status: None   Collection Time    03/20/14 10:15 PM      Result Value Ref Range Status   MRSA, PCR NEGATIVE  NEGATIVE Final   Staphylococcus aureus NEGATIVE  NEGATIVE Final   Comment:            The Xpert SA Assay (FDA     approved for NASAL specimens     in patients over 2 years of age),     is one component of     a comprehensive surveillance     program.  Test performance has     been validated by [employer] for patients greater     than or equal to 54 year old.     It is not intended     to diagnose infection nor to     guide or monitor treatment.     Studies: Dg Chest 1 View  03/20/2014   CLINICAL DATA:  [place].  Left hip pain.  History of hypertension.  EXAM: CHEST - 1 VIEW  COMPARISON:  06/05/2013  FINDINGS: The heart is mildly enlarged but stable. There is tortuosity and calcification of the thoracic aorta. The lungs demonstrate chronic emphysematous changes and pulmonary scarring. No definite acute overlying pulmonary process. No pleural effusion or pneumothorax. The bony thorax is intact.  IMPRESSION: Chronic lung changes but no acute pulmonary findings.   Electronically Signed   By: Kalman Jewels M.D.   On: 03/20/2014 18:41   Dg Hip Complete Left  03/20/2014   CLINICAL DATA:  [place].  Left hip pain.  EXAM: LEFT HIP - COMPLETE 2+ VIEW  COMPARISON:  None.  FINDINGS: There is a displaced intertrochanteric fracture of the left hip with a varus deformity. The right hip is intact. The pubic symphysis and SI joints are intact. The pubic rami are intact.  IMPRESSION: Displaced intertrochanteric fracture of the left hip.   Electronically Signed   By: Kalman Jewels M.D.   On:  03/20/2014 18:26    Scheduled Meds: . amLODipine  5 mg Oral Daily  . chlorhexidine  60 mL Topical Once  . cholecalciferol  400 Units Oral Daily  . gabapentin  100 mg Oral QHS  . magnesium oxide  400 mg Oral Daily  . metoprolol succinate  50 mg Oral Daily  . potassium chloride SA  20 mEq Oral Daily  . topiramate  25 mg Oral Daily   And  . topiramate  50 mg Oral QHS   Continuous Infusions:   Principal Problem:   Fracture, intertrochanteric, left femur Active Problems:   Essential hypertension, benign   DNR (do not resuscitate)   Atrial fibrillation   Fall at home   Preoperative cardiovascular examination    Time spent: >30 minutes    Barton Dubois  [facility] Pager [phone]. If 7PM-7AM, please contact night-coverage at [email or web address], password [facility] 03/21/2014, 2:31 PM  LOS: 1 day

## 2014-03-22 ENCOUNTER — Inpatient Hospital Stay (HOSPITAL_COMMUNITY): Payer: Medicare Other

## 2014-03-22 ENCOUNTER — Encounter (HOSPITAL_COMMUNITY): Payer: Self-pay | Admitting: Anesthesiology

## 2014-03-22 ENCOUNTER — Encounter (HOSPITAL_COMMUNITY): Payer: Self-pay | Admitting: Radiology

## 2014-03-22 ENCOUNTER — Encounter (HOSPITAL_COMMUNITY)
Admission: EM | Disposition: A | Discharge status: Rehabilitation Facility | Payer: Self-pay | Source: Home / Self Care | Attending: Neurology

## 2014-03-22 LAB — GLUCOSE, CAPILLARY
GLUCOSE-CAPILLARY: 124 mg/dL — AB (ref 70–99)
GLUCOSE-CAPILLARY: 98 mg/dL (ref 70–99)
Glucose-Capillary: 107 mg/dL — ABNORMAL HIGH (ref 70–99)

## 2014-03-22 LAB — URINE CULTURE
Colony Count: NO GROWTH
Culture: NO GROWTH

## 2014-03-22 LAB — MRSA PCR SCREENING: MRSA by PCR: NEGATIVE

## 2014-03-22 SURGERY — COMPRESSION HIP
Anesthesia: Choice | Laterality: Left

## 2014-03-22 MED ORDER — ASPIRIN 300 MG RE SUPP
300.0000 mg | Freq: Every day | RECTAL | Status: DC
  Administered 2014-03-22 – 2014-03-23 (×2): 300 mg via RECTAL
  Filled 2014-03-22 (×2): qty 1

## 2014-03-22 MED ORDER — IOHEXOL 350 MG/ML SOLN
50.0000 mL | Freq: Once | INTRAVENOUS | Status: AC | PRN
  Administered 2014-03-22: 100 mL via INTRAVENOUS

## 2014-03-22 MED ORDER — CEFAZOLIN SODIUM-DEXTROSE 2-3 GM-% IV SOLR
2.0000 g | INTRAVENOUS | Status: AC
  Administered 2014-03-23: 2 g via INTRAVENOUS
  Filled 2014-03-22: qty 50

## 2014-03-22 MED ORDER — SODIUM CHLORIDE 0.9 % IV SOLN
INTRAVENOUS | Status: AC
  Administered 2014-03-22: 100 mL/h via INTRAVENOUS

## 2014-03-22 MED ORDER — METOPROLOL TARTRATE 1 MG/ML IV SOLN
2.5000 mg | Freq: Four times a day (QID) | INTRAVENOUS | Status: DC
  Administered 2014-03-22 – 2014-03-23 (×3): 2.5 mg via INTRAVENOUS
  Filled 2014-03-22 (×7): qty 5

## 2014-03-22 NOTE — Consult Note (Signed)
Referring Physician: [place]    Chief Complaint: stroke  HPI:                                                                                                                                         Erin [name] is an 80 y.o. female who lives at home and had a fall resulting in left hip fracture.  She was brought to [facility].  She was noted to be confused at that time but today staff noted she had a left sided deficit.  Code stroke was not called due to CT findings showing > 1/3 right MCA infarct. Patient was transferred to [facility] hospital for further evaluation and possible endovascular intervention. On arrival patient was alert, able to stated name and follow minimal commands.  She showed a left sided paresis and right gaze preference. IR was called and they recommended CT perfusion to evaluate for mismatch.   Date last known well: Date: 03/21/2014 Time last known well: Unable to determine there was a change noted at 10:30 this AM but it was also noted she was more confused yesterday.  tPA Given: No: out of the window  Past Medical History  Diagnosis Date  . Anxiety   . Arthritis   . Hyperlipidemia   . Essential hypertension, benign   . Osteoporosis   . GERD (gastroesophageal reflux disease)   . Hearing loss   . Restless leg   . Tremor   . Leg edema   . Obese   . Gall bladder disease   . Cataract   . Carpal tunnel syndrome of left wrist   . Atrial fibrillation   . Sleep apnea   . Esophageal dilatation   . Frequent falls   . Parkinson disease   . History of nuclear stress test 08/31/2012    Lexiscan cardiolite negative for ischemia  . Polyneuropathy in other diseases classified elsewhere 10/03/2013    Past Surgical History  Procedure Laterality Date  . Appendectomy    . Cholecystectomy    . Abdominal hysterectomy    . Fracture surgery      Left arm x4  . Benign cyst removed from kidney and lung, bilateral mastectomy    . Bilateral great toenail removal    . Knee  arthroscopy  2012    Right knee, Dr Aline Brochure  . Left forearm    . Breast surgery      Bilateral 2000  . Tonsillectomy    . R hand surgery    . Bronchoscopy  02/15/2000  . Right vats.  "  . Right upper lobe wedge resection.  "  . Upper gastrointestinal endoscopy  11/25/1999    Esophagitis/ Normal proximal esophagus, stomach and duodenum  . Colonoscopy  01/17/2009    Dr. Deatra Ina : Internal hemorrhoids/Diverticula, scattered in the ascending colon/  Moderate diverticulosis ascending colon to sigmoid colon  . Esophagogastroduodenoscopy  04/23/2003    Dr. Deatra Ina: HIATAL HERNIA  . Colonoscopy  01/16/2001    Normal  . Cataract extraction, bilateral    . Esophagogastroduodenoscopy  03/20/2012    BLT:[id number] ring-LIKELY CAUSING MILD DYSPHAGIA/SMALL hiatal hernia/Multiple sessile polyps ranging between 3-32mm , path benign  . Cataract extraction    . Carpal tunnel release      Left wrist  . Transthoracic echocardiogram  06/24/2009    EF=>55%, mild assymetric LVH; LA mildly dilated; mild mitral annular calcif, borderline MVP, mild-mod MR; mild-mod TR, RV systolic pressure elevated, mild pulm HTN; AV mildly sclerotic; mild pulm valve regurg - ordered r/t bradycardia   . Endovenous ablation saphenous vein w/ laser  01/2011    Right GSV  . Cholecystectomy      Family History  Problem Relation Age of Onset  . Diabetes Mother   . Heart disease Mother   . Stroke Mother   . Cancer Sister     KIDNEY  . Emphysema Sister   . Heart disease Brother   . Heart disease Sister   . Emphysema Sister   . Cancer Sister     BREAST  . Heart disease Brother   . Colon cancer Sister   . Alzheimer's disease Father   . Heart disease Sister   . Tremor Sister   . Tremor Brother   . Alcoholism Child   . Cancer Brother   . Pancreatic cancer Brother   . Diabetes Son    Social History:  reports that she has never smoked. She has never used smokeless tobacco. She reports that she does not drink alcohol or  use illicit drugs.  Allergies:  Allergies  Allergen Reactions  . Codeine Hives  . Morphine And Related Itching and Other (See Comments)    Makes pt hallucinate  . Actonel [Risedronate Sodium] Other (See Comments)    Abdominal pain  . Fosamax [Alendronate Sodium] Other (See Comments)    Abdominal pian  . Losartan Other (See Comments)    Hospitalized in 06/2013 with pancreatitis, losartan is associated with increased risk of pancreatitis ,  Hence discontinued  . Nitrofurantoin Other (See Comments)    Hospitalized with pancreatitis in 06/2013. Nitrofurantoin implicated as a possible cause    Medications:                                                                                                                           Current Facility-Administered Medications  Medication Dose Route Frequency Provider Last Rate Last Dose  . 0.9 %  sodium chloride infusion   Intravenous Continuous Barton Dubois, MD      . aspirin suppository 300 mg  300 mg Rectal Daily Barton Dubois, MD   300 mg at 03/22/14 1252  . [START ON 03/23/2014] ceFAZolin (ANCEF) IVPB 2 g/50 mL premix  2 g Intravenous On Call to OR Carole Civil, MD      . fentaNYL (SUBLIMAZE) injection 50 mcg  50 mcg Intravenous Q6H  PRN Barton Dubois, MD      . metoprolol (LOPRESSOR) injection 2.5 mg  2.5 mg Intravenous 4 times per day Barton Dubois, MD   2.5 mg at 03/22/14 1256  . ondansetron (ZOFRAN) injection 4 mg  4 mg Intravenous Q8H PRN Phillips Grout, MD   4 mg at 03/21/14 0330     ROS:                                                                                                                                       History obtained from unobtainable from patient due to drowsiness   Neurologic Examination:                                                                                                      Blood pressure 126/72, pulse 81, temperature 97.6 F (36.4 C), temperature source Oral, resp. rate 18, height 5\' 6"   (1.676 m), weight 71.714 kg (158 lb 1.6 oz), SpO2 99.00%.   General: NAD Mental Status: Alert, oriented to her name, month and year.  She is not oriented to hospital.  Speech shows dysarthria without evidence of aphasia.  Able to follow simple commands without difficulty. Cranial Nerves: II: Discs flat bilaterally; Visual fields difficult to assess but may have a left hemianopsia, pupils equal, round, reactive to light and accommodation III,IV, VI: ptosis not present,right gaze preference V,VII: smile asymmetric with left facial droop, facial light touch sensation normal bilaterally VIII: hearing normal bilaterally IX,X: gag reflex present XI: bilateral shoulder shrug XII: midline tongue extension without atrophy or fasciculations  Motor: Right : Upper extremity   5/5    Left:     Upper extremity   2/5  Lower extremity   5/5     Lower extremity   Cannot assess due to left hip fracture Tone and bulk:normal tone throughout; no atrophy noted Sensory: Pinprick and light touch intact throughout, bilaterally Deep Tendon Reflexes:  Right: Upper Extremity   Left: Upper extremity   biceps (C-5 to C-6) 2/4   biceps (C-5 to C-6) 2/4 tricep (C7) 2/4    triceps (C7) 2/4 Brachioradialis (C6) 2/4  Brachioradialis (C6) 2/4  Lower Extremity Lower Extremity  quadriceps (L-2 to L-4) 2/4   quadriceps (L-2 to L-4) 2/4 Achilles (S1) 0/4   Achilles (S1) 0/4  Plantars: Up going bilaterally Cerebellar: Not able to assess Gait: did not test CV: pulses palpable throughout   Lab Results: Basic Metabolic Panel:  Recent Labs Lab 03/20/14  1721 03/21/14 0516  NA 143 141  K 4.5 4.0  CL 106 106  CO2 26 24  GLUCOSE 128* 139*  BUN 20 17  CREATININE 0.98 0.65  CALCIUM 8.9 8.4    Liver Function Tests: No results found for this basename: AST, ALT, ALKPHOS, BILITOT, PROT, ALBUMIN,  in the last 168 hours No results found for this basename: LIPASE, AMYLASE,  in the last 168 hours No results found for  this basename: AMMONIA,  in the last 168 hours  CBC:  Recent Labs Lab 03/20/14 1721 03/21/14 0516  WBC 10.0 13.3*  NEUTROABS 6.9  --   HGB 12.7 11.9*  HCT 38.2 35.7*  MCV 98.7 98.1  PLT 193 181    Cardiac Enzymes: No results found for this basename: CKTOTAL, CKMB, CKMBINDEX, TROPONINI,  in the last 168 hours  Lipid Panel: No results found for this basename: CHOL, TRIG, HDL, CHOLHDL, VLDL, LDLCALC,  in the last 168 hours  CBG:  Recent Labs Lab 03/22/14 1443  [place] 124*    Microbiology: Results for orders placed during the hospital encounter of 03/20/14  SURGICAL PCR SCREEN     Status: None   Collection Time    03/20/14 10:15 PM      Result Value Ref Range Status   MRSA, PCR NEGATIVE  NEGATIVE Final   Staphylococcus aureus NEGATIVE  NEGATIVE Final   Comment:            The Xpert SA Assay (FDA     approved for NASAL specimens     in patients over 47 years of age),     is one component of     a comprehensive surveillance     program.  Test performance has     been validated by [employer] for patients greater     than or equal to 53 year old.     It is not intended     to diagnose infection nor to     guide or monitor treatment.  URINE CULTURE     Status: None   Collection Time    03/20/14 10:15 PM      Result Value Ref Range Status   Specimen Description URINE, CLEAN CATCH   Final   Special Requests NONE   Final   Culture  Setup Time     Final   Value: 03/21/2014 13:38     Performed at [facility]     Final   Value: NO GROWTH     Performed at [facility]   Culture     Final   Value: NO GROWTH     Performed at [facility]   Report Status 03/22/2014 FINAL   Final    Coagulation Studies:  Recent Labs  03/20/14 1721 03/20/14 2057  LABPROT 14.4 14.7  INR 1.12 1.15    Imaging: Dg Chest 1 View  03/20/2014   CLINICAL DATA:  [place].  Left hip pain.  History of hypertension.  EXAM: CHEST - 1 VIEW   COMPARISON:  06/05/2013  FINDINGS: The heart is mildly enlarged but stable. There is tortuosity and calcification of the thoracic aorta. The lungs demonstrate chronic emphysematous changes and pulmonary scarring. No definite acute overlying pulmonary process. No pleural effusion or pneumothorax. The bony thorax is intact.  IMPRESSION: Chronic lung changes but no acute pulmonary findings.   Electronically Signed   By: Kalman Jewels M.D.   On: 03/20/2014  18:41   Dg Hip Complete Left  03/20/2014   CLINICAL DATA:  [place].  Left hip pain.  EXAM: LEFT HIP - COMPLETE 2+ VIEW  COMPARISON:  None.  FINDINGS: There is a displaced intertrochanteric fracture of the left hip with a varus deformity. The right hip is intact. The pubic symphysis and SI joints are intact. The pubic rami are intact.  IMPRESSION: Displaced intertrochanteric fracture of the left hip.   Electronically Signed   By: Kalman Jewels M.D.   On: 03/20/2014 18:26   Ct Head Wo Contrast  03/22/2014   CLINICAL DATA:  Difficulty swallowing and left facial drooping  EXAM: CT HEAD WITHOUT CONTRAST  TECHNIQUE: Contiguous axial images were obtained from the base of the skull through the vertex without intravenous contrast.  COMPARISON:  11/18/2011  FINDINGS: Bony calvarium is intact. No gross soft tissue abnormality is noted. There are patchy areas of decreased attenuation identified within the right parietal lobe and deep white matter adjacent to the right lateral ventricle consistent with acute ischemia. These were not seen on prior MRI examination. No findings to suggest acute hemorrhage or space-occupying mass lesion are noted.  IMPRESSION: There is a decreased attenuation in the right parietal lobe/ distribution of the right middle cerebral artery consistent with acute to subacute ischemia.  These results were called by telephone at the time of interpretation on 03/22/2014 at 12:03 pm to Dr. Barton Dubois , who verbally acknowledged these results.    Electronically Signed   By: Inez Catalina M.D.   On: 03/22/2014 12:03       Assessment and plan discussed with with attending physician and they are in agreement.    Etta Quill PA-C [facility] [phone]  03/22/2014, 3:13 PM   Assessment: 80 y.o. female presenting to hospital after fall and found to have a large right MCA infarct on CT.  She was not a tPA candidate due to infarct >1/3 MCA territory.  Will obntain CT perfusion to evaluate for mismatch and potential salvageable tissue. If so will go to IR for endovascular treatment.  If no salvageable tissue found will be placed on telemetry floor and further stroke work up obtained.   Stroke Risk Factors - atrial fibrillation, hyperlipidemia and hypertension  Addendum: CTP brain: There is decreased perfusion of a much larger area of the right temporal parietal lobe in the right MCA territory compatible with tissue at risk of further infarction. CTA reveals high-grade stenosis versus occlusion distal right M1  segment with hypoperfusion of right MCA branches. Patient last known well is not entirely clear and after extensive discussion with interventional neuroradiologist it was deemed to risky to pursue endovascular intervention due to concerns of extending current stroke. Patient to be transferred to the NICU. Will follow up closely tonight and stroke team will resume care in am.  Dorian Pod, MD

## 2014-03-22 NOTE — Progress Notes (Signed)
Will give 10:00 meds per MD.   Will continue to monitor patient.

## 2014-03-22 NOTE — Progress Notes (Signed)
Pt. Was unable to swallow medications.  Notified another nurse to assess.  Pt. Vital signs stable.  Upon assessment MD was immediately notified and came to room.  Code Stroke was initiated. Rapid Response team responded.  Pt. Was taken for STAT CT and transferred to ICU.  Report given to ICU RN Judson Roch.

## 2014-03-22 NOTE — Progress Notes (Signed)
[facility] PROGRESS NOTE  Erin [name] FUX:[id number] DOB: December 22, 1929 DOA: 03/20/2014 PCP: Tula Nakayama, MD  Assessment/Plan: 1-left hip fracture: treatment per orthopedics service; initial plan was for ORIF on 9/11; but canceled due to acute stroke. -patient is clear from cardiology stand point -will need orthopedic evaluation at [facility] and treatment to stabilize fracture.  2-chronic atrial fibrillation: rate controlled. -will continue IV lopressor for nowl not candidate for anticoagulation due to frequent falls -continue telemetry  3-essential HTN: fairly well controlled. Especially with setting of pain. -continue metoprolol and norvasc, when clear to take Po's -will use IV lopressor -control pain  4-HLD: continue statins when able to tolerate Po's  5-hx of migraines: continue topamax, when able to take Po's  6-acute right MCA stroke: acute CT done and demonstrated right MCA patchy opacity; discussed with Dr. Armida Sans and area is to big for safe TPA to be attempted. Will transfer to [facility] for CT perfusion and potential endovascular intervention.  Code Status: DNR Family Communication: no family at bedside Disposition Plan: hip surgery cancel; transfer to [facility] for CT perfusion and potential endovascular intervention    Consultants:  Cardiology  Orthopedic service (Dr. Aline Brochure)  Neurology  Procedures: Anticipated hip surgery on 9/11  Antibiotics:  None   HPI/Subjective: Patient with new onset left facial droop and left side weakness.  Objective: Filed Vitals:   03/22/14 1115  BP: 126/72  Pulse: 81  Temp:   Resp:     Intake/Output Summary (Last 24 hours) at 03/22/14 1158 Last data filed at 03/22/14 0600  Gross per 24 hour  Intake      0 ml  Output    900 ml  Net   -900 ml   Filed Weights   03/20/14 1714 03/20/14 2107  Weight: 68.493 kg (151 lb) 71.714 kg (158 lb 1.6 oz)    Exam:   General:  Somnolent and with difficulty taking pills; also  with new acute onset of left facial droop and left side weakness.  Cardiovascular: irregular, rate controlled, no rubs or gallops  Respiratory: good air movement, no wheezing or crackles  Abdomen: soft, NT, ND, positive BS  Musculoskeletal: decrease range of motion on her left leg and left arm (unable to elevate it against gravity); no cyanosis   Neuro: left facial droop, left side weakness, no nystagmus; right side neurologic/motor exam intact  Data Reviewed: Basic Metabolic Panel:  Recent Labs Lab 03/20/14 1721 03/21/14 0516  NA 143 141  K 4.5 4.0  CL 106 106  CO2 26 24  GLUCOSE 128* 139*  BUN 20 17  CREATININE 0.98 0.65  CALCIUM 8.9 8.4   CBC:  Recent Labs Lab 03/20/14 1721 03/21/14 0516  WBC 10.0 13.3*  NEUTROABS 6.9  --   HGB 12.7 11.9*  HCT 38.2 35.7*  MCV 98.7 98.1  PLT 193 181    Recent Results (from the past 240 hour(s))  SURGICAL PCR SCREEN     Status: None   Collection Time    03/20/14 10:15 PM      Result Value Ref Range Status   MRSA, PCR NEGATIVE  NEGATIVE Final   Staphylococcus aureus NEGATIVE  NEGATIVE Final   Comment:            The Xpert SA Assay (FDA     approved for NASAL specimens     in patients over 21 years of age),     is one component of     a comprehensive surveillance  program.  Test performance has     been validated by [facility] for patients greater     than or equal to 13 year old.     It is not intended     to diagnose infection nor to     guide or monitor treatment.     Studies: Dg Chest 1 View  03/20/2014   CLINICAL DATA:  [place].  Left hip pain.  History of hypertension.  EXAM: CHEST - 1 VIEW  COMPARISON:  06/05/2013  FINDINGS: The heart is mildly enlarged but stable. There is tortuosity and calcification of the thoracic aorta. The lungs demonstrate chronic emphysematous changes and pulmonary scarring. No definite acute overlying pulmonary process. No pleural effusion or pneumothorax. The bony thorax is intact.   IMPRESSION: Chronic lung changes but no acute pulmonary findings.   Electronically Signed   By: Kalman Jewels M.D.   On: 03/20/2014 18:41   Dg Hip Complete Left  03/20/2014   CLINICAL DATA:  [place].  Left hip pain.  EXAM: LEFT HIP - COMPLETE 2+ VIEW  COMPARISON:  None.  FINDINGS: There is a displaced intertrochanteric fracture of the left hip with a varus deformity. The right hip is intact. The pubic symphysis and SI joints are intact. The pubic rami are intact.  IMPRESSION: Displaced intertrochanteric fracture of the left hip.   Electronically Signed   By: Kalman Jewels M.D.   On: 03/20/2014 18:26    Scheduled Meds: . amLODipine  5 mg Oral Daily  . chlorhexidine  60 mL Topical Once  . cholecalciferol  400 Units Oral Daily  . gabapentin  100 mg Oral QHS  . magnesium oxide  400 mg Oral Daily  . metoprolol succinate  50 mg Oral Daily  . potassium chloride SA  20 mEq Oral Daily  . topiramate  25 mg Oral Daily   And  . topiramate  50 mg Oral QHS   Continuous Infusions:   Principal Problem:   Fracture, intertrochanteric, left femur Active Problems:   Essential hypertension, benign   DNR (do not resuscitate)   Atrial fibrillation   Fall at home   Preoperative cardiovascular examination    Time spent: >30 minutes    Barton Dubois  [facility] Pager [phone]. If 7PM-7AM, please contact night-coverage at [email or web address], password [facility] 03/22/2014, 11:58 AM  LOS: 2 days

## 2014-03-22 NOTE — Progress Notes (Signed)
    Consulting cardiologist: Rozann Lesches MD Primary Cardiologist: Lyman Bishop MD  Subjective:    Difficult to arouse. No verbal complaints.   Objective:   Temp:  [97.6 F (36.4 C)-98.4 F (36.9 C)] 97.6 F (36.4 C) (09/11 0600) Pulse Rate:  [72-77] 77 (09/11 0600) Resp:  [16-18] 16 (09/11 0600) BP: (125-127)/(50-69) 125/56 mmHg (09/11 0600) SpO2:  [97 %-100 %] 100 % (09/11 0600)    Filed Weights   03/20/14 1714 03/20/14 2107  Weight: 151 lb (68.493 kg) 158 lb 1.6 oz (71.714 kg)    Intake/Output Summary (Last 24 hours) at 03/22/14 0801 Last data filed at 03/22/14 0600  Gross per 24 hour  Intake      0 ml  Output    900 ml  Net   -900 ml    Telemetry: Atrial fib with 60's to 80's.   Exam:  General: Sedated, sleepy  Lungs: Clear to auscultation, nonlabored, anterior assessment. Some crackles are noted in the right base. Not taking deep breaths.   Cardiac: No elevated JVP or bruits. IRRR, no gallop or rub.   Abdomen: Normoactive bowel sounds, nontender, nondistended.  Extremities: No pitting edema, distal pulses full. Left leg externally rotated.    Lab Results:  Basic Metabolic Panel:  Recent Labs Lab 03/20/14 1721 03/21/14 0516  NA 143 141  K 4.5 4.0  CL 106 106  CO2 26 24  GLUCOSE 128* 139*  BUN 20 17  CREATININE 0.98 0.65  CALCIUM 8.9 8.4    CBC:  Recent Labs Lab 03/20/14 1721 03/21/14 0516  WBC 10.0 13.3*  HGB 12.7 11.9*  HCT 38.2 35.7*  MCV 98.7 98.1  PLT 193 181    Coagulation:  Recent Labs Lab 03/20/14 1721 03/20/14 2057  INR 1.12 1.15     Medications:   Scheduled Medications: . amLODipine  5 mg Oral Daily  . chlorhexidine  60 mL Topical Once  . cholecalciferol  400 Units Oral Daily  . gabapentin  100 mg Oral QHS  . magnesium oxide  400 mg Oral Daily  . metoprolol succinate  50 mg Oral Daily  . potassium chloride SA  20 mEq Oral Daily  . topiramate  25 mg Oral Daily   And  . topiramate  50 mg Oral QHS     PRN Medications: fentaNYL, methocarbamol (ROBAXIN) IV, methocarbamol, ondansetron (ZOFRAN) IV, oxyCODONE   Assessment and Plan:   1. Atrial fibrillation: Chronic, with CHADS-VASC score of 4, but not anticoagulation candidate due to frequent falls (followed by Dr. Debara Pickett). Rate is well controlled on metoprolol. Stable currently.  2. S/P Left intertrochanteric fracture of the left hip: She has an acceptable perioperative cardiac risk to proceed with hip repair, timing per orthopedics.   3. Hypertension: Currently well controlled. Continue BB and amlodipine.   Phill Myron. Lawrence NP  03/22/2014, 8:01 AM   Attending note:  Patient seen and examined. Modified above note by Ms. Lawrence NP. From a cardiac perspective, patient stable to proceed with left hip surgery. Atrial fibrillation heart rate is well controlled on beta blocker.  Satira Sark, M.D., F.A.C.C.

## 2014-03-22 NOTE — Care Management Note (Signed)
    Page 1 of 1   03/22/2014     3:12:27 PM CARE MANAGEMENT NOTE 03/22/2014  Patient:  LOMETA, RIGGIN   Account Number:  [id number]  Date Initiated:  03/22/2014  Documentation initiated by:  Elissa Hefty  Subjective/Objective Assessment:   transf from [place] on 9-11     Action/Plan:   lives at home w fam , pcp dr Joycelyn Schmid simpson   Anticipated DC Date:     Anticipated DC Plan:  SKILLED NURSING FACILITY  In-house referral  Clinical Social Worker         Choice offered to / List presented to:             Status of service:   Medicare Important Message given?   (If response is "NO", the following Medicare IM given date fields will be blank) Date Medicare IM given:   Medicare IM given by:   Date Additional Medicare IM given:   Additional Medicare IM given by:    Discharge Disposition:    Per UR Regulation:  Reviewed for med. necessity/level of care/duration of stay  If discussed at [place] of Stay Meetings, dates discussed:    Comments:

## 2014-03-22 NOTE — Progress Notes (Signed)
Responded to rapid response. Upon arrival, MD was in room. Pt has chronic A-fib and was not a candidate for anticoagulation (per MD). Pt had slurred speech, left facial droop, and inability to move the left arm. Last known well time was 1030. MD placed orders for neuro consult and head CT.

## 2014-03-22 NOTE — Progress Notes (Signed)
Pt transferring to [facility] for possible surgical intervention for ischemic stroke. Originally, pt was to be transferred to 3W; but, [facility]ist at [facility] asked that pt be transferred to a neuro ICU. Per carelink there are no neuro ICU beds available at this time and that pt will be going to unit 73M. Attempted to call report to 73M, but they had not yet been paged for bed per charge nurse. They will call back when bed assignment has been made. Pt's assessment is unchanged from arrival time to our unit. All belongings transferred with pt at 1405 with carelink.

## 2014-03-22 NOTE — Clinical Social Work Note (Signed)
CSW presented bed offers and pt said if she needs to go, she would choose [facility]. Facility notified. Pt scheduled for surgery today.  Benay Pike, [place]

## 2014-03-22 NOTE — Clinical Social Work Note (Signed)
Pt's insurance is now showing [employer] primary. CSW notified [facility] and they will start authorization process.  Benay Pike, [place]

## 2014-03-22 NOTE — Progress Notes (Addendum)
Pt transferred from 300 to ICU bed 5. Pt is arousable and oriented, VS are stable. Pt had a questionable stroke this AM. CT scan results still pending. Teleneuro evaluation has been called in. Will continue to monitor.

## 2014-03-22 NOTE — Clinical Social Work Placement (Addendum)
Clinical Social Work Department CLINICAL SOCIAL WORK PLACEMENT NOTE 03/22/2014  Patient:  Erin [name], Erin [name]  Account Number:  [id number] Admit date:  03/20/2014  Clinical Social Worker:  Benay Pike, LCSW  Date/time:  03/21/2014 11:55 AM  Clinical Social Work is seeking post-discharge placement for this patient at the following level of care:   [place]   (*CSW will update this form in Epic as items are completed)   03/21/2014  Patient/family provided with [place] Department of Clinical Social Work's list of facilities offering this level of care within the geographic area requested by the patient (or if unable, by the patient's family).  03/21/2014  Patient/family informed of their freedom to choose among providers that offer the needed level of care, that participate in Medicare, Medicaid or managed care program needed by the patient, have an available bed and are willing to accept the patient.  03/21/2014  Patient/family informed of [facility]' ownership interest in [facility], as well as of the fact that they are under no obligation to receive care at this facility.  PASARR submitted to EDS on 03/21/2014 PASARR number received on 03/21/2014  FL2 transmitted to all facilities in geographic area requested by pt/family on  03/21/2014 FL2 transmitted to all facilities within larger geographic area on   Patient informed that his/her managed care company has contracts with or will negotiate with  certain facilities, including the following:     Patient/family informed of bed offers received:  03/22/2014 Patient chooses bed at [facility] Physician recommends and patient chooses bed at  [facility]  Patient to be transferred to  on   Patient to be transferred to facility by  Patient and family notified of transfer on  Name of family member notified:    The following physician request were entered in Epic:   Additional Comments:  Benay Pike, [place]

## 2014-03-23 ENCOUNTER — Inpatient Hospital Stay (HOSPITAL_COMMUNITY): Payer: Medicare Other

## 2014-03-23 DIAGNOSIS — S72143A Displaced intertrochanteric fracture of unspecified femur, initial encounter for closed fracture: Principal | ICD-10-CM

## 2014-03-23 DIAGNOSIS — I6350 Cerebral infarction due to unspecified occlusion or stenosis of unspecified cerebral artery: Secondary | ICD-10-CM

## 2014-03-23 DIAGNOSIS — I6340 Cerebral infarction due to embolism of unspecified cerebral artery: Secondary | ICD-10-CM | POA: Diagnosis not present

## 2014-03-23 LAB — LIPID PANEL
Cholesterol: 208 mg/dL — ABNORMAL HIGH (ref 0–200)
HDL: 56 mg/dL (ref >39)
LDL CALC: 134 mg/dL — AB (ref 0–99)
TRIGLYCERIDES: 91 mg/dL (ref <150)
Total CHOL/HDL Ratio: 3.7 RATIO
VLDL: 18 mg/dL (ref 0–40)

## 2014-03-23 LAB — HEMOGLOBIN A1C
HEMOGLOBIN A1C: 6.1 % — AB (ref <5.7)
Mean Plasma Glucose: 128 mg/dL — ABNORMAL HIGH (ref <117)

## 2014-03-23 LAB — GLUCOSE, CAPILLARY
GLUCOSE-CAPILLARY: 136 mg/dL — AB (ref 70–99)
Glucose-Capillary: 116 mg/dL — ABNORMAL HIGH (ref 70–99)

## 2014-03-23 MED ORDER — HEPARIN SODIUM (PORCINE) 5000 UNIT/ML IJ SOLN
5000.0000 [IU] | Freq: Two times a day (BID) | INTRAMUSCULAR | Status: DC
  Administered 2014-03-23 – 2014-03-24 (×4): 5000 [IU] via SUBCUTANEOUS
  Filled 2014-03-23 (×3): qty 1

## 2014-03-23 MED ORDER — RESOURCE THICKENUP CLEAR PO POWD
ORAL | Status: DC | PRN
  Filled 2014-03-23: qty 125

## 2014-03-23 MED ORDER — METOPROLOL TARTRATE 25 MG PO TABS: 25.0000 mg | ORAL_TABLET | Freq: Two times a day (BID) | ORAL | Status: DC

## 2014-03-23 MED ORDER — METOPROLOL TARTRATE 12.5 MG HALF TABLET
12.5000 mg | ORAL_TABLET | Freq: Two times a day (BID) | ORAL | Status: DC
  Administered 2014-03-23 – 2014-03-25 (×5): 12.5 mg via ORAL
  Filled 2014-03-23 (×9): qty 1

## 2014-03-23 MED ORDER — ATORVASTATIN CALCIUM 40 MG PO TABS
40.0000 mg | ORAL_TABLET | Freq: Every day | ORAL | Status: DC
  Administered 2014-03-23 – 2014-03-27 (×5): 40 mg via ORAL
  Filled 2014-03-23 (×7): qty 1

## 2014-03-23 MED ORDER — METOPROLOL TARTRATE 12.5 MG HALF TABLET: 12.5000 mg | ORAL_TABLET | Freq: Two times a day (BID) | ORAL | Status: DC

## 2014-03-23 MED ORDER — SODIUM CHLORIDE 0.9 % IV SOLN
INTRAVENOUS | Status: DC
  Administered 2014-03-23: 16:00:00 via INTRAVENOUS

## 2014-03-23 MED ORDER — ASPIRIN 325 MG PO TABS
325.0000 mg | ORAL_TABLET | Freq: Every day | ORAL | Status: DC
  Administered 2014-03-24: 325 mg via ORAL
  Filled 2014-03-23 (×2): qty 1

## 2014-03-23 MED ORDER — STARCH (THICKENING) PO POWD
ORAL | Status: DC | PRN
  Filled 2014-03-23: qty 227

## 2014-03-23 NOTE — Progress Notes (Signed)
STROKE TEAM PROGRESS NOTE   HISTORY Erin [name] is an 80 y.o. female who lives at home and had a fall resulting in left hip fracture on 9/9. [facility]. She was noted to be confused at that time but today staff noted she had a left sided deficit. Code stroke was not called due to CT findings showing > 1/3 right MCA infarct. Patient was transferred to [facility] hospital for further evaluation and possible endovascular intervention. On arrival patient was alert, able to stated name and follow minimal commands. She showed a left sided paresis and right gaze preference. IR was called and they recommended CT perfusion to evaluate for mismatch.  Date last known well: Date: 03/21/2014  Time last known well: Unable to determine there was a change noted at 10:30 this AM but it was also noted she was more confused yesterday.  tPA Given: No: out of the window  She was admitted to the neuro ICU for further evaluation and treatment.  SUBJECTIVE (INTERVAL HISTORY) Son and two more family members are at the bedside.  Overall she feels her condition is unchanged. She was lethargic but able to open eyes for questions and followed commands. Left UE not able to move and LLE painful on movement.  OBJECTIVE Temp:  [97.7 F (36.5 C)-99.4 F (37.4 C)] 98.5 F (36.9 C) (09/12 1159) Pulse Rate:  [51-107] 81 (09/12 1300) Cardiac Rhythm:  [-] Atrial fibrillation (09/12 1151) Resp:  [13-37] 25 (09/12 1300) BP: (94-134)/(43-68) 94/58 mmHg (09/12 1300) SpO2:  [94 %-100 %] 97 % (09/12 1300)   Recent Labs Lab 03/22/14 1443 03/22/14 1711 03/22/14 2015 03/23/14 0010 03/23/14 0413  GLUCAP 124* 98 107* 116* 136*    Recent Labs Lab 03/20/14 1721 03/21/14 0516  NA 143 141  K 4.5 4.0  CL 106 106  CO2 26 24  GLUCOSE 128* 139*  BUN 20 17  CREATININE 0.98 0.65  CALCIUM 8.9 8.4   No results found for this basename: AST, ALT, ALKPHOS, BILITOT, PROT, ALBUMIN,  in the last 168 hours  Recent  Labs Lab 03/20/14 1721 03/21/14 0516  WBC 10.0 13.3*  NEUTROABS 6.9  --   HGB 12.7 11.9*  HCT 38.2 35.7*  MCV 98.7 98.1  PLT 193 181   No results found for this basename: CKTOTAL, CKMB, CKMBINDEX, TROPONINI,  in the last 168 hours  Recent Labs  03/20/14 1721 03/20/14 2057  LABPROT 14.4 14.7  INR 1.12 1.15    Recent Labs  03/20/14 2215  COLORURINE YELLOW  LABSPEC 1.015  PHURINE 7.5  GLUCOSEU NEGATIVE  HGBUR NEGATIVE  BILIRUBINUR NEGATIVE  KETONESUR NEGATIVE  PROTEINUR NEGATIVE  UROBILINOGEN 0.2  NITRITE NEGATIVE  LEUKOCYTESUR TRACE*       Component Value Date/Time   CHOL 208* 03/23/2014 0425   TRIG 91 03/23/2014 0425   HDL 56 03/23/2014 0425   CHOLHDL 3.7 03/23/2014 0425   VLDL 18 03/23/2014 0425   LDLCALC 134* 03/23/2014 0425   Lab Results  Component Value Date   HGBA1C 6.1* 03/22/2014   No results found for this basename: labopia, cocainscrnur, labbenz, amphetmu, thcu, labbarb    No results found for this basename: ETH,  in the last 168 hours  Ct Angio Head and neck W/cm &/or Wo Cm  03/22/2014   IMPRESSION: Acute infarct in the right basal ganglia and right parietal white matter. There is decreased perfusion of a much larger area of the right temporal parietal lobe in the right MCA  territory compatible with tissue at risk of further infarction.  CTA reveals high-grade stenosis versus occlusion distal right M1 segment with hypoperfusion of right MCA branches.  Carotid artery and vertebral artery widely patent in the neck bilaterally.   Ct Head Wo Contrast  03/22/2014   IMPRESSION: There is a decreased attenuation in the right parietal lobe/ distribution of the right middle cerebral artery consistent with acute to subacute ischemia.   Venous doppler - Lower extremity venous duplex has been completed. Preliminary findings: no evidence of DVT  MRI brain - pending  2D echo - pending  PHYSICAL EXAM Physical exam  Temp:  [97.7 F (36.5 C)-99.4 F (37.4 C)]  98.5 F (36.9 C) (09/12 1159) Pulse Rate:  [51-107] 81 (09/12 1300) Resp:  [13-37] 25 (09/12 1300) BP: (94-134)/(43-68) 94/58 mmHg (09/12 1300) SpO2:  [94 %-100 %] 97 % (09/12 1300)  General - Well nourished, well developed, lethargic.  Ophthalmologic - not cooperative on exam.  Cardiovascular - irregularly irregular HR and rhythm.  Mental Status -  Lethargic, but open eyes on voice, naming and repeating intact, follows simple commands and language fluent.  Cranial Nerves II - XII - II - not blink to threat on the right visual field. III, IV, VI - right eye gaze preference. V - Facial sensation intact bilaterally. VII - left facial droop. VIII - Hearing & vestibular intact bilaterally. X - Palate elevates symmetrically. XI - Chin turning & shoulder shrug intact bilaterally. XII - Tongue protrusion intact.  Motor Strength - LUE 0/5, LLE not able to move proximally due to pain but able to wiggle toes, RUE 5-/5 and RLE 4+/5.  Reflexes - The patient's reflexes were 1+ in all extremities and she had no pathological reflexes.  Sensory - Light touch, temperature/pinprick were assessed and were symmetric.    Coordination - lethargic and not able to test.  Tremor was absent.  Gait and Station - not able to test.   ASSESSMENT/PLAN  Ms. Erin [name] is a 80 y.o. female with history of afib not on AC due to frequent falls, HTN, HLD, migraine was [facility] to [facility] for left hip fracture s/p fall, prepared for surgery but developed AMS and left sided weakness yesterday. Imaging consistent with right MCA stroke likely due to afib not on AC.   Stroke - right MCA territory, embolic secondary to afib not on [facility]     aspirin 81 mg orally every day prior to admission, now on aspirin 325 mg orally every day  MRI  pending  CTA head and neck showed right MCA stroke but no LVO  2D Echo  pending  LDL 134, added lipitor for stroke prevention and HLD  HgbA1c 6.1 WNL  SCDs and heparin subq  for VTE prophylaxis  Dysphagia honey thick liquids.   Bedrest for left hip fracture  Afib - chronic, not on AC due to frequent fall - currently rate in control - IM on board - metoprolol for rate control - will hold off AC for now due to potential surgery and recent large stroke - ASA 325mg  for now.  Hypertension   Home meds:  norvasc and metoprolol. metoprolo was resumed in hospital with holding parameters.  BP tends to be low, will continue the fluids  Permissive hypertension <220/120 for 24-48 hours  Hyperlipidemia  Home meds: lovastatin 20mg   LDL 134   Started lipitor 40mg    LDL goal < 100   Other Stroke Risk Factors Advanced age   Obesity, Body mass  index is 25.53 kg/(m^2).   Other Active Problems  Left hip fracture - ortho on board  Surgery when stable  Prefer spinal anesthesia  Avoid hypotension during surgery  Other Pertinent History  Migraine on topamax at home  Hospital day # 3  This patient is critically ill due to large right MCA stroke and afib not on anticoagulation and at significant risk of neurological worsening, death form recurrent stroke, brain herniation and cerebral edema. She also has left hip fracture not candidate for surgery now, which also increase her risk of mortality. This patient's care requires constant monitoring of vital signs, hemodynamics, respiratory and cardiac monitoring, review of multiple databases, neurological assessment, discussion with son and other family memebers, other specialists and medical decision making of high complexity. I spent 50 minutes of neurocritical care time in the care of this patient.   Rosalin Hawking, MD PhD Stroke Neurology 03/23/2014 2:40 PM   To contact Stroke Continuity provider, please refer to [email or web address] After hours, contact General Neurology

## 2014-03-23 NOTE — Progress Notes (Signed)
*  PRELIMINARY RESULTS* Vascular Ultrasound Lower extremity venous duplex  has been completed.  Preliminary findings: no evidence of DVT  Landry Mellow, RDMS, RVT  03/23/2014, 11:55 AM

## 2014-03-23 NOTE — Consult Note (Signed)
ORTHOPAEDIC CONSULTATION  REQUESTING PHYSICIAN: Rosalin Hawking, MD  Chief Complaint: left hip fx  HPI: Erin [name] is a 80 y.o. female who complains of left hip fx from mechanical fall at home on Wed.  Surgery was planned for yesterday pm at [facility] but patient had a CVA with left hemiplegia.  She was transferred here for the CVA.  Ortho consulted for left IT hip fx.  Denies LOC, neck pain, abd pain.  Patient not candidate for endovascular intervention or tPA.  Presently not on anticoagulation.  MRI brain scheduled for later today.  Past Medical History  Diagnosis Date  . Anxiety   . Arthritis   . Hyperlipidemia   . Essential hypertension, benign   . Osteoporosis   . GERD (gastroesophageal reflux disease)   . Hearing loss   . Restless leg   . Tremor   . Leg edema   . Obese   . Gall bladder disease   . Cataract   . Carpal tunnel syndrome of left wrist   . Atrial fibrillation   . Sleep apnea   . Esophageal dilatation   . Frequent falls   . Parkinson disease   . History of nuclear stress test 08/31/2012    Lexiscan cardiolite negative for ischemia  . Polyneuropathy in other diseases classified elsewhere 10/03/2013   Past Surgical History  Procedure Laterality Date  . Appendectomy    . Cholecystectomy    . Abdominal hysterectomy    . Fracture surgery      Left arm x4  . Benign cyst removed from kidney and lung, bilateral mastectomy    . Bilateral great toenail removal    . Knee arthroscopy  2012    Right knee, Dr Aline Brochure  . Left forearm    . Breast surgery      Bilateral 2000  . Tonsillectomy    . R hand surgery    . Bronchoscopy  02/15/2000  . Right vats.  "  . Right upper lobe wedge resection.  "  . Upper gastrointestinal endoscopy  11/25/1999    Esophagitis/ Normal proximal esophagus, stomach and duodenum  . Colonoscopy  01/17/2009    Dr. Deatra Ina : Internal hemorrhoids/Diverticula, scattered in the ascending colon/  Moderate diverticulosis ascending colon  to sigmoid colon  . Esophagogastroduodenoscopy   04/23/2003    Dr. Deatra Ina: HIATAL HERNIA  . Colonoscopy  01/16/2001    Normal  . Cataract extraction, bilateral    . Esophagogastroduodenoscopy  03/20/2012    POE:UMPNTIRW ring-LIKELY CAUSING MILD DYSPHAGIA/SMALL hiatal hernia/Multiple sessile polyps ranging between 3-90m , path benign  . Cataract extraction    . Carpal tunnel release      Left wrist  . Transthoracic echocardiogram  06/24/2009    EF=>55%, mild assymetric LVH; LA mildly dilated; mild mitral annular calcif, borderline MVP, mild-mod MR; mild-mod TR, RV systolic pressure elevated, mild pulm HTN; AV mildly sclerotic; mild pulm valve regurg - ordered r/t bradycardia   . Endovenous ablation saphenous vein w/ laser  01/2011    Right GSV  . Cholecystectomy     History   Social History  . Marital Status: Widowed    Spouse Name: N/A    Number of Children: 2  . Years of Education: hs   Occupational History  . Retired   .     Social History Main Topics  . Smoking status: Never Smoker   . Smokeless tobacco: Never Used  . Alcohol Use: No  . Drug Use: No  .  Sexual Activity: No   Other Topics Concern  . None   Social History Narrative  . None   Family History  Problem Relation Age of Onset  . Diabetes Mother   . Heart disease Mother   . Stroke Mother   . Cancer Sister     KIDNEY  . Emphysema Sister   . Heart disease Brother   . Heart disease Sister   . Emphysema Sister   . Cancer Sister     BREAST  . Heart disease Brother   . Colon cancer Sister   . Alzheimer's disease Father   . Heart disease Sister   . Tremor Sister   . Tremor Brother   . Alcoholism Child   . Cancer Brother   . Pancreatic cancer Brother   . Diabetes Son    Allergies  Allergen Reactions  . Codeine Hives  . Morphine And Related Itching and Other (See Comments)    Makes pt hallucinate  . Actonel [Risedronate Sodium] Other (See Comments)    Abdominal pain  . Fosamax [Alendronate  Sodium] Other (See Comments)    Abdominal pian  . Losartan Other (See Comments)    Hospitalized in 06/2013 with pancreatitis, losartan is associated with increased risk of pancreatitis ,  Hence discontinued  . Nitrofurantoin Other (See Comments)    Hospitalized with pancreatitis in 06/2013. Nitrofurantoin implicated as a possible cause   Prior to Admission medications   Medication Sig Start Date End Date Taking? Authorizing Provider  amLODipine (NORVASC) 5 MG tablet Take 5 mg by mouth daily.   Yes Historical Provider, MD  aspirin EC 81 MG tablet Take 81 mg by mouth every other day.   Yes Historical Provider, MD  cholecalciferol (VITAMIN D-400) 400 UNITS TABS tablet Take 400 Units by mouth daily.   Yes Historical Provider, MD  gabapentin (NEURONTIN) 100 MG capsule Take 1 capsule (100 mg total) by mouth at bedtime. 02/14/14  Yes Fayrene Helper, MD  lovastatin (MEVACOR) 20 MG tablet Take 1 tablet (20 mg total) by mouth at bedtime. 02/14/14  Yes Fayrene Helper, MD  magnesium gluconate (MAGONATE) 500 MG tablet Take 500 mg by mouth 2 (two) times daily.   Yes Historical Provider, MD  metoprolol succinate (TOPROL-XL) 50 MG 24 hr tablet Take 50 mg by mouth daily. Take with or immediately following a meal.   Yes Historical Provider, MD  potassium chloride SA (K-DUR,KLOR-CON) 20 MEQ tablet Take 1 tablet by mouth daily on days when lasix is taken 09/21/13  Yes Fayrene Helper, MD  potassium chloride SA (K-DUR,KLOR-CON) 20 MEQ tablet Take 20 mEq by mouth daily.   Yes Historical Provider, MD  topiramate (TOPAMAX) 25 MG tablet Take 25-50 mg by mouth 2 (two) times daily. Patient takes 1 tablet in the morning and 2 tablets at bedtime.   Yes Historical Provider, MD  furosemide (LASIX) 20 MG tablet Take 1 tablet (20 mg total) by mouth daily as needed. 09/21/13   Fayrene Helper, MD   Ct Angio Head W/cm &/or Wo Cm  03/22/2014   CLINICAL DATA:  Stroke. Left-sided weakness and slurred speech. Hip fracture   EXAM: CT ANGIOGRAPHY HEAD AND NECK  CT PERFUSION HEAD  TECHNIQUE: Multidetector CT imaging of the head and neck was performed using the standard protocol during bolus administration of intravenous contrast. Multiplanar CT image reconstructions and MIPs were obtained to evaluate the vascular anatomy. Carotid stenosis measurements (when applicable) are obtained utilizing NASCET criteria, using the distal internal carotid  diameter as the denominator.  CONTRAST:  164m OMNIPAQUE IOHEXOL 350 MG/ML SOLN  COMPARISON:  CT head 03/22/2014  FINDINGS: CTA HEAD FINDINGS  Hypodensity in the right basal ganglia involving the putamen consistent with acute infarct. This extends into the right parietal lobe. Negative for intracranial hemorrhage or mass.  Both vertebral arteries are patent to the basilar. Atherosclerotic calcification and mild stenosis distal right vertebral artery. PICA patent bilaterally. Left AICA patent. Basilar patent. Superior cerebellar and posterior cerebral arteries patent bilaterally.  Right cavernous carotid is widely patent. High-grade stenosis versus occlusion of the distal right M1 segment. Hypoperfusion of the right MCA branches which are patent and perfused. Right anterior cerebral artery patent without stenosis  Left cavernous carotid widely patent. Left anterior and middle cerebral arteries are patent without stenosis.  Negative for cerebral aneurysm.  Review of the MIP images confirms the above findings.  CTA NECK FINDINGS  Proximal great vessels are patent without significant stenosis. Right upper lobe density most likely scarring.  Carotid artery is widely patent bilaterally without significant stenosis. Mild atherosclerotic disease involving the carotid bulb on the right.  Both vertebral arteries widely patent without stenosis.  CT profusion findings  Acute infarct involving the right putamen and deep white matter on the right. There is a much larger area of penumbra with decreased time to peak  perfusion and mean transit time.  Review of the MIP images confirms the above findings.  IMPRESSION: Acute infarct in the right basal ganglia and right parietal white matter. There is decreased perfusion of a much larger area of the right temporal parietal lobe in the right MCA territory compatible with tissue at risk of further infarction.  CTA reveals high-grade stenosis versus occlusion distal right M1 segment with hypoperfusion of right MCA branches.  Carotid artery and vertebral artery widely patent in the neck bilaterally.  I reviewed the images with Dr. DEstanislado Pandy  Electronically Signed   By: CFranchot GalloM.D.   On: 03/22/2014 18:24   Ct Head Wo Contrast  03/22/2014   CLINICAL DATA:  Difficulty swallowing and left facial drooping  EXAM: CT HEAD WITHOUT CONTRAST  TECHNIQUE: Contiguous axial images were obtained from the base of the skull through the vertex without intravenous contrast.  COMPARISON:  11/18/2011  FINDINGS: Bony calvarium is intact. No gross soft tissue abnormality is noted. There are patchy areas of decreased attenuation identified within the right parietal lobe and deep white matter adjacent to the right lateral ventricle consistent with acute ischemia. These were not seen on prior MRI examination. No findings to suggest acute hemorrhage or space-occupying mass lesion are noted.  IMPRESSION: There is a decreased attenuation in the right parietal lobe/ distribution of the right middle cerebral artery consistent with acute to subacute ischemia.  These results were called by telephone at the time of interpretation on 03/22/2014 at 12:03 pm to Dr. CBarton Dubois, who verbally acknowledged these results.   Electronically Signed   By: MInez CatalinaM.D.   On: 03/22/2014 12:03   Ct Angio Neck W/cm &/or Wo/cm  03/22/2014   CLINICAL DATA:  Stroke. Left-sided weakness and slurred speech. Hip fracture  EXAM: CT ANGIOGRAPHY HEAD AND NECK  CT PERFUSION HEAD  TECHNIQUE: Multidetector CT imaging of the  head and neck was performed using the standard protocol during bolus administration of intravenous contrast. Multiplanar CT image reconstructions and MIPs were obtained to evaluate the vascular anatomy. Carotid stenosis measurements (when applicable) are obtained utilizing NASCET criteria, using the distal  internal carotid diameter as the denominator.  CONTRAST:  128m OMNIPAQUE IOHEXOL 350 MG/ML SOLN  COMPARISON:  CT head 03/22/2014  FINDINGS: CTA HEAD FINDINGS  Hypodensity in the right basal ganglia involving the putamen consistent with acute infarct. This extends into the right parietal lobe. Negative for intracranial hemorrhage or mass.  Both vertebral arteries are patent to the basilar. Atherosclerotic calcification and mild stenosis distal right vertebral artery. PICA patent bilaterally. Left AICA patent. Basilar patent. Superior cerebellar and posterior cerebral arteries patent bilaterally.  Right cavernous carotid is widely patent. High-grade stenosis versus occlusion of the distal right M1 segment. Hypoperfusion of the right MCA branches which are patent and perfused. Right anterior cerebral artery patent without stenosis  Left cavernous carotid widely patent. Left anterior and middle cerebral arteries are patent without stenosis.  Negative for cerebral aneurysm.  Review of the MIP images confirms the above findings.  CTA NECK FINDINGS  Proximal great vessels are patent without significant stenosis. Right upper lobe density most likely scarring.  Carotid artery is widely patent bilaterally without significant stenosis. Mild atherosclerotic disease involving the carotid bulb on the right.  Both vertebral arteries widely patent without stenosis.  CT profusion findings  Acute infarct involving the right putamen and deep white matter on the right. There is a much larger area of penumbra with decreased time to peak perfusion and mean transit time.  Review of the MIP images confirms the above findings.   IMPRESSION: Acute infarct in the right basal ganglia and right parietal white matter. There is decreased perfusion of a much larger area of the right temporal parietal lobe in the right MCA territory compatible with tissue at risk of further infarction.  CTA reveals high-grade stenosis versus occlusion distal right M1 segment with hypoperfusion of right MCA branches.  Carotid artery and vertebral artery widely patent in the neck bilaterally.  I reviewed the images with Dr. DEstanislado Pandy  Electronically Signed   By: CFranchot GalloM.D.   On: 03/22/2014 18:24   Ct Cerebral Perfusion W/cm  03/22/2014   CLINICAL DATA:  Stroke. Left-sided weakness and slurred speech. Hip fracture  EXAM: CT ANGIOGRAPHY HEAD AND NECK  CT PERFUSION HEAD  TECHNIQUE: Multidetector CT imaging of the head and neck was performed using the standard protocol during bolus administration of intravenous contrast. Multiplanar CT image reconstructions and MIPs were obtained to evaluate the vascular anatomy. Carotid stenosis measurements (when applicable) are obtained utilizing NASCET criteria, using the distal internal carotid diameter as the denominator.  CONTRAST:  1030mOMNIPAQUE IOHEXOL 350 MG/ML SOLN  COMPARISON:  CT head 03/22/2014  FINDINGS: CTA HEAD FINDINGS  Hypodensity in the right basal ganglia involving the putamen consistent with acute infarct. This extends into the right parietal lobe. Negative for intracranial hemorrhage or mass.  Both vertebral arteries are patent to the basilar. Atherosclerotic calcification and mild stenosis distal right vertebral artery. PICA patent bilaterally. Left AICA patent. Basilar patent. Superior cerebellar and posterior cerebral arteries patent bilaterally.  Right cavernous carotid is widely patent. High-grade stenosis versus occlusion of the distal right M1 segment. Hypoperfusion of the right MCA branches which are patent and perfused. Right anterior cerebral artery patent without stenosis  Left cavernous  carotid widely patent. Left anterior and middle cerebral arteries are patent without stenosis.  Negative for cerebral aneurysm.  Review of the MIP images confirms the above findings.  CTA NECK FINDINGS  Proximal great vessels are patent without significant stenosis. Right upper lobe density most likely scarring.  Carotid artery  is widely patent bilaterally without significant stenosis. Mild atherosclerotic disease involving the carotid bulb on the right.  Both vertebral arteries widely patent without stenosis.  CT profusion findings  Acute infarct involving the right putamen and deep white matter on the right. There is a much larger area of penumbra with decreased time to peak perfusion and mean transit time.  Review of the MIP images confirms the above findings.  IMPRESSION: Acute infarct in the right basal ganglia and right parietal white matter. There is decreased perfusion of a much larger area of the right temporal parietal lobe in the right MCA territory compatible with tissue at risk of further infarction.  CTA reveals high-grade stenosis versus occlusion distal right M1 segment with hypoperfusion of right MCA branches.  Carotid artery and vertebral artery widely patent in the neck bilaterally.  I reviewed the images with Dr. Estanislado Pandy   Electronically Signed   By: Franchot Gallo M.D.   On: 03/22/2014 18:24    Positive ROS: All other systems have been reviewed and were otherwise negative with the exception of those mentioned in the HPI and as above.  Physical Exam: General: Alert, NAD Cardiovascular: No pedal edema Respiratory: No cyanosis, no use of accessory musculature GI: No organomegaly, abdomen is soft and non-tender Skin: No lesions in the area of chief complaint Neurologic: Sensation intact distally Psychiatric: Patient is competent for consent with normal mood and affect Lymphatic: No axillary or cervical lymphadenopathy  MUSCULOSKELETAL:  - left sided weakness - +DF/PF ankle - foot  wwp - 2+ pulses - severe pain with movement of hip  Assessment: Left IT hip fx  Plan: - currently being worked up and medically optimized for stroke, MRI brain ordered for today - not TPA or endovascular intervention candidate - cleared by cards for surgery - recommend surgery for hip fx for pain control and ability to rehab after stroke - discussed r/b/a including infection, neurovascular injury, fat embolism syndrome, anesthesia risks, worsening of CVA, MI, death - patient has been placed on the surgery schedule for Monday pm but if patient can be optimized for surgery for Sunday, please call number below and we can perform surgery tomorrow  - Based on history and fracture pattern this likely represents a fragility fracture. - Fragility fractures affect up to one half of women and one third of men after age 77 years and occur in the setting of bone disorder such as osteoporosis or osteopenia and warrant appropriate work-up. - The following are general recommendations that may serve as an outline for an appropriate work-up:  1.) Obtain bone density measurement to confirm presumptive diagnosis, assess severity of osteoporosis and risk of future fracture, and use as baseline for monitoring treatment  2.) Obtain laboratory tests: CBC, ESR, serum calcium, creatinine, albumin,phosphate, alkaline phosphatase, liver transaminases, protein electrophoresis, urinalysis, 25-hydroxyvitamin D.  3.) Exclude secondary causes of low bone mass and skeletal fragility (eg,multiple myeloma, lymphoma) as indicated.  4.) Obtain radiograph of thoracic and lumbar spine, particularly among individuals with back pain or height loss to assess presence of vertebral fractures  5.) Intermittent administration of recombinant human parathyroid hormone  6.) Optimize nutritional status using nutritional supplementation.  7.) Patient/family education to prevent future falls.  8.) Early mobilization and exercise  program - exercise decreases the rate of bone loss and has been associated with decreased rate of fragility fractures   Thank you for the consult and the opportunity to see Ms. Raczynski  N. Eduard Roux, MD [place] 12:41  PM

## 2014-03-23 NOTE — Evaluation (Signed)
Clinical/Bedside Swallow Evaluation Patient Details  Name: Erin [name] MRN: [id number] Date of Birth: [birth year]  Today's Date: 03/23/2014 Time: 1000-1035 SLP Time Calculation (min): 35 min  Past Medical History:  Past Medical History  Diagnosis Date  . Anxiety   . Arthritis   . Hyperlipidemia   . Essential hypertension, benign   . Osteoporosis   . GERD (gastroesophageal reflux disease)   . Hearing loss   . Restless leg   . Tremor   . Leg edema   . Obese   . Gall bladder disease   . Cataract   . Carpal tunnel syndrome of left wrist   . Atrial fibrillation   . Sleep apnea   . Esophageal dilatation   . Frequent falls   . Parkinson disease   . History of nuclear stress test 08/31/2012    Lexiscan cardiolite negative for ischemia  . Polyneuropathy in other diseases classified elsewhere 10/03/2013   Past Surgical History:  Past Surgical History  Procedure Laterality Date  . Appendectomy    . Cholecystectomy    . Abdominal hysterectomy    . Fracture surgery      Left arm x4  . Benign cyst removed from kidney and lung, bilateral mastectomy    . Bilateral great toenail removal    . Knee arthroscopy  2012    Right knee, Dr Aline Brochure  . Left forearm    . Breast surgery      Bilateral 2000  . Tonsillectomy    . R hand surgery    . Bronchoscopy  02/15/2000  . Right vats.  "  . Right upper lobe wedge resection.  "  . Upper gastrointestinal endoscopy  11/25/1999    Esophagitis/ Normal proximal esophagus, stomach and duodenum  . Colonoscopy  01/17/2009    Dr. Deatra Ina : Internal hemorrhoids/Diverticula, scattered in the ascending colon/  Moderate diverticulosis ascending colon to sigmoid colon  . Esophagogastroduodenoscopy   04/23/2003    Dr. Deatra Ina: HIATAL HERNIA  . Colonoscopy  01/16/2001    Normal  . Cataract extraction, bilateral    . Esophagogastroduodenoscopy  03/20/2012    VOZ:DGUYQIHK ring-LIKELY CAUSING MILD DYSPHAGIA/SMALL hiatal hernia/Multiple sessile polyps  ranging between 3-102mm , path benign  . Cataract extraction    . Carpal tunnel release      Left wrist  . Transthoracic echocardiogram  06/24/2009    EF=>55%, mild assymetric LVH; LA mildly dilated; mild mitral annular calcif, borderline MVP, mild-mod MR; mild-mod TR, RV systolic pressure elevated, mild pulm HTN; AV mildly sclerotic; mild pulm valve regurg - ordered r/t bradycardia   . Endovenous ablation saphenous vein w/ laser  01/2011    Right GSV  . Cholecystectomy     HPI:  80 yo female h/o freq falls, cafib, parkinsons who came in after mechanical fall while in her kitchen, sustaining a displaced intertrochanteric fracture of the left hip.She lives with her son. She has atrial fibrillation without anticoagulation secondary to frequent falls and risk of bleeding. On 03/22/2014 RN noted that the pt was unable to swallow medications and a Code Stroke was initiated. She was later noted to have slurred speech, left facial droop, and an inability to move her left arm, and a right gaze preference. CT shows acute infarct in the right basal ganglia and right parietal white matter.   Assessment / Plan / Recommendation Clinical Impression  Pt presents with primary oral dysphagia likely impacting oropharyngeal function. She has decreased strength and range of motion on her left side  resulting in a poor anterior lip seal and reduced ability to manipulate the bolus. Signs of aspiration are noted with thin liquids (delayed coughing, wet voice). With SLP providing verbal cues to swallow a second time pt was able to swallow nectar thick liquids without overt signs of aspiration. Recommend a dysphagia 1 (puree) diet with honey thick  liquids administered by spoon and full supervision for multiple swallows. SLP to follow up with therapy for compensatory strategies and to progress texture.    Aspiration Risk  Moderate    Diet Recommendation Dysphagia 1 (Puree);Honey-thick liquid   Liquid Administration via:  Spoon Medication Administration: Crushed with puree Supervision: Full supervision/cueing for compensatory strategies Compensations: Check for anterior loss;Multiple dry swallows after each bite/sip Postural Changes and/or Swallow Maneuvers: Seated upright 90 degrees    Other  Recommendations     Follow Up Recommendations       Frequency and Duration min 2x/week  2 weeks   Pertinent Vitals/Pain none    SLP Swallow Goals     Swallow Study Prior Functional Status       General Date of Onset: 03/20/14 HPI: 80 yo female h/o freq falls, cafib, parkinsons who came in after mechanical fall while in her kitchen, sustaining a displaced intertrochanteric fracture of the left hip.She lives with her son. She has atrial fibrillation without anticoagulation secondary to frequent falls and risk of bleeding. On 03/22/2014 RN noted that the pt was unable to swallow medications and a Code Stroke was initiated. She was later noted to have slurred speech, left facial droop, and an inability to move her left arm, and a right gaze preference. CT shows acute infarct in the right basal ganglia and right parietal white matter. Type of Study: Bedside swallow evaluation Diet Prior to this Study: NPO Temperature Spikes Noted: No Respiratory Status: Room air Behavior/Cognition: Cooperative;Pleasant mood;Alert Oral Cavity - Dentition: Adequate natural dentition Self-Feeding Abilities: Needs assist Patient Positioning: Upright in bed Baseline Vocal Quality: Clear Volitional Swallow: Able to elicit    Oral/Motor/Sensory Function Overall Oral Motor/Sensory Function: Impaired Labial ROM: Reduced left Labial Symmetry: Abnormal symmetry left Labial Strength: Reduced Lingual ROM: Reduced left Lingual Strength: Reduced   Ice Chips Ice chips: Within functional limits Presentation: Spoon   Thin Liquid Thin Liquid: Impaired Presentation: Cup;Straw Oral Phase Impairments: Reduced labial seal Oral Phase  Functional Implications: Right anterior spillage;Left anterior spillage Pharyngeal  Phase Impairments: Wet Vocal Quality;Cough - Delayed    Nectar Thick Nectar Thick Liquid: Within functional limits   Honey Thick     Puree Puree: Within functional limits Presentation: Spoon;Self Fed   Solid   GO            Eden Emms 03/23/2014,11:16 AM

## 2014-03-23 NOTE — Evaluation (Signed)
Supervised and reviewed by Shara Hartis MA CCC-SLP  

## 2014-03-23 NOTE — Progress Notes (Signed)
PT Cancellation Note  Patient Details Name: Erin [name] MRN: [id number] DOB: March 23, 1930   Cancelled Treatment:    Reason Eval/Treat Not Completed: Patient not medically ready (patient with active bedrest orders)   Duncan Dull 03/23/2014, 6:55 AM Alben Deeds, PT DPT  [phone]

## 2014-03-24 DIAGNOSIS — I369 Nonrheumatic tricuspid valve disorder, unspecified: Secondary | ICD-10-CM

## 2014-03-24 DIAGNOSIS — I6789 Other cerebrovascular disease: Secondary | ICD-10-CM

## 2014-03-24 DIAGNOSIS — I6350 Cerebral infarction due to unspecified occlusion or stenosis of unspecified cerebral artery: Secondary | ICD-10-CM

## 2014-03-24 DIAGNOSIS — I6340 Cerebral infarction due to embolism of unspecified cerebral artery: Secondary | ICD-10-CM

## 2014-03-24 LAB — TYPE AND SCREEN
ABO/RH(D): A POS
Antibody Screen: NEGATIVE
UNIT DIVISION: 0
Unit division: 0
Unit division: 0

## 2014-03-24 LAB — BASIC METABOLIC PANEL
Anion gap: 11 (ref 5–15)
BUN: 17 mg/dL (ref 6–23)
CALCIUM: 8.1 mg/dL — AB (ref 8.4–10.5)
CO2: 21 mEq/L (ref 19–32)
Chloride: 107 mEq/L (ref 96–112)
Creatinine, Ser: 0.52 mg/dL (ref 0.50–1.10)
GFR calc Af Amer: >90 mL/min (ref >90)
GFR, EST NON AFRICAN AMERICAN: 87 mL/min — AB (ref >90)
Glucose, Bld: 123 mg/dL — ABNORMAL HIGH (ref 70–99)
POTASSIUM: 4.1 meq/L (ref 3.7–5.3)
Sodium: 139 mEq/L (ref 137–147)

## 2014-03-24 LAB — CBC
HCT: 31.6 % — ABNORMAL LOW (ref 36.0–46.0)
Hemoglobin: 10.7 g/dL — ABNORMAL LOW (ref 12.0–15.0)
MCH: 32.3 pg (ref 26.0–34.0)
MCHC: 33.9 g/dL (ref 30.0–36.0)
MCV: 95.5 fL (ref 78.0–100.0)
Platelets: 194 10*3/uL (ref 150–400)
RBC: 3.31 MIL/uL — ABNORMAL LOW (ref 3.87–5.11)
RDW: 12.9 % (ref 11.5–15.5)
WBC: 8.9 10*3/uL (ref 4.0–10.5)

## 2014-03-24 MED ORDER — CHLORHEXIDINE GLUCONATE 4 % EX LIQD
60.0000 mL | Freq: Once | CUTANEOUS | Status: AC
  Administered 2014-03-24: 4 via TOPICAL
  Filled 2014-03-24: qty 60

## 2014-03-24 MED ORDER — BISACODYL 10 MG RE SUPP
10.0000 mg | Freq: Once | RECTAL | Status: AC
  Administered 2014-03-24: 10 mg via RECTAL
  Filled 2014-03-24: qty 1

## 2014-03-24 MED ORDER — SODIUM CHLORIDE 0.9 % IV SOLN
INTRAVENOUS | Status: DC
  Administered 2014-03-24: 15:00:00 via INTRAVENOUS
  Administered 2014-03-25: 125 mL/h via INTRAVENOUS

## 2014-03-24 MED ORDER — CEFAZOLIN SODIUM-DEXTROSE 2-3 GM-% IV SOLR
2.0000 g | INTRAVENOUS | Status: AC
  Administered 2014-03-25: 2 g via INTRAVENOUS
  Filled 2014-03-24: qty 50

## 2014-03-24 MED ORDER — SENNOSIDES-DOCUSATE SODIUM 8.6-50 MG PO TABS
1.0000 | ORAL_TABLET | Freq: Two times a day (BID) | ORAL | Status: DC
  Administered 2014-03-24 – 2014-03-28 (×7): 1 via ORAL
  Filled 2014-03-24 (×10): qty 1

## 2014-03-24 NOTE — Progress Notes (Signed)
PT Cancellation Note  Patient Details Name: Erin [name] MRN: [id number] DOB: 1929/09/20   Cancelled Treatment:    Reason Eval/Treat Not Completed: Patient not medically ready Stroke workup continuing, and pt being optimized for surgery for hip intertrochanteric fx, which will likely happen this afternoon;   Will hold PT today and follow up for PT evaluation tomorrow, with new orders from Ortho; Thanks,  Roney Marion, PT  Acute Rehabilitation Services Pager [phone] Office [phone]    [place] 03/24/2014, 7:30 AM

## 2014-03-24 NOTE — Progress Notes (Signed)
*  PRELIMINARY RESULTS* Echocardiogram 2D Echocardiogram has been performed.  Leavy Cella 03/24/2014, 10:23 AM

## 2014-03-24 NOTE — Progress Notes (Signed)
STROKE TEAM PROGRESS NOTE   HISTORY Erin [name] is an 80 y.o. female who lives at home and had a fall resulting in left hip fracture on 9/9. [facility]. She was noted to be confused at that time but today staff noted she had a left sided deficit. Code stroke was not called due to CT findings showing > 1/3 right MCA infarct. Patient was transferred to [facility] hospital for further evaluation and possible endovascular intervention. On arrival patient was alert, able to stated name and follow minimal commands. She showed a left sided paresis and right gaze preference. IR was called and they recommended CT perfusion to evaluate for mismatch.  Date last known well: Date: 03/21/2014  Time last known well: Unable to determine there was a change noted at 10:30 this AM but it was also noted she was more confused yesterday.  tPA Given: No: out of the window  She was admitted to the neuro ICU for further evaluation and treatment.  SUBJECTIVE (INTERVAL HISTORY) Son was at the bedside during rounds. I also talked with her niece over the phone and updated her about pt situation. Overall she feels her condition is stable. She was still sleepy and her son stated that she did not get good sleep last night due to hallucinations. She still follows commands and orientated to people. Left UE muscle strength improved.  OBJECTIVE Temp:  [98.1 F (36.7 C)-98.7 F (37.1 C)] 98.6 F (37 C) (09/13 1212) Pulse Rate:  [63-97] 70 (09/13 1100) Cardiac Rhythm:  [-] Atrial fibrillation (09/13 0600) Resp:  [14-32] 16 (09/13 1200) BP: (87-144)/(38-105) 131/98 mmHg (09/13 1100) SpO2:  [96 %-100 %] 97 % (09/13 1200)   Recent Labs Lab 03/22/14 1443 03/22/14 1711 03/22/14 2015 03/23/14 0010 03/23/14 0413  GLUCAP 124* 98 107* 116* 136*    Recent Labs Lab 03/20/14 1721 03/21/14 0516 03/24/14 1250  NA 143 141 139  K 4.5 4.0 4.1  CL 106 106 107  CO2 26 24 21   GLUCOSE 128* 139* 123*  BUN 20 17 17    CREATININE 0.98 0.65 0.52  CALCIUM 8.9 8.4 8.1*   No results found for this basename: AST, ALT, ALKPHOS, BILITOT, PROT, ALBUMIN,  in the last 168 hours  Recent Labs Lab 03/20/14 1721 03/21/14 0516 03/24/14 1250  WBC 10.0 13.3* 8.9  NEUTROABS 6.9  --   --   HGB 12.7 11.9* 10.7*  HCT 38.2 35.7* 31.6*  MCV 98.7 98.1 95.5  PLT 193 181 194   No results found for this basename: CKTOTAL, CKMB, CKMBINDEX, TROPONINI,  in the last 168 hours No results found for this basename: LABPROT, INR,  in the last 72 hours No results found for this basename: COLORURINE, APPERANCEUR, LABSPEC, [place], GLUCOSEU, HGBUR, BILIRUBINUR, KETONESUR, PROTEINUR, UROBILINOGEN, NITRITE, LEUKOCYTESUR,  in the last 72 hours     Component Value Date/Time   CHOL 208* 03/23/2014 0425   TRIG 91 03/23/2014 0425   HDL 56 03/23/2014 0425   CHOLHDL 3.7 03/23/2014 0425   VLDL 18 03/23/2014 0425   LDLCALC 134* 03/23/2014 0425   Lab Results  Component Value Date   HGBA1C 6.1* 03/22/2014   No results found for this basename: labopia,  cocainscrnur,  labbenz,  amphetmu,  thcu,  labbarb    No results found for this basename: ETH,  in the last 168 hours  Ct Angio Head and neck W/cm &/or Wo Cm  03/22/2014   IMPRESSION: Acute infarct in the right basal ganglia  and right parietal white matter. There is decreased perfusion of a much larger area of the right temporal parietal lobe in the right MCA territory compatible with tissue at risk of further infarction.  CTA reveals high-grade stenosis versus occlusion distal right M1 segment with hypoperfusion of right MCA branches.  Carotid artery and vertebral artery widely patent in the neck bilaterally.   Ct Head Wo Contrast  03/22/2014   IMPRESSION: There is a decreased attenuation in the right parietal lobe/ distribution of the right middle cerebral artery consistent with acute to subacute ischemia.   Venous doppler - Lower extremity venous duplex has been completed. Preliminary  findings: no evidence of DVT  MRI brain - Acute right MCA infarct involving the deep white matter, external  capsule, and a small amount of the insular cortex. No associated  hemorrhage or midline shift.  Generalized atrophy with mild chronic microvascular ischemia.  2D echo - Left ventricle: The cavity size was normal. There was mild concentric hypertrophy. Systolic function was normal. The estimated ejection fraction was in the range of 55% to 60%. Wall motion was normal; there were no regional wall motion abnormalities. - Left atrium: The atrium was mildly dilated. - Right ventricle: The cavity size was mildly dilated. - Right atrium: The atrium was mildly dilated. - Tricuspid valve: There was moderate regurgitation directed centrally. - Pulmonary arteries: Systolic pressure was mildly increased. PA peak pressure: 38 mm Hg (S).   PHYSICAL EXAM  Temp:  [98.1 F (36.7 C)-98.7 F (37.1 C)] 98.6 F (37 C) (09/13 1212) Pulse Rate:  [63-97] 70 (09/13 1100) Resp:  [14-32] 16 (09/13 1200) BP: (87-144)/(38-105) 131/98 mmHg (09/13 1100) SpO2:  [96 %-100 %] 97 % (09/13 1200)  General - Well nourished, well developed, lethargic.  Ophthalmologic - not cooperative on exam.  Cardiovascular - irregularly irregular HR and rhythm.  Mental Status -  sleepy, but open eyes on voice, naming and repeating intact, follows simple commands and language fluent. Orientated to people and self, but not to time and place.  Cranial Nerves II - XII - II - not blink to threat on the right visual field. III, IV, VI - right eye gaze preference, but able to cross midline. V - Facial sensation intact bilaterally. VII - left facial droop. VIII - Hearing & vestibular intact bilaterally. X - Palate elevates symmetrically. XI - Chin turning & shoulder shrug intact bilaterally. XII - Tongue protrusion intact.  Motor Strength - LUE proximal 3/5, distal 0/5, LLE not able to move proximally due to pain but  able to wiggle toes, RUE 5-/5 and RLE 4+/5.  Reflexes - The patient's reflexes were 1+ in all extremities and she had no pathological reflexes.  Sensory - Light touch, temperature/pinprick were assessed and were symmetric.    Coordination - lethargic and not able to test.  Tremor was absent.  Gait and Station - not able to test.   ASSESSMENT/PLAN  Erin [name] is a 80 y.o. female with history of afib not on AC due to frequent falls, HTN, HLD, migraine was admitted [facility] for left hip fracture s/p fall, prepared for surgery but developed AMS and left sided weakness yesterday. Imaging consistent with right MCA stroke likely due to afib not on AC.   Stroke - right MCA territory, embolic secondary to afib not on [facility]     aspirin 81 mg orally every day prior to admission, now on aspirin 325 mg orally every day  MRI showed moderate sized right  MCA stroke, consistent with embolic pattern  CTA head and neck showed right MCA stroke but no LVO  2D Echo EF 55-60%  LDL 134, added lipitor for stroke prevention and HLD  HgbA1c 6.1 WNL  SCDs and heparin subq for VTE prophylaxis  Dysphagia honey thick liquids.   Bedrest for left hip fracture  Pt currently stable and will transfer to floor, however, not a candidate for hip surgery today. Will reassess in am.  Afib - chronic, not on AC due to frequent fall - currently rate well controlled - cardiology on board, appreciate their help - continue metoprolol for rate control - will hold off AC for 10-14 days due to potential surgery and recent moderate sized stroke - ASA 325mg  for now.  Hypertension   Home meds:  norvasc and metoprolol. metoprolo was resumed in hospital with holding parameters.  BP tends to be low, will continue the fluids  Permissive hypertension <220/120 for 24-48 hours  Hyperlipidemia  Home meds: lovastatin 20mg   LDL 134   Started lipitor 40mg    LDL goal < 100   Other Stroke Risk Factors Advanced  age   Obesity, Body mass index is 25.53 kg/(m^2).   Other Active Problems  Left hip fracture - ortho on board  Surgery when stable  Prefer spinal anesthesia  Avoid hypotension during surgery  Other Pertinent History  Migraine on topamax at home  Hospital day # 4  This patient is critically ill due to large right MCA stroke and afib not on anticoagulation and at significant risk of neurological worsening, death form recurrent stroke, brain herniation and cerebral edema. She also has left hip fracture not candidate for surgery now, which also increase her risk of mortality. This patient's care requires constant monitoring of vital signs, hemodynamics, respiratory and cardiac monitoring, review of multiple databases, neurological assessment, discussion with son and other family memebers, other specialists and medical decision making of high complexity. I spent 35 minutes of neurocritical care time in the care of this patient.   Rosalin Hawking, MD PhD Stroke Neurology 03/24/2014 2:27 PM   To contact Stroke Continuity provider, please refer to [email or web address] After hours, contact General Neurology

## 2014-03-24 NOTE — Progress Notes (Signed)
    Subjective:  Awake and alert, confused.  Objective:  Vital Signs in the last 24 hours: Temp:  [98.1 F (36.7 C)-98.7 F (37.1 C)] 98.1 F (36.7 C) (09/13 0750) Pulse Rate:  [63-97] 70 (09/13 1100) Resp:  [14-32] 20 (09/13 1100) BP: (87-144)/(38-105) 131/98 mmHg (09/13 1100) SpO2:  [96 %-100 %] 99 % (09/13 1100)  Intake/Output from previous day:  Intake/Output Summary (Last 24 hours) at 03/24/14 1128 Last data filed at 03/24/14 0600  Gross per 24 hour  Intake    700 ml  Output    625 ml  Net     75 ml    Physical Exam: General appearance: alert, cooperative, no distress, moderately obese and disoriented to place Lungs: decreased breath sounds Heart: irregularly irregular rhythm Neurologic: Lt hemiparesis   Rate: 76  Rhythm: atrial fibrillation  Lab Results: No results found for this basename: WBC, HGB, PLT,  in the last 72 hours No results found for this basename: NA, K, CL, CO2, GLUCOSE, BUN, CREATININE,  in the last 72 hours No results found for this basename: TROPONINI, CK, MB,  in the last 72 hours No results found for this basename: INR,  in the last 72 hours  Imaging: Imaging results have been reviewed  Cardiac Studies: Echo pending, done this am  Assessment/Plan:  Pt is an 80 y.o.female patient with known history of chronic atrial fib, not a anticoagulation candidate due to frequent falls, hypertension, hyperlipidemia, and Parkinson's Disease, who presented to [facility] ER 03/20/14 after mechanical fall while in her kitchen, sustaining a displaced intertrochanteric fracture of the left hip. She is followed by Dr. Debara Pickett. She has had a Myoview in 2014 that was negative for ischemia. She was admitted to [facility] and cleared for hip surgery but the suffered an acute Rt brain CVA. She was transferred to [facility] but not felt to be a candidate for TPA or intervention. AF rate has been controlled.    Principal Problem:   Fracture, intertrochanteric, left femur Active Problems:  DNR (do not resuscitate)   Fall at home   Cerebral embolism with cerebral infarction   Chronic atrial fibrillation   Preoperative cardiovascular examination   Essential hypertension, benign    PLAN: MD to review echo.   Kerin Ransom PA-C Beeper [phone] 03/24/2014, 11:28 AM   I have seen and examined the patient along with Susy Manor, PA NP.  I have reviewed the chart, notes and new data.  I agree with PA/NP's note.  Echo shows a dilated left atrium, normal LV function, no major valvular abnormalities. AF is well rate controlled. Unable to anticoagulate at this time. Will need long term anticoagulation once safe.   Sanda Klein, MD, [place] [phone] 03/24/2014, 12:22 PM

## 2014-03-24 NOTE — Progress Notes (Signed)
Patient not stable for surgery today. Will reassess tomorrow am. NPO after midnight.  Azucena Cecil, MD [place] 3:59 PM

## 2014-03-25 ENCOUNTER — Encounter (HOSPITAL_COMMUNITY): Payer: Medicare Other | Admitting: Certified Registered Nurse Anesthetist

## 2014-03-25 ENCOUNTER — Inpatient Hospital Stay (HOSPITAL_COMMUNITY): Payer: Medicare Other | Admitting: Certified Registered Nurse Anesthetist

## 2014-03-25 ENCOUNTER — Encounter (HOSPITAL_COMMUNITY): Payer: Self-pay | Admitting: Certified Registered Nurse Anesthetist

## 2014-03-25 ENCOUNTER — Inpatient Hospital Stay (HOSPITAL_COMMUNITY): Payer: Medicare Other

## 2014-03-25 ENCOUNTER — Encounter (HOSPITAL_COMMUNITY)
Admission: EM | Disposition: A | Discharge status: Rehabilitation Facility | Payer: Self-pay | Source: Home / Self Care | Attending: Neurology

## 2014-03-25 DIAGNOSIS — Z5189 Encounter for other specified aftercare: Secondary | ICD-10-CM

## 2014-03-25 DIAGNOSIS — Z66 Do not resuscitate: Secondary | ICD-10-CM

## 2014-03-25 DIAGNOSIS — Z9181 History of falling: Secondary | ICD-10-CM

## 2014-03-25 HISTORY — PX: INTRAMEDULLARY (IM) NAIL INTERTROCHANTERIC: SHX5875

## 2014-03-25 LAB — BASIC METABOLIC PANEL
ANION GAP: 13 (ref 5–15)
BUN: 17 mg/dL (ref 6–23)
CALCIUM: 8.1 mg/dL — AB (ref 8.4–10.5)
CO2: 20 meq/L (ref 19–32)
Chloride: 107 mEq/L (ref 96–112)
Creatinine, Ser: 0.5 mg/dL (ref 0.50–1.10)
GFR, EST NON AFRICAN AMERICAN: 88 mL/min — AB (ref >90)
Glucose, Bld: 122 mg/dL — ABNORMAL HIGH (ref 70–99)
Potassium: 4.4 mEq/L (ref 3.7–5.3)
SODIUM: 140 meq/L (ref 137–147)

## 2014-03-25 LAB — CBC
HCT: 33 % — ABNORMAL LOW (ref 36.0–46.0)
Hemoglobin: 11.3 g/dL — ABNORMAL LOW (ref 12.0–15.0)
MCH: 32.8 pg (ref 26.0–34.0)
MCHC: 34.2 g/dL (ref 30.0–36.0)
MCV: 95.9 fL (ref 78.0–100.0)
PLATELETS: 182 10*3/uL (ref 150–400)
RBC: 3.44 MIL/uL — AB (ref 3.87–5.11)
RDW: 12.9 % (ref 11.5–15.5)
WBC: 11.8 10*3/uL — ABNORMAL HIGH (ref 4.0–10.5)

## 2014-03-25 LAB — GLUCOSE, CAPILLARY
Glucose-Capillary: 106 mg/dL — ABNORMAL HIGH (ref 70–99)
Glucose-Capillary: 105 mg/dL — ABNORMAL HIGH (ref 70–99)

## 2014-03-25 SURGERY — FIXATION, FRACTURE, INTERTROCHANTERIC, WITH INTRAMEDULLARY ROD
Anesthesia: Monitor Anesthesia Care | Site: Leg Upper | Laterality: Left

## 2014-03-25 MED ORDER — ALUM & MAG HYDROXIDE-SIMETH 200-200-20 MG/5ML PO SUSP
30.0000 mL | ORAL | Status: DC | PRN
Start: 2014-03-25 — End: 2014-03-28
  Administered 2014-03-27 – 2014-03-28 (×2): 30 mL via ORAL
  Filled 2014-03-25 (×2): qty 30

## 2014-03-25 MED ORDER — FENTANYL CITRATE 0.05 MG/ML IJ SOLN
INTRAMUSCULAR | Status: AC
  Filled 2014-03-25: qty 5

## 2014-03-25 MED ORDER — PHENOL 1.4 % MT LIQD
1.0000 | OROMUCOSAL | Status: DC | PRN
Start: 2014-03-25 — End: 2014-03-28
  Filled 2014-03-25: qty 177

## 2014-03-25 MED ORDER — PANTOPRAZOLE SODIUM 40 MG PO TBEC
40.0000 mg | DELAYED_RELEASE_TABLET | Freq: Every day | ORAL | Status: DC
  Administered 2014-03-26 – 2014-03-28 (×3): 40 mg via ORAL
  Filled 2014-03-25 (×3): qty 1

## 2014-03-25 MED ORDER — METOCLOPRAMIDE HCL 10 MG PO TABS: 5.0000 mg | ORAL_TABLET | Freq: Three times a day (TID) | ORAL | Status: DC | PRN

## 2014-03-25 MED ORDER — MENTHOL 3 MG MT LOZG
1.0000 | LOZENGE | OROMUCOSAL | Status: DC | PRN
Start: 2014-03-25 — End: 2014-03-28
  Filled 2014-03-25: qty 9

## 2014-03-25 MED ORDER — METHOCARBAMOL 500 MG PO TABS
500.0000 mg | ORAL_TABLET | Freq: Four times a day (QID) | ORAL | Status: DC | PRN
  Filled 2014-03-25: qty 1

## 2014-03-25 MED ORDER — MORPHINE SULFATE 2 MG/ML IJ SOLN
0.5000 mg | INTRAMUSCULAR | Status: DC | PRN
  Administered 2014-03-26: 0.5 mg via INTRAVENOUS
  Filled 2014-03-25: qty 1

## 2014-03-25 MED ORDER — PHENYLEPHRINE HCL 10 MG/ML IJ SOLN
INTRAMUSCULAR | Status: DC | PRN
  Administered 2014-03-25: 40 ug via INTRAVENOUS

## 2014-03-25 MED ORDER — PHENYLEPHRINE HCL 10 MG/ML IJ SOLN
10.0000 mg | INTRAVENOUS | Status: DC | PRN
  Administered 2014-03-25: 25 ug/min via INTRAVENOUS

## 2014-03-25 MED ORDER — 0.9 % SODIUM CHLORIDE (POUR BTL) OPTIME
TOPICAL | Status: DC | PRN
  Administered 2014-03-25: 1000 mL

## 2014-03-25 MED ORDER — HYDROCODONE-ACETAMINOPHEN 5-325 MG PO TABS: 1.0000 | ORAL_TABLET | Freq: Four times a day (QID) | ORAL | Status: DC | PRN

## 2014-03-25 MED ORDER — ONDANSETRON HCL 4 MG/2ML IJ SOLN: 4.0000 mg | Freq: Once | INTRAMUSCULAR | Status: DC | PRN

## 2014-03-25 MED ORDER — FENTANYL CITRATE 0.05 MG/ML IJ SOLN
INTRAMUSCULAR | Status: DC | PRN
  Administered 2014-03-25: 25 ug via INTRAVENOUS
  Administered 2014-03-25: 50 ug via INTRAVENOUS
  Administered 2014-03-25: 25 ug via INTRAVENOUS

## 2014-03-25 MED ORDER — ASPIRIN 325 MG PO TABS: 325.0000 mg | ORAL_TABLET | Freq: Every day | ORAL | Status: DC

## 2014-03-25 MED ORDER — METOCLOPRAMIDE HCL 5 MG/ML IJ SOLN
5.0000 mg | Freq: Three times a day (TID) | INTRAMUSCULAR | Status: DC | PRN
  Filled 2014-03-25: qty 2

## 2014-03-25 MED ORDER — STERILE WATER FOR INJECTION IJ SOLN
INTRAMUSCULAR | Status: AC
  Filled 2014-03-25: qty 10

## 2014-03-25 MED ORDER — OXYCODONE HCL 5 MG PO TABS
5.0000 mg | ORAL_TABLET | ORAL | Status: DC | PRN
  Administered 2014-03-26: 5 mg via ORAL
  Administered 2014-03-27: 10 mg via ORAL
  Administered 2014-03-27: 5 mg via ORAL
  Administered 2014-03-27: 10 mg via ORAL
  Administered 2014-03-28 (×2): 5 mg via ORAL
  Filled 2014-03-25: qty 2
  Filled 2014-03-25 (×3): qty 1
  Filled 2014-03-25: qty 2
  Filled 2014-03-25: qty 1

## 2014-03-25 MED ORDER — CEFAZOLIN SODIUM-DEXTROSE 2-3 GM-% IV SOLR
INTRAVENOUS | Status: DC | PRN
  Administered 2014-03-25: 2 g via INTRAVENOUS

## 2014-03-25 MED ORDER — PROPOFOL 10 MG/ML IV BOLUS
INTRAVENOUS | Status: AC
  Filled 2014-03-25: qty 20

## 2014-03-25 MED ORDER — FENTANYL CITRATE 0.05 MG/ML IJ SOLN
12.5000 ug | INTRAMUSCULAR | Status: DC | PRN
  Administered 2014-03-25 – 2014-03-27 (×7): 50 ug via INTRAVENOUS
  Filled 2014-03-25 (×6): qty 2

## 2014-03-25 MED ORDER — SUCCINYLCHOLINE CHLORIDE 20 MG/ML IJ SOLN
INTRAMUSCULAR | Status: AC
  Filled 2014-03-25: qty 1

## 2014-03-25 MED ORDER — DEXTROSE 5 % IV SOLN
500.0000 mg | Freq: Four times a day (QID) | INTRAVENOUS | Status: DC | PRN
  Filled 2014-03-25: qty 5

## 2014-03-25 MED ORDER — ONDANSETRON HCL 4 MG/2ML IJ SOLN
4.0000 mg | Freq: Four times a day (QID) | INTRAMUSCULAR | Status: DC | PRN
Start: 2014-03-25 — End: 2014-03-28

## 2014-03-25 MED ORDER — EPHEDRINE SULFATE 50 MG/ML IJ SOLN
INTRAMUSCULAR | Status: AC
  Filled 2014-03-25: qty 1

## 2014-03-25 MED ORDER — LACTATED RINGERS IV SOLN
INTRAVENOUS | Status: DC | PRN
  Administered 2014-03-25: 14:00:00 via INTRAVENOUS

## 2014-03-25 MED ORDER — ONDANSETRON HCL 4 MG/2ML IJ SOLN
INTRAMUSCULAR | Status: AC
  Filled 2014-03-25: qty 2

## 2014-03-25 MED ORDER — CEFAZOLIN SODIUM-DEXTROSE 2-3 GM-% IV SOLR
2.0000 g | Freq: Four times a day (QID) | INTRAVENOUS | Status: AC
  Administered 2014-03-25 – 2014-03-26 (×3): 2 g via INTRAVENOUS
  Filled 2014-03-25 (×3): qty 50

## 2014-03-25 MED ORDER — ASPIRIN EC 325 MG PO TBEC: 325.0000 mg | DELAYED_RELEASE_TABLET | Freq: Every day | ORAL | Status: DC

## 2014-03-25 MED ORDER — CEFAZOLIN SODIUM-DEXTROSE 2-3 GM-% IV SOLR
2.0000 g | INTRAVENOUS | Status: AC
  Filled 2014-03-25: qty 50

## 2014-03-25 MED ORDER — ACETAMINOPHEN 325 MG PO TABS
650.0000 mg | ORAL_TABLET | Freq: Four times a day (QID) | ORAL | Status: DC | PRN
  Administered 2014-03-27: 650 mg via ORAL
  Filled 2014-03-25: qty 2

## 2014-03-25 MED ORDER — ROCURONIUM BROMIDE 50 MG/5ML IV SOLN
INTRAVENOUS | Status: AC
  Filled 2014-03-25: qty 1

## 2014-03-25 MED ORDER — SODIUM CHLORIDE 0.9 % IV SOLN
INTRAVENOUS | Status: DC
  Administered 2014-03-27: 20:00:00 via INTRAVENOUS

## 2014-03-25 MED ORDER — FENTANYL CITRATE 0.05 MG/ML IJ SOLN: 25.0000 ug | INTRAMUSCULAR | Status: DC | PRN

## 2014-03-25 MED ORDER — LIDOCAINE HCL (CARDIAC) 20 MG/ML IV SOLN
INTRAVENOUS | Status: AC
  Filled 2014-03-25: qty 5

## 2014-03-25 MED ORDER — SODIUM CHLORIDE 0.9 % IV SOLN
INTRAVENOUS | Status: DC
  Administered 2014-03-25 – 2014-03-26 (×2): via INTRAVENOUS

## 2014-03-25 MED ORDER — HEPARIN SODIUM (PORCINE) 5000 UNIT/ML IJ SOLN
5000.0000 [IU] | Freq: Two times a day (BID) | INTRAMUSCULAR | Status: DC
  Administered 2014-03-25 – 2014-03-28 (×6): 5000 [IU] via SUBCUTANEOUS
  Filled 2014-03-25 (×6): qty 1

## 2014-03-25 MED ORDER — ONDANSETRON HCL 4 MG PO TABS: 4.0000 mg | ORAL_TABLET | Freq: Four times a day (QID) | ORAL | Status: DC | PRN

## 2014-03-25 MED ORDER — PROPOFOL 10 MG/ML IV BOLUS
INTRAVENOUS | Status: DC | PRN
Start: 2014-03-25 — End: 2014-03-25
  Administered 2014-03-25 (×7): 10 mg via INTRAVENOUS

## 2014-03-25 MED ORDER — ACETAMINOPHEN 650 MG RE SUPP: 650.0000 mg | Freq: Four times a day (QID) | RECTAL | Status: DC | PRN

## 2014-03-25 MED ORDER — MIDAZOLAM HCL 2 MG/2ML IJ SOLN
INTRAMUSCULAR | Status: AC
  Filled 2014-03-25: qty 2

## 2014-03-25 MED ORDER — KETOROLAC TROMETHAMINE 15 MG/ML IJ SOLN
15.0000 mg | Freq: Once | INTRAMUSCULAR | Status: AC
  Administered 2014-03-25: 15 mg via INTRAMUSCULAR
  Filled 2014-03-25 (×2): qty 1

## 2014-03-25 MED ORDER — CHLORHEXIDINE GLUCONATE 4 % EX LIQD
60.0000 mL | Freq: Once | CUTANEOUS | Status: AC
  Administered 2014-03-25: 4 via TOPICAL

## 2014-03-25 MED ORDER — CETYLPYRIDINIUM CHLORIDE 0.05 % MT LIQD
7.0000 mL | Freq: Two times a day (BID) | OROMUCOSAL | Status: DC
Start: 2014-03-25 — End: 2014-03-28
  Administered 2014-03-25 – 2014-03-27 (×5): 7 mL via OROMUCOSAL

## 2014-03-25 SURGICAL SUPPLY — 48 items
BLADE SURG 15 STRL LF DISP TIS (BLADE) ×1 IMPLANT
BLADE SURG 15 STRL SS (BLADE) ×3
BNDG COHESIVE 4X5 TAN NS LF (GAUZE/BANDAGES/DRESSINGS) ×3 IMPLANT
BNDG COHESIVE 6X5 TAN STRL LF (GAUZE/BANDAGES/DRESSINGS) IMPLANT
BNDG GAUZE ELAST 4 BULKY (GAUZE/BANDAGES/DRESSINGS) ×3 IMPLANT
COVER PERINEAL POST (MISCELLANEOUS) ×3 IMPLANT
COVER SURGICAL LIGHT HANDLE (MISCELLANEOUS) ×3 IMPLANT
DRAPE PROXIMA HALF (DRAPES) IMPLANT
DRAPE STERI IOBAN 125X83 (DRAPES) ×3 IMPLANT
DRSG MEPILEX BORDER 4X4 (GAUZE/BANDAGES/DRESSINGS) ×5 IMPLANT
DRSG MEPILEX BORDER 4X8 (GAUZE/BANDAGES/DRESSINGS) ×3 IMPLANT
DRSG PAD ABDOMINAL 8X10 ST (GAUZE/BANDAGES/DRESSINGS) ×6 IMPLANT
DURAPREP 26ML APPLICATOR (WOUND CARE) ×3 IMPLANT
ELECT CAUTERY BLADE 6.4 (BLADE) ×3 IMPLANT
ELECT REM PT RETURN 9FT ADLT (ELECTROSURGICAL) ×3
ELECTRODE REM PT RTRN 9FT ADLT (ELECTROSURGICAL) ×1 IMPLANT
FACESHIELD WRAPAROUND (MASK) ×3 IMPLANT
FACESHIELD WRAPAROUND OR TEAM (MASK) ×1 IMPLANT
GAUZE XEROFORM 5X9 LF (GAUZE/BANDAGES/DRESSINGS) ×3 IMPLANT
GLOVE SURG SYN 7.5  E (GLOVE) ×4
GLOVE SURG SYN 7.5 E (GLOVE) ×2 IMPLANT
GLOVE SURG SYN 7.5 PF PI (GLOVE) ×2 IMPLANT
GOWN STRL REUS W/ TWL LRG LVL3 (GOWN DISPOSABLE) ×1 IMPLANT
GOWN STRL REUS W/TWL LRG LVL3 (GOWN DISPOSABLE) ×3
GUIDE PIN 3.2MM (MISCELLANEOUS) ×3
GUIDE PIN ORTH 343X3.2XBRAD (MISCELLANEOUS) IMPLANT
GUIDE ROD 3.0 (MISCELLANEOUS) ×3
KIT BASIN OR (CUSTOM PROCEDURE TRAY) ×3 IMPLANT
KIT ROOM TURNOVER OR (KITS) ×3 IMPLANT
LINER BOOT UNIVERSAL DISP (MISCELLANEOUS) ×3 IMPLANT
MANIFOLD NEPTUNE II (INSTRUMENTS) ×3 IMPLANT
NAIL TRIGEN LEFT 10X38-125 (Nail) ×2 IMPLANT
NS IRRIG 1000ML POUR BTL (IV SOLUTION) ×3 IMPLANT
PACK GENERAL/GYN (CUSTOM PROCEDURE TRAY) ×3 IMPLANT
PAD ARMBOARD 7.5X6 YLW CONV (MISCELLANEOUS) ×6 IMPLANT
PAD CAST 4YDX4 CTTN HI CHSV (CAST SUPPLIES) ×2 IMPLANT
PADDING CAST COTTON 4X4 STRL (CAST SUPPLIES) ×6
ROD GUIDE 3.0 (MISCELLANEOUS) IMPLANT
SCREW LAG COMPR KIT 95/90 (Screw) ×2 IMPLANT
STAPLER VISISTAT 35W (STAPLE) ×3 IMPLANT
SUT VIC AB 0 CT1 27 (SUTURE) ×6
SUT VIC AB 0 CT1 27XBRD ANBCTR (SUTURE) ×2 IMPLANT
SUT VIC AB 2-0 CT1 27 (SUTURE) ×6
SUT VIC AB 2-0 CT1 TAPERPNT 27 (SUTURE) ×2 IMPLANT
SYR BULB IRRIGATION 50ML (SYRINGE) ×2 IMPLANT
TOWEL OR 17X24 6PK STRL BLUE (TOWEL DISPOSABLE) ×3 IMPLANT
TOWEL OR 17X26 10 PK STRL BLUE (TOWEL DISPOSABLE) ×3 IMPLANT
WATER STERILE IRR 1000ML POUR (IV SOLUTION) ×3 IMPLANT

## 2014-03-25 NOTE — Progress Notes (Signed)
OT Cancellation Note  Patient Details Name: Erin [name] MRN: [id number] DOB: 11-14-1929   Cancelled Treatment:    Reason Eval/Treat Not Completed: Other (comment) Pt scheduled for hip surgery. Please reorder OT after surgery. OT signing off. Thank you.  [place], OTR/L  [phone] 03/25/2014 03/25/2014, 1:49 PM

## 2014-03-25 NOTE — Op Note (Addendum)
   Date of Surgery: 03/25/2014  INDICATIONS: Erin [name] is a 80 y.o.-year-old female who had a mechanical fall and sustained a left hip fracture (intertrochanteric-type). The risks and benefits of the procedure discussed with the patient and family prior to the procedure and all questions were answered; consent was obtained.  PREOPERATIVE DIAGNOSIS: left hip fracture (intertrochanteric-type)   POSTOPERATIVE DIAGNOSIS: Same   PROCEDURE: Treatment of intertrochanteric fracture with intramedullary implant. CPT [phone]   SURGEON: N. Eduard Roux, M.D.   ANESTHESIA: general   IV FLUIDS AND URINE: See anesthesia record   ESTIMATED BLOOD LOSS: 200 cc  IMPLANTS: [employer] InterTAN 10 x 38, 95/90 lag screws  DRAINS: None.   COMPLICATIONS: None.   DESCRIPTION OF PROCEDURE: The patient was brought to the operating room and placed supine on the operating table. The patient's leg had been signed prior to the procedure. The patient had the anesthesia placed by the anesthesiologist. The prep verification and incision time-outs were performed to confirm that this was the correct patient, site, side and location. The patient had an SCD on the opposite lower extremity. The patient did receive antibiotics prior to the incision and was re-dosed during the procedure as needed at indicated intervals. The patient was positioned on the fracture table with the table in traction and internal rotation to reduce the hip. The well leg was placed in a hemi-lithotomy position and all bony prominences were well-padded. The patient had the lower extremity prepped and draped in the standard surgical fashion. The incision was made 4 finger breadths superior to the greater trochanter. A guide pin was inserted into the tip of the greater trochanter under fluoroscopic guidance. An opening reamer was used to gain access to the femoral canal. The nail length was measured and inserted down the femoral canal to its proper depth.  The appropriate version of insertion for the lag screw was found under fluoroscopy. A pin was inserted up the femoral neck through the jig. Then, a second antirotation pin was inserted inferior to the first pin. The length of the lag screw was then measured. The lag screw was inserted as near to center-center in the head as possible. The antirotation pin was then taken out and an interdigitating compression screw was placed in its place. The leg was taken out of traction, then the interdigitating compression screw was used to compress across the fracture. Compression was visualized on serial xrays. The wound was copiously irrigated with saline and the subcutaneous layer closed with 2.0 vicryl and the skin was reapproximated with staples. The wounds were cleaned and dried a final time and a sterile dressing was placed. The hip was taken through a range of motion at the end of the case under fluoroscopic imaging to visualize the approach-withdraw phenomenon and confirm implant length in the head. The patient was then awakened from anesthesia and taken to the recovery room in stable condition. All counts were correct at the end of the case.   POSTOPERATIVE PLAN: The patient will be weight bearing as tolerated and will return in 2 weeks for staple removal.  She may begin DVT prophylaxis and anticoagulation tomorrow.   Azucena Cecil, MD [place] 4:29 PM

## 2014-03-25 NOTE — Progress Notes (Signed)
RN calling for pain cntrol  Says patient has opioid allergies and that Ortho MD is asking fo help with pain meds - ortho has written for oxycodone and toradol and wants to make sure medically ok to give. Patient already on fentany prn  Plan Increase fentanyl to q2h prn - hold for rass deeper than -2 toradol - reduce from q6h prn to single dose 15mg  IM x 1 Hold off on oral meds for pain control for the night due to more difficulty titrating  Dr. Brand Males, M.D., [facility].C.P Pulmonary and Critical Care Medicine Staff Physician [place] [facility]monary and Critical Care Pager: [phone], If no answer or between  15:00h - 7:00h: call [phone]  03/25/2014 10:13 PM

## 2014-03-25 NOTE — Transfer of Care (Signed)
Immediate Anesthesia Transfer of Care Note  Patient: Erin [name] [facility]  Procedure(s) Performed: Procedure(s): INTRAMEDULLARY NAIL INTERTROCHANTRIC LEFT HIP (Left)  Patient Location: PACU  Anesthesia Type:MAC and Spinal  Level of Consciousness: awake, alert  and oriented  Airway & Oxygen Therapy: Patient Spontanous Breathing  Post-op Assessment: Report given to PACU RN  Post vital signs: stable  Complications: No apparent anesthesia complications

## 2014-03-25 NOTE — Progress Notes (Signed)
    Subjective:  She is awake and oriented this am. She knew she was at [facility] and knew she had had a stroke.   Objective:  Vital Signs in the last 24 hours: Temp:  [97.7 F (36.5 C)-99.3 F (37.4 C)] 98.3 F (36.8 C) (09/14 0854) Pulse Rate:  [61-115] 72 (09/14 1100) Resp:  [14-27] 20 (09/14 1100) BP: (102-158)/(40-95) 123/40 mmHg (09/14 1100) SpO2:  [91 %-100 %] 100 % (09/14 1100)  Intake/Output from previous day:  Intake/Output Summary (Last 24 hours) at 03/25/14 1110 Last data filed at 03/25/14 1105  Gross per 24 hour  Intake   3890 ml  Output   1075 ml  Net   2815 ml    Physical Exam: General appearance: alert, cooperative and moderately obese Lungs: decreased breath sounds Heart: irregularly irregular rhythm   Rate: 70-90  Rhythm: atrial fibrillation  Lab Results:  Recent Labs  03/24/14 1250 03/25/14 0400  WBC 8.9 11.8*  HGB 10.7* 11.3*  PLT 194 182    Recent Labs  03/24/14 1250 03/25/14 0400  NA 139 140  K 4.1 4.4  CL 107 107  CO2 21 20  GLUCOSE 123* 122*  BUN 17 17  CREATININE 0.52 0.50   No results found for this basename: TROPONINI, CK, MB,  in the last 72 hours No results found for this basename: INR,  in the last 72 hours  Imaging: Imaging results have been reviewed  Cardiac Studies:  Assessment/Plan:  Pt is an 80 y.o.female patient with known history of chronic atrial fib- not a anticoagulation candidate due to frequent falls, hypertension, hyperlipidemia, and Parkinson's Disease, who presented to [facility] ER 03/20/14 after mechanical fall while in her kitchen, sustaining a displaced intertrochanteric fracture of the left hip. She is followed by Dr. Debara Pickett. She has had a Myoview in 2014 that was negative for ischemia. She was admitted to [facility] and cleared for hip surgery but the suffered an acute Rt brain CVA. She was transferred to [facility] but not felt to be a candidate for TPA or intervention. AF rate has been controlled   Principal Problem:  Fracture, intertrochanteric, left femur Active Problems:   DNR (do not resuscitate)   Fall at home   Cerebral embolism with cerebral infarction   Chronic atrial fibrillation   Preoperative cardiovascular examination   Essential hypertension, benign    PLAN: No change in cardiac Rx. Hip surgery when OK with neurology.   Kerin Ransom PA-C Beeper [phone] 03/25/2014, 11:10 AM

## 2014-03-25 NOTE — Progress Notes (Signed)
STROKE TEAM PROGRESS NOTE   HISTORY Erin [name] is an 80 y.o. female who lives at home and had a fall resulting in left hip fracture on 9/9. She was brought to [facility]. She was noted to be confused at that time but today staff noted she had a left sided deficit. Code stroke was not called due to CT findings showing > 1/3 right MCA infarct. Patient was transferred to [facility] hospital for further evaluation and possible endovascular intervention. On arrival patient was alert, able to stated name and follow minimal commands. She showed a left sided paresis and right gaze preference. IR was called and they recommended CT perfusion to evaluate for mismatch.  Date last known well: Date: 03/21/2014  Time last known well: Unable to determine there was a change noted at 10:30 this AM but it was also noted she was more confused yesterday.  tPA Given: No: out of the window  She was admitted to the neuro ICU for further evaluation and treatment.  SUBJECTIVE (INTERVAL HISTORY) Son was at the bedside during rounds. She was a little lethargic but better than yesterday. She stated that she slept well last night and there was no hallucinations. She denies any pain from left hip. I think she was doing well from my standpoint that she is able to tolerate the surgery today.  OBJECTIVE Temp:  [97.7 F (36.5 C)-99.3 F (37.4 C)] 98.3 F (36.8 C) (09/14 0854) Pulse Rate:  [61-115] 72 (09/14 1100) Cardiac Rhythm:  [-] Atrial fibrillation (09/14 0800) Resp:  [14-27] 20 (09/14 1100) BP: (104-158)/(40-95) 123/40 mmHg (09/14 1100) SpO2:  [91 %-100 %] 100 % (09/14 1100)   Recent Labs Lab 03/22/14 1711 03/22/14 2015 03/23/14 0010 03/23/14 0413 03/25/14 0848  GLUCAP 98 107* 116* 136* 106*    Recent Labs Lab 03/20/14 1721 03/21/14 0516 03/24/14 1250 03/25/14 0400  NA 143 141 139 140  K 4.5 4.0 4.1 4.4  CL 106 106 107 107  CO2 26 24 21 20   GLUCOSE 128* 139* 123* 122*  BUN 20 17 17 17   CREATININE  0.98 0.65 0.52 0.50  CALCIUM 8.9 8.4 8.1* 8.1*   No results found for this basename: AST, ALT, ALKPHOS, BILITOT, PROT, ALBUMIN,  in the last 168 hours  Recent Labs Lab 03/20/14 1721 03/21/14 0516 03/24/14 1250 03/25/14 0400  WBC 10.0 13.3* 8.9 11.8*  NEUTROABS 6.9  --   --   --   HGB 12.7 11.9* 10.7* 11.3*  HCT 38.2 35.7* 31.6* 33.0*  MCV 98.7 98.1 95.5 95.9  PLT 193 181 194 182   No results found for this basename: CKTOTAL, CKMB, CKMBINDEX, TROPONINI,  in the last 168 hours No results found for this basename: LABPROT, INR,  in the last 72 hours No results found for this basename: COLORURINE, APPERANCEUR, LABSPEC, [place], GLUCOSEU, HGBUR, BILIRUBINUR, KETONESUR, PROTEINUR, UROBILINOGEN, NITRITE, LEUKOCYTESUR,  in the last 72 hours     Component Value Date/Time   CHOL 208* 03/23/2014 0425   TRIG 91 03/23/2014 0425   HDL 56 03/23/2014 0425   CHOLHDL 3.7 03/23/2014 0425   VLDL 18 03/23/2014 0425   LDLCALC 134* 03/23/2014 0425   Lab Results  Component Value Date   HGBA1C 6.1* 03/22/2014   No results found for this basename: labopia,  cocainscrnur,  labbenz,  amphetmu,  thcu,  labbarb    No results found for this basename: ETH,  in the last 168 hours  Ct Angio Head and neck W/cm &/or Wo Cm  03/22/2014   IMPRESSION: Acute infarct in the right basal ganglia and right parietal white matter. There is decreased perfusion of a much larger area of the right temporal parietal lobe in the right MCA territory compatible with tissue at risk of further infarction.  CTA reveals high-grade stenosis versus occlusion distal right M1 segment with hypoperfusion of right MCA branches.  Carotid artery and vertebral artery widely patent in the neck bilaterally.   Ct Head Wo Contrast  03/22/2014   IMPRESSION: There is a decreased attenuation in the right parietal lobe/ distribution of the right middle cerebral artery consistent with acute to subacute ischemia.   Venous doppler - Lower extremity venous  duplex has been completed. Preliminary findings: no evidence of DVT  MRI brain - Acute right MCA infarct involving the deep white matter, external  capsule, and a small amount of the insular cortex. No associated  hemorrhage or midline shift.  Generalized atrophy with mild chronic microvascular ischemia.  2D echo - Left ventricle: The cavity size was normal. There was mild concentric hypertrophy. Systolic function was normal. The estimated ejection fraction was in the range of 55% to 60%. Wall motion was normal; there were no regional wall motion abnormalities. - Left atrium: The atrium was mildly dilated. - Right ventricle: The cavity size was mildly dilated. - Right atrium: The atrium was mildly dilated. - Tricuspid valve: There was moderate regurgitation directed centrally. - Pulmonary arteries: Systolic pressure was mildly increased. PA peak pressure: 38 mm Hg (S).   PHYSICAL EXAM  Temp:  [97.7 F (36.5 C)-99.3 F (37.4 C)] 98.3 F (36.8 C) (09/14 0854) Pulse Rate:  [61-115] 72 (09/14 1100) Resp:  [14-27] 20 (09/14 1100) BP: (104-158)/(40-95) 123/40 mmHg (09/14 1100) SpO2:  [91 %-100 %] 100 % (09/14 1100)  General - Well nourished, well developed, lethargic.  Ophthalmologic - not cooperative on exam.  Cardiovascular - irregularly irregular HR and rhythm.  Mental Status -  Mildly sleepy, but open eyes on voice, naming and repeating intact, follows simple commands and language fluent. Orientated to people and self and to time and place.  Cranial Nerves II - XII - II - visual field intact. III, IV, VI - right eye gaze preference, but able to cross midline. V - Facial sensation intact bilaterally. VII - left facial droop. VIII - Hearing & vestibular intact bilaterally. X - Palate elevates symmetrically. XI - Chin turning & shoulder shrug intact bilaterally. XII - Tongue protrusion intact.  Motor Strength - LUE proximal 3-/5, distal 2/5, LLE not able to move  proximally due to pain but able to wiggle toes at least 3+/5, RUE 5-/5 and RLE 4+/5.  Reflexes - The patient's reflexes were 1+ in all extremities and she had no pathological reflexes.  Sensory - Light touch, temperature/pinprick were assessed and were symmetric.    Coordination - right hand FTN intact.  Tremor was absent.  Gait and Station - not able to test.   ASSESSMENT/PLAN  Ms. Erin [name] is a 80 y.o. female with history of afib not on AC due to frequent falls, HTN, HLD, migraine was admitted to [facility] for left hip fracture s/p fall, prepared for surgery but developed AMS and left sided weakness yesterday. Imaging consistent with right MCA stroke likely due to afib not on AC.   Stroke - right MCA territory, embolic secondary to afib not on [facility]     aspirin 81 mg orally every day prior to admission, now on aspirin 325 mg orally every day  MRI showed moderate sized right MCA stroke, consistent with embolic pattern  CTA head and neck showed right MCA stroke but no LVO  2D Echo EF 55-60%  LDL 134, added lipitor for stroke prevention and HLD  HgbA1c 6.1 WNL  SCDs and heparin subq for VTE prophylaxis  Cardiac diet with honey think liquid.   Bedrest for left hip fracture  Pt currently stable and agree to proceed with left hip surgery. Prefer spinal anesthesia and avoid hypotension.  Afib - chronic, not on AC in the past due to frequent fall - currently rate well controlled - cardiology on board, appreciate their help - continue metoprolol for rate control - will hold off AC for 10-14 days due to potential surgery and recent moderate sized stroke - ASA 325mg  for now.  Hypertension   Home meds:  norvasc and metoprolol. metoprolo was resumed in hospital with holding parameters.  BP tends to be low, will continue the fluids  Permissive hypertension <220/120 for 24-48 hours  Hyperlipidemia  Home meds: lovastatin 20mg   LDL 134   Started lipitor 40mg    LDL goal <  100   Other Stroke Risk Factors Advanced age   Obesity, Body mass index is 25.53 kg/(m^2).   Other Active Problems  Left hip fracture - ortho on board  Agree to have surgery today since she is more stable from neuro standpoint  Prefer spinal anesthesia  Avoid hypotension during surgery  Other Pertinent History  Migraine on topamax at home  Hospital day # 5  Rosalin Hawking, MD PhD Stroke Neurology 03/25/2014 12:02 PM   To contact Stroke Continuity provider, please refer to [email or web address] After hours, contact General Neurology

## 2014-03-25 NOTE — Anesthesia Preprocedure Evaluation (Addendum)
Anesthesia Evaluation  Patient identified by MRN, date of birth, ID band Patient awake    Reviewed: Allergy & Precautions, H&P , NPO status , Patient's Chart, lab work & pertinent test results, reviewed documented beta blocker date and time   Airway Mallampati: II TM Distance: >3 FB     Dental  (+) Edentulous Upper, Edentulous Lower, Dental Advisory Given   Pulmonary sleep apnea ,  breath sounds clear to auscultation        Cardiovascular hypertension, Pt. on medications and Pt. on home beta blockers + Peripheral Vascular Disease     Neuro/Psych Anxiety  Neuromuscular disease CVA    GI/Hepatic Neg liver ROS, GERD-  ,  Endo/Other    Renal/GU negative Renal ROS     Musculoskeletal  (+) Arthritis -,   Abdominal (+)  Abdomen: soft. Bowel sounds: normal.  Peds  Hematology   Anesthesia Other Findings   Reproductive/Obstetrics                        Anesthesia Physical Anesthesia Plan  ASA: III  Anesthesia Plan: MAC and Spinal   Post-op Pain Management:    Induction: Intravenous  Airway Management Planned: Simple Face Mask  Additional Equipment:   Intra-op Plan:   Post-operative Plan:   Informed Consent: I have reviewed the patients History and Physical, chart, labs and discussed the procedure including the risks, benefits and alternatives for the proposed anesthesia with the patient or authorized representative who has indicated his/her understanding and acceptance.     Plan Discussed with: CRNA, Anesthesiologist and Surgeon  Anesthesia Plan Comments:         Anesthesia Quick Evaluation

## 2014-03-25 NOTE — Anesthesia Procedure Notes (Addendum)
Spinal  Patient location during procedure: OR Start time: 03/25/2014 3:16 PM End time: 03/25/2014 3:22 PM Staffing Anesthesiologist: HODIERNE, ADAM Performed by: anesthesiologist  Preanesthetic Checklist Completed: patient identified, site marked, surgical consent, pre-op evaluation, timeout performed, IV checked, risks and benefits discussed and monitors and equipment checked Spinal Block Patient position: right lateral decubitus Prep: Betadine Patient monitoring: heart rate, cardiac monitor, continuous pulse ox and blood pressure Approach: midline Location: L3-4 Injection technique: single-shot Needle Needle type: Pencan  Needle gauge: 24 G Needle length: 5 cm Assessment Sensory level: T8 Additional Notes Pt tolerated the procedure well.  Procedure Name: MAC Date/Time: 03/25/2014 3:20 PM Performed by: Maeola Harman Pre-anesthesia Checklist: Patient identified, Emergency Drugs available, Suction available, Patient being monitored and Timeout performed Patient Re-evaluated:Patient Re-evaluated prior to inductionOxygen Delivery Method: Simple face mask Preoxygenation: Pre-oxygenation with 100% oxygen Intubation Type: IV induction Placement Confirmation: positive ETCO2 Dental Injury: Teeth and Oropharynx as per pre-operative assessment

## 2014-03-25 NOTE — Discharge Instructions (Signed)
1. Change dressings as needed 2. Take pain meds as needed 3. May shower in 10 days after surgery

## 2014-03-25 NOTE — Progress Notes (Signed)
Pt. Seen and examined. Agree with the NP/PA-C note as written. Unfortunate patient of mine who has been deemed not an anticoagulation candidate due to frequent falls and was taken off warfarin over a year ago - she does have permanent a-fib and is at higher risk. She has now presented with a mechanical fall and has a hip fracture. She did have a low risk myoview in 2014. Unfortunately, she subsequently developed an acute right brain stroke - not clear if this is cardioembolic or represents a possibly fat embolus? Either way I would recommend proceeding with surgery.  Pixie Casino, MD, [facility] Attending Cardiologist [place]

## 2014-03-25 NOTE — Progress Notes (Signed)
Physical Therapy Discharge Patient Details Name: Erin [name] MRN: [id number] DOB: 04/14/1930 Today's Date: 03/25/2014 Time:  -     Patient discharged from PT services secondary to Pt awaiting surgical repair of hip. Will need new PT order after surgery..    GP     Alita Waldren 03/25/2014, 9:28 AM  Suanne Marker PT [phone]

## 2014-03-25 NOTE — Progress Notes (Signed)
SLP Cancellation Note  Patient Details Name: Erin [name] MRN: [id number] DOB: 05/10/1930   Cancelled treatment:       Reason Eval/Treat Not Completed: Patient at procedure or test/unavailable. NPO for possible hip surgery   Ikran Patman, Katherene Ponto 03/25/2014, 7:15 AM

## 2014-03-26 LAB — BASIC METABOLIC PANEL
Anion gap: 11 (ref 5–15)
BUN: 16 mg/dL (ref 6–23)
CALCIUM: 7.6 mg/dL — AB (ref 8.4–10.5)
CO2: 21 mEq/L (ref 19–32)
Chloride: 108 mEq/L (ref 96–112)
Creatinine, Ser: 0.46 mg/dL — ABNORMAL LOW (ref 0.50–1.10)
GFR calc Af Amer: >90 mL/min (ref >90)
GLUCOSE: 126 mg/dL — AB (ref 70–99)
Potassium: 3.7 mEq/L (ref 3.7–5.3)
Sodium: 140 mEq/L (ref 137–147)

## 2014-03-26 LAB — CBC
HEMATOCRIT: 30.5 % — AB (ref 36.0–46.0)
HEMOGLOBIN: 10.2 g/dL — AB (ref 12.0–15.0)
MCH: 32.2 pg (ref 26.0–34.0)
MCHC: 33.4 g/dL (ref 30.0–36.0)
MCV: 96.2 fL (ref 78.0–100.0)
Platelets: 191 10*3/uL (ref 150–400)
RBC: 3.17 MIL/uL — ABNORMAL LOW (ref 3.87–5.11)
RDW: 12.9 % (ref 11.5–15.5)
WBC: 8.9 10*3/uL (ref 4.0–10.5)

## 2014-03-26 MED ORDER — POTASSIUM CHLORIDE CRYS ER 20 MEQ PO TBCR
40.0000 meq | EXTENDED_RELEASE_TABLET | Freq: Once | ORAL | Status: AC
  Administered 2014-03-26: 40 meq via ORAL
  Filled 2014-03-26: qty 2

## 2014-03-26 MED ORDER — METOPROLOL TARTRATE 1 MG/ML IV SOLN
2.5000 mg | INTRAVENOUS | Status: DC | PRN
  Administered 2014-03-26: 5 mg via INTRAVENOUS
  Filled 2014-03-26: qty 5

## 2014-03-26 MED ORDER — BUPIVACAINE IN DEXTROSE 0.75-8.25 % IT SOLN
INTRATHECAL | Status: DC | PRN
  Administered 2014-03-25: 1.2 mL via INTRATHECAL

## 2014-03-26 MED ORDER — ASPIRIN 325 MG PO TABS
325.0000 mg | ORAL_TABLET | Freq: Every day | ORAL | Status: DC
  Administered 2014-03-26 – 2014-03-28 (×3): 325 mg via ORAL
  Filled 2014-03-26 (×3): qty 1

## 2014-03-26 MED ORDER — METOPROLOL TARTRATE 25 MG PO TABS
25.0000 mg | ORAL_TABLET | Freq: Two times a day (BID) | ORAL | Status: DC
  Administered 2014-03-26 – 2014-03-27 (×3): 25 mg via ORAL
  Filled 2014-03-26 (×4): qty 1

## 2014-03-26 NOTE — Progress Notes (Signed)
STROKE TEAM PROGRESS NOTE   HISTORY Erin [name] is an 80 y.o. female who lives at home and had a fall resulting in left hip fracture on 9/9. She was brought to [facility]. She was noted to be confused at that time but today staff noted she had a left sided deficit. Code stroke was not called due to CT findings showing > 1/3 right MCA infarct. Patient was transferred to [facility] hospital for further evaluation and possible endovascular intervention. On arrival patient was alert, able to stated name and follow minimal commands. She showed a left sided paresis and right gaze preference. IR was called and they recommended CT perfusion to evaluate for mismatch.  Date last known well: Date: 03/21/2014  Time last known well: Unable to determine there was a change noted at 10:30 this AM but it was also noted she was more confused yesterday.  tPA Given: No: out of the window  She was admitted to the neuro ICU for further evaluation and treatment.  SUBJECTIVE (INTERVAL HISTORY) No one was at the bedside during rounds. Overnight she was complaining of pain at left leg. Given toradol and morphine for pain control. This morning she only stated that she has pain only when she moves left leg, if not moving, no pain. She was awake and alert, conversing normally, a little lethargic though.  OBJECTIVE Temp:  [97.8 F (36.6 C)-99.2 F (37.3 C)] 97.8 F (36.6 C) (09/15 1845) Pulse Rate:  [73-142] 97 (09/15 1845) Cardiac Rhythm:  [-] Atrial fibrillation (09/15 0754) Resp:  [15-31] 18 (09/15 1845) BP: (89-141)/(42-71) 134/57 mmHg (09/15 1845) SpO2:  [89 %-100 %] 99 % (09/15 1845)   Recent Labs Lab 03/22/14 2015 03/23/14 0010 03/23/14 0413 03/25/14 0848 03/25/14 1251  GLUCAP 107* 116* 136* 106* 105*    Recent Labs Lab 03/20/14 1721 03/21/14 0516 03/24/14 1250 03/25/14 0400 03/26/14 0312  NA 143 141 139 140 140  K 4.5 4.0 4.1 4.4 3.7  CL 106 106 107 107 108  CO2 26 24 21 20 21   GLUCOSE 128* 139*  123* 122* 126*  BUN 20 17 17 17 16   CREATININE 0.98 0.65 0.52 0.50 0.46*  CALCIUM 8.9 8.4 8.1* 8.1* 7.6*   No results found for this basename: AST, ALT, ALKPHOS, BILITOT, PROT, ALBUMIN,  in the last 168 hours  Recent Labs Lab 03/20/14 1721 03/21/14 0516 03/24/14 1250 03/25/14 0400 03/26/14 0312  WBC 10.0 13.3* 8.9 11.8* 8.9  NEUTROABS 6.9  --   --   --   --   HGB 12.7 11.9* 10.7* 11.3* 10.2*  HCT 38.2 35.7* 31.6* 33.0* 30.5*  MCV 98.7 98.1 95.5 95.9 96.2  PLT 193 181 194 182 191   No results found for this basename: CKTOTAL, CKMB, CKMBINDEX, TROPONINI,  in the last 168 hours No results found for this basename: LABPROT, INR,  in the last 72 hours No results found for this basename: COLORURINE, APPERANCEUR, LABSPEC, PHURINE, GLUCOSEU, HGBUR, BILIRUBINUR, KETONESUR, PROTEINUR, UROBILINOGEN, NITRITE, LEUKOCYTESUR,  in the last 72 hours     Component Value Date/Time   CHOL 208* 03/23/2014 0425   TRIG 91 03/23/2014 0425   HDL 56 03/23/2014 0425   CHOLHDL 3.7 03/23/2014 0425   VLDL 18 03/23/2014 0425   LDLCALC 134* 03/23/2014 0425   Lab Results  Component Value Date   HGBA1C 6.1* 03/22/2014   No results found for this basename: labopia,  cocainscrnur,  labbenz,  amphetmu,  thcu,  labbarb    No results  found for this basename: ETH,  in the last 168 hours  Ct Angio Head and neck W/cm &/or Wo Cm  03/22/2014   IMPRESSION: Acute infarct in the right basal ganglia and right parietal white matter. There is decreased perfusion of a much larger area of the right temporal parietal lobe in the right MCA territory compatible with tissue at risk of further infarction.  CTA reveals high-grade stenosis versus occlusion distal right M1 segment with hypoperfusion of right MCA branches.  Carotid artery and vertebral artery widely patent in the neck bilaterally.   Ct Head Wo Contrast  03/22/2014   IMPRESSION: There is a decreased attenuation in the right parietal lobe/ distribution of the right middle  cerebral artery consistent with acute to subacute ischemia.   Venous doppler - Lower extremity venous duplex has been completed. Preliminary findings: no evidence of DVT  MRI brain - Acute right MCA infarct involving the deep white matter, external  capsule, and a small amount of the insular cortex. No associated  hemorrhage or midline shift.  Generalized atrophy with mild chronic microvascular ischemia.  2D echo - Left ventricle: The cavity size was normal. There was mild concentric hypertrophy. Systolic function was normal. The estimated ejection fraction was in the range of 55% to 60%. Wall motion was normal; there were no regional wall motion abnormalities. - Left atrium: The atrium was mildly dilated. - Right ventricle: The cavity size was mildly dilated. - Right atrium: The atrium was mildly dilated. - Tricuspid valve: There was moderate regurgitation directed centrally. - Pulmonary arteries: Systolic pressure was mildly increased. PA peak pressure: 38 mm Hg (S).   PHYSICAL EXAM  Temp:  [97.8 F (36.6 C)-99.2 F (37.3 C)] 97.8 F (36.6 C) (09/15 1845) Pulse Rate:  [73-142] 97 (09/15 1845) Resp:  [15-31] 18 (09/15 1845) BP: (89-141)/(42-71) 134/57 mmHg (09/15 1845) SpO2:  [89 %-100 %] 99 % (09/15 1845)  General - Well nourished, well developed, lethargic.  Ophthalmologic - not cooperative on exam.  Cardiovascular - irregularly irregular HR and rhythm.  Mental Status -  Mildly sleepy, but open eyes on voice, naming and repeating intact, follows simple commands and language fluent. Orientated to people and self and to time and place.  Cranial Nerves II - XII - II - visual field intact. III, IV, VI - right eye gaze preference, but able to cross midline. V - Facial sensation intact bilaterally. VII - left facial droop. VIII - Hearing & vestibular intact bilaterally. X - Palate elevates symmetrically. XI - Chin turning & shoulder shrug intact bilaterally. XII -  Tongue protrusion intact.  Motor Strength - LUE proximal 3-/5, distal 2/5, LLE not able to move proximally due to pain but able to wiggle toes at least 3+/5, RUE 5-/5 and RLE 4+/5.  Reflexes - The patient's reflexes were 1+ in all extremities and she had no pathological reflexes.  Sensory - Light touch, temperature/pinprick were assessed and were symmetric.    Coordination - right hand FTN intact.  Tremor was absent.  Gait and Station - not able to test.   ASSESSMENT/PLAN  Ms. LEVY WELLMAN is a 80 y.o. female with history of afib not on AC due to frequent falls, HTN, HLD, migraine was admitted to [facility] for left hip fracture s/p fall, prepared for surgery but developed AMS and left sided weakness yesterday. Imaging consistent with right MCA stroke likely due to afib not on AC.   Stroke - right MCA territory, embolic secondary to afib not on  AC     aspirin 81 mg orally every day prior to admission, now on aspirin 325 mg orally every day  MRI showed moderate sized right MCA stroke, consistent with embolic pattern  CTA head and neck showed right MCA stroke but no LVO  2D Echo EF 55-60%  LDL 134, added lipitor for stroke prevention and HLD  HgbA1c 6.1 WNL  SCDs and heparin subq for VTE prophylaxis  Cardiac diet with honey think liquid.   Bedrest for left hip fracture  Pt currently stable and agree to proceed with left hip surgery. Prefer spinal anesthesia and avoid hypotension.  Afib - chronic, not on AC in the past due to frequent fall - currently rate well controlled - cardiology on board, appreciate their help - continue metoprolol for rate control - will hold off AC for 10-14 days due to potential surgery and recent moderate sized stroke - ASA 325mg  for now.  Hypertension   Home meds:  norvasc and metoprolol. metoprolo was resumed in hospital with holding parameters.  BP tends to be low, will continue the fluids  Permissive hypertension <220/120 for 24-48  hours  Hyperlipidemia  Home meds: lovastatin 20mg   LDL 134   Started lipitor 40mg    LDL goal < 100   Other Stroke Risk Factors Advanced age   Obesity, Body mass index is 25.53 kg/(m^2).   Left hip fracture s/p surgery  ortho on board  Tolerated surgery well  On fentanyl and oxycodone prn for pain  Other Pertinent History  Migraine on topamax at home  Hospital day # 6  Rosalin Hawking, MD PhD Stroke Neurology 03/26/2014 7:51 PM   To contact Stroke Continuity provider, please refer to [email or web address] After hours, contact General Neurology

## 2014-03-26 NOTE — Progress Notes (Signed)
   Subjective:  Patient is resting comfortably in bed  Objective:   VITALS:   Filed Vitals:   03/26/14 0400 03/26/14 0404 03/26/14 0500 03/26/14 0600  BP: 129/59  101/47 89/43  Pulse: 99  91 80  Temp:  98.8 F (37.1 C)    TempSrc:  Oral    Resp: 16  15 18   Height:      Weight:      SpO2: 99%  94% 98%    Dressings c/d/i Drowsy this am +DF/PF ankle 2+ distal pulses   Lab Results  Component Value Date   WBC 8.9 03/26/2014   HGB 10.2* 03/26/2014   HCT 30.5* 03/26/2014   MCV 96.2 03/26/2014   PLT 191 03/26/2014     Assessment/Plan:  1 Day Post-Op   - Expected postop acute blood loss anemia - will monitor for symptoms - Up with PT/OT when able - DVT ppx - SCDs, ambulation, asa, subQ heparin per primary team - WBAT left lower extremity  Marianna Payment 03/26/2014, 7:48 AM [phone]

## 2014-03-26 NOTE — Progress Notes (Signed)
Speech Language Pathology Treatment: Dysphagia  Patient Details Name: Erin [name] MRN: [id number] DOB: Jan 23, 1930 Today's Date: 03/26/2014 Time: 5176-1607 SLP Time Calculation (min): 15 min  Assessment / Plan / Recommendation Clinical Impression  Pt s/p surgery 03/25/14.   Groggy, requires verbal/tactile cues to participate.  Family present.  Son hoping to give pt iced tea - we reviewed dysphagia and restricted diet as a result of the CVA.  Provided with honey-thick liquids with overall mod verbal/tactile cues to maintain head in neutral position.  Consumed POs with overall adequate toleration, no s/s of aspiration when fed carefully by clinician.  Recommend continuing current diet (dys 1, honey-thick liquids) until LOA improves.  Instrumental study may be required. SLP to follow for goals.   HPI HPI: 80 yo female h/o freq falls, cafib, parkinsons who came in after mechanical fall while in her kitchen, sustaining a displaced intertrochanteric fracture of the left hip.She lives with her son. She has atrial fibrillation without anticoagulation secondary to frequent falls and risk of bleeding. On 03/22/2014 RN noted that the pt was unable to swallow medications and a Code Stroke was initiated. She was later noted to have slurred speech, left facial droop, and an inability to move her left arm, and a right gaze preference. CT shows acute infarct in the right basal ganglia and right parietal white matter.   Pertinent Vitals Pain Assessment: No/denies pain  SLP Plan  Continue with current plan of care    Recommendations Diet recommendations: Dysphagia 1 (puree);Honey-thick liquid Liquids provided via: Teaspoon Medication Administration: Crushed with puree Supervision: Full supervision/cueing for compensatory strategies Compensations: Check for anterior loss;Multiple dry swallows after each bite/sip Postural Changes and/or Swallow Maneuvers: Seated upright 90 degrees              Oral Care  Recommendations: Oral care BID Plan: Continue with current plan of care    GO    Erin [name] Erin [name], [place] CCC/SLP Pager [phone]' Erin [name] 03/26/2014, 11:36 AM

## 2014-03-26 NOTE — Anesthesia Postprocedure Evaluation (Signed)
  Anesthesia Post-op Note  Patient: Erin [name]  Procedure(s) Performed: Procedure(s): INTRAMEDULLARY NAIL INTERTROCHANTRIC LEFT HIP (Left)  Patient Location: PACU  Anesthesia Type:Spinal  Level of Consciousness: awake, alert  and oriented  Airway and Oxygen Therapy: Patient Spontanous Breathing and Patient connected to nasal cannula oxygen  Post-op Pain: none  Post-op Assessment: Post-op Vital signs reviewed, Patient's Cardiovascular Status Stable and Respiratory Function Stable  Post-op Vital Signs: Reviewed and stable  Last Vitals:  Filed Vitals:   03/26/14 0700  BP: 103/50  Pulse:   Temp: 37.3 C  Resp: 30    Complications: No apparent anesthesia complications

## 2014-03-26 NOTE — Progress Notes (Signed)
Patient Name: Erin [name] Date of Encounter: 03/26/2014     Principal Problem:   Fracture, intertrochanteric, left femur Active Problems:   Essential hypertension, benign   DNR (do not resuscitate)   Chronic atrial fibrillation   Fall at home   Preoperative cardiovascular examination   Cerebral embolism with cerebral infarction    SUBJECTIVE  Want some PPI for acid reflux, however denies any significant discomfort. Continue to have L sided weakness. Family in the room as well.  CURRENT MEDS . antiseptic oral rinse  7 mL Mouth Rinse BID  . aspirin  325 mg Oral Daily  . atorvastatin  40 mg Oral q1800  .  ceFAZolin (ANCEF) IV  2 g Intravenous Q6H  . heparin subcutaneous  5,000 Units Subcutaneous Q12H  . metoprolol tartrate  25 mg Oral BID  . pantoprazole  40 mg Oral Daily  . senna-docusate  1 tablet Oral BID    OBJECTIVE  Filed Vitals:   03/26/14 0600 03/26/14 0700 03/26/14 0800 03/26/14 1052  BP: 89/43 103/50  113/55  Pulse: 80   91  Temp:  99.2 F (37.3 C)    TempSrc:  Oral    Resp: 18 25 22    Height:      Weight:      SpO2: 98%  99%     Intake/Output Summary (Last 24 hours) at 03/26/14 1121 Last data filed at 03/26/14 0800  Gross per 24 hour  Intake   2795 ml  Output    690 ml  Net   2105 ml   Filed Weights   03/20/14 1714 03/20/14 2107  Weight: 151 lb (68.493 kg) 158 lb 1.6 oz (71.714 kg)    PHYSICAL EXAM  General: Pleasant, NAD. Neuro: Alert and oriented X 3. Only able to move L upper arm, however not L hand, LLE also weaker than right side Psych: Normal affect. HEENT:  Normal  Neck: Supple without bruits or JVD. Lungs:  Resp regular and unlabored, anterior exam CTA. Heart: irregular no s3, s4, or murmurs. Abdomen: Soft, non-tender, non-distended, BS + x 4.  Extremities: No clubbing, cyanosis or edema. DP/PT/Radials 2+ and equal bilaterally.  Accessory Clinical Findings  CBC  Recent Labs  03/25/14 0400 03/26/14 0312  WBC 11.8* 8.9   HGB 11.3* 10.2*  HCT 33.0* 30.5*  MCV 95.9 96.2  PLT 182 383   Basic Metabolic Panel  Recent Labs  03/25/14 0400 03/26/14 0312  NA 140 140  K 4.4 3.7  CL 107 108  CO2 20 21  GLUCOSE 122* 126*  BUN 17 16  CREATININE 0.50 0.46*  CALCIUM 8.1* 7.6*    TELE A-fib with HR 90-100s, no significant ventricular ectopy    ECG  No recent EKG  Echocardiogram 03/24/2014  LV EF: 55% - 60%  ------------------------------------------------------------------- Indications: CVA 436.  ------------------------------------------------------------------- History: PMH: GERD, Left Femur Fracture, Cerebral Embolism with Cerebral Infarct. Atrial fibrillation. Stroke. Risk factors: Hypertension.  ------------------------------------------------------------------- Study Conclusions  - Left ventricle: The cavity size was normal. There was mild concentric hypertrophy. Systolic function was normal. The estimated ejection fraction was in the range of 55% to 60%. Wall motion was normal; there were no regional wall motion abnormalities. - Left atrium: The atrium was mildly dilated. - Right ventricle: The cavity size was mildly dilated. - Right atrium: The atrium was mildly dilated. - Tricuspid valve: There was moderate regurgitation directed centrally. - Pulmonary arteries: Systolic pressure was mildly increased. PA peak pressure: 38 mm Hg (S).  Radiology/Studies  Ct Angio Head W/cm &/or Wo Cm  03/22/2014   CLINICAL DATA:  Stroke. Left-sided weakness and slurred speech. Hip fracture  EXAM: CT ANGIOGRAPHY HEAD AND NECK  CT PERFUSION HEAD  TECHNIQUE: Multidetector CT imaging of the head and neck was performed using the standard protocol during bolus administration of intravenous contrast. Multiplanar CT image reconstructions and MIPs were obtained to evaluate the vascular anatomy. Carotid stenosis measurements (when applicable) are obtained utilizing NASCET criteria, using the distal  internal carotid diameter as the denominator.  CONTRAST:  124mL OMNIPAQUE IOHEXOL 350 MG/ML SOLN  COMPARISON:  CT head 03/22/2014  FINDINGS: CTA HEAD FINDINGS  Hypodensity in the right basal ganglia involving the putamen consistent with acute infarct. This extends into the right parietal lobe. Negative for intracranial hemorrhage or mass.  Both vertebral arteries are patent to the basilar. Atherosclerotic calcification and mild stenosis distal right vertebral artery. PICA patent bilaterally. Left AICA patent. Basilar patent. Superior cerebellar and posterior cerebral arteries patent bilaterally.  Right cavernous carotid is widely patent. High-grade stenosis versus occlusion of the distal right M1 segment. Hypoperfusion of the right MCA branches which are patent and perfused. Right anterior cerebral artery patent without stenosis  Left cavernous carotid widely patent. Left anterior and middle cerebral arteries are patent without stenosis.  Negative for cerebral aneurysm.  Review of the MIP images confirms the above findings.  CTA NECK FINDINGS  Proximal great vessels are patent without significant stenosis. Right upper lobe density most likely scarring.  Carotid artery is widely patent bilaterally without significant stenosis. Mild atherosclerotic disease involving the carotid bulb on the right.  Both vertebral arteries widely patent without stenosis.  CT profusion findings  Acute infarct involving the right putamen and deep white matter on the right. There is a much larger area of penumbra with decreased time to peak perfusion and mean transit time.  Review of the MIP images confirms the above findings.  IMPRESSION: Acute infarct in the right basal ganglia and right parietal white matter. There is decreased perfusion of a much larger area of the right temporal parietal lobe in the right MCA territory compatible with tissue at risk of further infarction.  CTA reveals high-grade stenosis versus occlusion distal right  M1 segment with hypoperfusion of right MCA branches.  Carotid artery and vertebral artery widely patent in the neck bilaterally.  I reviewed the images with Dr. Estanislado Pandy   Electronically Signed   By: Franchot Gallo M.D.   On: 03/22/2014 18:24   Dg Chest 1 View  03/20/2014   CLINICAL DATA:  [place].  Left hip pain.  History of hypertension.  EXAM: CHEST - 1 VIEW  COMPARISON:  06/05/2013  FINDINGS: The heart is mildly enlarged but stable. There is tortuosity and calcification of the thoracic aorta. The lungs demonstrate chronic emphysematous changes and pulmonary scarring. No definite acute overlying pulmonary process. No pleural effusion or pneumothorax. The bony thorax is intact.  IMPRESSION: Chronic lung changes but no acute pulmonary findings.   Electronically Signed   By: Kalman Jewels M.D.   On: 03/20/2014 18:41   Dg Hip Complete Left  03/20/2014   CLINICAL DATA:  [place].  Left hip pain.  EXAM: LEFT HIP - COMPLETE 2+ VIEW  COMPARISON:  None.  FINDINGS: There is a displaced intertrochanteric fracture of the left hip with a varus deformity. The right hip is intact. The pubic symphysis and SI joints are intact. The pubic rami are intact.  IMPRESSION: Displaced intertrochanteric fracture of the left  hip.   Electronically Signed   By: Kalman Jewels M.D.   On: 03/20/2014 18:26   Dg Femur Left  03/25/2014   CLINICAL DATA:  Left hip fracture fixation.  EXAM: LEFT FEMUR - 2 VIEW; DG C-ARM 61-120 MIN  COMPARISON:  03/20/2014  FINDINGS: There is a gamma nail in the femur with 2 proximal dynamic hip screws transfixing the comminuted intertrochanteric fracture. No complicating features.  IMPRESSION: Internal fixation of intertrochanteric fracture with anatomic reduction   Electronically Signed   By: Kalman Jewels M.D.   On: 03/25/2014 16:28   Ct Head Wo Contrast  03/22/2014   CLINICAL DATA:  Difficulty swallowing and left facial drooping  EXAM: CT HEAD WITHOUT CONTRAST  TECHNIQUE: Contiguous axial images were  obtained from the base of the skull through the vertex without intravenous contrast.  COMPARISON:  11/18/2011  FINDINGS: Bony calvarium is intact. No gross soft tissue abnormality is noted. There are patchy areas of decreased attenuation identified within the right parietal lobe and deep white matter adjacent to the right lateral ventricle consistent with acute ischemia. These were not seen on prior MRI examination. No findings to suggest acute hemorrhage or space-occupying mass lesion are noted.  IMPRESSION: There is a decreased attenuation in the right parietal lobe/ distribution of the right middle cerebral artery consistent with acute to subacute ischemia.  These results were called by telephone at the time of interpretation on 03/22/2014 at 12:03 pm to Dr. Barton Dubois , who verbally acknowledged these results.   Electronically Signed   By: Inez Catalina M.D.   On: 03/22/2014 12:03   Ct Angio Neck W/cm &/or Wo/cm  03/22/2014   CLINICAL DATA:  Stroke. Left-sided weakness and slurred speech. Hip fracture  EXAM: CT ANGIOGRAPHY HEAD AND NECK  CT PERFUSION HEAD  TECHNIQUE: Multidetector CT imaging of the head and neck was performed using the standard protocol during bolus administration of intravenous contrast. Multiplanar CT image reconstructions and MIPs were obtained to evaluate the vascular anatomy. Carotid stenosis measurements (when applicable) are obtained utilizing NASCET criteria, using the distal internal carotid diameter as the denominator.  CONTRAST:  119mL OMNIPAQUE IOHEXOL 350 MG/ML SOLN  COMPARISON:  CT head 03/22/2014  FINDINGS: CTA HEAD FINDINGS  Hypodensity in the right basal ganglia involving the putamen consistent with acute infarct. This extends into the right parietal lobe. Negative for intracranial hemorrhage or mass.  Both vertebral arteries are patent to the basilar. Atherosclerotic calcification and mild stenosis distal right vertebral artery. PICA patent bilaterally. Left AICA patent.  Basilar patent. Superior cerebellar and posterior cerebral arteries patent bilaterally.  Right cavernous carotid is widely patent. High-grade stenosis versus occlusion of the distal right M1 segment. Hypoperfusion of the right MCA branches which are patent and perfused. Right anterior cerebral artery patent without stenosis  Left cavernous carotid widely patent. Left anterior and middle cerebral arteries are patent without stenosis.  Negative for cerebral aneurysm.  Review of the MIP images confirms the above findings.  CTA NECK FINDINGS  Proximal great vessels are patent without significant stenosis. Right upper lobe density most likely scarring.  Carotid artery is widely patent bilaterally without significant stenosis. Mild atherosclerotic disease involving the carotid bulb on the right.  Both vertebral arteries widely patent without stenosis.  CT profusion findings  Acute infarct involving the right putamen and deep white matter on the right. There is a much larger area of penumbra with decreased time to peak perfusion and mean transit time.  Review of the MIP  images confirms the above findings.  IMPRESSION: Acute infarct in the right basal ganglia and right parietal white matter. There is decreased perfusion of a much larger area of the right temporal parietal lobe in the right MCA territory compatible with tissue at risk of further infarction.  CTA reveals high-grade stenosis versus occlusion distal right M1 segment with hypoperfusion of right MCA branches.  Carotid artery and vertebral artery widely patent in the neck bilaterally.  I reviewed the images with Dr. Estanislado Pandy   Electronically Signed   By: Franchot Gallo M.D.   On: 03/22/2014 18:24   Mr Brain Wo Contrast  03/23/2014   CLINICAL DATA:  Stroke  EXAM: MRI HEAD WITHOUT CONTRAST  TECHNIQUE: Multiplanar, multiecho pulse sequences of the brain and surrounding structures were obtained without intravenous contrast.  COMPARISON:  CT perfusion and CTA  03/22/2014  FINDINGS: Acute right MCA infarct. Acute infarct in the deep white matter on the right with a small amount of extension into the insular cortex. Acute infarct in the external capsule on the right and in the periventricular white matter. No other acute infarct.  Ventricle size is normal. Negative for intracranial hemorrhage. Negative for mass. No shift of the midline structures.  Generalized atrophy with ventricular enlargement. Mild chronic microvascular ischemia.  IMPRESSION: Acute right MCA infarct involving the deep white matter, external capsule, and a small amount of the insular cortex. No associated hemorrhage or midline shift.  Generalized atrophy with mild chronic microvascular ischemia.   Electronically Signed   By: Franchot Gallo M.D.   On: 03/23/2014 17:56   Dg Epidurography  03/06/2014   CLINICAL DATA:  Lumbago. Left lower extremity radiculitis. Displacement of the L4-5 and L5-S1 lumbar discs. Previous right lower extremity radicular symptoms have resolved. The patient had significant relief from her previous injection. Her back pain has returned.  FLUOROSCOPY TIME:  28 seconds  1.73 micro Gy cm squared/second  PROCEDURE: The procedure, risks, benefits, and alternatives were explained to the patient. Questions regarding the procedure were encouraged and answered. The patient understands and consents to the procedure.  LUMBAR EPIDURAL INJECTION:  An interlaminar approach was performed on left at L4-5. The overlying skin was cleansed and anesthetized. A 20 gauge Crawford epidural needle was advanced using loss-of-resistance technique.  DIAGNOSTIC EPIDURAL INJECTION:  Injection of Omnipaque 180 shows a good epidural pattern with spread above and below the level of needle placement, primarily on the left no vascular opacification is seen.  THERAPEUTIC EPIDURAL INJECTION:  120 Mg of Depo-Medrol mixed with 3 mL 1% lidocaine were instilled. The procedure was well-tolerated, and the patient was  discharged thirty minutes following the injection in good condition.  COMPLICATIONS: None  IMPRESSION: Technically successful epidural injection on the left L4-5 # 2   Electronically Signed   By: Lawrence Santiago M.D.   On: 03/06/2014 14:12   Ct Cerebral Perfusion W/cm  03/22/2014   CLINICAL DATA:  Stroke. Left-sided weakness and slurred speech. Hip fracture  EXAM: CT ANGIOGRAPHY HEAD AND NECK  CT PERFUSION HEAD  TECHNIQUE: Multidetector CT imaging of the head and neck was performed using the standard protocol during bolus administration of intravenous contrast. Multiplanar CT image reconstructions and MIPs were obtained to evaluate the vascular anatomy. Carotid stenosis measurements (when applicable) are obtained utilizing NASCET criteria, using the distal internal carotid diameter as the denominator.  CONTRAST:  18mL OMNIPAQUE IOHEXOL 350 MG/ML SOLN  COMPARISON:  CT head 03/22/2014  FINDINGS: CTA HEAD FINDINGS  Hypodensity in the right basal  ganglia involving the putamen consistent with acute infarct. This extends into the right parietal lobe. Negative for intracranial hemorrhage or mass.  Both vertebral arteries are patent to the basilar. Atherosclerotic calcification and mild stenosis distal right vertebral artery. PICA patent bilaterally. Left AICA patent. Basilar patent. Superior cerebellar and posterior cerebral arteries patent bilaterally.  Right cavernous carotid is widely patent. High-grade stenosis versus occlusion of the distal right M1 segment. Hypoperfusion of the right MCA branches which are patent and perfused. Right anterior cerebral artery patent without stenosis  Left cavernous carotid widely patent. Left anterior and middle cerebral arteries are patent without stenosis.  Negative for cerebral aneurysm.  Review of the MIP images confirms the above findings.  CTA NECK FINDINGS  Proximal great vessels are patent without significant stenosis. Right upper lobe density most likely scarring.  Carotid  artery is widely patent bilaterally without significant stenosis. Mild atherosclerotic disease involving the carotid bulb on the right.  Both vertebral arteries widely patent without stenosis.  CT profusion findings  Acute infarct involving the right putamen and deep white matter on the right. There is a much larger area of penumbra with decreased time to peak perfusion and mean transit time.  Review of the MIP images confirms the above findings.  IMPRESSION: Acute infarct in the right basal ganglia and right parietal white matter. There is decreased perfusion of a much larger area of the right temporal parietal lobe in the right MCA territory compatible with tissue at risk of further infarction.  CTA reveals high-grade stenosis versus occlusion distal right M1 segment with hypoperfusion of right MCA branches.  Carotid artery and vertebral artery widely patent in the neck bilaterally.  I reviewed the images with Dr. Estanislado Pandy   Electronically Signed   By: Franchot Gallo M.D.   On: 03/22/2014 18:24   Dg Chest Port 1 View  03/23/2014   CLINICAL DATA:  Preop.  Cough  EXAM: PORTABLE CHEST - 1 VIEW  COMPARISON:  03/20/2014  FINDINGS: The heart size is normal. There is calcified plaque noted within the aortic knob. Lung volumes are low. Postsurgical changes noted within the right upper lobe. There is no pleural or pericardial effusion. No airspace consolidation.  IMPRESSION: 1. No acute cardiopulmonary abnormalities. 2. Hypoinflation 3. Atherosclerotic disease.   Electronically Signed   By: Kerby Moors M.D.   On: 03/23/2014 15:10   Dg Knee Left Port  03/23/2014   CLINICAL DATA:  Evaluate for fracture.  EXAM: PORTABLE LEFT KNEE - 1-2 VIEW  COMPARISON:  No priors.  FINDINGS: Two views of the left knee demonstrate no acute displaced fracture, subluxation or dislocation. Study is limited by nonstandard views. Mild joint space narrowing, subchondral sclerosis and osteophyte formation, compatible with mild  osteoarthritis.  IMPRESSION: 1. No acute radiographic abnormality of the left knee.   Electronically Signed   By: Vinnie Langton M.D.   On: 03/23/2014 15:08   Dg C-arm 1-60 Min  03/25/2014   CLINICAL DATA:  Left hip fracture fixation.  EXAM: LEFT FEMUR - 2 VIEW; DG C-ARM 61-120 MIN  COMPARISON:  03/20/2014  FINDINGS: There is a gamma nail in the femur with 2 proximal dynamic hip screws transfixing the comminuted intertrochanteric fracture. No complicating features.  IMPRESSION: Internal fixation of intertrochanteric fracture with anatomic reduction   Electronically Signed   By: Kalman Jewels M.D.   On: 03/25/2014 16:28    ASSESSMENT AND PLAN  1. S/p L intertrochanteric fx of L hip  - s/p intramedullary fixation 03/25/2014   -  per neurology on 9/14, continue ASA 325 mg for now, will hold off AC for 10-14 days due to recent moderate sized stroke   - ?if risk of recurrent stroke vs risk of bleeding in this patient, may need family discussion regarding long term anticoagulation depend on neurological prognosis. Briefly discussed with family today.  2. Acute R MCA stroke: per neuro, likely embolic from chronic AF  3. Permanent atrial fibrillation  - CHADS-Vasc score 4, not a anticoagulation candidate due to frequent falls  - per Dr. Debara Pickett, systemic anticoagulation stopped a yr ago due to frequent fall  4. HTN   Signed, Almyra Deforest PA-C Pager: [phone]  Personally seen and examined. Agree with above. Please see Dr. Debara Pickett note from yesterday.   Candee Furbish, MD

## 2014-03-26 NOTE — Progress Notes (Signed)
OT Cancellation Note  Patient Details Name: Erin [name] MRN: [id number] DOB: 13-Nov-1929   Cancelled Treatment:    Reason Eval/Treat Not Completed: Patient at procedure or test/ unavailable  [place], OTR/L  [phone] 03/26/2014 03/26/2014, 4:37 PM

## 2014-03-26 NOTE — Evaluation (Signed)
Physical Therapy Evaluation Patient Details Name: Erin [name] MRN: [id number] DOB: 1930-02-12 Today's Date: 03/26/2014   History of Present Illness  80 yo female h/o freq falls, cafib, parkinsons who came in after mechanical fall while in her kitchen, sustaining a displaced intertrochanteric fracture of the left hip.She lives with her son. She has atrial fibrillation without anticoagulation secondary to frequent falls and risk of bleeding. On 03/22/2014 RN noted that the pt was unable to swallow medications and a Code Stroke was initiated. She was later noted to have slurred speech, left facial droop, and an inability to move her left arm, and a right gaze preference. CT shows acute infarct in the right basal ganglia and right parietal white matter. Pt underwent ORIF of lt hip on 03/25/14.  Clinical Impression  Pt admitted with above. Pt currently with functional limitations due to the deficits listed below (see PT Problem List).  Pt will benefit from skilled PT to increase their independence and safety with mobility to allow discharge to the venue listed below. Expect progress will be very slow due to multiple medical issues.      Follow Up Recommendations SNF    Equipment Recommendations  Wheelchair (measurements PT);Wheelchair cushion (measurements PT)    Recommendations for Other Services       Precautions / Restrictions Precautions Precautions: Fall Restrictions Weight Bearing Restrictions: Yes LLE Weight Bearing: Weight bearing as tolerated      Mobility  Bed Mobility Overal bed mobility: Needs Assistance Bed Mobility: Supine to Sit;Sit to Supine     Supine to sit: +2 for physical assistance;Total assist;HOB elevated Sit to supine: +2 for physical assistance;Total assist   General bed mobility comments: Assist to move legs off bed and to elevate trunk into sitting. Assist to lower trunk and bring legs back onto bed to return to supine.  Transfers                     Ambulation/Gait                Stairs            Wheelchair Mobility    Modified Rankin (Stroke Patients Only) Modified Rankin (Stroke Patients Only) Pre-Morbid Rankin Score: Moderate disability Modified Rankin: Severe disability     Balance Overall balance assessment: Needs assistance Sitting-balance support: Bilateral upper extremity supported;Feet supported Sitting balance-Leahy Scale: Poor Sitting balance - Comments: Pt sat EOB x 8 minutes with mod to max assist. Postural control: Posterior lean;Right lateral lean                                   Pertinent Vitals/Pain Pain Assessment: Faces Faces Pain Scale: Hurts even more (with mobility) Pain Location: lt hip Pain Descriptors / Indicators: Aching Pain Intervention(s): Limited activity within patient's tolerance;Repositioned    Home Living Family/patient expects to be discharged to:: Skilled nursing facility Living Arrangements: Children Available Help at Discharge: Family;Available PRN/intermittently Type of Home: House Home Access: Level entry     Home Layout: One level Home Equipment: Walker - 2 wheels;Cane - single point;Bedside commode      Prior Function Level of Independence: Needs assistance   Gait / Transfers Assistance Needed: Pt amb with walker. History of falls.           Hand Dominance   Dominant Hand: Right    Extremity/Trunk Assessment   Upper Extremity Assessment: Defer to OT evaluation  Lower Extremity Assessment: Generalized weakness;LLE deficits/detail   LLE Deficits / Details: Pt able to move against gravity with assist.     Communication   Communication: Other (comment) (slurred speech)  Cognition Arousal/Alertness: Awake/alert Behavior During Therapy: WFL for tasks assessed/performed Overall Cognitive Status: Within Functional Limits for tasks assessed                      General Comments      Exercises         Assessment/Plan    PT Assessment Patient needs continued PT services  PT Diagnosis Difficulty walking;Generalized weakness;Hemiplegia non-dominant side   PT Problem List Decreased strength;Decreased activity tolerance;Decreased balance;Decreased mobility;Decreased knowledge of use of DME;Decreased knowledge of precautions  PT Treatment Interventions DME instruction;Functional mobility training;Therapeutic activities;Therapeutic exercise;Balance training;Patient/family education;Neuromuscular re-education   PT Goals (Current goals can be found in the Care Plan section) Acute Rehab PT Goals Patient Stated Goal: To get better PT Goal Formulation: With patient Time For Goal Achievement: 04/09/14 Potential to Achieve Goals: Fair    Frequency Min 3X/week   Barriers to discharge        Co-evaluation               End of Session Equipment Utilized During Treatment: Oxygen Activity Tolerance: Patient limited by fatigue Patient left: in bed;with call bell/phone within reach           Time: 1215-1229 PT Time Calculation (min): 14 min   Charges:   PT Evaluation $Initial PT Evaluation Tier I: 1 Procedure PT Treatments $Therapeutic Activity: 8-22 mins   PT G Codes:          Madysin Crisp 2014/04/03, 1:51 PM  [employer] PT [phone]

## 2014-03-27 ENCOUNTER — Telehealth: Payer: Self-pay | Admitting: Internal Medicine

## 2014-03-27 LAB — BASIC METABOLIC PANEL
ANION GAP: 9 (ref 5–15)
BUN: 18 mg/dL (ref 6–23)
CO2: 23 mEq/L (ref 19–32)
Calcium: 7.9 mg/dL — ABNORMAL LOW (ref 8.4–10.5)
Chloride: 108 mEq/L (ref 96–112)
Creatinine, Ser: 0.46 mg/dL — ABNORMAL LOW (ref 0.50–1.10)
GFR calc non Af Amer: >90 mL/min (ref >90)
Glucose, Bld: 119 mg/dL — ABNORMAL HIGH (ref 70–99)
POTASSIUM: 3.8 meq/L (ref 3.7–5.3)
SODIUM: 140 meq/L (ref 137–147)

## 2014-03-27 LAB — CBC
HCT: 28.2 % — ABNORMAL LOW (ref 36.0–46.0)
HEMOGLOBIN: 9.4 g/dL — AB (ref 12.0–15.0)
MCH: 32.4 pg (ref 26.0–34.0)
MCHC: 33.3 g/dL (ref 30.0–36.0)
MCV: 97.2 fL (ref 78.0–100.0)
PLATELETS: 218 10*3/uL (ref 150–400)
RBC: 2.9 MIL/uL — ABNORMAL LOW (ref 3.87–5.11)
RDW: 13.4 % (ref 11.5–15.5)
WBC: 8.6 10*3/uL (ref 4.0–10.5)

## 2014-03-27 MED ORDER — METOPROLOL TARTRATE 25 MG PO TABS
25.0000 mg | ORAL_TABLET | Freq: Three times a day (TID) | ORAL | Status: DC
  Administered 2014-03-27 – 2014-03-28 (×3): 25 mg via ORAL
  Filled 2014-03-27: qty 1
  Filled 2014-03-27: qty 2
  Filled 2014-03-27: qty 1

## 2014-03-27 MED ORDER — WHITE PETROLATUM GEL
Status: AC
  Administered 2014-03-27: 0.2
  Filled 2014-03-27: qty 5

## 2014-03-27 NOTE — Progress Notes (Signed)
STROKE TEAM PROGRESS NOTE   HISTORY Erin [name] is an 80 y.o. female who lives at home and had a fall resulting in left hip fracture on 9/9. She was brought to [facility]. She was noted to be confused at that time but today staff noted she had a left sided deficit. Code stroke was not called due to CT findings showing > 1/3 right MCA infarct. Patient was transferred to [facility] hospital for further evaluation and possible endovascular intervention. On arrival patient was alert, able to stated name and follow minimal commands. She showed a left sided paresis and right gaze preference. IR was called and they recommended CT perfusion to evaluate for mismatch.  Date last known well: Date: 03/21/2014  Time last known well: Unable to determine there was a change noted at 10:30 this AM but it was also noted she was more confused yesterday.  tPA Given: No: out of the window  She was admitted to the neuro ICU for further evaluation and treatment.  SUBJECTIVE (INTERVAL HISTORY) Son was at the bedside during rounds. Overnight no acute issues. She was working with PT/OT stated that she can not bear weight at left leg. PT/OT recommend inpt rehab.  OBJECTIVE Temp:  [97.3 F (36.3 C)-98.6 F (37 C)] 97.3 F (36.3 C) (09/16 2053) Pulse Rate:  [68-127] 99 (09/16 2053) Cardiac Rhythm:  [-] Atrial fibrillation (09/16 2021) Resp:  [18] 18 (09/16 2053) BP: (103-131)/(40-72) 131/55 mmHg (09/16 2053) SpO2:  [96 %-98 %] 97 % (09/16 2053)   Recent Labs Lab 03/22/14 2015 03/23/14 0010 03/23/14 0413 03/25/14 0848 03/25/14 1251  GLUCAP 107* 116* 136* 106* 105*    Recent Labs Lab 03/21/14 0516 03/24/14 1250 03/25/14 0400 03/26/14 0312 03/27/14 0615  NA 141 139 140 140 140  K 4.0 4.1 4.4 3.7 3.8  CL 106 107 107 108 108  CO2 24 21 20 21 23   GLUCOSE 139* 123* 122* 126* 119*  BUN 17 17 17 16 18   CREATININE 0.65 0.52 0.50 0.46* 0.46*  CALCIUM 8.4 8.1* 8.1* 7.6* 7.9*   No results found for this  basename: AST, ALT, ALKPHOS, BILITOT, PROT, ALBUMIN,  in the last 168 hours  Recent Labs Lab 03/21/14 0516 03/24/14 1250 03/25/14 0400 03/26/14 0312 03/27/14 0615  WBC 13.3* 8.9 11.8* 8.9 8.6  HGB 11.9* 10.7* 11.3* 10.2* 9.4*  HCT 35.7* 31.6* 33.0* 30.5* 28.2*  MCV 98.1 95.5 95.9 96.2 97.2  PLT 181 194 182 191 218   No results found for this basename: CKTOTAL, CKMB, CKMBINDEX, TROPONINI,  in the last 168 hours No results found for this basename: LABPROT, INR,  in the last 72 hours No results found for this basename: COLORURINE, APPERANCEUR, LABSPEC, PHURINE, GLUCOSEU, HGBUR, BILIRUBINUR, KETONESUR, PROTEINUR, UROBILINOGEN, NITRITE, LEUKOCYTESUR,  in the last 72 hours     Component Value Date/Time   CHOL 208* 03/23/2014 0425   TRIG 91 03/23/2014 0425   HDL 56 03/23/2014 0425   CHOLHDL 3.7 03/23/2014 0425   VLDL 18 03/23/2014 0425   LDLCALC 134* 03/23/2014 0425   Lab Results  Component Value Date   HGBA1C 6.1* 03/22/2014   No results found for this basename: labopia,  cocainscrnur,  labbenz,  amphetmu,  thcu,  labbarb    No results found for this basename: ETH,  in the last 168 hours  Ct Angio Head and neck W/cm &/or Wo Cm  03/22/2014   IMPRESSION: Acute infarct in the right basal ganglia and right parietal white matter. There is  decreased perfusion of a much larger area of the right temporal parietal lobe in the right MCA territory compatible with tissue at risk of further infarction.  CTA reveals high-grade stenosis versus occlusion distal right M1 segment with hypoperfusion of right MCA branches.  Carotid artery and vertebral artery widely patent in the neck bilaterally.   Ct Head Wo Contrast  03/22/2014   IMPRESSION: There is a decreased attenuation in the right parietal lobe/ distribution of the right middle cerebral artery consistent with acute to subacute ischemia.   Venous doppler - Lower extremity venous duplex has been completed. Preliminary findings: no evidence of  DVT  MRI brain - Acute right MCA infarct involving the deep white matter, external  capsule, and a small amount of the insular cortex. No associated  hemorrhage or midline shift.  Generalized atrophy with mild chronic microvascular ischemia.  2D echo - Left ventricle: The cavity size was normal. There was mild concentric hypertrophy. Systolic function was normal. The estimated ejection fraction was in the range of 55% to 60%. Wall motion was normal; there were no regional wall motion abnormalities. - Left atrium: The atrium was mildly dilated. - Right ventricle: The cavity size was mildly dilated. - Right atrium: The atrium was mildly dilated. - Tricuspid valve: There was moderate regurgitation directed centrally. - Pulmonary arteries: Systolic pressure was mildly increased. PA peak pressure: 38 mm Hg (S).   PHYSICAL EXAM  Temp:  [97.3 F (36.3 C)-98.6 F (37 C)] 97.3 F (36.3 C) (09/16 2053) Pulse Rate:  [68-127] 99 (09/16 2053) Resp:  [18] 18 (09/16 2053) BP: (103-131)/(40-72) 131/55 mmHg (09/16 2053) SpO2:  [96 %-98 %] 97 % (09/16 2053)  General - Well nourished, well developed, lethargic.  Ophthalmologic - not cooperative on exam.  Cardiovascular - irregularly irregular HR and rhythm.  Mental Status -  Mildly sleepy, but open eyes on voice, naming and repeating intact, follows simple commands and language fluent. Orientated to people and self and to time and place.  Cranial Nerves II - XII - II - visual field intact. III, IV, VI - right eye gaze preference, but able to cross midline. V - Facial sensation intact bilaterally. VII - left facial droop. VIII - Hearing & vestibular intact bilaterally. X - Palate elevates symmetrically. XI - Chin turning & shoulder shrug intact bilaterally. XII - Tongue protrusion intact.  Motor Strength - LUE proximal 3-/5, distal 2/5, LLE not able to move proximally due to pain but able to wiggle toes at least 3+/5, RUE 5-/5 and RLE  4+/5.  Reflexes - The patient's reflexes were 1+ in all extremities and she had no pathological reflexes.  Sensory - Light touch, temperature/pinprick were assessed and were symmetric.    Coordination - right hand FTN intact.  Tremor was absent.  Gait and Station - not able to test.   ASSESSMENT/PLAN  Ms. Erin [name] is a 80 y.o. female with history of afib not on AC due to frequent falls, HTN, HLD, migraine was admitted to [facility] for left hip fracture s/p fall, prepared for surgery but developed AMS and left sided weakness yesterday. Imaging consistent with right MCA stroke likely due to afib not on AC.   Stroke - right MCA territory, embolic secondary to afib not on [facility]     aspirin 81 mg orally every day prior to admission, now on aspirin 325 mg orally every day  MRI showed moderate sized right MCA stroke, consistent with embolic pattern  CTA head and neck showed  right MCA stroke but no LVO  2D Echo EF 55-60%  LDL 134, added lipitor for stroke prevention and HLD  HgbA1c 6.1 WNL  SCDs and heparin subq for VTE prophylaxis  Cardiac diet with honey think liquid.   PT/OT recommend CIR.  Afib - chronic, not on AC in the past due to frequent fall - currently rate well controlled - cardiology on board, appreciate their help - continue metoprolol for rate control - will consider to start anticoagulation with eliquis 5mg  bid around 10-14 days after stroke - ASA 325mg  for now.  Anemia  - H&H dropping today 9.4 - monitor daily - iron study  Hypertension   Home meds:  norvasc and metoprolol. metoprolo was resumed in hospital with holding parameters.  BP tends to be low, will continue the fluids  BP goal normotensive  Hyperlipidemia  Home meds: lovastatin 20mg   LDL 134   Started lipitor 40mg    LDL goal < 100   Other Stroke Risk Factors Advanced age   Obesity, Body mass index is 25.53 kg/(m^2).   Left hip fracture s/p surgery  ortho on board  Tolerated  surgery well  On fentanyl and oxycodone prn for pain  Other Pertinent History  Migraine on topamax at home  Hospital day # 7  Rosalin Hawking, MD PhD Stroke Neurology 03/27/2014 11:00 PM   To contact Stroke Continuity provider, please refer to [email or web address] After hours, contact General Neurology

## 2014-03-27 NOTE — Progress Notes (Signed)
    Subjective:  Awake and alert. Worked with PT this am. Asking appropriate questions about her care, prognosis.  Objective:  Vital Signs in the last 24 hours: Temp:  [97.8 F (36.6 C)-98.4 F (36.9 C)] 98.1 F (36.7 C) (09/16 0607) Pulse Rate:  [68-142] 68 (09/16 0607) Resp:  [15-23] 18 (09/16 0607) BP: (102-134)/(50-72) 127/72 mmHg (09/16 0607) SpO2:  [96 %-100 %] 97 % (09/16 0607)  Intake/Output from previous day:  Intake/Output Summary (Last 24 hours) at 03/27/14 1203 Last data filed at 03/27/14 0900  Gross per 24 hour  Intake    730 ml  Output    500 ml  Net    230 ml    Physical Exam: General appearance: alert, cooperative and no distress Lungs: decreased breath sounds Heart: irregularly irregular rhythm   Rate: 65-120  Rhythm: atrial fibrillation  Lab Results:  Recent Labs  03/26/14 0312 03/27/14 0615  WBC 8.9 8.6  HGB 10.2* 9.4*  PLT 191 218    Recent Labs  03/26/14 0312 03/27/14 0615  NA 140 140  K 3.7 3.8  CL 108 108  CO2 21 23  GLUCOSE 126* 119*  BUN 16 18  CREATININE 0.46* 0.46*   No results found for this basename: TROPONINI, CK, MB,  in the last 72 hours No results found for this basename: INR,  in the last 72 hours  Imaging: Imaging results have been reviewed  Cardiac Studies:  Assessment/Plan:  80 y.o.female patient, followed by Dr. Debara Pickett, with a history of chronic atrial fib. She is not a anticoagulation candidate due to frequent falls. Other problems include hypertension, hyperlipidemia, and Parkinson's Disease. She presented to [facility] ER 03/20/14 after mechanical fall while in her kitchen. She  sustained a displaced intertrochanteric fracture of the left hip. She has had a Myoview in 2014 that was negative for ischemia. She was admitted to [facility] and cleared for hip surgery but the suffered an acute Rt brain CVA. She was transferred to [facility] but not felt to be a candidate for TPA or intervention. AF rate has been controlled. She underwent Lt  hip repair 03/25/14.     Principal Problem:   Fracture, intertrochanteric, left femur Active Problems:   DNR (do not resuscitate)   Fall at home   Cerebral embolism with cerebral infarction   Chronic atrial fibrillation   Preoperative cardiovascular examination   Essential hypertension, benign    PLAN: It's encouraging that she has had some recovery of function since her stroke. Her HR has been running a little high, not unexpected. Will increase Lopressor to 25 mg TID.  We will arrange follow up with Dr Debara Pickett in a few weeks. We will see again prn. I spoke with pt and family.   Kerin Ransom PA-C Beeper [phone] 03/27/2014, 12:03 PM   Personally seen and examined. Agree with above. Candee Furbish, MD

## 2014-03-27 NOTE — Consult Note (Signed)
Physical Medicine and Rehabilitation Consult Reason for Consult: Left intertrochanteric hip fracture/Parkinson disease Referring Physician: Dr.Xu   HPI: Erin [name] is a 80 y.o. right-handed female with history of question Parkinson's disease followed by Dr. Jannifer Franklin, frequent falls, atrial fibrillation on no anti-coagulation except aspirin. Patient lives with family use and occasional cane walker when needed and was still driving. Presented 03/20/2014 after mechanical fall when she lost her balance landing on her left hip without loss of consciousness and was admitted to an [facility]. X-rays and imaging revealed a left intertrochanteric hip fracture. Patient noted to be confused with some left-sided weakness. Cranial CT scan showed right parietal lobe distribution of the right middle cerebral artery consistent with acute to subacute ischemia. CTA head and neck consistent with acute infarct right basal ganglia right parietal white matter. High-grade stenosis versus occlusion of distal right M1 segment. Neurology consulted. Patient did not receive TPA. Echocardiogram with ejection fraction of 60% no wall motion abnormalities. Venous Doppler studies lower extremities negative for DVT. Preoperative clearance per cardiology services underwent ORIF of intertrochanteric hip fracture 03/25/2013 per Dr.Xu. Weightbearing as tolerated left lower extremity. Placed on subcutaneous heparin for DVT prophylaxis. Aspirin EC 80 for CVA prophylaxis. Dysphagia 1 honey thick liquid diet. Occupational therapy evaluation completed 03/27/2014 with recommendations for physical medicine rehabilitation consult.   Review of Systems  HENT: Positive for hearing loss.   Cardiovascular: Positive for palpitations.  Gastrointestinal:       GERD  Musculoskeletal: Positive for falls.  Neurological: Positive for tremors.  Psychiatric/Behavioral:       Anxiety  All other systems reviewed and are negative.  Past Medical History   Diagnosis Date  . Anxiety   . Arthritis   . Hyperlipidemia   . Essential hypertension, benign   . Osteoporosis   . GERD (gastroesophageal reflux disease)   . Hearing loss   . Restless leg   . Tremor   . Leg edema   . Obese   . Gall bladder disease   . Cataract   . Carpal tunnel syndrome of left wrist   . Atrial fibrillation   . Sleep apnea   . Esophageal dilatation   . Frequent falls   . Parkinson disease   . History of nuclear stress test 08/31/2012    Lexiscan cardiolite negative for ischemia  . Polyneuropathy in other diseases classified elsewhere 10/03/2013   Past Surgical History  Procedure Laterality Date  . Appendectomy    . Cholecystectomy    . Abdominal hysterectomy    . Fracture surgery      Left arm x4  . Benign cyst removed from kidney and lung, bilateral mastectomy    . Bilateral great toenail removal    . Knee arthroscopy  2012    Right knee, Dr Aline Brochure  . Left forearm    . Breast surgery      Bilateral 2000  . Tonsillectomy    . R hand surgery    . Bronchoscopy  02/15/2000  . Right vats.  "  . Right upper lobe wedge resection.  "  . Upper gastrointestinal endoscopy  11/25/1999    Esophagitis/ Normal proximal esophagus, stomach and duodenum  . Colonoscopy  01/17/2009    Dr. Deatra Ina : Internal hemorrhoids/Diverticula, scattered in the ascending colon/  Moderate diverticulosis ascending colon to sigmoid colon  . Esophagogastroduodenoscopy   04/23/2003    Dr. Deatra Ina: HIATAL HERNIA  . Colonoscopy  01/16/2001    Normal  . Cataract extraction,  bilateral    . Esophagogastroduodenoscopy  03/20/2012    [id number] ring-LIKELY CAUSING MILD DYSPHAGIA/SMALL hiatal hernia/Multiple sessile polyps ranging between 3-31mm , path benign  . Cataract extraction    . Carpal tunnel release      Left wrist  . Transthoracic echocardiogram  06/24/2009    EF=>55%, mild assymetric LVH; LA mildly dilated; mild mitral annular calcif, borderline MVP, mild-mod MR; mild-mod  TR, RV systolic pressure elevated, mild pulm HTN; AV mildly sclerotic; mild pulm valve regurg - ordered r/t bradycardia   . Endovenous ablation saphenous vein w/ laser  01/2011    Right GSV  . Cholecystectomy     Family History  Problem Relation Age of Onset  . Diabetes Mother   . Heart disease Mother   . Stroke Mother   . Cancer Sister     KIDNEY  . Emphysema Sister   . Heart disease Brother   . Heart disease Sister   . Emphysema Sister   . Cancer Sister     BREAST  . Heart disease Brother   . Colon cancer Sister   . Alzheimer's disease Father   . Heart disease Sister   . Tremor Sister   . Tremor Brother   . Alcoholism Child   . Cancer Brother   . Pancreatic cancer Brother   . Diabetes Son    Social History:  reports that she has never smoked. She has never used smokeless tobacco. She reports that she does not drink alcohol or use illicit drugs. Allergies:  Allergies  Allergen Reactions  . Codeine Hives  . Morphine And Related Itching and Other (See Comments)    Makes pt hallucinate  . Actonel [Risedronate Sodium] Other (See Comments)    Abdominal pain  . Fosamax [Alendronate Sodium] Other (See Comments)    Abdominal pian  . Losartan Other (See Comments)    Hospitalized in 06/2013 with pancreatitis, losartan is associated with increased risk of pancreatitis ,  Hence discontinued  . Nitrofurantoin Other (See Comments)    Hospitalized with pancreatitis in 06/2013. Nitrofurantoin implicated as a possible cause   Medications Prior to Admission  Medication Sig Dispense Refill  . amLODipine (NORVASC) 5 MG tablet Take 5 mg by mouth daily.      [name] aspirin EC 81 MG tablet Take 81 mg by mouth every other day.      . cholecalciferol (VITAMIN D-400) 400 UNITS TABS tablet Take 400 Units by mouth daily.      [name] gabapentin (NEURONTIN) 100 MG capsule Take 1 capsule (100 mg total) by mouth at bedtime.  30 capsule  5  . lovastatin (MEVACOR) 20 MG tablet Take 1 tablet (20 mg total) by  mouth at bedtime.  30 tablet  5  . magnesium gluconate (MAGONATE) 500 MG tablet Take 500 mg by mouth 2 (two) times daily.      . metoprolol succinate (TOPROL-XL) 50 MG 24 hr tablet Take 50 mg by mouth daily. Take with or immediately following a meal.      . potassium chloride SA (K-DUR,KLOR-CON) 20 MEQ tablet Take 1 tablet by mouth daily on days when lasix is taken  14 tablet  0  . potassium chloride SA (K-DUR,KLOR-CON) 20 MEQ tablet Take 20 mEq by mouth daily.      [name] topiramate (TOPAMAX) 25 MG tablet Take 25-50 mg by mouth 2 (two) times daily. Patient takes 1 tablet in the morning and 2 tablets at bedtime.      . furosemide (LASIX) 20 MG  tablet Take 1 tablet (20 mg total) by mouth daily as needed.  14 tablet  0    Home: Home Living Family/patient expects to be discharged to:: Skilled nursing facility Living Arrangements: Children Available Help at Discharge: Family;Available PRN/intermittently Type of Home: House Home Access: Level entry Home Layout: One level Home Equipment: Walker - 2 wheels;Cane - single point;Bedside commode Additional Comments: pt has a son that lives with her. she states "i am suppose to use a walker" pt reports when she fell she was not using the walker  Functional History: Prior Function Level of Independence: Independent with assistive device(s) Gait / Transfers Assistance Needed: uses RW Comments: reports getting up from chair was difficult before at home Functional Status:  Mobility: Bed Mobility Overal bed mobility: Needs Assistance;+2 for physical assistance;+ 2 for safety/equipment Bed Mobility: Supine to Sit;Sit to Supine Supine to sit: +2 for physical assistance;Max assist Sit to supine: +2 for physical assistance;Max assist General bed mobility comments: pt exiting the bed on the right side and initiates with RT UE  Transfers Overall transfer level: Needs assistance Equipment used: Rolling walker (2 wheeled) Transfers: Sit to/from Stand Sit to  Stand: +2 physical assistance;Max assist General transfer comment: bed elevated and cues for upright postuer      ADL: ADL Overall ADL's : Needs assistance/impaired Grooming: Wash/dry face;Moderate assistance;Sitting Grooming Details (indicate cue type and reason): (A) for sitting balance Toilet Transfer: +2 for physical assistance;Maximal assistance;Stand-pivot General ADL Comments: Pt currently requires bed elevated for sit<>stand but able to complete x2. Pt oriented and reports personal errors that resulted in a fall. pt perseverating on the need to return to therapy  Cognition: Cognition Overall Cognitive Status: Within Functional Limits for tasks assessed Orientation Level: Oriented to person;Oriented to situation;Oriented to place;Disoriented to time Cognition Arousal/Alertness: Awake/alert Behavior During Therapy: WFL for tasks assessed/performed Overall Cognitive Status: Within Functional Limits for tasks assessed  Blood pressure 116/53, pulse 93, temperature 98.2 F (36.8 C), temperature source Axillary, resp. rate 18, height 5\' 6"  (1.676 m), weight 71.714 kg (158 lb 1.6 oz), SpO2 96.00%. Physical Exam  Eyes:  Pupils reactive to light  Neck: Neck supple. No thyromegaly present.  Cardiovascular:  Cardiac rate controlled  Respiratory: Breath sounds normal. No respiratory distress.  GI: Soft. Bowel sounds are normal. She exhibits no distension.  Neurological:  Speech is dysarthric. Left central 7 and tongue deviation.  Pleasantly confused but was able to provide her name age. She can provide the hospital subtle cues. Followed simple commands. Limited awareness of her deficits. She does have a right gaze preference. LUE is tr to 1/5 deltoid, bicept, tricep Hi. LLE limited by pain. Does 1 KE and ADF.APF. Can differentiate between painful and light touch.   Skin:  Her hip incision is dressed appropriately tender    Results for orders placed during the hospital encounter of  03/20/14 (from the past 24 hour(s))  CBC     Status: Abnormal   Collection Time    03/27/14  6:15 AM      Result Value Ref Range   WBC 8.6  4.0 - 10.5 K/uL   RBC 2.90 (*) 3.87 - 5.11 MIL/uL   Hemoglobin 9.4 (*) 12.0 - 15.0 g/dL   HCT 28.2 (*) 36.0 - 46.0 %   MCV 97.2  78.0 - 100.0 fL   MCH 32.4  26.0 - 34.0 pg   MCHC 33.3  30.0 - 36.0 g/dL   RDW 13.4  11.5 - 15.5 %  Platelets 218  150 - 400 K/uL  BASIC METABOLIC PANEL     Status: Abnormal   Collection Time    03/27/14  6:15 AM      Result Value Ref Range   Sodium 140  137 - 147 mEq/L   Potassium 3.8  3.7 - 5.3 mEq/L   Chloride 108  96 - 112 mEq/L   CO2 23  19 - 32 mEq/L   Glucose, Bld 119 (*) 70 - 99 mg/dL   BUN 18  6 - 23 mg/dL   Creatinine, Ser 0.46 (*) 0.50 - 1.10 mg/dL   Calcium 7.9 (*) 8.4 - 10.5 mg/dL   GFR calc non Af Amer >90  >90 mL/min   GFR calc Af Amer >90  >90 mL/min   Anion gap 9  5 - 15   Dg Femur Left  03/25/2014   CLINICAL DATA:  Left hip fracture fixation.  EXAM: LEFT FEMUR - 2 VIEW; DG C-ARM 61-120 MIN  COMPARISON:  03/20/2014  FINDINGS: There is a gamma nail in the femur with 2 proximal dynamic hip screws transfixing the comminuted intertrochanteric fracture. No complicating features.  IMPRESSION: Internal fixation of intertrochanteric fracture with anatomic reduction   Electronically Signed   By: Kalman Jewels M.D.   On: 03/25/2014 16:28   Dg C-arm 1-60 Min  03/25/2014   CLINICAL DATA:  Left hip fracture fixation.  EXAM: LEFT FEMUR - 2 VIEW; DG C-ARM 61-120 MIN  COMPARISON:  03/20/2014  FINDINGS: There is a gamma nail in the femur with 2 proximal dynamic hip screws transfixing the comminuted intertrochanteric fracture. No complicating features.  IMPRESSION: Internal fixation of intertrochanteric fracture with anatomic reduction   Electronically Signed   By: Kalman Jewels M.D.   On: 03/25/2014 16:28    Assessment/Plan: 1. Diagnosis: Right parietal/BG infarct with left IT hip fx 2. Does the need for  close, 24 hr/day medical supervision in concert with the patient's rehab needs make it unreasonable for this patient to be served in a less intensive setting? Yes 3. Co-Morbidities requiring supervision/potential complications: afib, htn, PD 4. Due to bladder management, bowel management, safety, skin/wound care, disease management, medication administration, pain management and patient education, does the patient require 24 hr/day rehab nursing? Yes 5. Does the patient require coordinated care of a physician, rehab nurse, PT (1-2 hrs/day, 5 days/week), OT (1-2 hrs/day, 5 days/week) and SLP (1-2 hrs/day, 5 days/week) to address physical and functional deficits in the context of the above medical diagnosis(es)? Yes Addressing deficits in the following areas: balance, endurance, locomotion, strength, transferring, bowel/bladder control, bathing, dressing, feeding, grooming, toileting, cognition, speech, language, swallowing and psychosocial support 6. Can the patient actively participate in an intensive therapy program of at least 3 hrs of therapy per day at least 5 days per week? Yes 7. The potential for patient to make measurable gains while on inpatient rehab is excellent 8. Anticipated functional outcomes upon discharge from inpatient rehab are min assist and mod assist  with PT, min assist and mod assist with OT, supervision with SLP. 9. Estimated rehab length of stay to reach the above functional goals is: 22-30 days 10. Does the patient have adequate social supports to accommodate these discharge functional goals? Yes 11. Anticipated D/C setting: Home 12. Anticipated post D/C treatments: HH therapy, Outpatient therapy and Home excercise program 13. Overall Rehab/Functional Prognosis: good  RECOMMENDATIONS: This patient's condition is appropriate for continued rehabilitative care in the following setting: CIR Patient has agreed to participate in  recommended program. Yes Note that insurance prior  authorization may be required for reimbursement for recommended care.  Comment: Pt is motivated to participate. She was independent and driving before hospitalization. Family appears supportive. Son says he will move in with her after dc from hospital. Rehab Admissions Coordinator to follow up.  Thanks,  Meredith Staggers, MD, Mellody Drown     03/27/2014

## 2014-03-27 NOTE — Progress Notes (Signed)
Physical Therapy Treatment Patient Details Name: Erin [name] MRN: [id number] DOB: 07/18/1929 Today's Date: 03/27/2014    History of Present Illness 80 yo female h/o freq falls, cafib, parkinsons who came in after mechanical fall while in her kitchen, sustaining a displaced intertrochanteric fracture of the left hip.She lives with her son. She has atrial fibrillation without anticoagulation secondary to frequent falls and risk of bleeding. On 03/22/2014 RN noted that the pt was unable to swallow medications and a Code Stroke was initiated. She was later noted to have slurred speech, left facial droop, and an inability to move her left arm, and a right gaze preference. CT shows acute infarct in the right basal ganglia and right parietal white matter. Pt underwent ORIF of lt hip on 03/25/14.    PT Comments    Pt very lethargic and confused this session. RN made aware of status change. Pt limited to sitting EOB for activities; demo increased posterior and lean to Rt. Will cont to follow per POC.   Follow Up Recommendations  SNF     Equipment Recommendations  Wheelchair (measurements PT);Wheelchair cushion (measurements PT)    Recommendations for Other Services       Precautions / Restrictions Precautions Precautions: Fall Precaution Comments: subluxation of LT UE noted Restrictions Weight Bearing Restrictions: Yes LLE Weight Bearing: Weight bearing as tolerated    Mobility  Bed Mobility Overal bed mobility: Needs Assistance;+2 for physical assistance;+ 2 for safety/equipment Bed Mobility: Supine to Sit;Sit to Supine     Supine to sit: +2 for physical assistance;Max assist Sit to supine: +2 for physical assistance;Max assist   General bed mobility comments: pt grimmacing in pain with movement of Lt LE; pt with decreased ability to (A) with bed mobility during this session; very lethargic; eyes open ~50% of time and requried max multimodal cues to keep pt awake at EOB; max (A) to  maintain balance heavily leaning to Rt   Transfers Overall transfer level: Needs assistance Equipment used: Rolling walker (2 wheeled) Transfers: Sit to/from Stand Sit to Stand: +2 physical assistance;Max assist         General transfer comment: did not assess today due to incr lethargy  Ambulation/Gait                 Stairs            Wheelchair Mobility    Modified Rankin (Stroke Patients Only) Modified Rankin (Stroke Patients Only) Pre-Morbid Rankin Score: Moderate disability Modified Rankin: Severe disability     Balance Overall balance assessment: Needs assistance;History of Falls Sitting-balance support: Feet supported;Single extremity supported Sitting balance-Leahy Scale: Zero Sitting balance - Comments: pt sat EOB ~10 min with posterior support; heavy leaning to Rt; attempted to work on weightshifting to Lt; pt positioned on Lt elbow to incr weightbearing through Lt UE; pt unable to push up to midline; max multimodal cues throughout shoulders to bring head up into midline; pt very kyphotic  Postural control: Right lateral lean;Posterior lean Standing balance support: No upper extremity supported;During functional activity Standing balance-Leahy Scale: Zero                      Cognition Arousal/Alertness: Lethargic Behavior During Therapy: Flat affect Overall Cognitive Status: Impaired/Different from baseline Area of Impairment: Safety/judgement;Following commands;Awareness;Problem solving;Attention;Orientation Orientation Level: Disoriented to;Person Current Attention Level: Focused Memory: Decreased short-term memory Following Commands: Follows one step commands inconsistently Safety/Judgement: Decreased awareness of deficits;Decreased awareness of safety Awareness: Intellectual Problem Solving: Slow processing;Difficulty  sequencing;Requires verbal cues;Requires tactile cues General Comments: pt referring to PT as "becky", her  granddaughter the entire session; pt appeared to be more confused vs earlier therapy session; RN made aware; pt very lethargic     Exercises Low Level/ICU Exercises Ankle Circles/Pumps: Strengthening;Both;10 reps;PROM;Supine Hip ABduction/ADduction: PROM;Both;10 reps;Other (comment) (limited in Lt due to pain ) Heel Slides: PROM;Both;10 reps    General Comments        Pertinent Vitals/Pain Pain Assessment: Faces Faces Pain Scale: Hurts whole lot Pain Location: grabbing at hip with activity Pain Descriptors / Indicators:  (did not state) Pain Intervention(s): Limited activity within patient's tolerance;Repositioned;Monitored during session    [place] expects to be discharged to:: Skilled nursing facility Living Arrangements: Children Available Help at Discharge: Family;Available PRN/intermittently Type of Home: House Home Access: Level entry   Home Layout: One level   Additional Comments: pt has a son that lives with her. she states "i am suppose to use a walker" pt reports when she fell she was not using the walker    Prior Function Level of Independence: Independent with assistive device(s)  Gait / Transfers Assistance Needed: uses RW   Comments: reports getting up from chair was difficult before at home   PT Goals (current goals can now be found in the care plan section) Acute Rehab PT Goals Patient Stated Goal: none stated today PT Goal Formulation: With patient Time For Goal Achievement: 04/09/14 Potential to Achieve Goals: Fair Progress towards PT goals: Progressing toward goals    Frequency  Min 3X/week    PT Plan Current plan remains appropriate    Co-evaluation             End of Session Equipment Utilized During Treatment: Oxygen Activity Tolerance: Patient limited by lethargy Patient left: in bed;with call bell/phone within reach;with bed alarm set     Time: 1347-1411 PT Time Calculation (min): 24 min  Charges:   $Therapeutic Exercise: 8-22 mins $Therapeutic Activity: 8-22 mins                    G CodesGustavus Bryant, [place]  [phone] 03/27/2014, 4:09 PM

## 2014-03-27 NOTE — Progress Notes (Signed)
   Subjective:  Patient is resting comfortably in bed  Objective:   VITALS:   Filed Vitals:   03/26/14 1845 03/26/14 2036 03/27/14 0124 03/27/14 0607  BP: 134/57 124/64 128/61 127/72  Pulse: 97 117 127 68  Temp: 97.8 F (36.6 C) 98.1 F (36.7 C) 98.4 F (36.9 C) 98.1 F (36.7 C)  TempSrc: Oral Oral Oral Oral  Resp: 18 18 18 18   Height:      Weight:      SpO2: 99% 97% 98% 97%    Dressings c/d/i Drowsy this am +DF/PF ankle 2+ distal pulses   Lab Results  Component Value Date   WBC 8.9 03/26/2014   HGB 10.2* 03/26/2014   HCT 30.5* 03/26/2014   MCV 96.2 03/26/2014   PLT 191 03/26/2014     Assessment/Plan:  2 Days Post-Op   - Expected postop acute blood loss anemia - will monitor for symptoms, stable - Up with PT/OT when able - asa and subQ heparin only currently due to CVA - WBAT left lower extremity  Erin [name] 03/27/2014, 7:27 AM [phone]

## 2014-03-27 NOTE — Progress Notes (Signed)
Speech Language Pathology Treatment: Dysphagia  Patient Details Name: Erin [name] MRN: [id number] DOB: May 17, 1930 Today's Date: 03/27/2014 Time: 1535-1600 SLP Time Calculation (min): 25 min  Assessment / Plan / Recommendation Clinical Impression  Skilled treatment session focused on addressing dysphagia goals.  SLP facilitated session with trials of honey and nectar-thick liquids via teaspoon and cup.  Patient demonstrated prolonged oral transit and a suspected delayed swallow initiation with intermittent multiple swallow.  Despite patient demonstrating no overt s/s of aspiration, given her positioning (reclined in bed with head turned to right), location of CVA and cognitive deficits an objective assessment is warranted prior to diet advancement.  Son present for session and SLP educated both him and patient regarding recommendations for an objective assessment and follow up services with inpatient rehabilitation; both verbalized understanding.     HPI HPI: 80 yo female h/o freq falls, cafib, parkinsons who came in after mechanical fall while in her kitchen, sustaining a displaced intertrochanteric fracture of the left hip.She lives with her son. She has atrial fibrillation without anticoagulation secondary to frequent falls and risk of bleeding. On 03/22/2014 RN noted that the pt was unable to swallow medications and a Code Stroke was initiated. She was later noted to have slurred speech, left facial droop, and an inability to move her left arm, and a right gaze preference. CT shows acute infarct in the right basal ganglia and right parietal white matter.   Pertinent Vitals Pain Assessment: No/denies pain Faces Pain Scale: Hurts even more Pain Location: Lt hip Pain Descriptors / Indicators: Discomfort Pain Intervention(s): Premedicated before session;Repositioned  SLP Plan  Continue with current plan of care    Recommendations Diet recommendations: Dysphagia 1 (puree);Honey-thick  liquid Liquids provided via: Teaspoon;Cup Medication Administration: Crushed with puree Supervision: Full supervision/cueing for compensatory strategies Compensations: Check for anterior loss;Multiple dry swallows after each bite/sip Postural Changes and/or Swallow Maneuvers: Seated upright 90 degrees;Upright 30-60 min after meal              General recommendations: Other(comment) (MBS) Oral Care Recommendations: Oral care BID Follow up Recommendations: Inpatient Rehab Plan: Continue with current plan of care    GO     Carmelia Roller., CCC-SLP [phone]  [place] 03/27/2014, 4:03 PM

## 2014-03-27 NOTE — Evaluation (Signed)
Occupational Therapy Evaluation Patient Details Name: Erin [name] MRN: [id number] DOB: 03-Dec-1929 Today's Date: 03/27/2014    History of Present Illness 80 yo female h/o freq falls, cafib, parkinsons who came in after mechanical fall while in her kitchen, sustaining a displaced intertrochanteric fracture of the left hip.She lives with her son. She has atrial fibrillation without anticoagulation secondary to frequent falls and risk of bleeding. On 03/22/2014 RN noted that the pt was unable to swallow medications and a Code Stroke was initiated. She was later noted to have slurred speech, left facial droop, and an inability to move her left arm, and a right gaze preference. CT shows acute infarct in the right basal ganglia and right parietal white matter. Pt underwent ORIF of lt hip on 03/25/14.   Clinical Impression   Pt currently demonstrates visual deficits , lt inattention, lt hemiplegia, lt hip orif and decr balance for basic adls. Pt oriented and verbalized errors during session. Pt has a son that can provided 24/ 7 (A) per patient. Ot to follow acutely to address the previous deficits listed to maximize independence.     Follow Up Recommendations  CIR    Equipment Recommendations  Other (comment) (defer CIR)    Recommendations for Other Services Rehab consult     Precautions / Restrictions Precautions Precautions: Fall Precaution Comments: subluxation of LT UE noted Restrictions Weight Bearing Restrictions: Yes LLE Weight Bearing: Weight bearing as tolerated      Mobility Bed Mobility Overal bed mobility: Needs Assistance;+2 for physical assistance;+ 2 for safety/equipment Bed Mobility: Supine to Sit;Sit to Supine     Supine to sit: +2 for physical assistance;Max assist Sit to supine: +2 for physical assistance;Max assist   General bed mobility comments: pt exiting the bed on the right side and initiates with RT UE   Transfers Overall transfer level: Needs  assistance Equipment used: Rolling walker (2 wheeled) Transfers: Sit to/from Stand Sit to Stand: +2 physical assistance;Max assist         General transfer comment: bed elevated and cues for upright postuer    Balance Overall balance assessment: Needs assistance Sitting-balance support: Single extremity supported;Feet supported Sitting balance-Leahy Scale: Poor   Postural control: Posterior lean Standing balance support: No upper extremity supported;During functional activity Standing balance-Leahy Scale: Zero                              ADL Overall ADL's : Needs assistance/impaired     Grooming: Wash/dry face;Moderate assistance;Sitting Grooming Details (indicate cue type and reason): (A) for sitting balance                 Toilet Transfer: +2 for physical assistance;Maximal assistance;Stand-pivot Pt requires LT LE blocked             General ADL Comments: Pt currently requires bed elevated for sit<>stand but able to complete x2. Pt oriented and reports personal errors that resulted in a fall. pt perseverating on the need to return to therapy     Vision                 Additional Comments: Pt iwth Lt inattention. pt closing eyes 80 % of session and states "the light bothers me" pt with adequate report in the right visual field. OT to further assess deficits with fucntional task   Perception Perception Perception Tested?: Yes Perception Deficits: Inattention/neglect Inattention/Neglect: Does not attend to left side of body;Does not attend  to left visual field   Praxis      Pertinent Vitals/Pain Pain Assessment: Faces Faces Pain Scale: Hurts even more Pain Location: Lt hip Pain Descriptors / Indicators: Discomfort Pain Intervention(s): Premedicated before session;Repositioned     Hand Dominance Right   Extremity/Trunk Assessment Upper Extremity Assessment Upper Extremity Assessment: LUE deficits/detail LUE Deficits / Details:  noted AROM with shoulder flexion and extension with muscle belly tapping. pt does not demonstrate any active digit function during session. Pt does demonstrate Adduction LUE Sensation: decreased light touch LUE Coordination: decreased fine motor;decreased gross motor   Lower Extremity Assessment Lower Extremity Assessment: Defer to PT evaluation   Cervical / Trunk Assessment Cervical / Trunk Assessment: Kyphotic   Communication Communication Communication: No difficulties   Cognition Arousal/Alertness: Awake/alert Behavior During Therapy: WFL for tasks assessed/performed Overall Cognitive Status: Within Functional Limits for tasks assessed                     General Comments       Exercises       Shoulder Instructions      Home Living Family/patient expects to be discharged to:: Skilled nursing facility Living Arrangements: Children Available Help at Discharge: Family;Available PRN/intermittently Type of Home: House Home Access: Level entry     Home Layout: One level                   Additional Comments: pt has a son that lives with her. she states "i am suppose to use a walker" pt reports when she fell she was not using the walker      Prior Functioning/Environment Level of Independence: Independent with assistive device(s)  Gait / Transfers Assistance Needed: uses RW     Comments: reports getting up from chair was difficult before at home    OT Diagnosis: Generalized weakness;Hemiplegia non-dominant side   OT Problem List: Decreased strength;Decreased activity tolerance;Impaired balance (sitting and/or standing);Decreased safety awareness;Decreased knowledge of use of DME or AE;Decreased knowledge of precautions;Impaired sensation;Impaired UE functional use;Decreased range of motion   OT Treatment/Interventions: Self-care/ADL training;Therapeutic exercise;Neuromuscular education;DME and/or AE instruction;Therapeutic activities;Visual/perceptual  remediation/compensation;Balance training;Patient/family education    OT Goals(Current goals can be found in the care plan section) Acute Rehab OT Goals Patient Stated Goal: to get rehab and maybe use a wheelchair if i have to OT Goal Formulation: With patient Time For Goal Achievement: 04/10/14 Potential to Achieve Goals: Good  OT Frequency: Min 3X/week   Barriers to D/C:            Co-evaluation              End of Session Equipment Utilized During Treatment: Gait belt Nurse Communication: Mobility status;Precautions  Activity Tolerance: Patient tolerated treatment well Patient left: in bed;with call bell/phone within reach;with bed alarm set   Time: 1141-1202 OT Time Calculation (min): 21 min Charges:  OT General Charges $OT Visit: 1 Procedure OT Evaluation $Initial OT Evaluation Tier I: 1 Procedure OT Treatments $Self Care/Home Management : 8-22 mins G-Codes:    Peri Maris April 18, 2014, 2:26 PM Pager: [phone]

## 2014-03-27 NOTE — Progress Notes (Signed)
Rehab Admissions Coordinator Note:  Patient was screened by Cleatrice Burke for appropriateness for an Inpatient Acute Rehab Consult per OT recommendation. Noted that son can provide 24/7 assist per pt.  At this time, we are recommending Inpatient Rehab consult for pt to be assessed for possible admission with Parkinsons, hip fx, and cva.  Cleatrice Burke 03/27/2014, 2:34 PM  I can be reached at [phone].

## 2014-03-28 ENCOUNTER — Inpatient Hospital Stay (HOSPITAL_COMMUNITY)
Admission: RE | Admit: 2014-03-28 | Discharge: 2014-03-28 | Disposition: A | Discharge status: Still In Hospital | Payer: Medicare Other | Source: Other Acute Inpatient Hospital | Attending: Neurology | Admitting: Neurology

## 2014-03-28 ENCOUNTER — Inpatient Hospital Stay (HOSPITAL_COMMUNITY)
Admission: RE | Admit: 2014-03-28 | Discharge: 2014-04-17 | DRG: 057 | Disposition: A | Discharge status: Skilled Nursing Facility | Payer: Medicare Other | Source: Intra-hospital | Attending: Physical Medicine & Rehabilitation | Admitting: Physical Medicine & Rehabilitation

## 2014-03-28 ENCOUNTER — Inpatient Hospital Stay (HOSPITAL_COMMUNITY): Payer: Medicare Other

## 2014-03-28 DIAGNOSIS — M6281 Muscle weakness (generalized): Secondary | ICD-10-CM

## 2014-03-28 DIAGNOSIS — S72009A Fracture of unspecified part of neck of unspecified femur, initial encounter for closed fracture: Secondary | ICD-10-CM

## 2014-03-28 DIAGNOSIS — G4730 Sleep apnea, unspecified: Secondary | ICD-10-CM | POA: Diagnosis present

## 2014-03-28 DIAGNOSIS — R159 Full incontinence of feces: Secondary | ICD-10-CM | POA: Diagnosis present

## 2014-03-28 DIAGNOSIS — H9190 Unspecified hearing loss, unspecified ear: Secondary | ICD-10-CM | POA: Diagnosis present

## 2014-03-28 DIAGNOSIS — I482 Chronic atrial fibrillation, unspecified: Secondary | ICD-10-CM

## 2014-03-28 DIAGNOSIS — E785 Hyperlipidemia, unspecified: Secondary | ICD-10-CM | POA: Diagnosis present

## 2014-03-28 DIAGNOSIS — N3000 Acute cystitis without hematuria: Secondary | ICD-10-CM

## 2014-03-28 DIAGNOSIS — G20 Parkinson's disease: Secondary | ICD-10-CM | POA: Diagnosis present

## 2014-03-28 DIAGNOSIS — I10 Essential (primary) hypertension: Secondary | ICD-10-CM | POA: Diagnosis present

## 2014-03-28 DIAGNOSIS — M76899 Other specified enthesopathies of unspecified lower limb, excluding foot: Secondary | ICD-10-CM

## 2014-03-28 DIAGNOSIS — R1310 Dysphagia, unspecified: Secondary | ICD-10-CM | POA: Diagnosis present

## 2014-03-28 DIAGNOSIS — Z7982 Long term (current) use of aspirin: Secondary | ICD-10-CM | POA: Diagnosis not present

## 2014-03-28 DIAGNOSIS — R471 Dysarthria and anarthria: Secondary | ICD-10-CM | POA: Diagnosis present

## 2014-03-28 DIAGNOSIS — S72143A Displaced intertrochanteric fracture of unspecified femur, initial encounter for closed fracture: Secondary | ICD-10-CM

## 2014-03-28 DIAGNOSIS — K449 Diaphragmatic hernia without obstruction or gangrene: Secondary | ICD-10-CM | POA: Diagnosis present

## 2014-03-28 DIAGNOSIS — S72042D Displaced fracture of base of neck of left femur, subsequent encounter for closed fracture with routine healing: Secondary | ICD-10-CM | POA: Diagnosis not present

## 2014-03-28 DIAGNOSIS — G629 Polyneuropathy, unspecified: Secondary | ICD-10-CM | POA: Diagnosis present

## 2014-03-28 DIAGNOSIS — Z5189 Encounter for other specified aftercare: Secondary | ICD-10-CM

## 2014-03-28 DIAGNOSIS — F419 Anxiety disorder, unspecified: Secondary | ICD-10-CM | POA: Diagnosis present

## 2014-03-28 DIAGNOSIS — I69959 Hemiplegia and hemiparesis following unspecified cerebrovascular disease affecting unspecified side: Secondary | ICD-10-CM

## 2014-03-28 DIAGNOSIS — S72002D Fracture of unspecified part of neck of left femur, subsequent encounter for closed fracture with routine healing: Secondary | ICD-10-CM | POA: Diagnosis present

## 2014-03-28 DIAGNOSIS — K219 Gastro-esophageal reflux disease without esophagitis: Secondary | ICD-10-CM | POA: Diagnosis present

## 2014-03-28 DIAGNOSIS — W19XXXA Unspecified fall, initial encounter: Secondary | ICD-10-CM

## 2014-03-28 DIAGNOSIS — S72142D Displaced intertrochanteric fracture of left femur, subsequent encounter for closed fracture with routine healing: Secondary | ICD-10-CM

## 2014-03-28 DIAGNOSIS — R29898 Other symptoms and signs involving the musculoskeletal system: Secondary | ICD-10-CM | POA: Diagnosis present

## 2014-03-28 DIAGNOSIS — B9620 Unspecified Escherichia coli [E. coli] as the cause of diseases classified elsewhere: Secondary | ICD-10-CM | POA: Diagnosis not present

## 2014-03-28 DIAGNOSIS — N390 Urinary tract infection, site not specified: Secondary | ICD-10-CM | POA: Diagnosis not present

## 2014-03-28 DIAGNOSIS — M129 Arthropathy, unspecified: Secondary | ICD-10-CM | POA: Diagnosis present

## 2014-03-28 DIAGNOSIS — Z66 Do not resuscitate: Secondary | ICD-10-CM

## 2014-03-28 DIAGNOSIS — G20A1 Parkinson's disease without dyskinesia, without mention of fluctuations: Secondary | ICD-10-CM | POA: Diagnosis present

## 2014-03-28 DIAGNOSIS — I4891 Unspecified atrial fibrillation: Secondary | ICD-10-CM

## 2014-03-28 DIAGNOSIS — Z9181 History of falling: Secondary | ICD-10-CM

## 2014-03-28 DIAGNOSIS — G609 Hereditary and idiopathic neuropathy, unspecified: Secondary | ICD-10-CM | POA: Diagnosis present

## 2014-03-28 DIAGNOSIS — R269 Unspecified abnormalities of gait and mobility: Secondary | ICD-10-CM

## 2014-03-28 DIAGNOSIS — G8114 Spastic hemiplegia affecting left nondominant side: Secondary | ICD-10-CM

## 2014-03-28 DIAGNOSIS — I6340 Cerebral infarction due to embolism of unspecified cerebral artery: Secondary | ICD-10-CM | POA: Diagnosis present

## 2014-03-28 DIAGNOSIS — S72142S Displaced intertrochanteric fracture of left femur, sequela: Secondary | ICD-10-CM

## 2014-03-28 DIAGNOSIS — R296 Repeated falls: Secondary | ICD-10-CM | POA: Diagnosis present

## 2014-03-28 DIAGNOSIS — I69354 Hemiplegia and hemiparesis following cerebral infarction affecting left non-dominant side: Secondary | ICD-10-CM | POA: Diagnosis not present

## 2014-03-28 DIAGNOSIS — R413 Other amnesia: Secondary | ICD-10-CM

## 2014-03-28 DIAGNOSIS — F411 Generalized anxiety disorder: Secondary | ICD-10-CM | POA: Diagnosis present

## 2014-03-28 DIAGNOSIS — I639 Cerebral infarction, unspecified: Secondary | ICD-10-CM | POA: Diagnosis present

## 2014-03-28 DIAGNOSIS — I6330 Cerebral infarction due to thrombosis of unspecified cerebral artery: Secondary | ICD-10-CM

## 2014-03-28 DIAGNOSIS — G2581 Restless legs syndrome: Secondary | ICD-10-CM | POA: Diagnosis present

## 2014-03-28 DIAGNOSIS — G8110 Spastic hemiplegia affecting unspecified side: Secondary | ICD-10-CM

## 2014-03-28 DIAGNOSIS — R2681 Unsteadiness on feet: Secondary | ICD-10-CM

## 2014-03-28 DIAGNOSIS — M810 Age-related osteoporosis without current pathological fracture: Secondary | ICD-10-CM | POA: Diagnosis present

## 2014-03-28 DIAGNOSIS — R32 Unspecified urinary incontinence: Secondary | ICD-10-CM | POA: Diagnosis present

## 2014-03-28 LAB — BASIC METABOLIC PANEL
Anion gap: 11 (ref 5–15)
BUN: 20 mg/dL (ref 6–23)
CALCIUM: 7.8 mg/dL — AB (ref 8.4–10.5)
CO2: 23 mEq/L (ref 19–32)
Chloride: 109 mEq/L (ref 96–112)
Creatinine, Ser: 0.5 mg/dL (ref 0.50–1.10)
GFR calc Af Amer: >90 mL/min (ref >90)
GFR, EST NON AFRICAN AMERICAN: 88 mL/min — AB (ref >90)
GLUCOSE: 107 mg/dL — AB (ref 70–99)
Potassium: 4.1 mEq/L (ref 3.7–5.3)
Sodium: 143 mEq/L (ref 137–147)

## 2014-03-28 LAB — CBC
HCT: 26.5 % — ABNORMAL LOW (ref 36.0–46.0)
Hemoglobin: 8.8 g/dL — ABNORMAL LOW (ref 12.0–15.0)
MCH: 32.4 pg (ref 26.0–34.0)
MCHC: 33.2 g/dL (ref 30.0–36.0)
MCV: 97.4 fL (ref 78.0–100.0)
Platelets: 206 10*3/uL (ref 150–400)
RBC: 2.72 MIL/uL — AB (ref 3.87–5.11)
RDW: 13.5 % (ref 11.5–15.5)
WBC: 7.7 10*3/uL (ref 4.0–10.5)

## 2014-03-28 LAB — IRON AND TIBC
IRON: 26 ug/dL — AB (ref 42–135)
Saturation Ratios: 19 % — ABNORMAL LOW (ref 20–55)
TIBC: 136 ug/dL — AB (ref 250–470)
UIBC: 110 ug/dL — ABNORMAL LOW (ref 125–400)

## 2014-03-28 LAB — FERRITIN: FERRITIN: 160 ng/mL (ref 10–291)

## 2014-03-28 MED ORDER — METOPROLOL TARTRATE 25 MG PO TABS
25.0000 mg | ORAL_TABLET | Freq: Three times a day (TID) | ORAL | Status: DC
  Administered 2014-03-28 – 2014-04-17 (×55): 25 mg via ORAL
  Filled 2014-03-28 (×63): qty 1

## 2014-03-28 MED ORDER — STARCH (THICKENING) PO POWD
ORAL | Status: DC | PRN
  Filled 2014-03-28 (×2): qty 227

## 2014-03-28 MED ORDER — ONDANSETRON HCL 4 MG PO TABS: 4.0000 mg | ORAL_TABLET | Freq: Four times a day (QID) | ORAL | Status: DC | PRN

## 2014-03-28 MED ORDER — SORBITOL 70 % SOLN
30.0000 mL | Freq: Every day | Status: DC | PRN
  Administered 2014-03-31 – 2014-04-11 (×3): 30 mL via ORAL
  Filled 2014-03-28 (×3): qty 30

## 2014-03-28 MED ORDER — ONDANSETRON HCL 4 MG/2ML IJ SOLN: 4.0000 mg | Freq: Four times a day (QID) | INTRAMUSCULAR | Status: DC | PRN

## 2014-03-28 MED ORDER — RESOURCE THICKENUP CLEAR PO POWD
ORAL | Status: DC | PRN
  Administered 2014-04-04: 20:00:00 via ORAL
  Filled 2014-03-28: qty 125

## 2014-03-28 MED ORDER — HEPARIN SODIUM (PORCINE) 5000 UNIT/ML IJ SOLN
5000.0000 [IU] | Freq: Three times a day (TID) | INTRAMUSCULAR | Status: DC
  Administered 2014-03-28 – 2014-04-03 (×16): 5000 [IU] via SUBCUTANEOUS
  Filled 2014-03-28 (×20): qty 1

## 2014-03-28 MED ORDER — ACETAMINOPHEN 325 MG PO TABS
650.0000 mg | ORAL_TABLET | Freq: Four times a day (QID) | ORAL | Status: DC | PRN
  Administered 2014-03-30 – 2014-04-15 (×11): 650 mg via ORAL
  Filled 2014-03-28 (×11): qty 2

## 2014-03-28 MED ORDER — RESOURCE THICKENUP CLEAR PO POWD: ORAL | Status: DC | PRN

## 2014-03-28 MED ORDER — SENNOSIDES-DOCUSATE SODIUM 8.6-50 MG PO TABS
1.0000 | ORAL_TABLET | Freq: Two times a day (BID) | ORAL | Status: DC
  Administered 2014-03-28 – 2014-04-12 (×30): 1 via ORAL
  Filled 2014-03-28 (×29): qty 1

## 2014-03-28 MED ORDER — PANTOPRAZOLE SODIUM 40 MG PO TBEC
40.0000 mg | DELAYED_RELEASE_TABLET | Freq: Every day | ORAL | Status: DC
  Administered 2014-03-29 – 2014-04-02 (×5): 40 mg via ORAL
  Filled 2014-03-28 (×6): qty 1

## 2014-03-28 MED ORDER — ASPIRIN 325 MG PO TABS
325.0000 mg | ORAL_TABLET | Freq: Every day | ORAL | Status: DC
  Administered 2014-03-29 – 2014-04-03 (×5): 325 mg via ORAL
  Filled 2014-03-28 (×7): qty 1

## 2014-03-28 MED ORDER — HEPARIN SODIUM (PORCINE) 5000 UNIT/ML IJ SOLN: 5000.0000 [IU] | Freq: Two times a day (BID) | INTRAMUSCULAR | Status: DC

## 2014-03-28 MED ORDER — OXYCODONE HCL 5 MG PO TABS
5.0000 mg | ORAL_TABLET | ORAL | Status: DC | PRN
  Administered 2014-03-28 – 2014-04-04 (×19): 10 mg via ORAL
  Administered 2014-04-04 (×2): 5 mg via ORAL
  Administered 2014-04-04 – 2014-04-08 (×8): 10 mg via ORAL
  Administered 2014-04-08: 5 mg via ORAL
  Administered 2014-04-08 – 2014-04-12 (×11): 10 mg via ORAL
  Administered 2014-04-13: 5 mg via ORAL
  Administered 2014-04-13 – 2014-04-17 (×11): 10 mg via ORAL
  Filled 2014-03-28 (×4): qty 2
  Filled 2014-03-28: qty 1
  Filled 2014-03-28 (×18): qty 2
  Filled 2014-03-28: qty 1
  Filled 2014-03-28 (×15): qty 2
  Filled 2014-03-28: qty 1
  Filled 2014-03-28 (×8): qty 2
  Filled 2014-03-28: qty 1
  Filled 2014-03-28 (×7): qty 2

## 2014-03-28 MED ORDER — ATORVASTATIN CALCIUM 40 MG PO TABS
40.0000 mg | ORAL_TABLET | Freq: Every day | ORAL | Status: DC
  Administered 2014-03-28 – 2014-04-16 (×18): 40 mg via ORAL
  Filled 2014-03-28 (×21): qty 1

## 2014-03-28 MED ORDER — ACETAMINOPHEN 325 MG PO TABS: 325.0000 mg | ORAL_TABLET | ORAL | Status: DC | PRN

## 2014-03-28 NOTE — Procedures (Signed)
Objective Swallowing Evaluation: Modified Barium Swallowing Study  Patient Details  Name: Erin [name] MRN: [id number] Date of Birth: 1929/12/08  Today's Date: 03/28/2014 Time: 6503-5465 SLP Time Calculation (min): 32 min  Past Medical History:  Past Medical History  Diagnosis Date  . Anxiety   . Arthritis   . Hyperlipidemia   . Essential hypertension, benign   . Osteoporosis   . GERD (gastroesophageal reflux disease)   . Hearing loss   . Restless leg   . Tremor   . Leg edema   . Obese   . Gall bladder disease   . Cataract   . Carpal tunnel syndrome of left wrist   . Atrial fibrillation   . Sleep apnea   . Esophageal dilatation   . Frequent falls   . Parkinson disease   . History of nuclear stress test 08/31/2012    Lexiscan cardiolite negative for ischemia  . Polyneuropathy in other diseases classified elsewhere 10/03/2013   Past Surgical History:  Past Surgical History  Procedure Laterality Date  . Appendectomy    . Cholecystectomy    . Abdominal hysterectomy    . Fracture surgery      Left arm x4  . Benign cyst removed from kidney and lung, bilateral mastectomy    . Bilateral great toenail removal    . Knee arthroscopy  2012    Right knee, Dr Aline Brochure  . Left forearm    . Breast surgery      Bilateral 2000  . Tonsillectomy    . R hand surgery    . Bronchoscopy  02/15/2000  . Right vats.  "  . Right upper lobe wedge resection.  "  . Upper gastrointestinal endoscopy  11/25/1999    Esophagitis/ Normal proximal esophagus, stomach and duodenum  . Colonoscopy  01/17/2009    Dr. Deatra Ina : Internal hemorrhoids/Diverticula, scattered in the ascending colon/  Moderate diverticulosis ascending colon to sigmoid colon  . Esophagogastroduodenoscopy   04/23/2003    Dr. Deatra Ina: HIATAL HERNIA  . Colonoscopy  01/16/2001    Normal  . Cataract extraction, bilateral    . Esophagogastroduodenoscopy  03/20/2012    KCL:EXNTZGYF ring-LIKELY CAUSING MILD DYSPHAGIA/SMALL hiatal  hernia/Multiple sessile polyps ranging between 3-41mm , path benign  . Cataract extraction    . Carpal tunnel release      Left wrist  . Transthoracic echocardiogram  06/24/2009    EF=>55%, mild assymetric LVH; LA mildly dilated; mild mitral annular calcif, borderline MVP, mild-mod MR; mild-mod TR, RV systolic pressure elevated, mild pulm HTN; AV mildly sclerotic; mild pulm valve regurg - ordered r/t bradycardia   . Endovenous ablation saphenous vein w/ laser  01/2011    Right GSV  . Cholecystectomy     HPI:  80 yo female h/o freq falls, cafib, parkinsons who came in after mechanical fall while in her kitchen, sustaining a displaced intertrochanteric fracture of the left hip.She lives with her son. She has atrial fibrillation without anticoagulation secondary to frequent falls and risk of bleeding. On 03/22/2014 RN noted that the pt was unable to swallow medications and a Code Stroke was initiated. She was later noted to have slurred speech, left facial droop, and an inability to move her left arm, and a right gaze preference. CT shows acute infarct in the right basal ganglia and right parietal white matter.     Assessment / Plan / Recommendation Clinical Impression  Dysphagia Diagnosis: Moderate oral phase dysphagia;Severe pharyngeal phase dysphagia;Moderate cervical esophageal phase dysphagia Clinical impression:  Patient demonstrates a moderate oral and severe pharyngeal phase sensory-motor dysphagia. Oral phase deficits are characterized by anterior spillage with impaired anterior to posterior transit and lingual manipulation with all consistencies. Pharyngeal phase deficits are characterized by a delayed swallow initiation with decreased base of tongue retraction, laryngeal elevation and epiglottic inversion resulting in silent penetration and aspiration of nectar-thick consistencies.  Patient without penetration/aspiration with honey-thick liquids and pureed textures. Patient demonstrated mild  base of tongue residue with all consistencies that was reduced with extra time for an additional swallow. Patient required frequent cuing of upright head posture and to maintain arousal during evaluation and as a result, it is recommended that she continue with current diet orders with SLP to advance to Dys 2 textures as arousal increases.  Patient was educated regarding current recommendations and restrictions and asked appropriate questions regarding her diet restrictions.      Treatment Recommendation  Therapy as outlined in treatment plan below    Diet Recommendation Dysphagia 1 (Puree);Honey-thick liquid   Liquid Administration via: Spoon;Cup Medication Administration: Crushed with puree Supervision: Full supervision/cueing for compensatory strategies Compensations: Check for anterior loss;Multiple dry swallows after each bite/sip;Slow rate;Small sips/bites Postural Changes and/or Swallow Maneuvers: Seated upright 90 degrees;Upright 30-60 min after meal    Other  Recommendations Oral Care Recommendations: Oral care BID   Follow Up Recommendations  Inpatient Rehab    Frequency and Duration min 2x/week  2 weeks   Pertinent Vitals/Pain none    SLP Swallow Goals  See care plan for details    General Date of Onset: 03/20/14 HPI: 80 yo female h/o freq falls, cafib, parkinsons who came in after mechanical fall while in her kitchen, sustaining a displaced intertrochanteric fracture of the left hip.She lives with her son. She has atrial fibrillation without anticoagulation secondary to frequent falls and risk of bleeding. On 03/22/2014 RN noted that the pt was unable to swallow medications and a Code Stroke was initiated. She was later noted to have slurred speech, left facial droop, and an inability to move her left arm, and a right gaze preference. CT shows acute infarct in the right basal ganglia and right parietal white matter. Type of Study: Modified Barium Swallowing Study Reason for  Referral: Objectively evaluate swallowing function Previous Swallow Assessment: none on record Diet Prior to this Study: Dysphagia 1 (puree);Honey-thick liquids Temperature Spikes Noted: No Respiratory Status: Room air History of Recent Intubation: No Behavior/Cognition: Cooperative;Lethargic;Requires cueing Oral Cavity - Dentition: Dentures, top;Dentures, bottom Oral Motor / Sensory Function: Impaired - see Bedside swallow eval Self-Feeding Abilities: Total assist Patient Positioning: Upright in chair (head down posture to the right) Baseline Vocal Quality: Clear;Low vocal intensity Volitional Cough: Weak Volitional Swallow: Able to elicit Anatomy: Within functional limits Pharyngeal Secretions: Not observed secondary MBS    Reason for Referral Objectively evaluate swallowing function   Oral Phase Oral Preparation/Oral Phase Oral Phase: Impaired Oral - Solids Oral - Puree: Weak lingual manipulation;Incomplete tongue to palate contact;Reduced posterior propulsion;Lingual/palatal residue;Delayed oral transit;Right anterior bolus loss Oral - Mechanical Soft: Weak lingual manipulation;Lingual/palatal residue;Piecemeal swallowing;Delayed oral transit;Impaired mastication;Incomplete tongue to palate contact;Reduced posterior propulsion   Pharyngeal Phase Pharyngeal Phase Pharyngeal Phase: Impaired Pharyngeal - Honey Pharyngeal - Honey Cup: Delayed swallow initiation;Premature spillage to valleculae;Other (Comment);Compensatory strategies attempted (Comment);Reduced epiglottic inversion;Reduced laryngeal elevation;Reduced tongue base retraction (base of tongue residue, extra time allowed for second swallow) Pharyngeal - Nectar Pharyngeal - Nectar Teaspoon: Premature spillage to valleculae;Delayed swallow initiation;Reduced airway/laryngeal closure;Penetration/Aspiration during swallow;Compensatory strategies attempted (Comment);Reduced epiglottic inversion;Reduced laryngeal  elevation;Reduced  tongue base retraction (cud cough effective at removing penetrates) Penetration/Aspiration details (nectar teaspoon): Material enters airway, remains ABOVE vocal cords and not ejected out Pharyngeal - Nectar Cup: Delayed swallow initiation;Premature spillage to valleculae;Reduced airway/laryngeal closure;Penetration/Aspiration during swallow;Trace aspiration;Compensatory strategies attempted (Comment);Reduced epiglottic inversion;Reduced laryngeal elevation;Reduced tongue base retraction (cuesd cough not effective at removing all aspirates) Penetration/Aspiration details (nectar cup): Material enters airway, passes BELOW cords without attempt by patient to eject out (silent aspiration) Pharyngeal - Solids Pharyngeal - Puree: Delayed swallow initiation;Premature spillage to valleculae;Reduced pharyngeal peristalsis;Other (Comment);Reduced epiglottic inversion;Reduced laryngeal elevation;Reduced tongue base retraction (base of tongue residue) Pharyngeal - Mechanical Soft: Other (Comment);Delayed swallow initiation;Premature spillage to valleculae;Reduced pharyngeal peristalsis;Reduced epiglottic inversion;Reduced laryngeal elevation;Reduced tongue base retraction (base of tongue residue)  Cervical Esophageal Phase    GO   Carmelia Roller., CCC-SLP [phone]  Cervical Esophageal Phase Cervical Esophageal Phase: Impaired Cervical Esophageal Phase - Comment Cervical Esophageal Comment: suspected reflux observed to come from distal esophagus into cervical esophagus (MD not present); patient reports things coming back up when she laid down after eating PTA         Rahiem Schellinger 03/28/2014, 2:43 PM

## 2014-03-28 NOTE — H&P (Signed)
Physical Medicine and Rehabilitation Admission H&P      Chief Complaint   Patient presents with   .  Hip Pain    : HPI: Erin [name] is a 80 y.o. right-handed female with history of question Parkinson's disease followed by Dr. Jannifer Franklin, frequent falls, atrial fibrillation on no anti-coagulation except aspirin due to frequent falls. Patient lives with family use and occasional cane walker when needed and was still driving. Presented 03/20/2014 after mechanical fall when she lost her balance landing on her left hip without loss of consciousness and was admitted to an [facility]. X-rays and imaging revealed a left intertrochanteric hip fracture. Patient noted to be confused with some left-sided weakness. Cranial CT scan showed right parietal lobe distribution of the right middle cerebral artery consistent with acute to subacute ischemia. CTA head and neck consistent with acute infarct right basal ganglia right parietal white matter. High-grade stenosis versus occlusion of distal right M1 segment. Neurology consulted. Patient did not receive TPA. Echocardiogram with ejection fraction of 60% no wall motion abnormalities. Venous Doppler studies lower extremities negative for DVT. Preoperative clearance per cardiology services underwent ORIF of intertrochanteric hip fracture 03/25/2013 per Dr.Xu. Weightbearing as tolerated left lower extremity. Placed on subcutaneous heparin for DVT prophylaxis. Aspirin EC 80 for CVA prophylaxis. Dysphagia 1 honey thick liquid diet. Physical and Occupational therapy evaluations completed 03/27/2014 with recommendations for physical medicine rehabilitation consult. Patient was admitted for comprehensive rehabilitation program  Patient states she like to stay in her other room Complains of hip pain needed cueing to remind her she has a left hip fracture, she then remembered that she had plate and screws in her hip. Patient recalls the name of her cardiologist as well as her  orthopedist Patient does not recall that she's had a stroke but remembers with cueing   ROS Review of Systems   HENT: Positive for hearing loss.   Cardiovascular: Positive for palpitations.   Gastrointestinal:   GERD   Musculoskeletal: Positive for falls.   Neurological: Positive for tremors.   Psychiatric/Behavioral:   Anxiety   All other systems reviewed and are negative    Past Medical History   Diagnosis  Date   .  Anxiety     .  Arthritis     .  Hyperlipidemia     .  Essential hypertension, benign     .  Osteoporosis     .  GERD (gastroesophageal reflux disease)     .  Hearing loss     .  Restless leg     .  Tremor     .  Leg edema     .  Obese     .  Gall bladder disease     .  Cataract     .  Carpal tunnel syndrome of left wrist     .  Atrial fibrillation     .  Sleep apnea     .  Esophageal dilatation     .  Frequent falls     .  Parkinson disease     .  History of nuclear stress test  08/31/2012       Lexiscan cardiolite negative for ischemia   .  Polyneuropathy in other diseases classified elsewhere  10/03/2013    Past Surgical History   Procedure  Laterality  Date   .  Appendectomy       .  Cholecystectomy       .  Abdominal hysterectomy       .  Fracture surgery           Left arm x4   .  Benign cyst removed from kidney and lung, bilateral mastectomy       .  Bilateral great toenail removal       .  Knee arthroscopy    2012       Right knee, Dr Aline Brochure   .  Left forearm       .  Breast surgery           Bilateral 2000   .  Tonsillectomy       .  R hand surgery       .  Bronchoscopy    02/15/2000   .  Right vats.    "   .  Right upper lobe wedge resection.    "   .  Upper gastrointestinal endoscopy    11/25/1999       Esophagitis/ Normal proximal esophagus, stomach and duodenum   .  Colonoscopy    01/17/2009       Dr. Deatra Ina : Internal hemorrhoids/Diverticula, scattered in the ascending colon/  Moderate diverticulosis ascending colon to sigmoid  colon   .  Esophagogastroduodenoscopy     04/23/2003       Dr. Deatra Ina: HIATAL HERNIA   .  Colonoscopy    01/16/2001       Normal   .  Cataract extraction, bilateral       .  Esophagogastroduodenoscopy    03/20/2012       OEU:MPNTIRWE ring-LIKELY CAUSING MILD DYSPHAGIA/SMALL hiatal hernia/Multiple sessile polyps ranging between 3-40m , path benign   .  Cataract extraction       .  Carpal tunnel release           Left wrist   .  Transthoracic echocardiogram    06/24/2009       EF=>55%, mild assymetric LVH; LA mildly dilated; mild mitral annular calcif, borderline MVP, mild-mod MR; mild-mod TR, RV systolic pressure elevated, mild pulm HTN; AV mildly sclerotic; mild pulm valve regurg - ordered r/t bradycardia    .  Endovenous ablation saphenous vein w/ laser    01/2011       Right GSV   .  Cholecystectomy        Family History   Problem  Relation  Age of Onset   .  Diabetes  Mother     .  Heart disease  Mother     .  Stroke  Mother     .  Cancer  Sister         KIDNEY   .  Emphysema  Sister     .  Heart disease  Brother     .  Heart disease  Sister     .  Emphysema  Sister     .  Cancer  Sister         BREAST   .  Heart disease  Brother     .  Colon cancer  Sister     .  Alzheimer's disease  Father     .  Heart disease  Sister     .  Tremor  Sister     .  Tremor  Brother     .  Alcoholism  Child     .  Cancer  Brother     .  Pancreatic cancer  Brother     .  Diabetes  Son  Social History: reports that she has never smoked. She has never used smokeless tobacco. She reports that she does not drink alcohol or use illicit drugs. Allergies:  Allergies   Allergen  Reactions   .  Codeine  Hives   .  Morphine And Related  Itching and Other (See Comments)       Makes pt hallucinate   .  Actonel [Risedronate Sodium]  Other (See Comments)       Abdominal pain   .  Fosamax [Alendronate Sodium]  Other (See Comments)       Abdominal pian   .  Losartan  Other (See Comments)        Hospitalized in 06/2013 with pancreatitis, losartan is associated with increased risk of pancreatitis ,  Hence discontinued   .  Nitrofurantoin  Other (See Comments)       Hospitalized with pancreatitis in 06/2013. Nitrofurantoin implicated as a possible cause    Medications Prior to Admission   Medication  Sig  Dispense  Refill   .  amLODipine (NORVASC) 5 MG tablet  Take 5 mg by mouth daily.         [name]  aspirin EC 81 MG tablet  Take 81 mg by mouth every other day.         .  cholecalciferol (VITAMIN D-400) 400 UNITS TABS tablet  Take 400 Units by mouth daily.         [name]  gabapentin (NEURONTIN) 100 MG capsule  Take 1 capsule (100 mg total) by mouth at bedtime.   30 capsule   5   .  lovastatin (MEVACOR) 20 MG tablet  Take 1 tablet (20 mg total) by mouth at bedtime.   30 tablet   5   .  magnesium gluconate (MAGONATE) 500 MG tablet  Take 500 mg by mouth 2 (two) times daily.         .  metoprolol succinate (TOPROL-XL) 50 MG 24 hr tablet  Take 50 mg by mouth daily. Take with or immediately following a meal.         .  potassium chloride SA (K-DUR,KLOR-CON) 20 MEQ tablet  Take 1 tablet by mouth daily on days when lasix is taken   14 tablet   0   .  potassium chloride SA (K-DUR,KLOR-CON) 20 MEQ tablet  Take 20 mEq by mouth daily.         [name]  topiramate (TOPAMAX) 25 MG tablet  Take 25-50 mg by mouth 2 (two) times daily. Patient takes 1 tablet in the morning and 2 tablets at bedtime.         .  furosemide (LASIX) 20 MG tablet  Take 1 tablet (20 mg total) by mouth daily as needed.   14 tablet   0      Home: Home Living Family/patient expects to be discharged to:: Skilled nursing facility Living Arrangements: Children Available Help at Discharge: Family;Available PRN/intermittently Type of Home: House Home Access: Level entry Home Layout: One level Home Equipment: Walker - 2 wheels;Cane - single point;Bedside commode Additional Comments: pt has a son that lives with her. she states "i am suppose to  use a walker" pt reports when she fell she was not using the walker    Functional History: Prior Function Level of Independence: Independent with assistive device(s) Gait / Transfers Assistance Needed: uses RW Comments: reports getting up from chair was difficult before at home   Functional Status:   Mobility: Bed Mobility Overal bed  mobility: Needs Assistance;+2 for physical assistance;+ 2 for safety/equipment Bed Mobility: Supine to Sit;Sit to Supine Supine to sit: +2 for physical assistance;Max assist Sit to supine: +2 for physical assistance;Max assist General bed mobility comments: pt grimmacing in pain with movement of Lt LE; pt with decreased ability to (A) with bed mobility during this session; very lethargic; eyes open ~50% of time and requried max multimodal cues to keep pt awake at EOB; max (A) to maintain balance heavily leaning to Rt  Transfers Overall transfer level: Needs assistance Equipment used: Rolling walker (2 wheeled) Transfers: Sit to/from Stand Sit to Stand: +2 physical assistance;Max assist General transfer comment: did not assess today due to incr lethargy   ADL: ADL Overall ADL's : Needs assistance/impaired Grooming: Wash/dry face;Moderate assistance;Sitting Grooming Details (indicate cue type and reason): (A) for sitting balance Toilet Transfer: +2 for physical assistance;Maximal assistance;Stand-pivot General ADL Comments: Pt currently requires bed elevated for sit<>stand but able to complete x2. Pt oriented and reports personal errors that resulted in a fall. pt perseverating on the need to return to therapy   Cognition: Cognition Overall Cognitive Status: Impaired/Different from baseline Orientation Level: Oriented X4 Cognition Arousal/Alertness: Lethargic Behavior During Therapy: Flat affect Overall Cognitive Status: Impaired/Different from baseline Area of Impairment: Safety/judgement;Following commands;Awareness;Problem  solving;Attention;Orientation Orientation Level: Disoriented to;Person Current Attention Level: Focused Memory: Decreased short-term memory Following Commands: Follows one step commands inconsistently Safety/Judgement: Decreased awareness of deficits;Decreased awareness of safety Awareness: Intellectual Problem Solving: Slow processing;Difficulty sequencing;Requires verbal cues;Requires tactile cues General Comments: pt referring to PT as "becky", her granddaughter the entire session; pt appeared to be more confused vs earlier therapy session; RN made aware; pt very lethargic    Physical Exam: Blood pressure 123/48, pulse 102, temperature 98.5 F (36.9 C), temperature source Oral, resp. rate 18, height _0  (1.676 m), weight 71.714 kg (158 lb 1.6 oz), SpO2 96.00%. Physical Exam Eyes:  Pupils reactive to light   Neck: Neck supple. No thyromegaly present.   Cardiovascular:  Cardiac rate controlled   Respiratory: Breath sounds normal. No respiratory distress.   GI: Soft. Bowel sounds are normal. She exhibits no distension.  Neurological:  Speech is dysarthric. Left central 7 and tongue deviation. Pleasantly confused but was able to provide her name age. She can provide the hospital subtle cues. Followed simple commands. Limited awareness of her deficits. She does have a right gaze preference. LUE is tr to 2/5 deltoid, bicep, tricep 0/5 Grip. LLE limited by pain. Does 1 KE and ADF.APF. Can differentiate between painful and light touch. Normal strength on the right side Skin:  Her hip incision is dressed appropriately tender , ecchymosis along the left lateral thigh    Results for orders placed during the hospital encounter of 03/20/14 (from the past 48 hour(s))   CBC     Status: Abnormal     Collection Time      03/27/14  6:15 AM       Result  Value  Ref Range     WBC  8.6   4.0 - 10.5 K/uL     RBC  2.90 (*)  3.87 - 5.11 MIL/uL     Hemoglobin  9.4 (*)  12.0 - 15.0 g/dL     HCT  28.2  (*)  36.0 - 46.0 %     MCV  97.2   78.0 - 100.0 fL     MCH  32.4   26.0 - 34.0 pg     MCHC  33.3  30.0 - 36.0 g/dL     RDW  13.4   11.5 - 15.5 %     Platelets  218   150 - 400 K/uL   BASIC METABOLIC PANEL     Status: Abnormal     Collection Time      03/27/14  6:15 AM       Result  Value  Ref Range     Sodium  140   137 - 147 mEq/L     Potassium  3.8   3.7 - 5.3 mEq/L     Chloride  108   96 - 112 mEq/L     CO2  23   19 - 32 mEq/L     Glucose, Bld  119 (*)  70 - 99 mg/dL     BUN  18   6 - 23 mg/dL     Creatinine, Ser  0.46 (*)  0.50 - 1.10 mg/dL     Calcium  7.9 (*)  8.4 - 10.5 mg/dL     GFR calc non Af Amer  >90   >90 mL/min     GFR calc Af Amer  >90   >90 mL/min     Comment:  (NOTE)        The eGFR has been calculated using the CKD EPI equation.        This calculation has not been validated in all clinical situations.        eGFR's persistently <90 mL/min signify possible Chronic Kidney        Disease.     Anion gap  9   5 - 15   CBC     Status: Abnormal     Collection Time      03/28/14  4:30 AM       Result  Value  Ref Range     WBC  7.7   4.0 - 10.5 K/uL     RBC  2.72 (*)  3.87 - 5.11 MIL/uL     Hemoglobin  8.8 (*)  12.0 - 15.0 g/dL     HCT  26.5 (*)  36.0 - 46.0 %     MCV  97.4   78.0 - 100.0 fL     MCH  32.4   26.0 - 34.0 pg     MCHC  33.2   30.0 - 36.0 g/dL     RDW  13.5   11.5 - 15.5 %     Platelets  206   150 - 400 K/uL   FERRITIN     Status: None     Collection Time      03/28/14  4:30 AM       Result  Value  Ref Range     Ferritin  160   10 - 291 ng/mL     Comment:  Performed at [place]BC     Status: Abnormal     Collection Time      03/28/14  4:30 AM       Result  Value  Ref Range     Iron  26 (*)  42 - 135 ug/dL     TIBC  136 (*)  250 - 470 ug/dL     Saturation Ratios  19 (*)  20 - 55 %     UIBC  110 (*)  125 - 400 ug/dL     Comment:  Performed at [facility]  Status: Abnormal      Collection Time      03/28/14  4:30 AM       Result  Value  Ref Range     Sodium  143   137 - 147 mEq/L     Potassium  4.1   3.7 - 5.3 mEq/L     Chloride  109   96 - 112 mEq/L     CO2  23   19 - 32 mEq/L     Glucose, Bld  107 (*)  70 - 99 mg/dL     BUN  20   6 - 23 mg/dL     Creatinine, Ser  0.50   0.50 - 1.10 mg/dL     Calcium  7.8 (*)  8.4 - 10.5 mg/dL     GFR calc non Af Amer  88 (*)  >90 mL/min     GFR calc Af Amer  >90   >90 mL/min     Comment:  (NOTE)        The eGFR has been calculated using the CKD EPI equation.        This calculation has not been validated in all clinical situations.        eGFR's persistently <90 mL/min signify possible Chronic Kidney        Disease.     Anion gap  11   5 - 15    No results found.         Medical Problem List and Plan: 1. Functional deficits secondary to an embolic right parietal basal ganglia infarcts as well as left intertrochanteric hip fracture. Status post ORIF 03/25/2013. Weightbearing as tolerated 2.  DVT Prophylaxis/Anticoagulation: Subcutaneous heparin. Monitor platelet counts any signs of bleeding. Venous Doppler studies negative 3. Pain Management: Oxycodone as needed. Monitor with increased mobility 4. Dysphagia. Dysphagia 1 honey thick liquids. Monitor hydration. Followup speech therapy 5. Neuropsych: This patient is not capable of making decisions on her own behalf. 6. Skin/Wound Care: Routine skin checks 7. Hypertension/atrial fibrillation. Lopressor 25 mg 3 times a day. Cardiac rate control and will 8. Hyperlipidemia. Lipitor 9. GERD. Protonix     Post Admission Physician Evaluation: Functional deficits secondary  to Left HP from right parietal ,basal ganglia infarcts as well as left intertrochanteric hip fracture. Status post ORIF 03/25/2013. Patient is admitted to receive collaborative, interdisciplinary care between the physiatrist, rehab nursing staff, and therapy team. Patient's level of medical complexity  and substantial therapy needs in context of that medical necessity cannot be provided at a lesser intensity of care such as a SNF. Patient has experienced substantial functional loss from his/her baseline which was documented above under the "Functional History" and "Functional Status" headings.  Judging by the patient's diagnosis, physical exam, and functional history, the patient has potential for functional progress which will result in measurable gains while on inpatient rehab.  These gains will be of substantial and practical use upon discharge  in facilitating mobility and self-care at the household level. Physiatrist will provide 24 hour management of medical needs as well as oversight of the therapy plan/treatment and provide guidance as appropriate regarding the interaction of the two. 24 hour rehab nursing will assist with bladder management, bowel management, safety, skin/wound care, disease management, medication administration, pain management and patient education  and help integrate therapy concepts, techniques,education, etc. PT will assess and treat for/with: pre gait, gait training, endurance , safety, equipment, neuromuscular re education.   Goals are: Sup/minA. OT will assess  and treat for/with: ADLs, Cognitive perceptual skills, Neuromuscular re education, safety, endurance, equipment .   Goals are: Min A. Therapy can proceed with showering this patient. SLP will assess and treat for/with:  memory, attention, left neglect, communication .  Goals are:  express basic needs and by of apical information without cueing, . Case Management and Social Worker will assess and treat for psychological issues and discharge planning. Team conference will be held weekly to assess progress toward goals and to determine barriers to discharge. Patient will receive at least 3 hours of therapy per day at least 5 days per week. ELOS: 22-26d        Prognosis:  good and fair   Charlett Blake  M.D. [facility] FAAPM&R (Sports Med, Neuromuscular Med) Diplomate Am Board of Electrodiagnostic Med  03/28/2014

## 2014-03-28 NOTE — Progress Notes (Signed)
Rehab admissions - Evaluated for possible admission.  I met with patient's son.  Son would like inpatient rehab.  Bed available and will admit to acute inpatient rehab today.  Call me for questions.  #[phone]

## 2014-03-28 NOTE — Discharge Summary (Signed)
Stroke Discharge Summary  Patient ID: Erin [name]   MRN: [id number]      DOB: 10-24-1929  Date of Admission: 03/22/2014 Date of Discharge: 03/28/2014  Attending Physician: Rosalin Hawking, Stroke MD  Consulting Physician(s):    cardiology, rehabilitation medicine and orthopedic surgery  Patient's PCP:  Tula Nakayama, MD  Discharge Diagnoses:  - acute right MCA ischemic stroke - chronic afib - left intertrochanteric femur fracture - Anemia - HTN - HLD - migraine  BMI  There is no weight on file to calculate BMI.  Past Medical History  Diagnosis Date  . Anxiety   . Arthritis   . Hyperlipidemia   . Essential hypertension, benign   . Osteoporosis   . GERD (gastroesophageal reflux disease)   . Hearing loss   . Restless leg   . Tremor   . Leg edema   . Obese   . Gall bladder disease   . Cataract   . Carpal tunnel syndrome of left wrist   . Atrial fibrillation   . Sleep apnea   . Esophageal dilatation   . Frequent falls   . Parkinson disease   . History of nuclear stress test 08/31/2012    Lexiscan cardiolite negative for ischemia  . Polyneuropathy in other diseases classified elsewhere 10/03/2013   Past Surgical History  Procedure Laterality Date  . Appendectomy    . Cholecystectomy    . Abdominal hysterectomy    . Fracture surgery      Left arm x4  . Benign cyst removed from kidney and lung, bilateral mastectomy    . Bilateral great toenail removal    . Knee arthroscopy  2012    Right knee, Dr Aline Brochure  . Left forearm    . Breast surgery      Bilateral 2000  . Tonsillectomy    . R hand surgery    . Bronchoscopy  02/15/2000  . Right vats.  "  . Right upper lobe wedge resection.  "  . Upper gastrointestinal endoscopy  11/25/1999    Esophagitis/ Normal proximal esophagus, stomach and duodenum  . Colonoscopy  01/17/2009    Dr. Deatra Ina : Internal hemorrhoids/Diverticula, scattered in the ascending colon/  Moderate diverticulosis ascending colon to sigmoid  colon  . Esophagogastroduodenoscopy   04/23/2003    Dr. Deatra Ina: HIATAL HERNIA  . Colonoscopy  01/16/2001    Normal  . Cataract extraction, bilateral    . Esophagogastroduodenoscopy  03/20/2012    WUJ:WJXBJYNW ring-LIKELY CAUSING MILD DYSPHAGIA/SMALL hiatal hernia/Multiple sessile polyps ranging between 3-38mm , path benign  . Cataract extraction    . Carpal tunnel release      Left wrist  . Transthoracic echocardiogram  06/24/2009    EF=>55%, mild assymetric LVH; LA mildly dilated; mild mitral annular calcif, borderline MVP, mild-mod MR; mild-mod TR, RV systolic pressure elevated, mild pulm HTN; AV mildly sclerotic; mild pulm valve regurg - ordered r/t bradycardia   . Endovenous ablation saphenous vein w/ laser  01/2011    Right GSV  . Cholecystectomy      Medications to be continued on Rehab  Facility-Administered Medications Ordered in Other Encounters: acetaminophen (TYLENOL) tablet 650 mg, 650 mg, Oral, Q6H PRN, Lavon Paganini Angiulli, PA-C;  [START ON 03/29/2014] aspirin tablet 325 mg, 325 mg, Oral, Daily, Daniel J Angiulli, PA-C;  atorvastatin (LIPITOR) tablet 40 mg, 40 mg, Oral, q1800, Lavon Paganini Angiulli, PA-C, 40 mg at 03/28/14 2015;  food thickener (THICK IT) powder, , Oral, PRN, Lavon Paganini  [place], PA-C heparin injection 5,000 Units, 5,000 Units, Subcutaneous, 3 times per day, Cathlyn Parsons, PA-C, 5,000 Units at 03/28/14 2225;  metoprolol tartrate (LOPRESSOR) tablet 25 mg, 25 mg, Oral, TID, Lavon Paganini Angiulli, PA-C, 25 mg at 03/28/14 2015;  ondansetron (ZOFRAN) injection 4 mg, 4 mg, Intravenous, Q6H PRN, Lavon Paganini Angiulli, PA-C;  ondansetron (ZOFRAN) tablet 4 mg, 4 mg, Oral, Q6H PRN, Lavon Paganini Angiulli, PA-C oxyCODONE (Oxy IR/ROXICODONE) immediate release tablet 5-10 mg, 5-10 mg, Oral, Q4H PRN, Lavon Paganini Angiulli, PA-C, 10 mg at 03/28/14 2015;  [START ON 03/29/2014] pantoprazole (PROTONIX) EC tablet 40 mg, 40 mg, Oral, Daily, Lavon Paganini Angiulli, PA-C;  RESOURCE THICKENUP CLEAR, , Oral, PRN,  Charlett Blake, MD;  senna-docusate (Senokot-S) tablet 1 tablet, 1 tablet, Oral, BID, Lavon Paganini Angiulli, PA-C, 1 tablet at 03/28/14 2015 sorbitol 70 % solution 30 mL, 30 mL, Oral, Daily PRN, Lavon Paganini Angiulli, PA-C   LABORATORY STUDIES CBC    Component Value Date/Time   WBC 7.7 03/28/2014 0430   RBC 2.72* 03/28/2014 0430   HGB 8.8* 03/28/2014 0430   HCT 26.5* 03/28/2014 0430   PLT 206 03/28/2014 0430   MCV 97.4 03/28/2014 0430   MCH 32.4 03/28/2014 0430   MCHC 33.2 03/28/2014 0430   RDW 13.5 03/28/2014 0430   LYMPHSABS 2.5 03/20/2014 1721   MONOABS 0.6 03/20/2014 1721   EOSABS 0.1 03/20/2014 1721   BASOSABS 0.0 03/20/2014 1721   CMP    Component Value Date/Time   NA 143 03/28/2014 0430   K 4.1 03/28/2014 0430   CL 109 03/28/2014 0430   CO2 23 03/28/2014 0430   GLUCOSE 107* 03/28/2014 0430   BUN 20 03/28/2014 0430   CREATININE 0.50 03/28/2014 0430   CREATININE 0.76 02/07/2014 0807   CALCIUM 7.8* 03/28/2014 0430   PROT 6.1 02/07/2014 0807   ALBUMIN 4.0 02/07/2014 0807   AST 23 02/07/2014 0807   ALT 11 02/07/2014 0807   ALKPHOS 63 02/07/2014 0807   BILITOT 0.6 02/07/2014 0807   GFRNONAA 88* 03/28/2014 0430   GFRAA >90 03/28/2014 0430   COAGS Lab Results  Component Value Date   INR 1.15 03/20/2014   INR 1.12 03/20/2014   Lipid Panel    Component Value Date/Time   CHOL 208* 03/23/2014 0425   TRIG 91 03/23/2014 0425   HDL 56 03/23/2014 0425   CHOLHDL 3.7 03/23/2014 0425   VLDL 18 03/23/2014 0425   LDLCALC 134* 03/23/2014 0425   HgbA1C  Lab Results  Component Value Date   HGBA1C 6.1* 03/22/2014   Cardiac Panel (last 3 results) No results found for this basename: CKTOTAL, CKMB, TROPONINI, RELINDX,  in the last 72 hours Urinalysis    Component Value Date/Time   COLORURINE YELLOW 03/20/2014 2215   APPEARANCEUR HAZY* 03/20/2014 2215   LABSPEC 1.015 03/20/2014 2215   PHURINE 7.5 03/20/2014 2215   GLUCOSEU NEGATIVE 03/20/2014 2215   HGBUR NEGATIVE 03/20/2014 2215   BILIRUBINUR NEGATIVE 03/20/2014 2215    BILIRUBINUR negative 02/15/2014 0901   KETONESUR NEGATIVE 03/20/2014 2215   PROTEINUR NEGATIVE 03/20/2014 2215   PROTEINUR negative 02/15/2014 0901   UROBILINOGEN 0.2 03/20/2014 2215   UROBILINOGEN 0.2 02/15/2014 0901   NITRITE NEGATIVE 03/20/2014 2215   NITRITE negative 02/15/2014 0901   LEUKOCYTESUR TRACE* 03/20/2014 2215   Urine Drug Screen  No results found for this basename: labopia, cocainscrnur, labbenz, amphetmu, thcu, labbarb    Alcohol Level No results found for this basename: eth     SIGNIFICANT  DIAGNOSTIC STUDIES Ct Angio Head and neck W/cm &/or Wo Cm  03/22/2014 IMPRESSION: Acute infarct in the right basal ganglia and right parietal white matter. There is decreased perfusion of a much larger area of the right temporal parietal lobe in the right MCA territory compatible with tissue at risk of further infarction. CTA reveals high-grade stenosis versus occlusion distal right M1 segment with hypoperfusion of right MCA branches. Carotid artery and vertebral artery widely patent in the neck bilaterally.  Ct Head Wo Contrast  03/22/2014 IMPRESSION: There is a decreased attenuation in the right parietal lobe/ distribution of the right middle cerebral artery consistent with acute to subacute ischemia.  Venous doppler - Lower extremity venous duplex has been completed. Preliminary findings: no evidence of DVT  MRI brain - Acute right MCA infarct involving the deep white matter, external  capsule, and a small amount of the insular cortex. No associated  hemorrhage or midline shift.  Generalized atrophy with mild chronic microvascular ischemia.  2D echo - Left ventricle: The cavity size was normal. There was mild concentric hypertrophy. Systolic function was normal. The estimated ejection fraction was in the range of 55% to 60%. Wall motion was normal; there were no regional wall motion abnormalities. - Left atrium: The atrium was mildly dilated. - Right ventricle: The cavity size was mildly  dilated. - Right atrium: The atrium was mildly dilated. - Tricuspid valve: There was moderate regurgitation directed centrally. - Pulmonary arteries: Systolic pressure was mildly increased. PA peak pressure: 38 mm Hg (S).     HISTORY OF PRESENT ILLNES OLUWADARA GORMAN is an 80 y.o. female who lives at home and had a fall resulting in left hip fracture on 9/9. She was brought to [facility]. She was noted to be confused at that time but today staff noted she had a left sided deficit. Code stroke was not called due to CT findings showing > 1/3 right MCA infarct. Patient was transferred to [facility] hospital for further evaluation and possible endovascular intervention. On arrival patient was alert, able to stated name and follow minimal commands. She showed a left sided paresis and right gaze preference. IR was called and they recommended CT perfusion to evaluate for mismatch.  Date last known well: Date: 03/21/2014  Time last known well: Unable to determine there was a change noted at 10:30 this AM but it was also noted she was more confused yesterday.  tPA Given: No: out of the window  She was admitted to the neuro ICU for further evaluation and treatment.  HOSPITAL COURSE Pt was admitted for right MCA stroke. No intervention is indicated. She has Afib but no on AC due to frequent fall. She was put on ASA for bridging. She will need anticoagulation around day 14 after stroke. She also had orthopedic procedure done on day 3, and she tolerating well.   Stroke - right MCA territory, embolic secondary to afib not on [facility]  aspirin 81 mg orally every day prior to admission, now on aspirin 325 mg orally every day  MRI showed moderate sized right MCA stroke, consistent with embolic pattern  CTA head and neck showed right MCA stroke but no LVO  2D Echo EF 55-60%  LDL 134, added lipitor for stroke prevention and HLD  HgbA1c 6.1 WNL  SCDs and heparin subq for VTE prophylaxis  Cardiac diet with honey think  liquid.  PT/OT recommend CIR. Afib  - chronic, not on AC in the past due to frequent fall  -  currently rate well controlled  - cardiology on board, appreciate their help  - continue metoprolol for rate control  - will consider to start anticoagulation with eliquis 5mg  bid around 10-14 days after stroke (next Monday to Wednesday). - ASA 325mg  for now.  Anemia  - H&H downing trending  - monitor daily  - iron study  Hypertension  Home meds: norvasc and metoprolol. metoprolo was resumed in hospital with holding parameters. BP tends to be low, will continue the fluids BP goal normotensive Hyperlipidemia  Home meds: lovastatin 20mg   LDL 134  Started lipitor 40mg   LDL goal < 100  Other Stroke Risk Factors  Advanced age Obesity, Body mass index is 25.53 kg/(m^2).  Left hip fracture s/p surgery  ortho on board  Tolerated surgery well  On fentanyl and oxycodone prn for pain Other Pertinent History  Migraine on topamax at home  DISCHARGE EXAM There were no vitals taken for this visit.  General - Well nourished, well developed, lethargic.  Ophthalmologic - not cooperative on exam.  Cardiovascular - irregularly irregular HR and rhythm.  Mental Status -  Mildly sleepy, but open eyes on voice, naming and repeating intact, follows simple commands and language fluent. Orientated to people and self and to time and place.  Cranial Nerves II - XII -  II - visual field intact.  III, IV, VI - right eye gaze preference, but able to cross midline.  V - Facial sensation intact bilaterally.  VII - left facial droop.  VIII - Hearing & vestibular intact bilaterally.  X - Palate elevates symmetrically.  XI - Chin turning & shoulder shrug intact bilaterally.  XII - Tongue protrusion intact.  Motor Strength - LUE proximal 3-/5, distal 2/5, LLE not able to move proximally due to pain but able to wiggle toes at least 3+/5, RUE 5-/5 and RLE 4+/5.  Reflexes - The patient's reflexes were 1+ in all  extremities and she had no pathological reflexes.  Sensory - Light touch, temperature/pinprick were assessed and were symmetric.  Coordination - right hand FTN intact. Tremor was absent.  Gait and Station - not able to test.  Discharge Diet    cardiac honey thick liquids  DISCHARGE PLAN  Disposition:  Transfer to [facility] for ongoing PT, OT and ST  aspirin 325 mg orally every day for secondary stroke prevention.  Recommend ongoing risk factor control by Primary Care Physician at time of discharge from inpatient rehabilitation. Risk factor recommendations:  Hypertension target range 130-140/70-80 Lipid range - LDL < 100 and checked every 6 months, fasting Diabetes - HgB A1C <7 Smoking cessation   Follow-up Tula Nakayama, MD in 2 weeks following discharge from rehab.  Follow-up with Dr. Rosalin Hawking in Stroke Clinic in 2 months.  45 minutes were spent preparing discharge.  Rosalin Hawking, MD PhD Stroke Neurology 03/28/2014 10:58 PM    Signed

## 2014-03-28 NOTE — Progress Notes (Signed)
Report called to RN in [facility] who will be getting this pt. All questions answered. Pt will be transferred to [id number].

## 2014-03-28 NOTE — PMR Pre-admission (Signed)
PMR Admission Coordinator Pre-Admission Assessment  Patient: Erin [name] is an 80 y.o., female MRN: [id number] DOB: 1929/10/01 Height: 5\' 6"  (167.6 cm) Weight: 71.714 kg (158 lb 1.6 oz)              Insurance Information HMO:      PPO:       PCP:       IPA:       80/20:       OTHER:   PRIMARY:  Medicare A/B      Policy#: [id number]      Subscriber: Gaynelle Adu CM Name:        Phone#:       Fax#:   Pre-Cert#:        Employer: Retired Benefits:  Phone #:       Name: Checked in [place]. Date: 11/09/96     Deduct: $1260      Out of Pocket Max: none      Life Max: unlimited CIR: 100%      SNF: 100 days Outpatient: 80%     Co-Pay: 20% Home Health: 100%      Co-Pay: none DME: 80%     Co-Pay: 20% Providers: patient's choice  SECONDARY: [employer]      Policy#: [id number]      Subscriber: Gaynelle Adu CM Name:        Phone#:       Fax#:   Pre-Cert#:        Employer: Retired Benefits:  Phone #: [phone]     Name:   Eff. Date:      Deduct:        Out of Pocket Max:        Life Max:   CIR:        SNF:   Outpatient:       Co-Pay:   Home Health:        Co-Pay:   DME:       Co-Pay:     Emergency Contact Information Contact Information   Name Relation Home Work Morris Son [place]   Turner,Charlotte Niece [phone]  [phone]   [facility] Relative [phone]  [phone]   Zaidy, Absher   [phone]     Current Medical History  Patient Admitting Diagnosis:  R parietal/BG infarct/ L IT hip fx  History of Present Illness: An 80 y.o. right-handed female with history of question Parkinson's disease followed by Dr. Jannifer Franklin, frequent falls, atrial fibrillation on no anti-coagulation except aspirin. Patient lives with family use and occasional cane walker when needed and was still driving. Presented 03/20/2014 after mechanical fall when she lost her balance landing on her left hip without loss of consciousness and was admitted  to an [facility]. X-rays and imaging revealed a left intertrochanteric hip fracture. Patient noted to be confused with some left-sided weakness. Cranial CT scan showed right parietal lobe distribution of the right middle cerebral artery consistent with acute to subacute ischemia. CTA head and neck consistent with acute infarct right basal ganglia right parietal white matter. High-grade stenosis versus occlusion of distal right M1 segment. Neurology consulted. Patient did not receive TPA. Echocardiogram with ejection fraction of 60% no wall motion abnormalities. Venous Doppler studies lower extremities negative for DVT. Preoperative clearance per cardiology services underwent ORIF of intertrochanteric hip fracture 03/25/2013 per Dr.Xu. Weightbearing as tolerated left lower extremity. Placed on subcutaneous heparin for DVT prophylaxis. Aspirin EC 80 for CVA prophylaxis. Dysphagia 1 honey thick  liquid diet. Occupational therapy evaluation completed 03/27/2014 with recommendations for physical medicine rehabilitation consult.     Total: 7=NIH  Past Medical History  Past Medical History  Diagnosis Date  . Anxiety   . Arthritis   . Hyperlipidemia   . Essential hypertension, benign   . Osteoporosis   . GERD (gastroesophageal reflux disease)   . Hearing loss   . Restless leg   . Tremor   . Leg edema   . Obese   . Gall bladder disease   . Cataract   . Carpal tunnel syndrome of left wrist   . Atrial fibrillation   . Sleep apnea   . Esophageal dilatation   . Frequent falls   . Parkinson disease   . History of nuclear stress test 08/31/2012    Lexiscan cardiolite negative for ischemia  . Polyneuropathy in other diseases classified elsewhere 10/03/2013    Family History  family history includes Alcoholism in her child; Alzheimer's disease in her father; Cancer in her brother, sister, and sister; Colon cancer in her sister; Diabetes in her mother and son; Emphysema in her sister and sister; Heart disease  in her brother, brother, mother, sister, and sister; Pancreatic cancer in her brother; Stroke in her mother; Tremor in her brother and sister.  Prior Rehab/Hospitalizations:  Had rehab for right shoulder/arm after a fracture many years ago.   Current Medications  Current facility-administered medications:0.9 %  sodium chloride infusion, , Intravenous, Continuous, Naiping Eduard Roux, MD, Last Rate: 50 mL/hr at 03/27/14 2015;  acetaminophen (TYLENOL) tablet 650 mg, 650 mg, Oral, Q6H PRN, Marianna Payment, MD, 650 mg at 03/27/14 0514;  alum & mag hydroxide-simeth (MAALOX/MYLANTA) 200-200-20 MG/5ML suspension 30 mL, 30 mL, Oral, Q4H PRN, Marianna Payment, MD, 30 mL at 03/28/14 1055 antiseptic oral rinse (CPC / CETYLPYRIDINIUM CHLORIDE 0.05%) solution 7 mL, 7 mL, Mouth Rinse, BID, Naiping Eduard Roux, MD, 7 mL at 03/27/14 2122;  aspirin tablet 325 mg, 325 mg, Oral, Daily, Rosalin Hawking, MD, 325 mg at 03/28/14 1053;  atorvastatin (LIPITOR) tablet 40 mg, 40 mg, Oral, q1800, Rosalin Hawking, MD, 40 mg at 03/27/14 1727 fentaNYL (SUBLIMAZE) injection 12.5-50 mcg, 12.5-50 mcg, Intravenous, Q2H PRN, Brand Males, MD, 50 mcg at 03/27/14 1002;  food thickener (THICK IT) powder, , Oral, PRN, Rosalin Hawking, MD;  heparin injection 5,000 Units, 5,000 Units, Subcutaneous, Q12H, Naiping Eduard Roux, MD, 5,000 Units at 03/28/14 1054;  menthol-cetylpyridinium (CEPACOL) lozenge 3 mg, 1 lozenge, Oral, PRN, Naiping Eduard Roux, MD metoprolol (LOPRESSOR) injection 2.5-5 mg, 2.5-5 mg, Intravenous, Q3H PRN, Colbert Coyer, MD, 5 mg at 03/26/14 0030;  metoprolol tartrate (LOPRESSOR) tablet 25 mg, 25 mg, Oral, TID, Luke K Kilroy, PA-C, 25 mg at 03/28/14 1053;  ondansetron (ZOFRAN) injection 4 mg, 4 mg, Intravenous, Q6H PRN, Naiping Eduard Roux, MD oxyCODONE (Oxy IR/ROXICODONE) immediate release tablet 5-10 mg, 5-10 mg, Oral, Q4H PRN, Naiping Eduard Roux, MD, 5 mg at 03/28/14 1053;  pantoprazole (PROTONIX) EC tablet 40 mg, 40 mg, Oral,  Daily, Rosalin Hawking, MD, 40 mg at 03/28/14 1055;  phenol (CHLORASEPTIC) mouth spray 1 spray, 1 spray, Mouth/Throat, PRN, Naiping Eduard Roux, MD;  RESOURCE THICKENUP CLEAR, , Oral, PRN, Rosalin Hawking, MD senna-docusate (Senokot-S) tablet 1 tablet, 1 tablet, Oral, BID, Rosalin Hawking, MD, 1 tablet at 03/28/14 1054  Patients Current Diet: Dysphagia  Precautions / Restrictions Precautions Precautions: Fall Precaution Comments: subluxation of LT UE noted Restrictions Weight Bearing Restrictions: Yes LLE Weight Bearing: Weight bearing as tolerated  Prior Activity Level Community (5-7x/wk): Went out daily, was driving.  Home Assistive Devices / Equipment Home Assistive Devices/Equipment: Cane (specify quad or straight);Walker (specify type);Dentures (specify type) Home Equipment: Walker - 2 wheels;Cane - single point;Bedside commode  Prior Functional Level Prior Function Level of Independence: Independent with assistive device(s) Gait / Transfers Assistance Needed: uses RW Comments: reports getting up from chair was difficult before at home  Current Functional Level Cognition  Overall Cognitive Status: Impaired/Different from baseline Current Attention Level: Focused Orientation Level: Oriented X4 Following Commands: Follows one step commands inconsistently Safety/Judgement: Decreased awareness of deficits;Decreased awareness of safety General Comments: pt referring to PT as "becky", her granddaughter the entire session; pt appeared to be more confused vs earlier therapy session; RN made aware; pt very lethargic     Extremity Assessment (includes Sensation/Coordination)  Upper Extremity Assessment: Defer to OT evaluation  Lower Extremity Assessment: Generalized weakness;LLE deficits/detail  LLE Deficits / Details: Pt able to move against gravity with assist.   ADLs  Overall ADL's : Needs assistance/impaired Grooming: Wash/dry face;Moderate assistance;Sitting Grooming Details (indicate  cue type and reason): (A) for sitting balance Toilet Transfer: +2 for physical assistance;Maximal assistance;Stand-pivot General ADL Comments: Pt currently requires bed elevated for sit<>stand but able to complete x2. Pt oriented and reports personal errors that resulted in a fall. pt perseverating on the need to return to therapy    Mobility  Overal bed mobility: Needs Assistance;+2 for physical assistance;+ 2 for safety/equipment Bed Mobility: Supine to Sit;Sit to Supine Supine to sit: +2 for physical assistance;Max assist Sit to supine: +2 for physical assistance;Max assist General bed mobility comments: pt grimmacing in pain with movement of Lt LE; pt with decreased ability to (A) with bed mobility during this session; very lethargic; eyes open ~50% of time and requried max multimodal cues to keep pt awake at EOB; max (A) to maintain balance heavily leaning to Rt     Transfers  Overall transfer level: Needs assistance Equipment used: Rolling walker (2 wheeled) Transfers: Sit to/from Stand Sit to Stand: +2 physical assistance;Max assist General transfer comment: did not assess today due to incr lethargy    Ambulation / Gait / Stairs / Office manager / Balance Dynamic Sitting Balance Sitting balance - Comments: pt sat EOB ~10 min with posterior support; heavy leaning to Rt; attempted to work on weightshifting to [employer]; pt positioned on Lt elbow to incr weightbearing through Lt UE; pt unable to push up to midline; max multimodal cues throughout shoulders to bring head up into midline; pt very kyphotic     Special needs/care consideration BiPAP/CPAP No CPM No Continuous Drip IV 0.9% NS 50 ml/hr Dialysis No        Life Vest No Oxygen No Special Bed No Trach Size No Wound Vac (area) No      Skin  Left hip surgical incision                             Bowel mgmt: Last BM 03/27/14 Bladder mgmt: Urinary catheter Diabetic mgmt No    Previous Home Environment Living  Arrangements: Children Available Help at Discharge: Family;Available PRN/intermittently Type of Home: House Home Layout: One level Home Access: Level entry Home Care Services: No Additional Comments: pt has a son that lives with her. she states "i am suppose to use a walker" pt reports when she fell she was not using the walker  Discharge Living Setting Plans for Discharge Living Setting: Patient's home;House;Lives with (comment) (Son [name] lives with patient.) Type of Home at Discharge: House Discharge Home Layout: One level Discharge Home Access: Stairs to enter Entrance Stairs-Number of Steps: 1 small step at entrance Does the patient have any problems obtaining your medications?: No  Social/Family/Support Systems Patient Roles: Parent (Has 2 sons.) Contact Information: Stefana Lodico - son (h) [phone] (c) [phone] Anticipated Caregiver: Lynne Logan - son Anticipated Caregiver's Contact Information: [name] [phone] Ability/Limitations of Caregiver: Leroy Kennedy does not work.  Barrie Folk works and lives 1 1/2 miles away Caregiver Availability: 24/7 Discharge Plan Discussed with Primary Caregiver: Yes Is Caregiver In Agreement with Plan?: Yes Does Caregiver/Family have Issues with Lodging/Transportation while Pt is in Rehab?: No  Goals/Additional Needs Patient/Family Goal for Rehab: PT/OT min/mod assist, ST supervision goals Expected length of stay: 22-30 days Cultural Considerations: Christian Dietary Needs: Dys 1, honey thick liquids Equipment Needs: TBD Pt/Family Agrees to Admission and willing to participate: Yes Program Orientation Provided & Reviewed with Pt/Caregiver Including Roles  & Responsibilities: Yes  Decrease burden of Care through IP rehab admission: N/A  Possible need for SNF placement upon discharge: Yes, if patient does not progress to point where son can manage at home.  Patient Condition: This patient's condition remains as documented in the  consult dated 03/28/14, in which the Rehabilitation Physician determined and documented that the patient's condition is appropriate for intensive rehabilitative care in an inpatient rehabilitation facility. Will admit to inpatient rehab today.  Preadmission Screen Completed By:  Retta Diones, 03/28/2014 2:39 PM ______________________________________________________________________   Discussed status with Dr. Letta Pate on 03/28/14 at 1438 and received telephone approval for admission today.  Admission Coordinator:  Retta Diones, time1438/Date09/17/15

## 2014-03-29 ENCOUNTER — Inpatient Hospital Stay (HOSPITAL_COMMUNITY): Payer: Medicare Other | Admitting: Speech Pathology

## 2014-03-29 ENCOUNTER — Inpatient Hospital Stay (HOSPITAL_COMMUNITY): Payer: Medicare Other

## 2014-03-29 ENCOUNTER — Inpatient Hospital Stay (HOSPITAL_COMMUNITY): Payer: Medicare Other | Admitting: Physical Therapy

## 2014-03-29 LAB — COMPREHENSIVE METABOLIC PANEL WITH GFR
ALT: 12 U/L (ref 0–35)
AST: 22 U/L (ref 0–37)
Albumin: 2 g/dL — ABNORMAL LOW (ref 3.5–5.2)
Alkaline Phosphatase: 62 U/L (ref 39–117)
Anion gap: 12 (ref 5–15)
BUN: 16 mg/dL (ref 6–23)
CO2: 25 meq/L (ref 19–32)
Calcium: 7.9 mg/dL — ABNORMAL LOW (ref 8.4–10.5)
Chloride: 107 meq/L (ref 96–112)
Creatinine, Ser: 0.43 mg/dL — ABNORMAL LOW (ref 0.50–1.10)
GFR calc Af Amer: >90 mL/min
GFR calc non Af Amer: >90 mL/min
Glucose, Bld: 109 mg/dL — ABNORMAL HIGH (ref 70–99)
Potassium: 4.1 meq/L (ref 3.7–5.3)
Sodium: 144 meq/L (ref 137–147)
Total Bilirubin: 0.5 mg/dL (ref 0.3–1.2)
Total Protein: 4.7 g/dL — ABNORMAL LOW (ref 6.0–8.3)

## 2014-03-29 LAB — CBC WITH DIFFERENTIAL/PLATELET
BASOS ABS: 0 10*3/uL (ref 0.0–0.1)
BASOS PCT: 0 % (ref 0–1)
Eosinophils Absolute: 0.1 10*3/uL (ref 0.0–0.7)
Eosinophils Relative: 2 % (ref 0–5)
HCT: 27.4 % — ABNORMAL LOW (ref 36.0–46.0)
Hemoglobin: 8.9 g/dL — ABNORMAL LOW (ref 12.0–15.0)
Lymphocytes Relative: 18 % (ref 12–46)
Lymphs Abs: 1.6 10*3/uL (ref 0.7–4.0)
MCH: 31.8 pg (ref 26.0–34.0)
MCHC: 32.5 g/dL (ref 30.0–36.0)
MCV: 97.9 fL (ref 78.0–100.0)
Monocytes Absolute: 0.6 10*3/uL (ref 0.1–1.0)
Monocytes Relative: 7 % (ref 3–12)
NEUTROS ABS: 6.5 10*3/uL (ref 1.7–7.7)
Neutrophils Relative %: 73 % (ref 43–77)
PLATELETS: 245 10*3/uL (ref 150–400)
RBC: 2.8 MIL/uL — ABNORMAL LOW (ref 3.87–5.11)
RDW: 13.3 % (ref 11.5–15.5)
WBC: 8.8 10*3/uL (ref 4.0–10.5)

## 2014-03-29 MED ORDER — SODIUM CHLORIDE 0.45 % IV SOLN
INTRAVENOUS | Status: DC
  Administered 2014-03-29 – 2014-03-31 (×4): via INTRAVENOUS
  Administered 2014-04-01: 900 mL via INTRAVENOUS
  Administered 2014-04-04: 21:00:00 via INTRAVENOUS
  Administered 2014-04-06: 375 mL via INTRAVENOUS
  Administered 2014-04-07 – 2014-04-15 (×4): via INTRAVENOUS

## 2014-03-29 NOTE — Telephone Encounter (Signed)
Closed encounter °

## 2014-03-29 NOTE — Progress Notes (Signed)
Social Work Assessment and Plan Social Work Assessment and Plan  Patient Details  Name: Erin [name] MRN: [id number] Date of Birth: [birth year]  Today's Date: 03/29/2014  Problem List:  Patient Active Problem List   Diagnosis Date Noted  . CVA (cerebral infarction) 03/28/2014  . Cerebral embolism with cerebral infarction 03/23/2014  . Preoperative cardiovascular examination 03/21/2014  . Fracture, intertrochanteric, left femur 03/20/2014  . Fall at home 03/20/2014  . Hyperlipidemia with target LDL less than 100 02/16/2014  . Need for vaccination with 13-polyvalent pneumococcal conjugate vaccine 02/14/2014  . Acute cystitis 02/14/2014  . Allergic rhinitis 10/15/2013  . Insomnia 10/15/2013  . Polyneuropathy in other diseases classified elsewhere 10/03/2013  . Forgetfulness 08/05/2013  . Swelling of both ankles 06/25/2013  . Leg swelling 06/25/2013  . Acute pancreatitis 06/17/2013  . Chronic atrial fibrillation 04/09/2013  . Atypical chest pain 04/03/2013  . DNR (do not resuscitate) discussion 02/13/2013  . DNR (do not resuscitate) 02/13/2013  . Hearing loss 02/13/2013  . Muscle weakness (generalized) 11/24/2012  . Left shoulder pain 11/13/2012  . Unspecified constipation 09/05/2012  . H/O falling 08/01/2012  . Routine general medical examination at a health care facility 04/16/2012  . FH: colon cancer 02/23/2012  . Memory loss 11/08/2011  . Other general symptoms 11/08/2011  . Other vitamin B12 deficiency anemia 11/08/2011  . Essential and other specified forms of tremor 11/08/2011  . Unsteady gait 10/14/2011  . Osteoporosis, senile   . Prediabetes 03/16/2011  . BURSITIS, RIGHT KNEE 01/05/2010  . SPINAL STENOSIS, LUMBAR 11/20/2009  . SCIATICA 11/13/2009  . OSTEOARTHRITIS, KNEE, RIGHT 09/11/2009  . UNSTEADY GAIT 09/11/2009  . MEDIAL MENISCUS TEAR, RIGHT 09/11/2009  . Essential hypertension, benign 01/02/2009  . GERD 01/02/2009  . HEMORRHAGE OF RECTUM AND ANUS  01/02/2009  . PAIN IN JOINT, HAND 11/20/2008  . Pain in joint, pelvic region and thigh 11/20/2008  . KNEE PAIN 11/20/2008  . ARTHRITIS, LEFT FOOT 07/17/2007   Past Medical History:  Past Medical History  Diagnosis Date  . Anxiety   . Arthritis   . Hyperlipidemia   . Essential hypertension, benign   . Osteoporosis   . GERD (gastroesophageal reflux disease)   . Hearing loss   . Restless leg   . Tremor   . Leg edema   . Obese   . Gall bladder disease   . Cataract   . Carpal tunnel syndrome of left wrist   . Atrial fibrillation   . Sleep apnea   . Esophageal dilatation   . Frequent falls   . Parkinson disease   . History of nuclear stress test 08/31/2012    Lexiscan cardiolite negative for ischemia  . Polyneuropathy in other diseases classified elsewhere 10/03/2013   Past Surgical History:  Past Surgical History  Procedure Laterality Date  . Appendectomy    . Cholecystectomy    . Abdominal hysterectomy    . Fracture surgery      Left arm x4  . Benign cyst removed from kidney and lung, bilateral mastectomy    . Bilateral great toenail removal    . Knee arthroscopy  2012    Right knee, Dr Aline Brochure  . Left forearm    . Breast surgery      Bilateral 2000  . Tonsillectomy    . R hand surgery    . Bronchoscopy  02/15/2000  . Right vats.  "  . Right upper lobe wedge resection.  "  . Upper gastrointestinal endoscopy  11/25/1999  Esophagitis/ Normal proximal esophagus, stomach and duodenum  . Colonoscopy  01/17/2009    Dr. Deatra Ina : Internal hemorrhoids/Diverticula, scattered in the ascending colon/  Moderate diverticulosis ascending colon to sigmoid colon  . Esophagogastroduodenoscopy   04/23/2003    Dr. Deatra Ina: HIATAL HERNIA  . Colonoscopy  01/16/2001    Normal  . Cataract extraction, bilateral    . Esophagogastroduodenoscopy  03/20/2012    MPN:TIRWERXV ring-LIKELY CAUSING MILD DYSPHAGIA/SMALL hiatal hernia/Multiple sessile polyps ranging between 3-37mm , path benign   . Cataract extraction    . Carpal tunnel release      Left wrist  . Transthoracic echocardiogram  06/24/2009    EF=>55%, mild assymetric LVH; LA mildly dilated; mild mitral annular calcif, borderline MVP, mild-mod MR; mild-mod TR, RV systolic pressure elevated, mild pulm HTN; AV mildly sclerotic; mild pulm valve regurg - ordered r/t bradycardia   . Endovenous ablation saphenous vein w/ laser  01/2011    Right GSV  . Cholecystectomy     Social History:  reports that she has never smoked. She has never used smokeless tobacco. She reports that she does not drink alcohol or use illicit drugs.  Family / Support Systems Marital Status: Widow/Widower Patient Roles: Parent Children: Terry-son  [phone]  [phone] Other Supports: Dale-son  [phone]-home  [phone]-cell Anticipated Caregiver: Son's and extended family Ability/Limitations of Caregiver: Family to come up with a plan-aware she will need 24 hr care Caregiver Availability: Other (Comment) (family to come up with a plan) Family Dynamics: Close knit family who are very involved in each other's life.  Son-terry lives with her and helps some.  [name] other son lives a mile away.  Encouraged them to discuss plan at discharge and who can provide care.  Social History Preferred language: English Religion: Christian Cultural Background: No issues Education: High School Read: Yes Write: Yes Employment Status: Retired Freight forwarder Issues: No issues Guardian/Conservator: None-according to MD pt is not capable of mkaing her own decisions at this time.  Will look toward both son's since no formal POA in place.  They are working on this   Abuse/Neglect Physical Abuse: Denies Verbal Abuse: Denies Sexual Abuse: Denies Exploitation of patient/patient's resources: Denies Self-Neglect: Denies  Emotional Status Pt's affect, behavior adn adjustment status: Pt is motivated to improve but very exhausted from her procedures.   She is still trying to recover from her hip surgery.  She forgets she had a stroke but aware of her left side weakness.  Son's plan to be here daily and involved in her care. Recent Psychosocial Issues: Other medical issues-recent hip fracture and now stroke Pyschiatric History: hx-anxiety has taken medicines in the past was not on any meds PTA.  Pt has a strong faith and relies upon her faith in God.  Will monitor while here and see if need to intervene but follow along for support. Substance Abuse History: No issues  Patient / Family Perceptions, Expectations & Goals Pt/Family understanding of illness & functional limitations: Pt can explain her hip fracture, but not always aware of her stroke.  Son's have been involved and have a good understanding of their mother's diagnosis and deficits.  Son states: " She has a long way to go."   Premorbid pt/family roles/activities: Mother, grandmother, retiree, Home owner, church member etc Anticipated changes in roles/activities/participation: resume Pt/family expectations/goals: Pt states; " God has a lot do to with me being here."  " I want to go good here."  Tery states; " I will need to  help her more at home."  [name] states: " She has along way to go, but she can do it."  [employer]: None Premorbid Home Care/DME Agencies: None Transportation available at discharge: Son's and other extended family members Resource referrals recommended: Support group (specify) ([facility] Support group)  Discharge Planning Living Arrangements: [place]: Children;Other relatives;Friends/neighbors;Church/faith community Type of Residence: Private residence Insurance Resources: Multimedia programmer (specify) Personnel officer Medicare) Financial Resources: Social Security Financial Screen Referred: No Living Expenses: Own Money Management: Patient Does the patient have any problems obtaining your medications?: No Home Management: Patient and  son help with home management Patient/Family Preliminary Plans: Return home with family providing care, informed them she would require 24 hr care upon discharge.  They will come up with a plan and discuss what each member can do.  Son's plan to be here often and observe her in therapies. Social Work Anticipated Follow Up Needs: HH/OP;Support Group  Clinical Impression Pleasant female who answers questions but falls asleep in the next instant.  Both son's are here and supportive and are working on a discharge plan. Pt is unfortunate with a hip fracture on top of a stroke, atleast it is on the same side.  Will work with on a safe discharge plan.  Aware she will require 24 hr care.  Elease Hashimoto 03/29/2014, 10:48 AM

## 2014-03-29 NOTE — Progress Notes (Signed)
PMR Admission Coordinator Pre-Admission Assessment  Patient: Erin [name] is an 80 y.o., female  MRN: [id number]  DOB: 1930/04/22  Height: 5\' 6"  (167.6 cm)  Weight: 71.714 kg (158 lb 1.6 oz)  Insurance Information  HMO: PPO: PCP: IPA: 80/20: OTHER:  PRIMARY: Medicare A/B Policy#: [id number] Subscriber: Gaynelle Adu  CM Name: Phone#: Fax#:  Pre-Cert#: Employer: Retired  Benefits: Phone #: Name: Checked in [place]. Date: 11/09/96 Deduct: $1260 Out of Pocket Max: none Life Max: unlimited  CIR: 100% SNF: 100 days  Outpatient: 80% Co-Pay: 20%  Home Health: 100% Co-Pay: none  DME: 80% Co-Pay: 20%  Providers: patient's choice   SECONDARY: [employer] Policy#: [id number] Subscriber: Gaynelle Adu  CM Name: Phone#: Fax#:  Pre-Cert#: Employer: Retired  Benefits: Phone #: [phone] Name:  Eff. Date: Deduct: Out of Pocket Max: Life Max:  CIR: SNF:  Outpatient: Co-Pay:  Home Health: Co-Pay:  DME: Co-Pay:  Emergency Contact Information  Contact Information    Name  Relation  Home  Work  [place]  Son  Herron    Turner,Charlotte  Niece  [phone]   [phone]    [facility]  Relative  [phone]   [phone]    Marisah, Laker    [phone]      Current Medical History  Patient Admitting Diagnosis: R parietal/BG infarct/ L IT hip fx  History of Present Illness: An 80 y.o. right-handed female with history of question Parkinson's disease followed by Dr. Jannifer Franklin, frequent falls, atrial fibrillation on no anti-coagulation except aspirin. Patient lives with family use and occasional cane walker when needed and was still driving. Presented 03/20/2014 after mechanical fall when she lost her balance landing on her left hip without loss of consciousness and was admitted to an [facility]. X-rays and imaging revealed a left intertrochanteric hip fracture. Patient noted to be confused with some left-sided weakness. Cranial CT scan showed right  parietal lobe distribution of the right middle cerebral artery consistent with acute to subacute ischemia. CTA head and neck consistent with acute infarct right basal ganglia right parietal white matter. High-grade stenosis versus occlusion of distal right M1 segment. Neurology consulted. Patient did not receive TPA. Echocardiogram with ejection fraction of 60% no wall motion abnormalities. Venous Doppler studies lower extremities negative for DVT. Preoperative clearance per cardiology services underwent ORIF of intertrochanteric hip fracture 03/25/2013 per Dr.Xu. Weightbearing as tolerated left lower extremity. Placed on subcutaneous heparin for DVT prophylaxis. Aspirin EC 80 for CVA prophylaxis. Dysphagia 1 honey thick liquid diet. Occupational therapy evaluation completed 03/27/2014 with recommendations for physical medicine rehabilitation consult.  Total: 7=NIH  Past Medical History  Past Medical History   Diagnosis  Date   .  Anxiety    .  Arthritis    .  Hyperlipidemia    .  Essential hypertension, benign    .  Osteoporosis    .  GERD (gastroesophageal reflux disease)    .  Hearing loss    .  Restless leg    .  Tremor    .  Leg edema    .  Obese    .  Gall bladder disease    .  Cataract    .  Carpal tunnel syndrome of left wrist    .  Atrial fibrillation    .  Sleep apnea    .  Esophageal dilatation    .  Frequent falls    .  Parkinson disease    .  History of nuclear stress test  08/31/2012     Lexiscan cardiolite negative for ischemia   .  Polyneuropathy in other diseases classified elsewhere  10/03/2013    Family History  family history includes Alcoholism in her child; Alzheimer's disease in her father; Cancer in her brother, sister, and sister; Colon cancer in her sister; Diabetes in her mother and son; Emphysema in her sister and sister; Heart disease in her brother, brother, mother, sister, and sister; Pancreatic cancer in her brother; Stroke in her mother; Tremor in her  brother and sister.  Prior Rehab/Hospitalizations: Had rehab for right shoulder/arm after a fracture many years ago.  Current Medications  Current facility-administered medications:0.9 % sodium chloride infusion, , Intravenous, Continuous, Naiping Eduard Roux, MD, Last Rate: 50 mL/hr at 03/27/14 2015; acetaminophen (TYLENOL) tablet 650 mg, 650 mg, Oral, Q6H PRN, Marianna Payment, MD, 650 mg at 03/27/14 0514; alum & mag hydroxide-simeth (MAALOX/MYLANTA) 200-200-20 MG/5ML suspension 30 mL, 30 mL, Oral, Q4H PRN, Marianna Payment, MD, 30 mL at 03/28/14 1055  antiseptic oral rinse (CPC / CETYLPYRIDINIUM CHLORIDE 0.05%) solution 7 mL, 7 mL, Mouth Rinse, BID, Naiping Eduard Roux, MD, 7 mL at 03/27/14 2122; aspirin tablet 325 mg, 325 mg, Oral, Daily, Rosalin Hawking, MD, 325 mg at 03/28/14 1053; atorvastatin (LIPITOR) tablet 40 mg, 40 mg, Oral, q1800, Rosalin Hawking, MD, 40 mg at 03/27/14 1727  fentaNYL (SUBLIMAZE) injection 12.5-50 mcg, 12.5-50 mcg, Intravenous, Q2H PRN, Brand Males, MD, 50 mcg at 03/27/14 1002; food thickener (THICK IT) powder, , Oral, PRN, Rosalin Hawking, MD; heparin injection 5,000 Units, 5,000 Units, Subcutaneous, Q12H, Naiping Eduard Roux, MD, 5,000 Units at 03/28/14 1054; menthol-cetylpyridinium (CEPACOL) lozenge 3 mg, 1 lozenge, Oral, PRN, Naiping Eduard Roux, MD  metoprolol (LOPRESSOR) injection 2.5-5 mg, 2.5-5 mg, Intravenous, Q3H PRN, Colbert Coyer, MD, 5 mg at 03/26/14 0030; metoprolol tartrate (LOPRESSOR) tablet 25 mg, 25 mg, Oral, TID, Luke K Kilroy, PA-C, 25 mg at 03/28/14 1053; ondansetron (ZOFRAN) injection 4 mg, 4 mg, Intravenous, Q6H PRN, Naiping Eduard Roux, MD  oxyCODONE (Oxy IR/ROXICODONE) immediate release tablet 5-10 mg, 5-10 mg, Oral, Q4H PRN, Naiping Eduard Roux, MD, 5 mg at 03/28/14 1053; pantoprazole (PROTONIX) EC tablet 40 mg, 40 mg, Oral, Daily, Rosalin Hawking, MD, 40 mg at 03/28/14 1055; phenol (CHLORASEPTIC) mouth spray 1 spray, 1 spray, Mouth/Throat, PRN, Naiping Eduard Roux, MD; RESOURCE THICKENUP CLEAR, , Oral, PRN, Rosalin Hawking, MD  senna-docusate (Senokot-S) tablet 1 tablet, 1 tablet, Oral, BID, Rosalin Hawking, MD, 1 tablet at 03/28/14 1054  Patients Current Diet: Dysphagia  Precautions / Restrictions  Precautions  Precautions: Fall  Precaution Comments: subluxation of LT UE noted  Restrictions  Weight Bearing Restrictions: Yes  LLE Weight Bearing: Weight bearing as tolerated  Prior Activity Level  Community (5-7x/wk): Went out daily, was driving.  Home Assistive Devices / Equipment  Home Assistive Devices/Equipment: Cane (specify quad or straight);Walker (specify type);Dentures (specify type)  Home Equipment: Walker - 2 wheels;Cane - single point;Bedside commode  Prior Functional Level  Prior Function  Level of Independence: Independent with assistive device(s)  Gait / Transfers Assistance Needed: uses RW  Comments: reports getting up from chair was difficult before at home  Current Functional Level  Cognition  Overall Cognitive Status: Impaired/Different from baseline  Current Attention Level: Focused  Orientation Level: Oriented X4  Following Commands: Follows one step commands inconsistently  Safety/Judgement: Decreased awareness of deficits;Decreased awareness of safety  General Comments: pt referring to PT as "becky", her granddaughter the entire  session; pt appeared to be more confused vs earlier therapy session; RN made aware; pt very lethargic   Extremity Assessment  (includes Sensation/Coordination)  Upper Extremity Assessment: Defer to OT evaluation  Lower Extremity Assessment: Generalized weakness;LLE deficits/detail  LLE Deficits / Details: Pt able to move against gravity with assist.   ADLs  Overall ADL's : Needs assistance/impaired  Grooming: Wash/dry face;Moderate assistance;Sitting  Grooming Details (indicate cue type and reason): (A) for sitting balance  Toilet Transfer: +2 for physical assistance;Maximal assistance;Stand-pivot   General ADL Comments: Pt currently requires bed elevated for sit<>stand but able to complete x2. Pt oriented and reports personal errors that resulted in a fall. pt perseverating on the need to return to therapy   Mobility  Overal bed mobility: Needs Assistance;+2 for physical assistance;+ 2 for safety/equipment  Bed Mobility: Supine to Sit;Sit to Supine  Supine to sit: +2 for physical assistance;Max assist  Sit to supine: +2 for physical assistance;Max assist  General bed mobility comments: pt grimmacing in pain with movement of Lt LE; pt with decreased ability to (A) with bed mobility during this session; very lethargic; eyes open ~50% of time and requried max multimodal cues to keep pt awake at EOB; max (A) to maintain balance heavily leaning to Rt   Transfers  Overall transfer level: Needs assistance  Equipment used: Rolling walker (2 wheeled)  Transfers: Sit to/from Stand  Sit to Stand: +2 physical assistance;Max assist  General transfer comment: did not assess today due to incr lethargy   Ambulation / Gait / Stairs / Education administrator / Balance  Dynamic Sitting Balance  Sitting balance - Comments: pt sat EOB ~10 min with posterior support; heavy leaning to Rt; attempted to work on weightshifting to [employer]; pt positioned on Lt elbow to incr weightbearing through Lt UE; pt unable to push up to midline; max multimodal cues throughout shoulders to bring head up into midline; pt very kyphotic   Special needs/care consideration  BiPAP/CPAP No  CPM No  Continuous Drip IV 0.9% NS 50 ml/hr  Dialysis No  Life Vest No  Oxygen No  Special Bed No  Trach Size No  Wound Vac (area) No  Skin Left hip surgical incision  Bowel mgmt: Last BM 03/27/14  Bladder mgmt: Urinary catheter  Diabetic mgmt No   Previous Home Environment  Living Arrangements: Children  Available Help at Discharge: Family;Available PRN/intermittently  Type of Home: House  Home Layout: One level  Home Access: Level  entry  Home Care Services: No  Additional Comments: pt has a son that lives with her. she states "i am suppose to use a walker" pt reports when she fell she was not using the walker  Discharge Living Setting  Plans for Discharge Living Setting: Patient's home;House;Lives with (comment) (Son [name] lives with patient.)  Type of Home at Discharge: House  Discharge Home Layout: One level  Discharge Home Access: Stairs to enter  Entrance Stairs-Number of Steps: 1 small step at entrance  Does the patient have any problems obtaining your medications?: No  Social/Family/Support Systems  Patient Roles: Parent (Has 2 sons.)  Contact Information: Antonisha Waskey - son (h) [phone] (c) [phone]  Anticipated Caregiver: Lynne Logan - son  Anticipated Caregiver's Contact Information: [name] [phone]  Ability/Limitations of Caregiver: Son [name] does not work. Barrie Folk works and lives 1 1/2 miles away  Caregiver Availability: 24/7  Discharge Plan Discussed with Primary Caregiver: Yes  Is Caregiver In Agreement with Plan?: Yes  Does  Caregiver/Family have Issues with Lodging/Transportation while Pt is in Rehab?: No  Goals/Additional Needs  Patient/Family Goal for Rehab: PT/OT min/mod assist, ST supervision goals  Expected length of stay: 22-30 days  Cultural Considerations: Christian  Dietary Needs: Dys 1, honey thick liquids  Equipment Needs: TBD  Pt/Family Agrees to Admission and willing to participate: Yes  Program Orientation Provided & Reviewed with Pt/Caregiver Including Roles & Responsibilities: Yes  Decrease burden of Care through IP rehab admission: N/A  Possible need for SNF placement upon discharge: Yes, if patient does not progress to point where son can manage at home.  Patient Condition: This patient's condition remains as documented in the consult dated 03/28/14, in which the Rehabilitation Physician determined and documented that the patient's condition is appropriate for  intensive rehabilitative care in an inpatient rehabilitation facility. Will admit to inpatient rehab today.  Preadmission Screen Completed By: Retta Diones, 03/28/2014 2:39 PM  ______________________________________________________________________  Discussed status with Dr. Letta Pate on 03/28/14 at 1438 and received telephone approval for admission today.  Admission Coordinator: Retta Diones, time1438/Date09/17/15  Cosigned by: Charlett Blake, MD [03/28/2014 3:17 PM]

## 2014-03-29 NOTE — Evaluation (Signed)
Speech Language Pathology Assessment and Plan  Patient Details  Name: Erin [name] MRN: [id number] Date of Birth: 28-Jul-1929  SLP Diagnosis: Cognitive Impairments;Dysphagia  Rehab Potential: Fair ELOS: 14-21 days     Today's Date: 03/29/2014 SLP Individual Time: 1694-5038 SLP Individual Time Calculation (min): 60 min   Problem List:  Patient Active Problem List   Diagnosis Date Noted  . CVA (cerebral infarction) 03/28/2014  . Cerebral embolism with cerebral infarction 03/23/2014  . Preoperative cardiovascular examination 03/21/2014  . Fracture, intertrochanteric, left femur 03/20/2014  . Fall at home 03/20/2014  . Hyperlipidemia with target LDL less than 100 02/16/2014  . Need for vaccination with 13-polyvalent pneumococcal conjugate vaccine 02/14/2014  . Acute cystitis 02/14/2014  . Allergic rhinitis 10/15/2013  . Insomnia 10/15/2013  . Polyneuropathy in other diseases classified elsewhere 10/03/2013  . Forgetfulness 08/05/2013  . Swelling of both ankles 06/25/2013  . Leg swelling 06/25/2013  . Acute pancreatitis 06/17/2013  . Chronic atrial fibrillation 04/09/2013  . Atypical chest pain 04/03/2013  . DNR (do not resuscitate) discussion 02/13/2013  . DNR (do not resuscitate) 02/13/2013  . Hearing loss 02/13/2013  . Muscle weakness (generalized) 11/24/2012  . Left shoulder pain 11/13/2012  . Unspecified constipation 09/05/2012  . H/O falling 08/01/2012  . Routine general medical examination at a health care facility 04/16/2012  . FH: colon cancer 02/23/2012  . Memory loss 11/08/2011  . Other general symptoms 11/08/2011  . Other vitamin B12 deficiency anemia 11/08/2011  . Essential and other specified forms of tremor 11/08/2011  . Unsteady gait 10/14/2011  . Osteoporosis, senile   . Prediabetes 03/16/2011  . BURSITIS, RIGHT KNEE 01/05/2010  . SPINAL STENOSIS, LUMBAR 11/20/2009  . SCIATICA 11/13/2009  . OSTEOARTHRITIS, KNEE, RIGHT 09/11/2009  . UNSTEADY GAIT  09/11/2009  . MEDIAL MENISCUS TEAR, RIGHT 09/11/2009  . Essential hypertension, benign 01/02/2009  . GERD 01/02/2009  . HEMORRHAGE OF RECTUM AND ANUS 01/02/2009  . PAIN IN JOINT, HAND 11/20/2008  . Pain in joint, pelvic region and thigh 11/20/2008  . KNEE PAIN 11/20/2008  . ARTHRITIS, LEFT FOOT 07/17/2007   Past Medical History:  Past Medical History  Diagnosis Date  . Anxiety   . Arthritis   . Hyperlipidemia   . Essential hypertension, benign   . Osteoporosis   . GERD (gastroesophageal reflux disease)   . Hearing loss   . Restless leg   . Tremor   . Leg edema   . Obese   . Gall bladder disease   . Cataract   . Carpal tunnel syndrome of left wrist   . Atrial fibrillation   . Sleep apnea   . Esophageal dilatation   . Frequent falls   . Parkinson disease   . History of nuclear stress test 08/31/2012    Lexiscan cardiolite negative for ischemia  . Polyneuropathy in other diseases classified elsewhere 10/03/2013   Past Surgical History:  Past Surgical History  Procedure Laterality Date  . Appendectomy    . Cholecystectomy    . Abdominal hysterectomy    . Fracture surgery      Left arm x4  . Benign cyst removed from kidney and lung, bilateral mastectomy    . Bilateral great toenail removal    . Knee arthroscopy  2012    Right knee, Dr Aline Brochure  . Left forearm    . Breast surgery      Bilateral 2000  . Tonsillectomy    . R hand surgery    . Bronchoscopy  02/15/2000  .  Right vats.  "  . Right upper lobe wedge resection.  "  . Upper gastrointestinal endoscopy  11/25/1999    Esophagitis/ Normal proximal esophagus, stomach and duodenum  . Colonoscopy  01/17/2009    Dr. Deatra Ina : Internal hemorrhoids/Diverticula, scattered in the ascending colon/  Moderate diverticulosis ascending colon to sigmoid colon  . Esophagogastroduodenoscopy   04/23/2003    Dr. Deatra Ina: HIATAL HERNIA  . Colonoscopy  01/16/2001    Normal  . Cataract extraction, bilateral    .  Esophagogastroduodenoscopy  03/20/2012    JTT:[id number] ring-LIKELY CAUSING MILD DYSPHAGIA/SMALL hiatal hernia/Multiple sessile polyps ranging between 3-38m , path benign  . Cataract extraction    . Carpal tunnel release      Left wrist  . Transthoracic echocardiogram  06/24/2009    EF=>55%, mild assymetric LVH; LA mildly dilated; mild mitral annular calcif, borderline MVP, mild-mod MR; mild-mod TR, RV systolic pressure elevated, mild pulm HTN; AV mildly sclerotic; mild pulm valve regurg - ordered r/t bradycardia   . Endovenous ablation saphenous vein w/ laser  01/2011    Right GSV  . Cholecystectomy      Assessment / Plan / Recommendation Clinical Impression   Erin [name] a 80y.o. right-handed female with history of questionable Parkinson's disease followed by Erin [name] frequent falls, atrial fibrillation on no anti-coagulation except aspirin due to frequent falls. Patient lives with family  and was still driving. Presented 03/20/2014 after mechanical fall when she lost her balance landing on her left hip without loss of consciousness [facility] noted to be confused with some left-sided weakness. Cranial CT scan showed right parietal lobe distribution of the right middle cerebral artery consistent with acute to subacute ischemia. CTA head and neck consistent with acute infarct right basal ganglia right parietal white matter. [facility] 1 [facility] Patient was admitted for comprehensive rehabilitation program 03/28/2014.  SLP evaluation completed on 03/29/2014 with the following results:  Pt presents with decreased alertness and sustained attention which impacts all other higher levels of cognitive functioning.  Pt was oriented and able to follow basic 1-2 step commands in a functional context; however, she required constant cuing for alertness throughout the evaluation.  Difficult to objectively determine presence of left inattention  despite pt presenting with right gaze preference and difficulty visually scanning past midline due to pt with right head tilt which, per report, is baseline from a previous fall with clavicle fracture.    Pt's alertness, in addition to poor posturing with right head tilt and left labial lingual weakness, directly impacts her swallowing function.  Pt with no overt s/s of aspiration with presentations of her currently prescribed diet; however, she required maximal cuing to attend to boluses of purees and honey thick liquids and presented with a suspected delayed swallow initiation.  No trials of thin liquid or advanced solid consistencies attempted on this date secondary to lethargy.   Pt would benefit from skilled ST while inpatient in order to maximize functional independence and reduce burden of care upon discharge.    Skilled Therapeutic Interventions          Cognitive-linguistic and bedside swallowing evaluation completed with results and recommendations reviewed with pt.     SLP Assessment  Patient will need skilled Speech Lanaguage Pathology Services during CIR admission    Recommendations  Diet Recommendations: Dysphagia 1 (Puree);Honey-thick liquid Liquid Administration via: Cup;Spoon Medication Administration: Crushed with puree Supervision: Full supervision/cueing for compensatory  strategies;Staff to assist with self feeding Compensations: Check for anterior loss;Multiple dry swallows after each bite/sip;Slow rate;Small sips/bites Postural Changes and/or Swallow Maneuvers: Seated upright 90 degrees;Upright 30-60 min after meal Oral Care Recommendations: Oral care BID Patient destination:  (TBD) Follow up Recommendations: 24 hour supervision/assistance;Home Health SLP;Skilled Nursing facility Equipment Recommended: None recommended by SLP    SLP Frequency 5 out of 7 days   SLP Treatment/Interventions Oral motor exercises;Patient/family education;Functional tasks;Cueing  hierarchy;Dysphagia/aspiration precaution training;Cognitive remediation/compensation;Internal/external aids;Speech/Language facilitation    Pain Pain Assessment Pain Assessment: No/denies pain  Prior Functioning Cognitive/Linguistic Baseline: Information not available Type of Home: House  Lives With: Son Available Help at Discharge: Family;Available PRN/intermittently Vocation: Retired  Industrial/product designer Term Goals: Week 1: SLP Short Term Goal 1 (Week 1): Pt will utilize recommended swallowing precautions when consuming her currently prescribed diet with min assist  SLP Short Term Goal 2 (Week 1): Pt will improve alertness to sustain attention for 5-7 minutes with min assist.  SLP Short Term Goal 3 (Week 1): Pt will complete oral motor and pharyngeal strengthening exercises with min assist.  SLP Short Term Goal 4 (Week 1): Pt will use external aids to facilitate improved recall of daily information for 75% accuracy with min assist.   See FIM for current functional status Refer to Care Plan for Long Term Goals  Recommendations for other services: None  Discharge Criteria: Patient will be discharged from SLP if patient refuses treatment 3 consecutive times without medical reason, if treatment goals not met, if there is a change in medical status, if patient makes no progress towards goals or if patient is discharged from hospital.  The above assessment, treatment plan, treatment alternatives and goals were discussed and mutually agreed upon: No family available/patient unable  Windell Moulding, M.A. CCC-SLP  Jerlyn Pain, Selinda Orion 03/29/2014, 12:59 PM

## 2014-03-29 NOTE — Progress Notes (Signed)
80 y.o. right-handed female with history of question Parkinson's disease followed by Dr. Jannifer Franklin, frequent falls, atrial fibrillation on no anti-coagulation except aspirin due to frequent falls. Patient lives with family use and occasional cane walker when needed and was still driving. Presented 03/20/2014 after mechanical fall when she lost her balance landing on her left hip without loss of consciousness and was admitted to an [facility]. X-rays and imaging revealed a left intertrochanteric hip fracture. Patient noted to be confused with some left-sided weakness. Cranial CT scan showed right parietal lobe distribution of the right middle cerebral artery consistent with acute to subacute ischemia. CTA head and neck consistent with acute infarct right basal ganglia right parietal white matter. High-grade stenosis versus occlusion of distal right M1 segment. Neurology consulted. Patient did not receive TPA. Echocardiogram with ejection fraction of 60% no wall motion abnormalities. Venous Doppler studies lower extremities negative for DVT.  Subjective/Complaints:   Objective: Vital Signs: Blood pressure 142/65, pulse 60, temperature 99.1 F (37.3 C), temperature source Oral, resp. rate 18, weight 74.39 kg (164 lb), SpO2 99.00%. No results found. Results for orders placed during the hospital encounter of 03/28/14 (from the past 72 hour(s))  CBC WITH DIFFERENTIAL     Status: Abnormal   Collection Time    03/29/14  5:37 AM      Result Value Ref Range   WBC 8.8  4.0 - 10.5 K/uL   RBC 2.80 (*) 3.87 - 5.11 MIL/uL   Hemoglobin 8.9 (*) 12.0 - 15.0 g/dL   HCT 27.4 (*) 36.0 - 46.0 %   MCV 97.9  78.0 - 100.0 fL   MCH 31.8  26.0 - 34.0 pg   MCHC 32.5  30.0 - 36.0 g/dL   RDW 13.3  11.5 - 15.5 %   Platelets 245  150 - 400 K/uL   Neutrophils Relative % 73  43 - 77 %   Neutro Abs 6.5  1.7 - 7.7 K/uL   Lymphocytes Relative 18  12 - 46 %   Lymphs Abs 1.6  0.7 - 4.0 K/uL   Monocytes Relative 7  3 - 12 %   Monocytes  Absolute 0.6  0.1 - 1.0 K/uL   Eosinophils Relative 2  0 - 5 %   Eosinophils Absolute 0.1  0.0 - 0.7 K/uL   Basophils Relative 0  0 - 1 %   Basophils Absolute 0.0  0.0 - 0.1 K/uL  COMPREHENSIVE METABOLIC PANEL     Status: Abnormal   Collection Time    03/29/14  5:37 AM      Result Value Ref Range   Sodium 144  137 - 147 mEq/L   Potassium 4.1  3.7 - 5.3 mEq/L   Chloride 107  96 - 112 mEq/L   CO2 25  19 - 32 mEq/L   Glucose, Bld 109 (*) 70 - 99 mg/dL   BUN 16  6 - 23 mg/dL   Creatinine, Ser 0.43 (*) 0.50 - 1.10 mg/dL   Calcium 7.9 (*) 8.4 - 10.5 mg/dL   Total Protein 4.7 (*) 6.0 - 8.3 g/dL   Albumin 2.0 (*) 3.5 - 5.2 g/dL   AST 22  0 - 37 U/L   ALT 12  0 - 35 U/L   Alkaline Phosphatase 62  39 - 117 U/L   Total Bilirubin 0.5  0.3 - 1.2 mg/dL   GFR calc non Af Amer >90  >90 mL/min   GFR calc Af Amer >90  >90 mL/min   Comment: (  NOTE)     The eGFR has been calculated using the CKD EPI equation.     This calculation has not been validated in all clinical situations.     eGFR's persistently <90 mL/min signify possible Chronic Kidney     Disease.   Anion gap 12  5 - 15      Physical Exam  Eyes:  Pupils reactive to light  Neck: Neck supple. No thyromegaly present.  Cardiovascular:  Cardiac rate controlled  Respiratory: Breath sounds normal. No respiratory distress.  GI: Soft. Bowel sounds are normal. She exhibits no distension.  Neurological:  Speech is dysarthric. Left central 7 and tongue deviation. Pleasantly confused but was able to provide her name age. She can provide the hospital subtle cues. Followed simple commands. Limited awareness of her deficits. She does have a right gaze preference. LUE is tr to 2/5 deltoid, bicep, tricep 0/5 Grip. LLE limited by pain. Does 1 KE and ADF.APF. Can differentiate between painful and light touch. Normal strength on the right side  Skin:  Her hip incision is not tender, small amt ecchymosis lateral thigh   Assessment/Plan: 1.  Functional deficits secondary to left hemiparesis from right CVA as well as left intertrochanteric hip fracture status post ORIF 03/25/2014 which require 3+ hours per day of interdisciplinary therapy in a comprehensive inpatient rehab setting. Physiatrist is providing close team supervision and 24 hour management of active medical problems listed below. Physiatrist and rehab team continue to assess barriers to discharge/monitor patient progress toward functional and medical goals. FIM:                   Comprehension Comprehension Mode: Auditory Comprehension: 5-Follows basic conversation/direction: With extra time/assistive device  Expression Expression Mode: Verbal Expression: 5-Expresses basic needs/ideas: With no assist  Social Interaction Social Interaction: 4-Interacts appropriately 75 - 89% of the time - Needs redirection for appropriate language or to initiate interaction.  Problem Solving Problem Solving: 2-Solves basic 25 - 49% of the time - needs direction more than half the time to initiate, plan or complete simple activities  Memory Memory: 3-Recognizes or recalls 50 - 74% of the time/requires cueing 25 - 49% of the time Medical Problem List and Plan:  1. Functional deficits secondary to an embolic right parietal basal ganglia infarcts as well as left intertrochanteric hip fracture. Status post ORIF 03/25/2014. Weightbearing as tolerated  2. DVT Prophylaxis/Anticoagulation: Subcutaneous heparin. Monitor platelet counts any signs of bleeding. Venous Doppler studies negative  3. Pain Management: Oxycodone as needed. Monitor with increased mobility  4. Dysphagia. Dysphagia 1 honey thick liquids. Monitor hydration. Followup speech therapy  5. Neuropsych: This patient is not capable of making decisions on her own behalf.  6. Skin/Wound Care: Routine skin checks  7. Hypertension/atrial fibrillation. Lopressor 25 mg 3 times a day. Cardiac rate control 8. Hyperlipidemia.  Lipitor  9. GERD. Protonix    LOS (Days) 1 A FACE TO FACE EVALUATION WAS PERFORMED  Erin [name] E 03/29/2014, 10:38 AM

## 2014-03-29 NOTE — Progress Notes (Signed)
Physical Medicine and Rehabilitation Consult  Reason for Consult: Left intertrochanteric hip fracture/Parkinson disease  Referring Physician: Dr.Xu  HPI: Erin [name] is a 80 y.o. right-handed female with history of question Parkinson's disease followed by Dr. Jannifer Franklin, frequent falls, atrial fibrillation on no anti-coagulation except aspirin. Patient lives with family use and occasional cane walker when needed and was still driving. Presented 03/20/2014 after mechanical fall when she lost her balance landing on her left hip without loss of consciousness and was admitted to an [facility]. X-rays and imaging revealed a left intertrochanteric hip fracture. Patient noted to be confused with some left-sided weakness. Cranial CT scan showed right parietal lobe distribution of the right middle cerebral artery consistent with acute to subacute ischemia. CTA head and neck consistent with acute infarct right basal ganglia right parietal white matter. High-grade stenosis versus occlusion of distal right M1 segment. Neurology consulted. Patient did not receive TPA. Echocardiogram with ejection fraction of 60% no wall motion abnormalities. Venous Doppler studies lower extremities negative for DVT. Preoperative clearance per cardiology services underwent ORIF of intertrochanteric hip fracture 03/25/2013 per Dr.Xu. Weightbearing as tolerated left lower extremity. Placed on subcutaneous heparin for DVT prophylaxis. Aspirin EC 80 for CVA prophylaxis. Dysphagia 1 honey thick liquid diet. Occupational therapy evaluation completed 03/27/2014 with recommendations for physical medicine rehabilitation consult.  Review of Systems  HENT: Positive for hearing loss.  Cardiovascular: Positive for palpitations.  Gastrointestinal:  GERD  Musculoskeletal: Positive for falls.  Neurological: Positive for tremors.  Psychiatric/Behavioral:  Anxiety  All other systems reviewed and are negative.   Past Medical History   Diagnosis  Date   .   Anxiety    .  Arthritis    .  Hyperlipidemia    .  Essential hypertension, benign    .  Osteoporosis    .  GERD (gastroesophageal reflux disease)    .  Hearing loss    .  Restless leg    .  Tremor    .  Leg edema    .  Obese    .  Gall bladder disease    .  Cataract    .  Carpal tunnel syndrome of left wrist    .  Atrial fibrillation    .  Sleep apnea    .  Esophageal dilatation    .  Frequent falls    .  Parkinson disease    .  History of nuclear stress test  08/31/2012     Lexiscan cardiolite negative for ischemia   .  Polyneuropathy in other diseases classified elsewhere  10/03/2013    Past Surgical History   Procedure  Laterality  Date   .  Appendectomy     .  Cholecystectomy     .  Abdominal hysterectomy     .  Fracture surgery       Left arm x4   .  Benign cyst removed from kidney and lung, bilateral mastectomy     .  Bilateral great toenail removal     .  Knee arthroscopy   2012     Right knee, Dr Aline Brochure   .  Left forearm     .  Breast surgery       Bilateral 2000   .  Tonsillectomy     .  R hand surgery     .  Bronchoscopy   02/15/2000   .  Right vats.   "   .  Right upper lobe wedge resection.   "   .  Upper gastrointestinal endoscopy   11/25/1999     Esophagitis/ Normal proximal esophagus, stomach and duodenum   .  Colonoscopy   01/17/2009     Dr. Deatra Ina : Internal hemorrhoids/Diverticula, scattered in the ascending colon/ Moderate diverticulosis ascending colon to sigmoid colon   .  Esophagogastroduodenoscopy   04/23/2003     Dr. Deatra Ina: HIATAL HERNIA   .  Colonoscopy   01/16/2001     Normal   .  Cataract extraction, bilateral     .  Esophagogastroduodenoscopy   03/20/2012     KVQ:QVZDGLOV ring-LIKELY CAUSING MILD DYSPHAGIA/SMALL hiatal hernia/Multiple sessile polyps ranging between 3-35mm , path benign   .  Cataract extraction     .  Carpal tunnel release       Left wrist   .  Transthoracic echocardiogram   06/24/2009     EF=>55%, mild assymetric  LVH; LA mildly dilated; mild mitral annular calcif, borderline MVP, mild-mod MR; mild-mod TR, RV systolic pressure elevated, mild pulm HTN; AV mildly sclerotic; mild pulm valve regurg - ordered r/t bradycardia   .  Endovenous ablation saphenous vein w/ laser   01/2011     Right GSV   .  Cholecystectomy      Family History   Problem  Relation  Age of Onset   .  Diabetes  Mother    .  Heart disease  Mother    .  Stroke  Mother    .  Cancer  Sister      KIDNEY   .  Emphysema  Sister    .  Heart disease  Brother    .  Heart disease  Sister    .  Emphysema  Sister    .  Cancer  Sister      BREAST   .  Heart disease  Brother    .  Colon cancer  Sister    .  Alzheimer's disease  Father    .  Heart disease  Sister    .  Tremor  Sister    .  Tremor  Brother    .  Alcoholism  Child    .  Cancer  Brother    .  Pancreatic cancer  Brother    .  Diabetes  Son     Social History: reports that she has never smoked. She has never used smokeless tobacco. She reports that she does not drink alcohol or use illicit drugs.  Allergies:  Allergies   Allergen  Reactions   .  Codeine  Hives   .  Morphine And Related  Itching and Other (See Comments)     Makes pt hallucinate   .  Actonel [Risedronate Sodium]  Other (See Comments)     Abdominal pain   .  Fosamax [Alendronate Sodium]  Other (See Comments)     Abdominal pian   .  Losartan  Other (See Comments)     Hospitalized in 06/2013 with pancreatitis, losartan is associated with increased risk of pancreatitis , Hence discontinued   .  Nitrofurantoin  Other (See Comments)     Hospitalized with pancreatitis in 06/2013. Nitrofurantoin implicated as a possible cause    Medications Prior to Admission   Medication  Sig  Dispense  Refill   .  amLODipine (NORVASC) 5 MG tablet  Take 5 mg by mouth daily.     [name]  aspirin EC 81 MG tablet  Take 81 mg by mouth every other day.     [name]  cholecalciferol (VITAMIN D-400) 400 UNITS TABS tablet  Take 400 Units by  mouth daily.     [name]  gabapentin (NEURONTIN) 100 MG capsule  Take 1 capsule (100 mg total) by mouth at bedtime.  30 capsule  5   .  lovastatin (MEVACOR) 20 MG tablet  Take 1 tablet (20 mg total) by mouth at bedtime.  30 tablet  5   .  magnesium gluconate (MAGONATE) 500 MG tablet  Take 500 mg by mouth 2 (two) times daily.     .  metoprolol succinate (TOPROL-XL) 50 MG 24 hr tablet  Take 50 mg by mouth daily. Take with or immediately following a meal.     .  potassium chloride SA (K-DUR,KLOR-CON) 20 MEQ tablet  Take 1 tablet by mouth daily on days when lasix is taken  14 tablet  0   .  potassium chloride SA (K-DUR,KLOR-CON) 20 MEQ tablet  Take 20 mEq by mouth daily.     [name]  topiramate (TOPAMAX) 25 MG tablet  Take 25-50 mg by mouth 2 (two) times daily. Patient takes 1 tablet in the morning and 2 tablets at bedtime.     .  furosemide (LASIX) 20 MG tablet  Take 1 tablet (20 mg total) by mouth daily as needed.  14 tablet  0    Home:  Home Living  Family/patient expects to be discharged to:: Skilled nursing facility  Living Arrangements: Children  Available Help at Discharge: Family;Available PRN/intermittently  Type of Home: House  Home Access: Level entry  Home Layout: One level  Home Equipment: Walker - 2 wheels;Cane - single point;Bedside commode  Additional Comments: pt has a son that lives with her. she states "i am suppose to use a walker" pt reports when she fell she was not using the walker  Functional History:  Prior Function  Level of Independence: Independent with assistive device(s)  Gait / Transfers Assistance Needed: uses RW  Comments: reports getting up from chair was difficult before at home  Functional Status:  Mobility:  Bed Mobility  Overal bed mobility: Needs Assistance;+2 for physical assistance;+ 2 for safety/equipment  Bed Mobility: Supine to Sit;Sit to Supine  Supine to sit: +2 for physical assistance;Max assist  Sit to supine: +2 for physical assistance;Max assist   General bed mobility comments: pt exiting the bed on the right side and initiates with RT UE  Transfers  Overall transfer level: Needs assistance  Equipment used: Rolling walker (2 wheeled)  Transfers: Sit to/from Stand  Sit to Stand: +2 physical assistance;Max assist  General transfer comment: bed elevated and cues for upright postuer    ADL:  ADL  Overall ADL's : Needs assistance/impaired  Grooming: Wash/dry face;Moderate assistance;Sitting  Grooming Details (indicate cue type and reason): (A) for sitting balance  Toilet Transfer: +2 for physical assistance;Maximal assistance;Stand-pivot  General ADL Comments: Pt currently requires bed elevated for sit<>stand but able to complete x2. Pt oriented and reports personal errors that resulted in a fall. pt perseverating on the need to return to therapy  Cognition:  Cognition  Overall Cognitive Status: Within Functional Limits for tasks assessed  Orientation Level: Oriented to person;Oriented to situation;Oriented to place;Disoriented to time  Cognition  Arousal/Alertness: Awake/alert  Behavior During Therapy: WFL for tasks assessed/performed  Overall Cognitive Status: Within Functional Limits for tasks assessed  Blood pressure 116/53, pulse 93, temperature 98.2 F (36.8 C), temperature source Axillary, resp. rate 18, height 5\' 6"  (1.676 m), weight 71.714 kg (158 lb 1.6 oz), SpO2  96.00%.  Physical Exam  Eyes:  Pupils reactive to light  Neck: Neck supple. No thyromegaly present.  Cardiovascular:  Cardiac rate controlled  Respiratory: Breath sounds normal. No respiratory distress.  GI: Soft. Bowel sounds are normal. She exhibits no distension.  Neurological:  Speech is dysarthric. Left central 7 and tongue deviation. Pleasantly confused but was able to provide her name age. She can provide the hospital subtle cues. Followed simple commands. Limited awareness of her deficits. She does have a right gaze preference. LUE is tr to 1/5  deltoid, bicept, tricep Hi. LLE limited by pain. Does 1 KE and ADF.APF. Can differentiate between painful and light touch.  Skin:  Her hip incision is dressed appropriately tender   Results for orders placed during the hospital encounter of 03/20/14 (from the past 24 hour(s))   CBC Status: Abnormal    Collection Time    03/27/14 6:15 AM   Result  Value  Ref Range    WBC  8.6  4.0 - 10.5 K/uL    RBC  2.90 (*)  3.87 - 5.11 MIL/uL    Hemoglobin  9.4 (*)  12.0 - 15.0 g/dL    HCT  28.2 (*)  36.0 - 46.0 %    MCV  97.2  78.0 - 100.0 fL    MCH  32.4  26.0 - 34.0 pg    MCHC  33.3  30.0 - 36.0 g/dL    RDW  13.4  11.5 - 15.5 %    Platelets  218  150 - 400 K/uL   BASIC METABOLIC PANEL Status: Abnormal    Collection Time    03/27/14 6:15 AM   Result  Value  Ref Range    Sodium  140  137 - 147 mEq/L    Potassium  3.8  3.7 - 5.3 mEq/L    Chloride  108  96 - 112 mEq/L    CO2  23  19 - 32 mEq/L    Glucose, Bld  119 (*)  70 - 99 mg/dL    BUN  18  6 - 23 mg/dL    Creatinine, Ser  0.46 (*)  0.50 - 1.10 mg/dL    Calcium  7.9 (*)  8.4 - 10.5 mg/dL    GFR calc non Af Amer  >90  >90 mL/min    GFR calc Af Amer  >90  >90 mL/min    Anion gap  9  5 - 15    Dg Femur Left  03/25/2014 CLINICAL DATA: Left hip fracture fixation. EXAM: LEFT FEMUR - 2 VIEW; DG C-ARM 61-120 MIN COMPARISON: 03/20/2014 FINDINGS: There is a gamma nail in the femur with 2 proximal dynamic hip screws transfixing the comminuted intertrochanteric fracture. No complicating features. IMPRESSION: Internal fixation of intertrochanteric fracture with anatomic reduction Electronically Signed By: Kalman Jewels M.D. On: 03/25/2014 16:28  Dg C-arm 1-60 Min  03/25/2014 CLINICAL DATA: Left hip fracture fixation. EXAM: LEFT FEMUR - 2 VIEW; DG C-ARM 61-120 MIN COMPARISON: 03/20/2014 FINDINGS: There is a gamma nail in the femur with 2 proximal dynamic hip screws transfixing the comminuted intertrochanteric fracture. No complicating features. IMPRESSION:  Internal fixation of intertrochanteric fracture with anatomic reduction Electronically Signed By: Kalman Jewels M.D. On: 03/25/2014 16:28   Assessment/Plan:  1. Diagnosis: Right parietal/BG infarct with left IT hip fx 2. Does the need for close, 24 hr/day medical supervision in concert with the patient's rehab needs make it unreasonable for this patient to be served in a less intensive setting?  Yes 3. Co-Morbidities requiring supervision/potential complications: afib, htn, PD 4. Due to bladder management, bowel management, safety, skin/wound care, disease management, medication administration, pain management and patient education, does the patient require 24 hr/day rehab nursing? Yes 5. Does the patient require coordinated care of a physician, rehab nurse, PT (1-2 hrs/day, 5 days/week), OT (1-2 hrs/day, 5 days/week) and SLP (1-2 hrs/day, 5 days/week) to address physical and functional deficits in the context of the above medical diagnosis(es)? Yes Addressing deficits in the following areas: balance, endurance, locomotion, strength, transferring, bowel/bladder control, bathing, dressing, feeding, grooming, toileting, cognition, speech, language, swallowing and psychosocial support 6. Can the patient actively participate in an intensive therapy program of at least 3 hrs of therapy per day at least 5 days per week? Yes 7. The potential for patient to make measurable gains while on inpatient rehab is excellent 8. Anticipated functional outcomes upon discharge from inpatient rehab are min assist and mod assist with PT, min assist and mod assist with OT, supervision with SLP. 9. Estimated rehab length of stay to reach the above functional goals is: 22-30 days 10. Does the patient have adequate social supports to accommodate these discharge functional goals? Yes 11. Anticipated D/C setting: Home 12. Anticipated post D/C treatments: HH therapy, Outpatient therapy and Home excercise program 13. Overall  Rehab/Functional Prognosis: good RECOMMENDATIONS:  This patient's condition is appropriate for continued rehabilitative care in the following setting: CIR  Patient has agreed to participate in recommended program. Yes  Note that insurance prior authorization may be required for reimbursement for recommended care.  Comment: Pt is motivated to participate. She was independent and driving before hospitalization. Family appears supportive. Son says he will move in with her after dc from hospital. Rehab Admissions Coordinator to follow up.  Thanks,  Meredith Staggers, MD, Mellody Drown  03/27/2014  Revision History...      Date/Time User Action    03/28/2014 10:00 AM Meredith Staggers, MD Sign    03/27/2014 3:04 PM Cathlyn Parsons, PA-C [place]   View Details Report    Routing History.Marland KitchenMarland Kitchen

## 2014-03-29 NOTE — Evaluation (Signed)
Occupational Therapy Assessment and Plan  Patient Details  Name: Erin [name] MRN: [id number] Date of Birth: [birth year]  OT Diagnosis: abnormal posture, acute pain, hemiplegia affecting non-dominant side and pain in joint Rehab Potential: Rehab Potential: Good ELOS: 3-4 weeks   Today's Date: 03/29/2014 OT Individual Time: 1030-1130 OT Individual Time Calculation (min): 60 min     Problem List:  Patient Active Problem List   Diagnosis Date Noted  . CVA (cerebral infarction) 03/28/2014  . Cerebral embolism with cerebral infarction 03/23/2014  . Preoperative cardiovascular examination 03/21/2014  . Fracture, intertrochanteric, left femur 03/20/2014  . Fall at home 03/20/2014  . Hyperlipidemia with target LDL less than 100 02/16/2014  . Need for vaccination with 13-polyvalent pneumococcal conjugate vaccine 02/14/2014  . Acute cystitis 02/14/2014  . Allergic rhinitis 10/15/2013  . Insomnia 10/15/2013  . Polyneuropathy in other diseases classified elsewhere 10/03/2013  . Forgetfulness 08/05/2013  . Swelling of both ankles 06/25/2013  . Leg swelling 06/25/2013  . Acute pancreatitis 06/17/2013  . Chronic atrial fibrillation 04/09/2013  . Atypical chest pain 04/03/2013  . DNR (do not resuscitate) discussion 02/13/2013  . DNR (do not resuscitate) 02/13/2013  . Hearing loss 02/13/2013  . Muscle weakness (generalized) 11/24/2012  . Left shoulder pain 11/13/2012  . Unspecified constipation 09/05/2012  . H/O falling 08/01/2012  . Routine general medical examination at a health care facility 04/16/2012  . FH: colon cancer 02/23/2012  . Memory loss 11/08/2011  . Other general symptoms 11/08/2011  . Other vitamin B12 deficiency anemia 11/08/2011  . Essential and other specified forms of tremor 11/08/2011  . Unsteady gait 10/14/2011  . Osteoporosis, senile   . Prediabetes 03/16/2011  . BURSITIS, RIGHT KNEE 01/05/2010  . SPINAL STENOSIS, LUMBAR 11/20/2009  . SCIATICA 11/13/2009  .  OSTEOARTHRITIS, KNEE, RIGHT 09/11/2009  . UNSTEADY GAIT 09/11/2009  . MEDIAL MENISCUS TEAR, RIGHT 09/11/2009  . Essential hypertension, benign 01/02/2009  . GERD 01/02/2009  . HEMORRHAGE OF RECTUM AND ANUS 01/02/2009  . PAIN IN JOINT, HAND 11/20/2008  . Pain in joint, pelvic region and thigh 11/20/2008  . KNEE PAIN 11/20/2008  . ARTHRITIS, LEFT FOOT 07/17/2007    Past Medical History:  Past Medical History  Diagnosis Date  . Anxiety   . Arthritis   . Hyperlipidemia   . Essential hypertension, benign   . Osteoporosis   . GERD (gastroesophageal reflux disease)   . Hearing loss   . Restless leg   . Tremor   . Leg edema   . Obese   . Gall bladder disease   . Cataract   . Carpal tunnel syndrome of left wrist   . Atrial fibrillation   . Sleep apnea   . Esophageal dilatation   . Frequent falls   . Parkinson disease   . History of nuclear stress test 08/31/2012    Lexiscan cardiolite negative for ischemia  . Polyneuropathy in other diseases classified elsewhere 10/03/2013   Past Surgical History:  Past Surgical History  Procedure Laterality Date  . Appendectomy    . Cholecystectomy    . Abdominal hysterectomy    . Fracture surgery      Left arm x4  . Benign cyst removed from kidney and lung, bilateral mastectomy    . Bilateral great toenail removal    . Knee arthroscopy  2012    Right knee, Dr Aline Brochure  . Left forearm    . Breast surgery      Bilateral 2000  . Tonsillectomy    .  R hand surgery    . Bronchoscopy  02/15/2000  . Right vats.  "  . Right upper lobe wedge resection.  "  . Upper gastrointestinal endoscopy  11/25/1999    Esophagitis/ Normal proximal esophagus, stomach and duodenum  . Colonoscopy  01/17/2009    Dr. Deatra Ina : Internal hemorrhoids/Diverticula, scattered in the ascending colon/  Moderate diverticulosis ascending colon to sigmoid colon  . Esophagogastroduodenoscopy   04/23/2003    Dr. Deatra Ina: HIATAL HERNIA  . Colonoscopy  01/16/2001    Normal   . Cataract extraction, bilateral    . Esophagogastroduodenoscopy  03/20/2012    CHE:NIDPOEUM ring-LIKELY CAUSING MILD DYSPHAGIA/SMALL hiatal hernia/Multiple sessile polyps ranging between 3-70m , path benign  . Cataract extraction    . Carpal tunnel release      Left wrist  . Transthoracic echocardiogram  06/24/2009    EF=>55%, mild assymetric LVH; LA mildly dilated; mild mitral annular calcif, borderline MVP, mild-mod MR; mild-mod TR, RV systolic pressure elevated, mild pulm HTN; AV mildly sclerotic; mild pulm valve regurg - ordered r/t bradycardia   . Endovenous ablation saphenous vein w/ laser  01/2011    Right GSV  . Cholecystectomy      Assessment & Plan Clinical Impression: Patient is a 80y.o. year old right-handed female with history of question Parkinson's disease followed by Dr. WJannifer Franklin frequent falls, atrial fibrillation on no anti-coagulation except aspirin. Patient lives with family use and occasional cane walker when needed and was still driving. Presented 03/20/2014 after mechanical fall when she lost her balance landing on her left hip without loss of consciousness [facility] revealed a left intertrochanteric hip fracture. Patient noted to be confused with some left-sided weakness. Cranial CT scan showed right parietal lobe distribution of the right middle cerebral artery consistent with acute to subacute ischemia. CTA head and neck consistent with acute infarct right basal ganglia right parietal white matter. High-grade stenosis versus occlusion of distal right M1 segment. Neurology consulted. Patient did not receive TPA. Echocardiogram with ejection fraction of 60% no wall motion abnormalities. Venous Doppler studies lower extremities negative for DVT. Preoperative clearance per cardiology services underwent ORIF of intertrochanteric hip fracture 03/25/2013 per Dr.Xu. Weightbearing as tolerated left lower extremity. Placed on subcutaneous heparin  for DVT prophylaxis. Aspirin EC 80 for CVA prophylaxis. Dysphagia 1 honey thick liquid diet. Occupational therapy evaluation completed 03/27/2014 with recommendations for physical medicine rehabilitation consult.  Patient transferred to CIR on 03/28/2014.    Patient currently requires max assist with basic self-care skills secondary to muscle weakness and unbalanced muscle activation and decreased coordination.  Prior to hospitalization, patient could complete BADL with supervision.  Patient will benefit from skilled intervention to increase independence with basic self-care skills prior to discharge home with care partner.  Anticipate patient will require minimal physical assistance and follow up home health. OT - End of Session Activity Tolerance: Tolerates 10 - 20 min activity with multiple rests Endurance Deficit: Yes OT Assessment Rehab Potential: Good OT Patient demonstrates impairments in the following area(s): Balance;Safety;Endurance;Pain;Motor OT Basic ADL's Functional Problem(s): Eating;Grooming;Bathing;Dressing;Toileting OT Transfers Functional Problem(s): Toilet;Tub/Shower OT Additional Impairment(s): Fuctional Use of Upper Extremity (L-UE) OT Plan OT Intensity: Minimum of 1-2 x/day, 45 to 90 minutes OT Frequency: 5 out of 7 days OT Duration/Estimated Length of Stay: 3-4 weeks OT Treatment/Interventions: UE/LE Coordination activities;Therapeutic Exercise;UE/LE Strength taining/ROM;Neuromuscular re-education;DME/adaptive equipment instruction;Discharge planning;Balance/vestibular training;Pain management;Self Care/advanced ADL retraining;Therapeutic Activities;Patient/family education;Functional mobility training OT Self Feeding Anticipated Outcome(s): Supervision OT  Basic Self-Care Anticipated Outcome(s): Min Assist OT Toileting Anticipated Outcome(s): Min Assist OT Bathroom Transfers Anticipated Outcome(s): Min Assist OT Recommendation Patient destination: Home Follow Up  Recommendations: Home health OT Equipment Recommended: To be determined  Skilled Therapeutic Intervention OT 1:1 initial evaluation with treatment emphasizing pt/family ed on rehab methods and goals (2 sons, 1 dtr-in-law, 1 friend of family), bed mobility, activity tolerance and adapted bathing/dressing.   Pt received supine in bed, alert and responsive when cued although reporting severe fatigue with poor sleep history during admission.   Pt could only tolerate max assist to rise to sitting at edge of bed and static sitting (supported by RUE using bed rail) for 3-5 min before complaining of left LE (groin) pain and fatigue with need for return to bed.   Pt rested briefly and participated in supine bathing with max assist.   Pt demo'd poor sitting posture at edge of bed d/t kyphosis with left hemiplegia (although some proximal movement at shoulder and elbow).    Family receptive to cues to minimize assisting pt during eval although very interactive and unintentionally distracting during assessment.  OT Evaluation Precautions/Restrictions  Precautions Precautions: Fall Precaution Comments: subluxation of LT UE noted; per family and patient, muscle wasting at LUE d/t prior injury (fall w/fracture) Restrictions Weight Bearing Restrictions: Yes LLE Weight Bearing: Weight bearing as tolerated  General Chart Reviewed: Yes Family/Caregiver Present: Yes  Vital Signs Therapy Vitals Temp: 98.9 F (37.2 C) Temp src: Oral Pulse Rate: 77 Resp: 18 BP: 138/72 mmHg Patient Position (if appropriate): Lying Oxygen Therapy SpO2: 95 % O2 Device: None (Room air)  Pain Pain Assessment Pain Assessment: Faces Faces Pain Scale: Hurts whole lot Pain Type: Acute pain Pain Location: Groin Pain Orientation: Left;Proximal Pain Descriptors / Indicators: Burning Pain Onset: With Activity Patients Stated Pain Goal: 3 Pain Intervention(s): Medication (See eMAR);Repositioned Multiple Pain Sites: No  Home  Living/Prior Functioning Home Living Living Arrangements: Children Available Help at Discharge: Family;Available PRN/intermittently Type of Home: House Home Access: Level entry Home Layout: One level  Lives With: Son (youngest son, [name]) Prior Function Level of Independence: Independent with basic ADLs;Requires assistive device for independence;Other (comment) (Mod I using cane or walker; however, son reports that pt does not consistently use devices for mobility) Driving: Yes Vocation: Retired   ADL ADL ADL Comments: see FIM  Vision/Perception  Vision- History Patient Visual Report: Eye fatigue/eye pain/headache   Cognition Overall Cognitive Status: Difficult to assess Arousal/Alertness: Suspect due to medications (lethargic, pain with LLE movement) Orientation Level: Oriented X4 Attention: Selective Selective Attention: Appears intact Memory: Appears intact Awareness: Appears intact Problem Solving: Impaired Problem Solving Impairment: Functional basic Safety/Judgment: Appears intact  Sensation Sensation Light Touch: Appears Intact Stereognosis: Impaired Detail Stereognosis Impaired Details: Impaired LLE;Impaired LUE Hot/Cold: Appears Intact Proprioception: Impaired Detail Proprioception Impaired Details: Impaired LLE;Impaired LUE Coordination Gross Motor Movements are Fluid and Coordinated: No Fine Motor Movements are Fluid and Coordinated: No Coordination and Movement Description: Impaired LLE & LUE  Motor  Motor Motor: Hemiplegia Motor - Skilled Clinical Observations: Proximal weakness, left hemiplegia  Mobility  Bed Mobility Bed Mobility: Rolling Left;Supine to Sit;Scooting to HOB;Sitting - Scoot to Edge of Bed;Sit to Supine Rolling Left: With rail;1: +1 Total assist Rolling Left: Patient Percentage: 10% Rolling Left Details: Tactile cues for initiation;Verbal cues for technique;Tactile cues for sequencing;Verbal cues for sequencing Supine to Sit: 1:  +2 Total assist;HOB flat Supine to Sit: Patient Percentage: 10% Supine to Sit Details: Tactile cues for initiation;Tactile cues for  sequencing;Verbal cues for sequencing;Verbal cues for technique;Manual facilitation for placement Sitting - Scoot to Edge of Bed: 1: +1 Total assist Sitting - Scoot to Edge of Bed Details: Manual facilitation for weight shifting;Visual cues/gestures for sequencing;Verbal cues for technique;Tactile cues for sequencing;Manual facilitation for placement;Verbal cues for sequencing;Tactile cues for initiation Sit to Supine: HOB flat;1: +2 Total assist Sit to Supine: Patient Percentage: 10% Sit to Supine - Details: Tactile cues for initiation;Verbal cues for technique;Tactile cues for sequencing;Verbal cues for sequencing;Manual facilitation for placement Scooting to [facility]: 1: +2 Total assist Scooting to [facility]: Patient Percentage: 0%   Trunk/Postural Assessment  Cervical Assessment Cervical Assessment: Exceptions to [facility] (premorbid mild head lean/tilt toward right per family) Thoracic Assessment Thoracic Assessment: Exceptions to [facility] (significant kyphosis; hx of osteoporosis) Lumbar Assessment Lumbar Assessment: Within Functional Limits Postural Control Postural Control: Deficits on evaluation Head Control: left neglect/inattention, head tilt and turn to right Trunk Control: impaired, with hx of kyphosis and posterior pelvic tilt   Balance Balance Balance Assessed: Yes Static Sitting Balance Static Sitting - Balance Support: Right upper extremity supported;Feet supported Static Sitting - Level of Assistance: 4: Min assist Static Sitting - Comment/# of Minutes: significant kyphosis, able to shift weight from right to left when cued.  Extremity/Trunk Assessment RUE Assessment RUE Assessment: Exceptions to [facility] RUE AROM (degrees) Overall AROM Right Upper Extremity: Due to premorbid status (limited from prior fall w/clavicle fx although [facility] for ADL) RUE Strength RUE  Overall Strength: Deficits;Due to premorbid status (4-/5) LUE Assessment LUE Assessment: Exceptions to WFL LUE AROM (degrees) Overall AROM Left Upper Extremity: Deficits (left hemiplegia) LUE Strength LUE Overall Strength: Deficits (Left hemiplegia although with proximal activation, shoulder>elbow)  FIM:  FIM - Eating Eating Activity: 0: Activity did not occur FIM - Grooming Grooming Steps: Wash, rinse, dry face Grooming: 2: Patient completes 1 of 4 or 2 of 5 steps FIM - Bathing Bathing Steps Patient Completed: Chest;Left Arm;Abdomen;Front perineal area Bathing: 2: Max-Patient completes 3-4 50f10 parts or 25-49% FIM - Upper Body Dressing/Undressing Upper body dressing/undressing: 0: Wears gown/pajamas-no public clothing FIM - Lower Body Dressing/Undressing Lower body dressing/undressing: 0: Wears gInterior and spatial designerFIM - BControl and instrumentation engineerDevices: HOB elevated;Bed rails Bed/Chair Transfer: 1: Supine > Sit: Total A (helper does all/Pt. < 25%);0: Activity did not occur FIM - TAir cabin crewTransfers: 0-Activity did not occur FIM - Tub/Shower Transfers Tub/shower Transfers: 0-Activity did not occur or was simulated   Refer to Care Plan for Long Term Goals  Recommendations for other services: None  Discharge Criteria: Patient will be discharged from OT if patient refuses treatment 3 consecutive times without medical reason, if treatment goals not met, if there is a change in medical status, if patient makes no progress towards goals or if patient is discharged from hospital.  The above assessment, treatment plan, treatment alternatives and goals were discussed and mutually agreed upon: by patient and by family  [facility]9/18/2015, 3:36 PM

## 2014-03-29 NOTE — Progress Notes (Signed)
INITIAL NUTRITION ASSESSMENT  DOCUMENTATION CODES Per approved criteria  -Not Applicable   INTERVENTION: Provide Magic cup TID between meals, each supplement provides 290 kcal and 9 grams of protein.  Encouraged PO intake.  NUTRITION DIAGNOSIS: Inadequate oral intake related to dislike of food as evidenced by meal completion of 0-50%.   Goal: Pt to meet >/= 90% of their estimated nutrition needs   Monitor:  PO intake, weight trends, labs, I/O's  Reason for Assessment: MST  80 y.o. female  Admitting Dx: CVA  ASSESSMENT: Pt with history of question Parkinson's disease followed by Dr. Jannifer Franklin, frequent falls, atrial fibrillation. Presented 03/20/2014 after mechanical fall. X-rays and imaging revealed a left intertrochanteric hip fracture. Cranial CT scan showed right parietal lobe distribution of the right middle cerebral artery consistent with acute to subacute ischemia.  Pt reports having a good appetite, however meal completion is 0-50%. Family reports pt may not like her dysphagia 1 diet as she would like to have "not mushy foods". Family reports prior to hospitalization pt was eating great with no difficulties. Pt reports she has not lost weight with her usual body weight of 157 lbs. Pt refused supplements,however is willing to consume Magic cup. Will order. Pt was encouraged to eat her food at meals to obtain adequate nutrition.   Pt with no observed significant fat or muscle mass loss.  Labs: Low creatinine, calcium.  Height: Ht Readings from Last 1 Encounters:  03/20/14 5\' 6"  (1.676 m)    Weight: Wt Readings from Last 1 Encounters:  03/28/14 164 lb (74.39 kg)    Ideal Body Weight: 130 lbs  % Ideal Body Weight: 126%  Wt Readings from Last 10 Encounters:  03/28/14 164 lb (74.39 kg)  03/20/14 158 lb 1.6 oz (71.714 kg)  03/20/14 158 lb 1.6 oz (71.714 kg)  03/20/14 158 lb 1.6 oz (71.714 kg)  02/14/14 159 lb 1.3 oz (72.158 kg)  01/23/14 159 lb 8 oz (72.349 kg)   10/15/13 167 lb 0.6 oz (75.769 kg)  10/03/13 166 lb (75.297 kg)  07/18/13 170 lb 11.2 oz (77.429 kg)  07/17/13 171 lb 1.3 oz (77.601 kg)   Usual Body Weight: 157 lbs  % Usual Body Weight: 104%  BMI:  Body mass index is 26.48 kg/(m^2).  Estimated Nutritional Needs: Kcal: 1700-1900 Protein: 70-80 grams Fluid: 1.7 - 1.9 L/day  Skin: incision left leg and left hip, non-pitting LLE edema  Diet Order: Dysphagia 1 with honey thick liquids  EDUCATION NEEDS: -No education needs identified at this time   Intake/Output Summary (Last 24 hours) at 03/29/14 0955 Last data filed at 03/29/14 0600  Gross per 24 hour  Intake     50 ml  Output    425 ml  Net   -375 ml    Last BM: 9/17  Labs:   Recent Labs Lab 03/27/14 0615 03/28/14 0430 03/29/14 0537  NA 140 143 144  K 3.8 4.1 4.1  CL 108 109 107  CO2 23 23 25   BUN 18 20 16   CREATININE 0.46* 0.50 0.43*  CALCIUM 7.9* 7.8* 7.9*  GLUCOSE 119* 107* 109*    CBG (last 3)  No results found for this basename: GLUCAP,  in the last 72 hours  Scheduled Meds: . aspirin  325 mg Oral Daily  . atorvastatin  40 mg Oral q1800  . heparin  5,000 Units Subcutaneous 3 times per day  . metoprolol tartrate  25 mg Oral TID  . pantoprazole  40 mg Oral Daily  .  senna-docusate  1 tablet Oral BID    Continuous Infusions: . sodium chloride      Past Medical History  Diagnosis Date  . Anxiety   . Arthritis   . Hyperlipidemia   . Essential hypertension, benign   . Osteoporosis   . GERD (gastroesophageal reflux disease)   . Hearing loss   . Restless leg   . Tremor   . Leg edema   . Obese   . Gall bladder disease   . Cataract   . Carpal tunnel syndrome of left wrist   . Atrial fibrillation   . Sleep apnea   . Esophageal dilatation   . Frequent falls   . Parkinson disease   . History of nuclear stress test 08/31/2012    Lexiscan cardiolite negative for ischemia  . Polyneuropathy in other diseases classified elsewhere 10/03/2013     Past Surgical History  Procedure Laterality Date  . Appendectomy    . Cholecystectomy    . Abdominal hysterectomy    . Fracture surgery      Left arm x4  . Benign cyst removed from kidney and lung, bilateral mastectomy    . Bilateral great toenail removal    . Knee arthroscopy  2012    Right knee, Dr Aline Brochure  . Left forearm    . Breast surgery      Bilateral 2000  . Tonsillectomy    . R hand surgery    . Bronchoscopy  02/15/2000  . Right vats.  "  . Right upper lobe wedge resection.  "  . Upper gastrointestinal endoscopy  11/25/1999    Esophagitis/ Normal proximal esophagus, stomach and duodenum  . Colonoscopy  01/17/2009    Dr. Deatra Ina : Internal hemorrhoids/Diverticula, scattered in the ascending colon/  Moderate diverticulosis ascending colon to sigmoid colon  . Esophagogastroduodenoscopy   04/23/2003    Dr. Deatra Ina: HIATAL HERNIA  . Colonoscopy  01/16/2001    Normal  . Cataract extraction, bilateral    . Esophagogastroduodenoscopy  03/20/2012    VEL:FYBOFBPZ ring-LIKELY CAUSING MILD DYSPHAGIA/SMALL hiatal hernia/Multiple sessile polyps ranging between 3-13mm , path benign  . Cataract extraction    . Carpal tunnel release      Left wrist  . Transthoracic echocardiogram  06/24/2009    EF=>55%, mild assymetric LVH; LA mildly dilated; mild mitral annular calcif, borderline MVP, mild-mod MR; mild-mod TR, RV systolic pressure elevated, mild pulm HTN; AV mildly sclerotic; mild pulm valve regurg - ordered r/t bradycardia   . Endovenous ablation saphenous vein w/ laser  01/2011    Right GSV  . Cholecystectomy      Kallie Locks, MS, Provisional LDN Pager # [phone] After hours/ weekend pager # [phone]

## 2014-03-29 NOTE — Care Management Note (Signed)
[facility] Individual Statement of Services  Patient Name:  Erin [name]  Date:  03/29/2014  Welcome to the [facility].  Our goal is to provide you with an individualized program based on your diagnosis and situation, designed to meet your specific needs.  With this comprehensive rehabilitation program, you will be expected to participate in at least 3 hours of rehabilitation therapies Monday-Friday, with modified therapy programming on the weekends.  Your rehabilitation program will include the following services:  Physical Therapy (PT), Occupational Therapy (OT), Speech Therapy (ST), 24 hour per day rehabilitation nursing, Case Management (Social Worker), Rehabilitation Medicine, Nutrition Services and Pharmacy Services  Weekly team conferences will be held on Wednesday to discuss your progress.  Your Social Worker will talk with you frequently to get your input and to update you on team discussions.  Team conferences with you and your family in attendance may also be held.  Expected length of stay: 21-28 days  Overall anticipated outcome: min/mod level of assist  Depending on your progress and recovery, your program may change. Your Social Worker will coordinate services and will keep you informed of any changes. Your Social Worker's name and contact numbers are listed  below.  The following services may also be recommended but are not provided by the [facility] will be made to provide these services after discharge if needed.  Arrangements include referral to agencies that provide these services.  Your insurance has been verified to be:  [place] primary doctor is:  Tula Nakayama  Pertinent information will be shared with your doctor and your insurance company.  Social  Worker:  Ovidio Kin, [place] or ([phone]  Information discussed with and copy given to patient by: Elease Hashimoto, 03/29/2014, 10:25 AM

## 2014-03-29 NOTE — Evaluation (Signed)
Physical Therapy Assessment and Plan  Patient Details  Name: Erin [name] MRN: [id number] Date of Birth: Nov 24, 1929  PT Diagnosis: Abnormal posture, Abnormality of gait, Hemiplegia non-dominant, Impaired cognition, Impaired sensation, Muscle weakness and Pain in joint Rehab Potential: Fair ELOS: 21-28 days   Today's Date: 03/29/2014 PT Individual Time: 1300-1408 PT Individual Time Calculation (min): 68 min    Problem List:  Patient Active Problem List   Diagnosis Date Noted  . CVA (cerebral infarction) 03/28/2014  . Cerebral embolism with cerebral infarction 03/23/2014  . Preoperative cardiovascular examination 03/21/2014  . Fracture, intertrochanteric, left femur 03/20/2014  . Fall at home 03/20/2014  . Hyperlipidemia with target LDL less than 100 02/16/2014  . Need for vaccination with 13-polyvalent pneumococcal conjugate vaccine 02/14/2014  . Acute cystitis 02/14/2014  . Allergic rhinitis 10/15/2013  . Insomnia 10/15/2013  . Polyneuropathy in other diseases classified elsewhere 10/03/2013  . Forgetfulness 08/05/2013  . Swelling of both ankles 06/25/2013  . Leg swelling 06/25/2013  . Acute pancreatitis 06/17/2013  . Chronic atrial fibrillation 04/09/2013  . Atypical chest pain 04/03/2013  . DNR (do not resuscitate) discussion 02/13/2013  . DNR (do not resuscitate) 02/13/2013  . Hearing loss 02/13/2013  . Muscle weakness (generalized) 11/24/2012  . Left shoulder pain 11/13/2012  . Unspecified constipation 09/05/2012  . H/O falling 08/01/2012  . Routine general medical examination at a health care facility 04/16/2012  . FH: colon cancer 02/23/2012  . Memory loss 11/08/2011  . Other general symptoms 11/08/2011  . Other vitamin B12 deficiency anemia 11/08/2011  . Essential and other specified forms of tremor 11/08/2011  . Unsteady gait 10/14/2011  . Osteoporosis, senile   . Prediabetes 03/16/2011  . BURSITIS, RIGHT KNEE 01/05/2010  . SPINAL STENOSIS, LUMBAR  11/20/2009  . SCIATICA 11/13/2009  . OSTEOARTHRITIS, KNEE, RIGHT 09/11/2009  . UNSTEADY GAIT 09/11/2009  . MEDIAL MENISCUS TEAR, RIGHT 09/11/2009  . Essential hypertension, benign 01/02/2009  . GERD 01/02/2009  . HEMORRHAGE OF RECTUM AND ANUS 01/02/2009  . PAIN IN JOINT, HAND 11/20/2008  . Pain in joint, pelvic region and thigh 11/20/2008  . KNEE PAIN 11/20/2008  . ARTHRITIS, LEFT FOOT 07/17/2007    Past Medical History:  Past Medical History  Diagnosis Date  . Anxiety   . Arthritis   . Hyperlipidemia   . Essential hypertension, benign   . Osteoporosis   . GERD (gastroesophageal reflux disease)   . Hearing loss   . Restless leg   . Tremor   . Leg edema   . Obese   . Gall bladder disease   . Cataract   . Carpal tunnel syndrome of left wrist   . Atrial fibrillation   . Sleep apnea   . Esophageal dilatation   . Frequent falls   . Parkinson disease   . History of nuclear stress test 08/31/2012    Lexiscan cardiolite negative for ischemia  . Polyneuropathy in other diseases classified elsewhere 10/03/2013   Past Surgical History:  Past Surgical History  Procedure Laterality Date  . Appendectomy    . Cholecystectomy    . Abdominal hysterectomy    . Fracture surgery      Left arm x4  . Benign cyst removed from kidney and lung, bilateral mastectomy    . Bilateral great toenail removal    . Knee arthroscopy  2012    Right knee, Dr Aline Brochure  . Left forearm    . Breast surgery      Bilateral 2000  . Tonsillectomy    .  R hand surgery    . Bronchoscopy  02/15/2000  . Right vats.  "  . Right upper lobe wedge resection.  "  . Upper gastrointestinal endoscopy  11/25/1999    Esophagitis/ Normal proximal esophagus, stomach and duodenum  . Colonoscopy  01/17/2009    Dr. Deatra Ina : Internal hemorrhoids/Diverticula, scattered in the ascending colon/  Moderate diverticulosis ascending colon to sigmoid colon  . Esophagogastroduodenoscopy   04/23/2003    Dr. Deatra Ina: HIATAL HERNIA   . Colonoscopy  01/16/2001    Normal  . Cataract extraction, bilateral    . Esophagogastroduodenoscopy  03/20/2012    NTI:RWERXVQM ring-LIKELY CAUSING MILD DYSPHAGIA/SMALL hiatal hernia/Multiple sessile polyps ranging between 3-17mm , path benign  . Cataract extraction    . Carpal tunnel release      Left wrist  . Transthoracic echocardiogram  06/24/2009    EF=>55%, mild assymetric LVH; LA mildly dilated; mild mitral annular calcif, borderline MVP, mild-mod MR; mild-mod TR, RV systolic pressure elevated, mild pulm HTN; AV mildly sclerotic; mild pulm valve regurg - ordered r/t bradycardia   . Endovenous ablation saphenous vein w/ laser  01/2011    Right GSV  . Cholecystectomy     Assessment & Plan Clinical Impression: Erin [name] is a 80 y.o. right-handed female with history of question Parkinson's disease followed by Dr. Jannifer Franklin, frequent falls, atrial fibrillation on no anti-coagulation except aspirin due to frequent falls. Patient lives with family use and occasional cane walker when needed and was still driving. Presented 03/20/2014 after mechanical fall when she lost her balance landing on her left hip without loss of consciousness and was admitted to an [facility]. X-rays and imaging revealed a left intertrochanteric hip fracture. Patient noted to be confused with some left-sided weakness. Cranial CT scan showed right parietal lobe distribution of the right middle cerebral artery consistent with acute to subacute ischemia. CTA head and neck consistent with acute infarct right basal ganglia right parietal white matter. High-grade stenosis versus occlusion of distal right M1 segment. Neurology consulted. Patient did not receive TPA. Echocardiogram with ejection fraction of 60% no wall motion abnormalities. Venous Doppler studies lower extremities negative for DVT. Preoperative clearance per cardiology services underwent ORIF of intertrochanteric hip fracture 03/25/2013 per Dr.Xu. Weightbearing as  tolerated left lower extremity. Placed on subcutaneous heparin for DVT prophylaxis. Aspirin EC 80 for CVA prophylaxis. Dysphagia 1 honey thick liquid diet. Physical and Occupational therapy evaluations completed 03/27/2014 with recommendations for physical medicine rehabilitation consult. Patient was admitted for comprehensive rehabilitation program. Patient transferred to CIR on 03/28/2014 .   Patient currently requires +2 total with mobility secondary to muscle weakness, decreased cardiorespiratoy endurance, impaired timing and sequencing, unbalanced muscle activation and decreased motor planning, decreased midline orientation and decreased attention to left, decreased initiation, decreased attention, decreased memory and delayed processing and decreased sitting balance, decreased standing balance, decreased postural control, hemiplegia and decreased balance strategies.  Prior to hospitalization, patient was modified independent  with mobility and lived with Son (youngest son, [name]) in a House home.  Home access is  Level entry.  Patient will benefit from skilled PT intervention to maximize safe functional mobility and minimize fall risk for planned discharge home with 24 hour assist.  Anticipate patient will benefit from follow up [facility] at discharge.  PT - End of Session Activity Tolerance: Tolerates 30+ min activity with multiple rests Endurance Deficit: Yes Endurance Deficit Description: Pt's eyes closed for >75% of PT evaluation. Activity tolerance limited by pain in L hip.  PT Assessment Rehab Potential: Fair Barriers to Discharge: Decreased caregiver support;Other (comment) (Son (who lives with patient) reports history of knee pain/injury) PT Patient demonstrates impairments in the following area(s): Balance;Endurance;Motor;Pain;Perception;Safety;Sensory PT Transfers Functional Problem(s): Bed Mobility;Bed to Chair;Car;Furniture PT Locomotion Functional Problem(s): Ambulation;Wheelchair  Mobility PT Plan PT Intensity: Minimum of 1-2 x/day ,45 to 90 minutes PT Frequency: 5 out of 7 days PT Duration Estimated Length of Stay: 21-28 days PT Treatment/Interventions: Ambulation/gait training;Balance/vestibular training;Cognitive remediation/compensation;Discharge planning;DME/adaptive equipment instruction;Disease management/prevention;Functional mobility training;Neuromuscular re-education;Splinting/orthotics;UE/LE Coordination activities;UE/LE Strength taining/ROM;Therapeutic Exercise;Patient/family education;Therapeutic Activities;Wheelchair propulsion/positioning;Visual/perceptual remediation/compensation;Pain management PT Transfers Anticipated Outcome(s): Supervision-Min A PT Locomotion Anticipated Outcome(s): Min-Mod A PT Recommendation Recommendations for Other Services: Other (comment) (None at this time) Follow Up Recommendations: Home health PT;Skilled nursing facility;24 hour supervision/assistance Patient destination: Home (Home vs. SNF pending pt progress) Equipment Recommended: To be determined Equipment Details: Pt owns personal cane and walker  Skilled Therapeutic Intervention PT evaluation performed; see below for detailed findings. Treatment initiated. Session focused on functional transfers and static sitting balance. Pt sat EOB x8 minutes with bilat LE support and RUE support at bed rail requiring max-total A. Pt performed squat pivot transfer from bed>w/c (to L side) with +2A and lateral scooting transfer from w/c>bed (to R side) with slide board and +2A. See below for further detail regarding assist/cueing required during sitting balance and functional transfers. Educated pt/family on findings, goals, and plan of care. Pt/family verbalized understanding and were in full agreement with plan of care.  PT Evaluation Precautions/Restrictions Precautions Precautions: Fall Precaution Comments: subluxation of LT UE noted; per family and patient, muscle wasting at LUE  d/t prior injury (fall w/fracture) Restrictions Weight Bearing Restrictions: Yes LLE Weight Bearing: Weight bearing as tolerated General   Vital SignsTherapy Vitals Temp: 98.9 F (37.2 C) Temp src: Oral Pulse Rate: 77 Resp: 18 BP: 138/72 mmHg Patient Position (if appropriate): Lying Oxygen Therapy SpO2: 95 % O2 Device: None (Room air) Pain Pain Assessment Pain Assessment: Faces Pain Score: 8  Faces Pain Scale: Hurts whole lot Pain Type: Acute pain Pain Location: Groin Pain Orientation: Left;Proximal Pain Descriptors / Indicators: Burning Pain Onset: With Activity Patients Stated Pain Goal: 3 Pain Intervention(s): Medication (See eMAR) Multiple Pain Sites: No Home Living/Prior Functioning Home Living Available Help at Discharge: Family;Available PRN/intermittently Type of Home: House Home Access: Level entry Home Layout: One level  Lives With: Son (youngest son, [name]) Prior Function Level of Independence: Independent with basic ADLs;Requires assistive device for independence;Other (comment) (Mod I using cane or walker; however, son reports that pt does not consistently use devices for mobility) Driving: Yes Vocation: Retired Tax adviser Overall Cognitive Status: Difficult to assess Arousal/Alertness: Lethargic Orientation Level: Oriented X4 Attention: Sustained Sustained Attention: Impaired Sustained Attention Impairment: Functional basic;Verbal basic Selective Attention: Appears intact Memory: Impaired Memory Impairment: Storage deficit;Retrieval deficit Awareness: Appears intact Problem Solving: Impaired Problem Solving Impairment: Functional basic Executive Function: Initiating Initiating: Impaired Initiating Impairment: Functional basic;Verbal basic Safety/Judgment: Appears intact Sensation Sensation Light Touch: Appears Intact Stereognosis: Impaired Detail Stereognosis Impaired Details: Impaired LLE;Impaired LUE Hot/Cold:  Appears Intact Proprioception: Impaired Detail Proprioception Impaired Details: Impaired LLE;Impaired LUE Coordination Gross Motor Movements are Fluid and Coordinated: No Fine Motor Movements are Fluid and Coordinated: No Coordination and Movement Description: Impaired LLE & LUE Motor  Motor Motor: Hemiplegia Motor - Skilled Clinical Observations: Proximal weakness, left hemiplegia  Mobility Bed Mobility Bed Mobility: Rolling Left;Supine to Sit;Scooting to HOB;Sitting - Scoot to Edge of Bed;Sit to Supine Rolling Left: With  rail;1: +1 Total assist Rolling Left: Patient Percentage: 10% Rolling Left Details: Tactile cues for initiation;Verbal cues for technique;Tactile cues for sequencing;Verbal cues for sequencing Supine to Sit: 1: +2 Total assist;HOB flat Supine to Sit: Patient Percentage: 10% Supine to Sit Details: Tactile cues for initiation;Tactile cues for sequencing;Verbal cues for sequencing;Verbal cues for technique;Manual facilitation for placement Sitting - Scoot to Edge of Bed: 1: +1 Total assist Sitting - Scoot to Edge of Bed Details: Manual facilitation for weight shifting;Visual cues/gestures for sequencing;Verbal cues for technique;Tactile cues for sequencing;Manual facilitation for placement;Verbal cues for sequencing;Tactile cues for initiation Sit to Supine: HOB flat;1: +2 Total assist Sit to Supine: Patient Percentage: 10% Sit to Supine - Details: Tactile cues for initiation;Verbal cues for technique;Tactile cues for sequencing;Verbal cues for sequencing;Manual facilitation for placement Scooting to HOB: 1: +2 Total assist Scooting to [facility]: Patient Percentage: 0% Transfers Transfers: Yes Squat Pivot Transfers: With armrests;1: +2 Total assist Squat Pivot Transfer Details: Tactile cues for initiation;Manual facilitation for weight shifting;Verbal cues for technique;Manual facilitation for placement;Verbal cues for sequencing;Tactile cues for sequencing Lateral/Scoot  Transfers: 1: +2 Total assist;With slide board;With armrests removed Lateral/Scoot Transfer Details: Tactile cues for initiation;Tactile cues for sequencing;Manual facilitation for weight shifting;Verbal cues for technique;Manual facilitation for placement;Verbal cues for sequencing Locomotion  Ambulation Ambulation: No (Unsafe at this time) Gait Gait: No (Unsafe at this time) Stairs / Additional Locomotion Stairs: No (Unsafe at this time) Wheelchair Mobility Wheelchair Mobility: No  Trunk/Postural Assessment  Cervical Assessment Cervical Assessment: Exceptions to [facility] (premorbid mild head lean/tilt toward right per family) Thoracic Assessment Thoracic Assessment: Exceptions to [facility] (significant kyphosis; hx of osteoporosis) Lumbar Assessment Lumbar Assessment: Within Functional Limits Postural Control Postural Control: Deficits on evaluation Head Control: left neglect/inattention, head tilt and turn to right Trunk Control: impaired, with hx of kyphosis and posterior pelvic tilt  Balance Balance Balance Assessed: Yes Static Sitting Balance Static Sitting - Balance Support: Right upper extremity supported;Feet supported Static Sitting - Level of Assistance: 2: Max assist;1: +1 Total assist Static Sitting - Comment/# of Minutes: Seated EOB x8 minutes required max-Total A due to decreased arousal, significant lateral trunk lean to R side and tendeny toward posterior LOB. Extremity Assessment  RUE Assessment RUE Assessment: Exceptions to [facility] RUE AROM (degrees) Overall AROM Right Upper Extremity: Due to premorbid status (limited from prior fall w/clavicle fx although [facility] for ADL) RUE Strength RUE Overall Strength: Deficits;Due to premorbid status (4-/5) LUE Assessment LUE Assessment: Exceptions to [facility] LUE AROM (degrees) Overall AROM Left Upper Extremity: Deficits (left hemiplegia) LUE Strength LUE Overall Strength: Deficits (Left hemiplegia although with proximal activation,  shoulder>elbow) RLE Assessment RLE Assessment: Exceptions to [facility] RLE Strength RLE Overall Strength: Deficits RLE Overall Strength Comments: Grossly 4-/5 to 4/5  LLE Assessment LLE Assessment: Exceptions to [facility] LLE Strength LLE Overall Strength: Unable to assess;Due to pain  FIM:  FIM - Control and instrumentation engineer Devices: Sliding board;Arm rests Bed/Chair Transfer: 1: Two helpers FIM - Locomotion: Wheelchair Locomotion: Wheelchair: 1: Total Assistance/staff pushes wheelchair (Pt<25%) FIM - Locomotion: Ambulation Locomotion: Ambulation: 0: Activity did not occur (Unsafe at this time) FIM - Locomotion: Stairs Locomotion: Stairs: 0: Activity did not occur (Unsafe at this time)   Refer to Care Plan for Long Term Goals  Recommendations for other services: None  Discharge Criteria: Patient will be discharged from PT if patient refuses treatment 3 consecutive times without medical reason, if treatment goals not met, if there is a change in medical status, if patient makes  no progress towards goals or if patient is discharged from hospital.  The above assessment, treatment plan, treatment alternatives and goals were discussed and mutually agreed upon: by patient and by family  Stefano Gaul 03/29/2014, 5:13 PM

## 2014-03-29 NOTE — Interval H&P Note (Signed)
Erin [name] was admitted today to Inpatient Rehabilitation with the diagnosis of Left hemiparesis and hip fracture.  The patient's history has been reviewed, patient examined, and there is no change in status.  Patient continues to be appropriate for intensive inpatient rehabilitation.  I have reviewed the patient's chart and labs.  Questions were answered to the patient's satisfaction.  [name] 03/29/2014, 8:07 AM

## 2014-03-29 NOTE — Progress Notes (Signed)
Patient information reviewed and entered into e[facility] System by Becky Lorre Opdahl, covering PPS coordinator. Information including medical coding and functional independence measure will be reviewed and updated through discharge.  Per nursing, patient was given "Data Collection Information Summary for Patients in Inpatient Rehabilitation Facilities with attached Privacy Act Statement Health Care Records" upon admission.     

## 2014-03-29 NOTE — H&P (View-Only) (Signed)
Physical Medicine and Rehabilitation Admission H&P      Chief Complaint   Patient presents with   .  Hip Pain    : HPI: Erin [name] is a 80 y.o. right-handed female with history of question Parkinson's disease followed by Dr. Jannifer Franklin, frequent falls, atrial fibrillation on no anti-coagulation except aspirin due to frequent falls. Patient lives with family use and occasional cane walker when needed and was still driving. Presented 03/20/2014 after mechanical fall when she lost her balance landing on her left hip without loss of consciousness and was admitted to an [facility]. X-rays and imaging revealed a left intertrochanteric hip fracture. Patient noted to be confused with some left-sided weakness. Cranial CT scan showed right parietal lobe distribution of the right middle cerebral artery consistent with acute to subacute ischemia. CTA head and neck consistent with acute infarct right basal ganglia right parietal white matter. High-grade stenosis versus occlusion of distal right M1 segment. Neurology consulted. Patient did not receive TPA. Echocardiogram with ejection fraction of 60% no wall motion abnormalities. Venous Doppler studies lower extremities negative for DVT. Preoperative clearance per cardiology services underwent ORIF of intertrochanteric hip fracture 03/25/2013 per Dr.Xu. Weightbearing as tolerated left lower extremity. Placed on subcutaneous heparin for DVT prophylaxis. Aspirin EC 80 for CVA prophylaxis. Dysphagia 1 honey thick liquid diet. Physical and Occupational therapy evaluations completed 03/27/2014 with recommendations for physical medicine rehabilitation consult. Patient was admitted for comprehensive rehabilitation program  Patient states she like to stay in her other room Complains of hip pain needed cueing to remind her she has a left hip fracture, she then remembered that she had plate and screws in her hip. Patient recalls the name of her cardiologist as well as her  orthopedist Patient does not recall that she's had a stroke but remembers with cueing   ROS Review of Systems   HENT: Positive for hearing loss.   Cardiovascular: Positive for palpitations.   Gastrointestinal:   GERD   Musculoskeletal: Positive for falls.   Neurological: Positive for tremors.   Psychiatric/Behavioral:   Anxiety   All other systems reviewed and are negative    Past Medical History   Diagnosis  Date   .  Anxiety     .  Arthritis     .  Hyperlipidemia     .  Essential hypertension, benign     .  Osteoporosis     .  GERD (gastroesophageal reflux disease)     .  Hearing loss     .  Restless leg     .  Tremor     .  Leg edema     .  Obese     .  Gall bladder disease     .  Cataract     .  Carpal tunnel syndrome of left wrist     .  Atrial fibrillation     .  Sleep apnea     .  Esophageal dilatation     .  Frequent falls     .  Parkinson disease     .  History of nuclear stress test  08/31/2012       Lexiscan cardiolite negative for ischemia   .  Polyneuropathy in other diseases classified elsewhere  10/03/2013    Past Surgical History   Procedure  Laterality  Date   .  Appendectomy       .  Cholecystectomy       .  Abdominal hysterectomy       .  Fracture surgery           Left arm x4   .  Benign cyst removed from kidney and lung, bilateral mastectomy       .  Bilateral great toenail removal       .  Knee arthroscopy    2012       Right knee, Dr Aline Brochure   .  Left forearm       .  Breast surgery           Bilateral 2000   .  Tonsillectomy       .  R hand surgery       .  Bronchoscopy    02/15/2000   .  Right vats.    "   .  Right upper lobe wedge resection.    "   .  Upper gastrointestinal endoscopy    11/25/1999       Esophagitis/ Normal proximal esophagus, stomach and duodenum   .  Colonoscopy    01/17/2009       Dr. Deatra Ina : Internal hemorrhoids/Diverticula, scattered in the ascending colon/  Moderate diverticulosis ascending colon to sigmoid  colon   .  Esophagogastroduodenoscopy     04/23/2003       Dr. Deatra Ina: HIATAL HERNIA   .  Colonoscopy    01/16/2001       Normal   .  Cataract extraction, bilateral       .  Esophagogastroduodenoscopy    03/20/2012       OEU:MPNTIRWE ring-LIKELY CAUSING MILD DYSPHAGIA/SMALL hiatal hernia/Multiple sessile polyps ranging between 3-40m , path benign   .  Cataract extraction       .  Carpal tunnel release           Left wrist   .  Transthoracic echocardiogram    06/24/2009       EF=>55%, mild assymetric LVH; LA mildly dilated; mild mitral annular calcif, borderline MVP, mild-mod MR; mild-mod TR, RV systolic pressure elevated, mild pulm HTN; AV mildly sclerotic; mild pulm valve regurg - ordered r/t bradycardia    .  Endovenous ablation saphenous vein w/ laser    01/2011       Right GSV   .  Cholecystectomy        Family History   Problem  Relation  Age of Onset   .  Diabetes  Mother     .  Heart disease  Mother     .  Stroke  Mother     .  Cancer  Sister         KIDNEY   .  Emphysema  Sister     .  Heart disease  Brother     .  Heart disease  Sister     .  Emphysema  Sister     .  Cancer  Sister         BREAST   .  Heart disease  Brother     .  Colon cancer  Sister     .  Alzheimer's disease  Father     .  Heart disease  Sister     .  Tremor  Sister     .  Tremor  Brother     .  Alcoholism  Child     .  Cancer  Brother     .  Pancreatic cancer  Brother     .  Diabetes  Son  Social History: reports that she has never smoked. She has never used smokeless tobacco. She reports that she does not drink alcohol or use illicit drugs. Allergies:  Allergies   Allergen  Reactions   .  Codeine  Hives   .  Morphine And Related  Itching and Other (See Comments)       Makes pt hallucinate   .  Actonel [Risedronate Sodium]  Other (See Comments)       Abdominal pain   .  Fosamax [Alendronate Sodium]  Other (See Comments)       Abdominal pian   .  Losartan  Other (See Comments)        Hospitalized in 06/2013 with pancreatitis, losartan is associated with increased risk of pancreatitis ,  Hence discontinued   .  Nitrofurantoin  Other (See Comments)       Hospitalized with pancreatitis in 06/2013. Nitrofurantoin implicated as a possible cause    Medications Prior to Admission   Medication  Sig  Dispense  Refill   .  amLODipine (NORVASC) 5 MG tablet  Take 5 mg by mouth daily.         [name]  aspirin EC 81 MG tablet  Take 81 mg by mouth every other day.         .  cholecalciferol (VITAMIN D-400) 400 UNITS TABS tablet  Take 400 Units by mouth daily.         [name]  gabapentin (NEURONTIN) 100 MG capsule  Take 1 capsule (100 mg total) by mouth at bedtime.   30 capsule   5   .  lovastatin (MEVACOR) 20 MG tablet  Take 1 tablet (20 mg total) by mouth at bedtime.   30 tablet   5   .  magnesium gluconate (MAGONATE) 500 MG tablet  Take 500 mg by mouth 2 (two) times daily.         .  metoprolol succinate (TOPROL-XL) 50 MG 24 hr tablet  Take 50 mg by mouth daily. Take with or immediately following a meal.         .  potassium chloride SA (K-DUR,KLOR-CON) 20 MEQ tablet  Take 1 tablet by mouth daily on days when lasix is taken   14 tablet   0   .  potassium chloride SA (K-DUR,KLOR-CON) 20 MEQ tablet  Take 20 mEq by mouth daily.         [name]  topiramate (TOPAMAX) 25 MG tablet  Take 25-50 mg by mouth 2 (two) times daily. Patient takes 1 tablet in the morning and 2 tablets at bedtime.         .  furosemide (LASIX) 20 MG tablet  Take 1 tablet (20 mg total) by mouth daily as needed.   14 tablet   0      Home: Home Living Family/patient expects to be discharged to:: Skilled nursing facility Living Arrangements: Children Available Help at Discharge: Family;Available PRN/intermittently Type of Home: House Home Access: Level entry Home Layout: One level Home Equipment: Walker - 2 wheels;Cane - single point;Bedside commode Additional Comments: pt has a son that lives with her. she states "i am suppose to  use a walker" pt reports when she fell she was not using the walker    Functional History: Prior Function Level of Independence: Independent with assistive device(s) Gait / Transfers Assistance Needed: uses RW Comments: reports getting up from chair was difficult before at home   Functional Status:   Mobility: Bed Mobility Overal bed  mobility: Needs Assistance;+2 for physical assistance;+ 2 for safety/equipment Bed Mobility: Supine to Sit;Sit to Supine Supine to sit: +2 for physical assistance;Max assist Sit to supine: +2 for physical assistance;Max assist General bed mobility comments: pt grimmacing in pain with movement of Lt LE; pt with decreased ability to (A) with bed mobility during this session; very lethargic; eyes open ~50% of time and requried max multimodal cues to keep pt awake at EOB; max (A) to maintain balance heavily leaning to Rt  Transfers Overall transfer level: Needs assistance Equipment used: Rolling walker (2 wheeled) Transfers: Sit to/from Stand Sit to Stand: +2 physical assistance;Max assist General transfer comment: did not assess today due to incr lethargy   ADL: ADL Overall ADL's : Needs assistance/impaired Grooming: Wash/dry face;Moderate assistance;Sitting Grooming Details (indicate cue type and reason): (A) for sitting balance Toilet Transfer: +2 for physical assistance;Maximal assistance;Stand-pivot General ADL Comments: Pt currently requires bed elevated for sit<>stand but able to complete x2. Pt oriented and reports personal errors that resulted in a fall. pt perseverating on the need to return to therapy   Cognition: Cognition Overall Cognitive Status: Impaired/Different from baseline Orientation Level: Oriented X4 Cognition Arousal/Alertness: Lethargic Behavior During Therapy: Flat affect Overall Cognitive Status: Impaired/Different from baseline Area of Impairment: Safety/judgement;Following commands;Awareness;Problem  solving;Attention;Orientation Orientation Level: Disoriented to;Person Current Attention Level: Focused Memory: Decreased short-term memory Following Commands: Follows one step commands inconsistently Safety/Judgement: Decreased awareness of deficits;Decreased awareness of safety Awareness: Intellectual Problem Solving: Slow processing;Difficulty sequencing;Requires verbal cues;Requires tactile cues General Comments: pt referring to PT as "becky", her granddaughter the entire session; pt appeared to be more confused vs earlier therapy session; RN made aware; pt very lethargic    Physical Exam: Blood pressure 123/48, pulse 102, temperature 98.5 F (36.9 C), temperature source Oral, resp. rate 18, height _0  (1.676 m), weight 71.714 kg (158 lb 1.6 oz), SpO2 96.00%. Physical Exam Eyes:  Pupils reactive to light   Neck: Neck supple. No thyromegaly present.   Cardiovascular:  Cardiac rate controlled   Respiratory: Breath sounds normal. No respiratory distress.   GI: Soft. Bowel sounds are normal. She exhibits no distension.  Neurological:  Speech is dysarthric. Left central 7 and tongue deviation. Pleasantly confused but was able to provide her name age. She can provide the hospital subtle cues. Followed simple commands. Limited awareness of her deficits. She does have a right gaze preference. LUE is tr to 2/5 deltoid, bicep, tricep 0/5 Grip. LLE limited by pain. Does 1 KE and ADF.APF. Can differentiate between painful and light touch. Normal strength on the right side Skin:  Her hip incision is dressed appropriately tender , ecchymosis along the left lateral thigh    Results for orders placed during the hospital encounter of 03/20/14 (from the past 48 hour(s))   CBC     Status: Abnormal     Collection Time      03/27/14  6:15 AM       Result  Value  Ref Range     WBC  8.6   4.0 - 10.5 K/uL     RBC  2.90 (*)  3.87 - 5.11 MIL/uL     Hemoglobin  9.4 (*)  12.0 - 15.0 g/dL     HCT  28.2  (*)  36.0 - 46.0 %     MCV  97.2   78.0 - 100.0 fL     MCH  32.4   26.0 - 34.0 pg     MCHC  33.3  30.0 - 36.0 g/dL     RDW  13.4   11.5 - 15.5 %     Platelets  218   150 - 400 K/uL   BASIC METABOLIC PANEL     Status: Abnormal     Collection Time      03/27/14  6:15 AM       Result  Value  Ref Range     Sodium  140   137 - 147 mEq/L     Potassium  3.8   3.7 - 5.3 mEq/L     Chloride  108   96 - 112 mEq/L     CO2  23   19 - 32 mEq/L     Glucose, Bld  119 (*)  70 - 99 mg/dL     BUN  18   6 - 23 mg/dL     Creatinine, Ser  0.46 (*)  0.50 - 1.10 mg/dL     Calcium  7.9 (*)  8.4 - 10.5 mg/dL     GFR calc non Af Amer  >90   >90 mL/min     GFR calc Af Amer  >90   >90 mL/min     Comment:  (NOTE)        The eGFR has been calculated using the CKD EPI equation.        This calculation has not been validated in all clinical situations.        eGFR's persistently <90 mL/min signify possible Chronic Kidney        Disease.     Anion gap  9   5 - 15   CBC     Status: Abnormal     Collection Time      03/28/14  4:30 AM       Result  Value  Ref Range     WBC  7.7   4.0 - 10.5 K/uL     RBC  2.72 (*)  3.87 - 5.11 MIL/uL     Hemoglobin  8.8 (*)  12.0 - 15.0 g/dL     HCT  26.5 (*)  36.0 - 46.0 %     MCV  97.4   78.0 - 100.0 fL     MCH  32.4   26.0 - 34.0 pg     MCHC  33.2   30.0 - 36.0 g/dL     RDW  13.5   11.5 - 15.5 %     Platelets  206   150 - 400 K/uL   FERRITIN     Status: None     Collection Time      03/28/14  4:30 AM       Result  Value  Ref Range     Ferritin  160   10 - 291 ng/mL     Comment:  Performed at [place]BC     Status: Abnormal     Collection Time      03/28/14  4:30 AM       Result  Value  Ref Range     Iron  26 (*)  42 - 135 ug/dL     TIBC  136 (*)  250 - 470 ug/dL     Saturation Ratios  19 (*)  20 - 55 %     UIBC  110 (*)  125 - 400 ug/dL     Comment:  Performed at [facility]  Status: Abnormal      Collection Time      03/28/14  4:30 AM       Result  Value  Ref Range     Sodium  143   137 - 147 mEq/L     Potassium  4.1   3.7 - 5.3 mEq/L     Chloride  109   96 - 112 mEq/L     CO2  23   19 - 32 mEq/L     Glucose, Bld  107 (*)  70 - 99 mg/dL     BUN  20   6 - 23 mg/dL     Creatinine, Ser  0.50   0.50 - 1.10 mg/dL     Calcium  7.8 (*)  8.4 - 10.5 mg/dL     GFR calc non Af Amer  88 (*)  >90 mL/min     GFR calc Af Amer  >90   >90 mL/min     Comment:  (NOTE)        The eGFR has been calculated using the CKD EPI equation.        This calculation has not been validated in all clinical situations.        eGFR's persistently <90 mL/min signify possible Chronic Kidney        Disease.     Anion gap  11   5 - 15    No results found.         Medical Problem List and Plan: 1. Functional deficits secondary to an embolic right parietal basal ganglia infarcts as well as left intertrochanteric hip fracture. Status post ORIF 03/25/2013. Weightbearing as tolerated 2.  DVT Prophylaxis/Anticoagulation: Subcutaneous heparin. Monitor platelet counts any signs of bleeding. Venous Doppler studies negative 3. Pain Management: Oxycodone as needed. Monitor with increased mobility 4. Dysphagia. Dysphagia 1 honey thick liquids. Monitor hydration. Followup speech therapy 5. Neuropsych: This patient is not capable of making decisions on her own behalf. 6. Skin/Wound Care: Routine skin checks 7. Hypertension/atrial fibrillation. Lopressor 25 mg 3 times a day. Cardiac rate control and will 8. Hyperlipidemia. Lipitor 9. GERD. Protonix     Post Admission Physician Evaluation: Functional deficits secondary  to Left HP from right parietal ,basal ganglia infarcts as well as left intertrochanteric hip fracture. Status post ORIF 03/25/2013. Patient is admitted to receive collaborative, interdisciplinary care between the physiatrist, rehab nursing staff, and therapy team. Patient's level of medical complexity  and substantial therapy needs in context of that medical necessity cannot be provided at a lesser intensity of care such as a SNF. Patient has experienced substantial functional loss from his/her baseline which was documented above under the "Functional History" and "Functional Status" headings.  Judging by the patient's diagnosis, physical exam, and functional history, the patient has potential for functional progress which will result in measurable gains while on inpatient rehab.  These gains will be of substantial and practical use upon discharge  in facilitating mobility and self-care at the household level. Physiatrist will provide 24 hour management of medical needs as well as oversight of the therapy plan/treatment and provide guidance as appropriate regarding the interaction of the two. 24 hour rehab nursing will assist with bladder management, bowel management, safety, skin/wound care, disease management, medication administration, pain management and patient education  and help integrate therapy concepts, techniques,education, etc. PT will assess and treat for/with: pre gait, gait training, endurance , safety, equipment, neuromuscular re education.   Goals are: Sup/minA. OT will assess  and treat for/with: ADLs, Cognitive perceptual skills, Neuromuscular re education, safety, endurance, equipment .   Goals are: Min A. Therapy can proceed with showering this patient. SLP will assess and treat for/with:  memory, attention, left neglect, communication .  Goals are:  express basic needs and by of apical information without cueing, . Case Management and Social Worker will assess and treat for psychological issues and discharge planning. Team conference will be held weekly to assess progress toward goals and to determine barriers to discharge. Patient will receive at least 3 hours of therapy per day at least 5 days per week. ELOS: 22-26d        Prognosis:  good and fair   Charlett Blake  M.D. [facility] FAAPM&R (Sports Med, Neuromuscular Med) Diplomate Am Board of Electrodiagnostic Med  03/28/2014

## 2014-03-30 ENCOUNTER — Inpatient Hospital Stay (HOSPITAL_COMMUNITY): Payer: Medicare Other | Admitting: Physical Therapy

## 2014-03-30 ENCOUNTER — Inpatient Hospital Stay (HOSPITAL_COMMUNITY): Payer: Medicare Other | Admitting: Speech Pathology

## 2014-03-30 ENCOUNTER — Inpatient Hospital Stay (HOSPITAL_COMMUNITY): Payer: Medicare Other | Admitting: Occupational Therapy

## 2014-03-30 MED ORDER — ALUM & MAG HYDROXIDE-SIMETH 200-200-20 MG/5ML PO SUSP
15.0000 mL | Freq: Four times a day (QID) | ORAL | Status: DC | PRN
  Administered 2014-03-30 – 2014-04-04 (×4): 30 mL via ORAL
  Filled 2014-03-30 (×4): qty 30

## 2014-03-30 NOTE — Progress Notes (Signed)
Speech Language Pathology Daily Session Note  Patient Details  Name: Erin [name] MRN: [id number] Date of Birth: 06-11-1930  Today's Date: 03/30/2014 SLP Individual Time: 1100-1130 SLP Individual Time Calculation (min): 30 min  Short Term Goals: Week 1: SLP Short Term Goal 1 (Week 1): Pt will utilize recommended swallowing precautions when consuming her currently prescribed diet with min assist  SLP Short Term Goal 2 (Week 1): Pt will improve alertness to sustain attention for 5-7 minutes with min assist.  SLP Short Term Goal 3 (Week 1): Pt will complete oral motor and pharyngeal strengthening exercises with min assist.  SLP Short Term Goal 4 (Week 1): Pt will use external aids to facilitate improved recall of daily information for 75% accuracy with min assist.   Skilled Therapeutic Interventions: Skilled therapeutic intervention focused on dysphagia goals with SLP directly observing patient with spoon-sips of honey thick liquids and assessing her toleration. Patient noted with trace to minimal anterior leakage of liquids from left corner of mouth. Patient sensed this and would periodically wipe her mouth. Patient recalled swallow safety precaution of spoon sips and did not exhibit any overt s/s aspiration during or after PO intake of approiximately 3.5 ounces of honey thickened juice. Patient remained alert and conversed with SLP during session. If she is able to maintain more alertness throughout day, will benefit from reassessment or trials of upgrade solids/liquids. Continue with plan of care.   FIM:  Comprehension Comprehension Mode: Auditory Comprehension: 3-Understands basic 50 - 74% of the time/requires cueing 25 - 50%  of the time Expression Expression Mode: Verbal Expression: 3-Expresses basic 50 - 74% of the time/requires cueing 25 - 50% of the time. Needs to repeat parts of sentences. Social Interaction Social Interaction: 4-Interacts appropriately 75 - 89% of the time - Needs  redirection for appropriate language or to initiate interaction. Problem Solving Problem Solving: 2-Solves basic 25 - 49% of the time - needs direction more than half the time to initiate, plan or complete simple activities Memory Memory: 3-Recognizes or recalls 50 - 74% of the time/requires cueing 25 - 49% of the time FIM - Eating Eating Activity: 1: Helper feeds patient  Pain Pain Assessment Pain Assessment: No/denies pain Pain Score: 0-No pain  Therapy/Group: Individual Therapy  Dannial Monarch 03/30/2014, 3:53 PM  Sonia Baller, MA, CCC-SLP [facility] Speech-Language Pathologist

## 2014-03-30 NOTE — Progress Notes (Signed)
Occupational Therapy Session Note  Patient Details  Name: Erin [name] MRN: [id number] Date of Birth: 10-Jul-1930  Today's Date: 03/30/2014 OT Individual Time: 8250-5397 OT Individual Time Calculation (min): 60 min    Short Term Goals: Week 1:  OT Short Term Goal 1 (Week 1): Patient will complete upper body bathing in supported seated position with mod A OT Short Term Goal 2 (Week 1): Patient will demonstrate improved attention to left as evidenced by dressing upper body with only setup assist OT Short Term Goal 3 (Week 1): Pt will tolerate standing at sink with max assist during assisted lower body dressing for 10 seconds OT Short Term Goal 4 (Week 1): Patient will complete toilet hygiene with mod assist to manage clothing OT Short Term Goal 5 (Week 1): Pt will demo ability to perform SROM at LUE with supervision  Skilled Therapeutic Interventions/Progress Updates:  Pt with no c/o pain but grimaces with movements of R LE. Pt refusing supine to sit during session for dressing and bathing. Pt very lethargic during session requiring therapist to provide constant cues to keep her awake for participation. Pt having difficulty keeping eyes open at all but did report sleeping well last night. No family present during session. Pt requesting water and therapist provided education requarding thickened liquids currently. Pt verbalized understanding and appropriate liquids given to patient. Pt washed faces,chest, abdomen, and peri area with verbal cues for alertness and encouragement. Pt was oriented to self, location, and situation. Pt unable to remember this unfamiliar therapist name. Rolling to the L and R with assist of 2 in order to wash buttocks and change diaper. Pt crying out in pain when rolled. Pt supine in bed awaiting with bed alarm on, call bell within reach, and all needed items available upon exiting the room.   Therapy Documentation Precautions:  Precautions Precautions: Fall Precaution  Comments: subluxation of LT UE noted; per family and patient, muscle wasting at LUE d/t prior injury (fall w/fracture) Restrictions Weight Bearing Restrictions: Yes LLE Weight Bearing: Weight bearing as tolerated Pain: Pain Assessment Pain Assessment: No/denies pain Pain Score: 0-No pain ADL: ADL ADL Comments: see FIM  See FIM for current functional status  Therapy/Group: Individual Therapy  Phineas Semen 03/30/2014, 4:15 PM

## 2014-03-30 NOTE — Progress Notes (Signed)
80 y.o. right-handed female with history of question Parkinson's disease followed by Dr. Jannifer Franklin, frequent falls, atrial fibrillation on no anti-coagulation except aspirin due to frequent falls. Patient lives with family use and occasional cane walker when needed and was still driving. Presented 03/20/2014 after mechanical fall when she lost her balance landing on her left hip without loss of consciousness and was admitted to an [facility]. X-rays and imaging revealed a left intertrochanteric hip fracture. Patient noted to be confused with some left-sided weakness. Cranial CT scan showed right parietal lobe distribution of the right middle cerebral artery consistent with acute to subacute ischemia. CTA head and neck consistent with acute infarct right basal ganglia right parietal white matter. High-grade stenosis versus occlusion of distal right M1 segment. Neurology consulted. Patient did not receive TPA. Echocardiogram with ejection fraction of 60% no wall motion abnormalities. Venous Doppler studies lower extremities negative for DVT.  Subjective/Complaints: Severe dysarthria Why does my groin hurt? Appreciate dietary consult  Review of Systems - cannot obtain secondary to communication deficits from CVA  Objective: Vital Signs: Blood pressure 126/52, pulse 98, temperature 99.3 F (37.4 C), temperature source Oral, resp. rate 18, weight 74.39 kg (164 lb), SpO2 98.00%. No results found. Results for orders placed during the hospital encounter of 03/28/14 (from the past 72 hour(s))  CBC WITH DIFFERENTIAL     Status: Abnormal   Collection Time    03/29/14  5:37 AM      Result Value Ref Range   WBC 8.8  4.0 - 10.5 K/uL   RBC 2.80 (*) 3.87 - 5.11 MIL/uL   Hemoglobin 8.9 (*) 12.0 - 15.0 g/dL   HCT 27.4 (*) 36.0 - 46.0 %   MCV 97.9  78.0 - 100.0 fL   MCH 31.8  26.0 - 34.0 pg   MCHC 32.5  30.0 - 36.0 g/dL   RDW 13.3  11.5 - 15.5 %   Platelets 245  150 - 400 K/uL   Neutrophils Relative % 73  43 - 77 %    Neutro Abs 6.5  1.7 - 7.7 K/uL   Lymphocytes Relative 18  12 - 46 %   Lymphs Abs 1.6  0.7 - 4.0 K/uL   Monocytes Relative 7  3 - 12 %   Monocytes Absolute 0.6  0.1 - 1.0 K/uL   Eosinophils Relative 2  0 - 5 %   Eosinophils Absolute 0.1  0.0 - 0.7 K/uL   Basophils Relative 0  0 - 1 %   Basophils Absolute 0.0  0.0 - 0.1 K/uL  COMPREHENSIVE METABOLIC PANEL     Status: Abnormal   Collection Time    03/29/14  5:37 AM      Result Value Ref Range   Sodium 144  137 - 147 mEq/L   Potassium 4.1  3.7 - 5.3 mEq/L   Chloride 107  96 - 112 mEq/L   CO2 25  19 - 32 mEq/L   Glucose, Bld 109 (*) 70 - 99 mg/dL   BUN 16  6 - 23 mg/dL   Creatinine, Ser 0.43 (*) 0.50 - 1.10 mg/dL   Calcium 7.9 (*) 8.4 - 10.5 mg/dL   Total Protein 4.7 (*) 6.0 - 8.3 g/dL   Albumin 2.0 (*) 3.5 - 5.2 g/dL   AST 22  0 - 37 U/L   ALT 12  0 - 35 U/L   Alkaline Phosphatase 62  39 - 117 U/L   Total Bilirubin 0.5  0.3 - 1.2 mg/dL  GFR calc non Af Amer >90  >90 mL/min   GFR calc Af Amer >90  >90 mL/min   Comment: (NOTE)     The eGFR has been calculated using the CKD EPI equation.     This calculation has not been validated in all clinical situations.     eGFR's persistently <90 mL/min signify possible Chronic Kidney     Disease.   Anion gap 12  5 - 15      Physical Exam  Eyes:  Pupils reactive to light  Neck: Neck supple. No thyromegaly present.  Cardiovascular:  Cardiac rate controlled  Respiratory: Breath sounds normal. No respiratory distress.  GI: Soft. Bowel sounds are normal. She exhibits no distension.  Neurological:  Speech is dysarthric. Left central 7 and tongue deviation. Pleasantly confused but was able to provide her name age. She can provide the hospital subtle cues. Followed simple commands. Limited awareness of her deficits. She does have a right gaze preference. LUE is tr to 2/5 deltoid, bicep, tricep 0/5 Grip. LLE limited by pain. Does 1 KE and ADF.APF. Can differentiate between painful and light  touch. Normal strength on the right side  Skin:  Her hip incision is not tender, small amt ecchymosis lateral thigh   Assessment/Plan: 1. Functional deficits secondary to left hemiparesis from right CVA as well as left intertrochanteric hip fracture status post ORIF 03/25/2014 which require 3+ hours per day of interdisciplinary therapy in a comprehensive inpatient rehab setting. Physiatrist is providing close team supervision and 24 hour management of active medical problems listed below. Physiatrist and rehab team continue to assess barriers to discharge/monitor patient progress toward functional and medical goals. FIM: FIM - Bathing Bathing Steps Patient Completed: Chest;Left Arm;Abdomen;Front perineal area Bathing: 2: Max-Patient completes 3-4 50f10 parts or 25-49%  FIM - Upper Body Dressing/Undressing Upper body dressing/undressing: 0: Wears gown/pajamas-no public clothing FIM - Lower Body Dressing/Undressing Lower body dressing/undressing: 0: Wears gInterior and spatial designer    FIM - TAir cabin crewTransfers: 0-Activity did not occur  FIM - BControl and instrumentation engineerDevices: Sliding board;Arm rests Bed/Chair Transfer: 1: Two helpers  FIM - Locomotion: Wheelchair Locomotion: Wheelchair: 1: Total Assistance/staff pushes wheelchair (Pt<25%) FIM - Locomotion: Ambulation Locomotion: Ambulation: 0: Activity did not occur (Unsafe at this time)  Comprehension Comprehension Mode: Auditory Comprehension: 3-Understands basic 50 - 74% of the time/requires cueing 25 - 50%  of the time  Expression Expression Mode: Verbal Expression: 2-Expresses basic 25 - 49% of the time/requires cueing 50 - 75% of the time. Uses single words/gestures.  Social Interaction Social Interaction: 3-Interacts appropriately 50 - 74% of the time - May be physically or verbally inappropriate.  Problem Solving Problem Solving: 2-Solves basic 25 - 49% of the time - needs  direction more than half the time to initiate, plan or complete simple activities  Memory Memory: 2-Recognizes or recalls 25 - 49% of the time/requires cueing 51 - 75% of the time Medical Problem List and Plan:  1. Functional deficits secondary to an embolic right parietal basal ganglia infarcts as well as left intertrochanteric hip fracture. Status post ORIF 03/25/2014. Weightbearing as tolerated , poor awareness of def 2. DVT Prophylaxis/Anticoagulation: Subcutaneous heparin. Monitor platelet counts any signs of bleeding. Venous Doppler studies negative  3. Pain Management: Oxycodone as needed. Monitor with increased mobility  4. Dysphagia. Dysphagia 1 honey thick liquids. Monitor hydration. Followup speech therapy  5. Neuropsych: This patient is not capable of making decisions on her own behalf.  6. Skin/Wound Care: Routine skin checks  7. Hypertension/atrial fibrillation. Lopressor 25 mg 3 times a day. Cardiac rate control 8. Hyperlipidemia. Lipitor  9. GERD. Protonix    LOS (Days) 2 A FACE TO FACE EVALUATION WAS PERFORMED  KIRSTEINS,ANDREW E 03/30/2014, 11:23 AM

## 2014-03-30 NOTE — Progress Notes (Signed)
Physical Therapy Session Note  Patient Details  Name: Erin [name] MRN: [id number] Date of Birth: 09-Jun-1930  Today's Date: 03/30/2014 PT Individual Time: 1007-1105 and 1540-0867 PT Individual Time Calculation (min): 58 min and 39 min   Short Term Goals: Week 1:  PT Short Term Goal 1 (Week 1): Pt will perform bilat rolling with bed rails and Max A of single therapist. PT Short Term Goal 2 (Week 1): Pt will perform supine<>sit with Max A of single therapist. PT Short Term Goal 3 (Week 1): Pt will transfer from bed<>w/c with Total A of single therapist. PT Short Term Goal 4 (Week 1): Pt will ambulate x10' in controlled environment with +2A. PT Short Term Goal 5 (Week 1): Pt will perform static sitting balance x60 seconds with min A of single therapist.  Skilled Therapeutic Interventions/Progress Updates:   Pt received in bed, asleep.  Pt easily aroused but had increased difficulty staying awake.  Pt oriented to self, place and situation.  Performed AAROM of LUE and LLE for ROM, strengthening and minimize pain once mobility initiated.  Pt did not c/o pain in L hip during mobility but did c/o groin pain and unable to maintain alertness to perform more than 2-3 reps of each exercise.  Also performed active and passive head/neck ROM into L lateral flexion and L rotation.  Pt noted to still be on bed pan.  Performed rolling to L and R with use of rail and max-total A to remove bed pan and don clean brief and Maxi-sky harness.  Pt transferred to tilt in space w/c with Maxi-sky and transported to gym total A in tilt in space.  In gym performed one sit > stand from w/c with +2 A (3 musketeers style) and maintained standing with +2 total A with use of mirror, verbal, visual and tactile cues for upright head and trunk posture and cues for terminal hip extension with therapist providing support to maintain L knee extension; pt able to take small step forwards, back with RLE but unable to advance LLE.  Returned  to w/c and to room to rest in w/c until next therapy session.   PM session: pt just returned to bed with nursing staff.  Family present to visit.  Pt asleep but easily awakened.  Pt continues to c/o L groin pain; RN notified; no pain with palpation but does experience pain with movement of L ankle, knee or hip. Pt participated in bed level LLE strengthening exercises; pt more alert this pm and better able to attend to exercises.  Performed PROM for L ankle DF + 8-10 reps each L heel slides, hip ABD/ADD, glute sets and SAQ.  Also continued neck AROM and PROM for increased L rotation and L lateral flexion to facilitate maintaining her head in midline.  Family educated on remaining on pt's L side so that she is required to rotate her head L in order to talk to them/see them.  Pt performed one rotation to the L with mod A to place pillows to allow pt to remain in semi-sidelying on L.  At end of session pt demonstrating improved ability to maintain head in midline on pillow.     Therapy Documentation Precautions:  Precautions Precautions: Fall Precaution Comments: subluxation of LT UE noted; per family and patient, muscle wasting at LUE d/t prior injury (fall w/fracture) Restrictions Weight Bearing Restrictions: Yes LLE Weight Bearing: Weight bearing as tolerated Vital Signs: Oxygen Therapy SpO2: 95 % O2 Device: None (Room air) Pulse  Oximetry Type: Intermittent Pain: Pain Assessment Pain Assessment: No/denies pain  See FIM for current functional status  Therapy/Group: Individual Therapy  Raylene Everts [employer] 03/30/2014, 12:33 PM

## 2014-03-31 ENCOUNTER — Inpatient Hospital Stay (HOSPITAL_COMMUNITY): Payer: Medicare Other

## 2014-03-31 ENCOUNTER — Inpatient Hospital Stay (HOSPITAL_COMMUNITY): Payer: Medicare Other | Admitting: *Deleted

## 2014-03-31 NOTE — Progress Notes (Signed)
Physical Therapy Session Note  Patient Details  Name: Erin [name] MRN: [id number] Date of Birth: [birth year]  Today's Date: 03/31/2014 PT Individual Time: 1400-1500 PT Individual Time Calculation (min): 60 min   Short Term Goals: Week 1:  PT Short Term Goal 1 (Week 1): Pt will perform bilat rolling with bed rails and Max A of single therapist. PT Short Term Goal 2 (Week 1): Pt will perform supine<>sit with Max A of single therapist. PT Short Term Goal 3 (Week 1): Pt will transfer from bed<>w/c with Total A of single therapist. PT Short Term Goal 4 (Week 1): Pt will ambulate x10' in controlled environment with +2A. PT Short Term Goal 5 (Week 1): Pt will perform static sitting balance x60 seconds with min A of single therapist.  Skilled Therapeutic Interventions/Progress Updates:    Pt received supine in bed asleep, easily aroused and agreeable to participate in therapy. Pt easily aroused initially but had difficulty maintaining attention, with eyes closed ~50% of session. Pt moved supine>sit w/ MaxA for managing BLE and trunk. Pt sat EOB for ~15 minutes w/ assist ranging from CGA to [place]. Pt benefited from max VC's to use RUE to maintain midline. Pt able to detect with ~50% accuracy which way she was leaning or if she was in midline. Pt tended to lose balance posterior while seated, therapist allowed pt to lay down posteriorly several times. While laying back with feet off of bed pt able to pull self back to seated w/ ModA. Pt complained of L groin pain frequently, was unaware of where it was coming from, recalled L hip fracture after mod cueing from therapist. Pt moved back to supine w/ MaxA, +2A to place sling under pt. Transferred pt to TIS w/c w/ [facility] lift. Pt placed in front of mirror in rehab gym to improve midline orientation. With visual cueing, pt able to achieve thoracic and cervical spine in midline orientation. Pt transported to day room via w/c, left in day room for Stroke  Support Group w/ OT and therapy techs present.   Therapy Documentation Precautions:  Precautions Precautions: Fall Precaution Comments: subluxation of LT UE noted; per family and patient, muscle wasting at LUE d/t prior injury (fall w/fracture) Restrictions Weight Bearing Restrictions: Yes LLE Weight Bearing: Weight bearing as tolerated General:   Vital Signs: Therapy Vitals Temp: 99.1 F (37.3 C) Temp src: Oral Pulse Rate: 97 Resp: 17 BP: 133/57 mmHg Patient Position (if appropriate): Sitting Oxygen Therapy SpO2: 95 % Pain:   Mobility:   Locomotion :    Trunk/Postural Assessment :    Balance:   Exercises:   Other Treatments:    See FIM for current functional status  Therapy/Group: Individual Therapy  Rada Hay Rada Hay, PT, DPT 03/31/2014, 4:49 PM

## 2014-03-31 NOTE — Progress Notes (Signed)
80 y.o. right-handed female with history of question Parkinson's disease followed by Dr. Jannifer Franklin, frequent falls, atrial fibrillation on no anti-coagulation except aspirin due to frequent falls. Patient lives with family use and occasional cane walker when needed and was still driving. Presented 03/20/2014 after mechanical fall when she lost her balance landing on her left hip without loss of consciousness and was admitted to an [facility]. X-rays and imaging revealed a left intertrochanteric hip fracture. Patient noted to be confused with some left-sided weakness. Cranial CT scan showed right parietal lobe distribution of the right middle cerebral artery consistent with acute to subacute ischemia. CTA head and neck consistent with acute infarct right basal ganglia right parietal white matter. High-grade stenosis versus occlusion of distal right M1 segment. Neurology consulted. Patient did not receive TPA. Echocardiogram with ejection fraction of 60% no wall motion abnormalities. Venous Doppler studies lower extremities negative for DVT.  Subjective/Complaints: Severe dysarthria Pt aware that she had hip fx No other pain c/os  Review of Systems - cannot obtain secondary to communication deficits from CVA  Objective: Vital Signs: Blood pressure 130/53, pulse 94, temperature 97.8 F (36.6 C), temperature source Axillary, resp. rate 18, weight 74.39 kg (164 lb), SpO2 94.00%. No results found. Results for orders placed during the hospital encounter of 03/28/14 (from the past 72 hour(s))  CBC WITH DIFFERENTIAL     Status: Abnormal   Collection Time    03/29/14  5:37 AM      Result Value Ref Range   WBC 8.8  4.0 - 10.5 K/uL   RBC 2.80 (*) 3.87 - 5.11 MIL/uL   Hemoglobin 8.9 (*) 12.0 - 15.0 g/dL   HCT 27.4 (*) 36.0 - 46.0 %   MCV 97.9  78.0 - 100.0 fL   MCH 31.8  26.0 - 34.0 pg   MCHC 32.5  30.0 - 36.0 g/dL   RDW 13.3  11.5 - 15.5 %   Platelets 245  150 - 400 K/uL   Neutrophils Relative % 73  43 - 77 %    Neutro Abs 6.5  1.7 - 7.7 K/uL   Lymphocytes Relative 18  12 - 46 %   Lymphs Abs 1.6  0.7 - 4.0 K/uL   Monocytes Relative 7  3 - 12 %   Monocytes Absolute 0.6  0.1 - 1.0 K/uL   Eosinophils Relative 2  0 - 5 %   Eosinophils Absolute 0.1  0.0 - 0.7 K/uL   Basophils Relative 0  0 - 1 %   Basophils Absolute 0.0  0.0 - 0.1 K/uL  COMPREHENSIVE METABOLIC PANEL     Status: Abnormal   Collection Time    03/29/14  5:37 AM      Result Value Ref Range   Sodium 144  137 - 147 mEq/L   Potassium 4.1  3.7 - 5.3 mEq/L   Chloride 107  96 - 112 mEq/L   CO2 25  19 - 32 mEq/L   Glucose, Bld 109 (*) 70 - 99 mg/dL   BUN 16  6 - 23 mg/dL   Creatinine, Ser 0.43 (*) 0.50 - 1.10 mg/dL   Calcium 7.9 (*) 8.4 - 10.5 mg/dL   Total Protein 4.7 (*) 6.0 - 8.3 g/dL   Albumin 2.0 (*) 3.5 - 5.2 g/dL   AST 22  0 - 37 U/L   ALT 12  0 - 35 U/L   Alkaline Phosphatase 62  39 - 117 U/L   Total Bilirubin 0.5  0.3 - 1.2  mg/dL   GFR calc non Af Amer >90  >90 mL/min   GFR calc Af Amer >90  >90 mL/min   Comment: (NOTE)     The eGFR has been calculated using the CKD EPI equation.     This calculation has not been validated in all clinical situations.     eGFR's persistently <90 mL/min signify possible Chronic Kidney     Disease.   Anion gap 12  5 - 15      Physical Exam  Eyes:  Pupils reactive to light  Neck: Neck supple. No thyromegaly present.  Cardiovascular:  Cardiac rate controlled  Respiratory: Breath sounds normal. No respiratory distress.  GI: Soft. Bowel sounds are normal. She exhibits no distension.  Neurological:  Speech is dysarthric. Left central 7 and tongue deviation. Pleasantly confused but was able to provide her name age. She can provide the hospital subtle cues. Followed simple commands. Limited awareness of her deficits. She does have a right gaze preference. LUE is tr to 2/5 deltoid, bicep, tricep 0/5 Grip. LLE limited by pain. Does 1 KE and ADF.APF. Can differentiate between painful and light  touch. Normal strength on the right side  Skin:  Her hip incision is not tender, small amt ecchymosis lateral thigh   Assessment/Plan: 1. Functional deficits secondary to left hemiparesis from right CVA as well as left intertrochanteric hip fracture status post ORIF 03/25/2014 which require 3+ hours per day of interdisciplinary therapy in a comprehensive inpatient rehab setting. Physiatrist is providing close team supervision and 24 hour management of active medical problems listed below. Physiatrist and rehab team continue to assess barriers to discharge/monitor patient progress toward functional and medical goals. FIM: FIM - Bathing Bathing Steps Patient Completed: Chest;Left Arm;Front perineal area Bathing: 2: Max-Patient completes 3-4 63f10 parts or 25-49%  FIM - Upper Body Dressing/Undressing Upper body dressing/undressing: 0: Wears gown/pajamas-no public clothing FIM - Lower Body Dressing/Undressing Lower body dressing/undressing: 0: Wears gown/pajamas-no public clothing  FIM - Toileting Toileting: 0: Activity did not occur  FIM - TAir cabin crewTransfers: 0-Activity did not occur  FIM - BControl and instrumentation engineerDevices: Sliding board;Arm rests Bed/Chair Transfer: 1: Mechanical lift  FIM - Locomotion: Wheelchair Locomotion: Wheelchair: 1: Total Assistance/staff pushes wheelchair (Pt<25%) FIM - Locomotion: Ambulation Locomotion: Ambulation: 0: Activity did not occur  Comprehension Comprehension Mode: Auditory Comprehension: 3-Understands basic 50 - 74% of the time/requires cueing 25 - 50%  of the time  Expression Expression Mode: Verbal Expression: 3-Expresses basic 50 - 74% of the time/requires cueing 25 - 50% of the time. Needs to repeat parts of sentences.  Social Interaction Social Interaction: 4-Interacts appropriately 75 - 89% of the time - Needs redirection for appropriate language or to initiate interaction.  Problem  Solving Problem Solving: 2-Solves basic 25 - 49% of the time - needs direction more than half the time to initiate, plan or complete simple activities  Memory Memory: 3-Recognizes or recalls 50 - 74% of the time/requires cueing 25 - 49% of the time Medical Problem List and Plan:  1. Functional deficits secondary to an embolic right parietal basal ganglia infarcts as well as left intertrochanteric hip fracture. Status post ORIF 03/25/2014. Weightbearing as tolerated , poor awareness of def 2. DVT Prophylaxis/Anticoagulation: Subcutaneous heparin. Monitor platelet counts any signs of bleeding. Venous Doppler studies negative  3. Pain Management: Oxycodone as needed. Monitor with increased mobility  4. Dysphagia. Dysphagia 1 honey thick liquids. Monitor hydration. Followup speech therapy  5. Neuropsych:  This patient is not capable of making decisions on her own behalf.  6. Skin/Wound Care: Routine skin checks  7. Hypertension/atrial fibrillation. Lopressor 25 mg 3 times a day. Cardiac rate control 8. Hyperlipidemia. Lipitor  9. GERD. Protonix    LOS (Days) 3 A FACE TO FACE EVALUATION WAS PERFORMED  Reni Hausner E 03/31/2014, 10:51 AM

## 2014-04-01 ENCOUNTER — Inpatient Hospital Stay (HOSPITAL_COMMUNITY): Payer: Medicare Other | Admitting: Rehabilitation

## 2014-04-01 ENCOUNTER — Inpatient Hospital Stay (HOSPITAL_COMMUNITY): Payer: Medicare Other | Admitting: Speech Pathology

## 2014-04-01 ENCOUNTER — Encounter (HOSPITAL_COMMUNITY): Payer: Self-pay | Admitting: Orthopaedic Surgery

## 2014-04-01 ENCOUNTER — Inpatient Hospital Stay (HOSPITAL_COMMUNITY): Payer: Medicare Other | Admitting: Occupational Therapy

## 2014-04-01 NOTE — IPOC Note (Signed)
Overall Plan of Care [facility]) Patient Details Name: CORLEY KOHLS MRN: [id number] DOB: [birth year]  Admitting Diagnosis: r parietal bg infarct l it hip fx  Hospital Problems: Active Problems:   CVA (cerebral infarction)     Functional Problem List: Nursing Behavior;Bladder;Edema;Endurance;Medication Management;Motor;Nutrition;Pain;Perception;Safety;Skin Integrity  PT Balance;Endurance;Motor;Pain;Perception;Safety;Sensory  OT Balance;Safety;Endurance;Pain;Motor  SLP Cognition;Nutrition;Safety  TR         Basic ADL's: OT Eating;Grooming;Bathing;Dressing;Toileting     Advanced  ADL's: OT       Transfers: PT Bed Mobility;Bed to Chair;Car;Furniture  OT Toilet;Tub/Shower     Locomotion: PT Ambulation;Wheelchair Mobility     Additional Impairments: OT Fuctional Use of Upper Extremity (L-UE)  SLP Swallowing;Social Cognition   Attention;Problem Solving;Memory;Awareness  TR      Anticipated Outcomes Item Anticipated Outcome  Self Feeding Supervision  Swallowing  supervision    Basic self-care  Min Assist  Toileting  Min Assist   Bathroom Transfers Min Assist  Bowel/Bladder  To be continent of bowel and bladder with mini asset.  Transfers  Supervision-Min A  Locomotion  Min-Mod A  Communication     Cognition  supervision   Pain  Pain level 3 or less on a scale of 0-10  Safety/Judgment  Safety/judgement with minimal assistance   Therapy Plan: PT Intensity: Minimum of 1-2 x/day ,45 to 90 minutes PT Frequency: 5 out of 7 days PT Duration Estimated Length of Stay: 21-28 days OT Intensity: Minimum of 1-2 x/day, 45 to 90 minutes OT Frequency: 5 out of 7 days OT Duration/Estimated Length of Stay: 3-4 weeks SLP Intensity: Minumum of 1-2 x/day, 30 to 90 minutes SLP Frequency: 5 out of 7 days SLP Duration/Estimated Length of Stay: 14-21 days        Team Interventions: Nursing Interventions    PT interventions Ambulation/gait training;Balance/vestibular  training;Cognitive remediation/compensation;Discharge planning;DME/adaptive equipment instruction;Disease management/prevention;Functional mobility training;Neuromuscular re-education;Splinting/orthotics;UE/LE Coordination activities;UE/LE Strength taining/ROM;Therapeutic Exercise;Patient/family education;Therapeutic Activities;Wheelchair propulsion/positioning;Visual/perceptual remediation/compensation;Pain management  OT Interventions UE/LE Coordination activities;Therapeutic Exercise;UE/LE Strength taining/ROM;Neuromuscular re-education;DME/adaptive equipment instruction;Discharge planning;Balance/vestibular training;Pain management;Self Care/advanced ADL retraining;Therapeutic Activities;Patient/family education;Functional mobility training  SLP Interventions Oral motor exercises;Patient/family education;Functional tasks;Cueing hierarchy;Dysphagia/aspiration precaution training;Cognitive remediation/compensation;Internal/external aids;Speech/Language facilitation  TR Interventions    SW/CM Interventions Discharge Planning;Psychosocial Support;Patient/Family Education    Team Discharge Planning: Destination: PT-Home (Home vs. SNF pending pt progress) ,OT- Home , SLP- (TBD) Projected Follow-up: PT-Home health PT;Skilled nursing facility;24 hour supervision/assistance, OT-  Home health OT, SLP-24 hour supervision/assistance;Home Health SLP;Skilled Nursing facility Projected Equipment Needs: PT-To be determined, OT- To be determined, SLP-None recommended by SLP Equipment Details: PT-Pt owns personal cane and walker, OT-  Patient/family involved in discharge planning: PT- Patient;Family member/caregiver,  OT-Patient;Family member/caregiver, SLP-Patient unable/family or caregive not available  MD ELOS: 18-22 d Medical Rehab Prognosis:  Good Assessment: 80 y.o. right-handed female with history of question Parkinson's disease followed by Dr. Jannifer Franklin, frequent falls, atrial fibrillation on no  anti-coagulation except aspirin due to frequent falls. Patient lives with family use and occasional cane walker when needed and was still driving. Presented 03/20/2014 after mechanical fall when she lost her balance landing on her left hip without loss of consciousness and was admitted to an [facility]. X-rays and imaging revealed a left intertrochanteric hip fracture. Patient noted to be confused with some left-sided weakness. Cranial CT scan showed right parietal lobe distribution of the right middle cerebral artery consistent with acute to subacute ischemia. CTA head and neck consistent with acute infarct right basal ganglia right parietal white matter. High-grade stenosis versus occlusion of distal right M1 segment.  Now requiring 24/7 Rehab RN,MD, as well as CIR level PT, OT and SLP.  Treatment team will focus on ADLs and mobility with goals set at [facility]   See Team Conference Notes for weekly updates to the plan of care

## 2014-04-01 NOTE — Progress Notes (Signed)
Speech Language Pathology Daily Session Note  Patient Details  Name: Erin [name] MRN: [id number] Date of Birth: [birth year]  Today's Date: 04/01/2014 SLP Individual Time: 8786-7672 SLP Individual Time Calculation (min): 60 min  Short Term Goals: Week 1: SLP Short Term Goal 1 (Week 1): Pt will utilize recommended swallowing precautions when consuming her currently prescribed diet with min assist  SLP Short Term Goal 2 (Week 1): Pt will improve alertness to sustain attention for 5-7 minutes with min assist.  SLP Short Term Goal 3 (Week 1): Pt will complete oral motor and pharyngeal strengthening exercises with min assist.  SLP Short Term Goal 4 (Week 1): Pt will use external aids to facilitate improved recall of daily information for 75% accuracy with min assist.   Skilled Therapeutic Interventions: Skilled treatment session focused on addressing diagnostic treatment of reading, writing and money management.  Patient required Max assist to utilize working memory and self-monitoring strategies during money management task.  Patient also required Mod assist to working memory strategies for paragraph level reading comprehension and writing was characterized by poor legibility but patient reported it to be her baseline.  Patient required Ma cues throughout to maintain upright head posture and attend to the left of midline.     FIM:  Comprehension Comprehension Mode: Auditory Comprehension: 3-Understands basic 50 - 74% of the time/requires cueing 25 - 50%  of the time Expression Expression Mode: Verbal Expression: 3-Expresses basic 50 - 74% of the time/requires cueing 25 - 50% of the time. Needs to repeat parts of sentences. Social Interaction Social Interaction: 4-Interacts appropriately 75 - 89% of the time - Needs redirection for appropriate language or to initiate interaction. Problem Solving Problem Solving: 2-Solves basic 25 - 49% of the time - needs direction more than half the time to  initiate, plan or complete simple activities Memory Memory: 3-Recognizes or recalls 50 - 74% of the time/requires cueing 25 - 49% of the time  Pain Pain Assessment Pain Assessment: No/denies pain Pain Score: 4   Therapy/Group: Individual Therapy  Carmelia Roller., CCC-SLP [phone]  [place] 04/01/2014, 12:42 PM

## 2014-04-01 NOTE — Progress Notes (Signed)
80 y.o. right-handed female with history of question Parkinson's disease followed by Dr. Jannifer Franklin, frequent falls, atrial fibrillation on no anti-coagulation except aspirin due to frequent falls. Patient lives with family use and occasional cane walker when needed and was still driving. Presented 03/20/2014 after mechanical fall when she lost her balance landing on her left hip without loss of consciousness and was admitted to an [facility]. X-rays and imaging revealed a left intertrochanteric hip fracture. Patient noted to be confused with some left-sided weakness. Cranial CT scan showed right parietal lobe distribution of the right middle cerebral artery consistent with acute to subacute ischemia. CTA head and neck consistent with acute infarct right basal ganglia right parietal white matter. High-grade stenosis versus occlusion of distal right M1 segment. Neurology consulted. Patient did not receive TPA. Echocardiogram with ejection fraction of 60% no wall motion abnormalities. Venous Doppler studies lower extremities negative for DVT.  Subjective/Complaints: Left groin pain, pt aware of fx and CVA but says, " I want to go Home"  Review of Systems - cannot obtain secondary to communication deficits from CVA  Objective: Vital Signs: Blood pressure 156/80, pulse 83, temperature 98.4 F (36.9 C), temperature source Oral, resp. rate 18, weight 74.39 kg (164 lb), SpO2 94.00%. No results found. No results found for this or any previous visit (from the past 72 hour(s)).    Physical Exam  Eyes:  Pupils reactive to light  Neck: Neck supple. No thyromegaly present.  Cardiovascular:  Cardiac rate controlled  Respiratory: Breath sounds normal. No respiratory distress.  GI: Soft. Bowel sounds are normal. She exhibits no distension.  Neurological:  Speech is dysarthric. Left central 7 and tongue deviation. Pleasantly confused but was able to provide her name age. She can provide the hospital subtle cues.  Followed simple commands. Limited awareness of her deficits. She does have a right gaze preference. LUE is tr to 2/5 deltoid, bicep, tricep 0/5 Grip. LLE limited by pain. Does 1 KE and ADF.APF. Can differentiate between painful and light touch. Normal strength on the right side  Skin:  Her hip incision is not tender, small amt ecchymosis lateral thigh, no drainage under foam dressing   Assessment/Plan: 1. Functional deficits secondary to left hemiparesis from right CVA as well as left intertrochanteric hip fracture status post ORIF 03/25/2014 which require 3+ hours per day of interdisciplinary therapy in a comprehensive inpatient rehab setting. Physiatrist is providing close team supervision and 24 hour management of active medical problems listed below. Physiatrist and rehab team continue to assess barriers to discharge/monitor patient progress toward functional and medical goals. FIM: FIM - Bathing Bathing Steps Patient Completed: Chest;Left Arm;Front perineal area Bathing: 2: Max-Patient completes 3-4 38f 10 parts or 25-49%  FIM - Upper Body Dressing/Undressing Upper body dressing/undressing: 0: Wears gown/pajamas-no public clothing FIM - Lower Body Dressing/Undressing Lower body dressing/undressing: 0: Wears gown/pajamas-no public clothing  FIM - Toileting Toileting: 0: Activity did not occur  FIM - Air cabin crew Transfers: 0-Activity did not occur  FIM - Control and instrumentation engineer Devices: Sliding board;Arm rests Bed/Chair Transfer: 2: Supine > Sit: Max A (lifting assist/Pt. 25-49%);1: Mechanical lift;2: Sit > Supine: Max A (lifting assist/Pt. 25-49%)  FIM - Locomotion: Wheelchair Locomotion: Wheelchair: 1: Total Assistance/staff pushes wheelchair (Pt<25%) FIM - Locomotion: Ambulation Locomotion: Ambulation: 0: Activity did not occur  Comprehension Comprehension Mode: Auditory Comprehension: 3-Understands basic 50 - 74% of the time/requires  cueing 25 - 50%  of the time  Expression Expression Mode: Verbal Expression: 3-Expresses basic  50 - 74% of the time/requires cueing 25 - 50% of the time. Needs to repeat parts of sentences.  Social Interaction Social Interaction: 4-Interacts appropriately 75 - 89% of the time - Needs redirection for appropriate language or to initiate interaction.  Problem Solving Problem Solving: 2-Solves basic 25 - 49% of the time - needs direction more than half the time to initiate, plan or complete simple activities  Memory Memory: 3-Recognizes or recalls 50 - 74% of the time/requires cueing 25 - 49% of the time Medical Problem List and Plan:  1. Functional deficits secondary to an embolic right parietal basal ganglia infarcts as well as left intertrochanteric hip fracture. Status post ORIF 03/25/2014. Weightbearing as tolerated , poor awareness of def 2. DVT Prophylaxis/Anticoagulation: Subcutaneous heparin. Monitor platelet counts any signs of bleeding. Venous Doppler studies negative  3. Pain Management: Oxycodone as needed. Monitor with increased mobility  4. Dysphagia. Dysphagia 1 honey thick liquids. Monitor hydration. Followup speech therapy  5. Neuropsych: This patient is not capable of making decisions on her own behalf.  6. Skin/Wound Care: Routine skin checks  7. Hypertension/atrial fibrillation. Lopressor 25 mg 3 times a day. Cardiac rate control 8. Hyperlipidemia. Lipitor  9. GERD. Protonix    LOS (Days) 4 A FACE TO FACE EVALUATION WAS PERFORMED  Nancylee Gaines E 04/01/2014, 8:30 AM

## 2014-04-01 NOTE — Progress Notes (Signed)
Chart and note reviewed. Agree with note.  Kimberly Harris, RD, LDN, CNSC Pager [phone] After Hours Pager [phone]   

## 2014-04-01 NOTE — Progress Notes (Signed)
Physical Therapy Session Note  Patient Details  Name: Erin [name] MRN: [id number] Date of Birth: [birth year]  Today's Date: 04/01/2014 PT Individual Time: 1300-1345 and 1500-1530 PT Individual Time Calculation (min): 45 min and 30 mins  Short Term Goals: Week 1:  PT Short Term Goal 1 (Week 1): Pt will perform bilat rolling with bed rails and Max A of single therapist. PT Short Term Goal 2 (Week 1): Pt will perform supine<>sit with Max A of single therapist. PT Short Term Goal 3 (Week 1): Pt will transfer from bed<>w/c with Total A of single therapist. PT Short Term Goal 4 (Week 1): Pt will ambulate x10' in controlled environment with +2A. PT Short Term Goal 5 (Week 1): Pt will perform static sitting balance x60 seconds with min A of single therapist.  Skilled Therapeutic Interventions/Progress Updates:   First PM session.  Pt received sitting in w/c in room, several family members present.  One family member asking if he could bring her Nexium.  Advised him not to unless he speaks with RN and MD to get that medication cleared with them.  Assisted pt down to therapy gym and transferred w/c<> mat and then w/c>bed with slideboard at total A level (+2 for safety and to ensure placement of SB and that w/c didn't move).  Provided mod cues for hand placement, foot placement and assist for forward weight shift to assist with buttock clearance to prevent shearing across slideboard.  Pt did better the more we did the transfer.  While sitting at EOB, worked on static and dynamic sitting balance while maintaining upright posture.  Pt able to achieve midline with cuing and was able to correct posture with min cues, but unable to hold.  Provided assist posteriorly to facilitate as much thoracic extension as she could tolerate.  Performed reaching task to scan L environment and upwards to facilitate upright head posture.  Progressed to performing two sit<>stands with +2 assist via "three muskateer style" with pt  able to correct upright posture, but unable to maintain with low back pain and L groin pain.  Pt also tolerated taking 3 steps forwards and backwards in same manner.  Again, pt with increased pain in L groin and unable to continue.  Pt assisted back into sitting and back to w/c then back to bed as stated above.  Left in bed with all needs in reach and bed alarm set.   Second PM session:  Pt received lying in bed, agreeable to therapy.  Assisted pt to EOB with HOB flat and without rails at total A level.  Requires total A to roll R, but she was able to initiate moving RLE out of bed once in SL.  While at [facility] [address], worked on increasing forward weight shift with having pt reach for magic cup to self feed.  Pt requires CGA varying to mod A during task.  Pt then transferred to w/c as stated in previous session.  Assisted to hallway outside of gym to have pt work on w/c mobility with RLE.  She was not able to motor plan task, despite [facility] and demonstration cues.  Ended with seated LAQ's x 10 reps each LE.  Pt left in w/c tilted back with all needs in reach and quick release belt donned.   Therapy Documentation Precautions:  Precautions Precautions: Fall Precaution Comments: subluxation of LT UE noted; per family and patient, muscle wasting at LUE d/t prior injury (fall w/fracture) Restrictions Weight Bearing Restrictions: Yes LLE Weight Bearing: Weight  bearing as tolerated   Vital Signs: Therapy Vitals Temp: 98.9 F (37.2 C) Temp src: Oral Pulse Rate: 85 Resp: 18 BP: 131/74 mmHg Patient Position (if appropriate): Lying Oxygen Therapy SpO2: 96 % O2 Device: None (Room air) Pain: Pain Assessment Pain Assessment: 0-10 Pain Score: Asleep Mobility:   Locomotion : Ambulation Ambulation/Gait Assistance: 1: +2 Total assist Wheelchair Mobility Distance: 20   See FIM for current functional status  Therapy/Group: Individual Therapy  Denice Bors 04/01/2014, 6:49 PM

## 2014-04-01 NOTE — Progress Notes (Signed)
Occupational Therapy Session Note  Patient Details  Name: Erin [name] MRN: [id number] Date of Birth: 01-Apr-1930  Today's Date: 04/01/2014 OT Individual Time: 3845-3646 OT Individual Time Calculation (min): 45 min   Short Term Goals: Week 1:  OT Short Term Goal 1 (Week 1): Patient will complete upper body bathing in supported seated position with mod A OT Short Term Goal 2 (Week 1): Patient will demonstrate improved attention to left as evidenced by dressing upper body with only setup assist OT Short Term Goal 3 (Week 1): Pt will tolerate standing at sink with max assist during assisted lower body dressing for 10 seconds OT Short Term Goal 4 (Week 1): Patient will complete toilet hygiene with mod assist to manage clothing OT Short Term Goal 5 (Week 1): Pt will demo ability to perform SROM at LUE with supervision  Skilled Therapeutic Interventions/Progress Updates:  Patient with 8/10 complaints of pain in left hip, RN aware Patient received seated in tilt-in-space w/c. Therapist assisted patient > sink side for focus on ADL retraining of bathing & dressing tasks in sit<>stand position prn. Patient reluctant to don clothes, but with mod encouragement was willing. Focused skilled intervention on functional use of LUE, sit<>stands, posture control in sitting & standing, UB/LB bathing & dressing, grooming tasks of washing face & combing hair, and overall activity tolerance/endurance. At end of session, positioned patient in w/c with pillows and quick release belt for safety; all needs left within reach (soft call bell). Patient with complaints of calling for assistance and no one coming, encouraged patient to call nurses station prn for assistance.   Precautions:  Precautions Precautions: Fall Precaution Comments: subluxation of LT UE noted; per family and patient, muscle wasting at LUE d/t prior injury (fall w/fracture) Restrictions Weight Bearing Restrictions: Yes LLE Weight Bearing: Weight  bearing as tolerated  See FIM for current functional status  Therapy/Group: Individual Therapy  Moira Umholtz 04/01/2014, 11:28 AM

## 2014-04-02 ENCOUNTER — Inpatient Hospital Stay (HOSPITAL_COMMUNITY): Payer: Medicare Other | Admitting: Rehabilitation

## 2014-04-02 ENCOUNTER — Encounter (HOSPITAL_COMMUNITY): Payer: Medicare Other | Admitting: Occupational Therapy

## 2014-04-02 ENCOUNTER — Inpatient Hospital Stay (HOSPITAL_COMMUNITY): Payer: Medicare Other | Admitting: Speech Pathology

## 2014-04-02 NOTE — Progress Notes (Signed)
80 y.o. right-handed female with history of question Parkinson's disease followed by Dr. Jannifer Franklin, frequent falls, atrial fibrillation on no anti-coagulation except aspirin due to frequent falls. Patient lives with family use and occasional cane walker when needed and was still driving. Presented 03/20/2014 after mechanical fall when she lost her balance landing on her left hip without loss of consciousness and was admitted to an [facility]. X-rays and imaging revealed a left intertrochanteric hip fracture. Patient noted to be confused with some left-sided weakness. Cranial CT scan showed right parietal lobe distribution of the right middle cerebral artery consistent with acute to subacute ischemia. CTA head and neck consistent with acute infarct right basal ganglia right parietal white matter. High-grade stenosis versus occlusion of distal right M1 segment. Neurology consulted. Patient did not receive TPA. Echocardiogram with ejection fraction of 60% no wall motion abnormalities. Venous Doppler studies lower extremities negative for DVT.  Subjective/Complaints: Pt without new issues, no groin pain  Review of Systems - cannot obtain secondary to communication deficits from CVA  Objective: Vital Signs: Blood pressure 139/67, pulse 91, temperature 98.6 F (37 C), temperature source Oral, resp. rate 18, weight 74.39 kg (164 lb), SpO2 97.00%. No results found. No results found for this or any previous visit (from the past 72 hour(s)).    Physical Exam  Eyes:  Pupils reactive to light  Neck: Neck supple. No thyromegaly present.  Cardiovascular:  Cardiac rate controlled  Respiratory: Breath sounds normal. No respiratory distress.  GI: Soft. Bowel sounds are normal. She exhibits no distension.  Neurological:  Speech is dysarthric. Left central 7 and tongue deviation. Pleasantly confused but was able to provide her name age. She can provide the hospital subtle cues. Followed simple commands. Limited  awareness of her deficits. She does have a right gaze preference. LUE is tr to 2/5 deltoid, bicep, tricep 0/5 Grip. LLE limited by pain. 1+ HF, 1 KE and ADF.APF. Can differentiate between painful and light touch. Normal strength on the right side  Skin:  Her hip incision is not tender, small amt ecchymosis lateral thigh, no drainage under foam dressing   Assessment/Plan: 1. Functional deficits secondary to left hemiparesis from right CVA as well as left intertrochanteric hip fracture status post ORIF 03/25/2014 which require 3+ hours per day of interdisciplinary therapy in a comprehensive inpatient rehab setting. Physiatrist is providing close team supervision and 24 hour management of active medical problems listed below. Physiatrist and rehab team continue to assess barriers to discharge/monitor patient progress toward functional and medical goals. FIM: FIM - Bathing Bathing Steps Patient Completed: Chest;Left Arm Bathing: 1: Total-Patient completes 0-2 of 10 parts or less than 25%  FIM - Upper Body Dressing/Undressing Upper body dressing/undressing: 1: Total-Patient completed less than 25% of tasks (using button up shirt) FIM - Lower Body Dressing/Undressing Lower body dressing/undressing: 1: Two helpers (sit<>stand position)  FIM - Toileting Toileting: 0: Activity did not occur  FIM - Air cabin crew Transfers: 0-Activity did not occur  FIM - Control and instrumentation engineer Devices: Sliding board;Arm rests Bed/Chair Transfer: 1: Sit > Supine: Total A (helper does all/Pt. < 25%);1: Supine > Sit: Total A (helper does all/Pt. < 25%);1: Two helpers (two helpers for [employer] and w/c stability)  FIM - Locomotion: Wheelchair Distance: 20 Locomotion: Wheelchair: 1: Total Assistance/staff pushes wheelchair (Pt<25%) FIM - Locomotion: Ambulation Locomotion: Ambulation Assistive Devices: Other (comment) (three muskateer style) Ambulation/Gait Assistance: 1: +2 Total  assist Locomotion: Ambulation: 1: Two helpers  Comprehension Comprehension Mode: Auditory Comprehension:  3-Understands basic 50 - 74% of the time/requires cueing 25 - 50%  of the time  Expression Expression Mode: Verbal Expression: 3-Expresses basic 50 - 74% of the time/requires cueing 25 - 50% of the time. Needs to repeat parts of sentences.  Social Interaction Social Interaction: 4-Interacts appropriately 75 - 89% of the time - Needs redirection for appropriate language or to initiate interaction.  Problem Solving Problem Solving: 2-Solves basic 25 - 49% of the time - needs direction more than half the time to initiate, plan or complete simple activities  Memory Memory: 3-Recognizes or recalls 50 - 74% of the time/requires cueing 25 - 49% of the time Medical Problem List and Plan:  1. Functional deficits secondary to an embolic right parietal basal ganglia infarcts as well as left intertrochanteric hip fracture. Status post ORIF 03/25/2014. Weightbearing as tolerated , poor awareness of def 2. DVT Prophylaxis/Anticoagulation: Subcutaneous heparin. Monitor platelet counts any signs of bleeding. Venous Doppler studies negative  3. Pain Management: Oxycodone as needed. Monitor with increased mobility  4. Dysphagia. Dysphagia 1 honey thick liquids. Monitor hydration. Followup speech therapy  5. Neuropsych: This patient is not capable of making decisions on her own behalf.  6. Skin/Wound Care: Routine skin checks  7. Hypertension/atrial fibrillation. Lopressor 25 mg 3 times a day. Cardiac rate control 8. Hyperlipidemia. Lipitor  9. GERD. Protonix    LOS (Days) 5 A FACE TO FACE EVALUATION WAS PERFORMED  KIRSTEINS,ANDREW E 04/02/2014, 8:19 AM

## 2014-04-02 NOTE — Progress Notes (Signed)
Occupational Therapy Session Note  Patient Details  Name: Erin [name] MRN: [id number] Date of Birth: 1930/05/29  Today's Date: 04/02/2014 OT Individual Time: 1100-1200 OT Individual Time Calculation (min): 60 min    Short Term Goals: Week 1:  OT Short Term Goal 1 (Week 1): Patient will complete upper body bathing in supported seated position with mod A OT Short Term Goal 2 (Week 1): Patient will demonstrate improved attention to left as evidenced by dressing upper body with only setup assist OT Short Term Goal 3 (Week 1): Pt will tolerate standing at sink with max assist during assisted lower body dressing for 10 seconds OT Short Term Goal 4 (Week 1): Patient will complete toilet hygiene with mod assist to manage clothing OT Short Term Goal 5 (Week 1): Pt will demo ability to perform SROM at LUE with supervision  Skilled Therapeutic Interventions/Progress Updates:  Pt seated in w/c and awake when arriving. Pt reported 8/10 pain in R hip and RN notified (RN administering medication when leaving). Pt recognized therapist and reported what she had done in therapy the previous day. Pt reported that she needed to have a BM. Pt performed squat pivot toilet t/f w/ max A x2 . Pt had BM and t/f to w/c w/ squat pivot t/f w/ max A x2. Pt doffed gown and performed UB bathing and dressing w/ R hand w/ mod A and v/c's for attending to L UE. Pt performed 2 sit to stand t/f for bathing and dressing LB w/ max A x2. Pt maintained standing balance during bathing and dressing for 3 min w/ mod A x 1. Therapist applied ted hose and shoes. Pt brushed teeth using suctioning toothbrush w/ set-up A. Pt seated in w/c w/ LUE positioned on pillow and needs in reach when leaving.  Therapy Documentation Precautions:  Precautions Precautions: Fall Precaution Comments: subluxation of LT UE noted; per family and patient, muscle wasting at LUE d/t prior injury (fall w/fracture) Restrictions Weight Bearing Restrictions:  Yes LLE Weight Bearing: Weight bearing as tolerated  See FIM for current functional status  Therapy/Group: Individual Therapy  Jerimey Burridge Raynell 04/02/2014, 12:19 PM

## 2014-04-02 NOTE — Progress Notes (Signed)
Speech Language Pathology Daily Session Note  Patient Details  Name: Erin [name] MRN: [id number] Date of Birth: 1929-11-07  Today's Date: 04/02/2014 SLP Individual Time: 0915-1015 SLP Individual Time Calculation (min): 60 min  Short Term Goals: Week 1: SLP Short Term Goal 1 (Week 1): Pt will utilize recommended swallowing precautions when consuming her currently prescribed diet with min assist  SLP Short Term Goal 2 (Week 1): Pt will improve alertness to sustain attention for 5-7 minutes with min assist.  SLP Short Term Goal 3 (Week 1): Pt will complete oral motor and pharyngeal strengthening exercises with min assist.  SLP Short Term Goal 4 (Week 1): Pt will use external aids to facilitate improved recall of daily information for 75% accuracy with min assist.   Skilled Therapeutic Interventions: Skilled treatment session focused on addressing cognitive-linguistic goals.  SLP facilitated session with education regarding impact of posture and vocal intensity of overall speech intelligibility.  Patient required Mod verbal cues to utilize compensatory strategies during a structured task.  SLP also facilitated session with 4 pictured items and Max question cues to identify which object was different from the rest.  Patient reported that it was difficult but that she could go home and do what she needed to take care of herself and as a result SLP reeducated patient on current deficits and purpose of therapy.  Patient also required Mod verbal cues to utilize working memory for recall of room number during route finding back to her room.    FIM:  Comprehension Comprehension Mode: Auditory Comprehension: 3-Understands basic 50 - 74% of the time/requires cueing 25 - 50%  of the time Expression Expression Mode: Verbal Expression: 3-Expresses basic 50 - 74% of the time/requires cueing 25 - 50% of the time. Needs to repeat parts of sentences. Social Interaction Social Interaction: 4-Interacts  appropriately 75 - 89% of the time - Needs redirection for appropriate language or to initiate interaction. Problem Solving Problem Solving: 2-Solves basic 25 - 49% of the time - needs direction more than half the time to initiate, plan or complete simple activities Memory Memory: 3-Recognizes or recalls 50 - 74% of the time/requires cueing 25 - 49% of the time  Pain Pain Assessment Pain Assessment: No/denies pain  Therapy/Group: Individual Therapy  Carmelia Roller., CCC-SLP [phone]  [place] 04/02/2014, 1:01 PM

## 2014-04-02 NOTE — Progress Notes (Signed)
NUTRITION FOLLOW-UP  INTERVENTION: Continue Magic cup TID between meals, each supplement provides 290 kcal and 9 grams of protein.  Encourage PO intake.  NUTRITION DIAGNOSIS: Inadequate oral intake related to dislike of food as evidenced by meal completion of 0-50%; ongoing  Goal: Pt to meet >/= 90% of their estimated nutrition needs; not met  Monitor:  PO intake, weight trends, labs, I/O's  80 y.o. female  Admitting Dx: CVA  ASSESSMENT: Pt with history of question Parkinson's disease followed by Dr. Jannifer Franklin, frequent falls, atrial fibrillation. Presented 03/20/2014 after mechanical fall. X-rays and imaging revealed a left intertrochanteric hip fracture. Cranial CT scan showed right parietal lobe distribution of the right middle cerebral artery consistent with acute to subacute ischemia.  9/18-Pt reports having a good appetite, however meal completion is 0-50%. Family reports pt may not like her dysphagia 1 diet as she would like to have "not mushy foods". Family reports prior to hospitalization pt was eating great with no difficulties. Pt reports she has not lost weight with her usual body weight of 157 lbs. Pt refused supplements,however is willing to consume Magic cup. Will order. Pt was encouraged to eat her food at meals to obtain adequate nutrition. Pt with no observed significant fat or muscle mass loss.  9/22- Meal completion is 10-40%. Pt reports her appetite has been improving and she has also been eating her Magic cups. Pt was fatigued and sleepy during time of visit. Pt was encouraged to eat her food at meals and to continue eating her Magic cups. Snacks and other supplements were offered to pt, however pt declined saying she will just continue her Magic cups.  Height: Ht Readings from Last 1 Encounters:  03/20/14 $RemoveB'5\' 6"'vwSKDbwj$  (1.676 m)    Weight: Wt Readings from Last 1 Encounters:  03/28/14 164 lb (74.39 kg)   BMI:  Body mass index is 26.48 kg/(m^2).  Re-Estimated  Nutritional Needs: Kcal: 1700-1900 Protein: 70-80 grams Fluid: 1.7 - 1.9 L/day  Skin: incision left leg and left hip, non-pitting LLE edema  Diet Order: Dysphagia 1 with honey thick liquids   Intake/Output Summary (Last 24 hours) at 04/02/14 1600 Last data filed at 04/02/14 1546  Gross per 24 hour  Intake    210 ml  Output   1475 ml  Net  -1265 ml    Last BM: 9/21  Labs:   Recent Labs Lab 03/27/14 0615 03/28/14 0430 03/29/14 0537  NA 140 143 144  K 3.8 4.1 4.1  CL 108 109 107  CO2 $Re'23 23 25  'NRw$ BUN $R'18 20 16  'IE$ CREATININE 0.46* 0.50 0.43*  CALCIUM 7.9* 7.8* 7.9*  GLUCOSE 119* 107* 109*    CBG (last 3)  No results found for this basename: GLUCAP,  in the last 72 hours  Scheduled Meds: . aspirin  325 mg Oral Daily  . atorvastatin  40 mg Oral q1800  . heparin  5,000 Units Subcutaneous 3 times per day  . metoprolol tartrate  25 mg Oral TID  . pantoprazole  40 mg Oral Daily  . senna-docusate  1 tablet Oral BID    Continuous Infusions: . sodium chloride 900 mL (04/01/14 1844)    Past Medical History  Diagnosis Date  . Anxiety   . Arthritis   . Hyperlipidemia   . Essential hypertension, benign   . Osteoporosis   . GERD (gastroesophageal reflux disease)   . Hearing loss   . Restless leg   . Tremor   . Leg edema   .  Obese   . Gall bladder disease   . Cataract   . Carpal tunnel syndrome of left wrist   . Atrial fibrillation   . Sleep apnea   . Esophageal dilatation   . Frequent falls   . Parkinson disease   . History of nuclear stress test 08/31/2012    Lexiscan cardiolite negative for ischemia  . Polyneuropathy in other diseases classified elsewhere 10/03/2013    Past Surgical History  Procedure Laterality Date  . Appendectomy    . Cholecystectomy    . Abdominal hysterectomy    . Fracture surgery      Left arm x4  . Benign cyst removed from kidney and lung, bilateral mastectomy    . Bilateral great toenail removal    . Knee arthroscopy  2012     Right knee, Dr Aline Brochure  . Left forearm    . Breast surgery      Bilateral 2000  . Tonsillectomy    . R hand surgery    . Bronchoscopy  02/15/2000  . Right vats.  "  . Right upper lobe wedge resection.  "  . Upper gastrointestinal endoscopy  11/25/1999    Esophagitis/ Normal proximal esophagus, stomach and duodenum  . Colonoscopy  01/17/2009    Dr. Deatra Ina : Internal hemorrhoids/Diverticula, scattered in the ascending colon/  Moderate diverticulosis ascending colon to sigmoid colon  . Esophagogastroduodenoscopy   04/23/2003    Dr. Deatra Ina: HIATAL HERNIA  . Colonoscopy  01/16/2001    Normal  . Cataract extraction, bilateral    . Esophagogastroduodenoscopy  03/20/2012    BMZ:TAEWYBRK ring-LIKELY CAUSING MILD DYSPHAGIA/SMALL hiatal hernia/Multiple sessile polyps ranging between 3-73mm , path benign  . Cataract extraction    . Carpal tunnel release      Left wrist  . Transthoracic echocardiogram  06/24/2009    EF=>55%, mild assymetric LVH; LA mildly dilated; mild mitral annular calcif, borderline MVP, mild-mod MR; mild-mod TR, RV systolic pressure elevated, mild pulm HTN; AV mildly sclerotic; mild pulm valve regurg - ordered r/t bradycardia   . Endovenous ablation saphenous vein w/ laser  01/2011    Right GSV  . Cholecystectomy    . Intramedullary (im) nail intertrochanteric Left 03/25/2014    Procedure: INTRAMEDULLARY NAIL INTERTROCHANTRIC LEFT HIP;  Surgeon: Marianna Payment, MD;  Location: [facility];  Service: Orthopedics;  Laterality: Left;    Kallie Locks, MS, Provisional LDN Pager # [phone] After hours/ weekend pager # [phone]

## 2014-04-02 NOTE — Progress Notes (Signed)
Physical Therapy Session Note  Patient Details  Name: Erin [name] MRN: [id number] Date of Birth: 03-Aug-1929  Today's Date: 04/02/2014 PT Individual Time: 1300-1400 PT Individual Time Calculation (min): 60 min   Short Term Goals: Week 1:  PT Short Term Goal 1 (Week 1): Pt will perform bilat rolling with bed rails and Max A of single therapist. PT Short Term Goal 2 (Week 1): Pt will perform supine<>sit with Max A of single therapist. PT Short Term Goal 3 (Week 1): Pt will transfer from bed<>w/c with Total A of single therapist. PT Short Term Goal 4 (Week 1): Pt will ambulate x10' in controlled environment with +2A. PT Short Term Goal 5 (Week 1): Pt will perform static sitting balance x60 seconds with min A of single therapist.  Skilled Therapeutic Interventions/Progress Updates:   Pt received lying in bed, c/o pain in back, but states she got pain meds approx 1 hour prior to session.  Pt reluctantly agreeable to participate.  Performed bed mobility with HOB flat and without rails to better simulate home.  Pt unable to recall technique for getting to EOB, but could remember using "board" to get into chair.  Re-educated on rolling to SL>sit with total A, however did not pt able to better self assist trunk today vs previous session.  Once on EOB, requires initially max A for sitting balance and to scoot to EOB, but with cues pt able to sit at [facility].  Note that she had not eaten any food yet, therefore goal of session to work on standing in standing frame while attempting to eat some of lunch for WB through LEs, posture, use of RUE and scanning L environment.  Pt able to perform two bouts of standing for up to 4 mins each with constant cues for upright trunk and head posture and for shifting weight to RLE.  Pt with pain in groin during transition but better once in standing.  Pt able to self feed for several bites with encouragement to continue and for safe swallow strategies.  Ended session working on  functional slideboard transfers w/c<>mat and w/c>bed.  Performed all at +2 assist (max to the R and total to the L with +2 for ensuring slideboard placement and securing chair.  Provided cues and assist for increased forward weight shift and WB through LEs to decrease shearing across board and to actually lift buttocks.  Pt did very well going to the R, as she could use UE to assist with weight shift.  Assisted back to room and back to bed in same manner.  Positioned for comfort and safety of LUE.  Educated son in room to visit with pt on her L to have her scan L environment.  Son verbalized understanding.  Left pt in bed with all needs in reach and bed alarm set.    Therapy Documentation Precautions:  Precautions Precautions: Fall Precaution Comments: subluxation of LT UE noted; per family and patient, muscle wasting at LUE d/t prior injury (fall w/fracture) Restrictions Weight Bearing Restrictions: Yes LLE Weight Bearing: Weight bearing as tolerated   Pain: Pain Assessment Pain Assessment: No/denies pain Pain Score: 8  Pain Type: Acute pain Pain Location: Hip Pain Orientation: Left Pain Descriptors / Indicators: Aching Pain Frequency: Intermittent Pain Intervention(s): Medication (See eMAR)     See FIM for current functional status  Therapy/Group: Individual Therapy  Denice Bors 04/02/2014, 1:58 PM

## 2014-04-02 NOTE — Progress Notes (Signed)
Note reviewed and accurately reflects treatment session.   

## 2014-04-03 ENCOUNTER — Inpatient Hospital Stay (HOSPITAL_COMMUNITY): Payer: Medicare Other

## 2014-04-03 ENCOUNTER — Encounter (HOSPITAL_COMMUNITY): Payer: Medicare Other | Admitting: Occupational Therapy

## 2014-04-03 ENCOUNTER — Inpatient Hospital Stay (HOSPITAL_COMMUNITY): Payer: Medicare Other | Admitting: Rehabilitation

## 2014-04-03 ENCOUNTER — Inpatient Hospital Stay (HOSPITAL_COMMUNITY): Payer: Medicare Other | Admitting: Speech Pathology

## 2014-04-03 MED ORDER — ESOMEPRAZOLE MAGNESIUM 20 MG PO CPDR
20.0000 mg | DELAYED_RELEASE_CAPSULE | Freq: Every day | ORAL | Status: DC
  Administered 2014-04-03 – 2014-04-04 (×2): 20 mg via ORAL
  Filled 2014-04-03 (×3): qty 1

## 2014-04-03 MED ORDER — PANTOPRAZOLE SODIUM 40 MG PO PACK
40.0000 mg | PACK | Freq: Every day | ORAL | Status: DC
  Filled 2014-04-03 (×2): qty 20

## 2014-04-03 MED ORDER — APIXABAN 5 MG PO TABS
5.0000 mg | ORAL_TABLET | Freq: Two times a day (BID) | ORAL | Status: DC
  Administered 2014-04-03 – 2014-04-17 (×29): 5 mg via ORAL
  Filled 2014-04-03 (×33): qty 1

## 2014-04-03 MED ORDER — TRAZODONE HCL 50 MG PO TABS
50.0000 mg | ORAL_TABLET | Freq: Every evening | ORAL | Status: DC | PRN
  Administered 2014-04-04 – 2014-04-16 (×10): 50 mg via ORAL
  Filled 2014-04-03 (×11): qty 1

## 2014-04-03 NOTE — Progress Notes (Signed)
Speech Language Pathology Daily Session Note  Patient Details  Name: Erin [name] MRN: [id number] Date of Birth: 01/23/1930  Today's Date: 04/03/2014 SLP Individual Time: 0915-1015 SLP Individual Time Calculation (min): 60 min  Short Term Goals: Week 1: SLP Short Term Goal 1 (Week 1): Pt will utilize recommended swallowing precautions when consuming her currently prescribed diet with min assist  SLP Short Term Goal 2 (Week 1): Pt will improve alertness to sustain attention for 5-7 minutes with min assist.  SLP Short Term Goal 3 (Week 1): Pt will complete oral motor and pharyngeal strengthening exercises with min assist.  SLP Short Term Goal 4 (Week 1): Pt will use external aids to facilitate improved recall of daily information for 75% accuracy with min assist.   Skilled Therapeutic Interventions: Skilled treatment session focused on addressing dysphagia and dysarthria goals.  SLP facilitated session with Mod verbal cues to attend to self-feeding and check for pocketing between bites.  Patient demonstrated right sided anterior loss of 50% of boluses Min tactile cues assisted, but patient was unable to carryover with continuous assist.  SLP also facilitated session with Max verbal cues to maintain upright posture to optimize PO intake and speech intelligibility as well as Mod verbal cues for increased intensity.  Continue with current plan of care.   FIM:  Comprehension Comprehension Mode: Auditory Comprehension: 4-Understands basic 75 - 89% of the time/requires cueing 10 - 24% of the time Expression Expression Mode: Verbal Expression: 3-Expresses basic 50 - 74% of the time/requires cueing 25 - 50% of the time. Needs to repeat parts of sentences. Social Interaction Social Interaction: 4-Interacts appropriately 75 - 89% of the time - Needs redirection for appropriate language or to initiate interaction. Problem Solving Problem Solving: 2-Solves basic 25 - 49% of the time - needs direction  more than half the time to initiate, plan or complete simple activities Memory Memory: 3-Recognizes or recalls 50 - 74% of the time/requires cueing 25 - 49% of the time FIM - Eating Eating Activity: 4: Helper checks for pocketed food;5: Needs verbal cues/supervision;4: Helper occasionally scoops food on utensil;4: Help with managing cup/glass  Pain Pain Assessment Pain Assessment: No/denies pain  Therapy/Group: Individual Therapy  Carmelia Roller., CCC-SLP [phone]  [place] 04/03/2014, 4:21 PM

## 2014-04-03 NOTE — Progress Notes (Signed)
Came to visit patient at bedside as she is active with [facility]. Made her aware that the [facility] is aware that she is in rehab and will continue to follow her upon discharge. Patient expressed appreciation of bedside visit.  Spoke with inpatient rehab LCSW about patient and her potential plans post discharge. Will continue to follow and collaborate and assist as needed. [facility] Management services will not interfere or replace services that arranged by inpatient LCSW, Will continue to follow. Marthenia Rolling, MSN- [facility] [phone]

## 2014-04-03 NOTE — Progress Notes (Signed)
Occupational Therapy Session Note  Patient Details  Name: Erin [name] MRN: [id number] Date of Birth: 1929/07/22  Today's Date: 04/03/2014 OT Individual Time: 1110-1200 OT Individual Time Calculation (min): 50 min    Short Term Goals: Week 1:  OT Short Term Goal 1 (Week 1): Patient will complete upper body bathing in supported seated position with mod A OT Short Term Goal 2 (Week 1): Patient will demonstrate improved attention to left as evidenced by dressing upper body with only setup assist OT Short Term Goal 3 (Week 1): Pt will tolerate standing at sink with max assist during assisted lower body dressing for 10 seconds OT Short Term Goal 4 (Week 1): Patient will complete toilet hygiene with mod assist to manage clothing OT Short Term Goal 5 (Week 1): Pt will demo ability to perform SROM at LUE with supervision      Skilled Therapeutic Interventions/Progress Updates:    Pt received sitting in w/c with family in room. Family left and pt engaged in B/D ADL retraining with a focus on head/neck/ postural control, sitting balance, and use of LUE. A rehab tech assisted this clinician throughout the session as pt requires max A x2 for all sit to stand, standing and transfers. Pt and family stated that pt was exhausted due to very little sleep and pt was eager to get in bed, but agreeable to participating as much as possible.  Pt sat in w/c at sink and engaged in UB self care actively lifting her LUE slightly to wash under left arm and apply deoderant. Pt stood 3x at sink for various steps in LB self care. Pt then requested to toilet, transferred with pt actively assisting using R hand on grab bar with max A x2.  Pt needed assist with cleansing and clothing management. She then transferred to bed and was positioned with L arm elevated. Pt worked on LUE AROM focusing on shoulder and elbow but continually fell asleep. Pt stated she could not stay awake any longer. Pt missed last 10 min of session. Call  light in reach and bed alarm set.  Therapy Documentation Precautions:  Precautions Precautions: Fall Precaution Comments: subluxation of LT UE noted; per family and patient, muscle wasting at LUE d/t prior injury (fall w/fracture) Restrictions Weight Bearing Restrictions: Yes LLE Weight Bearing: Weight bearing as tolerated General: General OT Amount of Missed Time: 10 Minutes pt fatigued    Pain: Pain Assessment Pain Assessment: No/denies pain  ADL: ADL ADL Comments: see FIM  See FIM for current functional status  Therapy/Group: Individual Therapy  [place] 04/03/2014, 1:41 PM

## 2014-04-03 NOTE — Progress Notes (Signed)
Note/chart reviewed. Agree with note.   Cannon Quinton RD, LDN, CNSC [phone] Pager [phone] After Hours Pager   

## 2014-04-03 NOTE — Progress Notes (Signed)
Physical Therapy Session Note  Patient Details  Name: Erin [name] MRN: [id number] Date of Birth: 15-Feb-1930  Today's Date: 04/03/2014 PT Individual Time: 1400-1430 PT Individual Time Calculation (min): 30 min   Short Term Goals: Week 1:  PT Short Term Goal 1 (Week 1): Pt will perform bilat rolling with bed rails and Max A of single therapist. PT Short Term Goal 2 (Week 1): Pt will perform supine<>sit with Max A of single therapist. PT Short Term Goal 3 (Week 1): Pt will transfer from bed<>w/c with Total A of single therapist. PT Short Term Goal 4 (Week 1): Pt will ambulate x10' in controlled environment with +2A. PT Short Term Goal 5 (Week 1): Pt will perform static sitting balance x60 seconds with min A of single therapist.  Skilled Therapeutic Interventions/Progress Updates:  1:1. Pt received semi-reclined in bed, ready for therapy. Focus this session on B LE therex to target strength and ROM. Pt with good tolerance to exercises, including 2x10 reps of: ankle pumps, SAQ, hip abd, heel slides and glute sets, all assisted L>R LE. Pt with progressive lethargy throughout session req max verbal cues to maintain alertness towards end of session. Did not attempted t/f sup>sit EOB for increased alertness due to lack of Ax2 persons. Pt left semi-reclined in bed at end of session, eyes closed w/ all needs in reach and bed alarm on.   Therapy Documentation Precautions:  Precautions Precautions: Fall Precaution Comments: subluxation of LT UE noted; per family and patient, muscle wasting at LUE d/t prior injury (fall w/fracture) Restrictions Weight Bearing Restrictions: Yes LLE Weight Bearing: Weight bearing as tolerated   Pain: Pain Assessment Pain Assessment: No/denies pain  See FIM for current functional status  Therapy/Group: Individual Therapy  Gilmore Laroche 04/03/2014, 2:33 PM

## 2014-04-03 NOTE — Patient Care Conference (Signed)
Inpatient RehabilitationTeam Conference and Plan of Care Update Date: 04/03/2014      Time: 11:59 AM    Patient Name: Erin [name] [facility]      Medical Record Number: [id number]  Date of Birth: 02/21/1930 Sex: Female         Room/Bed: 4W14C/4W14C-01 Payor Info: Payor: [employer] MEDICARE / Plan: [employer] MEDICARE CHOICE PPO / Product Type: *No Product type* /    Admitting Diagnosis: r parietal bg infarct l it hip fx  Admit Date/Time:  03/28/2014  7:06 PM Admission Comments: No comment available   Primary Diagnosis:  <principal problem not specified> Principal Problem: <principal problem not specified>  Patient Active Problem List   Diagnosis Date Noted  . CVA (cerebral infarction) 03/28/2014  . Cerebral embolism with cerebral infarction 03/23/2014  . Preoperative cardiovascular examination 03/21/2014  . Fracture, intertrochanteric, left femur 03/20/2014  . Fall at home 03/20/2014  . Hyperlipidemia with target LDL less than 100 02/16/2014  . Need for vaccination with 13-polyvalent pneumococcal conjugate vaccine 02/14/2014  . Acute cystitis 02/14/2014  . Allergic rhinitis 10/15/2013  . Insomnia 10/15/2013  . Polyneuropathy in other diseases classified elsewhere 10/03/2013  . Forgetfulness 08/05/2013  . Swelling of both ankles 06/25/2013  . Leg swelling 06/25/2013  . Acute pancreatitis 06/17/2013  . Chronic atrial fibrillation 04/09/2013  . Atypical chest pain 04/03/2013  . DNR (do not resuscitate) discussion 02/13/2013  . DNR (do not resuscitate) 02/13/2013  . Hearing loss 02/13/2013  . Muscle weakness (generalized) 11/24/2012  . Left shoulder pain 11/13/2012  . Unspecified constipation 09/05/2012  . H/O falling 08/01/2012  . Routine general medical examination at a health care facility 04/16/2012  . FH: colon cancer 02/23/2012  . Memory loss 11/08/2011  . Other general symptoms 11/08/2011  . Other vitamin B12 deficiency anemia 11/08/2011  . Essential and other specified forms of  tremor 11/08/2011  . Unsteady gait 10/14/2011  . Osteoporosis, senile   . Prediabetes 03/16/2011  . BURSITIS, RIGHT KNEE 01/05/2010  . SPINAL STENOSIS, LUMBAR 11/20/2009  . SCIATICA 11/13/2009  . OSTEOARTHRITIS, KNEE, RIGHT 09/11/2009  . UNSTEADY GAIT 09/11/2009  . MEDIAL MENISCUS TEAR, RIGHT 09/11/2009  . Essential hypertension, benign 01/02/2009  . GERD 01/02/2009  . HEMORRHAGE OF RECTUM AND ANUS 01/02/2009  . PAIN IN JOINT, HAND 11/20/2008  . Pain in joint, pelvic region and thigh 11/20/2008  . KNEE PAIN 11/20/2008  . ARTHRITIS, LEFT FOOT 07/17/2007    Expected Discharge Date: Expected Discharge Date: 04/18/14  Team Members Present: Physician leading conference: Dr. Alysia Penna Social Worker Present: Ovidio Kin, LCSW Nurse Present: Elliot Cousin, RN PT Present: Raylene Everts, PT;Emily Rinaldo Cloud, PT OT Present: Clyda Greener, OT SLP Present: Gunnar Fusi, SLP PPS Coordinator present : Daiva Nakayama, RN, CRRN     Current Status/Progress Goal Weekly Team Focus  Medical   R MCA, Left hip fx  maintain med stability  increase activity tolerance   Bowel/Bladder   Foley catheter.Continent to bowel  To assess foley cath closely,to continue continent to bowel.  To assess for foley cath discontinue,to assess bowel movements daily.   Swallow/Nutrition/ Hydration   Dys 1 textures and honey-thick liquids with Max assist   least restrictive PO intake Supervision   increase monitoring of right anterior loss as well as upright head posture   ADL's   mod A grooming and bathing, max UB dressing, total A x2 LB dressing, toileting, and all transfers  supervision UB dressing, eating, grooming; min A LB dressing, bathing, tub transfer, toilet  transfer  ADL retraining, LUE neuro reed, functional mobility, postural control, balance, pt/family education   Mobility   Pt is currenlty total A for bed mobility, total A to +2 for slideboard transfers, +2 for standing, and have only been able to  take 2-3 steps with pt via "three muskateer style."  Pt limited by decreased memory and awareness with poor carryover from session to session.   mod A overall (will likely D/C ambulation goals)  sitting balance, sit<>stands, slideboard transfers, activity tolerance, standing in standing frame, pt/family education   Communication   Mod assist   Supervision assist   increase head upright posture and increased vocal intensity   Safety/Cognition/ Behavioral Observations  Max assist   Min assist   increase left attention, intellectual awareness and basic problem solving    Pain    Using Oxi IR PRN.  To keep pain level at 2 on scale 1 to 10  To assess pain levels Q 2 hrs. and PRN   Skin   Surgical incision on left hip is heeling with out complications.  To keep incision free of infections,and skin with out pressure ulcers.  To assess skin Q. shift and PRN.      *See Care Plan and progress notes for long and short-term goals.  Barriers to Discharge: heavy physical assist , no defined caregiver    Possible Resolutions to Barriers:  may need SNF    Discharge Planning/Teaching Needs:  Family wants to take home unsure if realize how mcuh care pt will require at discharge.  Get with son's to discuss a realistic plan      Team Discussion:  Goals of mod assist wc level-will be much care for even a private duty caregiver.  recommend NHP from here and then hopefully home if can be managed.  Double whammy with CVA and hip fracture.  Will need 3 weeks to get to one person care, MD feels then ready for NH.  Revisions to Treatment Plan:  Probable NHP   Continued Need for Acute Rehabilitation Level of Care: The patient requires daily medical management by a physician with specialized training in physical medicine and rehabilitation for the following conditions: Daily direction of a multidisciplinary physical rehabilitation program to ensure safe treatment while eliciting the highest outcome that is of  practical value to the patient.: Yes Daily medical management of patient stability for increased activity during participation in an intensive rehabilitation regime.: Yes Daily analysis of laboratory values and/or radiology reports with any subsequent need for medication adjustment of medical intervention for : Neurological problems  Erin [name], [place] Rhyme 04/04/2014, 1:29 PM

## 2014-04-03 NOTE — Progress Notes (Signed)
Social Work Patient ID: Erin [name], female   DOB: August 26, 1929, 80 y.o.   MRN: [id number] Met with pt and spoke with son-Dale via telephone to discuss team conference goals-mod assist wheelchair level and discharge date 10/8.  Discussed the team along with MD feel the  Best option is to go to a NH upon discharge from here and then home if can be managed at home.  Pt states; " I have the insurance to cover this and will do what you all think is best." Son is aware of the options and feels this might be the best option also but want to meet with this worker and discuss along with his brother and his wife.  He will get with them and contact ' This worker back to set up a time.  Pt states; " I don't have that kind of money to hire help."  Will work on this plan and await son's call to set up meeting.

## 2014-04-03 NOTE — Progress Notes (Signed)
Physical Therapy Session Note  Patient Details  Name: Erin [name] MRN: [id number] Date of Birth: [birth year]  Today's Date: 04/03/2014 PT Individual Time: 0830-0900 PT Individual Time Calculation (min): 30 min   Short Term Goals: Week 1:  PT Short Term Goal 1 (Week 1): Pt will perform bilat rolling with bed rails and Max A of single therapist. PT Short Term Goal 2 (Week 1): Pt will perform supine<>sit with Max A of single therapist. PT Short Term Goal 3 (Week 1): Pt will transfer from bed<>w/c with Total A of single therapist. PT Short Term Goal 4 (Week 1): Pt will ambulate x10' in controlled environment with +2A. PT Short Term Goal 5 (Week 1): Pt will perform static sitting balance x60 seconds with min A of single therapist.  Skilled Therapeutic Interventions/Progress Updates:   Pt received lying in bed, agreeable to therapy.  Note pt states she did not sleep well last night and couldn't get comfortable.  Performed bed mobility with HOB flat and without rails to better simulate home/normal bed.  She was slightly better at recalling technique for rolling to the R, but requires max A and note she was better able to assist with RLE out of bed and elevating trunk.  Continues to require max A to scoot R hip to EOB in sitting as she uses extension pattern to scoot.  While on EOB, worked on sitting balance while RN providing morning medications.  Min to mod A for maintaining static sitting balance with continuous cues for forward weight shift and upright head/trunk posture.  She is able to correct when cued/facilitated, however unable to maintain.  Performed slideboard transfer bed>w/c with total A (+2 to secure SB and w/c) with pt demonstrating good recall of R lateral lean for SB placement, but cues and assist needed for forward weight shift and to elevate buttocks to prevent shearing across board.  Pt noted to be in better position in w/c with more upright posture and midline head posture.  PT went  to get L arm trough, however did not fit on current w/c, therefore donned half lap tray with blanket to fully support LUE and assist with more upright trunk posture.  Pt left in w/c in room with quick release belt donned and all needs in reach, sensitive call bell attached to blanket.    Therapy Documentation Precautions:  Precautions Precautions: Fall Precaution Comments: subluxation of LT UE noted; per family and patient, muscle wasting at LUE d/t prior injury (fall w/fracture) Restrictions Weight Bearing Restrictions: Yes LLE Weight Bearing: Weight bearing as tolerated   Pain: Pt with pain in L groin when flexing L hip.   See FIM for current functional status  Therapy/Group: Individual Therapy  Denice Bors 04/03/2014, 11:09 AM

## 2014-04-03 NOTE — Progress Notes (Signed)
Occupational Therapy Session Note  Patient Details  Name: Erin [name] MRN: [id number] Date of Birth: 1929-07-21  Today's Date: 04/03/2014 OT Individual Time: 1450-1506 OT Individual Time Calculation (min): 16 min   Skilled Therapeutic Interventions/Progress Updates:    Pt performed LUE AAROM exercises in supine during session.  She was able to elicit gross digit flexion and trace extension.  Noted arthritic limitations in the MPS and PIPs as well from previous fall, per pt report.  Performed 1 set of 10 repetitions for shoulder flexion, elbow flexion and extension, and digit flexion and extension.  Once exercises were completed had pt perform functional task of combing her hair with the LUE with mod facilitation, including holding the comb and washing her face.  Pt needed mod instructional cueing to maintain sustained attention as at times she would fall asleep.   Therapy Documentation Precautions:  Precautions Precautions: Fall Precaution Comments: subluxation of LT UE noted; per family and patient, muscle wasting at LUE d/t prior injury (fall w/fracture) Restrictions Weight Bearing Restrictions: No LLE Weight Bearing: Weight bearing as tolerated  Pain: Pain Assessment Pain Assessment: No/denies pain  See FIM for current functional status  Therapy/Group: Individual Therapy  Ashby Moskal OTR/L 04/03/2014, 3:46 PM

## 2014-04-03 NOTE — Progress Notes (Addendum)
80 y.o. right-handed female with history of question Parkinson's disease followed by Dr. Jannifer Franklin, frequent falls, atrial fibrillation on no anti-coagulation except aspirin due to frequent falls. Patient lives with family use and occasional cane walker when needed and was still driving. Presented 03/20/2014 after mechanical fall when she lost her balance landing on her left hip without loss of consciousness and was admitted to an [facility]. X-rays and imaging revealed a left intertrochanteric hip fracture. Patient noted to be confused with some left-sided weakness. Cranial CT scan showed right parietal lobe distribution of the right middle cerebral artery consistent with acute to subacute ischemia. CTA head and neck consistent with acute infarct right basal ganglia right parietal white matter. High-grade stenosis versus occlusion of distal right M1 segment. Neurology consulted. Patient did not receive TPA. Echocardiogram with ejection fraction of 60% no wall motion abnormalities. Venous Doppler studies lower extremities negative for DVT.  Subjective/Complaints: Up in chair, more alert, minimal groin pain, slept poorly, states that she does not have any family to take care of her.  Review of Systems - cannot obtain secondary to communication deficits from CVA  Objective: Vital Signs: Blood pressure 112/66, pulse 78, temperature 98.4 F (36.9 C), temperature source Oral, resp. rate 16, weight 78.563 kg (173 lb 3.2 oz), SpO2 98.00%. No results found. No results found for this or any previous visit (from the past 72 hour(s)).    Physical Exam  Eyes:  Pupils reactive to light  Neck: Neck supple. No thyromegaly present.  Cardiovascular:  Cardiac rate controlled  Respiratory: Breath sounds normal. No respiratory distress.  GI: Soft. Bowel sounds are normal. She exhibits no distension.  Neurological:  Speech is dysarthric. Left central 7 and tongue deviation. Pleasantly confused but was able to provide her  name age. She can provide the hospital subtle cues. Followed simple commands. Limited awareness of her deficits. She does have a right gaze preference. LUE is tr to 2/5 deltoid, bicep, tricep 0/5 Grip. LLE limited by pain. 1+ HF, 1 KE and ADF.APF. Can differentiate between painful and light touch. Normal strength on the right side  Skin:  Her hip incision is not tender, small amt ecchymosis lateral thigh, no drainage under foam dressing   Assessment/Plan: 1. Functional deficits secondary to left hemiparesis from right CVA as well as left intertrochanteric hip fracture status post ORIF 03/25/2014 which require 3+ hours per day of interdisciplinary therapy in a comprehensive inpatient rehab setting. Physiatrist is providing close team supervision and 24 hour management of active medical problems listed below. Physiatrist and rehab team continue to assess barriers to discharge/monitor patient progress toward functional and medical goals. Team conference today please see physician documentation under team conference tab, met with team face-to-face to discuss problems,progress, and goals. Formulized individual treatment plan based on medical history, underlying problem and comorbidities. FIM: FIM - Bathing Bathing Steps Patient Completed: Chest;Right upper leg;Left upper leg;Left Arm Bathing: 2: Max-Patient completes 3-4 44f 10 parts or 25-49%  FIM - Upper Body Dressing/Undressing Upper body dressing/undressing steps patient completed: Thread/unthread right sleeve of pullover shirt/dresss Upper body dressing/undressing: 1: Total-Patient completed less than 25% of tasks FIM - Lower Body Dressing/Undressing Lower body dressing/undressing: 1: Two helpers  FIM - Musician Devices: Grab bar or rail for support Toileting: 1: Two helpers  FIM - Radio producer Devices: Product manager Transfers: 1-Two helpers  FIM - Ship broker Devices: Sliding board;Arm rests Bed/Chair Transfer: 1: Supine > Sit: Total A (  helper does all/Pt. < 25%);1: Two helpers  FIM - Locomotion: Wheelchair Distance: 20 Locomotion: Wheelchair: 0: Activity did not occur FIM - Locomotion: Ambulation Locomotion: Ambulation Assistive Devices: Other (comment) (three muskateer style) Ambulation/Gait Assistance: 1: +2 Total assist Locomotion: Ambulation: 0: Activity did not occur  Comprehension Comprehension Mode: Auditory Comprehension: 3-Understands basic 50 - 74% of the time/requires cueing 25 - 50%  of the time  Expression Expression Mode: Verbal Expression: 3-Expresses basic 50 - 74% of the time/requires cueing 25 - 50% of the time. Needs to repeat parts of sentences.  Social Interaction Social Interaction: 4-Interacts appropriately 75 - 89% of the time - Needs redirection for appropriate language or to initiate interaction.  Problem Solving Problem Solving: 2-Solves basic 25 - 49% of the time - needs direction more than half the time to initiate, plan or complete simple activities  Memory Memory: 3-Recognizes or recalls 50 - 74% of the time/requires cueing 25 - 49% of the time Medical Problem List and Plan:  1. Functional deficits secondary to an embolic right parietal basal ganglia infarcts as well as left intertrochanteric hip fracture. Status post ORIF 03/25/2014. Weightbearing as tolerated , poor awareness of def 2. DVT Prophylaxis/Anticoagulation: Subcutaneous heparin. Monitor platelet counts any signs of bleeding. Venous Doppler studies negative  3. Pain Management: Oxycodone as needed. Monitor with increased mobility  4. Dysphagia. Dysphagia 1 honey thick liquids. Monitor hydration. Followup speech therapy  5. Neuropsych: This patient is not capable of making decisions on her own behalf.  6. Skin/Wound Care: Routine skin checks  7. Hypertension/atrial fibrillation. Lopressor 25 mg 3 times a day. Cardiac rate  control, Started on Eliquis per neuro Dr Xu 8. Hyperlipidemia. Lipitor  9. GERD. Protonix    LOS (Days) 6 A FACE TO FACE EVALUATION WAS PERFORMED  Erin [name] 04/03/2014, 9:20 AM

## 2014-04-04 ENCOUNTER — Inpatient Hospital Stay (HOSPITAL_COMMUNITY): Payer: Medicare Other | Admitting: Rehabilitation

## 2014-04-04 ENCOUNTER — Inpatient Hospital Stay (HOSPITAL_COMMUNITY): Payer: Medicare Other | Admitting: Occupational Therapy

## 2014-04-04 ENCOUNTER — Inpatient Hospital Stay (HOSPITAL_COMMUNITY): Payer: Medicare Other | Admitting: Speech Pathology

## 2014-04-04 LAB — URINALYSIS, ROUTINE W REFLEX MICROSCOPIC
Bilirubin Urine: NEGATIVE
Glucose, UA: NEGATIVE mg/dL
KETONES UR: NEGATIVE mg/dL
Nitrite: POSITIVE — AB
Protein, ur: NEGATIVE mg/dL
Specific Gravity, Urine: 1.014 (ref 1.005–1.030)
Urobilinogen, UA: 1 mg/dL (ref 0.0–1.0)
pH: 6.5 (ref 5.0–8.0)

## 2014-04-04 LAB — URINE MICROSCOPIC-ADD ON

## 2014-04-04 NOTE — Progress Notes (Signed)
Physical Therapy Session Note  Patient Details  Name: Erin [name] MRN: [id number] Date of Birth: 22-Nov-1929  Today's Date: 04/04/2014 PT Individual Time: 1130-1202 PT Individual Time Calculation (min): 32 min   Short Term Goals: Week 1:  PT Short Term Goal 1 (Week 1): Pt will perform bilat rolling with bed rails and Max A of single therapist. PT Short Term Goal 2 (Week 1): Pt will perform supine<>sit with Max A of single therapist. PT Short Term Goal 3 (Week 1): Pt will transfer from bed<>w/c with Total A of single therapist. PT Short Term Goal 4 (Week 1): Pt will ambulate x10' in controlled environment with +2A. PT Short Term Goal 5 (Week 1): Pt will perform static sitting balance x60 seconds with min A of single therapist.  Skilled Therapeutic Interventions/Progress Updates:   Pt received sitting in w/c in room, agreeable to session, but stating increased pain in low back.  Assisted pt to therapy gym and performed slideboard transfer w/c>mat at total A level (+2 for safety to secure SB and w/c).  Continue to provide facilitation for forward weight shift, and WB through LEs, however she is showing good improvement in recalling hand placement and where to lean to place board.  Once on EOM, utilized stedy to perform 3 reps of standing for up to 1-2 mins each with assist and cues for glute/quad activation in LLE, increasing R lateral trunk lean and upright head/trunk posture.  Tolerated well, however last stand requires +2 assist due to increasing fatigue, while other stands required max A.  Assisted pt back to w/c with stedy and assisted back to room and left in w/c with quick release belt donned and all needs in reach.    Therapy Documentation Precautions:  Precautions Precautions: Fall Precaution Comments: subluxation of LT UE noted; per family and patient, muscle wasting at LUE d/t prior injury (fall w/fracture) Restrictions Weight Bearing Restrictions: Yes LLE Weight Bearing: Weight  bearing as tolerated   Pain: Pain Assessment Pain Assessment: No/denies pain  See FIM for current functional status  Therapy/Group: Individual Therapy  Denice Bors 04/04/2014, 1:13 PM

## 2014-04-04 NOTE — Progress Notes (Signed)
80 y.o. right-handed female with history of question Parkinson's disease followed by Dr. Jannifer Franklin, frequent falls, atrial fibrillation on no anti-coagulation except aspirin due to frequent falls. Patient lives with family use and occasional cane walker when needed and was still driving. Presented 03/20/2014 after mechanical fall when she lost her balance landing on her left hip without loss of consciousness and was admitted to an [facility]. X-rays and imaging revealed a left intertrochanteric hip fracture. Patient noted to be confused with some left-sided weakness. Cranial CT scan showed right parietal lobe distribution of the right middle cerebral artery consistent with acute to subacute ischemia. CTA head and neck consistent with acute infarct right basal ganglia right parietal white matter. High-grade stenosis versus occlusion of distal right M1 segment. Neurology consulted. Patient did not receive TPA. Echocardiogram with ejection fraction of 60% no wall motion abnormalities. Venous Doppler studies lower extremities negative for DVT.  Subjective/Complaints: Slept ok, relief of Left groin pain with meds and ice  Review of Systems - cannot obtain secondary to communication deficits from CVA  Objective: Vital Signs: Blood pressure 135/49, pulse 72, temperature 98.6 F (37 C), temperature source Oral, resp. rate 17, weight 78.1 kg (172 lb 2.9 oz), SpO2 97.00%. No results found. No results found for this or any previous visit (from the past 72 hour(s)).    Physical Exam  Eyes:  Pupils reactive to light  Neck: Neck supple. No thyromegaly present.  Cardiovascular:  Cardiac rate controlled  Respiratory: Breath sounds normal. No respiratory distress.  GI: Soft. Bowel sounds are normal. She exhibits no distension.  Neurological:  Speech is dysarthric. Left central 7 and tongue deviation. Pleasantly confused but was able to provide her name age. She can provide the hospital subtle cues. Followed simple  commands. Limited awareness of her deficits. She does have a right gaze preference. LUE is tr to 2/5 deltoid, bicep, tricep 0/5 Grip. LLE limited by pain. 1+ HF, 1 KE and ADF.APF. Can differentiate between painful and light touch. Normal strength on the right side  Skin:  Her hip incision is not tender, small amt ecchymosis lateral thigh, no drainage under foam dressing   Assessment/Plan: 1. Functional deficits secondary to left hemiparesis from right CVA as well as left intertrochanteric hip fracture status post ORIF 03/25/2014 which require 3+ hours per day of interdisciplinary therapy in a comprehensive inpatient rehab setting. Physiatrist is providing close team supervision and 24 hour management of active medical problems listed below. Physiatrist and rehab team continue to assess barriers to discharge/monitor patient progress toward functional and medical goals.  FIM: FIM - Bathing Bathing Steps Patient Completed: Chest;Right upper leg;Left upper leg;Left Arm;Abdomen;Front perineal area Bathing: 3: Mod-Patient completes 5-7 69f 10 parts or 50-74%  FIM - Upper Body Dressing/Undressing Upper body dressing/undressing steps patient completed: Thread/unthread right sleeve of pullover shirt/dresss Upper body dressing/undressing: 1: Total-Patient completed less than 25% of tasks FIM - Lower Body Dressing/Undressing Lower body dressing/undressing: 1: Two helpers  FIM - Musician Devices: Grab bar or rail for support Toileting: 1: Two helpers  FIM - Radio producer Devices: Product manager Transfers: 1-Two helpers  FIM - Control and instrumentation engineer Devices: Sliding board;Arm rests Bed/Chair Transfer: 0: Activity did not occur  FIM - Locomotion: Wheelchair Distance: 20 Locomotion: Wheelchair: 0: Activity did not occur FIM - Locomotion: Ambulation Locomotion: Ambulation Assistive Devices: Other (comment) (three  muskateer style) Ambulation/Gait Assistance: 1: +2 Total assist Locomotion: Ambulation: 0: Activity did not occur  Comprehension  Comprehension Mode: Auditory Comprehension: 4-Understands basic 75 - 89% of the time/requires cueing 10 - 24% of the time  Expression Expression Mode: Verbal Expression: 3-Expresses basic 50 - 74% of the time/requires cueing 25 - 50% of the time. Needs to repeat parts of sentences.  Social Interaction Social Interaction: 4-Interacts appropriately 75 - 89% of the time - Needs redirection for appropriate language or to initiate interaction.  Problem Solving Problem Solving: 2-Solves basic 25 - 49% of the time - needs direction more than half the time to initiate, plan or complete simple activities  Memory Memory: 3-Recognizes or recalls 50 - 74% of the time/requires cueing 25 - 49% of the time Medical Problem List and Plan:  1. Functional deficits secondary to an embolic right parietal basal ganglia infarcts as well as left intertrochanteric hip fracture. Status post ORIF 03/25/2014. Weightbearing as tolerated , poor awareness of def 2. DVT Prophylaxis/Anticoagulation: Subcutaneous heparin. Monitor platelet counts any signs of bleeding. Venous Doppler studies negative  3. Pain Management: Oxycodone as needed. Monitor with increased mobility  4. Dysphagia. Dysphagia 1 honey thick liquids. Monitor hydration. Followup speech therapy  5. Neuropsych: This patient is not capable of making decisions on her own behalf.  6. Skin/Wound Care: Routine skin checks  7. Hypertension/atrial fibrillation. Lopressor 25 mg 3 times a day. Cardiac rate control, Started on Eliquis per neuro Dr Xu 8. Hyperlipidemia. Lipitor  9. GERD. Protonix    LOS (Days) 7 A FACE TO FACE EVALUATION WAS PERFORMED  KIRSTEINS,ANDREW E 04/04/2014, 8:50 AM

## 2014-04-04 NOTE — Progress Notes (Signed)
Speech Language Pathology Daily Session Note  Patient Details  Name: Erin [name] MRN: [id number] Date of Birth: 09-04-1929  Today's Date: 04/04/2014 SLP Individual Time: 0830-0910 SLP Individual Time Calculation (min): 40 min  Short Term Goals: Week 1: SLP Short Term Goal 1 (Week 1): Pt will utilize recommended swallowing precautions when consuming her currently prescribed diet with min assist  SLP Short Term Goal 2 (Week 1): Pt will improve alertness to sustain attention for 5-7 minutes with min assist.  SLP Short Term Goal 3 (Week 1): Pt will complete oral motor and pharyngeal strengthening exercises with min assist.  SLP Short Term Goal 4 (Week 1): Pt will use external aids to facilitate improved recall of daily information for 75% accuracy with min assist.   Skilled Therapeutic Interventions: Skilled treatment session focused on addressing dysphagia and cognition goals.  SLP facilitated session with set-up assist as needed and Min verbal cues to attend to self-feeding and manage left-sided anterior loss of boluses.  Patient with improved pace and self-monitoring of pocketing between bites today with no overt s/s of aspiration.  SLP also facilitated session with Max verbal cues to scan to left of breakfast tray and Mod cues to utilize external aids to assist with recall of date.  Continue with current plan of care.   FIM:  Comprehension Comprehension Mode: Auditory Comprehension: 4-Understands basic 75 - 89% of the time/requires cueing 10 - 24% of the time Expression Expression Mode: Verbal Expression: 3-Expresses basic 50 - 74% of the time/requires cueing 25 - 50% of the time. Needs to repeat parts of sentences. Social Interaction Social Interaction: 4-Interacts appropriately 75 - 89% of the time - Needs redirection for appropriate language or to initiate interaction. Problem Solving Problem Solving: 2-Solves basic 25 - 49% of the time - needs direction more than half the time to  initiate, plan or complete simple activities Memory Memory: 3-Recognizes or recalls 50 - 74% of the time/requires cueing 25 - 49% of the time FIM - Eating Eating Activity: 5: Needs verbal cues/supervision;4: Helper occasionally scoops food on utensil;4: Help with managing cup/glass  Pain Pain Assessment Pain Assessment: No/denies pain  Therapy/Group: Individual Therapy  Carmelia Roller., CCC-SLP [phone]  [place] 04/04/2014, 10:25 AM

## 2014-04-04 NOTE — Progress Notes (Signed)
Occupational Therapy Session Note  Patient Details  Name: NAOMI CASTROGIOVANNI MRN: [id number] Date of Birth: [birth year]  Today's Date: 04/04/2014 OT Individual Time: 1000-1100 and 230-330 OT Individual Time Calculation (min): 60 min and 60 min  Short Term Goals: Week 1:  OT Short Term Goal 1 (Week 1): Patient will complete upper body bathing in supported seated position with mod A OT Short Term Goal 2 (Week 1): Patient will demonstrate improved attention to left as evidenced by dressing upper body with only setup assist OT Short Term Goal 3 (Week 1): Pt will tolerate standing at sink with max assist during assisted lower body dressing for 10 seconds OT Short Term Goal 4 (Week 1): Patient will complete toilet hygiene with mod assist to manage clothing OT Short Term Goal 5 (Week 1): Pt will demo ability to perform SROM at LUE with supervision  Skilled Therapeutic Interventions/Progress Updates:  1)  Patient resting in bed upon arrival.  Engaged in self care retraining to include sponge bath at sink, dress and groom tasks.  Focused session on activity tolerance, safe slide board transfers, w/c positioning, sitting balance, sit><stand, standing balance and standing tolerance, attention to left visual field and left side of body, sustained muscle activation on left. Patient had difficulty with all aspects of bath and dress however, she attempted to assist as much as she could. Patient alert in beginning of session and appearing fatigued by end of session.  2)  Patient sleeping in w/c upon arrival.  Engaged in activity to attend to patients left, reviewed then demonstrated edema control methods and patient return demonstrated with mod-max assist.  Patient falling asleep several times during session.  Mod-max cues to attend to her left during session.  Engaged in w/c>bed slide board transfer, static sitting EOB, bed mobility to change brief and soiled pants, edema control, LUE A/AA/S/PROM, bed  positioning.  Therapy Documentation Precautions:  Precautions Precautions: Fall Precaution Comments: subluxation of LT UE noted; per family and patient, muscle wasting at LUE d/t prior injury (fall w/fracture) Restrictions Weight Bearing Restrictions: Yes LLE Weight Bearing: Weight bearing as tolerated Pain: 1)  No report of pain prior to activity.  Pain in groin then subsided each time after ~10 seconds and repositioned.  At end of session, patient report 6/10 pain in groin and RN provided medication. 2)  No report of pain prior to activity.  Pain in groin then subsided each time after ~10 seconds and repositioned.   ADL: See FIM for current functional status  Therapy/Group: Individual Therapy each session (rehab tech assisted in AM session)  [place], [place] 04/04/2014, 7:58 AM

## 2014-04-04 NOTE — Discharge Instructions (Signed)

## 2014-04-05 ENCOUNTER — Encounter (HOSPITAL_COMMUNITY): Payer: Medicare Other | Admitting: Occupational Therapy

## 2014-04-05 ENCOUNTER — Inpatient Hospital Stay (HOSPITAL_COMMUNITY): Payer: Medicare Other | Admitting: Rehabilitation

## 2014-04-05 ENCOUNTER — Inpatient Hospital Stay (HOSPITAL_COMMUNITY): Payer: Medicare Other | Admitting: Speech Pathology

## 2014-04-05 LAB — CBC WITH DIFFERENTIAL/PLATELET
BASOS PCT: 0 % (ref 0–1)
Basophils Absolute: 0 10*3/uL (ref 0.0–0.1)
EOS ABS: 0.1 10*3/uL (ref 0.0–0.7)
Eosinophils Relative: 1 % (ref 0–5)
HCT: 32.5 % — ABNORMAL LOW (ref 36.0–46.0)
HEMOGLOBIN: 10.5 g/dL — AB (ref 12.0–15.0)
Lymphocytes Relative: 13 % (ref 12–46)
Lymphs Abs: 1.1 10*3/uL (ref 0.7–4.0)
MCH: 32 pg (ref 26.0–34.0)
MCHC: 32.3 g/dL (ref 30.0–36.0)
MCV: 99.1 fL (ref 78.0–100.0)
MONOS PCT: 8 % (ref 3–12)
Monocytes Absolute: 0.7 10*3/uL (ref 0.1–1.0)
Neutro Abs: 6.7 10*3/uL (ref 1.7–7.7)
Neutrophils Relative %: 78 % — ABNORMAL HIGH (ref 43–77)
PLATELETS: 410 10*3/uL — AB (ref 150–400)
RBC: 3.28 MIL/uL — ABNORMAL LOW (ref 3.87–5.11)
RDW: 14.6 % (ref 11.5–15.5)
WBC: 8.6 10*3/uL (ref 4.0–10.5)

## 2014-04-05 MED ORDER — ESOMEPRAZOLE MAGNESIUM 20 MG PO CPDR
20.0000 mg | DELAYED_RELEASE_CAPSULE | Freq: Two times a day (BID) | ORAL | Status: DC
  Administered 2014-04-05 – 2014-04-17 (×25): 20 mg via ORAL
  Filled 2014-04-05 (×29): qty 1

## 2014-04-05 MED ORDER — ESOMEPRAZOLE MAGNESIUM 20 MG PO CPDR
20.0000 mg | DELAYED_RELEASE_CAPSULE | Freq: Two times a day (BID) | ORAL | Status: DC
  Filled 2014-04-05 (×3): qty 1

## 2014-04-05 MED ORDER — CEPHALEXIN 250 MG PO CAPS
250.0000 mg | ORAL_CAPSULE | Freq: Three times a day (TID) | ORAL | Status: AC
  Administered 2014-04-05 – 2014-04-09 (×15): 250 mg via ORAL
  Filled 2014-04-05 (×17): qty 1

## 2014-04-05 NOTE — Progress Notes (Signed)
Speech Language Pathology Weekly Progress Note  Patient Details  Name: Erin [name] MRN: [id number] Date of Birth: May 05, 1930  Beginning of progress report period: March 29, 2014 End of progress report period: April 05, 2014  Short Term Goals: Week 1: SLP Short Term Goal 1 (Week 1): Pt will utilize recommended swallowing precautions when consuming her currently prescribed diet with min assist  SLP Short Term Goal 1 - Progress (Week 1): Met SLP Short Term Goal 2 (Week 1): Pt will improve alertness to sustain attention for 5-7 minutes with min assist.  SLP Short Term Goal 2 - Progress (Week 1): Met SLP Short Term Goal 3 (Week 1): Pt will complete oral motor and pharyngeal strengthening exercises with min assist.  SLP Short Term Goal 3 - Progress (Week 1): Progressing toward goal SLP Short Term Goal 4 (Week 1): Pt will use external aids to facilitate improved recall of daily information for 75% accuracy with min assist.  SLP Short Term Goal 4 - Progress (Week 1): Progressing toward goal    New Short Term Goals: Week 2: SLP Short Term Goal 1 (Week 2): Pt will demonstrate effectient mastication of Dys 2 texture trilas with Mod assist  SLP Short Term Goal 2 (Week 2): Pt will complete oral motor and pharyngeal strengthening exercises with min assist.  SLP Short Term Goal 3 (Week 2): Pt will sustain attention to functional task for 10-12 minutes with Min assist   SLP Short Term Goal 4 (Week 2): Pt will use external aids to facilitate improved recall of daily information with Mod assist SLP Short Term Goal 5 (Week 2): Pt will scan to left during funcitonal tasks with Mod assist  SLP Short Term Goal 6 (Week 2): Pt will label 2 physicl and 1 cognitive deficits post CVA with Mod question cues  Weekly Progress Updates: Patient has made functional gains and has met 2 out of 4 short term goals this reporting period due to improved carryover of safe swallow strategies with Dys 1 textures and  honey-thick liquids as well as improved sustained attention for short periods of time.  Currently, patient continues to require Min-Mod assist when not fatigued and Max assist when fatigued.  Patient and family education ongoing.  Patient's poor intellectual awareness and left inattention appear to be very limiting factors and as a result, goals were added to address these impairments.   Patient would benefit from continued skilled SLP intervention to maximize her functional independence prior to discharge with 24/7 hands on assist.    Intensity: Minumum of 1-2 x/day, 30 to 90 minutes Frequency: 5 out of 7 days Duration/Length of Stay: 10/8 Treatment/Interventions: Oral motor exercises;Patient/family education;Functional tasks;Cueing hierarchy;Dysphagia/aspiration precaution training;Cognitive remediation/compensation;Internal/external aids;Speech/Language facilitation   Carmelia Roller., CCC-SLP [phone]   [place] 04/05/2014, 8:24 AM

## 2014-04-05 NOTE — Plan of Care (Signed)
Problem: RH Car Transfers Goal: LTG Patient will perform car transfers with assist (PT) LTG: Patient will perform car transfers with assistance (PT).  Outcome: Not Applicable Date Met:  94/49/67 Will D/C car transfer goal as pts D/C plan has changed to SNF.   Problem: RH Furniture Transfers Goal: LTG Patient will perform furniture transfers w/assist (OT/PT LTG: Patient will perform furniture transfers with assistance (OT/PT).  Outcome: Not Applicable Date Met:  59/16/38 Will D/C furniture transfer goal due to anticipated level of care at D/C and change in plan to SNF.   Problem: RH Ambulation Goal: LTG Patient will ambulate in controlled environment (PT) LTG: Patient will ambulate in a controlled environment, # of feet with assistance (PT).  Outcome: Not Applicable Date Met:  46/65/99 Do not anticipate pt being ambulatory upon D/c to SNF, therefore will D/C ambulation goals.   Problem: RH Wheelchair Mobility Goal: LTG Patient will propel w/c in home environment (PT) LTG: Patient will propel wheelchair in home environment, # of feet with assistance (PT).  Outcome: Not Applicable Date Met:  35/70/17 Will D/c home w/c mobility and community w/c mobility goals due to new D/C plan of SNF.

## 2014-04-05 NOTE — Progress Notes (Signed)
Speech Language Pathology Daily Session Note  Patient Details  Name: Erin [name] MRN: [id number] Date of Birth: 1929-09-24  Today's Date: 04/05/2014 SLP Individual Time: 1400-1500 SLP Individual Time Calculation (min): 60 min  Short Term Goals: Week 2: SLP Short Term Goal 1 (Week 2): Pt will demonstrate effectient mastication of Dys 2 texture trilas with Mod assist  SLP Short Term Goal 2 (Week 2): Pt will complete oral motor and pharyngeal strengthening exercises with min assist.  SLP Short Term Goal 3 (Week 2): Pt will sustain attention to functional task for 10-12 minutes with Min assist   SLP Short Term Goal 4 (Week 2): Pt will use external aids to facilitate improved recall of daily information with Mod assist SLP Short Term Goal 5 (Week 2): Pt will scan to left during funcitonal tasks with Mod assist  SLP Short Term Goal 6 (Week 2): Pt will label 2 physicl and 1 cognitive deficits post CVA with Mod question cues  Skilled Therapeutic Interventions: Skilled treatment session focused on cognitive-linguistic goals. Upon arrival, patient was awake while sitting upright in bed and reported she was "angry and confused."  SLP facilitated session by engaging in a functional conversation with focus on decreasing confusion, however, patient was perseverative on "not being in the right room" and "wanting to talk to [name])" despite Max A multimodal cues for redirection, therefore, [name] was called to the room and engaged in conversation which appeared to calm the patient and "resolve" her confusion.  This clinician sat in the patient's left field of environment throughout the session and the patient would turn her head to the left but kept her eyes closed throughout the majority of the session.  The patient independently reported she was incontinent of bladder and required Mod multimodal cues to attend to her LUE during bed mobility tasks. Patient was able to recall goals of PT ("standing tall  and transferring with a board") with Min A question cues but required total A for intellectual awareness in regards to her cognitive and swallowing impairments. At end of session, the patient independently reported the need to use the bedpan and required Mod A multimodal cues for attention to LUE during bed mobility tasks. Patient left supine in bed with bed alarm on. Continue with current plan of care.    FIM:  Comprehension Comprehension Mode: Auditory Comprehension: 4-Understands basic 75 - 89% of the time/requires cueing 10 - 24% of the time Expression Expression Mode: Verbal Expression: 4-Expresses basic 75 - 89% of the time/requires cueing 10 - 24% of the time. Needs helper to occlude trach/needs to repeat words. Social Interaction Social Interaction: 4-Interacts appropriately 75 - 89% of the time - Needs redirection for appropriate language or to initiate interaction. Problem Solving Problem Solving: 2-Solves basic 25 - 49% of the time - needs direction more than half the time to initiate, plan or complete simple activities Memory Memory: 3-Recognizes or recalls 50 - 74% of the time/requires cueing 25 - 49% of the time  Pain Pain Assessment Pain Assessment: No/denies pain  Therapy/Group: Individual Therapy  Kekai Geter, [place] 04/05/2014, 3:54 PM

## 2014-04-05 NOTE — Progress Notes (Signed)
Occupational Therapy Weekly Progress Note  Patient Details  Name: Erin [name] MRN: [id number] Date of Birth: 05/02/1930  Beginning of progress report period: March 30, 2014 End of progress report period: April 05, 2014  Today's Date: 04/05/2014 OT Individual Time:  7867- 0956   60 minutes of skilled OT intervention   Patient has met 2 of 5 short term goals. Pt is making slow progress towards remaining short term goals.  Patient continues to demonstrate the following deficits: increased fatigue, decreased strength, decreased seated and standing balance, decreased I in self care, decreased functional transfers/mobility and therefore will continue to benefit from skilled OT intervention to enhance overall performance with BADL.  Patient progressing toward long term goals..  Continue plan of care.  OT Short Term Goals Week 2:  OT Short Term Goal 1 (Week 2): Pt will demonstrate ability to perform SROM at LUE with supervision.  OT Short Term Goal 2 (Week 2): Pt will complete toliet hygiene with mod A of 1 for clothing management. OT Short Term Goal 3 (Week 2): Pt will tolerate standing with max a of 1 for LB dressing.  OT Short Term Goal 4 (Week 2): Pt will perform sliding board transfer from bed <> w/c with max A of 1.   Skilled Therapeutic Interventions/Progress Updates:  Upon entering the room, pt supine in bed with c/o stomach hurting. Pt stating, "I have to go to poop now." Due to urgency and no second assistant present at this time therapist placed pt onto bed pan and continued sitting up environment for ADL session. Pt required assist of 2 this session secondary to the amount of physical assistance required for pt to be successful and engage in session. Pt performed bathing and dressing seated on EOB this session. Pt required Max- Mod A for seated balance secondary to decreased core strength and posterior lean with all functional activities. Pt required max verbal cues as well  as assistance to hold onto R railing and lean forward for balance. Pt required max A of 2 to stand for LB dressing and bathing this session. Pt very fearful of falling and yelling while standing, "I can't. I can't. Y'all are gonna make me fall." Pt required max verbal cues for encouragement. Sliding board transfer bed> w/c with max A of 2 for safety. Once in wheelchair pt seated at sink side for grooming tasks. Family members coming into room as pt finishing session, w/c tilted back for safety, call bell within reach, and all needed items available upon exiting the room.   Therapy Documentation Precautions:  Precautions Precautions: Fall Precaution Comments: subluxation of LT UE noted; per family and patient, muscle wasting at LUE d/t prior injury (fall w/fracture) Restrictions Weight Bearing Restrictions: Yes LLE Weight Bearing: Weight bearing as tolerated Pain: Pain Assessment Pain Assessment: No/denies pain ADL: ADL ADL Comments: see FIM  See FIM for current functional status  Therapy/Group: Individual Therapy  Phineas Semen 04/05/2014, 5:18 PM

## 2014-04-05 NOTE — Plan of Care (Signed)
Problem: RH KNOWLEDGE DEFICIT Goal: RH STG INCREASE KNOWLEDGE OF DYSPHAGIA/FLUID INTAKE Patient and family will verbalize understanding of swallowing precautions with min assist prior to discharge  Outcome: Progressing Pt. And family educated on Dys. 1 diet,possible complications.

## 2014-04-05 NOTE — Progress Notes (Addendum)
80 y.o. right-handed female with history of question Parkinson's disease followed by Dr. Jannifer Franklin, frequent falls, atrial fibrillation on no anti-coagulation except aspirin due to frequent falls. Patient lives with family use and occasional cane walker when needed and was still driving. Presented 03/20/2014 after mechanical fall when she lost her balance landing on her left hip without loss of consciousness and was admitted to an [facility]. X-rays and imaging revealed a left intertrochanteric hip fracture. Patient noted to be confused with some left-sided weakness. Cranial CT scan showed right parietal lobe distribution of the right middle cerebral artery consistent with acute to subacute ischemia. CTA head and neck consistent with acute infarct right basal ganglia right parietal white matter. High-grade stenosis versus occlusion of distal right M1 segment. Neurology consulted. Patient did not receive TPA. Echocardiogram with ejection fraction of 60% no wall motion abnormalities. Venous Doppler studies lower extremities negative for DVT.  Subjective/Complaints: Oriented to self, says "I am at home now"  Review of Systems - cannot obtain secondary to communication deficits from CVA  Objective: Vital Signs: Blood pressure 132/64, pulse 65, temperature 98.2 F (36.8 C), temperature source Oral, resp. rate 16, weight 78.7 kg (173 lb 8 oz), SpO2 97.00%. No results found. Results for orders placed during the hospital encounter of 03/28/14 (from the past 72 hour(s))  URINALYSIS, ROUTINE W REFLEX MICROSCOPIC     Status: Abnormal   Collection Time    04/04/14  4:58 PM      Result Value Ref Range   Color, Urine YELLOW  YELLOW   APPearance TURBID (*) CLEAR   Specific Gravity, Urine 1.014  1.005 - 1.030   pH 6.5  5.0 - 8.0   Glucose, UA NEGATIVE  NEGATIVE mg/dL   Hgb urine dipstick MODERATE (*) NEGATIVE   Bilirubin Urine NEGATIVE  NEGATIVE   Ketones, ur NEGATIVE  NEGATIVE mg/dL   Protein, ur NEGATIVE  NEGATIVE  mg/dL   Urobilinogen, UA 1.0  0.0 - 1.0 mg/dL   Nitrite POSITIVE (*) NEGATIVE   Leukocytes, UA LARGE (*) NEGATIVE  URINE MICROSCOPIC-ADD ON     Status: Abnormal   Collection Time    04/04/14  4:58 PM      Result Value Ref Range   Squamous Epithelial / LPF FEW (*) RARE   WBC, UA TOO NUMEROUS TO COUNT  <3 WBC/hpf   RBC / HPF 3-6  <3 RBC/hpf   Bacteria, UA MANY (*) RARE      Physical Exam  Eyes:  Pupils reactive to light  Neck: Neck supple. No thyromegaly present.  Cardiovascular:  Cardiac rate controlled  Respiratory: Breath sounds normal. No respiratory distress.  GI: Soft. Bowel sounds are normal. She exhibits no distension.  Neurological:  Speech is dysarthric. Left central 7 and tongue deviation. Pleasantly confused but was able to provide her name age. She can provide the hospital subtle cues. Followed simple commands. Limited awareness of her deficits. She does have a right gaze preference. LUE is tr to 2/5 deltoid, bicep, tricep 0/5 Grip. LLE limited by pain. 1+ HF, 1 KE and ADF.APF. Can differentiate between painful and light touch. Normal strength on the right side  Skin:  Her hip incision is not tender, small amt ecchymosis lateral thigh, no drainage under foam dressing   Assessment/Plan: 1. Functional deficits secondary to left hemiparesis from right CVA as well as left intertrochanteric hip fracture status post ORIF 03/25/2014 which require 3+ hours per day of interdisciplinary therapy in a comprehensive inpatient rehab setting. Physiatrist is  providing close team supervision and 24 hour management of active medical problems listed below. Physiatrist and rehab team continue to assess barriers to discharge/monitor patient progress toward functional and medical goals.  FIM: FIM - Bathing Bathing Steps Patient Completed: Chest;Right upper leg;Left upper leg;Left Arm;Abdomen Bathing: 3: Mod-Patient completes 5-7 33f 10 parts or 50-74%  FIM - Upper Body  Dressing/Undressing Upper body dressing/undressing steps patient completed: Thread/unthread right sleeve of front closure shirt/dress Upper body dressing/undressing: 2: Max-Patient completed 25-49% of tasks FIM - Lower Body Dressing/Undressing Lower body dressing/undressing: 1: Two helpers  FIM - Musician Devices: Grab bar or rail for support Toileting: 1: Two helpers  FIM - Radio producer Devices: Product manager Transfers: 1-Two helpers  FIM - Financial planner Transfer: 1: Two helpers  FIM - Locomotion: Wheelchair Distance: 20 Locomotion: Wheelchair: 0: Activity did not occur FIM - Locomotion: Ambulation Locomotion: Ambulation Assistive Devices: Other (comment) (three muskateer style) Ambulation/Gait Assistance: 1: +2 Total assist Locomotion: Ambulation: 0: Activity did not occur  Comprehension Comprehension Mode: Auditory Comprehension: 4-Understands basic 75 - 89% of the time/requires cueing 10 - 24% of the time  Expression Expression Mode: Verbal Expression: 3-Expresses basic 50 - 74% of the time/requires cueing 25 - 50% of the time. Needs to repeat parts of sentences.  Social Interaction Social Interaction: 4-Interacts appropriately 75 - 89% of the time - Needs redirection for appropriate language or to initiate interaction.  Problem Solving Problem Solving: 2-Solves basic 25 - 49% of the time - needs direction more than half the time to initiate, plan or complete simple activities  Memory Memory: 3-Recognizes or recalls 50 - 74% of the time/requires cueing 25 - 49% of the time Medical Problem List and Plan:  1. Functional deficits secondary to an embolic right parietal basal ganglia infarcts as well as left intertrochanteric hip fracture. Status post ORIF 03/25/2014. Weightbearing as tolerated , poor awareness of def 2. DVT Prophylaxis/Anticoagulation:  Subcutaneous heparin. Monitor platelet counts any signs of bleeding. Venous Doppler studies negative  3. Pain Management: Oxycodone as needed. Monitor with increased mobility  4. Dysphagia. Dysphagia 1 honey thick liquids. Monitor hydration. Followup speech therapy  5. Neuropsych: This patient is not capable of making decisions on her own behalf.  6. Skin/Wound Care: Routine skin checks  7. Hypertension/atrial fibrillation. Lopressor 25 mg 3 times a day. Cardiac rate control, Started on Eliquis per neuro Dr Erlinda Hong, no sign of bleeding 8. Hyperlipidemia. Lipitor  9. GERD. Protonix 10.  UTI , empiric abx pending cx result   LOS (Days) 8 A FACE TO FACE EVALUATION WAS PERFORMED  KIRSTEINS,ANDREW E 04/05/2014, 8:06 AM

## 2014-04-05 NOTE — Progress Notes (Signed)
Physical Therapy Weekly Progress Note  Patient Details  Name: Erin [name] MRN: [id number] Date of Birth: 15-Oct-1929  Beginning of progress report period: March 29, 2014 End of progress report period: April 05, 2014  Today's Date: 04/05/2014 PT Individual Time: 1030-1130 PT Individual Time Calculation (min): 60 min   Patient has met 3 of 5 short term goals.  Pt making very slow progress with all aspects of mobility due to decreased participation, pain in L hip/groin, decreased strength in LUE/LE and poor posture and trunk control.  Feel she will likely need to D/C to SNF rather than going home due to anticipated level of care she will need and son that lives with her is on disability.  CSW aware and goals are to get pt to single person assist prior to D/C.   Patient continues to demonstrate the following deficits: decreased awareness, decreased memory, pain in LLE, decreased functional use of LUE/LE, decreased sitting and standing balance, decreased activity tolerance and motivation to participate and therefore will continue to benefit from skilled PT intervention to enhance overall performance with activity tolerance, balance, postural control, ability to compensate for deficits, functional use of  left upper extremity and left lower extremity, attention, awareness and knowledge of precautions.  Patient not progressing toward long term goals.  See goal revision..  Plan of care revisions: Will downgrade goals to mod A transfers of single therapist and D/C ambulation goals,  .  PT Short Term Goals Week 1:  PT Short Term Goal 1 (Week 1): Pt will perform bilat rolling with bed rails and Max A of single therapist. PT Short Term Goal 1 - Progress (Week 1): Met PT Short Term Goal 2 (Week 1): Pt will perform supine<>sit with Max A of single therapist. PT Short Term Goal 2 - Progress (Week 1): Met PT Short Term Goal 3 (Week 1): Pt will transfer from bed<>w/c with Total A of single  therapist. PT Short Term Goal 3 - Progress (Week 1): Progressing toward goal PT Short Term Goal 4 (Week 1): Pt will ambulate x10' in controlled environment with +2A. PT Short Term Goal 4 - Progress (Week 1): Not progressing PT Short Term Goal 5 (Week 1): Pt will perform static sitting balance x60 seconds with min A of single therapist. PT Short Term Goal 5 - Progress (Week 1): Met Week 2:     Skilled Therapeutic Interventions/Progress Updates:   Pt received sitting in w/c in room, friend and son present to observe during session.  Skilled session focused on functional slideboard transfers, sit<>stands with use of stedy and reaching activities in standing to provide trunk extension, trunk shortening/lengthening, L glute/quad activation during reaching tasks.  Performed slideboard transfer x 3 reps with total A (+2 for safety and to ensure placement of board and stability of w/c).  Pt requires mod cues to recall sequencing and technique for transfer with assist and facilitation for forward weight shift, hand placement and WB through LEs.  Tolerated several reps of sit<>stand with +2 assist (pt assist 30%) from stedy to perform reaching activity.  Max cues and facilitation for upright posture, L knee ext, glute activation and upright head during task.  Progressed to having pt lie on foam wedge with pillows to promote trunk extension, pectoral stretch, and cervical extension.  Added towel roll for increased extension.  Also worked on scooting forwards/backwards one hip at a time.  Requires total A to scoot, but pt did finally initiate scooting, but unable to fully move hip.  Assisted pt back to w/c and to bed as stated above.  Left with all needs in reach, bed alarm set and in good upright midline positioning.  Family in room.    Therapy Documentation Precautions:  Precautions Precautions: Fall Precaution Comments: subluxation of LT UE noted; per family and patient, muscle wasting at LUE d/t prior injury  (fall w/fracture) Restrictions Weight Bearing Restrictions: Yes LLE Weight Bearing: Weight bearing as tolerated   Vital Signs: Therapy Vitals Temp: 98.2 F (36.8 C) Temp src: Oral Pulse Rate: 65 Resp: 16 BP: 132/64 mmHg Patient Position (if appropriate): Lying Oxygen Therapy SpO2: 97 % O2 Device: None (Room air) Pain: Pain Assessment Pain Score: Asleep  See FIM for current functional status  Therapy/Group: Individual Therapy  Denice Bors 04/05/2014, 8:10 AM

## 2014-04-06 ENCOUNTER — Encounter (HOSPITAL_COMMUNITY): Payer: Medicare Other | Admitting: Occupational Therapy

## 2014-04-06 DIAGNOSIS — N3000 Acute cystitis without hematuria: Secondary | ICD-10-CM

## 2014-04-06 DIAGNOSIS — R5381 Other malaise: Secondary | ICD-10-CM

## 2014-04-06 DIAGNOSIS — I4891 Unspecified atrial fibrillation: Secondary | ICD-10-CM

## 2014-04-06 DIAGNOSIS — I6340 Cerebral infarction due to embolism of unspecified cerebral artery: Secondary | ICD-10-CM

## 2014-04-06 LAB — URINE CULTURE: Colony Count: >=100000

## 2014-04-06 MED ORDER — BISACODYL 10 MG RE SUPP
10.0000 mg | Freq: Every day | RECTAL | Status: DC | PRN
  Administered 2014-04-06 – 2014-04-14 (×2): 10 mg via RECTAL
  Filled 2014-04-06 (×2): qty 1

## 2014-04-06 NOTE — Progress Notes (Signed)
Occupational Therapy Session Note  Patient Details  Name: Erin [name] MRN: [id number] Date of Birth: February 22, 1930  Today's Date: 04/06/2014 OT Individual Time: 1100-1200 OT Individual Time Calculation (min): 60 min    Short Term Goals: Week 2:  OT Short Term Goal 1 (Week 2): Pt will demonstrate ability to perform SROM at LUE with supervision.  OT Short Term Goal 2 (Week 2): Pt will complete toliet hygiene with mod A of 1 for clothing management. OT Short Term Goal 3 (Week 2): Pt will tolerate standing with max a of 1 for LB dressing.  OT Short Term Goal 4 (Week 2): Pt will perform sliding board transfer from bed <> w/c with max A of 1.   Skilled Therapeutic Interventions/Progress Updates:  Upon entering the room pt supine in bed sleeping. Pt with no c/o pain this session. Pt continues to be very lethargic and falling asleep during session even with it being later in the afternoon. Session with focus on functional transfers, sitting balance, ADL training, bed mobility, and energy conservation. Pt requiring constant verbal cues to stay alert during session and pt having difficulty answering questions secondary to her falling asleep. Pt performed some bathing supine in bed and engaged in rolling to the L side with Mod A and washed her buttocks this session. Pt requiring max - total A for rolling towards the R side. Pt seated on EOB for ~ 15 minutes with Mod- Max A for dynamic sitting balance and verbal cues to lean forward with pt engaging in activities she tends to lean posterior. Sliding board transfer performed bed>w/c with Max A of 2 for safety and physical assist. Once in wheelchair pt performed grooming at sinkside. Pt requiring assistance to clean dentures secondary to fatigue. Upon exiting the room, pt tilted in wheelchair, R UE positioned, call bell within reach, and all needed items available and awaiting lunch.  Therapy Documentation Precautions:  Precautions Precautions:  Fall Precaution Comments: subluxation of LT UE noted; per family and patient, muscle wasting at LUE d/t prior injury (fall w/fracture) Restrictions Weight Bearing Restrictions: Yes LLE Weight Bearing: Weight bearing as tolerated ADL: ADL ADL Comments: see FIM  See FIM for current functional status  Therapy/Group: Individual Therapy  Phineas Semen 04/06/2014, 12:47 PM

## 2014-04-06 NOTE — Plan of Care (Signed)
Problem: RH BOWEL ELIMINATION Goal: RH STG MANAGE BOWEL WITH ASSISTANCE STG Manage Bowel with mod Assistance.  Outcome: Not Progressing NO bm since 9/21  Problem: RH SKIN INTEGRITY Goal: RH STG SKIN FREE OF INFECTION/BREAKDOWN Skin will be free of infection and no skin breakdown at discharge .  Outcome: Not Progressing Blister on left lateral foot. Foam dressing applied.

## 2014-04-06 NOTE — Progress Notes (Signed)
Patient ID: Erin [name], female   DOB: 03/01/1930, 80 y.o.   MRN: [id number]   04/06/14.  80 y.o. right-handed female with history of question Parkinson's disease followed by Dr. Jannifer Franklin, frequent falls, atrial fibrillation on no anti-coagulation except aspirin due to frequent falls.   Presented 03/20/2014 after mechanical fall when she lost her balance landing on her left hip without loss of consciousness and was admitted to an [facility]. X-rays and imaging revealed a left intertrochanteric hip fracture. Patient noted to be confused with some left-sided weakness. Cranial CT scan showed right parietal lobe distribution of the right middle cerebral artery consistent with acute to subacute ischemia. CTA head and neck consistent with acute infarct right basal ganglia right parietal white matter. High-grade stenosis versus occlusion of distal right M1 segment.  Neurology consulted. Patient did not receive TPA. Echocardiogram with ejection fraction of 60% no wall motion abnormalities. Venous Doppler studies lower extremities negative for DVT.  Past Medical History  Diagnosis Date  . Anxiety   . Arthritis   . Hyperlipidemia   . Essential hypertension, benign   . Osteoporosis   . GERD (gastroesophageal reflux disease)   . Hearing loss   . Restless leg   . Tremor   . Leg edema   . Obese   . Gall bladder disease   . Cataract   . Carpal tunnel syndrome of left wrist   . Atrial fibrillation   . Sleep apnea   . Esophageal dilatation   . Frequent falls   . Parkinson disease   . History of nuclear stress test 08/31/2012    Lexiscan cardiolite negative for ischemia  . Polyneuropathy in other diseases classified elsewhere 10/03/2013     Subjective/Complaints: Oriented to self, sleepy, no complaints  Review of Systems - cannot obtain secondary to communication deficits from CVA  Objective: Vital Signs: Blood pressure 127/53, pulse 77, temperature 98.5 F (36.9 C), temperature source Oral, resp.  rate 17, weight 78.7 kg (173 lb 8 oz), SpO2 98.00%. No results found. Results for orders placed during the hospital encounter of 03/28/14 (from the past 72 hour(s))  URINALYSIS, ROUTINE W REFLEX MICROSCOPIC     Status: Abnormal   Collection Time    04/04/14  4:58 PM      Result Value Ref Range   Color, Urine YELLOW  YELLOW   APPearance TURBID (*) CLEAR   Specific Gravity, Urine 1.014  1.005 - 1.030   pH 6.5  5.0 - 8.0   Glucose, UA NEGATIVE  NEGATIVE mg/dL   Hgb urine dipstick MODERATE (*) NEGATIVE   Bilirubin Urine NEGATIVE  NEGATIVE   Ketones, ur NEGATIVE  NEGATIVE mg/dL   Protein, ur NEGATIVE  NEGATIVE mg/dL   Urobilinogen, UA 1.0  0.0 - 1.0 mg/dL   Nitrite POSITIVE (*) NEGATIVE   Leukocytes, UA LARGE (*) NEGATIVE  URINE CULTURE     Status: None   Collection Time    04/04/14  4:58 PM      Result Value Ref Range   Specimen Description URINE, CATHETERIZED     Special Requests NONE     Culture  Setup Time       Value: 04/04/2014 17:53     Performed at [place]       Value: >=100,000 COLONIES/ML     Performed at [facility]   Culture       Value: ESCHERICHIA COLI     Performed at [facility]  Report Status PENDING    URINE MICROSCOPIC-ADD ON     Status: Abnormal   Collection Time    04/04/14  4:58 PM      Result Value Ref Range   Squamous Epithelial / LPF FEW (*) RARE   WBC, UA TOO NUMEROUS TO COUNT  <3 WBC/hpf   RBC / HPF 3-6  <3 RBC/hpf   Bacteria, UA MANY (*) RARE  CBC WITH DIFFERENTIAL     Status: Abnormal   Collection Time    04/05/14 10:05 AM      Result Value Ref Range   WBC 8.6  4.0 - 10.5 K/uL   RBC 3.28 (*) 3.87 - 5.11 MIL/uL   Hemoglobin 10.5 (*) 12.0 - 15.0 g/dL   HCT 32.5 (*) 36.0 - 46.0 %   MCV 99.1  78.0 - 100.0 fL   MCH 32.0  26.0 - 34.0 pg   MCHC 32.3  30.0 - 36.0 g/dL   RDW 14.6  11.5 - 15.5 %   Platelets 410 (*) 150 - 400 K/uL   Neutrophils Relative % 78 (*) 43 - 77 %   Neutro Abs 6.7  1.7 - 7.7  K/uL   Lymphocytes Relative 13  12 - 46 %   Lymphs Abs 1.1  0.7 - 4.0 K/uL   Monocytes Relative 8  3 - 12 %   Monocytes Absolute 0.7  0.1 - 1.0 K/uL   Eosinophils Relative 1  0 - 5 %   Eosinophils Absolute 0.1  0.0 - 0.7 K/uL   Basophils Relative 0  0 - 1 %   Basophils Absolute 0.0  0.0 - 0.1 K/uL    Patient Vitals for the past 24 hrs:  BP Temp Temp src Pulse Resp SpO2  04/06/14 0638 127/53 mmHg 98.5 F (36.9 C) Oral 77 17 98 %  04/05/14 2147 141/66 mmHg 98.2 F (36.8 C) Oral 98 17 96 %  04/05/14 1401 132/48 mmHg 98.4 F (36.9 C) Oral 86 17 96 %     Intake/Output Summary (Last 24 hours) at 04/06/14 1001 Last data filed at 04/05/14 1300  Gross per 24 hour  Intake    120 ml  Output      0 ml  Net    120 ml     Physical Exam  Eyes:  Pupils reactive to light  Neck: Neck supple. No thyromegaly present.  Cardiovascular:  Cardiac rate controlled  Respiratory: Breath sounds normal. No respiratory distress.  GI: Soft. Bowel sounds are normal. She exhibits no distension.  Neurological:  Speech is dysarthric. Left central 7 and tongue deviation. Pleasantly confused but was able to provide her name age. She can provide the hospital subtle cues. Followed simple commands. Limited awareness of her deficits. She does have a right gaze preference. LUE is tr to 2/5 deltoid, bicep, tricep 0/5 Grip. LLE limited by pain. 1+ HF, 1 KE and ADF.APF. Can differentiate between painful and light touch. Normal strength on the right side  Skin:  Her hip incision is not tender, small amt ecchymosis lateral thigh, no drainage under foam dressing   Assessment/Plan: 1. Functional deficits secondary to left hemiparesis from right CVA as well as left intertrochanteric hip fracture status post ORIF 03/25/2014 which require 3+ hours per day of interdisciplinary therapy in a comprehensive inpatient rehab setting. 2. DVT Prophylaxis/Anticoagulation: Subcutaneous heparin. Monitor platelet counts any signs of  bleeding. Venous Doppler studies negative  3. Pain Management: Oxycodone as needed. Monitor with increased mobility  4. Dysphagia.  Dysphagia 1 honey thick liquids. Monitor hydration. Followup speech therapy  5. Neuropsych: This patient is not capable of making decisions on her own behalf.  6. Skin/Wound Care: Routine skin checks  7. Hypertension/atrial fibrillation. Lopressor 25 mg 3 times a day. Cardiac rate control, Started on Eliquis per neuro Dr Erlinda Hong, no sign of bleeding 8. Hyperlipidemia. Lipitor  9. GERD. Protonix 10.  UTI , empiric abx pending cx result (E coli- sensitivities pending)   LOS (Days) 9 A FACE TO FACE EVALUATION WAS PERFORMED  Nyoka Cowden 04/06/2014, 9:59 AM

## 2014-04-06 NOTE — Progress Notes (Signed)
Patient requested RN check her rectum. Soft stool noted, but not impacted. Had moderate soft stool. Patrici Ranks A

## 2014-04-06 NOTE — Progress Notes (Signed)
LBM 09/21, requesting supp. Paged Dr. Asa Lente for supp. Order. Supp. Given at 2102, no results thus far. Patient reports taking a supp. everyday after lunch. "I've always had trouble with by bowels."  Erin [name] A

## 2014-04-07 ENCOUNTER — Inpatient Hospital Stay (HOSPITAL_COMMUNITY): Payer: Medicare Other

## 2014-04-07 NOTE — Progress Notes (Signed)
Occupational Therapy Session Note  Patient Details  Name: Erin [name] MRN: [id number] Date of Birth: Oct 15, 1929  Today's Date: 04/07/2014 OT Individual Time:  -   [id number]  (28 min)      Short Term Goals: Week 1:  OT Short Term Goal 1 (Week 1): Patient will complete upper body bathing in supported seated position with mod A OT Short Term Goal 1 - Progress (Week 1): Met OT Short Term Goal 2 (Week 1): Patient will demonstrate improved attention to left as evidenced by dressing upper body with only setup assist OT Short Term Goal 2 - Progress (Week 1): Progressing toward goal OT Short Term Goal 3 (Week 1): Pt will tolerate standing at sink with max assist during assisted lower body dressing for 10 seconds OT Short Term Goal 3 - Progress (Week 1): Met OT Short Term Goal 4 (Week 1): Patient will complete toilet hygiene with mod assist to manage clothing OT Short Term Goal 4 - Progress (Week 1): Progressing toward goal OT Short Term Goal 5 (Week 1): Pt will demo ability to perform SROM at LUE with supervision OT Short Term Goal 5 - Progress (Week 1): Progressing toward goal Week 2:  OT Short Term Goal 1 (Week 2): Pt will demonstrate ability to perform SROM at LUE with supervision.  OT Short Term Goal 2 (Week 2): Pt will complete toliet hygiene with mod A of 1 for clothing management. OT Short Term Goal 3 (Week 2): Pt will tolerate standing with max a of 1 for LB dressing.  OT Short Term Goal 4 (Week 2): Pt will perform sliding board transfer from bed <> w/c with max A of 1.   Skilled Therapeutic Interventions/Progress Updates:    Pt. Lying in bed upon OT arrival.  Grandsons and sister in law present.   Addressed NMRE to LUE, sitting balance, postural control, weight shifting, attending to the left, trunk stretching.  Pt. Was max assist to go from supine to sit.  Provided PROM to LUE with verbal explaination to family.  Sat on EOB with neck protraction, thoracic flexion, lumbar flexion.   Provided manual facilitation for corrective posture.  Addressed neck retraction and scapula adduction and depression.  Pt needed max multi modal cues for positioning and able to hold for 10 seconds before going back to previous posture.  Practiced weight shifting to Right side and going on elbow for balance.  Able to maintain for 10 seconds.  Did myotherapy on neck as pt more laterally flexed to one side.  Pt reported fatigue at end of session.  Positioned pt in bed with some correction for better aligned neck.  Pt. Left in bed with all needs in reach.    Therapy Documentation Precautions:  Precautions Precautions: Fall Precaution Comments: subluxation of LT UE noted; per family and patient, muscle wasting at LUE d/t prior injury (fall w/fracture) Restrictions Weight Bearing Restrictions: Yes LLE Weight Bearing: Weight bearing as tolerated   Pain: Pain Assessment Pain Assessment: 0-10--  4/10 Pain Type: Acute pain Pain Location: Neck Pain Orientation: middle Pain Descriptors / Indicators: Aching Pain Intervention(s):positioning ADL: ADL ADL Comments: see FIM     See FIM for current functional status  Therapy/Group: Individual Therapy  Lisa Roca 04/07/2014, 7:51 AM

## 2014-04-07 NOTE — Progress Notes (Signed)
Patient ID: Erin [name], female   DOB: 1930/05/23, 80 y.o.   MRN: [id number]  Patient ID: Erin [name], female   DOB: 01/08/1930, 79 y.o.   MRN: [id number]   04/07/14.  80 y.o. right-handed female with history of question Parkinson's disease followed by Dr. Jannifer Franklin, frequent falls, atrial fibrillation on no anti-coagulation except aspirin due to frequent falls.   Presented 03/20/2014 after mechanical fall when she lost her balance landing on her left hip without loss of consciousness and was admitted to an [facility]. X-rays and imaging revealed a left intertrochanteric hip fracture. Patient noted to be confused with some left-sided weakness. Cranial CT scan showed right parietal lobe distribution of the right middle cerebral artery consistent with acute to subacute ischemia. CTA head and neck consistent with acute infarct right basal ganglia right parietal white matter. High-grade stenosis versus occlusion of distal right M1 segment.  Neurology consulted. Patient did not receive TPA. Echocardiogram with ejection fraction of 60% no wall motion abnormalities. Venous Doppler studies lower extremities negative for DVT. Doing well today.  Some constipation issues but normal BM yesterday. C/o L thigh pain  Past Medical History  Diagnosis Date  . Anxiety   . Arthritis   . Hyperlipidemia   . Essential hypertension, benign   . Osteoporosis   . GERD (gastroesophageal reflux disease)   . Hearing loss   . Restless leg   . Tremor   . Leg edema   . Obese   . Gall bladder disease   . Cataract   . Carpal tunnel syndrome of left wrist   . Atrial fibrillation   . Sleep apnea   . Esophageal dilatation   . Frequent falls   . Parkinson disease   . History of nuclear stress test 08/31/2012    Lexiscan cardiolite negative for ischemia  . Polyneuropathy in other diseases classified elsewhere 10/03/2013     Subjective/Complaints: Oriented to self, sleepy, no complaints  Review of Systems -  L thigh  pain  Objective: Vital Signs: Blood pressure 146/75, pulse 89, temperature 97.4 F (36.3 C), temperature source Oral, resp. rate 16, weight 78.7 kg (173 lb 8 oz), SpO2 99.00%. No results found. Results for orders placed during the hospital encounter of 03/28/14 (from the past 72 hour(s))  URINALYSIS, ROUTINE W REFLEX MICROSCOPIC     Status: Abnormal   Collection Time    04/04/14  4:58 PM      Result Value Ref Range   Color, Urine YELLOW  YELLOW   APPearance TURBID (*) CLEAR   Specific Gravity, Urine 1.014  1.005 - 1.030   pH 6.5  5.0 - 8.0   Glucose, UA NEGATIVE  NEGATIVE mg/dL   Hgb urine dipstick MODERATE (*) NEGATIVE   Bilirubin Urine NEGATIVE  NEGATIVE   Ketones, ur NEGATIVE  NEGATIVE mg/dL   Protein, ur NEGATIVE  NEGATIVE mg/dL   Urobilinogen, UA 1.0  0.0 - 1.0 mg/dL   Nitrite POSITIVE (*) NEGATIVE   Leukocytes, UA LARGE (*) NEGATIVE  URINE CULTURE     Status: None   Collection Time    04/04/14  4:58 PM      Result Value Ref Range   Specimen Description URINE, CATHETERIZED     Special Requests NONE     Culture  Setup Time       Value: 04/04/2014 17:53     Performed at [place]       Value: >=100,000 COLONIES/ML  Performed at [facility]   Culture       Value: ESCHERICHIA COLI     Performed at [facility]   Report Status 04/06/2014 FINAL     Organism ID, Bacteria ESCHERICHIA COLI    URINE MICROSCOPIC-ADD ON     Status: Abnormal   Collection Time    04/04/14  4:58 PM      Result Value Ref Range   Squamous Epithelial / LPF FEW (*) RARE   WBC, UA TOO NUMEROUS TO COUNT  <3 WBC/hpf   RBC / HPF 3-6  <3 RBC/hpf   Bacteria, UA MANY (*) RARE  CBC WITH DIFFERENTIAL     Status: Abnormal   Collection Time    04/05/14 10:05 AM      Result Value Ref Range   WBC 8.6  4.0 - 10.5 K/uL   RBC 3.28 (*) 3.87 - 5.11 MIL/uL   Hemoglobin 10.5 (*) 12.0 - 15.0 g/dL   HCT 32.5 (*) 36.0 - 46.0 %   MCV 99.1  78.0 - 100.0 fL   MCH 32.0   26.0 - 34.0 pg   MCHC 32.3  30.0 - 36.0 g/dL   RDW 14.6  11.5 - 15.5 %   Platelets 410 (*) 150 - 400 K/uL   Neutrophils Relative % 78 (*) 43 - 77 %   Neutro Abs 6.7  1.7 - 7.7 K/uL   Lymphocytes Relative 13  12 - 46 %   Lymphs Abs 1.1  0.7 - 4.0 K/uL   Monocytes Relative 8  3 - 12 %   Monocytes Absolute 0.7  0.1 - 1.0 K/uL   Eosinophils Relative 1  0 - 5 %   Eosinophils Absolute 0.1  0.0 - 0.7 K/uL   Basophils Relative 0  0 - 1 %   Basophils Absolute 0.0  0.0 - 0.1 K/uL    Patient Vitals for the past 24 hrs:  BP Temp Temp src Pulse Resp SpO2  04/07/14 0625 146/75 mmHg 97.4 F (36.3 C) Oral 89 16 99 %  04/06/14 2107 137/57 mmHg 97.7 F (36.5 C) Oral 86 16 97 %  04/06/14 1430 124/61 mmHg 98.4 F (36.9 C) Oral 76 16 96 %     Intake/Output Summary (Last 24 hours) at 04/07/14 0909 Last data filed at 04/06/14 1300  Gross per 24 hour  Intake     60 ml  Output      0 ml  Net     60 ml     Physical Exam  Eyes:  Pupils reactive to light  Neck: Neck supple. No thyromegaly present.  Cardiovascular:  Cardiac rate controlled  Respiratory: Breath sounds normal. No respiratory distress.  GI: Soft. Bowel sounds are normal. She exhibits no distension.  Neurological:  Speech is dysarthric. Left central 7 and tongue deviation. Pleasantly confused but was able to provide her name age. She can provide the hospital subtle cues. Followed simple commands. Limited awareness of her deficits. She does have a right gaze preference. LUE is tr to 2/5 deltoid, bicep, tricep 0/5 Grip. LLE limited by pain. 1+ HF, 1 KE and ADF.APF. Can differentiate between painful and light touch. Normal strength on the right side  Skin:  Her hip incision is not tender, small amt ecchymosis lateral thigh, no drainage under foam dressing Extremities-  No thigh tenderness, edema or erythema   Assessment/Plan: 1. Functional deficits secondary to left hemiparesis from right CVA as well as left intertrochanteric hip  fracture status  post ORIF 03/25/2014 which require 3+ hours per day of interdisciplinary therapy in a comprehensive inpatient rehab setting. 2. DVT Prophylaxis/Anticoagulation: Subcutaneous heparin. Monitor platelet counts any signs of bleeding. Venous Doppler studies negative  3. Pain Management: Oxycodone as needed. Monitor with increased mobility  4. Dysphagia. Dysphagia 1 honey thick liquids. Monitor hydration. Followup speech therapy  5. Neuropsych: This patient is not capable of making decisions on her own behalf.  6. Skin/Wound Care: Routine skin checks  7. Hypertension/atrial fibrillation. Lopressor 25 mg 3 times a day. Cardiac rate control, Started on Eliquis per neuro Dr Erlinda Hong, no sign of bleeding 8. Hyperlipidemia. Lipitor  9. GERD. Protonix 10.  UTI   (E coli- sensitive to keflex)   LOS (Days) 10 A FACE TO FACE EVALUATION WAS PERFORMED  Nyoka Cowden 04/07/2014, 9:09 AM

## 2014-04-08 ENCOUNTER — Inpatient Hospital Stay (HOSPITAL_COMMUNITY): Payer: Medicare Other

## 2014-04-08 ENCOUNTER — Inpatient Hospital Stay (HOSPITAL_COMMUNITY): Payer: Medicare Other | Admitting: Speech Pathology

## 2014-04-08 ENCOUNTER — Inpatient Hospital Stay (HOSPITAL_COMMUNITY): Payer: Medicare Other | Admitting: Rehabilitation

## 2014-04-08 ENCOUNTER — Inpatient Hospital Stay (HOSPITAL_COMMUNITY): Payer: Medicare Other | Admitting: Occupational Therapy

## 2014-04-08 ENCOUNTER — Inpatient Hospital Stay (HOSPITAL_COMMUNITY): Payer: Medicare Other | Admitting: Physical Therapy

## 2014-04-08 DIAGNOSIS — G8110 Spastic hemiplegia affecting unspecified side: Secondary | ICD-10-CM

## 2014-04-08 DIAGNOSIS — S72009A Fracture of unspecified part of neck of unspecified femur, initial encounter for closed fracture: Secondary | ICD-10-CM

## 2014-04-08 DIAGNOSIS — Z5189 Encounter for other specified aftercare: Secondary | ICD-10-CM

## 2014-04-08 NOTE — Plan of Care (Signed)
Problem: RH BOWEL ELIMINATION Goal: RH STG MANAGE BOWEL W/MEDICATION W/ASSISTANCE STG Manage Bowel with Medication with max Assistance. Outcome: Not Progressing Unable to give self supp at this time.

## 2014-04-08 NOTE — Progress Notes (Signed)
Complained of left hip, left groin and left thigh pain. PRN tylenol given at 2127 with relief. Poor appetite. HS IVF's. Attempted to turn patient, but patient unable to tolerate laying on either side. Bilateral heels elevated off bed. Heels with foam dressings in place. Foam dressing to sacrum.  Wears SCD's intermittently during night. Multi stools since supp. Given on 09/26. Patrici Ranks A

## 2014-04-08 NOTE — Progress Notes (Signed)
Occupational Therapy Session Note  Patient Details  Name: Erin [name] MRN: [id number] Date of Birth: 1930/02/20  Today's Date: 04/08/2014 OT Individual Time: 1101-1201 OT Individual Time Calculation (min): 60 min    Short Term Goals: Week 2:  OT Short Term Goal 1 (Week 2): Pt will demonstrate ability to perform SROM at LUE with supervision.  OT Short Term Goal 2 (Week 2): Pt will complete toliet hygiene with mod A of 1 for clothing management. OT Short Term Goal 3 (Week 2): Pt will tolerate standing with max a of 1 for LB dressing.  OT Short Term Goal 4 (Week 2): Pt will perform sliding board transfer from bed <> w/c with max A of 1.   Skilled Therapeutic Interventions/Progress Updates:    Engaged in ADL retraining with focus on sit > stand, functional use of LUE, and increased participation in self-care tasks of bathing and dressing.  Pt received seated in w/c.  Engaged in bathing at sink with focus on pt managing LUE, pt demonstrating shoulder flexion and internal rotation with attempts to incorporate LUE into washing Rt arm.  Utilized hand over hand assist to incorporate LUE into washing Rt arm and applying deodorant.  Sit > stand with +2 assist to complete LB dressing, however upon standing pt reports "I'm peeing".  Returned to seated in w/c and pt requested to return to bed at be cleaned up.  Total assist +2 with slide board to Rt when returning to bed.  Engaged in rolling and bridging in bed to complete perineal hygiene and don pants.  Pt reports pain in groin during bending when completing LB bathing and when rolling in bed.  Therapy Documentation Precautions:  Precautions Precautions: Fall Precaution Comments: subluxation of LT UE noted; per family and patient, muscle wasting at LUE d/t prior injury (fall w/fracture) Restrictions Weight Bearing Restrictions: Yes LLE Weight Bearing: Weight bearing as tolerated Pain: Pain Assessment Pain Assessment: 0-10 Pain Score: 5  Pain  Type: Acute pain Pain Location: Groin Pain Orientation: Left Pain Descriptors / Indicators: Aching Pain Frequency: Intermittent Pain Onset: Gradual Patients Stated Pain Goal: 2 Pain Intervention(s): Medication (See eMAR);Repositioned Multiple Pain Sites: No ADL: ADL ADL Comments: see FIM  See FIM for current functional status  Therapy/Group: Individual Therapy  Simonne Come 04/08/2014, 12:30 PM

## 2014-04-08 NOTE — Progress Notes (Signed)
Speech Language Pathology Daily Session Note  Patient Details  Name: Erin [name] MRN: [id number] Date of Birth: [birth year]  Today's Date: 04/08/2014 SLP Individual Time: 2992-4268 SLP Individual Time Calculation (min): 30 min  Short Term Goals: Week 2: SLP Short Term Goal 1 (Week 2): Pt will demonstrate effectient mastication of Dys 2 texture trilas with Mod assist  SLP Short Term Goal 2 (Week 2): Pt will complete oral motor and pharyngeal strengthening exercises with min assist.  SLP Short Term Goal 3 (Week 2): Pt will sustain attention to functional task for 10-12 minutes with Min assist   SLP Short Term Goal 4 (Week 2): Pt will use external aids to facilitate improved recall of daily information with Mod assist SLP Short Term Goal 5 (Week 2): Pt will scan to left during funcitonal tasks with Mod assist  SLP Short Term Goal 6 (Week 2): Pt will label 2 physicl and 1 cognitive deficits post CVA with Mod question cues  Skilled Therapeutic Interventions: Skilled treatment session focused on cognitive and swallowing goals. SLP facilitated session with skilled observation with trials of honey thick liquids via cup sips and advanced Dys 2 textures. Pt required Min cues from SLP for management of anterior loss of solids, with pt reporting, "I can't feel that." Minimal pocketing was noted across a few trials; would recommend further observation prior to complete diet advancement. Delayed cough was noted x1 with Min verbal cues from SLP for utilization of swallowing strategies. Pt demonstrated intellectual awareness of 2 physical impairments with supervision level question cues from SLP, however required Min A for cognitive impairments. Pt participated in functional discussion related to return to daily functions with Mod A for anticipatory awareness. Continue plan of care.   FIM:  Comprehension Comprehension Mode: Auditory Comprehension: 5-Understands basic 90% of the time/requires cueing < 10% of  the time Expression Expression Mode: Verbal Expression: 4-Expresses basic 75 - 89% of the time/requires cueing 10 - 24% of the time. Needs helper to occlude trach/needs to repeat words. Social Interaction Social Interaction: 4-Interacts appropriately 75 - 89% of the time - Needs redirection for appropriate language or to initiate interaction. Problem Solving Problem Solving: 3-Solves basic 50 - 74% of the time/requires cueing 25 - 49% of the time Memory Memory: 3-Recognizes or recalls 50 - 74% of the time/requires cueing 25 - 49% of the time FIM - Eating Eating Activity: 4: Helper checks for pocketed food;5: Needs verbal cues/supervision;5: Set-up assist for open containers  Pain Pain Assessment Pain Assessment: No/denies pain  Therapy/Group: Individual Therapy   Germain Osgood, M.A. CCC-SLP [phone]  Germain Osgood 04/08/2014, 12:16 PM

## 2014-04-08 NOTE — IPOC Note (Signed)
Overall Plan of Care [facility]) Patient Details Name: Erin [name] MRN: [id number] DOB: 25-Aug-1929  Admitting Diagnosis: CVA, [place] [facility] Problems: Active Problems:   * No active hospital problems. *     Functional Problem List: Nursing    PT    OT    SLP    TR         Basic ADL's: OT       Advanced  ADL's: OT       Transfers: PT    OT       Locomotion: PT       Additional Impairments: OT    SLP        TR      Anticipated Outcomes Item Anticipated Outcome  Self Feeding    Swallowing      Basic self-care     Toileting      Bathroom Transfers    Bowel/Bladder     Transfers     Locomotion     Communication     Cognition     Pain     Safety/Judgment      Therapy Plan:             Team Interventions: Nursing Interventions    PT interventions    OT Interventions    SLP Interventions    TR Interventions    SW/CM Interventions      Team Discharge Planning: Destination: PT-  ,OT-   , SLP-  Projected Follow-up: PT- , OT-   , SLP-  Projected Equipment Needs: PT- , OT-  , SLP-  Equipment Details: PT- , OT-  Patient/family involved in discharge planning: PT-  ,  OT- , SLP-   MD ELOS: 18-22 d Medical Rehab Prognosis:  Good Assessment: 80 y.o. right-handed female with history of question Parkinson's disease followed by Dr. Jannifer Franklin, frequent falls, atrial fibrillation on no anti-coagulation except aspirin due to frequent falls. Patient lives with family use and occasional cane walker when needed and was still driving. Presented 03/20/2014 after mechanical fall when she lost her balance landing on her left hip without loss of consciousness and was admitted to an [facility]. X-rays and imaging revealed a left intertrochanteric hip fracture. Patient noted to be confused with some left-sided weakness. Cranial CT scan showed right parietal lobe distribution of the right middle cerebral artery consistent with acute to subacute ischemia. CTA head and  neck consistent with acute infarct right basal ganglia right parietal white matter. High-grade stenosis versus occlusion of distal right M1 segment.   Now requiring 24/7 Rehab RN,MD, as well as CIR level PT, OT and SLP.  Treatment team will focus on ADLs and mobility with goals set at [facility] A   See Team Conference Notes for weekly updates to the plan of care

## 2014-04-08 NOTE — Progress Notes (Signed)
   Subjective:  Patient reports pain as mainly in groin.  More alert.  Objective:   VITALS:   Filed Vitals:   04/07/14 0625 04/07/14 1400 04/07/14 2016 04/08/14 0500  BP: 146/75 117/54 141/63 128/73  Pulse: 89 97 83 82  Temp: 97.4 F (36.3 C) 97.7 F (36.5 C) 97.8 F (36.6 C) 97.5 F (36.4 C)  TempSrc: Oral Oral Oral Oral  Resp: 16 16 16 16   Weight:      SpO2: 99% 95% 96% 93%    Left sided weakness Incisions healed, no signs of infection.   Lab Results  Component Value Date   WBC 8.6 04/05/2014   HGB 10.5* 04/05/2014   HCT 32.5* 04/05/2014   MCV 99.1 04/05/2014   PLT 410* 04/05/2014     Assessment/Plan:      - WBAT LLE - xrays are stable - RN to remove staples - eliquis - f/u 4 weeks  Marianna Payment 04/08/2014, 8:06 AM [phone]

## 2014-04-08 NOTE — Progress Notes (Signed)
80 y.o. right-handed female with history of question Parkinson's disease followed by Dr. Jannifer Franklin, frequent falls, atrial fibrillation on no anti-coagulation except aspirin due to frequent falls. Patient lives with family use and occasional cane walker when needed and was still driving. Presented 03/20/2014 after mechanical fall when she lost her balance landing on her left hip without loss of consciousness and was admitted to an [facility]. X-rays and imaging revealed a left intertrochanteric hip fracture. Patient noted to be confused with some left-sided weakness. Cranial CT scan showed right parietal lobe distribution of the right middle cerebral artery consistent with acute to subacute ischemia. CTA head and neck consistent with acute infarct right basal ganglia right parietal white matter. High-grade stenosis versus occlusion of distal right M1 segment. Neurology consulted. Patient did not receive TPA. Echocardiogram with ejection fraction of 60% no wall motion abnormalities. Venous Doppler studies lower extremities negative for DVT.  Subjective/Complaints: No hip pain Asking when she can have normal food Discussed dysphagia post CVA  Review of Systems - cannot obtain secondary to communication deficits from CVA  Objective: Vital Signs: Blood pressure 128/73, pulse 82, temperature 97.5 F (36.4 C), temperature source Oral, resp. rate 16, weight 78.7 kg (173 lb 8 oz), SpO2 93.00%. Dg Femur Left  04/08/2014   CLINICAL DATA:  postop for intra medullary rod placement.  EXAM: LEFT FEMUR - 2 VIEW  COMPARISON:  Intraoperative films of 03/25/2014.  03/20/2014.  FINDINGS: Status post internal fixation of intertrochanteric femur fracture. Separate lesser trochanteric fracture fragment. Alignment is unchanged, without evidence of hardware complication.  IMPRESSION: Similar appearance of intertrochanteric femur fracture, status post fixation.   Electronically Signed   By: Abigail Miyamoto M.D.   On: 04/08/2014 08:09    Results for orders placed during the hospital encounter of 03/28/14 (from the past 72 hour(s))  CBC WITH DIFFERENTIAL     Status: Abnormal   Collection Time    04/05/14 10:05 AM      Result Value Ref Range   WBC 8.6  4.0 - 10.5 K/uL   RBC 3.28 (*) 3.87 - 5.11 MIL/uL   Hemoglobin 10.5 (*) 12.0 - 15.0 g/dL   HCT 32.5 (*) 36.0 - 46.0 %   MCV 99.1  78.0 - 100.0 fL   MCH 32.0  26.0 - 34.0 pg   MCHC 32.3  30.0 - 36.0 g/dL   RDW 14.6  11.5 - 15.5 %   Platelets 410 (*) 150 - 400 K/uL   Neutrophils Relative % 78 (*) 43 - 77 %   Neutro Abs 6.7  1.7 - 7.7 K/uL   Lymphocytes Relative 13  12 - 46 %   Lymphs Abs 1.1  0.7 - 4.0 K/uL   Monocytes Relative 8  3 - 12 %   Monocytes Absolute 0.7  0.1 - 1.0 K/uL   Eosinophils Relative 1  0 - 5 %   Eosinophils Absolute 0.1  0.0 - 0.7 K/uL   Basophils Relative 0  0 - 1 %   Basophils Absolute 0.0  0.0 - 0.1 K/uL      Physical Exam  Eyes:  Pupils reactive to light  Neck: Neck supple. No thyromegaly present.  Cardiovascular:  Cardiac rate controlled  Respiratory: Breath sounds normal. No respiratory distress.  GI: Soft. Bowel sounds are normal. She exhibits no distension.  Neurological:  Speech is dysarthric. Left central 7 and tongue deviation. Pleasantly confused but was able to provide her name age. She can provide the hospital subtle cues. Followed simple  commands. Limited awareness of her deficits. She does have a right gaze preference. LUE is tr to 2/5 deltoid, bicep, tricep 0/5 Grip. LLE limited by pain. 1+ HF, 1 KE and ADF.APF. Can differentiate between painful and light touch. Normal strength on the right side  Skin:  Her hip incision is not tender, small amt ecchymosis lateral thigh, no drainage under foam dressing   Assessment/Plan: 1. Functional deficits secondary to left hemiparesis from right CVA as well as left intertrochanteric hip fracture status post ORIF 03/25/2014 which require 3+ hours per day of interdisciplinary therapy in a  comprehensive inpatient rehab setting. Physiatrist is providing close team supervision and 24 hour management of active medical problems listed below. Physiatrist and rehab team continue to assess barriers to discharge/monitor patient progress toward functional and medical goals.  FIM: FIM - Bathing Bathing Steps Patient Completed: Chest;Left Arm;Abdomen;Front perineal area;Buttocks Bathing: 3: Mod-Patient completes 5-7 57f 10 parts or 50-74%  FIM - Upper Body Dressing/Undressing Upper body dressing/undressing steps patient completed: Thread/unthread right sleeve of front closure shirt/dress Upper body dressing/undressing: 0: Wears gown/pajamas-no public clothing FIM - Lower Body Dressing/Undressing Lower body dressing/undressing: 0: Wears Interior and spatial designer  FIM - Musician Devices: Grab bar or rail for support Toileting: 0: Activity did not occur  FIM - Radio producer Devices: Product manager Transfers: 0-Activity did not occur  FIM - Control and instrumentation engineer Devices: Sliding board;Arm rests Bed/Chair Transfer: 1: Two helpers  FIM - Locomotion: Wheelchair Distance: 20 Locomotion: Wheelchair: 0: Activity did not occur FIM - Locomotion: Ambulation Locomotion: Ambulation Assistive Devices: Other (comment) (three muskateer style) Ambulation/Gait Assistance: 1: +2 Total assist Locomotion: Ambulation: 0: Activity did not occur  Comprehension Comprehension Mode: Auditory Comprehension: 5-Follows basic conversation/direction: With no assist  Expression Expression Mode: Verbal Expression: 4-Expresses basic 75 - 89% of the time/requires cueing 10 - 24% of the time. Needs helper to occlude trach/needs to repeat words.  Social Interaction Social Interaction: 4-Interacts appropriately 75 - 89% of the time - Needs redirection for appropriate language or to initiate interaction.  Problem  Solving Problem Solving Mode: Asleep Problem Solving: 2-Solves basic 25 - 49% of the time - needs direction more than half the time to initiate, plan or complete simple activities  Memory Memory Mode: Asleep Memory: 3-Recognizes or recalls 50 - 74% of the time/requires cueing 25 - 49% of the time Medical Problem List and Plan:  1. Functional deficits secondary to an embolic right parietal basal ganglia infarcts as well as left intertrochanteric hip fracture. Status post ORIF 03/25/2014. Weightbearing as tolerated , poor awareness of def, appreciate Ortho f/u 2. DVT Prophylaxis/Anticoagulation: Subcutaneous heparin. Monitor platelet counts any signs of bleeding. Venous Doppler studies negative  3. Pain Management: Oxycodone as needed. Monitor with increased mobility  4. Dysphagia. Dysphagia 1 honey thick liquids. Monitor hydration. Followup speech therapy  5. Neuropsych: This patient is not capable of making decisions on her own behalf.  6. Skin/Wound Care: Routine skin checks  7. Hypertension/atrial fibrillation. Lopressor 25 mg 3 times a day. Cardiac rate control, Started on Eliquis per neuro Dr Erlinda Hong, no sign of bleeding 8. Hyperlipidemia. Lipitor  9. GERD. Protonix 10.  UTI , empiric abx pending cx result   LOS (Days) 11 A FACE TO FACE EVALUATION WAS PERFORMED  KIRSTEINS,ANDREW E 04/08/2014, 8:53 AM

## 2014-04-08 NOTE — Progress Notes (Signed)
Physical Therapy Session Note  Patient Details  Name: Erin [name] MRN: [id number] Date of Birth: [birth year]  Today's Date: 04/08/2014 PT Individual Time: 1300-1330 PT Individual Time Calculation (min): 30 min   Short Term Goals: Week 2:  PT Short Term Goal 1 (Week 2): Pt will perform bilat rolling with bed rails and Mod A of single therapist. PT Short Term Goal 2 (Week 2): Pt will perform supine<>sit with Mod A of single therapist. PT Short Term Goal 3 (Week 2): Pt will transfer from bed<>w/c with max A of single therapist. PT Short Term Goal 4 (Week 2): Pt will perform static sitting balance x60 seconds with S of single therapist.  Skilled Therapeutic Interventions/Progress Updates:   Pt received lying in bed in room, agreeable to therapy.  Pt was on bed pan, but states that she feels she is done.  Pt successful with voiding urine.  Performed several reps of rolling as in morning session to work on trunk dissociation, technique, attention to LUE and pushing weight through LLE.  Performing rolling R and L at mod A level with max cues to attend LUE and to flex LLE.  Provided total A for peri care and to don new brief.  Performed SL>sit at max A level with increased pain in L groin.  Max verbal cues and assist for LLE out of bed and to elevate trunk into sitting.  Once at EOB, pt performed scooting to EOB at max A level, better initiation of forward trunk movement.  Pt able to recall that she needed to lean onto R elbow to place board.  Nurse tech in room to place board and stabilize chair.  Note marked improvement in ability to scoot across board, however provided mod cues for increasing forward weight shift and increasing WB in LEs to elevate buttocks to avoid shearing. Once in w/c, performing seated LAQ's x 10 reps, active assisted on LLE.  Pt positioned in chair with half lap tray and tilted back.  Nurse tech aware so that he could assist with lunch. All needs in reach.   Therapy  Documentation Precautions:  Precautions Precautions: Fall Precaution Comments: subluxation of LT UE noted; per family and patient, muscle wasting at LUE d/t prior injury (fall w/fracture) Restrictions Weight Bearing Restrictions: Yes LLE Weight Bearing: Weight bearing as tolerated   Vital Signs: Therapy Vitals Temp: 98.2 F (36.8 C) Temp src: Oral Resp: 18 BP: 140/55 mmHg Patient Position (if appropriate): Sitting Oxygen Therapy SpO2: 97 % Pain: Pain Assessment Pain Assessment: 0-10 Pain Score: 5  Pain Type: Acute pain Pain Location: Groin Pain Orientation: Left Pain Descriptors / Indicators: Aching Pain Frequency: Intermittent Pain Onset: Gradual Patients Stated Pain Goal: 2 Pain Intervention(s): Medication (See eMAR);Repositioned Multiple Pain Sites: No  See FIM for current functional status  Therapy/Group: Individual Therapy  Denice Bors 04/08/2014, 1:47 PM

## 2014-04-08 NOTE — Progress Notes (Signed)
Physical Therapy Session Note  Patient Details  Name: Erin [name] MRN: [id number] Date of Birth: Nov 28, 1929  Today's Date: 04/08/2014 PT Individual Time: 0830-0930 PT Individual Time Calculation (min): 60 min   Short Term Goals: Week 2:     Skilled Therapeutic Interventions/Progress Updates:   Pt received lying in bed this morning, noting that she had not eaten breakfast yet.  Skilled session focused on bed mobility with rolling in order to work on trunk dissociation and abdominal activation, as well as LE control during rolling.  Performed 5 reps of rolling R and L with assist for LUE on therapist shoulder with cues to "push therapist away."  Pt requires cues for increasing forward trunk movement during rolling, however did well overall.  Note that brief soiled with urine and small amount of bowel, therefore performed rolling again x 2 to perform peri care and donned new brief.  Assisted to EOB via rolling R, pt moving LEs out of bed (assist for control out of bed).  Assisted with bringing LEs into more flexion to increase ease of weight shift over to L hip when transitioning from SL to sit.  Assist for trunk flex/rotation at mod/max A level.  Once at EOB, assisted pt with sitting balance while eating breakfast.  Facilitation at hips and trunk for anterior pelvic tilt, trunk extension, and open chest/upright head during swallowing.  Ended session with transfer into w/c via squat pivot at +2 assist (pt assist 30%) with facilitation for trunk flexion and increased WB through LEs.  Left pt in w/c tilted with all needs in reach.    Therapy Documentation Precautions:  Precautions Precautions: Fall Precaution Comments: subluxation of LT UE noted; per family and patient, muscle wasting at LUE d/t prior injury (fall w/fracture) Restrictions Weight Bearing Restrictions: Yes LLE Weight Bearing: Weight bearing as tolerated   Pain: Pt with pain in L groin with hip/knee flex, better with rest and  increased movement.   See FIM for current functional status  Therapy/Group: Individual Therapy (co-treat with clinical specialist as +2)  Fusako Tanabe, Betha Loa 04/08/2014, 12:04 PM

## 2014-04-09 ENCOUNTER — Inpatient Hospital Stay (HOSPITAL_COMMUNITY): Payer: Medicare Other | Admitting: Occupational Therapy

## 2014-04-09 ENCOUNTER — Inpatient Hospital Stay (HOSPITAL_COMMUNITY): Payer: Medicare Other | Admitting: Rehabilitation

## 2014-04-09 ENCOUNTER — Inpatient Hospital Stay (HOSPITAL_COMMUNITY): Payer: Medicare Other | Admitting: Speech Pathology
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
## 2014-04-09 NOTE — Progress Notes (Signed)
Occupational Therapy Session Note  Patient Details  Name: Erin [name] MRN: [id number] Date of Birth: May 04, 1930  Today's Date: 04/09/2014 OT Individual Time: 1300-1400 OT Individual Time Calculation (min): 60 min    Short Term Goals: Week 2:  OT Short Term Goal 1 (Week 2): Pt will demonstrate ability to perform SROM at LUE with supervision.  OT Short Term Goal 2 (Week 2): Pt will complete toliet hygiene with mod A of 1 for clothing management. OT Short Term Goal 3 (Week 2): Pt will tolerate standing with max a of 1 for LB dressing.  OT Short Term Goal 4 (Week 2): Pt will perform sliding board transfer from bed <> w/c with max A of 1.   Skilled Therapeutic Interventions/Progress Updates:  Patient resting in bed upon arrival.  Engaged in self care retraining to include sponge bath, dress and groom.  Focused session on rolling sidelying to sit EOB, sitting balance, increased use of LUE, sit><stand, standing balance, slide board transfer traveling to her left bed>w/c, and groom at sink.  Patient found to be incontinent of bowel and bladder during bath and required total assist to clean up and change brief.  Patient required close supervision to total assist with sitting balance EOB and began to fatigue and therefore requiring additional assist near end of session.  Patient +2 for sit><stand from EOB to pull up pants and +2 for safety for slide board transfer.  Therapy Documentation Precautions:  Precautions Precautions: Fall Precaution Comments: subluxation of LT UE noted; per family and patient, muscle wasting at LUE d/t prior injury (fall w/fracture) Restrictions Weight Bearing Restrictions: Yes LLE Weight Bearing: Weight bearing as tolerated Pain: Occasional c/o left groin pain during BADL, not rated, repositioned with stated relief. ADL: See FIM for current functional status  Therapy/Group: Individual Therapy  Khaza Blansett 04/09/2014, 5:38 PM

## 2014-04-09 NOTE — Progress Notes (Signed)
Physical Therapy Session Note  Patient Details  Name: Erin [name] MRN: [id number] Date of Birth: 1929/10/10  Today's Date: 04/09/2014 PT Individual Time: 1430-1530 PT Individual Time Calculation (min): 60 min   Short Term Goals: Week 2:  PT Short Term Goal 1 (Week 2): Pt will perform bilat rolling with bed rails and Mod A of single therapist. PT Short Term Goal 2 (Week 2): Pt will perform supine<>sit with Mod A of single therapist. PT Short Term Goal 3 (Week 2): Pt will transfer from bed<>w/c with max A of single therapist. PT Short Term Goal 4 (Week 2): Pt will perform static sitting balance x60 seconds with S of single therapist.  Skilled Therapeutic Interventions/Progress Updates:   Pt received sitting in w/c in room, requesting to use stedy to use bedside commode for bowel movement.  Prepared w/c and rehab tech retrieved stedy.  Pt requires +2 assist to stand (pt assist 30-40%) with max cues for increased forward weight shift and increasing WB through LEs.  Once in standing pt requires facilitation and cues for upright posture and increased weight shift to the R.  Pt able to correct posture, but unable to sustain.  Pt left on commode while discussing D/C plan and need for SNF in order to continue to work on increasing independence.  Pt verbalized understanding and agrees that sons cannot help her physically.  Pt successful in voiding bowel in commode, but only small amount.  Rehab tech noted that there was more fecal matter there but was hardened and unable to be voided.  RN called into room.   Pt continued to sit on stedy working on sitting balance and endurance.  Continued to require min/mod assist to maintain upright and midline posture as she tends to lean to the L.  Pt stood x 2 more reps to perform peri care and pull up brief and pants.  Assisted pt back to w/c and set up for squat pivot transfer to bed.  Performed at total A level (+2 for safety) with facilitation and cues for forward  weight shift and trunk lean with push through LEs to elevate buttocks.  Pt did very well with transfer and note improved WB through LEs.  Pt assisted into supine and then set up in bed to eat lunch.  Provided cues for throat clears and ensuring no pocketing of food in L cheek.  No overt s/s of aspiration during eating.  Also cues for smaller bites.  Pt left in bed with all needs in reach.  Nurse tech aware that nurse may still need to disimpact pt.   Therapy Documentation Precautions:  Precautions Precautions: Fall Precaution Comments: subluxation of LT UE noted; per family and patient, muscle wasting at LUE d/t prior injury (fall w/fracture) Restrictions Weight Bearing Restrictions: Yes LLE Weight Bearing: Weight bearing as tolerated   Pain: Pt with c/o pain in L groin with transitional movements, but improved with rest breaks.  See FIM for current functional status  Therapy/Group: Individual Therapy  Denice Bors 04/09/2014, 4:07 PM

## 2014-04-09 NOTE — Progress Notes (Signed)
Speech Language Pathology Daily Session Note  Patient Details  Name: Erin [name] MRN: [id number] Date of Birth: [birth year]  Today's Date: 04/09/2014 SLP Individual Time: 4696-2952 SLP Individual Time Calculation (min): 60 min  Short Term Goals: Week 2: SLP Short Term Goal 1 (Week 2): Pt will demonstrate effectient mastication of Dys 2 texture trilas with Mod assist  SLP Short Term Goal 2 (Week 2): Pt will complete oral motor and pharyngeal strengthening exercises with min assist.  SLP Short Term Goal 3 (Week 2): Pt will sustain attention to functional task for 10-12 minutes with Min assist   SLP Short Term Goal 4 (Week 2): Pt will use external aids to facilitate improved recall of daily information with Mod assist SLP Short Term Goal 5 (Week 2): Pt will scan to left during funcitonal tasks with Mod assist  SLP Short Term Goal 6 (Week 2): Pt will label 2 physicl and 1 cognitive deficits post CVA with Mod question cues  Skilled Therapeutic Interventions: Skilled treatment session focused on addressing dysphagia goals. SLP facilitated session with skilled observation of breakfast of Dys 1 textures and honey-thick liquids via cup sips as well as advanced trails of Dys 2 textures. Patient required Min cues from SLP for management of anterior loss of solids, which were faded to Supervision after education and implementation of wiping mouth after every other bite due to sensory impairments.  Patient demonstrated mild pocketing over the course of the meal.  Patient also demonstrated a cough x1 with Min verbal cues which SLP suspects was a result of pocketing build up.  SLP also facilitated session with education and instruction for accurate completion of pharyngeal strengthening exercises with Min cues by end of session. Recommend to continue Dys 2 trials with SLP.   FIM:  Comprehension Comprehension Mode: Auditory Comprehension: 5-Understands basic 90% of the time/requires cueing < 10% of the  time Expression Expression Mode: Verbal Expression: 4-Expresses basic 75 - 89% of the time/requires cueing 10 - 24% of the time. Needs helper to occlude trach/needs to repeat words. Social Interaction Social Interaction: 4-Interacts appropriately 75 - 89% of the time - Needs redirection for appropriate language or to initiate interaction. Problem Solving Problem Solving: 3-Solves basic 50 - 74% of the time/requires cueing 25 - 49% of the time Memory Memory: 3-Recognizes or recalls 50 - 74% of the time/requires cueing 25 - 49% of the time FIM - Eating Eating Activity: 4: Helper checks for pocketed food;5: Needs verbal cues/supervision;5: Set-up assist for open containers  Pain Pain Assessment Pain Assessment: No/denies pain  Therapy/Group: Individual Therapy  Carmelia Roller., CCC-SLP [phone]  [place] 04/09/2014, 10:49 AM

## 2014-04-09 NOTE — Progress Notes (Signed)
NUTRITION FOLLOW-UP  INTERVENTION: Continue Magic cup TID between meals, each supplement provides 290 kcal and 9 grams of protein.  Encourage PO intake.  NUTRITION DIAGNOSIS: Inadequate oral intake related to dislike of food as evidenced by meal completion of 0-50%; ongoing  Goal: Pt to meet >/= 90% of their estimated nutrition needs; not met  Monitor:  PO intake, weight trends, labs, I/O's  80 y.o. female  Admitting Dx: CVA  ASSESSMENT: Pt with history of question Parkinson's disease followed by Dr. Jannifer Franklin, frequent falls, atrial fibrillation. Presented 03/20/2014 after mechanical fall. X-rays and imaging revealed a left intertrochanteric hip fracture. Cranial CT scan showed right parietal lobe distribution of the right middle cerebral artery consistent with acute to subacute ischemia.  9/18-Pt reports having a good appetite, however meal completion is 0-50%. Family reports pt may not like her dysphagia 1 diet as she would like to have "not mushy foods". Family reports prior to hospitalization pt was eating great with no difficulties. Pt reports she has not lost weight with her usual body weight of 157 lbs. Pt refused supplements,however is willing to consume Magic cup. Will order. Pt was encouraged to eat her food at meals to obtain adequate nutrition. Pt with no observed significant fat or muscle mass loss.  9/22- Meal completion is 10-40%. Pt reports her appetite has been improving and she has also been eating her Magic cups. Pt was fatigued and sleepy during time of visit. Pt was encouraged to eat her food at meals and to continue eating her Magic cups. Snacks and other supplements were offered to pt, however pt declined saying she will just continue her Magic cups.  9/29- Meal completion has improved; PO: 30-40%. Pt is continuing trial of dysphagia 2 texture diet. Pt is demonstrating mild pocketing while eating, per SLP. Noted a 5.4% wt gain over the past 2-3 weeks, likely due to  improved PO intake. She continues to accept her Magic Cups well.   Height: Ht Readings from Last 1 Encounters:  03/20/14 _0  (1.676 m)    Weight: Wt Readings from Last 1 Encounters:  04/05/14 173 lb 8 oz (78.7 kg)  03/28/14  164 lb (74.39 kg)   BMI:  Body mass index is 28.02 kg/(m^2).  Re-Estimated Nutritional Needs: Kcal: 1700-1900 Protein: 84-94 grams Fluid: 1.7 - 1.9 L/day  Skin: incision left leg and left hip, non-pitting LLE edema  Diet Order: Dysphagia 1 with honey thick liquids   Intake/Output Summary (Last 24 hours) at 04/09/14 1343 Last data filed at 04/08/14 1900  Gross per 24 hour  Intake    180 ml  Output      0 ml  Net    180 ml    Last BM: 04/08/14  Labs:  No results found for this basename: NA, K, CL, CO2, BUN, CREATININE, CALCIUM, MG, PHOS, GLUCOSE,  in the last 168 hours  CBG (last 3)  No results found for this basename: GLUCAP,  in the last 72 hours  Scheduled Meds: . apixaban  5 mg Oral BID  . atorvastatin  40 mg Oral q1800  . cephALEXin  250 mg Oral 3 times per day  . esomeprazole  20 mg Oral BID  . metoprolol tartrate  25 mg Oral TID  . senna-docusate  1 tablet Oral BID    Continuous Infusions: . sodium chloride 75 mL/hr at 04/07/14 1749    Past Medical History  Diagnosis Date  . Anxiety   . Arthritis   . Hyperlipidemia   .  Essential hypertension, benign   . Osteoporosis   . GERD (gastroesophageal reflux disease)   . Hearing loss   . Restless leg   . Tremor   . Leg edema   . Obese   . Gall bladder disease   . Cataract   . Carpal tunnel syndrome of left wrist   . Atrial fibrillation   . Sleep apnea   . Esophageal dilatation   . Frequent falls   . Parkinson disease   . History of nuclear stress test 08/31/2012    Lexiscan cardiolite negative for ischemia  . Polyneuropathy in other diseases classified elsewhere 10/03/2013    Past Surgical History  Procedure Laterality Date  . Appendectomy    . Cholecystectomy    .  Abdominal hysterectomy    . Fracture surgery      Left arm x4  . Benign cyst removed from kidney and lung, bilateral mastectomy    . Bilateral great toenail removal    . Knee arthroscopy  2012    Right knee, Dr Aline Brochure  . Left forearm    . Breast surgery      Bilateral 2000  . Tonsillectomy    . R hand surgery    . Bronchoscopy  02/15/2000  . Right vats.  "  . Right upper lobe wedge resection.  "  . Upper gastrointestinal endoscopy  11/25/1999    Esophagitis/ Normal proximal esophagus, stomach and duodenum  . Colonoscopy  01/17/2009    Dr. Deatra Ina : Internal hemorrhoids/Diverticula, scattered in the ascending colon/  Moderate diverticulosis ascending colon to sigmoid colon  . Esophagogastroduodenoscopy   04/23/2003    Dr. Deatra Ina: HIATAL HERNIA  . Colonoscopy  01/16/2001    Normal  . Cataract extraction, bilateral    . Esophagogastroduodenoscopy  03/20/2012    ONG:EXBMWUXL ring-LIKELY CAUSING MILD DYSPHAGIA/SMALL hiatal hernia/Multiple sessile polyps ranging between 3-28m , path benign  . Cataract extraction    . Carpal tunnel release      Left wrist  . Transthoracic echocardiogram  06/24/2009    EF=>55%, mild assymetric LVH; LA mildly dilated; mild mitral annular calcif, borderline MVP, mild-mod MR; mild-mod TR, RV systolic pressure elevated, mild pulm HTN; AV mildly sclerotic; mild pulm valve regurg - ordered r/t bradycardia   . Endovenous ablation saphenous vein w/ laser  01/2011    Right GSV  . Cholecystectomy    . Intramedullary (im) nail intertrochanteric Left 03/25/2014    Procedure: INTRAMEDULLARY NAIL INTERTROCHANTRIC LEFT HIP;  Surgeon: NMarianna Payment MD;  [facility]  Service: Orthopedics;  Laterality: Left;    Larenz Frasier A. WJimmye Norman RD, LDN Pager: [phone] hours Pager: [phone]

## 2014-04-10 ENCOUNTER — Inpatient Hospital Stay (HOSPITAL_COMMUNITY): Payer: Medicare Other | Admitting: Rehabilitation

## 2014-04-10 ENCOUNTER — Inpatient Hospital Stay (HOSPITAL_COMMUNITY): Payer: Medicare Other | Admitting: Occupational Therapy

## 2014-04-10 NOTE — Progress Notes (Signed)
Physical Therapy Session Note  Patient Details  Name: Erin [name] MRN: [id number] Date of Birth: Aug 17, 1929  Today's Date: 04/10/2014 PT Co-Treatment Time: 1330 (whole session 1300-1400 w/ SLP)-1400 PT Co-Treatment Time Calculation (min): 30 min  Short Term Goals: Week 2:  PT Short Term Goal 1 (Week 2): Pt will perform bilat rolling with bed rails and Mod A of single therapist. PT Short Term Goal 2 (Week 2): Pt will perform supine<>sit with Mod A of single therapist. PT Short Term Goal 3 (Week 2): Pt will transfer from bed<>w/c with max A of single therapist. PT Short Term Goal 4 (Week 2): Pt will perform static sitting balance x60 seconds with S of single therapist.  Skilled Therapeutic Interventions/Progress Updates:   Pt received sitting in w/c in room, finishing lunch.  Skilled session co-treat with SLP to work on upright posture with standing while also addressing orientation to midline, scanning L environment, and upright head while working on swallowing with SLP (D2 textures).  Assisted pt with safe swallow strategies while seated in w/c in room and finishing lunch.  Then assisted pt to therapy gym at total A.  Assisted into standing frame for standing activity and tolerance.  Tolerated standing x 2 reps of approx 5-7 mins while working on increasing weight shift to the R, upright head, chest and trunk with therapist facilitating posture at ribs and chest.  Pt able to correct posture, however unable to maintain more than 15 secs.  While standing, also addressed scanning L environment for cup of water and SLP addressing swallow strategies with upgraded textures to prepare for modified during tomorrow's session.  See SLP note for details.  Pt assisted back to chair with increased pain in R calf (provided massage to decrease cramping).  Then assisted back to room and back to bed via squat pivot at max/total A level with continued facilitation for forward weight shift for increased buttock  clearance and WB through LEs.  Pt left in bed with all needs in reach and PRAFO's donned for pressure relief.    Therapy Documentation Precautions:  Precautions Precautions: Fall Precaution Comments: subluxation of LT UE noted; per family and patient, muscle wasting at LUE d/t prior injury (fall w/fracture) Restrictions Weight Bearing Restrictions: Yes LLE Weight Bearing: Weight bearing as tolerated   Vital Signs: Therapy Vitals Temp: 98.5 F (36.9 C) Temp src: Oral Pulse Rate: 70 Resp: 17 BP: 130/66 mmHg Patient Position (if appropriate): Lying Oxygen Therapy SpO2: 97 % O2 Device: None (Room air) Pain: Pain Assessment Pain Assessment: No/denies pain     See FIM for current functional status  Therapy/Group: Co-Treatment  [name], Betha Loa 04/10/2014, 6:29 PM

## 2014-04-10 NOTE — Progress Notes (Signed)
Social Work Elease Hashimoto, LCSW Social Worker Signed  Patient Care Conference Service date: 04/10/2014 1:46 PM  Inpatient RehabilitationTeam Conference and Plan of Care Update Date: 04/10/2014   Time: 11;30 AM     Patient Name: Erin [name]       Medical Record Number: [id number]   Date of Birth: [birth year] Sex: Female         Room/Bed: 4W14C/4W14C-01 Payor Info: Payor: [employer] MEDICARE / Plan: [employer] MEDICARE CHOICE PPO / Product Type: *No Product type* /   Admitting Diagnosis: r parietal bg infarct l it hip fx   Admit Date/Time:  03/28/2014  7:06 PM Admission Comments: No comment available   Primary Diagnosis:  <principal problem not specified> Principal Problem: <principal problem not specified>    Patient Active Problem List     Diagnosis  Date Noted   .  CVA (cerebral infarction)  03/28/2014   .  Cerebral embolism with cerebral infarction  03/23/2014   .  Preoperative cardiovascular examination  03/21/2014   .  Fracture, intertrochanteric, left femur  03/20/2014   .  Fall at home  03/20/2014   .  Hyperlipidemia with target LDL less than 100  02/16/2014   .  Need for vaccination with 13-polyvalent pneumococcal conjugate vaccine  02/14/2014   .  Acute cystitis  02/14/2014   .  Allergic rhinitis  10/15/2013   .  Insomnia  10/15/2013   .  Polyneuropathy in other diseases classified elsewhere  10/03/2013   .  Forgetfulness  08/05/2013   .  Swelling of both ankles  06/25/2013   .  Leg swelling  06/25/2013   .  Acute pancreatitis  06/17/2013   .  Chronic atrial fibrillation  04/09/2013   .  Atypical chest pain  04/03/2013   .  DNR (do not resuscitate) discussion  02/13/2013   .  DNR (do not resuscitate)  02/13/2013   .  Hearing loss  02/13/2013   .  Muscle weakness (generalized)  11/24/2012   .  Left shoulder pain  11/13/2012   .  Unspecified constipation  09/05/2012   .  H/O falling  08/01/2012   .  Routine general medical examination at a health care facility   04/16/2012   .  FH: colon cancer  02/23/2012   .  Memory loss  11/08/2011   .  Other general symptoms  11/08/2011   .  Other vitamin B12 deficiency anemia  11/08/2011   .  Essential and other specified forms of tremor  11/08/2011   .  Unsteady gait  10/14/2011   .  Osteoporosis, senile     .  Prediabetes  03/16/2011   .  BURSITIS, RIGHT KNEE  01/05/2010   .  SPINAL STENOSIS, LUMBAR  11/20/2009   .  SCIATICA  11/13/2009   .  OSTEOARTHRITIS, KNEE, RIGHT  09/11/2009   .  UNSTEADY GAIT  09/11/2009   .  MEDIAL MENISCUS TEAR, RIGHT  09/11/2009   .  Essential hypertension, benign  01/02/2009   .  GERD  01/02/2009   .  HEMORRHAGE OF RECTUM AND ANUS  01/02/2009   .  PAIN IN JOINT, HAND  11/20/2008   .  Pain in joint, pelvic region and thigh  11/20/2008   .  KNEE PAIN  11/20/2008   .  ARTHRITIS, LEFT FOOT  07/17/2007     Expected Discharge Date: Expected Discharge Date: 04/18/14  Team Members Present: Physician leading conference: Dr. Alysia Penna Social Worker  Present: Ovidio Kin, LCSW Nurse Present: Heather Roberts, RN PT Present: Raylene Everts, PT;Mikaiya Tramble Varner, PT;Emily Rinaldo Cloud, PT OT Present: Clyda Greener, Rhetta Mura, OT SLP Present: Gunnar Fusi, SLP PPS Coordinator present : Daiva Nakayama, RN, CRRN        Current Status/Progress  Goal  Weekly Team Focus   Medical     poor po fluid intake, caloric intake fair  improve po fluid intake  MBS   Bowel/Bladder     Incontinent of bowel and bladder  LBM 9/29  Mod assist  Monitor intake and output; attempt timed toileting at meals and bedtime   Swallow/Nutrition/ Hydration     Dys 1 textures and honey-thick liquids with Mod assist   least restrictive PO intake Supervision   objective reassessment    ADL's     Max A - Total A ( + 2 for safety) scoot pivot bed <> w/c, Min A grooming, bathing max A, UB dressing Max A, LB dressing - total A  supervision UB dressing, eating, grooming; min A LB dressing, bathing, toilet transfer  ADL  retraining, L UE neuro reed, functional transfers, postural control, balance sitting EOB and standing, pt/family education   Mobility     Pt is currently max A for bed mobility, min to mod for static sitting balance, max A (+2 for safety) with slideboard transfers, +2 for squat pivot transfers wthout board.  +2 for standing activities  mod A w/c level  sitting balance, functional transfers, sit<>stand, NMR for LUE/LE, activity tolerance, pt/family education.    Communication     Min-Mod assist   Supervision assist   continue to increase upright posture and vocal intensity   Safety/Cognition/ Behavioral Observations    Mod assist   Min assist   increase left attention, awareness and basic problem solving    Pain     controlled with prn oxycodone  < 3  Assess and treat for pain q shift and prn   Skin     Incision to left hip- foam dressing in place; blister to left heel- foam in place; redness to sacrum- foam dressing in place  Remain free from further breakdown or infection with mod assist  Assess skin q shift and prn     *See Care Plan and progress notes for long and short-term goals.    Barriers to Discharge:  dysphagia     Possible Resolutions to Barriers:    SNF     Discharge Planning/Teaching Needs:    Family on board with NH option, looking at area facilities.  Still very optimistic regarding progress and getting home      Team Discussion:    Making progress, but not enough to be able to return home form here.  Sensation absent on her left side.  MBS tomorrow-Dys 1 honey thick currently.  Timed tolieting schedule.     Revisions to Treatment Plan:    Medically ready for NH search    Continued Need for Acute Rehabilitation Level of Care: The patient requires daily medical management by a physician with specialized training in physical medicine and rehabilitation for the following conditions: Daily direction of a multidisciplinary physical rehabilitation program to ensure safe  treatment while eliciting the highest outcome that is of practical value to the patient.: Yes Daily analysis of laboratory values and/or radiology reports with any subsequent need for medication adjustment of medical intervention for : Neurological problems;Post surgical problems  Keaundra Stehle, Gardiner Rhyme 04/10/2014, 1:46 PM  Patient ID: Erin [name], female   DOB: 07/16/1929, 80 y.o.   MRN: [id number]

## 2014-04-10 NOTE — Progress Notes (Signed)
Occupational Therapy Session Note  Patient Details  Name: Erin [name] MRN: [id number] Date of Birth: [birth year]  Today's Date: 04/10/2014 OT Individual Time: 8366-2947 OT Individual Time Calculation (min): 60 min    Short Term Goals: Week 2:  OT Short Term Goal 1 (Week 2): Pt will demonstrate ability to perform SROM at LUE with supervision.  OT Short Term Goal 2 (Week 2): Pt will complete toliet hygiene with mod A of 1 for clothing management. OT Short Term Goal 3 (Week 2): Pt will tolerate standing with max a of 1 for LB dressing.  OT Short Term Goal 4 (Week 2): Pt will perform sliding board transfer from bed <> w/c with max A of 1.   Skilled Therapeutic Interventions/Progress Updates:  Pt supine in bed upon entering the room. Pt appearing to be more alert during this session. Session with focus on energy conservation, sitting balance, ADL training (hemi techniques and gross motor assist for R UE), functional transfers, and activity tolerance. Pt seated EOB with SBA - Total A for sitting balance during functional ADL tasks of dressing and bathing. When pt focusing on washing or dressing self she goes into posterior lean and unable to correct self without physical assistance. Pt holding onto R bed rail in static sitting with SBA- min A balance. Pt stood with Max A for LB dressing with second helper performing clothing management. OT continued to discuss energy conservation in order to keep pt engaged during session. Pt transferred with total A of scoot pivot towards L side bed >w/c this session. Pt performed grooming at sinkside seated in wheelchair. Goals downgraded this session secondary to slow progress due to fatigue and strength. Pt seated in tilted wheelchair with call bell and all other needed items within reach upon therapist exiting.   Therapy Documentation Precautions:  Precautions Precautions: Fall Precaution Comments: subluxation of LT UE noted; per family and patient, muscle  wasting at LUE d/t prior injury (fall w/fracture) Restrictions Weight Bearing Restrictions: Yes LLE Weight Bearing: Weight bearing as tolerated ADL: ADL ADL Comments: see FIM  See FIM for current functional status  Therapy/Group: Individual Therapy  Phineas Semen 04/10/2014, 11:23 AM

## 2014-04-10 NOTE — Progress Notes (Signed)
Physical Therapy Session Note  Patient Details  Name: Erin [name] MRN: [id number] Date of Birth: [birth year]  Today's Date: 04/10/2014 PT Individual Time: 1000-1100 PT Individual Time Calculation (min): 60 min   Short Term Goals: Week 2:  PT Short Term Goal 1 (Week 2): Pt will perform bilat rolling with bed rails and Mod A of single therapist. PT Short Term Goal 2 (Week 2): Pt will perform supine<>sit with Mod A of single therapist. PT Short Term Goal 3 (Week 2): Pt will transfer from bed<>w/c with max A of single therapist. PT Short Term Goal 4 (Week 2): Pt will perform static sitting balance x60 seconds with S of single therapist.  Skilled Therapeutic Interventions/Progress Updates:   Pt received sitting in w/c, agreeable to therapy session.  Skilled session focused on functional transfers w/c>mat, w/c<>toilet, sit<>stand, and gait with +2 assist.  Assisted pt to/from therapy gym in tilt in space w/c at total A level.  Transferred to mat via squat pivot transfer at max A level with continued cues for hand placement and forward weight shift.  She continues to want to extend trunk when attempting to transfer.  Once on mat, worked on upright posture with facilitation at hips at chest for anterior pelvic tilt and thoracic extension.  She is able to correct, however unable to maintain longer than 15 secs.  Performed standing with max A of single therapist for 2 mins with cues for upright posture and increased glute/quad contraction on LLE.  Also worked on weight shifts R and L in standing at max A with cues for L knee ext.  Progressed to working on gait x 2 reps of 10' with +2 assist (pt assist 30%) with PT under L axilla to provide increased weight shift to the R, upright posture, assist at L knee to prevent buckle and assist with forward translation over LLE while rehab tech provided HHA on R for UE support during gait.  Pt with good clearance of LLE when she shifted adequately to the R.  She did  require some assist for appropriate placement of L foot.  Pt states she thinks she voided during standing therefore assisted back to w/c and to room.  Transferred to toilet in restroom at +2 assist (pt assist 30-40%).  Note better able to assist to the R due to ability to use R grab bar.  Pt not able to void anymore, but provided total A for managing clothing, changing brief and peri care.  Assisted back to w/c and left in w/c in room with all needs in reach.    Therapy Documentation Precautions:  Precautions Precautions: Fall Precaution Comments: subluxation of LT UE noted; per family and patient, muscle wasting at LUE d/t prior injury (fall w/fracture) Restrictions Weight Bearing Restrictions: Yes LLE Weight Bearing: Weight bearing as tolerated   Pain: Pt with pain in groin, allowed several rest breaks during session with improvement.   Locomotion : Ambulation Ambulation/Gait Assistance: 1: +2 Total assist   See FIM for current functional status  Therapy/Group: Individual Therapy  Denice Bors 04/10/2014, 10:55 AM

## 2014-04-10 NOTE — Progress Notes (Signed)
80 y.o. right-handed female with history of question Parkinson's disease followed by Dr. Jannifer Franklin, frequent falls, atrial fibrillation on no anti-coagulation except aspirin due to frequent falls. Patient lives with family use and occasional cane walker when needed and was still driving. Presented 03/20/2014 after mechanical fall when she lost her balance landing on her left hip without loss of consciousness and was admitted to an [facility]. X-rays and imaging revealed a left intertrochanteric hip fracture. Patient noted to be confused with some left-sided weakness. Cranial CT scan showed right parietal lobe distribution of the right middle cerebral artery consistent with acute to subacute ischemia. CTA head and neck consistent with acute infarct right basal ganglia right parietal white matter. High-grade stenosis versus occlusion of distal right M1 segment. Neurology consulted. Patient did not receive TPA. Echocardiogram with ejection fraction of 60% no wall motion abnormalities. Venous Doppler studies lower extremities negative for DVT.  Subjective/Complaints: Total A dressing with OT today  Review of Systems - cannot obtain secondary to communication deficits from CVA  Objective: Vital Signs: Blood pressure 133/72, pulse 76, temperature 98.2 F (36.8 C), temperature source Oral, resp. rate 18, weight 78.7 kg (173 lb 8 oz), SpO2 98.00%. No results found. No results found for this or any previous visit (from the past 72 hour(s)).    Physical Exam  Eyes:  Pupils reactive to light  Neck: Neck supple. No thyromegaly present.  Cardiovascular:  Cardiac rate controlled  Respiratory: Breath sounds normal. No respiratory distress.  GI: Soft. Bowel sounds are normal. She exhibits no distension.  Neurological:  Speech is dysarthric. Left central 7 and tongue deviation. Pleasantly confused but was able to provide her name age. She can provide the hospital subtle cues. Followed simple commands. Limited  awareness of her deficits. She does have a right gaze preference. LUE is tr to 2/5 deltoid, bicep, tricep 1/5 Grip. LLE limited by pain. 1+ HF, 1 KE and ADF.APF. No withdrawal to pinch in Left hand Normal strength on the right side  Skin:  Her hip incision is not tender, no ecchymosis lateral thigh, no drainage under foam dressing   Assessment/Plan: 1. Functional deficits secondary to left hemiparesis from right CVA as well as left intertrochanteric hip fracture status post ORIF 03/25/2014 which require 3+ hours per day of interdisciplinary therapy in a comprehensive inpatient rehab setting. Physiatrist is providing close team supervision and 24 hour management of active medical problems listed below. Physiatrist and rehab team continue to assess barriers to discharge/monitor patient progress toward functional and medical goals. Team conference today please see physician documentation under team conference tab, met with team face-to-face to discuss problems,progress, and goals. Formulized individual treatment plan based on medical history, underlying problem and comorbidities. FIM: FIM - Bathing Bathing Steps Patient Completed: Chest;Left Arm;Abdomen;Right upper leg Bathing: 2: Max-Patient completes 3-4 [id number] parts or 25-49%  FIM - Upper Body Dressing/Undressing Upper body dressing/undressing steps patient completed: Thread/unthread right sleeve of front closure shirt/dress Upper body dressing/undressing: 2: Max-Patient completed 25-49% of tasks FIM - Lower Body Dressing/Undressing Lower body dressing/undressing: 1: Total-Patient completed less than 25% of tasks  FIM - TMusicianDevices: Grab bar or rail for support Toileting: 1: Two helpers  FIM - TRadio producerDevices: Bedside commode (with use of stedy) Toilet Transfers: 1-Two helpers  FIM - BControl and instrumentation engineerDevices: Sliding board;Arm rests Bed/Chair  Transfer: 2: Sit > Supine: Max A (lifting assist/Pt. 25-49%);1: Chair or W/C > Bed: Total A (helper does  all/Pt. < 25%);1: Two helpers  FIM - Locomotion: Wheelchair Distance: 20 Locomotion: Wheelchair: 0: Activity did not occur FIM - Locomotion: Ambulation Locomotion: Ambulation Assistive Devices: Other (comment) (three muskateer style) Ambulation/Gait Assistance: 1: +2 Total assist Locomotion: Ambulation: 0: Activity did not occur  Comprehension Comprehension Mode: Auditory Comprehension: 5-Understands basic 90% of the time/requires cueing < 10% of the time  Expression Expression Mode: Verbal Expression: 4-Expresses basic 75 - 89% of the time/requires cueing 10 - 24% of the time. Needs helper to occlude trach/needs to repeat words.  Social Interaction Social Interaction: 4-Interacts appropriately 75 - 89% of the time - Needs redirection for appropriate language or to initiate interaction.  Problem Solving Problem Solving Mode: Asleep Problem Solving: 3-Solves basic 50 - 74% of the time/requires cueing 25 - 49% of the time  Memory Memory Mode: Asleep Memory: 3-Recognizes or recalls 50 - 74% of the time/requires cueing 25 - 49% of the time Medical Problem List and Plan:  1. Functional deficits secondary to an embolic right parietal basal ganglia infarcts as well as left intertrochanteric hip fracture. Status post ORIF 03/25/2014. Weightbearing as tolerated , poor awareness of def, appreciate Ortho f/u 2. DVT Prophylaxis/Anticoagulation: Subcutaneous heparin. Monitor platelet counts any signs of bleeding. Venous Doppler studies negative  3. Pain Management: Oxycodone as needed. Monitor with increased mobility  4. Dysphagia. Dysphagia 1 honey thick liquids. Monitor hydration. Followup speech therapy  5. Neuropsych: This patient is not capable of making decisions on her own behalf.  6. Skin/Wound Care: Routine skin checks  7. Hypertension/atrial fibrillation. Lopressor 25 mg 3 times a  day. Cardiac rate control, Started on Eliquis per neuro Dr Erlinda Hong, no sign of bleeding 8. Hyperlipidemia. Lipitor  9. GERD. Protonix 10.  UTI , sens Keflex, finish 5 d course   LOS (Days) 13 A FACE TO FACE EVALUATION WAS PERFORMED  KIRSTEINS,ANDREW E 04/10/2014, 9:07 AM

## 2014-04-10 NOTE — Progress Notes (Signed)
Speech Language Pathology Daily Session Note  Patient Details  Name: Erin [name] MRN: [id number] Date of Birth: 1930/01/28  Today's Date: 04/10/2014 SLP Co-Treatment Time: 1300 (co-tx with PT 1300-1400)-1330 SLP Co-Treatment Time Calculation (min): 30 min  Short Term Goals: Week 2: SLP Short Term Goal 1 (Week 2): Pt will demonstrate effectient mastication of Dys 2 texture trilas with Mod assist  SLP Short Term Goal 2 (Week 2): Pt will complete oral motor and pharyngeal strengthening exercises with min assist.  SLP Short Term Goal 3 (Week 2): Pt will sustain attention to functional task for 10-12 minutes with Min assist   SLP Short Term Goal 4 (Week 2): Pt will use external aids to facilitate improved recall of daily information with Mod assist SLP Short Term Goal 5 (Week 2): Pt will scan to left during funcitonal tasks with Mod assist  SLP Short Term Goal 6 (Week 2): Pt will label 2 physicl and 1 cognitive deficits post CVA with Mod question cues  Skilled Therapeutic Interventions: Skilled co-treatment session with PT focused on addressing proper positioning of head neck and truck for optimal PO intake with advanced textures.  Patient positioned in standing frame by PT with table top and mirror in front of her.  SLP facilitated session with skilled observation of advanced Dys 2 textures and honey-thick liquids via cup sips.  Patient required Mod multimodal cues to maintain upright head posture and utilize mirror to manage left-sided anterior loss of solid textures.  Patient also required Min cues to check for pocketing with no overt s/s observed.  Given patient's progress with strength and endurance in PT recommend repeat objective assessment to determine readiness for liquid advancement given previously identified sensory deficits.   FIM:  Comprehension Comprehension Mode: Auditory Comprehension: 5-Understands basic 90% of the time/requires cueing < 10% of the time Expression Expression  Mode: Verbal Expression: 4-Expresses basic 75 - 89% of the time/requires cueing 10 - 24% of the time. Needs helper to occlude trach/needs to repeat words. Social Interaction Social Interaction: 4-Interacts appropriately 75 - 89% of the time - Needs redirection for appropriate language or to initiate interaction. Problem Solving Problem Solving: 3-Solves basic 50 - 74% of the time/requires cueing 25 - 49% of the time Memory Memory: 3-Recognizes or recalls 50 - 74% of the time/requires cueing 25 - 49% of the time FIM - Eating Eating Activity: 4: Helper checks for pocketed food;5: Needs verbal cues/supervision  Pain Pain Assessment Pain Assessment: No/denies pain  Therapy/Group: Individual Therapy  Carmelia Roller., [phone]  [place] 04/10/2014, 5:31 PM

## 2014-04-10 NOTE — Patient Care Conference (Signed)
Inpatient RehabilitationTeam Conference and Plan of Care Update Date: 04/10/2014   Time: 11;30 AM    Patient Name: Erin [name] [facility]      Medical Record Number: [id number]  Date of Birth: Nov 07, 1929 Sex: Female         Room/Bed: 4W14C/4W14C-01 Payor Info: Payor: [employer] MEDICARE / Plan: [employer] MEDICARE CHOICE PPO / Product Type: *No Product type* /    Admitting Diagnosis: r parietal bg infarct l it hip fx  Admit Date/Time:  03/28/2014  7:06 PM Admission Comments: No comment available   Primary Diagnosis:  <principal problem not specified> Principal Problem: <principal problem not specified>  Patient Active Problem List   Diagnosis Date Noted  . CVA (cerebral infarction) 03/28/2014  . Cerebral embolism with cerebral infarction 03/23/2014  . Preoperative cardiovascular examination 03/21/2014  . Fracture, intertrochanteric, left femur 03/20/2014  . Fall at home 03/20/2014  . Hyperlipidemia with target LDL less than 100 02/16/2014  . Need for vaccination with 13-polyvalent pneumococcal conjugate vaccine 02/14/2014  . Acute cystitis 02/14/2014  . Allergic rhinitis 10/15/2013  . Insomnia 10/15/2013  . Polyneuropathy in other diseases classified elsewhere 10/03/2013  . Forgetfulness 08/05/2013  . Swelling of both ankles 06/25/2013  . Leg swelling 06/25/2013  . Acute pancreatitis 06/17/2013  . Chronic atrial fibrillation 04/09/2013  . Atypical chest pain 04/03/2013  . DNR (do not resuscitate) discussion 02/13/2013  . DNR (do not resuscitate) 02/13/2013  . Hearing loss 02/13/2013  . Muscle weakness (generalized) 11/24/2012  . Left shoulder pain 11/13/2012  . Unspecified constipation 09/05/2012  . H/O falling 08/01/2012  . Routine general medical examination at a health care facility 04/16/2012  . FH: colon cancer 02/23/2012  . Memory loss 11/08/2011  . Other general symptoms 11/08/2011  . Other vitamin B12 deficiency anemia 11/08/2011  . Essential and other specified forms of tremor  11/08/2011  . Unsteady gait 10/14/2011  . Osteoporosis, senile   . Prediabetes 03/16/2011  . BURSITIS, RIGHT KNEE 01/05/2010  . SPINAL STENOSIS, LUMBAR 11/20/2009  . SCIATICA 11/13/2009  . OSTEOARTHRITIS, KNEE, RIGHT 09/11/2009  . UNSTEADY GAIT 09/11/2009  . MEDIAL MENISCUS TEAR, RIGHT 09/11/2009  . Essential hypertension, benign 01/02/2009  . GERD 01/02/2009  . HEMORRHAGE OF RECTUM AND ANUS 01/02/2009  . PAIN IN JOINT, HAND 11/20/2008  . Pain in joint, pelvic region and thigh 11/20/2008  . KNEE PAIN 11/20/2008  . ARTHRITIS, LEFT FOOT 07/17/2007    Expected Discharge Date: Expected Discharge Date: 04/18/14  Team Members Present: Physician leading conference: Dr. Alysia Penna Social Worker Present: Ovidio Kin, LCSW Nurse Present: Heather Roberts, RN PT Present: Raylene Everts, PT;Samvel Zinn Varner, PT;Emily Rinaldo Cloud, PT OT Present: Clyda Greener, Rhetta Mura, OT SLP Present: Gunnar Fusi, SLP PPS Coordinator present : Daiva Nakayama, RN, CRRN     Current Status/Progress Goal Weekly Team Focus  Medical   poor po fluid intake, caloric intake fair  improve po fluid intake  MBS   Bowel/Bladder   Incontinent of bowel and bladder  LBM 9/29  Mod assist  Monitor intake and output; attempt timed toileting at meals and bedtime   Swallow/Nutrition/ Hydration   Dys 1 textures and honey-thick liquids with Mod assist   least restrictive PO intake Supervision   objective reassessment    ADL's   Max A - Total A ( + 2 for safety) scoot pivot bed <> w/c, Min A grooming, bathing max A, UB dressing Max A, LB dressing - total A  supervision UB dressing, eating, grooming; min A LB dressing,  bathing, toilet transfer  ADL retraining, L UE neuro reed, functional transfers, postural control, balance sitting EOB and standing, pt/family education   Mobility   Pt is currently max A for bed mobility, min to mod for static sitting balance, max A (+2 for safety) with slideboard transfers, +2 for squat pivot  transfers wthout board.  +2 for standing activities  mod A w/c level  sitting balance, functional transfers, sit<>stand, NMR for LUE/LE, activity tolerance, pt/family education.    Communication   Min-Mod assist   Supervision assist   continue to increase upright posture and vocal intensity   Safety/Cognition/ Behavioral Observations  Mod assist   Min assist   increase left attention, awareness and basic problem solving    Pain   controlled with prn oxycodone  < 3  Assess and treat for pain q shift and prn   Skin   Incision to left hip- foam dressing in place; blister to left heel- foam in place; redness to sacrum- foam dressing in place  Remain free from further breakdown or infection with mod assist  Assess skin q shift and prn      *See Care Plan and progress notes for long and short-term goals.  Barriers to Discharge: dysphagia    Possible Resolutions to Barriers:  SNF    Discharge Planning/Teaching Needs:  Family on board with NH option, looking at area facilities.  Still very optimistic regarding progress and getting home      Team Discussion:  Making progress, but not enough to be able to return home form here.  Sensation absent on her left side.  MBS tomorrow-Dys 1 honey thick currently.  Timed tolieting schedule.    Revisions to Treatment Plan:  Medically ready for NH search   Continued Need for Acute Rehabilitation Level of Care: The patient requires daily medical management by a physician with specialized training in physical medicine and rehabilitation for the following conditions: Daily direction of a multidisciplinary physical rehabilitation program to ensure safe treatment while eliciting the highest outcome that is of practical value to the patient.: Yes Daily analysis of laboratory values and/or radiology reports with any subsequent need for medication adjustment of medical intervention for : Neurological problems;Post surgical problems  Morayo Leven, Gardiner Rhyme 04/10/2014, 1:46 PM

## 2014-04-11 ENCOUNTER — Inpatient Hospital Stay (HOSPITAL_COMMUNITY): Payer: Medicare Other | Admitting: Physical Therapy

## 2014-04-11 ENCOUNTER — Inpatient Hospital Stay (HOSPITAL_COMMUNITY): Payer: Medicare Other | Admitting: Speech Pathology

## 2014-04-11 ENCOUNTER — Inpatient Hospital Stay (HOSPITAL_COMMUNITY): Payer: Medicare PPO

## 2014-04-11 ENCOUNTER — Inpatient Hospital Stay (HOSPITAL_COMMUNITY): Payer: Medicare Other

## 2014-04-11 ENCOUNTER — Inpatient Hospital Stay (HOSPITAL_COMMUNITY): Payer: Medicare Other | Admitting: Occupational Therapy

## 2014-04-11 DIAGNOSIS — S72142S Displaced intertrochanteric fracture of left femur, sequela: Secondary | ICD-10-CM

## 2014-04-11 DIAGNOSIS — I6340 Cerebral infarction due to embolism of unspecified cerebral artery: Secondary | ICD-10-CM

## 2014-04-11 DIAGNOSIS — N3000 Acute cystitis without hematuria: Secondary | ICD-10-CM

## 2014-04-11 DIAGNOSIS — I482 Chronic atrial fibrillation: Secondary | ICD-10-CM

## 2014-04-11 DIAGNOSIS — I10 Essential (primary) hypertension: Secondary | ICD-10-CM

## 2014-04-11 LAB — URINALYSIS, ROUTINE W REFLEX MICROSCOPIC
Bilirubin Urine: NEGATIVE
Glucose, UA: NEGATIVE mg/dL
HGB URINE DIPSTICK: NEGATIVE
Ketones, ur: NEGATIVE mg/dL
NITRITE: NEGATIVE
Protein, ur: NEGATIVE mg/dL
Specific Gravity, Urine: 1.006 (ref 1.005–1.030)
UROBILINOGEN UA: 0.2 mg/dL (ref 0.0–1.0)
pH: 7.5 (ref 5.0–8.0)

## 2014-04-11 LAB — URINE MICROSCOPIC-ADD ON

## 2014-04-11 NOTE — Procedures (Signed)
Objective Swallowing Evaluation: Modified Barium Swallowing Study  Patient Details  Name: Erin [name] MRN: [id number] Date of Birth: 1930-06-10  Today's Date: 04/11/2014 Time: [id number]    Past Medical History:  Past Medical History  Diagnosis Date  . Anxiety   . Arthritis   . Hyperlipidemia   . Essential hypertension, benign   . Osteoporosis   . GERD (gastroesophageal reflux disease)   . Hearing loss   . Restless leg   . Tremor   . Leg edema   . Obese   . Gall bladder disease   . Cataract   . Carpal tunnel syndrome of left wrist   . Atrial fibrillation   . Sleep apnea   . Esophageal dilatation   . Frequent falls   . Parkinson disease   . History of nuclear stress test 08/31/2012    Lexiscan cardiolite negative for ischemia  . Polyneuropathy in other diseases classified elsewhere 10/03/2013   Past Surgical History:  Past Surgical History  Procedure Laterality Date  . Appendectomy    . Cholecystectomy    . Abdominal hysterectomy    . Fracture surgery      Left arm x4  . Benign cyst removed from kidney and lung, bilateral mastectomy    . Bilateral great toenail removal    . Knee arthroscopy  2012    Right knee, Dr Aline Brochure  . Left forearm    . Breast surgery      Bilateral 2000  . Tonsillectomy    . R hand surgery    . Bronchoscopy  02/15/2000  . Right vats.  "  . Right upper lobe wedge resection.  "  . Upper gastrointestinal endoscopy  11/25/1999    Esophagitis/ Normal proximal esophagus, stomach and duodenum  . Colonoscopy  01/17/2009    Dr. Deatra Ina : Internal hemorrhoids/Diverticula, scattered in the ascending colon/  Moderate diverticulosis ascending colon to sigmoid colon  . Esophagogastroduodenoscopy   04/23/2003    Dr. Deatra Ina: HIATAL HERNIA  . Colonoscopy  01/16/2001    Normal  . Cataract extraction, bilateral    . Esophagogastroduodenoscopy  03/20/2012    DGL:OVFIEPPI ring-LIKELY CAUSING MILD DYSPHAGIA/SMALL hiatal hernia/Multiple sessile  polyps ranging between 3-17mm , path benign  . Cataract extraction    . Carpal tunnel release      Left wrist  . Transthoracic echocardiogram  06/24/2009    EF=>55%, mild assymetric LVH; LA mildly dilated; mild mitral annular calcif, borderline MVP, mild-mod MR; mild-mod TR, RV systolic pressure elevated, mild pulm HTN; AV mildly sclerotic; mild pulm valve regurg - ordered r/t bradycardia   . Endovenous ablation saphenous vein w/ laser  01/2011    Right GSV  . Cholecystectomy    . Intramedullary (im) nail intertrochanteric Left 03/25/2014    Procedure: INTRAMEDULLARY NAIL INTERTROCHANTRIC LEFT HIP;  Surgeon: Marianna Payment, MD;  Location: [place];  Service: Orthopedics;  Laterality: Left;   HPI:  80 yo female h/o freq falls, cafib, parkinsons who came in after mechanical fall while in her kitchen, sustaining a displaced intertrochanteric fracture of the left hip.She lives with her son. She has atrial fibrillation without anticoagulation secondary to frequent falls and risk of bleeding. On 03/22/2014 RN noted that the pt was unable to swallow medications and a Code Stroke was initiated. She was later noted to have slurred speech, left facial droop, and an inability to move her left arm, and a right gaze preference. CT shows acute infarct in the right basal ganglia and right  parietal white matter.  Repeat MBS ordered to determine readiness for diet advancement.      Recommendation/Prognosis  Clinical Impression:   Dysphagia Diagnosis: Moderate oral phase dysphagia;Moderate pharyngeal phase dysphagia Pt presents with a moderate oropharyngeal dysphagia.  Deficits are both sensory and motor in nature. Oral phase deficits are characterized by anterior spillage with impaired anterior to posterior transit and lingual manipulation with all consistencies. Abovementioned impairments, in addition to decreased pharyngeal sensation resulted in premature spillage of materials into the pharynx with swallow  response triggered at the level of the vallecula.  Delayed swallow initiation resulted in silent penetration of both nectar and honey thick liquids.  No aspiration or penetration visualized with purees or solid consistencies.  Pt also presents with significantly improved arousal and ability to maintain upright posture.  Given that pt's airway protection is the same regardless of whether liquids were nectar or honey thick and pt with decreased intake of honey thick liquids, SLP recommends diet upgrade to nectar thick liquids to facilitate improved hydration with continued dys 1 solids and full supervision.  Prognosis for advancement of solids at bedside good with continued improvements of alertness and endurance; however, pt will need a repeat MBS in 1-2 weeks to objectively determine readiness for liquids advancement due to silent penetration.     Swallow Evaluation Recommendations:  Diet Recommendations: Dysphagia 1 (Puree);Nectar-thick liquid Liquid Administration via: Cup Medication Administration: Crushed with puree Supervision: Full supervision/cueing for compensatory strategies;Staff to assist with self feeding Compensations: Check for anterior loss;Multiple dry swallows after each bite/sip;Slow rate;Small sips/bites Postural Changes and/or Swallow Maneuvers: Seated upright 90 degrees;Upright 30-60 min after meal Oral Care Recommendations: Oral care BID    Prognosis:  Prognosis for Safe Diet Advancement: Fair Barriers to Reach Goals: Cognitive deficits;Severity of dysphagia   Individuals Consulted: Consulted and Agree with Results and Recommendations: Patient      SLP Assessment/Plan  Plan:   See plan of care   Short Term Goals: Week 2: SLP Short Term Goal 1 (Week 2): Pt will demonstrate effectient mastication of Dys 2 texture trilas with Mod assist  SLP Short Term Goal 2 (Week 2): Pt will complete oral motor and pharyngeal strengthening exercises with min assist.  SLP Short Term  Goal 3 (Week 2): Pt will sustain attention to functional task for 10-12 minutes with Min assist   SLP Short Term Goal 4 (Week 2): Pt will use external aids to facilitate improved recall of daily information with Mod assist SLP Short Term Goal 5 (Week 2): Pt will scan to left during funcitonal tasks with Mod assist  SLP Short Term Goal 6 (Week 2): Pt will label 2 physicl and 1 cognitive deficits post CVA with Mod question cues    General: Date of Onset: 03/20/14 Type of Study: Modified Barium Swallowing Study Reason for Referral: Objectively evaluate swallowing function Previous Swallow Assessment: 9/18 BSE Diet Prior to this Study: Dysphagia 1 (puree);Honey-thick liquids Temperature Spikes Noted: No Respiratory Status: Room air History of Recent Intubation: No Behavior/Cognition: Alert;Requires cueing;Cooperative Oral Cavity - Dentition: Dentures, top;Dentures, bottom Oral Motor / Sensory Function: Impaired - see Bedside swallow eval Self-Feeding Abilities: Able to feed self Patient Positioning: Upright in chair Baseline Vocal Quality: Clear Volitional Cough: Strong Volitional Swallow: Able to elicit Anatomy: Within functional limits Pharyngeal Secretions: Not observed secondary MBS   Reason for Referral:   Objectively evaluate swallowing function    Oral Phase: Oral Preparation/Oral Phase Oral Phase: Impaired Oral - Solids Oral - Puree: Weak lingual manipulation;Incomplete  tongue to palate contact;Reduced posterior propulsion;Lingual/palatal residue;Delayed oral transit;Right anterior bolus loss Oral - Mechanical Soft: Weak lingual manipulation;Lingual/palatal residue;Incomplete tongue to palate contact;Delayed oral transit;Impaired mastication;Reduced posterior propulsion   Pharyngeal Phase:  Pharyngeal Phase Pharyngeal Phase: Impaired Pharyngeal - Honey Pharyngeal - Honey Cup: Delayed swallow initiation;Premature spillage to valleculae;Reduced epiglottic inversion;Reduced  laryngeal elevation;Reduced tongue base retraction;Penetration/Aspiration during swallow;Compensatory strategies attempted (Comment) (cuing for throat clear and extra swallows ) Pharyngeal - Nectar Pharyngeal - Nectar Cup: Delayed swallow initiation;Premature spillage to valleculae;Reduced airway/laryngeal closure;Penetration/Aspiration during swallow;Trace aspiration;Compensatory strategies attempted (Comment);Reduced epiglottic inversion;Reduced laryngeal elevation;Reduced tongue base retraction (cuing for throat clear and extra swallows) Penetration/Aspiration details (nectar cup): Material enters airway, remains ABOVE vocal cords and not ejected out Pharyngeal - Solids Pharyngeal - Puree: Delayed swallow initiation;Premature spillage to valleculae;Reduced pharyngeal peristalsis;Other (Comment);Reduced epiglottic inversion;Reduced laryngeal elevation;Reduced tongue base retraction Pharyngeal - Mechanical Soft: Other (Comment);Delayed swallow initiation;Premature spillage to valleculae;Reduced pharyngeal peristalsis;Reduced epiglottic inversion;Reduced laryngeal elevation;Reduced tongue base retraction        GN          Aretta Stetzel, Selinda Orion 04/11/2014, 8:02 PM

## 2014-04-11 NOTE — Progress Notes (Signed)
80 y.o. right-handed female with history of question Parkinson's disease followed by Dr. Jannifer Franklin, frequent falls, atrial fibrillation on no anti-coagulation except aspirin due to frequent falls. Patient lives with family use and occasional cane walker when needed and was still driving. Presented 03/20/2014 after mechanical fall when she lost her balance landing on her left hip without loss of consciousness and was admitted to an [facility] X-rays and imaging revealed a left intertrochanteric hip fracture. Patient noted to be confused with some left-sided weakness. Cranial CT scan showed right parietal lobe distribution of the right middle cerebral artery consistent with acute to subacute ischemia. CTA head and neck consistent with acute infarct right basal ganglia right parietal white matter. High-grade stenosis versus occlusion of distal right M1 segment. Neurology consulted. Patient did not receive TPA. Echocardiogram with ejection fraction of 60% no wall motion abnormalities. Venous Doppler studies lower extremities negative for DVT.  Subjective/Complaints: Concerned about her incontinence and inconvenience to staff  Review of Systems - cannot obtain secondary to communication deficits from CVA  Objective: Vital Signs: Blood pressure 124/77, pulse 82, temperature 98.6 F (37 C), temperature source Oral, resp. rate 18, weight 78.7 kg (173 lb 8 oz), SpO2 97.00%. No results found. No results found for this or any previous visit (from the past 72 hour(s)).    Physical Exam  obese Eyes:  Pupils reactive to light  Neck: Neck supple. No thyromegaly present.  Cardiovascular:  Cardiac rate controlled  Respiratory: Breath sounds normal. No respiratory distress.  GI: Soft. Bowel sounds are normal. She exhibits no distension.  Neurological:  Speech is dysarthric. Left central 7 and tongue deviation. Pleasantly confused but was able to provide her name age. She can provide the hospital subtle cues. Followed  simple commands. Limited awareness of her deficits. She does have a right gaze preference. LUE is tr to 2/5 deltoid, bicep, tricep 1/5 Grip. LLE limited by pain. 1+ HF, 1 KE and ADF.APF. No withdrawal to pinch in Left hand Normal strength on the right side  Skin:  Her hip incision is not tender, no ecchymosis lateral thigh, no drainage under foam dressing   Assessment/Plan: 1. Functional deficits secondary to left hemiparesis from right CVA as well as left intertrochanteric hip fracture status post ORIF 03/25/2014 which require 3+ hours per day of interdisciplinary therapy in a comprehensive inpatient rehab setting. Physiatrist is providing close team supervision and 24 hour management of active medical problems listed below. Physiatrist and rehab team continue to assess barriers to discharge/monitor patient progress toward functional and medical goals. Team conference today please see physician documentation under team conference tab, met with team face-to-face to discuss problems,progress, and goals. Formulized individual treatment plan based on medical history, underlying problem and comorbidities. FIM: FIM - Bathing Bathing Steps Patient Completed: Chest;Left Arm;Abdomen;Right upper leg Bathing: 2: Max-Patient completes 3-4 [id number] parts or 25-49%  FIM - Upper Body Dressing/Undressing Upper body dressing/undressing steps patient completed: Thread/unthread right sleeve of front closure shirt/dress Upper body dressing/undressing: 2: Max-Patient completed 25-49% of tasks FIM - Lower Body Dressing/Undressing Lower body dressing/undressing: 1: Total-Patient completed less than 25% of tasks  FIM - TMusicianDevices: Grab bar or rail for support Toileting: 0: Activity did not occur  FIM - TRadio producerDevices: Bedside commode (with use of stedy) Toilet Transfers: 0-Activity did not occur  FIM - BControl and instrumentation engineer Devices: Arm rests Bed/Chair Transfer: 1: Chair or W/C > Bed: Total A (helper does all/Pt. <  25%)  FIM - Locomotion: Wheelchair Distance: 20 Locomotion: Wheelchair: 0: Activity did not occur FIM - Locomotion: Ambulation Locomotion: Ambulation Assistive Devices:  (two person A, one under axilla and one HHA) Ambulation/Gait Assistance: 1: +2 Total assist Locomotion: Ambulation: 1: Two helpers  Comprehension Comprehension Mode: Auditory Comprehension: 5-Understands basic 90% of the time/requires cueing < 10% of the time  Expression Expression Mode: Verbal Expression: 4-Expresses basic 75 - 89% of the time/requires cueing 10 - 24% of the time. Needs helper to occlude trach/needs to repeat words.  Social Interaction Social Interaction: 4-Interacts appropriately 75 - 89% of the time - Needs redirection for appropriate language or to initiate interaction.  Problem Solving Problem Solving Mode: Asleep Problem Solving: 3-Solves basic 50 - 74% of the time/requires cueing 25 - 49% of the time  Memory Memory Mode: Asleep Memory: 3-Recognizes or recalls 50 - 74% of the time/requires cueing 25 - 49% of the time Medical Problem List and Plan:  1. Functional deficits secondary to an embolic right parietal basal ganglia infarcts as well as left intertrochanteric hip fracture. Status post ORIF 03/25/2014. Weightbearing as tolerated , poor awareness of def, appreciate Ortho f/u 2. DVT Prophylaxis/Anticoagulation: Subcutaneous heparin. Monitor platelet counts any signs of bleeding. Venous Doppler studies negative  3. Pain Management: Oxycodone as needed. Monitor with increased mobility  4. Dysphagia. Dysphagia 1 honey thick liquids.  . Followup speech therapy. Recheck labs tomorrow 5. Neuropsych: This patient is not capable of making decisions on her own behalf.  6. Skin/Wound Care: Routine skin checks  7. Hypertension/atrial fibrillation. Lopressor 25 mg 3 times a day. Cardiac rate control, Started  on Eliquis per neuro Dr Erlinda Hong, no sign of bleeding 8. Hyperlipidemia. Lipitor  9. GERD. Protonix 10.  Bladder: re-check ucx, recently finished abx for uti   LOS (Days) 14 A FACE TO FACE EVALUATION WAS PERFORMED  Arnie Maiolo T 04/11/2014, 10:34 AM

## 2014-04-11 NOTE — Progress Notes (Signed)
Social Work Patient ID: Erin [name], female   DOB: 1929-07-19, 80 y.o.   MRN: [id number] Spoke with Dale-son  And pt to update regarding team conference progression toward goals and that she would be ready to go to NH next week, according to MD. He is aware pt having another MBS today-awaiting results.  Pt and family would prefer [facility] if they will take her.  Will send FL2 once swallow study completed and see Can advance liquids.  Work toward NH bed, family on board.

## 2014-04-11 NOTE — Progress Notes (Signed)
Occupational Therapy Note  Patient Details  Name: Erin [name] MRN: [id number] Date of Birth: [birth year]  Today's Date: 04/11/2014 OT Individual Time: 1130-1200 OT Individual Time Calculation (min): 30 min   Pt denied pain Individual Therapy  Pt engaged in LUE tasks/activities with focus on increased volitional use of LUE (elbow flexion, shoulder flexion, and shoulder adduction). Pt also engaged in bed mobility tasks with max A.  Pt exhibited difficulty keeping eyes open throughout session.   Leotis Shames [facility] 04/11/2014, 2:51 PM

## 2014-04-11 NOTE — Plan of Care (Signed)
Problem: RH SKIN INTEGRITY Goal: RH STG SKIN FREE OF INFECTION/BREAKDOWN Skin will be free of infection and no skin breakdown at discharge .  Outcome: Not Progressing Multiple pressure ulcers to heels and sacrum

## 2014-04-11 NOTE — Progress Notes (Signed)
Occupational Therapy Session Note  Patient Details  Name: Erin [name] MRN: [id number] Date of Birth: 04/10/1930  Today's Date: 04/11/2014 OT Individual Time: 1245-1345 OT Individual Time Calculation (min): 60 min    Short Term Goals: Week 2:  OT Short Term Goal 1 (Week 2): Pt will demonstrate ability to perform SROM at LUE with supervision.  OT Short Term Goal 2 (Week 2): Pt will complete toliet hygiene with mod A of 1 for clothing management. OT Short Term Goal 3 (Week 2): Pt will tolerate standing with max a of 1 for LB dressing.  OT Short Term Goal 4 (Week 2): Pt will perform sliding board transfer from bed <> w/c with max A of 1.   Skilled Therapeutic Interventions/Progress Updates:  Upon entering the room, pt supine in bed with 5/10 groin pain during session. Pt reporting, "I need to be changed" as soon as therapist entered room. Pt rolled > R with min A and L with mod A and verbal cues for move R UE/shoulder forward for bed mobility in order to clean pt from incontinent bowel and bladder occurrence. Once cleaned, pt reports she needs to use the bathroom again. Sit >supine with Max A and squat pivot transfer to [facility] for toileting. Pt continued to bath self while seated on BSC with focus on lateral leans the the R by reaching for ADL items and correcting posture with mod verbal cues during session. Back supported in [facility] and seated balance SBA - Min A during session. Hand over hand assistance to apply deodorant and wash under R armpit. Pt requiring 2 helpers to assist with clothing management and hygiene after toileting. Max A for standing balance with verbal cues and manual facilitation for pt to stand upright and shift weight to the R. Pt unbuttoned shirt this session with R hand in order to remove shirt for ADLs. Pt able to locate and demonstrate dressing of L UE first this session which she has not been able to do previously. Pt with c/o fatgiue and required encouragement for motivation to  continue. Clarise Cruz Plus utilized to assist pt with STS from [facility] into w/c. Pt required 2 person assist secondary to safety and level of assistance. Pt seated in w/c, reclined, with call bell and all other needed items present upon exiting the room.   Therapy Documentation Precautions:  Precautions Precautions: Fall Precaution Comments: subluxation of LT UE noted; per family and patient, muscle wasting at LUE d/t prior injury (fall w/fracture) Restrictions Weight Bearing Restrictions: Yes LLE Weight Bearing: Weight bearing as tolerated Pain: Pain Assessment Pain Assessment: 0-10 Pain Score: 5  Pain Type: Acute pain Pain Orientation: Left Pain Descriptors / Indicators: Aching Pain Onset: Other (Comment) (with sitting) Pain Intervention(s): Repositioned;Ambulation/increased activity Multiple Pain Sites: No ADL: ADL ADL Comments: see FIM  See FIM for current functional status  Therapy/Group: Individual Therapy  Phineas Semen 04/11/2014, 5:13 PM

## 2014-04-11 NOTE — Progress Notes (Signed)
Physical Therapy Session Note  Patient Details  Name: Erin [name] MRN: [id number] Date of Birth: Dec 21, 1929  Today's Date: 04/11/2014 PT Individual Time: 1505-1610 PT Individual Time Calculation (min): 65 min   Short Term Goals: Week 2:  PT Short Term Goal 1 (Week 2): Pt will perform bilat rolling with bed rails and Mod A of single therapist. PT Short Term Goal 2 (Week 2): Pt will perform supine<>sit with Mod A of single therapist. PT Short Term Goal 3 (Week 2): Pt will transfer from bed<>w/c with max A of single therapist. PT Short Term Goal 4 (Week 2): Pt will perform static sitting balance x60 seconds with S of single therapist.  Skilled Therapeutic Interventions/Progress Updates:    2:1. Pt received reclined in tilt-in-space w/c; reporting fatigue but agreeable to therapy. Session focused on gait training, functional transfers. Pt performed lateral scooting transfers from mat table<>w/c>bed with slide board and max A (hand over back technique), multimodal cueing for full anterior weight shift. Upon sitting EOM in treatment gym, pt tearful due to pain in L groin. With encouragement, pt agreeable to attempting to stand, ambulate prior to ending session. Upon standing, pt reported far less pain in groin, was agreeable to finishing session, and declined asking nurse for medication at this time. Performed sit<>stand transfers x2 from elevated mat table with +2A, tactile cueing at L knee for weightbearing. Attempted gait with EVA walker; however, pt with increased pushing to L side when attempting to advance RLE. Transitioned to ambulation x27' without assistive device requiring +2A with rehab tech positioned under RUE and this therapist positioned at L side providing assist, manual facilitation at L ribcage and R posterior pelvis for upright posture. Pt required manual facilitation of lateral weight shifting, manual stabilization of L knee during LLE stance, verbal cueing for increased L step  length, and multimodal cueing for upright posture. Gait trial ended due to pt expressing urgent need to have BM. Pt therefore transported back to room, where pt performed stand pivot transfer from w/c<>toilet with grab bars and +2A, manual stabilization of L knee, manual facilitation of lateral weight shift to R side, and tactile/verbal cueing for LLE advancement. Pt unable to have BM. Pt left semi reclined in air bed 4 rails up, bed alarm on, and all needs within reach.  Therapy Documentation Precautions:  Precautions Precautions: Fall Precaution Comments: subluxation of LT UE noted; per family and patient, muscle wasting at LUE d/t prior injury (fall w/fracture) Restrictions Weight Bearing Restrictions: Yes LLE Weight Bearing: Weight bearing as tolerated Pain: Pain Assessment Pain Assessment: 0-10 Pain Score: 5  Pain Type: Acute pain Pain Location: Groin Pain Orientation: Left Pain Descriptors / Indicators: Aching Pain Onset: Other (Comment) (with sitting) Pain Intervention(s): Repositioned;Ambulation/increased activity Multiple Pain Sites: No Locomotion : Ambulation Ambulation/Gait Assistance: 1: +2 Total assist   See FIM for current functional status  Therapy/Group: Individual Therapy  Hobble, [name] 04/11/2014, 4:34 PM

## 2014-04-12 ENCOUNTER — Inpatient Hospital Stay (HOSPITAL_COMMUNITY): Payer: Medicare Other | Admitting: Speech Pathology

## 2014-04-12 ENCOUNTER — Inpatient Hospital Stay (HOSPITAL_COMMUNITY): Payer: Medicare Other | Admitting: Rehabilitation

## 2014-04-12 ENCOUNTER — Encounter (HOSPITAL_COMMUNITY): Payer: Medicare Other | Admitting: Occupational Therapy

## 2014-04-12 DIAGNOSIS — R413 Other amnesia: Secondary | ICD-10-CM

## 2014-04-12 DIAGNOSIS — R2681 Unsteadiness on feet: Secondary | ICD-10-CM

## 2014-04-12 DIAGNOSIS — M769 Unspecified enthesopathy, lower limb, excluding foot: Secondary | ICD-10-CM

## 2014-04-12 LAB — BASIC METABOLIC PANEL
Anion gap: 12 (ref 5–15)
BUN: 6 mg/dL (ref 6–23)
CO2: 28 mEq/L (ref 19–32)
CREATININE: 0.46 mg/dL — AB (ref 0.50–1.10)
Calcium: 8.4 mg/dL (ref 8.4–10.5)
Chloride: 102 mEq/L (ref 96–112)
GFR calc Af Amer: >90 mL/min (ref >90)
Glucose, Bld: 117 mg/dL — ABNORMAL HIGH (ref 70–99)
POTASSIUM: 3 meq/L — AB (ref 3.7–5.3)
Sodium: 142 mEq/L (ref 137–147)

## 2014-04-12 MED ORDER — LIDOCAINE HCL 2 % EX GEL
1.0000 "application " | Freq: Once | CUTANEOUS | Status: AC
  Administered 2014-04-12: 1 via URETHRAL
  Filled 2014-04-12: qty 5

## 2014-04-12 MED ORDER — CIPROFLOXACIN HCL 250 MG PO TABS
250.0000 mg | ORAL_TABLET | Freq: Two times a day (BID) | ORAL | Status: DC
  Administered 2014-04-12 – 2014-04-17 (×10): 250 mg via ORAL
  Filled 2014-04-12 (×12): qty 1

## 2014-04-12 MED ORDER — POTASSIUM CHLORIDE CRYS ER 20 MEQ PO TBCR
20.0000 meq | EXTENDED_RELEASE_TABLET | Freq: Two times a day (BID) | ORAL | Status: DC
  Filled 2014-04-12 (×3): qty 1

## 2014-04-12 MED ORDER — POTASSIUM CHLORIDE 20 MEQ/15ML (10%) PO LIQD
20.0000 meq | Freq: Two times a day (BID) | ORAL | Status: DC
  Administered 2014-04-12 – 2014-04-17 (×11): 20 meq via ORAL
  Filled 2014-04-12 (×13): qty 15

## 2014-04-12 NOTE — Progress Notes (Signed)
80 y.o. right-handed female with history of question Parkinson's disease followed by Dr. Jannifer Franklin, frequent falls, atrial fibrillation on no anti-coagulation except aspirin due to frequent falls. Patient lives with family use and occasional cane walker when needed and was still driving. Presented 03/20/2014 after mechanical fall when she lost her balance landing on her left hip without loss of consciousness and was admitted to an [facility]. X-rays and imaging revealed a left intertrochanteric hip fracture. Patient noted to be confused with some left-sided weakness. Cranial CT scan showed right parietal lobe distribution of the right middle cerebral artery consistent with acute to subacute ischemia. CTA head and neck consistent with acute infarct right basal ganglia right parietal white matter. High-grade stenosis versus occlusion of distal right M1 segment. Neurology consulted. Patient did not receive TPA. Echocardiogram with ejection fraction of 60% no wall motion abnormalities. Venous Doppler studies lower extremities negative for DVT.  Subjective/Complaints: Pt with some urine retention. Multiple small stools last night  Review of Systems - cannot obtain secondary to communication deficits from CVA  Objective: Vital Signs: Blood pressure 124/62, pulse 86, temperature 98.4 F (36.9 C), temperature source Oral, resp. rate 18, weight 78.7 kg (173 lb 8 oz), SpO2 94.00%. Dg Swallowing Func-speech Pathology  04/11/2014   Selinda Orion Page, CCC-SLP     04/11/2014  8:25 PM   Objective Swallowing Evaluation: Modified Barium Swallowing Study   Patient Details  Name: Erin [name] MRN: [id number] Date of Birth: [birth year]  Today's Date: 04/11/2014 Time: 1751-0258    Past Medical History:  Past Medical History  Diagnosis Date  . Anxiety   . Arthritis   . Hyperlipidemia   . Essential hypertension, benign   . Osteoporosis   . GERD (gastroesophageal reflux disease)   . Hearing loss   . Restless leg   . Tremor   . Leg edema   .  Obese   . Gall bladder disease   . Cataract   . Carpal tunnel syndrome of left wrist   . Atrial fibrillation   . Sleep apnea   . Esophageal dilatation   . Frequent falls   . Parkinson disease   . History of nuclear stress test 08/31/2012    Lexiscan cardiolite negative for ischemia  . Polyneuropathy in other diseases classified elsewhere 10/03/2013    Past Surgical History:  Past Surgical History  Procedure Laterality Date  . Appendectomy    . Cholecystectomy    . Abdominal hysterectomy    . Fracture surgery      Left arm x4  . Benign cyst removed from kidney and lung, bilateral mastectomy     . Bilateral great toenail removal    . Knee arthroscopy  2012    Right knee, Dr Aline Brochure  . Left forearm    . Breast surgery      Bilateral 2000  . Tonsillectomy    . R hand surgery    . Bronchoscopy  02/15/2000  . Right vats.  "  . Right upper lobe wedge resection.  "  . Upper gastrointestinal endoscopy  11/25/1999    Esophagitis/ Normal proximal esophagus, stomach and duodenum  . Colonoscopy  01/17/2009    Dr. Deatra Ina : Internal hemorrhoids/Diverticula, scattered in the  ascending colon/  Moderate diverticulosis ascending colon to  sigmoid colon  . Esophagogastroduodenoscopy   04/23/2003    Dr. Deatra Ina: HIATAL HERNIA  . Colonoscopy  01/16/2001    Normal  . Cataract extraction, bilateral    . Esophagogastroduodenoscopy  03/20/2012  ETR:ENLKDRMF ring-LIKELY CAUSING MILD DYSPHAGIA/SMALL hiatal  hernia/Multiple sessile polyps ranging between 3-62mm , path  benign  . Cataract extraction    . Carpal tunnel release      Left wrist  . Transthoracic echocardiogram  06/24/2009    EF=>55%, mild assymetric LVH; LA mildly dilated; mild mitral  annular calcif, borderline MVP, mild-mod MR; mild-mod TR, RV  systolic pressure elevated, mild pulm HTN; AV mildly sclerotic;  mild pulm valve regurg - ordered r/t bradycardia   . Endovenous ablation saphenous vein w/ laser  01/2011    Right GSV  . Cholecystectomy    . Intramedullary (im) nail  intertrochanteric Left 03/25/2014    Procedure: INTRAMEDULLARY NAIL INTERTROCHANTRIC LEFT HIP;   Surgeon: Cheral Almas, MD;  Location: [facility] OR;  Service:  Orthopedics;  Laterality: Left;   HPI:  80 yo female h/o freq falls, cafib, parkinsons who came in after  mechanical fall while in her kitchen, sustaining a displaced  intertrochanteric fracture of the left hip.She lives with her  son. She has atrial fibrillation without anticoagulation  secondary to frequent falls and risk of bleeding. On 03/22/2014 RN  noted that the pt was unable to swallow medications and a Code  Stroke was initiated. She was later noted to have slurred speech,  left facial droop, and an inability to move her left arm, and a  right gaze preference. CT shows acute infarct in the right basal  ganglia and right parietal white matter.  Repeat MBS ordered to  determine readiness for diet advancement.      Recommendation/Prognosis  Clinical Impression:   Dysphagia Diagnosis: Moderate oral phase dysphagia;Moderate  pharyngeal phase dysphagia Pt presents with a moderate oropharyngeal dysphagia.  Deficits  are both sensory and motor in nature. Oral phase deficits are  characterized by anterior spillage with impaired anterior to  posterior transit and lingual manipulation with all  consistencies. Abovementioned impairments, in addition to  decreased pharyngeal sensation resulted in premature spillage of  materials into the pharynx with swallow response triggered at the  level of the vallecula.  Delayed swallow initiation resulted in  silent penetration of both nectar and honey thick liquids.  No  aspiration or penetration visualized with purees or solid  consistencies.  Pt also presents with significantly improved  arousal and ability to maintain upright posture.  Given that pt's  airway protection is the same regardless of whether liquids were  nectar or honey thick and pt with decreased intake of honey thick  liquids, SLP recommends diet upgrade to  nectar thick liquids to  facilitate improved hydration with continued dys 1 solids and  full supervision.  Prognosis for advancement of solids at bedside  good with continued improvements of alertness and endurance;  however, pt will need a repeat MBS in 1-2 weeks to objectively  determine readiness for liquids advancement due to silent  penetration.     Swallow Evaluation Recommendations:  Diet Recommendations: Dysphagia 1 (Puree);Nectar-thick liquid Liquid Administration via: Cup Medication Administration: Crushed with puree Supervision: Full supervision/cueing for compensatory  strategies;Staff to assist with self feeding Compensations: Check for anterior loss;Multiple dry swallows  after each bite/sip;Slow rate;Small sips/bites Postural Changes and/or Swallow Maneuvers: Seated upright 90  degrees;Upright 30-60 min after meal Oral Care Recommendations: Oral care BID    Prognosis:  Prognosis for Safe Diet Advancement: Fair Barriers to Reach Goals: Cognitive deficits;Severity of dysphagia   Individuals Consulted: Consulted and Agree with Results and  Recommendations: Patient      SLP Assessment/Plan  Plan:   See plan of care   Short Term Goals: Week 2: SLP Short Term Goal 1 (Week 2): Pt will  demonstrate effectient mastication of Dys 2 texture trilas with  Mod assist  SLP Short Term Goal 2 (Week 2): Pt will complete oral motor and  pharyngeal strengthening exercises with min assist.  SLP Short Term Goal 3 (Week 2): Pt will sustain attention to  functional task for 10-12 minutes with Min assist   SLP Short Term Goal 4 (Week 2): Pt will use external aids to  facilitate improved recall of daily information with Mod assist SLP Short Term Goal 5 (Week 2): Pt will scan to left during  funcitonal tasks with Mod assist  SLP Short Term Goal 6 (Week 2): Pt will label 2 physicl and 1  cognitive deficits post CVA with Mod question cues    General: Date of Onset: 03/20/14 Type of Study: Modified Barium Swallowing Study Reason  for Referral: Objectively evaluate swallowing function Previous Swallow Assessment: 9/18 BSE Diet Prior to this Study: Dysphagia 1 (puree);Honey-thick liquids Temperature Spikes Noted: No Respiratory Status: Room air History of Recent Intubation: No Behavior/Cognition: Alert;Requires cueing;Cooperative Oral Cavity - Dentition: Dentures, top;Dentures, bottom Oral Motor / Sensory Function: Impaired - see Bedside swallow  eval Self-Feeding Abilities: Able to feed self Patient Positioning: Upright in chair Baseline Vocal Quality: Clear Volitional Cough: Strong Volitional Swallow: Able to elicit Anatomy: Within functional limits Pharyngeal Secretions: Not observed secondary MBS   Reason for Referral:   Objectively evaluate swallowing function    Oral Phase: Oral Preparation/Oral Phase Oral Phase: Impaired Oral - Solids Oral - Puree: Weak lingual manipulation;Incomplete tongue to  palate contact;Reduced posterior propulsion;Lingual/palatal  residue;Delayed oral transit;Right anterior bolus loss Oral - Mechanical Soft: Weak lingual manipulation;Lingual/palatal  residue;Incomplete tongue to palate contact;Delayed oral  transit;Impaired mastication;Reduced posterior propulsion   Pharyngeal Phase:  Pharyngeal Phase Pharyngeal Phase: Impaired Pharyngeal - Honey Pharyngeal - Honey Cup: Delayed swallow initiation;Premature  spillage to valleculae;Reduced epiglottic inversion;Reduced  laryngeal elevation;Reduced tongue base  retraction;Penetration/Aspiration during swallow;Compensatory  strategies attempted (Comment) (cuing for throat clear and extra  swallows ) Pharyngeal - Nectar Pharyngeal - Nectar Cup: Delayed swallow initiation;Premature  spillage to valleculae;Reduced airway/laryngeal  closure;Penetration/Aspiration during swallow;Trace  aspiration;Compensatory strategies attempted (Comment);Reduced  epiglottic inversion;Reduced laryngeal elevation;Reduced tongue  base retraction (cuing for throat clear and extra swallows)  Penetration/Aspiration details (nectar cup): Material enters  airway, remains ABOVE vocal cords and not ejected out Pharyngeal - Solids Pharyngeal - Puree: Delayed swallow initiation;Premature spillage  to valleculae;Reduced pharyngeal peristalsis;Other  (Comment);Reduced epiglottic inversion;Reduced laryngeal  elevation;Reduced tongue base retraction Pharyngeal - Mechanical Soft: Other (Comment);Delayed swallow  initiation;Premature spillage to valleculae;Reduced pharyngeal  peristalsis;Reduced epiglottic inversion;Reduced laryngeal  elevation;Reduced tongue base retraction        GN          Page, Selinda Orion 04/11/2014, 8:02 PM                    Results for orders placed during the hospital encounter of 03/28/14 (from the past 72 hour(s))  URINALYSIS, ROUTINE W REFLEX MICROSCOPIC     Status: Abnormal   Collection Time    04/11/14 12:23 PM      Result Value Ref Range   Color, Urine YELLOW  YELLOW   APPearance HAZY (*) CLEAR   Specific Gravity, Urine 1.006  1.005 - 1.030   pH 7.5  5.0 - 8.0   Glucose, UA NEGATIVE  NEGATIVE mg/dL   Hgb urine  dipstick NEGATIVE  NEGATIVE   Bilirubin Urine NEGATIVE  NEGATIVE   Ketones, ur NEGATIVE  NEGATIVE mg/dL   Protein, ur NEGATIVE  NEGATIVE mg/dL   Urobilinogen, UA 0.2  0.0 - 1.0 mg/dL   Nitrite NEGATIVE  NEGATIVE   Leukocytes, UA MODERATE (*) NEGATIVE  URINE MICROSCOPIC-ADD ON     Status: Abnormal   Collection Time    04/11/14 12:23 PM      Result Value Ref Range   Squamous Epithelial / LPF RARE  RARE   WBC, UA 21-50  <3 WBC/hpf   Bacteria, UA FEW (*) RARE  BASIC METABOLIC PANEL     Status: Abnormal   Collection Time    04/12/14  5:00 AM      Result Value Ref Range   Sodium 142  137 - 147 mEq/L   Potassium 3.0 (*) 3.7 - 5.3 mEq/L   Chloride 102  96 - 112 mEq/L   CO2 28  19 - 32 mEq/L   Glucose, Bld 117 (*) 70 - 99 mg/dL   BUN 6  6 - 23 mg/dL   Creatinine, Ser 0.46 (*) 0.50 - 1.10 mg/dL   Calcium 8.4  8.4 - 10.5 mg/dL   GFR calc non Af Amer >90   >90 mL/min   GFR calc Af Amer >90  >90 mL/min   Comment: (NOTE)     The eGFR has been calculated using the CKD EPI equation.     This calculation has not been validated in all clinical situations.     eGFR's persistently <90 mL/min signify possible Chronic Kidney     Disease.   Anion gap 12  5 - 15      Physical Exam  obese Eyes:  Pupils reactive to light  Neck: Neck supple. No thyromegaly present.  Cardiovascular:  Cardiac rate controlled  Respiratory: Breath sounds normal. No respiratory distress.  GI: Soft. Bowel sounds are normal. She exhibits no distension.  Neurological:  Speech is dysarthric. Left central 7 and tongue deviation. Pleasantly confused  . Followed simple commands. Limited awareness of her deficits. She does have a right gaze preference. LUE is tr to 2/5 deltoid, bicep, tricep 1/5 Grip. LLE limited by pain. 1+ HF, 1 KE and ADF.APF. No withdrawal to pinch in Left hand Normal strength on the right side  Skin:  Her hip incision is not tender, , no drainage under foam dressing   Assessment/Plan: 1. Functional deficits secondary to left hemiparesis from right CVA as well as left intertrochanteric hip fracture status post ORIF 03/25/2014 which require 3+ hours per day of interdisciplinary therapy in a comprehensive inpatient rehab setting. Physiatrist is providing close team supervision and 24 hour management of active medical problems listed below. Physiatrist and rehab team continue to assess barriers to discharge/monitor patient progress toward functional and medical goals. Team conference today please see physician documentation under team conference tab, met with team face-to-face to discuss problems,progress, and goals. Formulized individual treatment plan based on medical history, underlying problem and comorbidities. FIM: FIM - Bathing Bathing Steps Patient Completed: Chest;Left Arm;Abdomen;Front perineal area Bathing: 2: Max-Patient completes 3-4 69f 10 parts or  25-49%  FIM - Upper Body Dressing/Undressing Upper body dressing/undressing steps patient completed: Thread/unthread left sleeve of front closure shirt/dress;Thread/unthread right sleeve of front closure shirt/dress Upper body dressing/undressing: 3: Mod-Patient completed 50-74% of tasks FIM - Lower Body Dressing/Undressing Lower body dressing/undressing: 1: Two helpers  FIM - Musician Devices: Grab bar or rail for support Toileting: 1:  Two helpers  FIM - Radio producer Devices: Recruitment consultant Transfers: 1-Two helpers  FIM - Control and instrumentation engineer Devices: Arm rests Bed/Chair Transfer: 2: Bed > Chair or W/C: Max A (lift and lower assist)  FIM - Locomotion: Wheelchair Distance: 20 Locomotion: Wheelchair: 0: Activity did not occur FIM - Locomotion: Ambulation Locomotion: Ambulation Assistive Devices: Other (comment) (one person positioned under RUE, other at L side) Ambulation/Gait Assistance: 1: +2 Total assist Locomotion: Ambulation: 1: Two helpers  Comprehension Comprehension Mode: Auditory Comprehension: 5-Understands basic 90% of the time/requires cueing < 10% of the time  Expression Expression Mode: Verbal Expression: 4-Expresses basic 75 - 89% of the time/requires cueing 10 - 24% of the time. Needs helper to occlude trach/needs to repeat words.  Social Interaction Social Interaction: 4-Interacts appropriately 75 - 89% of the time - Needs redirection for appropriate language or to initiate interaction.  Problem Solving Problem Solving Mode: Asleep Problem Solving: 3-Solves basic 50 - 74% of the time/requires cueing 25 - 49% of the time  Memory Memory Mode: Asleep Memory: 3-Recognizes or recalls 50 - 74% of the time/requires cueing 25 - 49% of the time Medical Problem List and Plan:  1. Functional deficits secondary to an embolic right parietal basal ganglia infarcts as well as left  intertrochanteric hip fracture. Status post ORIF 03/25/2014. Weightbearing as tolerated , poor awareness of def, appreciate Ortho f/u 2. DVT Prophylaxis/Anticoagulation: Subcutaneous heparin. Monitor platelet counts any signs of bleeding. Venous Doppler studies negative  3. Pain Management: Oxycodone as needed. Monitor with increased mobility  4. Dysphagia. Dysphagia 1 honey thick liquids.  . Followup speech therapy. Repeat labs ok  -replace potassium 5. Neuropsych: This patient is not capable of making decisions on her own behalf.  6. Skin/Wound Care: Routine skin checks  7. Hypertension/atrial fibrillation. Lopressor 25 mg 3 times a day. Cardiac rate control, Started on Eliquis per neuro Dr Erlinda Hong, no sign of bleeding 8. Hyperlipidemia. Lipitor  9. GERD. Protonix 10.  Bladder: repeat ucx pending, recently finished abx for uti   LOS (Days) 15 A FACE TO FACE EVALUATION WAS PERFORMED  Meloney Feld T 04/12/2014, 9:39 AM

## 2014-04-12 NOTE — Progress Notes (Signed)
Occupational Therapy Weekly Progress Note  Patient Details  Name: Erin [name] MRN: [id number] Date of Birth: [birth year]  Beginning of progress report period: April 06, 2014 End of progress report period: April 12, 2014  Today's Date: 04/12/2014 OT Individual Time: 1250-1350     and Today's Date: 04/12/2014 OT Co-Treatment Time: 1500-1530 OT Co-Treatment Time Calculation (min): 30 min   Patient has met 1 of 4 short term goals.  Pt is making progress towards additional short terms goals. Pt transferring with Mod A of 1. Some active return in R UE. OT continues to utilize hand over hand technique to incorporate R UE in B& D session.   Patient continues to demonstrate the following deficits: decreased activity tolerance, R UE AROM and strength, standing balance, increased pain, increased fatigue, and decreased I in self care and therefore will continue to benefit from skilled OT intervention to enhance overall performance with BADL.  Patient progressing toward long term goals.. Goal revisions made earlier this week to overall Mod A.  Continue plan of care.  OT Short Term Goals Week 2:  OT Short Term Goal 1 (Week 2): Pt will demonstrate ability to perform SROM at LUE with supervision.  OT Short Term Goal 1 - Progress (Week 2): Progressing toward goal OT Short Term Goal 2 (Week 2): Pt will complete toliet hygiene with mod A of 1 for clothing management. OT Short Term Goal 2 - Progress (Week 2): Progressing toward goal OT Short Term Goal 3 (Week 2): Pt will tolerate standing with max a of 1 for LB dressing.  OT Short Term Goal 3 - Progress (Week 2): Progressing toward goal OT Short Term Goal 4 (Week 2): Pt will perform sliding board transfer from bed <> w/c with max A of 1.  OT Short Term Goal 4 - Progress (Week 2): Met Week 3:  OT Short Term Goal 1 (Week 3): continue to work towards [employer]  Skilled Therapeutic Interventions/Progress Updates:  Session 1: Pt seated in w/c with 9/10 c/o  pain in lower back upon entering the room. Pt begins crying due to pain increasing. OT alerted RN but Pt refused medication at this time. OT assisted pt in getting dressing and propelled w/c to gym for repositioning in order to decrease pain. Pt transferred w/c <> mat with Mod A. Pt repositioned on mat and pt reporting decrease in pain. Pt engaged in 5 ball squeezes between knees in order to increase B LE strength. OT educated and demonstrated PNF movement patterns with R UE. Pt performing 5 reps of each exercise in supine position in order to encourage more natural movement pattern. Pt extremely fatigued at end of session and transferred back into bed with Mod A. Call bell and all needed items within reach upon exiting the room.   Session 2 (Co- treatment with PT from 1500-1600): Pt with continued pain in lower back and groin but reports 5/10 this session as pt did take pain medication from RN prior to session. Pt performed supine <> sit with Max A. Once seated on EOB pt engaged in standing with sara plus x 3 reps consisting of 3-5 minutes each time. Pt standing a fourth time and attempted ambulation but unable to advance L LE secondary to decreased ability to weight shift to the R. While standing with sara pt engaged in tic- tac-toe game requiring pt to scan to the L and also weight shift to the R. Pt required max verbal cues and manual facilitation to correct posture  and remain in an upright position. Pt rested in between standing by sitting on EOB and reached to R to obtain items to wash face in order to perform weight shifts to the R during functional task. Pt fatigued at end of session and supine in bed with call bell and all other needed items upon exiting the room.  Please see Cameron Sprang, DPT 's note for additional details.   Therapy Documentation Precautions:  Precautions Precautions: Fall Precaution Comments: subluxation of LT UE noted; per family and patient, muscle wasting at LUE d/t prior  injury (fall w/fracture) Restrictions Weight Bearing Restrictions: Yes LLE Weight Bearing: Weight bearing as tolerated ADL: ADL ADL Comments: see FIM  See FIM for current functional status  Therapy/Group: Individual Therapy and Co-Treatment  Phineas Semen 04/12/2014, 5:53 PM

## 2014-04-12 NOTE — Progress Notes (Signed)
Speech Language Pathology Daily Session Note  Patient Details  Name: Erin [name] MRN: [id number] Date of Birth: 12/01/1929  Today's Date: 04/12/2014 SLP Individual Time: 1100-1200 SLP Individual Time Calculation (min): 60 min  Short Term Goals: Week 2: SLP Short Term Goal 1 (Week 2): Pt will demonstrate effectient mastication of Dys 2 texture trilas with Mod assist  SLP Short Term Goal 1 - Progress (Week 2): Progressing toward goal SLP Short Term Goal 2 (Week 2): Pt will complete oral motor and pharyngeal strengthening exercises with min assist.  SLP Short Term Goal 2 - Progress (Week 2): Progressing toward goal SLP Short Term Goal 3 (Week 2): Pt will sustain attention to functional task for 10-12 minutes with Min assist   SLP Short Term Goal 3 - Progress (Week 2): Met SLP Short Term Goal 4 (Week 2): Pt will use external aids to facilitate improved recall of daily information with Mod assist SLP Short Term Goal 4 - Progress (Week 2): Met SLP Short Term Goal 5 (Week 2): Pt will scan to left during funcitonal tasks with Mod assist  SLP Short Term Goal 5 - Progress (Week 2): Met SLP Short Term Goal 6 (Week 2): Pt will label 2 physicl and 1 cognitive deficits post CVA with Mod question cues SLP Short Term Goal 6 - Progress (Week 2): Met  Skilled Therapeutic Interventions: Skilled treatment session focused on addressing dysphagia and cognition goals. SLP facilitated session with money sorting and counting task; patient was Mod I for scanning to the left and sorting and required Min assist verbal and visual cues to accurately count change.  SLP also facilitated session with skilled observation of lunch of Dys 1 textures and nectar-thick liquids via cup with Mod faded to Min cues to utilize cough following cup sips.  Patient demonstrated improved management of anterior loss of solids with cue x1.  Patient demonstrated no reflexive coughs throughout session.     FIM:   Comprehension Comprehension Mode: Auditory Comprehension: 5-Understands basic 90% of the time/requires cueing < 10% of the time Expression Expression Mode: Verbal Expression: 5-Expresses basic 90% of the time/requires cueing < 10% of the time. Social Interaction Social Interaction: 5-Interacts appropriately 90% of the time - Needs monitoring or encouragement for participation or interaction. Problem Solving Problem Solving: 3-Solves basic 50 - 74% of the time/requires cueing 25 - 49% of the time Memory Memory: 3-Recognizes or recalls 50 - 74% of the time/requires cueing 25 - 49% of the time FIM - Eating Eating Activity: 5: Needs verbal cues/supervision  Pain Pain Assessment Pain Assessment: No/denies pain  Therapy/Group: Individual Therapy  Carmelia Roller., [place] [phone]  Driscoll 04/12/2014, 5:31 PM

## 2014-04-12 NOTE — Progress Notes (Signed)
Physical Therapy Weekly Progress Note  Patient Details  Name: Erin [name] MRN: [id number] Date of Birth: March 30, 1930  Beginning of progress report period: April 06, 2014 End of progress report period: April 12, 2014  Today's Date: 04/12/2014 PT Co-Treatment Time: 1530-1600 PT Co-Treatment Time Calculation (min): 30 min  Patient has met 2 of 4 short term goals.  Pt continues to make slow but steady progress with all aspects of mobility.  She is now max A of one person for squat pivot transfers, however continues to require +2 for toileting and ADL tasks, esp in standing.  She continues to be limited by decreased endurance and poor postural control.  We have been doing some gait with +2 assist, however is not functional at this time.   Patient continues to demonstrate the following deficits: decreased balance, decreased postural control, decreased endurance, decreased functional use of LUE/LE, pain in L groin, decreased memory and therefore will continue to benefit from skilled PT intervention to enhance overall performance with activity tolerance, balance, postural control, ability to compensate for deficits, functional use of  left upper extremity and left lower extremity, attention, awareness, coordination and knowledge of precautions.  Patient progressing toward long term goals..  Continue plan of care.  PT Short Term Goals Week 2:  PT Short Term Goal 1 (Week 2): Pt will perform bilat rolling with bed rails and Mod A of single therapist. PT Short Term Goal 1 - Progress (Week 2): Progressing toward goal PT Short Term Goal 2 (Week 2): Pt will perform supine<>sit with Mod A of single therapist. PT Short Term Goal 2 - Progress (Week 2): Progressing toward goal PT Short Term Goal 3 (Week 2): Pt will transfer from bed<>w/c with max A of single therapist. PT Short Term Goal 3 - Progress (Week 2): Met PT Short Term Goal 4 (Week 2): Pt will perform static sitting balance x60 seconds with S  of single therapist. PT Short Term Goal 4 - Progress (Week 2): Met Week 3:  PT Short Term Goal 1 (Week 3): Continue to work towards [employer]  Skilled Therapeutic Interventions/Progress Updates:   Pt received lying in bed, agreeable to session (despite pain).  Skilled co-treat with OT in order to work on sitting and standing balance/tolerance, weight shifts and scanning L environment.  Performed bed mobility with +2 assist (max A with +2 for safety and assisting OT with hand placement).  Pt did very good recalling technique of rolling prior to sitting up, however requires max verbal cues to attend to L arm with assist for L knee flex.  Once at EOB, worked on upright posture, orientation to midline, and reaching R to increase R weight shift.  Retrieved Clarise Cruz plus with walking sling in order to work on standing balance and tolerance.  Performed 3 reps of standing and on fourth rep, attempted stepping.  Tolerated 3-5 mins each rep with cues and facilitation at trunk and ribs for upright posture, glute and quad activation on LLE and increasing weight shift to the R.  Performed game of tic tac toe to work on scanning L environment first then reaching to the R for increased weight shift and trunk/core shortening/lengthening.  Pt also performed standing while taking several sips of thickened drink.  Cues for upright head and to cough after each sip.  Pt did very well with this.  Ended with attempting to take steps while in sara plus, however due to increased strength, L lateral lean and pt fatigue, she was unable  to shift enough R for adequate L step.  Assisted pt back to bed with +2 assist.  All needs in reach and PRAFO's donned, ice pack applied to B feet.    Therapy Documentation Precautions:  Precautions Precautions: Fall Precaution Comments: subluxation of LT UE noted; per family and patient, muscle wasting at LUE d/t prior injury (fall w/fracture) Restrictions Weight Bearing Restrictions: Yes LLE Weight  Bearing: Weight bearing as tolerated   Vital Signs: Therapy Vitals Temp: 98.4 F (36.9 C) Temp Source: Oral Pulse Rate: 86 Resp: 18 BP: 142/82 mmHg Patient Position (if appropriate): Lying Oxygen Therapy SpO2: 94 % Pain: Pain Assessment Pain Assessment: 0-10 Pain Score: 10-Worst pain ever Pain Location: Generalized Pain Intervention(s): Medication (See eMAR);Repositioned   Pt had gotten pain meds prior to session, but still c/o pain in groin, ice pack applied at end of session .  See FIM for current functional status  Therapy/Group: Co-Treatment  Yoshiaki Kreuser, Betha Loa 04/12/2014, 8:00 AM

## 2014-04-13 ENCOUNTER — Inpatient Hospital Stay (HOSPITAL_COMMUNITY): Payer: Medicare Other | Admitting: Occupational Therapy

## 2014-04-13 DIAGNOSIS — R269 Unspecified abnormalities of gait and mobility: Secondary | ICD-10-CM

## 2014-04-13 LAB — URINE CULTURE: Colony Count: 35000

## 2014-04-13 MED ORDER — SACCHAROMYCES BOULARDII 250 MG PO CAPS
250.0000 mg | ORAL_CAPSULE | Freq: Two times a day (BID) | ORAL | Status: DC
  Administered 2014-04-13 – 2014-04-17 (×9): 250 mg via ORAL
  Filled 2014-04-13 (×12): qty 1

## 2014-04-13 NOTE — Progress Notes (Signed)
Occupational Therapy Session Note  Patient Details  Name: Erin [name] MRN: [id number] Date of Birth: Oct 01, 1929  Today's Date: 04/13/2014 OT Individual Time: 1400-1500 OT Individual Time Calculation (min): 60 min    Short Term Goals: Week 3:  OT Short Term Goal 1 (Week 3): continue to work towards [employer]  Skilled Therapeutic Interventions/Progress Updates:  Upon entering the room, pt supine in bed with no c/o pain at rest. Pt with 6/10 c/o pain in R hip with movement but reports pain decreases with rest and repositioning. Pt reports having bath and dressed prior to session. Also, pt reports, "I did not like my lunch.I am so hungry." Pt performed PNF movements as previously educated with min verbal cues and assistance to reach end points of movement x 10 reps each while supine in bed in order to facilitate increased normal movement pattern. Pt performed supine to sit with Mod A for L LE and trunk. Once seated EOB, pt transferred from bed to wheelchair with mod physical assist. Pt seated in wheelchair while eating snack secondary to pt very hungry from lack of lunch. Pt required set up A to open containers. Pt able to bring food to mouth with increased time and use of R hand for utensil. Pt scooping food independently from container. Pt required min verbal cues for swallowing strategies. While eating, pt required mod verbal cues and min A to correct posture while seated. Pt then engaged in grooming tasks at sink side with use of mirror as visual cue for postural adjustments with min verbal cues. This was first session pt was able to independently insert dentures. Pt seated in wheelchair with call bell, quick release belt donned,and family present in room.   Therapy Documentation Precautions:  Precautions Precautions: Fall Precaution Comments: subluxation of LT UE noted; per family and patient, muscle wasting at LUE d/t prior injury (fall w/fracture) Restrictions Weight Bearing Restrictions:  Yes LLE Weight Bearing: Weight bearing as tolerated ADL: ADL ADL Comments: see FIM  See FIM for current functional status  Therapy/Group: Individual Therapy  Phineas Semen 04/13/2014, 4:03 PM

## 2014-04-13 NOTE — Progress Notes (Signed)
80 y.o. right-handed female with history of question Parkinson's disease followed by Dr. Jannifer Franklin, frequent falls, atrial fibrillation on no anti-coagulation except aspirin due to frequent falls. Patient lives with family use and occasional cane walker when needed and was still driving. Presented 03/20/2014 after mechanical fall when she lost her balance landing on her left hip without loss of consciousness and was admitted to an [facility]. X-rays and imaging revealed a left intertrochanteric hip fracture. Patient noted to be confused with some left-sided weakness. Cranial CT scan showed right parietal lobe distribution of the right middle cerebral artery consistent with acute to subacute ischemia. CTA head and neck consistent with acute infarct right basal ganglia right parietal white matter. High-grade stenosis versus occlusion of distal right M1 segment. Neurology consulted. Patient did not receive TPA. Echocardiogram with ejection fraction of 60% no wall motion abnormalities. Venous Doppler studies lower extremities negative for DVT.  Subjective/Complaints: Poor po intake. Still incontinent of bowel and bladder. Frequent urinations and bowel movements noted Review of Systems - cannot obtain secondary to communication deficits from CVA  Objective: Vital Signs: Blood pressure 107/71, pulse 74, temperature 97.9 F (36.6 C), temperature source Oral, resp. rate 17, weight 78.7 kg (173 lb 8 oz), SpO2 95.00%. Dg Swallowing Func-speech Pathology  04/11/2014   Selinda Orion Page, CCC-SLP     04/11/2014  8:25 PM   Objective Swallowing Evaluation: Modified Barium Swallowing Study   Patient Details  Name: Erin [name] MRN: [id number] Date of Birth: 16-Nov-1929  Today's Date: 04/11/2014 Time: 3299-2426    Past Medical History:  Past Medical History  Diagnosis Date  . Anxiety   . Arthritis   . Hyperlipidemia   . Essential hypertension, benign   . Osteoporosis   . GERD (gastroesophageal reflux disease)   . Hearing loss   .  Restless leg   . Tremor   . Leg edema   . Obese   . Gall bladder disease   . Cataract   . Carpal tunnel syndrome of left wrist   . Atrial fibrillation   . Sleep apnea   . Esophageal dilatation   . Frequent falls   . Parkinson disease   . History of nuclear stress test 08/31/2012    Lexiscan cardiolite negative for ischemia  . Polyneuropathy in other diseases classified elsewhere 10/03/2013    Past Surgical History:  Past Surgical History  Procedure Laterality Date  . Appendectomy    . Cholecystectomy    . Abdominal hysterectomy    . Fracture surgery      Left arm x4  . Benign cyst removed from kidney and lung, bilateral mastectomy     . Bilateral great toenail removal    . Knee arthroscopy  2012    Right knee, Dr Aline Brochure  . Left forearm    . Breast surgery      Bilateral 2000  . Tonsillectomy    . R hand surgery    . Bronchoscopy  02/15/2000  . Right vats.  "  . Right upper lobe wedge resection.  "  . Upper gastrointestinal endoscopy  11/25/1999    Esophagitis/ Normal proximal esophagus, stomach and duodenum  . Colonoscopy  01/17/2009    Dr. Deatra Ina : Internal hemorrhoids/Diverticula, scattered in the  ascending colon/  Moderate diverticulosis ascending colon to  sigmoid colon  . Esophagogastroduodenoscopy   04/23/2003    Dr. Deatra Ina: HIATAL HERNIA  . Colonoscopy  01/16/2001    Normal  . Cataract extraction, bilateral    . Esophagogastroduodenoscopy  03/20/2012    DGU:[id number] ring-LIKELY CAUSING MILD DYSPHAGIA/SMALL hiatal  hernia/Multiple sessile polyps ranging between 3-25mm , path  benign  . Cataract extraction    . Carpal tunnel release      Left wrist  . Transthoracic echocardiogram  06/24/2009    EF=>55%, mild assymetric LVH; LA mildly dilated; mild mitral  annular calcif, borderline MVP, mild-mod MR; mild-mod TR, RV  systolic pressure elevated, mild pulm HTN; AV mildly sclerotic;  mild pulm valve regurg - ordered r/t bradycardia   . Endovenous ablation saphenous vein w/ laser  01/2011    Right GSV  .  Cholecystectomy    . Intramedullary (im) nail intertrochanteric Left 03/25/2014    Procedure: INTRAMEDULLARY NAIL INTERTROCHANTRIC LEFT HIP;   Surgeon: Marianna Payment, MD;  Location: [facility];  Service:  Orthopedics;  Laterality: Left;   HPI:  80 yo female h/o freq falls, cafib, parkinsons who came in after  mechanical fall while in her kitchen, sustaining a displaced  intertrochanteric fracture of the left hip.She lives with her  son. She has atrial fibrillation without anticoagulation  secondary to frequent falls and risk of bleeding. On 03/22/2014 RN  noted that the pt was unable to swallow medications and a Code  Stroke was initiated. She was later noted to have slurred speech,  left facial droop, and an inability to move her left arm, and a  right gaze preference. CT shows acute infarct in the right basal  ganglia and right parietal white matter.  Repeat MBS ordered to  determine readiness for diet advancement.      Recommendation/Prognosis  Clinical Impression:   Dysphagia Diagnosis: Moderate oral phase dysphagia;Moderate  pharyngeal phase dysphagia Pt presents with a moderate oropharyngeal dysphagia.  Deficits  are both sensory and motor in nature. Oral phase deficits are  characterized by anterior spillage with impaired anterior to  posterior transit and lingual manipulation with all  consistencies. Abovementioned impairments, in addition to  decreased pharyngeal sensation resulted in premature spillage of  materials into the pharynx with swallow response triggered at the  level of the vallecula.  Delayed swallow initiation resulted in  silent penetration of both nectar and honey thick liquids.  No  aspiration or penetration visualized with purees or solid  consistencies.  Pt also presents with significantly improved  arousal and ability to maintain upright posture.  Given that pt's  airway protection is the same regardless of whether liquids were  nectar or honey thick and pt with decreased intake of honey  thick  liquids, SLP recommends diet upgrade to nectar thick liquids to  facilitate improved hydration with continued dys 1 solids and  full supervision.  Prognosis for advancement of solids at bedside  good with continued improvements of alertness and endurance;  however, pt will need a repeat MBS in 1-2 weeks to objectively  determine readiness for liquids advancement due to silent  penetration.     Swallow Evaluation Recommendations:  Diet Recommendations: Dysphagia 1 (Puree);Nectar-thick liquid Liquid Administration via: Cup Medication Administration: Crushed with puree Supervision: Full supervision/cueing for compensatory  strategies;Staff to assist with self feeding Compensations: Check for anterior loss;Multiple dry swallows  after each bite/sip;Slow rate;Small sips/bites Postural Changes and/or Swallow Maneuvers: Seated upright 90  degrees;Upright 30-60 min after meal Oral Care Recommendations: Oral care BID    Prognosis:  Prognosis for Safe Diet Advancement: Fair Barriers to Reach Goals: Cognitive deficits;Severity of dysphagia   Individuals Consulted: Consulted and Agree with Results and  Recommendations: Patient  SLP Assessment/Plan  Plan:   See plan of care   Short Term Goals: Week 2: SLP Short Term Goal 1 (Week 2): Pt will  demonstrate effectient mastication of Dys 2 texture trilas with  Mod assist  SLP Short Term Goal 2 (Week 2): Pt will complete oral motor and  pharyngeal strengthening exercises with min assist.  SLP Short Term Goal 3 (Week 2): Pt will sustain attention to  functional task for 10-12 minutes with Min assist   SLP Short Term Goal 4 (Week 2): Pt will use external aids to  facilitate improved recall of daily information with Mod assist SLP Short Term Goal 5 (Week 2): Pt will scan to left during  funcitonal tasks with Mod assist  SLP Short Term Goal 6 (Week 2): Pt will label 2 physicl and 1  cognitive deficits post CVA with Mod question cues    General: Date of Onset: 03/20/14 Type of  Study: Modified Barium Swallowing Study Reason for Referral: Objectively evaluate swallowing function Previous Swallow Assessment: 9/18 BSE Diet Prior to this Study: Dysphagia 1 (puree);Honey-thick liquids Temperature Spikes Noted: No Respiratory Status: Room air History of Recent Intubation: No Behavior/Cognition: Alert;Requires cueing;Cooperative Oral Cavity - Dentition: Dentures, top;Dentures, bottom Oral Motor / Sensory Function: Impaired - see Bedside swallow  eval Self-Feeding Abilities: Able to feed self Patient Positioning: Upright in chair Baseline Vocal Quality: Clear Volitional Cough: Strong Volitional Swallow: Able to elicit Anatomy: Within functional limits Pharyngeal Secretions: Not observed secondary MBS   Reason for Referral:   Objectively evaluate swallowing function    Oral Phase: Oral Preparation/Oral Phase Oral Phase: Impaired Oral - Solids Oral - Puree: Weak lingual manipulation;Incomplete tongue to  palate contact;Reduced posterior propulsion;Lingual/palatal  residue;Delayed oral transit;Right anterior bolus loss Oral - Mechanical Soft: Weak lingual manipulation;Lingual/palatal  residue;Incomplete tongue to palate contact;Delayed oral  transit;Impaired mastication;Reduced posterior propulsion   Pharyngeal Phase:  Pharyngeal Phase Pharyngeal Phase: Impaired Pharyngeal - Honey Pharyngeal - Honey Cup: Delayed swallow initiation;Premature  spillage to valleculae;Reduced epiglottic inversion;Reduced  laryngeal elevation;Reduced tongue base  retraction;Penetration/Aspiration during swallow;Compensatory  strategies attempted (Comment) (cuing for throat clear and extra  swallows ) Pharyngeal - Nectar Pharyngeal - Nectar Cup: Delayed swallow initiation;Premature  spillage to valleculae;Reduced airway/laryngeal  closure;Penetration/Aspiration during swallow;Trace  aspiration;Compensatory strategies attempted (Comment);Reduced  epiglottic inversion;Reduced laryngeal elevation;Reduced tongue  base  retraction (cuing for throat clear and extra swallows) Penetration/Aspiration details (nectar cup): Material enters  airway, remains ABOVE vocal cords and not ejected out Pharyngeal - Solids Pharyngeal - Puree: Delayed swallow initiation;Premature spillage  to valleculae;Reduced pharyngeal peristalsis;Other  (Comment);Reduced epiglottic inversion;Reduced laryngeal  elevation;Reduced tongue base retraction Pharyngeal - Mechanical Soft: Other (Comment);Delayed swallow  initiation;Premature spillage to valleculae;Reduced pharyngeal  peristalsis;Reduced epiglottic inversion;Reduced laryngeal  elevation;Reduced tongue base retraction        GN          Page, Selinda Orion 04/11/2014, 8:02 PM                    Results for orders placed during the hospital encounter of 03/28/14 (from the past 72 hour(s))  URINE CULTURE     Status: None   Collection Time    04/11/14 12:23 PM      Result Value Ref Range   Specimen Description URINE, CATHETERIZED     Special Requests NONE     Culture  Setup Time       Value: 04/11/2014 12:44     Performed at [facility]  Value: 35,000 COLONIES/ML     Performed at [facility]   Culture       Value: ESCHERICHIA COLI     Performed at [facility]   Report Status PENDING    URINALYSIS, ROUTINE W REFLEX MICROSCOPIC     Status: Abnormal   Collection Time    04/11/14 12:23 PM      Result Value Ref Range   Color, Urine YELLOW  YELLOW   APPearance HAZY (*) CLEAR   Specific Gravity, Urine 1.006  1.005 - 1.030   pH 7.5  5.0 - 8.0   Glucose, UA NEGATIVE  NEGATIVE mg/dL   Hgb urine dipstick NEGATIVE  NEGATIVE   Bilirubin Urine NEGATIVE  NEGATIVE   Ketones, ur NEGATIVE  NEGATIVE mg/dL   Protein, ur NEGATIVE  NEGATIVE mg/dL   Urobilinogen, UA 0.2  0.0 - 1.0 mg/dL   Nitrite NEGATIVE  NEGATIVE   Leukocytes, UA MODERATE (*) NEGATIVE  URINE MICROSCOPIC-ADD ON     Status: Abnormal   Collection Time    04/11/14 12:23 PM      Result  Value Ref Range   Squamous Epithelial / LPF RARE  RARE   WBC, UA 21-50  <3 WBC/hpf   Bacteria, UA FEW (*) RARE  BASIC METABOLIC PANEL     Status: Abnormal   Collection Time    04/12/14  5:00 AM      Result Value Ref Range   Sodium 142  137 - 147 mEq/L   Potassium 3.0 (*) 3.7 - 5.3 mEq/L   Chloride 102  96 - 112 mEq/L   CO2 28  19 - 32 mEq/L   Glucose, Bld 117 (*) 70 - 99 mg/dL   BUN 6  6 - 23 mg/dL   Creatinine, Ser 0.46 (*) 0.50 - 1.10 mg/dL   Calcium 8.4  8.4 - 10.5 mg/dL   GFR calc non Af Amer >90  >90 mL/min   GFR calc Af Amer >90  >90 mL/min   Comment: (NOTE)     The eGFR has been calculated using the CKD EPI equation.     This calculation has not been validated in all clinical situations.     eGFR's persistently <90 mL/min signify possible Chronic Kidney     Disease.   Anion gap 12  5 - 15      Physical Exam  obese Eyes:  Pupils reactive to light  Neck: Neck supple. No thyromegaly present.  Cardiovascular:  Cardiac rate controlled  Respiratory: Breath sounds normal. No respiratory distress.  GI: Soft. Bowel sounds are normal. She exhibits no distension.  Neurological:  Speech is dysarthric. Left central 7 and tongue deviation. Pleasantly confused  . Followed simple commands. Limited awareness and insight. She does have a right gaze preference. LUE is tr to 2/5 deltoid, bicep, tricep 1/5 Grip. LLE limited by pain. 1+ HF, 1 KE and ADF.APF. No withdrawal to pinch in Left hand Normal strength on the right side  Skin:  Her hip incision is not tender,     Assessment/Plan: 1. Functional deficits secondary to left hemiparesis from right CVA as well as left intertrochanteric hip fracture status post ORIF 03/25/2014 which require 3+ hours per day of interdisciplinary therapy in a comprehensive inpatient rehab setting. Physiatrist is providing close team supervision and 24 hour management of active medical problems listed below. Physiatrist and rehab team continue to assess  barriers to discharge/monitor patient progress toward functional and medical goals. Team conference today please see  physician documentation under team conference tab, met with team face-to-face to discuss problems,progress, and goals. Formulized individual treatment plan based on medical history, underlying problem and comorbidities. FIM: FIM - Bathing Bathing Steps Patient Completed: Chest;Left Arm;Abdomen;Front perineal area Bathing: 2: Max-Patient completes 3-4 72f 10 parts or 25-49%  FIM - Upper Body Dressing/Undressing Upper body dressing/undressing steps patient completed: Thread/unthread right sleeve of pullover shirt/dresss;Thread/unthread left sleeve of pullover shirt/dress Upper body dressing/undressing: 3: Mod-Patient completed 50-74% of tasks FIM - Lower Body Dressing/Undressing Lower body dressing/undressing: 1: Two helpers  FIM - Musician Devices: Grab bar or rail for support Toileting: 1: Two helpers  FIM - Radio producer Devices: Recruitment consultant Transfers: 1-Two helpers  FIM - Control and instrumentation engineer Devices: Arm rests Bed/Chair Transfer: 2: Bed > Chair or W/C: Max A (lift and lower assist)  FIM - Locomotion: Wheelchair Distance: 20 Locomotion: Wheelchair: 0: Activity did not occur FIM - Locomotion: Ambulation Locomotion: Ambulation Assistive Devices: Other (comment) (one person positioned under RUE, other at L side) Ambulation/Gait Assistance: 1: +2 Total assist Locomotion: Ambulation: 1: Two helpers  Comprehension Comprehension Mode: Auditory Comprehension: 5-Understands basic 90% of the time/requires cueing < 10% of the time  Expression Expression Mode: Verbal Expression: 5-Expresses basic 90% of the time/requires cueing < 10% of the time.  Social Interaction Social Interaction: 5-Interacts appropriately 90% of the time - Needs monitoring or encouragement for participation or  interaction.  Problem Solving Problem Solving Mode: Asleep Problem Solving: 3-Solves basic 50 - 74% of the time/requires cueing 25 - 49% of the time  Memory Memory Mode: Asleep Memory: 3-Recognizes or recalls 50 - 74% of the time/requires cueing 25 - 49% of the time Medical Problem List and Plan:  1. Functional deficits secondary to an embolic right parietal basal ganglia infarcts as well as left intertrochanteric hip fracture. Status post ORIF 03/25/2014. Weightbearing as tolerated , poor awareness of def, appreciate Ortho f/u 2. DVT Prophylaxis/Anticoagulation: Subcutaneous heparin. Monitor platelet counts any signs of bleeding. Venous Doppler studies negative  3. Pain Management: Oxycodone as needed. Monitor with increased mobility  4. Dysphagia. Dysphagia 1 honey thick liquids.   -hs ivf .    -replace potassium  -recheck labs monday 5. Neuropsych: This patient is not capable of making decisions on her own behalf.  6. Skin/Wound Care: Routine skin checks  7. Hypertension/atrial fibrillation. Lopressor 25 mg 3 times a day. Cardiac rate control, Started on Eliquis per neuro Dr Erlinda Hong, no sign of bleeding 8. Hyperlipidemia. Lipitor  9. GERD. Protonix 10.  Bladder: repeat ucx again with ecoli (only 35k)  -cipro for 5 days  -timed toileting 11. Bowel incontinence---stop senna-s  -add florastor bid   LOS (Days) 16 A FACE TO FACE EVALUATION WAS PERFORMED  Rmani Kapusta T 04/13/2014, 9:11 AM

## 2014-04-13 NOTE — Progress Notes (Signed)
Speech Language Pathology Weekly Progress and Session Note  Patient Details  Name: Erin [name] MRN: [id number] Date of Birth: April 21, 1930  Beginning of progress report period: April 05, 2014 End of progress report period: April 13, 2014   Short Term Goals: Week 2: SLP Short Term Goal 1 (Week 2): Pt will demonstrate effectient mastication of Dys 2 texture trilas with Mod assist  SLP Short Term Goal 1 - Progress (Week 2): Progressing toward goal SLP Short Term Goal 2 (Week 2): Pt will complete oral motor and pharyngeal strengthening exercises with min assist.  SLP Short Term Goal 2 - Progress (Week 2): Progressing toward goal SLP Short Term Goal 3 (Week 2): Pt will sustain attention to functional task for 10-12 minutes with Min assist   SLP Short Term Goal 3 - Progress (Week 2): Met SLP Short Term Goal 4 (Week 2): Pt will use external aids to facilitate improved recall of daily information with Mod assist SLP Short Term Goal 4 - Progress (Week 2): Met SLP Short Term Goal 5 (Week 2): Pt will scan to left during funcitonal tasks with Mod assist  SLP Short Term Goal 5 - Progress (Week 2): Met SLP Short Term Goal 6 (Week 2): Pt will label 2 physicl and 1 cognitive deficits post CVA with Mod question cues SLP Short Term Goal 6 - Progress (Week 2): Met    New Short Term Goals: Week 3: SLP Short Term Goal 1 (Week 3): Pt will demonstrate effectient mastication of Dys 2 texture trilas with Mod assist  SLP Short Term Goal 2 (Week 3): Pt will complete oral motor and pharyngeal strengthening exercises with min assist.  SLP Short Term Goal 3 (Week 3): Pt will use external aids to facilitate improved recall of daily information with Min assist SLP Short Term Goal 4 (Week 3): Pt will scan to left during funcitonal tasks with Min assist  SLP Short Term Goal 5 (Week 3): Pt will request help as needed with Mod question cues   Weekly Progress Updates: Patient has made functional gains and has met  4 out of 6 short term goals this reporting period due to improved carryover of safe swallow strategies with Dys 1 textures and honey-thick liquids as well as improved sustained attention for short periods of time.  Currently, patient continues to require Min-Mod assist when not fatigued and Max assist when fatigued.  Patient and family education ongoing.  Patient's poor intellectual awareness and left inattention appear to be very limiting factors and as a result, goals were added to address these impairments.   Patient would benefit from continued skilled SLP intervention to maximize her functional independence prior to discharge with 24/7 hands on assist.    Intensity: Minumum of 1-2 x/day, 30 to 90 minutes Frequency: 5 out of 7 days Duration/Length of Stay: SNF TBD Treatment/Interventions: Patient/family education;Functional tasks;Cueing hierarchy;Dysphagia/aspiration precaution training;Cognitive remediation/compensation;Internal/external aids;Speech/Language facilitation;Oral motor exercises  Carmelia Roller., CCC-SLP [phone]  Tashira Torre 04/13/2014, 7:54 AM

## 2014-04-14 ENCOUNTER — Inpatient Hospital Stay (HOSPITAL_COMMUNITY): Payer: Medicare Other | Admitting: Physical Therapy

## 2014-04-14 DIAGNOSIS — M6281 Muscle weakness (generalized): Secondary | ICD-10-CM

## 2014-04-14 MED ORDER — MEGESTROL ACETATE 400 MG/10ML PO SUSP
400.0000 mg | Freq: Two times a day (BID) | ORAL | Status: DC
  Administered 2014-04-14 – 2014-04-17 (×7): 400 mg via ORAL
  Filled 2014-04-14 (×9): qty 10

## 2014-04-14 NOTE — Progress Notes (Signed)
Dr. Naaman Plummer notified of patient's BP 124/54 and HR 57; due Lopressor 25 mg tonight.  Order received: give Lopressor 25 mg tonight.

## 2014-04-14 NOTE — Progress Notes (Signed)
Physical Therapy Session Note  Patient Details  Name: Erin [name] MRN: [id number] Date of Birth: May 06, 1930  Today's Date: 04/14/2014 PT Individual Time: 1105-1135 PT Individual Time Calculation (min): 30 min   Short Term Goals: Week 1:  PT Short Term Goal 1 (Week 1): Pt will perform bilat rolling with bed rails and Max A of single therapist. PT Short Term Goal 1 - Progress (Week 1): Met PT Short Term Goal 2 (Week 1): Pt will perform supine<>sit with Max A of single therapist. PT Short Term Goal 2 - Progress (Week 1): Met PT Short Term Goal 3 (Week 1): Pt will transfer from bed<>w/c with Total A of single therapist. PT Short Term Goal 3 - Progress (Week 1): Progressing toward goal PT Short Term Goal 4 (Week 1): Pt will ambulate x10' in controlled environment with +2A. PT Short Term Goal 4 - Progress (Week 1): Not progressing PT Short Term Goal 5 (Week 1): Pt will perform static sitting balance x60 seconds with min A of single therapist. PT Short Term Goal 5 - Progress (Week 1): Met  Skilled Therapeutic Interventions/Progress Updates:  Pt was seen bedside in the am, sitting up in tilt in space w/c. Pt sleeping upon entering the room, pt awoke to verbal stimuli and willing to participate with therapy. Treatment focused LE strengthening, performed LE exercises, 2 sets x 10 reps each, hip flex and LAQs. Pt transferred sit to stand with max A, pt tolerated standing about 20 seconds with max A. Following tx, pt positioned comfortably in tilt in space w/c with call bell within reach,    Therapy Documentation Precautions:  Precautions Precautions: Fall Precaution Comments: subluxation of LT UE noted; per family and patient, muscle wasting at LUE d/t prior injury (fall w/fracture) Restrictions Weight Bearing Restrictions: Yes LLE Weight Bearing: Weight bearing as tolerated General:   Pain: Pt c/o mild to mod cervical neck pain.   See FIM for current functional status  Therapy/Group:  Individual Therapy  Dub Amis 04/14/2014, 3:15 PM

## 2014-04-14 NOTE — Progress Notes (Signed)
80 y.o. right-handed female with history of question Parkinson's disease followed by Dr. Jannifer Franklin, frequent falls, atrial fibrillation on no anti-coagulation except aspirin due to frequent falls. Patient lives with family use and occasional cane walker when needed and was still driving. Presented 03/20/2014 after mechanical fall when she lost her balance landing on her left hip without loss of consciousness and was admitted to an [facility]. X-rays and imaging revealed a left intertrochanteric hip fracture. Patient noted to be confused with some left-sided weakness. Cranial CT scan showed right parietal lobe distribution of the right middle cerebral artery consistent with acute to subacute ischemia. CTA head and neck consistent with acute infarct right basal ganglia right parietal white matter. High-grade stenosis versus occlusion of distal right M1 segment. Neurology consulted. Patient did not receive TPA. Echocardiogram with ejection fraction of 60% no wall motion abnormalities. Venous Doppler studies lower extremities negative for DVT.  Subjective/Complaints: Still eating next to nothing. Stools better yesterday Review of Systems - limited secondary to communication deficits from CVA  Objective: Vital Signs: Blood pressure 124/43, pulse 89, temperature 97.6 F (36.4 C), temperature source Oral, resp. rate 18, weight 78.7 kg (173 lb 8 oz), SpO2 94.00%. No results found. Results for orders placed during the hospital encounter of 03/28/14 (from the past 72 hour(s))  URINE CULTURE     Status: None   Collection Time    04/11/14 12:23 PM      Result Value Ref Range   Specimen Description URINE, CATHETERIZED     Special Requests NONE     Culture  Setup Time       Value: 04/11/2014 12:44     Performed at [facility]       Value: 35,000 COLONIES/ML     Performed at [facility]   Culture       Value: ESCHERICHIA COLI     Performed at [facility]   Report Status  04/13/2014 FINAL     Organism ID, Bacteria ESCHERICHIA COLI    URINALYSIS, ROUTINE W REFLEX MICROSCOPIC     Status: Abnormal   Collection Time    04/11/14 12:23 PM      Result Value Ref Range   Color, Urine YELLOW  YELLOW   APPearance HAZY (*) CLEAR   Specific Gravity, Urine 1.006  1.005 - 1.030   pH 7.5  5.0 - 8.0   Glucose, UA NEGATIVE  NEGATIVE mg/dL   Hgb urine dipstick NEGATIVE  NEGATIVE   Bilirubin Urine NEGATIVE  NEGATIVE   Ketones, ur NEGATIVE  NEGATIVE mg/dL   Protein, ur NEGATIVE  NEGATIVE mg/dL   Urobilinogen, UA 0.2  0.0 - 1.0 mg/dL   Nitrite NEGATIVE  NEGATIVE   Leukocytes, UA MODERATE (*) NEGATIVE  URINE MICROSCOPIC-ADD ON     Status: Abnormal   Collection Time    04/11/14 12:23 PM      Result Value Ref Range   Squamous Epithelial / LPF RARE  RARE   WBC, UA 21-50  <3 WBC/hpf   Bacteria, UA FEW (*) RARE  BASIC METABOLIC PANEL     Status: Abnormal   Collection Time    04/12/14  5:00 AM      Result Value Ref Range   Sodium 142  137 - 147 mEq/L   Potassium 3.0 (*) 3.7 - 5.3 mEq/L   Chloride 102  96 - 112 mEq/L   CO2 28  19 - 32 mEq/L   Glucose, Bld 117 (*) 70 -  99 mg/dL   BUN 6  6 - 23 mg/dL   Creatinine, Ser 0.46 (*) 0.50 - 1.10 mg/dL   Calcium 8.4  8.4 - 10.5 mg/dL   GFR calc non Af Amer >90  >90 mL/min   GFR calc Af Amer >90  >90 mL/min   Comment: (NOTE)     The eGFR has been calculated using the CKD EPI equation.     This calculation has not been validated in all clinical situations.     eGFR's persistently <90 mL/min signify possible Chronic Kidney     Disease.   Anion gap 12  5 - 15      Physical Exam  obese Eyes:  Pupils reactive to light  Neck: Neck supple. No thyromegaly present.  Cardiovascular:  Cardiac rate controlled  Respiratory: Breath sounds normal. No respiratory distress.  GI: Soft. Bowel sounds are normal. She exhibits no distension.  Neurological:  Speech is dysarthric. Left central 7 and tongue deviation. Pleasantly confused  .  Followed simple commands. Limited awareness and insight. She does have a right gaze preference. LUE is tr to 2/5 deltoid, bicep, tricep 1/5 Grip. LLE limited by pain. 1+ HF, 1 KE and ADF.APF. No withdrawal to pinch in Left hand Normal strength on the right side  Skin:  Her hip incision is not tender,     Assessment/Plan: 1. Functional deficits secondary to left hemiparesis from right CVA as well as left intertrochanteric hip fracture status post ORIF 03/25/2014 which require 3+ hours per day of interdisciplinary therapy in a comprehensive inpatient rehab setting. Physiatrist is providing close team supervision and 24 hour management of active medical problems listed below. Physiatrist and rehab team continue to assess barriers to discharge/monitor patient progress toward functional and medical goals. Team conference today please see physician documentation under team conference tab, met with team face-to-face to discuss problems,progress, and goals. Formulized individual treatment plan based on medical history, underlying problem and comorbidities. FIM: FIM - Bathing Bathing Steps Patient Completed: Chest;Left Arm;Abdomen;Front perineal area Bathing: 2: Max-Patient completes 3-4 29f 10 parts or 25-49%  FIM - Upper Body Dressing/Undressing Upper body dressing/undressing steps patient completed: Thread/unthread right sleeve of pullover shirt/dresss;Thread/unthread left sleeve of pullover shirt/dress Upper body dressing/undressing: 3: Mod-Patient completed 50-74% of tasks FIM - Lower Body Dressing/Undressing Lower body dressing/undressing: 1: Two helpers  FIM - Musician Devices: Grab bar or rail for support Toileting: 1: Two helpers  FIM - Radio producer Devices: Recruitment consultant Transfers: 1-Two helpers  FIM - Control and instrumentation engineer Devices: Arm rests Bed/Chair Transfer: 3: Supine > Sit: Mod A (lifting  assist/Pt. 50-74%/lift 2 legs;3: Bed > Chair or W/C: Mod A (lift or lower assist)  FIM - Locomotion: Wheelchair Distance: 20 Locomotion: Wheelchair: 0: Activity did not occur FIM - Locomotion: Ambulation Locomotion: Ambulation Assistive Devices: Other (comment) (one person positioned under RUE, other at L side) Ambulation/Gait Assistance: 1: +2 Total assist Locomotion: Ambulation: 1: Two helpers  Comprehension Comprehension Mode: Auditory Comprehension: 5-Understands complex 90% of the time/Cues < 10% of the time  Expression Expression Mode: Verbal Expression: 5-Expresses complex 90% of the time/cues < 10% of the time  Social Interaction Social Interaction: 5-Interacts appropriately 90% of the time - Needs monitoring or encouragement for participation or interaction.  Problem Solving Problem Solving Mode: Asleep Problem Solving: 3-Solves basic 50 - 74% of the time/requires cueing 25 - 49% of the time  Memory Memory Mode: Asleep Memory: 3-Recognizes or recalls 50 -  74% of the time/requires cueing 25 - 49% of the time Medical Problem List and Plan:  1. Functional deficits secondary to an embolic right parietal basal ganglia infarcts as well as left intertrochanteric hip fracture. Status post ORIF 03/25/2014. Weightbearing as tolerated , poor awareness of def, appreciate Ortho f/u 2. DVT Prophylaxis/Anticoagulation: Subcutaneous heparin. Monitor platelet counts any signs of bleeding. Venous Doppler studies negative  3. Pain Management: Oxycodone as needed. Monitor with increased mobility  4. Dysphagia. Dysphagia 1 honey thick liquids.   -hs ivf .    -replace potassium  -add megace to improve intake  -recheck labs monday 5. Neuropsych: This patient is not capable of making decisions on her own behalf.  6. Skin/Wound Care: Routine skin checks  7. Hypertension/atrial fibrillation. Lopressor 25 mg 3 times a day. Cardiac rate control, Started on Eliquis per neuro Dr Erlinda Hong, no sign of  bleeding 8. Hyperlipidemia. Lipitor  9. GERD. Protonix 10.  Bladder: repeat ucx again with ecoli (only 35k)  -cipro for 5 days  -timed toileting 11. Bowel incontinence---stopped senna-s  -added florastor bid  -stool appeared more formed yesterday   LOS (Days) 17 A FACE TO FACE EVALUATION WAS PERFORMED  Lamoine Magallon T 04/14/2014, 8:08 AM

## 2014-04-15 ENCOUNTER — Inpatient Hospital Stay (HOSPITAL_COMMUNITY): Payer: Medicare Other | Admitting: Speech Pathology

## 2014-04-15 ENCOUNTER — Inpatient Hospital Stay (HOSPITAL_COMMUNITY): Payer: Medicare Other | Admitting: *Deleted

## 2014-04-15 ENCOUNTER — Inpatient Hospital Stay (HOSPITAL_COMMUNITY): Payer: Medicare Other | Admitting: Physical Therapy

## 2014-04-15 DIAGNOSIS — S72142D Displaced intertrochanteric fracture of left femur, subsequent encounter for closed fracture with routine healing: Secondary | ICD-10-CM

## 2014-04-15 LAB — BASIC METABOLIC PANEL
ANION GAP: 9 (ref 5–15)
BUN: 6 mg/dL (ref 6–23)
CHLORIDE: 106 meq/L (ref 96–112)
CO2: 26 meq/L (ref 19–32)
Calcium: 8.2 mg/dL — ABNORMAL LOW (ref 8.4–10.5)
Creatinine, Ser: 0.48 mg/dL — ABNORMAL LOW (ref 0.50–1.10)
GFR calc Af Amer: >90 mL/min (ref >90)
GFR calc non Af Amer: 89 mL/min — ABNORMAL LOW (ref >90)
GLUCOSE: 111 mg/dL — AB (ref 70–99)
POTASSIUM: 3.7 meq/L (ref 3.7–5.3)
SODIUM: 141 meq/L (ref 137–147)

## 2014-04-15 NOTE — Progress Notes (Signed)
80 y.o. right-handed female with history of question Parkinson's disease followed by Dr. Jannifer Franklin, frequent falls, atrial fibrillation on no anti-coagulation except aspirin due to frequent falls. Patient lives with family use and occasional cane walker when needed and was still driving. Presented 03/20/2014 after mechanical fall when she lost her balance landing on her left hip without loss of consciousness and was admitted to an [facility]. X-rays and imaging revealed a left intertrochanteric hip fracture. Patient noted to be confused with some left-sided weakness. Cranial CT scan showed right parietal lobe distribution of the right middle cerebral artery consistent with acute to subacute ischemia. CTA head and neck consistent with acute infarct right basal ganglia right parietal white matter. High-grade stenosis versus occlusion of distal right M1 segment. Neurology consulted. Patient did not receive TPA. Echocardiogram with ejection fraction of 60% no wall motion abnormalities. Venous Doppler studies lower extremities negative for DVT.  Subjective/Complaints: Poor intake, on supplemental IV, may need midline Review of Systems - limited secondary to communication deficits from CVA  Objective: Vital Signs: Blood pressure 102/71, pulse 97, temperature 98.1 F (36.7 C), temperature source Oral, resp. rate 20, weight 78.7 kg (173 lb 8 oz), SpO2 94.00%. No results found. Results for orders placed during the hospital encounter of 03/28/14 (from the past 72 hour(s))  BASIC METABOLIC PANEL     Status: Abnormal   Collection Time    04/15/14  5:46 AM      Result Value Ref Range   Sodium 141  137 - 147 mEq/L   Potassium 3.7  3.7 - 5.3 mEq/L   Chloride 106  96 - 112 mEq/L   CO2 26  19 - 32 mEq/L   Glucose, Bld 111 (*) 70 - 99 mg/dL   BUN 6  6 - 23 mg/dL   Creatinine, Ser 0.48 (*) 0.50 - 1.10 mg/dL   Calcium 8.2 (*) 8.4 - 10.5 mg/dL   GFR calc non Af Amer 89 (*) >90 mL/min   GFR calc Af Amer >90  >90 mL/min    Comment: (NOTE)     The eGFR has been calculated using the CKD EPI equation.     This calculation has not been validated in all clinical situations.     eGFR's persistently <90 mL/min signify possible Chronic Kidney     Disease.   Anion gap 9  5 - 15      Physical Exam  obese Eyes:  Pupils reactive to light  Neck: Neck supple. No thyromegaly present.  Cardiovascular:  Cardiac rate controlled  Respiratory: Breath sounds normal. No respiratory distress.  GI: Soft. Bowel sounds are normal. She exhibits no distension.  Neurological:  Speech is dysarthric. Left central 7 and tongue deviation. Pleasantly confused  . Followed simple commands. Limited awareness and insight. She does have a right gaze preference. LUE is tr to 2/5 deltoid, bicep, tricep 1/5 Grip. LLE limited by pain. 1+ HF, 1 KE and ADF.APF. No withdrawal to pinch in Left hand Normal strength on the right side  Skin:  Her hip incision is not tender,     Assessment/Plan: 1. Functional deficits secondary to left hemiparesis from right CVA as well as left intertrochanteric hip fracture status post ORIF 03/25/2014 which require 3+ hours per day of interdisciplinary therapy in a comprehensive inpatient rehab setting. Physiatrist is providing close team supervision and 24 hour management of active medical problems listed below. Physiatrist and rehab team continue to assess barriers to discharge/monitor patient progress toward functional and medical goals. Will  need midline placed for IVF prior to SNF transfer  FIM: FIM - Bathing Bathing Steps Patient Completed: Chest;Left Arm;Abdomen;Front perineal area Bathing: 2: Max-Patient completes 3-4 38f10 parts or 25-49%  FIM - Upper Body Dressing/Undressing Upper body dressing/undressing steps patient completed: Thread/unthread right sleeve of pullover shirt/dresss;Thread/unthread left sleeve of pullover shirt/dress Upper body dressing/undressing: 3: Mod-Patient completed 50-74% of  tasks FIM - Lower Body Dressing/Undressing Lower body dressing/undressing: 1: Two helpers  FIM - TMusicianDevices: Grab bar or rail for support Toileting: 1: Two helpers  FIM - TRadio producerDevices: BRecruitment consultantTransfers: 1-Two helpers  FIM - BControl and instrumentation engineerDevices: Arm rests Bed/Chair Transfer: 1: Two helpers  FIM - Locomotion: Wheelchair Distance: 20 Locomotion: Wheelchair: 0: Activity did not occur FIM - Locomotion: Ambulation Locomotion: Ambulation Assistive Devices: Other (comment) (one person positioned under RUE, other at L side) Ambulation/Gait Assistance: 1: +2 Total assist Locomotion: Ambulation: 1: Two helpers  Comprehension Comprehension Mode: Auditory Comprehension: 5-Understands complex 90% of the time/Cues < 10% of the time  Expression Expression Mode: Verbal Expression: 5-Expresses complex 90% of the time/cues < 10% of the time  Social Interaction Social Interaction: 5-Interacts appropriately 90% of the time - Needs monitoring or encouragement for participation or interaction.  Problem Solving Problem Solving Mode: Asleep Problem Solving: 3-Solves basic 50 - 74% of the time/requires cueing 25 - 49% of the time  Memory Memory Mode: Asleep Memory: 3-Recognizes or recalls 50 - 74% of the time/requires cueing 25 - 49% of the time Medical Problem List and Plan:  1. Functional deficits secondary to an embolic right parietal basal ganglia infarcts as well as left intertrochanteric hip fracture. Status post ORIF 03/25/2014. Weightbearing as tolerated , poor awareness of def, appreciate Ortho f/u 2. DVT Prophylaxis/Anticoagulation: Subcutaneous heparin. Monitor platelet counts any signs of bleeding. Venous Doppler studies negative  3. Pain Management: Oxycodone as needed. Monitor with increased mobility  4. Dysphagia. Dysphagia 1 honey thick liquids.   -hs ivf .     -replace potassium  -add megace to improve intake  -recheck labs monday 5. Neuropsych: This patient is not capable of making decisions on her own behalf.  6. Skin/Wound Care: Routine skin checks  7. Hypertension/atrial fibrillation. Lopressor 25 mg 3 times a day. Cardiac rate control, Started on Eliquis per neuro Dr XErlinda Hong no sign of bleeding 8. Hyperlipidemia. Lipitor  9. GERD. Protonix 10.  Bladder: repeat ucx again with ecoli (only 35k)  -cipro for 5 days  -timed toileting 11. Bowel incontinence---stopped senna-s  -added florastor bid  -stool appeared more formed yesterday   LOS (Days) 18 A FACE TO FACE EVALUATION WAS PERFORMED  Erin [name] E 04/15/2014, 8:05 AM

## 2014-04-15 NOTE — Progress Notes (Signed)
NUTRITION FOLLOW-UP  INTERVENTION:  Continue Magic cup TID between meals, each supplement provides 290 kcal and 9 grams of protein.  Encourage PO intake.  NUTRITION DIAGNOSIS: Inadequate oral intake related to dislike of food as evidenced by overall meal completion of 0-50%; ongoing  Goal: Pt to meet >/= 90% of their estimated nutrition needs; not met  Monitor:  PO intake, weight trends, labs, I/O's  80 y.o. female  Admitting Dx: CVA  ASSESSMENT: Pt with history of question Parkinson's disease followed by Dr. Jannifer Franklin, frequent falls, atrial fibrillation. Presented 03/20/2014 after mechanical fall. X-rays and imaging revealed a left intertrochanteric hip fracture. Cranial CT scan showed right parietal lobe distribution of the right middle cerebral artery consistent with acute to subacute ischemia.  9/18-Pt reports having a good appetite, however meal completion is 0-50%. Family reports pt may not like her dysphagia 1 diet as she would like to have "not mushy foods". Family reports prior to hospitalization pt was eating great with no difficulties. Pt reports she has not lost weight with her usual body weight of 157 lbs. Pt refused supplements,however is willing to consume Magic cup. Will order. Pt was encouraged to eat her food at meals to obtain adequate nutrition. Pt with no observed significant fat or muscle mass loss.  9/22- Meal completion is 10-40%. Pt reports her appetite has been improving and she has also been eating her Magic cups. Pt was fatigued and sleepy during time of visit. Pt was encouraged to eat her food at meals and to continue eating her Magic cups. Snacks and other supplements were offered to pt, however pt declined saying she will just continue her Magic cups.  9/29- Meal completion has improved; PO: 30-40%. Pt is continuing trial of dysphagia 2 texture diet. Pt is demonstrating mild pocketing while eating, per SLP. Noted a 5.4% wt gain over the past 2-3 weeks, likely  due to improved PO intake. She continues to accept her Magic Cups well.   10/5- Diet downgraded to Dysphagia 1 with nectar thick liquids on 10/1. PO intake variable, patient refusing some meals, but consumed 100% of breakfast 10/4. Overall average intake of meals is </= 50%. Patient continues to take medications with Magic Cups.  Height: Ht Readings from Last 1 Encounters:  03/20/14 $RemoveB'5\' 6"'uKSDJYMc$  (1.676 m)    Weight: Wt Readings from Last 1 Encounters:  04/05/14 173 lb 8 oz (78.7 kg)  03/28/14  164 lb (74.39 kg)   BMI:  Body mass index is 28.02 kg/(m^2).  Re-Estimated Nutritional Needs: Kcal: 1700-1900 Protein: 84-94 grams Fluid: 1.7 - 1.9 L/day  Skin: incision left leg and left hip  Diet Order: Dysphagia 1 with nectar thick liquids   Intake/Output Summary (Last 24 hours) at 04/15/14 1325 Last data filed at 04/15/14 0900  Gross per 24 hour  Intake     60 ml  Output   1475 ml  Net  -1415 ml    Last BM: 10/4  Labs:   Recent Labs Lab 04/12/14 0500 04/15/14 0546  NA 142 141  K 3.0* 3.7  CL 102 106  CO2 28 26  BUN 6 6  CREATININE 0.46* 0.48*  CALCIUM 8.4 8.2*  GLUCOSE 117* 111*    CBG (last 3)  No results found for this basename: GLUCAP,  in the last 72 hours  Scheduled Meds: . apixaban  5 mg Oral BID  . atorvastatin  40 mg Oral q1800  . ciprofloxacin  250 mg Oral BID  . esomeprazole  20 mg  Oral BID  . megestrol  400 mg Oral BID  . metoprolol tartrate  25 mg Oral TID  . potassium chloride  20 mEq Oral BID  . saccharomyces boulardii  250 mg Oral BID    Continuous Infusions: . sodium chloride 75 mL/hr at 04/15/14 0501    Past Medical History  Diagnosis Date  . Anxiety   . Arthritis   . Hyperlipidemia   . Essential hypertension, benign   . Osteoporosis   . GERD (gastroesophageal reflux disease)   . Hearing loss   . Restless leg   . Tremor   . Leg edema   . Obese   . Gall bladder disease   . Cataract   . Carpal tunnel syndrome of left wrist   .  Atrial fibrillation   . Sleep apnea   . Esophageal dilatation   . Frequent falls   . Parkinson disease   . History of nuclear stress test 08/31/2012    Lexiscan cardiolite negative for ischemia  . Polyneuropathy in other diseases classified elsewhere 10/03/2013    Past Surgical History  Procedure Laterality Date  . Appendectomy    . Cholecystectomy    . Abdominal hysterectomy    . Fracture surgery      Left arm x4  . Benign cyst removed from kidney and lung, bilateral mastectomy    . Bilateral great toenail removal    . Knee arthroscopy  2012    Right knee, Dr Aline Brochure  . Left forearm    . Breast surgery      Bilateral 2000  . Tonsillectomy    . R hand surgery    . Bronchoscopy  02/15/2000  . Right vats.  "  . Right upper lobe wedge resection.  "  . Upper gastrointestinal endoscopy  11/25/1999    Esophagitis/ Normal proximal esophagus, stomach and duodenum  . Colonoscopy  01/17/2009    Dr. Deatra Ina : Internal hemorrhoids/Diverticula, scattered in the ascending colon/  Moderate diverticulosis ascending colon to sigmoid colon  . Esophagogastroduodenoscopy   04/23/2003    Dr. Deatra Ina: HIATAL HERNIA  . Colonoscopy  01/16/2001    Normal  . Cataract extraction, bilateral    . Esophagogastroduodenoscopy  03/20/2012    UJW:JXBJYNWG ring-LIKELY CAUSING MILD DYSPHAGIA/SMALL hiatal hernia/Multiple sessile polyps ranging between 3-62mm , path benign  . Cataract extraction    . Carpal tunnel release      Left wrist  . Transthoracic echocardiogram  06/24/2009    EF=>55%, mild assymetric LVH; LA mildly dilated; mild mitral annular calcif, borderline MVP, mild-mod MR; mild-mod TR, RV systolic pressure elevated, mild pulm HTN; AV mildly sclerotic; mild pulm valve regurg - ordered r/t bradycardia   . Endovenous ablation saphenous vein w/ laser  01/2011    Right GSV  . Cholecystectomy    . Intramedullary (im) nail intertrochanteric Left 03/25/2014    Procedure: INTRAMEDULLARY NAIL  INTERTROCHANTRIC LEFT HIP;  Surgeon: Marianna Payment, MD;  Location: [place];  Service: Orthopedics;  Laterality: Left;    Molli Barrows, RD, LDN, [place] Pager [phone] After Hours Pager [phone]

## 2014-04-15 NOTE — Progress Notes (Signed)
Occupational Therapy Note  Patient Details  Name: Erin [name] MRN: [id number] Date of Birth: Oct 09, 1929  Today's Date: 04/15/2014 OT Co-Treatment Time: 1300-1330 (Cotx with PT-total time 1300-1400) OT Co-Treatment Time Calculation (min): 30 min Pt c/o pain in left hip/upper leg with activity; unrated; RN aware and respositioned Cotreatment with PT  Pt resting in w/c upon arrival and agreeable to participating in therapy. Pt performed squat pivot/scoot transfer w/c to therapy mat with max A.  Pt initially engaged in static standing task with Harmon Pier walker.  Pt required tot A + 2 (pt=50%) to stand X 2.  Pt required max multimodal cures/manual facilitation to shift weight to right while standing.  Pt also required stabilization of left knee while standing.  Pt transitioned to dynamic sitting tasks while weight bearing through LUE to reach across midline with RUE to grasp objects.  Pt exhibited weight shift to right while sitting when reaching with RUE to place object in therapist's hand.  Pt transitioned to supine on mat and engaged in LUE AROM. Pt exhibited increased movement with shoulder and elbow flexion but required support distally when performing AROM.  Focus on activity tolerance, dynamic sitting balance, static standing balance, sit<>stand, transfers, and safety awareness.   Leotis Shames [facility] 04/15/2014, 2:32 PM

## 2014-04-15 NOTE — Progress Notes (Signed)
Physical Therapy Session Note  Patient Details  Name: Erin [name] MRN: [id number] Date of Birth: Jul 25, 1929  Today's Date: 04/15/2014 PT Co-Treatment Time: 1300-1330 (Co-treat with OT 1300-1400) PT Co-Treatment Time Calculation (min): 30 min  Skilled Therapeutic Interventions/Progress Updates:   Pt received sitting w/c, agreeable to therapy. Skilled co-treatment with OT to address sitting and standing balance, standing tolerance, and transfers. Pt performed lateral scoot transfer w/c <> mat table with max A and performed scooting on mat with mod A. Pt transferred sit <> stand from mat with EVA walker with +2 max assist to stand with L knee stabilized and max multimodal cues for weight shifting to R in standing. Patient completed 2 standing trials of approx 1-3 min each. In sitting with LUE weightbearing, patient performed dynamic sitting tasks with reaching for objects across midline and outside of BOS with RUE. Patient able to perform effective weight shift to R in sitting when reaching with RUE. Pt transferred sit <> supine with mod A on mat. Patient performed bridging with LLE stabilized, 2 x 5. Patient performed assisted LLE heel slides to improve participation with rolling for bed mobility. Patient with increased c/o L anterior hip pain with hip flexion or when put on stretch, unrated. Patient returned to room and left sitting in w/c with pillows placed for improved positioning and call bell in reach.   Therapy Documentation Precautions:  Precautions Precautions: Fall Precaution Comments: subluxation of LT UE noted; per family and patient, muscle wasting at LUE d/t prior injury (fall w/fracture) Restrictions Weight Bearing Restrictions: Yes LLE Weight Bearing: Weight bearing as tolerated Vital Signs: Therapy Vitals Temp: 98.3 F (36.8 C) Temp Source: Oral Pulse Rate: 96 Resp: 18 BP: 115/58 mmHg Patient Position (if appropriate): Sitting Oxygen Therapy SpO2: 95 % Pain: Pain  Assessment Pain Assessment: 0-10 Pain Score: 6  Pain Type: Acute pain Pain Location: Back Pain Orientation: Lower Pain Descriptors / Indicators: Aching Pain Frequency: Intermittent Pain Onset: On-going Pain Intervention(s): Medication (See eMAR)  See FIM for current functional status  Therapy/Group: Co-Treatment  Carney Living A 04/15/2014, 3:35 PM

## 2014-04-15 NOTE — Progress Notes (Signed)
Speech Language Pathology Daily Session Note  Patient Details  Name: Erin [name] MRN: [id number] Date of Birth: 02/28/1930  Today's Date: 04/15/2014 SLP Individual Time: 1105-1205 SLP Individual Time Calculation (min): 60 min  Short Term Goals: Week 3: SLP Short Term Goal 1 (Week 3): Pt will demonstrate effectient mastication of Dys 2 texture trilas with Mod assist  SLP Short Term Goal 2 (Week 3): Pt will complete oral motor and pharyngeal strengthening exercises with min assist.  SLP Short Term Goal 3 (Week 3): Pt will use external aids to facilitate improved recall of daily information with Min assist SLP Short Term Goal 4 (Week 3): Pt will scan to left during funcitonal tasks with Min assist  SLP Short Term Goal 5 (Week 3): Pt will request help as needed with Mod question cues   Skilled Therapeutic Interventions: Skilled treatment session focused on addressing dysphagia and cognition goals. SLP facilitated session with Max assist environmental and verbal cues for arousal.  Once aroused patient required Mod faded to Min verbal cues to sequence basic self-care tasks at the sink.  SLP also facilitated session with skilled observation of lunch of Dys 1 and Dys 2 textures with nectar-thick liquids via cup with Mod faded to Min cues to utilize cough following cup sips.  Patient demonstrated improved timeliness of mastication as a result recommend trial tray of advanced Dys 2 textures with SLP tomorrow.       FIM:  Comprehension Comprehension Mode: Auditory Comprehension: 5-Follows basic conversation/direction: With no assist Expression Expression Mode: Verbal Expression: 5-Expresses complex 90% of the time/cues < 10% of the time Social Interaction Social Interaction: 5-Interacts appropriately 90% of the time - Needs monitoring or encouragement for participation or interaction. Problem Solving Problem Solving: 3-Solves basic 50 - 74% of the time/requires cueing 25 - 49% of the  time Memory Memory: 3-Recognizes or recalls 50 - 74% of the time/requires cueing 25 - 49% of the time FIM - Eating Eating Activity: 1: Helper feeds patient;4: Help with managing cup/glass  Pain Pain Assessment Pain Assessment: No/denies pain  Therapy/Group: Individual Therapy  Carmelia Roller., [place] [phone]  [place] 04/15/2014, 12:43 PM

## 2014-04-15 NOTE — Progress Notes (Signed)
Physical Therapy Session Note  Patient Details  Name: Erin [name] MRN: [id number] Date of Birth: [birth year]  Today's Date: 04/15/2014 PT Individual Time: 0900-1000 PT Individual Time Calculation (min): 60 min   Short Term Goals: Week 2:  PT Short Term Goal 1 (Week 2): Pt will perform bilat rolling with bed rails and Mod A of single therapist. PT Short Term Goal 1 - Progress (Week 2): Progressing toward goal PT Short Term Goal 2 (Week 2): Pt will perform supine<>sit with Mod A of single therapist. PT Short Term Goal 2 - Progress (Week 2): Progressing toward goal PT Short Term Goal 3 (Week 2): Pt will transfer from bed<>w/c with max A of single therapist. PT Short Term Goal 3 - Progress (Week 2): Met PT Short Term Goal 4 (Week 2): Pt will perform static sitting balance x60 seconds with S of single therapist. PT Short Term Goal 4 - Progress (Week 2): Met Week 3:  PT Short Term Goal 1 (Week 3): Continue to work towards [employer]  Skilled Therapeutic Interventions/Progress Updates:   Pt received in bed, just finished with breakfast.  Pt placed in supine and performed pants donning with pt assisting 50-60% with lifting and flexing each LE and performing 2-3 bridges in bed with min-mod A to pull pants up over hips.  Performed rolling to R side with min A and mod verbal cues for full upper trunk rotation and side > sit with min A with assist for full hip flexion to allow pelvic depression.  Pt performed multiple squat pivots and squat scoots to bed > w/c > mat with max A of one person with verbal cues to maintain upright trunk and thoracic extension during anterior lean to bring COG over BOS with extra time to allow pt to initiate lifting hips off surface prior to pivoting.  Once on mat performed NMR; see below for details.  Pt returned to room and tilted for pressure relief; left with all items within reach.  Therapy Documentation Precautions:  Precautions Precautions: Fall Precaution Comments:  subluxation of LT UE noted; per family and patient, muscle wasting at LUE d/t prior injury (fall w/fracture) Restrictions Weight Bearing Restrictions: Yes LLE Weight Bearing: Weight bearing as tolerated Pain: Pain Assessment Pain Assessment: No/denies pain Pain Score: Asleep Locomotion : Ambulation Ambulation/Gait Assistance: 1: +2 Total assist  Other Treatments: Treatments Neuromuscular Facilitation: Right;Left;Lower Extremity;Upper Extremity;Forced use;Activity to increase coordination;Activity to increase motor control;Activity to increase timing and sequencing;Activity to increase grading;Activity to increase sustained activation;Activity to increase lateral weight shifting;Activity to increase anterior-posterior weight shifting with focus on trunk control, anterior and lateral weight shifting to R with lateral trunk elongation and LLE activation/extension to reach and play game of checkers to R side in sitting and then in standing; also performed anterior weight shifting and activation through L trunk and LUE to move checker pieces x 2.  Performed gait x 10' with +2 A under each UE with focus on maintaining upright trunk posture and gaze with max-total verbal and tactile cues for pt to initiate lateral and anterior weight shifting, LLE advancement and assistance for weight acceptance and stabilization through LLE during stance.   See FIM for current functional status  Therapy/Group: Individual Therapy  Erin [name] 04/15/2014, 10:21 AM

## 2014-04-16 ENCOUNTER — Inpatient Hospital Stay (HOSPITAL_COMMUNITY): Payer: Medicare Other | Admitting: Speech Pathology

## 2014-04-16 ENCOUNTER — Inpatient Hospital Stay (HOSPITAL_COMMUNITY): Payer: Medicare Other | Admitting: Occupational Therapy

## 2014-04-16 ENCOUNTER — Inpatient Hospital Stay (HOSPITAL_COMMUNITY): Payer: Medicare Other | Admitting: Physical Therapy

## 2014-04-16 DIAGNOSIS — G8110 Spastic hemiplegia affecting unspecified side: Secondary | ICD-10-CM

## 2014-04-16 MED ORDER — SODIUM CHLORIDE 0.9 % IJ SOLN
10.0000 mL | INTRAMUSCULAR | Status: DC | PRN
  Administered 2014-04-16 – 2014-04-17 (×3): 10 mL

## 2014-04-16 NOTE — Progress Notes (Signed)
Occupational Therapy Session Note  Patient Details  Name: [place] PAONE MRN: [id number] Date of Birth: 1930/03/28  Today's Date: 04/16/2014 OT Individual Time: 1300-1400 OT Individual Time Calculation (min): 60 min   Short Term Goals: Week 3:  OT Short Term Goal 1 (Week 3): continue to work towards [employer]  Skilled Therapeutic Interventions/Progress Updates:  Patient seated in w/c with NT assisting patient with lunch.  NT stated that patient kept falling asleep during lunch and difficult to get food out of the left side of her mouth.  Engaged in self care retraining to include sponge bath at sink, dress and groom.  Patient required mod-max multi modal cues related to staying awake, alert and eyes open, attention to and functional use of LUE, sustained attention, safety awareness, sitting balance and standing balance and tolerance, visual attention to left, postural control and midline orientation.  Patient internally and externally distracted and when standing with +2, patient constantly saying,"don't drop me" all while pushing heavily to the left.  When given cues of any kind, patient can generally self correct however unable to sustain the correction longer than few seconds.  While seated in w/c, patient would often lean forward and far left then c/o pain in LLE requiring cues to lean back to to the right which would always relieve the LLE pain.  Therapy Documentation Precautions:  Precautions Precautions: Fall Precaution Comments: subluxation of LT UE noted; per family and patient, muscle wasting at LUE d/t prior injury (fall w/fracture) Restrictions Weight Bearing Restrictions: Yes LLE Weight Bearing: Weight bearing as tolerated Pain: Lower back pain, not rated yet resolved with repositioning. ADL: See FIM for current functional status  Therapy/Group: Individual Therapy  Fancy Dunkley 04/16/2014, 8:16 AM

## 2014-04-16 NOTE — Progress Notes (Signed)
Speech Language Pathology Discharge Summary  Patient Details  Name: Erin [name] MRN: [id number] Date of Birth: September 06, 1929  Today's Date: 04/16/2014 SLP Individual Time: 0830-0850 SLP Individual Time Calculation (min): 20 min   Skilled Therapeutic Interventions: Skilled treatment session focused on addressing dysphagia goals. SLP facilitated session with set-up assist of breakfast tray and Min verbal cues to consistently recall use of safe swallow strategy following nectar-thick cup sips.  SLP also facilitated session with skilled observation of Dys 1 textures with Min cues to compensate for left-sided anterior loss.  Despite Dys 2 textures not being consumed during session SLP recommends to continue with advanced textures with continued full staff supervision to monitor pocketing.  Of note, session was ended early due to a sterile procedure needing to take place prior to discharge tomorrow; as a result, patient missed 40 minutes of skilled therapy.  Patient has met 5 of 6 long term goals.  Patient to discharge at overall Min;Mod level.  Reasons goals not met: patient continues to require Mod assist with basic problem solving   Clinical Impression/Discharge Summary: Patient has made functional gains and has met 5 out of 6 long term goals during this [facility] stay due to improved swallow function, recall and carryover of safe swallow strategies with Dys 2 textures and nectar-thick liquids as well as improved selective attention for short periods of time and vocal intensity.  Currently, patient continues to require Min-Mod assist with seated cognitive-linguistc tasks.  Patient and family education is ongoing.  Patient would continue to benefit from continued skilled SLP intervention at the next level of care to further reduce overall burend of care by maximizing her functional independence.    Care Partner:  Caregiver Able to Provide Assistance: No  Type of Caregiver  Assistance: Cognitive;Physical  Recommendation:  24 hour supervision/assistance;Skilled Nursing facility  Rationale for SLP Follow Up: Maximize cognitive function and independence;Maximize swallowing safety;Maximize functional communication;Reduce caregiver burden   Equipment: none   Reasons for discharge: Discharged from hospital   Patient/Family Agrees with Progress Made and Goals Achieved: Yes   See FIM for current functional status  Carmelia Roller., CCC-SLP [phone]  [place] 04/16/2014, 9:14 AM

## 2014-04-16 NOTE — Progress Notes (Signed)
Came to visit patient at bedside. She is active with [facility] services. Discussed how the plan is for her to discharge to [facility] tomorrow. Support and encouragement offered. Appreciative of visit.  Marthenia Rolling, MSN-RN,BSN- [facility] [phone]

## 2014-04-16 NOTE — Progress Notes (Signed)
Peripherally Inserted Central Catheter/Midline Placement  The IV Nurse has discussed with the patient and/or persons authorized to consent for the patient, the purpose of this procedure and the potential benefits and risks involved with this procedure.  The benefits include less needle sticks, lab draws from the catheter and patient may be discharged home with the catheter.  Risks include, but not limited to, infection, bleeding, blood clot (thrombus formation), and puncture of an artery; nerve damage and irregular heat beat.  Alternatives to this procedure were also discussed.  PICC/Midline Placement Documentation        Erin [name] 04/16/2014, 9:50 AM Consent obtained yesterday via phone from son.

## 2014-04-16 NOTE — Discharge Summary (Signed)
Discharge summary job [id number]

## 2014-04-16 NOTE — Discharge Summary (Signed)
[name], [name] NO.:  [id number]  MEDICAL RECORD NO.:  [id number]  LOCATION:  1D40C                        FACILITY:  [facility]  PHYSICIAN:  Charlett Blake, M.D.DATE OF BIRTH:  05-21-1930  DATE OF ADMISSION:  03/28/2014 DATE OF DISCHARGE:                              DISCHARGE SUMMARY   DISCHARGE DIAGNOSES: 1. Functional deficits secondary to embolic infarction as well as left     intertrochanteric hip fracture with open reduction internal     fixation March 25, 2014. 2. Subcutaneous heparin for deep vein thrombosis prophylaxis with     venous Doppler studies negative. 3. Pain management. 4. Dysphagia 5. Hypertension with atrial fibrillation. 6. Hyperlipidemia. 7. Gastroesophageal reflux disease.  HISTORY OF PRESENT ILLNESS:  This is an 80 year old right-handed female with history of questionable Parkinson's disease followed by Dr. Jannifer Franklin of [facility]rology Services with history of frequent falls, atrial fibrillation on no anticoagulation except aspirin due to falls.  Lives with family.  Used occasional cane and walker when needed prior to admission and still driving.  Presented March 20, 2014, after a mechanical fall when she lost her balance landing on her left hip without loss of consciousness and was admitted to [facility] and imaging revealed a left intertrochanteric hip fracture.  The patient noted to be confused with some left-sided weakness.  Cranial CT scan showed a right parietal lobe distribution of right MCA artery infarct.  CTA head and neck consistent with acute infarct right basal ganglia and right parietal white matter.  High-grade stenosis versus occlusion of distal right M1 segment.  Neurology consulted.  The patient did not receive tPA.  Echocardiogram with ejection fraction of 60% and no wall motion abnormalities.  Venous Doppler studies of lower extremities negative for DVT.  Preoperative clearance per  Cardiology Services.  Underwent ORIF of intertrochanteric hip fracture March 25, 2013, per Dr. Erlinda Hong.  Weightbearing as tolerated in the left lower extremity.  Placed on subcutaneous heparin for DVT prophylaxis.  Eliquis for CVA prophylaxis.  Dysphagia 1 honey thick liquid diet.  Physical and occupational therapy ongoing.  The patient was admitted for a comprehensive rehab program.  PAST MEDICAL HISTORY:  See discharge diagnoses.  SOCIAL HISTORY:  Lives with family.  FUNCTIONAL HISTORY:  Prior to admission used a cane and a walker at times.  FUNCTIONAL STATUS:  Upon admission to rehab services was +2 physical assist for overall bed mobility, +2 physical assist sit to stand, mod max assist activities of daily living.  PHYSICAL EXAMINATION:  VITAL SIGNS:  Blood pressure 123/48, pulse 102, temperature 98.5, respirations 18. GENERAL:  This was an alert female, speech was dysarthric.  Left central VII and tongue deviation.  Pleasantly confused.  She was not able to provide her name, follows simple commands. SKIN:  Hip incision clean and dry. LUNGS:  Clear to auscultation. CARDIAC:  Rate controlled. ABDOMEN:  Soft, nontender.  Good bowel sounds.  REHABILITATION HOSPITAL COURSE:  The patient was admitted to inpatient rehab services with therapies initiated on a 3-hour daily basis consisting of physical therapy, occupational therapy, speech therapy, and rehabilitation nursing.  The following issues were addressed during the patient's rehabilitation stay.  Pertaining  to Ms. Zuver functional deficits secondary to embolic right parietal basal ganglia infarct remained stable.  She was maintained on Eliquis per Neurology Services.  The patient had also sustained a left intertrochanteric hip fracture with ORIF March 25, 2014, weightbearing as tolerated. Neurovascular sensation intact.  She would follow up with Orthopedics Services, Dr. Erlinda Hong.  She had initially been on subcutaneous  heparin for DVT prophylaxis.  Venous Doppler studies negative.  Heparin was discontinued after Eliquis initiated.  She was using oxycodone on a limited basis for pain.  Her diet was slowly advanced to a dysphagia 1 nectar thick liquid and close monitoring of hydration.  A midline catheter was placed for necessary hydration.  Her blood pressures remained well controlled.  No chest pain or shortness of breath.  She was maintained on Cipro for a E. coli urinary tract infection of 35,000. She remained afebrile.  The patient received weekly collaborative interdisciplinary team conferences to discuss estimated length of stay, family teaching, and any barriers to discharge.  The patient performed a lateral scoot transfers, wheelchair to mat with max assist, performed scooting on mat with moderate assist.  Transferred sit-to-stand from mat with a walker with +2 max assist to stand.  Performed squat pivot scoot transfers, wheelchair to therapy mat with max assist.  The patient initially engaged in static standing tasks with a walker.  Required max multimodal cues to facilitate weight shift.  Due to limited advances and limited social support at home, it was felt skilled nursing facility was needed with bed becoming available April 17, 2014.  DISCHARGE MEDICATIONS:  At the time of dictation included; 1. Eliquis 5 mg p.o. b.i.d. 2. Lipitor 40 mg p.o. daily. 3. Cipro 250 mg p.o. b.i.d. x2 days and stop. 4. Nexium 20 mg p.o. b.i.d. 5. Lopressor 25 mg p.o. t.i.d. 6. Oxycodone immediate release 5-10 mg every 4 hours as needed for     pain. 7. Florastor 250 mg p.o. b.i.d.  Her diet was a dysphagia 1 nectar thick liquid as well as half-normal saline IV fluids 75 mL 7 p.m. to 7 a.m. daily for hydration.  SPECIAL INSTRUCTIONS:  The patient would follow up Dr. Erlinda Hong of Neurology Services, Dr. Eduard Roux [facility], Dr. Tula Nakayama medical management, Dr. Alysia Penna at the [facility] as advised.  The patient medically stable at the time of discharge.     Lauraine Rinne, P.A.   ______________________________ Charlett Blake, M.D.    DA/MEDQ  D:  04/16/2014  T:  04/16/2014  Job:  [id number]  cc:   Charlett Blake, M.D. Norwood Levo. Moshe Cipro, M.D.

## 2014-04-16 NOTE — Progress Notes (Signed)
Social Work Patient ID: Erin [name], female   DOB: 1930/05/22, 80 y.o.   MRN: [id number] Spoke with Anola Gurney regarding bed offer for tomorrow.  Have left message for Dale-son to contact Levada Dy regarding completing paperwork. Pt aware of the plan and is agreeable.  Pt is having mid-line placed today, will be ready tomorrow, according to MD.  Nevin Bloodgood paperwork and plan for tomorrow transfer.

## 2014-04-16 NOTE — Plan of Care (Signed)
Problem: RH SKIN INTEGRITY Goal: RH STG SKIN FREE OF INFECTION/BREAKDOWN Skin will be free of infection and no skin breakdown at discharge .  Outcome: Not Progressing Stage II heel, sacrum

## 2014-04-16 NOTE — Progress Notes (Signed)
Spoke with primary RN, Whitney regarding PICC insertion.   The son has given consent for PICC but wants [place] to wait until he has spoken with other family members.  We are waiting for his ok before preceding with PICC insertion.  RN to let us know.

## 2014-04-16 NOTE — Progress Notes (Signed)
80 y.o. right-handed female with history of question Parkinson's disease followed by Dr. Jannifer Franklin, frequent falls, atrial fibrillation on no anti-coagulation except aspirin due to frequent falls. Patient lives with family use and occasional cane walker when needed and was still driving. Presented 03/20/2014 after mechanical fall when she lost her balance landing on her left hip without loss of consciousness and was admitted to an [facility]. X-rays and imaging revealed a left intertrochanteric hip fracture. Patient noted to be confused with some left-sided weakness. Cranial CT scan showed right parietal lobe distribution of the right middle cerebral artery consistent with acute to subacute ischemia. CTA head and neck consistent with acute infarct right basal ganglia right parietal white matter. High-grade stenosis versus occlusion of distal right M1 segment. Neurology consulted. Patient did not receive TPA. Echocardiogram with ejection fraction of 60% no wall motion abnormalities. Venous Doppler studies lower extremities negative for DVT.  Subjective/Complaints: No pain c/os, up at noc sleeps during the day Review of Systems - limited secondary to communication deficits from CVA  Objective: Vital Signs: Blood pressure 123/56, pulse 98, temperature 98.1 F (36.7 C), temperature source Oral, resp. rate 18, weight 78.7 kg (173 lb 8 oz), SpO2 96.00%. No results found. Results for orders placed during the hospital encounter of 03/28/14 (from the past 72 hour(s))  BASIC METABOLIC PANEL     Status: Abnormal   Collection Time    04/15/14  5:46 AM      Result Value Ref Range   Sodium 141  137 - 147 mEq/L   Potassium 3.7  3.7 - 5.3 mEq/L   Chloride 106  96 - 112 mEq/L   CO2 26  19 - 32 mEq/L   Glucose, Bld 111 (*) 70 - 99 mg/dL   BUN 6  6 - 23 mg/dL   Creatinine, Ser 0.48 (*) 0.50 - 1.10 mg/dL   Calcium 8.2 (*) 8.4 - 10.5 mg/dL   GFR calc non Af Amer 89 (*) >90 mL/min   GFR calc Af Amer >90  >90 mL/min   Comment: (NOTE)     The eGFR has been calculated using the CKD EPI equation.     This calculation has not been validated in all clinical situations.     eGFR's persistently <90 mL/min signify possible Chronic Kidney     Disease.   Anion gap 9  5 - 15      Physical Exam  obese Eyes:  Pupils reactive to light  Neck: Neck supple. No thyromegaly present.  Cardiovascular:  Cardiac rate controlled  Respiratory: Breath sounds normal. No respiratory distress.  GI: Soft. Bowel sounds are normal. She exhibits no distension.  Neurological:  Speech is dysarthric. Left central 7 and tongue deviation. Pleasantly confused  . Followed simple commands. Limited awareness and insight. She does have a right gaze preference. LUE is tr to 2/5 deltoid, bicep, tricep 1/5 Grip. LLE limited by pain. 1+ HF, 1 KE and ADF.APF. No withdrawal to pinch in Left hand Normal strength on the right side  Skin:  Her hip incision is not tender,     Assessment/Plan: 1. Functional deficits secondary to left hemiparesis from right CVA as well as left intertrochanteric hip fracture status post ORIF 03/25/2014 which require 3+ hours per day of interdisciplinary therapy in a comprehensive inpatient rehab setting. Physiatrist is providing close team supervision and 24 hour management of active medical problems listed below. Physiatrist and rehab team continue to assess barriers to discharge/monitor patient progress toward functional and medical goals.  FIM: FIM - Bathing Bathing Steps Patient Completed: Chest;Left Arm;Abdomen;Front perineal area Bathing: 2: Max-Patient completes 3-4 73f10 parts or 25-49%  FIM - Upper Body Dressing/Undressing Upper body dressing/undressing steps patient completed: Thread/unthread right sleeve of pullover shirt/dresss;Thread/unthread left sleeve of pullover shirt/dress Upper body dressing/undressing: 3: Mod-Patient completed 50-74% of tasks FIM - Lower Body Dressing/Undressing Lower body  dressing/undressing: 1: Two helpers  FIM - TMusicianDevices: Grab bar or rail for support Toileting: 1: Two helpers  FIM - TRadio producerDevices: BRecruitment consultantTransfers: 1-Two helpers  FIM - BControl and instrumentation engineerDevices: Arm rests Bed/Chair Transfer: 2: Bed > Chair or W/C: Max A (lift and lower assist);2: Chair or W/C > Bed: Max A (lift and lower assist);3: Supine > Sit: Mod A (lifting assist/Pt. 50-74%/lift 2 legs;3: Sit > Supine: Mod A (lifting assist/Pt. 50-74%/lift 2 legs)  FIM - Locomotion: Wheelchair Distance: 20 Locomotion: Wheelchair: 1: Total Assistance/staff pushes wheelchair (Pt<25%) FIM - Locomotion: Ambulation Locomotion: Ambulation Assistive Devices: Other (comment) (one person positioned under RUE, other at L side) Ambulation/Gait Assistance: 1: +2 Total assist Locomotion: Ambulation: 0: Activity did not occur  Comprehension Comprehension Mode: Auditory Comprehension: 5-Follows basic conversation/direction: With no assist  Expression Expression Mode: Verbal Expression: 5-Expresses complex 90% of the time/cues < 10% of the time  Social Interaction Social Interaction: 5-Interacts appropriately 90% of the time - Needs monitoring or encouragement for participation or interaction.  Problem Solving Problem Solving Mode: Asleep Problem Solving: 3-Solves basic 50 - 74% of the time/requires cueing 25 - 49% of the time  Memory Memory Mode: Asleep Memory: 3-Recognizes or recalls 50 - 74% of the time/requires cueing 25 - 49% of the time Medical Problem List and Plan:  1. Functional deficits secondary to an embolic right parietal basal ganglia infarcts as well as left intertrochanteric hip fracture. Status post ORIF 03/25/2014. Weightbearing as tolerated , poor awareness of def, appreciate Ortho f/u 2. DVT Prophylaxis/Anticoagulation: Subcutaneous heparin. Monitor platelet counts any  signs of bleeding. Venous Doppler studies negative  3. Pain Management: Oxycodone as needed. Monitor with increased mobility  4. Dysphagia. Dysphagia 1 honey thick liquids.   -hs ivf .    -replace potassium  -add megace to improve intake  -recheck labs monday 5. Neuropsych: This patient is not capable of making decisions on her own behalf.  6. Skin/Wound Care: Routine skin checks  7. Hypertension/atrial fibrillation. Lopressor 25 mg 3 times a day. Cardiac rate control, Started on Eliquis per neuro Dr XErlinda Hong no sign of bleeding 8. Hyperlipidemia. Lipitor  9. GERD. Protonix 10.  Bladder: repeat ucx again with ecoli (only 35k)  -cipro for 5 days  -timed toileting 11. Bowel incontinence---stopped senna-s  -added florastor bid  -stool appeared more formed yesterday   LOS (Days) 19 A FACE TO FACE EVALUATION WAS PERFORMED  Erin [name] 04/16/2014, 8:33 AM

## 2014-04-16 NOTE — Progress Notes (Signed)
Physical Therapy Session Note  Patient Details  Name: Erin [name] MRN: [id number] Date of Birth: 06/09/1930  Today's Date: 04/16/2014 PT Individual Time: 1000-1100 PT Individual Time Calculation (min): 60 min   Short Term Goals: Week 3:  PT Short Term Goal 1 (Week 3): Continue to work towards [employer]  Skilled Therapeutic Interventions/Progress Updates:   Pt received semi reclined in bed, reporting she did not get to eat breakfast. Pt transferred supine > sit with HOB flat and no rail, mod A. Patient performed lateral scoot transfer bed <> w/c and w/c <> mat table with max A. Due to patient needing to eat breakfast, skilled observation of Dys 2 textures with nectar-thick liquids via cup with min cues to utilize cough. Patient transported to therapy gym. Pt transferred sit <> stand from mat with EVA walker with +2 max assist to stand with L knee stabilized and max multimodal cues for weight shifting to R in standing. Pt returned to room and left sitting in w/c at sink with rehab tech to brush teeth.   Therapy Documentation Precautions:  Precautions Precautions: Fall Precaution Comments: subluxation of LT UE noted; per family and patient, muscle wasting at LUE d/t prior injury (fall w/fracture) Restrictions Weight Bearing Restrictions: Yes LLE Weight Bearing: Weight bearing as tolerated Pain: Pain Assessment Pain Assessment: 0-10 Pain Score: 7  Pain Type: Acute pain Pain Location: Back Pain Orientation: Lower Pain Descriptors / Indicators: Aching Pain Frequency: Intermittent Pain Onset: On-going Patients Stated Pain Goal: 3 Pain Intervention(s): Medication (See eMAR)  See FIM for current functional status  Therapy/Group: Individual Therapy  Laretta Alstrom 04/16/2014, 12:08 PM

## 2014-04-17 ENCOUNTER — Inpatient Hospital Stay
Admission: RE | Admit: 2014-04-17 | Discharge: 2014-06-26 | Disposition: A | Discharge status: Home / Self Care | Payer: Medicare PPO | Source: Ambulatory Visit | Attending: Internal Medicine | Admitting: Internal Medicine

## 2014-04-17 DIAGNOSIS — R82998 Other abnormal findings in urine: Principal | ICD-10-CM

## 2014-04-17 DIAGNOSIS — R52 Pain, unspecified: Secondary | ICD-10-CM

## 2014-04-17 DIAGNOSIS — R238 Other skin changes: Secondary | ICD-10-CM

## 2014-04-17 DIAGNOSIS — T17920A Food in respiratory tract, part unspecified causing asphyxiation, initial encounter: Secondary | ICD-10-CM

## 2014-04-17 DIAGNOSIS — T17928A Food in respiratory tract, part unspecified causing other injury, initial encounter: Secondary | ICD-10-CM

## 2014-04-17 MED ORDER — HEPARIN SOD (PORK) LOCK FLUSH 100 UNIT/ML IV SOLN
250.0000 [IU] | INTRAVENOUS | Status: AC | PRN
  Administered 2014-04-17: 250 [IU]

## 2014-04-17 NOTE — Progress Notes (Signed)
During report, nurse at [facility] asked me to give patient a laxative prior to discharge because today is her 3rd day without a BM.  I told nurse that I didn't think she would want me to give one because she could have to go when in the ambulance.  The nurse told me she would rather her go and have to clean her up once she gets to the [facility] rather than not go.  I offered laxative to patient, but she refused because she didn't want to have a BM in the ambulance.  Notified Marlowe Shores, PA of incident in which he agrees with treatment plan.  Brita Romp, RN

## 2014-04-17 NOTE — Plan of Care (Signed)
Problem: RH Balance Goal: LTG: Patient will maintain dynamic sitting balance (OT) LTG: Patient will maintain dynamic sitting balance with assistance during activities of daily living (OT)  Outcome: Not Met (add Reason) Pt requires S- Max A for dynamic sitting balance. Goal: LTG Patient will maintain dynamic standing with ADLs (OT) LTG: Patient will maintain dynamic standing balance with assist during activities of daily living (OT)  Outcome: Not Met (add Reason) Pt requires +2 assist or dynamic standing balance activities.   Problem: RH Bathing Goal: LTG Patient will bathe with assist, cues/equipment (OT) LTG: Patient will bathe specified number of body parts with assist with/without cues using equipment (position) (OT)  Outcome: Not Met (add Reason) Pt required max A for bathing  Problem: RH Dressing Goal: LTG Patient will perform upper body dressing (OT) LTG Patient will perform upper body dressing with assist, with/without cues (OT).  Outcome: Not Met (add Reason) Pt required additional assistance for dressing Goal: LTG Patient will perform lower body dressing w/assist (OT) LTG: Patient will perform lower body dressing with assist, with/without cues in positioning using equipment (OT)  Outcome: Not Met (add Reason) Secondary to increased pain and decreased dynamic standing of +2 assist  Problem: RH Toileting Goal: LTG Patient will perform toileting w/assist, cues/equip (OT) LTG: Patient will perform toiletiing (clothes management/hygiene) with assist, with/without cues using equipment (OT)  Outcome: Not Met (add Reason) Pt requires +2 assist for toileting secondary to clothing management  Problem: RH Toilet Transfers Goal: LTG Patient will perform toilet transfers w/assist (OT) LTG: Patient will perform toilet transfers with assist, with/without cues using equipment (OT)  Outcome: Not Met (add Reason) Pt requires + 2 assist for safety  Problem: RH Tub/Shower Transfers Goal:  LTG Patient will perform tub/shower transfers w/assist (OT) LTG: Patient will perform tub/shower transfers with assist, with/without cues using equipment (OT)  Outcome: Not Met (add Reason) Pt did not perform shower transfers secondary to declining and +2 assist

## 2014-04-17 NOTE — Progress Notes (Signed)
Social Work Discharge Note Discharge Note  The overall goal for the admission was met for:   Discharge location: No-[facility]-SNF  Length of Stay: Yes-20 DAYS  Discharge activity level: Yes-MOD/MAX LEVEL  Home/community participation: Yes  Services provided included: MD, RD, PT, OT, SLP, RN, CM, TR, Pharmacy and SW  Financial Services: Medicare and Private Insurance: [place]  Follow-up services arranged: Other: NHP  Comments (or additional information):FAILY HOPES WITH MORE THERAPY AND TIME PT WILL BE ABLE TO RETURN HOME. FAMILY TO MEET PT AT [facility]  Patient/Family verbalized understanding of follow-up arrangements: Yes  Individual responsible for coordination of the follow-up plan: DALE-SON  Confirmed correct DME delivered: Elease Hashimoto 04/17/2014    Elease Hashimoto

## 2014-04-17 NOTE — Plan of Care (Signed)
Problem: RH Balance Goal: LTG Patient will maintain dynamic sitting balance (PT) LTG: Patient will maintain dynamic sitting balance with assistance during mobility activities (PT)  Outcome: Not Met (add Reason) Pt continued to require S to mod/max A for sitting balance due to poor activity tolerance and posterior and L lateral lean.  Goal: LTG Patient will maintain dynamic standing balance (PT) LTG: Patient will maintain dynamic standing balance with assistance during mobility activities (PT)  Outcome: Not Met (add Reason) Pt requires max A for static standing balance at this time and would recommend +2 for dynamic for safety.   Problem: RH Bed to Chair Transfers Goal: LTG Patient will perform bed/chair transfers w/assist (PT) LTG: Patient will perform bed/chair transfers with assistance, with/without cues (PT).  Outcome: Not Met (add Reason) Pt requires max A for transfers at this time due to decreased strength and decreased forward weight shift.   Problem: RH Wheelchair Mobility Goal: LTG Patient will propel w/c in controlled environment (PT) LTG: Patient will propel wheelchair in controlled environment, # of feet with assist (PT)  Outcome: Not Met (add Reason) Pt unable to propel w/c due to being in tilt in space, did not switch her to regular w/c during her stay due to poor trunk control.

## 2014-04-17 NOTE — Progress Notes (Signed)
Patient discharged to [facility] via ambulance, escorted by EMS staff.  All patient belongings sent with patient.  Report previously called to [facility].  Patient appears to be in no immediate distress at this time.  Brita Romp, RN

## 2014-04-17 NOTE — Progress Notes (Signed)
Called report to [facility].  Patient scheduled to leave around 1030.  Pt aware of transfer, bath being given now.  Awaiting transport.  Brita Romp, RN

## 2014-04-17 NOTE — Progress Notes (Signed)
Occupational Therapy Discharge Summary  Patient Details  Name: Erin [name] MRN: [id number] Date of Birth: 10-11-1929   Patient has met 2 of 11 long term goals due to improved balance and postural control.  Patient to discharge at [facility] Max Assist level.  Patient discharged to skilled nursing center in order to continue OT intervention.   Reasons goals not met: Pt with increased pain in L hip from ORIF surgery which limits progress. Also, pt continues to require + 2 assist for safety with transfers and standing.   Recommendation:  Patient will benefit from ongoing skilled OT services in skilled nursing facility setting to continue to advance functional skills in the area of BADL.  Equipment: No equipment provided  Reasons for discharge: lack of progress toward goals  Patient/family agrees with progress made and goals achieved: Yes  OT Discharge Precautions/Restrictions  Precautions Precautions: Fall Precaution Comments: subluxation of LT UE noted; per family and patient, muscle wasting at LUE d/t prior injury (fall w/fracture) ADL ADL ADL Comments: see FIM Vision/Perception  Vision- History Patient Visual Report: Eye fatigue/eye pain/headache Vision- Assessment Additional Comments: Pt with L inattention and closing eyes during session and reports "the light bothers me" at times.   Cognition Overall Cognitive Status: Impaired/Different from baseline Arousal/Alertness: Awake/alert Orientation Level: Oriented to person;Oriented to situation;Oriented to place Attention: Selective Selective Attention: Appears intact Memory: Impaired Memory Impairment: Decreased recall of new information Awareness: Impaired Awareness Impairment: Emergent impairment Problem Solving: Impaired Problem Solving Impairment: Functional basic;Verbal complex Safety/Judgment: Appears intact Sensation Sensation Light Touch: Appears Intact Stereognosis: Impaired Detail Stereognosis Impaired  Details: Impaired LLE;Impaired LUE Hot/Cold: Impaired Detail Hot/Cold Impaired Details: Impaired LUE Proprioception: Impaired Detail Proprioception Impaired Details: Impaired LLE;Impaired LUE Coordination Gross Motor Movements are Fluid and Coordinated: No Fine Motor Movements are Fluid and Coordinated: No Coordination and Movement Description: Impaired LLE & LUE Motor  Motor Motor: Hemiplegia;Abnormal postural alignment and control Motor - Discharge Observations: L UE hemipegia, LLE limited by pain from ORIF Mobility  Bed Mobility Supine to Sit: 3: Mod assist;HOB flat Sit to Supine: 3: Mod assist;HOB flat  Trunk/Postural Assessment  Cervical Assessment Cervical Assessment: Exceptions to [facility] (forward head) Thoracic Assessment Thoracic Assessment: Exceptions to [facility] (kyphosis) Lumbar Assessment Lumbar Assessment: Exceptions to WFL (kyphotic) Postural Control Postural Control: Deficits on evaluation Head Control: left inattention, head tilt and turn to right Trunk Control: impaired, with hx of kyphosis and posterior pelvic tilt  Balance Balance Balance Assessed: Yes Static Sitting Balance Static Sitting - Balance Support: Feet supported Static Sitting - Level of Assistance: 5: Stand by assistance;3: Mod assist Dynamic Sitting Balance Sitting balance - Comments: S - Max A for dynmaic sitting balance during functional tasks secondary to posterior and L lateral lean Static Standing Balance Static Standing - Balance Support: During functional activity Static Standing - Level of Assistance: 2: Max assist Dynamic Standing Balance Dynamic Standing - Balance Support: During functional activity Dynamic Standing - Level of Assistance: 1: +2 Total assist Extremity/Trunk Assessment RUE Assessment RUE Assessment: Exceptions to [facility] RUE AROM (degrees) Overall AROM Right Upper Extremity: Due to premorbid status RUE Strength RUE Overall Strength: Deficits;Due to premorbid status LUE  Assessment LUE Assessment: Exceptions to [facility] LUE AROM (degrees) Overall AROM Left Upper Extremity: Deficits (L hemiplegia) LUE Strength LUE Overall Strength: Deficits  See FIM for current functional status  Phineas Semen 04/17/2014, 4:31 PM

## 2014-04-17 NOTE — Progress Notes (Signed)
Physical Therapy Discharge Summary  Patient Details  Name: Erin [name] MRN: [id number] Date of Birth: 01/01/1930  Patient has met 4 of 8 long term goals due to improved activity tolerance, improved balance, improved postural control, improved attention and improved awareness.  Patient to discharge at a wheelchair level Max Assist.   Patient's care partner unavailable to provide the necessary physical and cognitive assistance at discharge.  Reasons goals not met: Pt unable to meet sitting balance, standing balance and transfer goals due to poor trunk control, decreased strength, decreased recall of techniques, and decreased functional strength in LLE/LUE and overall generalized weakness.   Recommendation:  Patient will benefit from ongoing skilled PT services in skilled nursing facility setting to continue to advance safe functional mobility, address ongoing impairments in decreased balance, decreased safety, decreased awareness, decreased memory, decreased functional use of LUE/LE, poor postural control, and minimize fall risk.  Equipment: No equipment provided  Reasons for discharge: treatment goals met and discharge from hospital  Patient/family agrees with progress made and goals achieved: Yes  PT Discharge Precautions/Restrictions Precautions Precautions: Fall Precaution Comments: subluxation of LT UE noted; per family and patient, muscle wasting at LUE d/t prior injury (fall w/fracture) Restrictions Weight Bearing Restrictions: Yes LLE Weight Bearing: Weight bearing as tolerated   Vision/Perception   See OT note Cognition Overall Cognitive Status: Impaired/Different from baseline Arousal/Alertness: Awake/alert Orientation Level: Oriented to person;Oriented to situation;Oriented to place Attention: Selective Selective Attention: Appears intact Memory: Impaired Memory Impairment: Decreased recall of new information Awareness: Impaired Awareness Impairment: Emergent  impairment Problem Solving: Impaired Problem Solving Impairment: Functional basic;Verbal complex Safety/Judgment: Appears intact Sensation Sensation Light Touch: Appears Intact Stereognosis: Impaired Detail Stereognosis Impaired Details: Impaired LLE;Impaired LUE Hot/Cold: Appears Intact Proprioception: Impaired Detail Proprioception Impaired Details: Impaired LLE;Impaired LUE Coordination Gross Motor Movements are Fluid and Coordinated: No Fine Motor Movements are Fluid and Coordinated: No Coordination and Movement Description: Impaired LLE & LUE Motor  Motor Motor: Hemiplegia;Abnormal postural alignment and control Motor - Discharge Observations: Left UE hemiplegia distal > proximal, LLE limited by pain due to ORIF  Mobility Bed Mobility Bed Mobility: Supine to Sit;Sit to Supine;Sitting - Scoot to Edge of Bed Supine to Sit: 3: Mod assist;HOB flat Supine to Sit Details: Verbal cues for sequencing;Verbal cues for technique;Tactile cues for weight shifting Sitting - Scoot to Edge of Bed: 2: Max assist Sitting - Scoot to [employer] of Bed Details: Manual facilitation for weight shifting;Manual facilitation for placement;Visual cues/gestures for sequencing Sit to Supine: 3: Mod assist;HOB flat Sit to Supine - Details: Verbal cues for sequencing;Verbal cues for technique;Manual facilitation for weight shifting;Manual facilitation for placement Transfers Transfers: Yes Squat Pivot Transfers: 2: Max assist;With upper extremity assistance Squat Pivot Transfer Details: Verbal cues for technique;Manual facilitation for weight shifting;Manual facilitation for placement Locomotion  Ambulation Ambulation: Yes (per previous sessions) Ambulation/Gait Assistance: 1: +2 Total assist Ambulation Distance (Feet): 27 Feet Assistive device: 2 person hand held assist;Other (Comment) ("three muskateer style") Ambulation/Gait Assistance Details: Verbal cues for sequencing;Verbal cues for technique;Verbal  cues for precautions/safety;Manual facilitation for weight shifting;Verbal cues for gait pattern;Manual facilitation for placement;Manual facilitation for weight bearing Gait Gait: Yes Gait Pattern: Impaired Gait Pattern: Decreased stride length;Trunk flexed;Narrow base of support;Lateral trunk lean to left;Decreased hip/knee flexion - left;Decreased stance time - left Stairs / Additional Locomotion Stairs: No Wheelchair Mobility Wheelchair Mobility: No (pt unable due to tilt in space w/c)  Trunk/Postural Assessment  Cervical Assessment Cervical Assessment: Exceptions to [facility] (premorbid mild head lean/tilt toward right  per family) Thoracic Assessment Thoracic Assessment: Exceptions to [facility] (significant kyphosis; hx of osteoporosis) Lumbar Assessment Lumbar Assessment: Within Functional Limits Postural Control Postural Control: Deficits on evaluation Head Control: left inattention, head tilt and turn to right Trunk Control: impaired, with hx of kyphosis and posterior pelvic tilt  Balance Balance Balance Assessed: Yes (per previous sessions) Static Sitting Balance Static Sitting - Balance Support: Feet supported Static Sitting - Level of Assistance: 5: Stand by assistance;3: Mod assist Dynamic Sitting Balance Sitting balance - Comments: Pt requires S to mod/max A for dynamic sitting balance due to heavy posterior and L lateral lean Static Standing Balance Static Standing - Balance Support: During functional activity Static Standing - Level of Assistance: 2: Max assist Dynamic Standing Balance Dynamic Standing - Balance Support: During functional activity Dynamic Standing - Level of Assistance: 1: +2 Total assist Extremity Assessment      RLE Assessment RLE Assessment: Exceptions to [facility] RLE Strength RLE Overall Strength: Deficits RLE Overall Strength Comments: Grossly 4-/5 to 4/5  LLE Assessment LLE Assessment: Exceptions to [facility] LLE Strength LLE Overall Strength: Deficits;Due  to pain LLE Overall Strength Comments: hip flex approx 2-/5, knee flex approx 2-/5, knee ext approx 2+/5, ankle DF/PF approx 2/5  See FIM for current functional status  Denice Bors 04/17/2014, 12:01 PM

## 2014-04-17 NOTE — Plan of Care (Signed)
Problem: RH SKIN INTEGRITY Goal: RH STG SKIN FREE OF INFECTION/BREAKDOWN Skin will be free of infection and no skin breakdown at discharge .  Outcome: Not Met (add Reason) Multiple pressure ulcers

## 2014-04-18 ENCOUNTER — Ambulatory Visit: Payer: Medicare Other | Admitting: Internal Medicine

## 2014-04-18 ENCOUNTER — Other Ambulatory Visit: Payer: Self-pay | Admitting: *Deleted

## 2014-04-18 MED ORDER — OXYCODONE HCL 5 MG PO TABS: ORAL_TABLET | ORAL | Status: DC

## 2014-04-18 NOTE — Telephone Encounter (Signed)
[facility] 

## 2014-04-22 ENCOUNTER — Non-Acute Institutional Stay (SKILLED_NURSING_FACILITY): Payer: Medicare Other | Admitting: Internal Medicine

## 2014-04-22 DIAGNOSIS — I482 Chronic atrial fibrillation, unspecified: Secondary | ICD-10-CM

## 2014-04-22 DIAGNOSIS — R339 Retention of urine, unspecified: Secondary | ICD-10-CM

## 2014-04-22 DIAGNOSIS — I69359 Hemiplegia and hemiparesis following cerebral infarction affecting unspecified side: Secondary | ICD-10-CM

## 2014-04-22 DIAGNOSIS — IMO0002 Reserved for concepts with insufficient information to code with codable children: Secondary | ICD-10-CM

## 2014-04-22 DIAGNOSIS — G8191 Hemiplegia, unspecified affecting right dominant side: Secondary | ICD-10-CM

## 2014-04-28 NOTE — Progress Notes (Addendum)
Patient ID: KYNZLEY DOWSON, female   DOB: 11-Jan-1930, 80 y.o.   MRN: [id number]               HISTORY & PHYSICAL  DATE:  04/22/2014    FACILITY: [facility]    LEVEL OF CARE:   SNF   CHIEF COMPLAINT:  Admission to SNF, post stay at inpatient rehab, 03/28/2014 through 04/17/2014.  Prior to this, the patient was an inpatient at [facility].    HISTORY OF PRESENT ILLNESS:  This is an 80 year-old lady with a history of questionable Parkinson's disease, followed by Dr. Jannifer Franklin, with frequent falls and atrial fibrillation, on aspirin.   She used a cane and a walker although, in discussing this with her, she probably she did not use much of these at all.    She presented to hospital on September 9th after a mechanical fall when she lost her balance, landing on her left hip.  X-rays revealed a left intertrochanteric hip fracture.  She was noted to have some weakness.  A CT scan showed a right parietal lobe distribution infarct, right middle cerebral artery infarct.  CTA of the head and neck consistent with an acute infarct in the right basal ganglia and right parietal white matter, high-grade stenosis versus occlusion of the distal right M1 segment.  The patient did not receive TPA.  Echocardiogram showed an EF of 60% with no wall motion abnormalities.  Venous dopplers of the lower extremities were negative.  The patient underwent hip fracture repair on September 14th by Dr. Erlinda Hong.  She was discharged on a dysphagia I with honey-thick.    The patient is weightbearing as tolerated.  Heparin was discontinued and Eliquis was initiated.    She was on nectar-thick with close monitoring of hydration.  She was ultimately discharged, requiring saline IV 75 mL from 7p to 7a through a right arm PICC line.  I have not been giving this since her arrival here.  On 04/19/2014:  Her sodium was 139, BUN 12, creatinine 0.62.  We will need to follow her hydration status, but I think we can get the PICC line out  later this week.    The other issue was urinary retention.  She has a Foley catheter now in place.  She does not describe urinary retention at home, although she does describe urinary frequency, up three times a night to void.  She also had urge urinary symptoms.    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    Left hip fracture.    Hypertension.    Atrial fibrillation.  Now on Eliquis.  I am assuming that this is a permanent addition.    Dysphagia secondary to a CVA.  On honey-thick liquids.    Dehydration requiring fluid at night through a PICC line.  She does not currently appear to be dehydrated and I am hopeful to get this removed.  Follow-up lab work in two days.    CURRENT MEDICATIONS:  Medication list is reviewed.    SOCIAL HISTORY:   HOUSING:  She was living at home and she states a son was living with her.   FUNCTIONAL STATUS:  Her exact functional status is unknown.  She is supposed to be using a walker for falls.    REVIEW OF SYSTEMS:   GU:  The patient describes urinary frequency without hesitancy.  It sounds as though there were marked urge urinary symptoms,  if not frank incontinence.   GI:  She apparently is eating and drinking better.    PHYSICAL EXAMINATION:   GENERAL APPEARANCE:  The patient is not in any distress.  Conversational.   CHEST/RESPIRATORY:  Clear air entry bilaterally.   CARDIOVASCULAR:  CARDIAC:   Heart sounds have some extra beats.  I am really not sure if this is atrial fib currently or not.  There are no murmurs.  Her JVP, if anything, is elevated at 45.  There is no S3.   GASTROINTESTINAL:  LIVER/SPLEEN/KIDNEYS:  No liver, no spleen.   ABDOMEN:  No masses.   GENITOURINARY:  BLADDER:   Foley catheter in place.   NEUROLOGICAL:     CRANIAL NERVES:  She has left upper motor neuron 7th, probably a left homonymous hemianopsia.   SENSATION/STRENGTH:  She has weakness, but some movement in  the left arm and left leg.  She also has some weakness in the right leg, both abduction of the hip and flexion.   DEEP TENDON REFLEXES:  Reflexes normal in the upper extremities, 2+ in the left knee jerk, absent at the right.  I think her Babinski response is extensor on the left, equivocal on the right.   OTHER:  She has what appears to be an essential tremor in the right hand, although it is also present at rest.  She has no rigidity.   PSYCHIATRIC:   MENTAL STATUS:   Somewhat depressed when I went in the room.  However, she became more animated and it was easy to carry on a general conversation.  No overt depression is noted.    ASSESSMENT/PLAN:  Right-sided embolic CVA.  Now on Eliquis, as described.    Atrial fibrillation.  Her heart rate is controlled.  She is on Lopressor 25  t.i.d.    Hyperlipidemia.  On Lipitor.  I am not sure if this is primary or secondary prevention  Gastroesophageal reflux disease.  On Nexium.    Urinary retention.  Apparently, the amount of urine obtained during her retention in the hospital was not recorded.  I would like to try to remove the Foley catheter.    Dehydration/minimal oral intake.  So far, she has not really required nighttime IV fluid through her PICC line.  I will repeat her basic metabolic panel on Wednesday.  If this is stable, I think the PICC line can come out.    The patient really has extensive neurologic deficits related to right-sided stroke.  I am uncertain why she is so weak on the right leg, however, as well.  She does have antigravity strength here, but certainly not full strength.    CPT CODE: 42706

## 2014-05-01 ENCOUNTER — Non-Acute Institutional Stay (SKILLED_NURSING_FACILITY): Payer: Medicare Other | Admitting: Internal Medicine

## 2014-05-01 ENCOUNTER — Encounter: Payer: Self-pay | Admitting: Internal Medicine

## 2014-05-01 ENCOUNTER — Telehealth: Payer: Self-pay | Admitting: Neurology

## 2014-05-01 DIAGNOSIS — T148XXA Other injury of unspecified body region, initial encounter: Secondary | ICD-10-CM

## 2014-05-01 DIAGNOSIS — T148 Other injury of unspecified body region: Secondary | ICD-10-CM

## 2014-05-01 DIAGNOSIS — F411 Generalized anxiety disorder: Secondary | ICD-10-CM | POA: Insufficient documentation

## 2014-05-01 DIAGNOSIS — I482 Chronic atrial fibrillation, unspecified: Secondary | ICD-10-CM

## 2014-05-01 DIAGNOSIS — K5900 Constipation, unspecified: Secondary | ICD-10-CM | POA: Insufficient documentation

## 2014-05-01 NOTE — Telephone Encounter (Signed)
Okay for me to see her. I have established her as a patient.

## 2014-05-01 NOTE — Progress Notes (Signed)
Patient ID: Erin [name], female   DOB: 1929/09/02, 80 y.o.   MRN: [id number]   this is an acute visit FACILITY: [facility]: SNF   CHIEF COMPLAINT: Acute visit secondary to multiple issues including constipation-finger abrasion-anxiety .  HISTORY OF PRESENT ILLNESS : This is an 80 year-old lady with a history of questionable Parkinson's disease, followed by Dr. Jannifer Franklin, with frequent falls and atrial fibrillation,   She presented to hospital on September 9th after a mechanical fall when she lost her balance, landing on her left hip. X-rays revealed a left intertrochanteric hip fracture. She was noted to have some weakness. A CT scan showed a right parietal lobe distribution infarct, right middle cerebral artery infarct. CTA of the head and neck consistent with an acute infarct in the right basal ganglia and right parietal white matter, high-grade stenosis versus occlusion of the distal right M1 segment. The patient did not receive TPA. Echocardiogram showed an EF of 60% with no wall motion abnormalities. Venous dopplers of the lower extremities were negative. The patient underwent hip fracture repair on September 14th by Dr. Erlinda Hong. She was discharged on a dysphagia I with honey-thick.  The patient is weightbearing as tolerated. Heparin was discontinued and Eliquis was initiated.  She was on nectar-thick with close monitoring of hydration. She was ultimately discharged, requiring saline IV 75 mL from 7p to 7a through a right arm PICC line. During the hospitalization.  Apparently she is eating and drinking better according to nursing--f recent labs looked fairly unremarkable -with BUN of 18 creatinine 0.54 sodium of 143 on October 14 we'll update this later this week-we will discontinue the PICC line  Today apparently she is complaining of occasional constipation she is receiving oxycodone apparently this gives her relief but I suspect could be leading to motility issues.  She  does not complaining of nausea vomiting or abdominal discomfort.  She also says she has some trouble sleeping at night she feels anxious she gets Tylenol PM says this may help with the pain but not really help with any anxiety-apparently she does have some history of anxiety although is not on any medication for this.  Another issue was a slight right middle finger abrasion apparently this was noted a few days ago she says she hit it on the wheelchair I assessed today did not appear to be infected there was some central crusting appears this is a healing small abrasion but will have to be monitored     .      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  Left hip fracture.  Hypertension.  Atrial fibrillation. Now on Eliquis. I am assuming that this is a permanent addition.  Dysphagia secondary to a CVA. On honey-thick liquids.  Dehydration requiring fluid at night through a PICC line. She does not currently appear to be dehydrated and I am hopeful to get this removed. Follow-up lab work in two days .  CURRENT MEDICATIONS: Medication list is reviewed .  SOCIAL HISTORY:  HOUSING: She was living at home and she states a son was living with her.   [name]  REVIEW OF SYSTEMS: Gen. no complaints of fever or chills.  Skin-again does complain somewhat of a right middle finger abrasion as noted above says it is less tender.  Respiratory does not complain of shortness of breath or cough.  Cardiac history of A. fib but does not complaining of chest  pain.  GI-is not complaining of abdominal pain nausea or vomiting does complain of occasional constipation.  Muscle skeletal is not complaining of joint pain currently.  Neurologic-history of possible Parkinson's disease followed by neurology the patient denies Parkinson's does have tremors which is baseline.  Psych does complain of some anxiety which keeps her up at night.    .    PHYSICAL EXAMINATION Temperature 98.5 pulse 96 respirations 20 blood pressure 141/65-105/88 in this range O2 saturation 97%:  GENERAL APPEARANCE: The patient is not in any distress. Conversational.  Her skin is warm and dry do note on the proximal right middle finger there is a small crusted area surrounded with some very pale erythema this is not really acutely tender to palpation nor warm there is no exudate --appears  at this point be a healing abrasion  PICC line in place on the right upper arm there is no drainage bleeding or sign of infection   CHEST/RESPIRATORY: Clear air entry bilaterally.  CARDIOVASCULAR:  CARDIAC: Irregular irregular rate and rhythm without murmur   Abdomen is soft nontender there are positive bowel sounds  .  Muscle skeletal has significant left-sided weakness and has movement in her left arm and leg .  Neurologic as noted above she does have a tremor of her right upper extremity could not really appreciate rigidity there.  Psych-she is alert and appears grossly oriented says she is anxious fairly frequentlya  labs.  04/24/2014.  Sodium 143 potassium 4 BUN 8 creatinine 0.54.        ASSESSMENT/PLAN        Constipation as apparently this is intermittent will start senna-s each bedtime hold for loose stools-also will check her electrolytes.  #2-right finger abrasion this appears to be fairly unremarkable at this point continue to monitor.  #3 anxiety Will start low dose Ativan 0.125 mg twice a day when necessary and monitor this.  Poor by mouth intake she is followed by speech therapy she is eating and drinking better labs have been fairly unremarkable we'll update these.--She is on Magic cup supplements twice a day--Will discontinue PICC line  :  Right-sided embolic CVA. Now on Eliquis, as described.  Atrial fibrillation. Her heart rate is controlled will have to be monitored. She is on Lopressor 25 t.i.d.  Hyperlipidemia. On Lipitor. I am not  sure if this is primary or secondary prevention  Gastroesophageal reflux disease. On Prilosec.  Urinary retention. At one point had a catheter but apparently she is voiding well according to nursing staff     [phone]

## 2014-05-01 NOTE — Telephone Encounter (Signed)
Mavis from [place] [facility] calling to request sooner appointment for patient, states that patient was recently discharged from the hospital, please return call and advise.

## 2014-05-01 NOTE — Telephone Encounter (Signed)
Dr. Jannifer Franklin I scheduled this patient with you next week, per [facility].  It looks like she had a stroke, does she need to be scheduled with Dr. Erlinda Hong instead.  You see the patient for tremor and abnormal gait.

## 2014-05-02 ENCOUNTER — Ambulatory Visit (HOSPITAL_COMMUNITY): Payer: No Typology Code available for payment source

## 2014-05-02 ENCOUNTER — Ambulatory Visit (HOSPITAL_COMMUNITY): Payer: No Typology Code available for payment source | Attending: Internal Medicine

## 2014-05-02 DIAGNOSIS — I4891 Unspecified atrial fibrillation: Secondary | ICD-10-CM | POA: Diagnosis not present

## 2014-05-02 DIAGNOSIS — G20 Parkinson's disease: Secondary | ICD-10-CM | POA: Insufficient documentation

## 2014-05-02 DIAGNOSIS — D729 Disorder of white blood cells, unspecified: Secondary | ICD-10-CM | POA: Diagnosis not present

## 2014-05-02 DIAGNOSIS — I10 Essential (primary) hypertension: Secondary | ICD-10-CM | POA: Diagnosis not present

## 2014-05-03 ENCOUNTER — Encounter: Payer: Self-pay | Admitting: Internal Medicine

## 2014-05-03 ENCOUNTER — Non-Acute Institutional Stay (SKILLED_NURSING_FACILITY): Payer: Medicare Other | Admitting: Internal Medicine

## 2014-05-03 DIAGNOSIS — I482 Chronic atrial fibrillation, unspecified: Secondary | ICD-10-CM

## 2014-05-03 DIAGNOSIS — D72829 Elevated white blood cell count, unspecified: Secondary | ICD-10-CM

## 2014-05-03 DIAGNOSIS — L0390 Cellulitis, unspecified: Secondary | ICD-10-CM | POA: Insufficient documentation

## 2014-05-03 DIAGNOSIS — L03011 Cellulitis of right finger: Secondary | ICD-10-CM

## 2014-05-03 NOTE — Progress Notes (Signed)
Patient ID: Erin [name], female   DOB: Jun 07, 1930, 80 y.o.   MRN: [id number]   this is an acute visit  FACILITY: [facility]: SNF   CHIEF COMPLAINT: Acute visit secondary to leukocytosis  .  HISTORY OF PRESENT ILLNESS  : This is an 80 year-old lady with a history of questionable Parkinson's disease, followed by Dr. Jannifer Franklin, with frequent falls and atrial fibrillation,  She presented to hospital on September 9th after a mechanical fall when she lost her balance, landing on her left hip. X-rays revealed a left intertrochanteric hip fracture. She was noted to have some weakness. A CT scan showed a right parietal lobe distribution infarct, right middle cerebral artery infarct. CTA of the head and neck consistent with an acute infarct in the right basal ganglia and right parietal white matter, high-grade stenosis versus occlusion of the distal right M1 segment. The patient did not receive TPA. Echocardiogram showed an EF of 60% with no wall motion abnormalities. Venous dopplers of the lower extremities were negative. The patient underwent hip fracture repair on September 14th by Dr. Erlinda Hong. She was discharged on a dysphagia I with honey-thick. In regards to the CVA she is on Elliquis for anticoagulation.  Fairly routine lab work was done which has come back remarkable for elevated white count of 17.5-she has been afebrile and does not have any overt signs of being septic certainly-however we did repeat the lab and it was fairly comparable with the previous lab.  We also ordered an update labs today showed a white count of 15.7.  A urinalysis and culture has been obtained as well as a chest x-ray which did not really show any acute process.  She continues to be weak but at her baseline clinically.  She has seen orthopedics and they did start her on Augmentin for a 10 day course-she does have what appears to be a small wound on her right middle finger-this has some mild erythema and  apparently nursing staff has noted some exudate although it appears dry today.  Currently she does not complain of any chest pain shortness of breath or dysuria.      .  .  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  Left hip fracture.  Hypertension.  Atrial fibrillation. Now on Eliquis. I am assuming that this is a permanent addition.  Dysphagia secondary to a CVA. On honey-thick liquids.  Dehydration requiring fluid at night through a PICC line. She does not currently appear to be dehydrated and I am hopeful to get this removed. Follow-up lab work in two days  .  CURRENT MEDICATIONS: Medication list is reviewed  .  SOCIAL HISTORY:  HOUSING: She was living at home and she states a son was living with her.  [name]  REVIEW OF SYSTEMS:  Gen. no complaints of fever or chills.  Skin-again does complain somewhat of a right middle finger abrasion as noted above she has been started on an antibiotic by orthopedics  Respiratory does not complain of shortness of breath or cough.  Cardiac history of A. fib but does not complaining of chest pain.  GI-is not complaining of abdominal pain nausea or vomiting does complain of occasional constipation GU does not specifically complain of dysuria burning urination.  Muscle skeletal is not complaining of joint pain currently.  Neurologic-history of possible Parkinson's disease followed by neurology the patient denies Parkinson's does have tremors which is baseline.  Psych does complain of some anxiety which keeps her up at night.  .  PHYSICAL EXAMINATION   Temperature 97.4 pulse 98 respirations 20 blood pressure 120/80 with saturations in the 90s:  GENERAL APPEARANCE: The patient is not in any distress. Conversational.  Her skin is warm and dry do note on the proximal right middle finger there is a small crusted area surrounded with some  pale erythema this is not really acutely tender to palpation  nor warm there is no exudate - apparently has had some exudate andis on on antibiotic   CHEST/RESPIRATORY: Clear air entry bilaterally. No labored breathing CARDIOVASCULAR:  CARDIAC: Irregular irregular rate and rhythm without murmur  Abdomen is soft nontender there are positive bowel sounds  .  Muscle skeletal has significant left-sided weakness and has movement in her left arm and leg .  Neurologic as noted above she does have a tremor of her right upper extremity .  Psych-she is alert and appears grossly oriented  Labs.  05/02/2014.  WBC 17.5 hemoglobin 10.8 platelets 313.  Sodium 138 potassium 4.4 BUN 14 creatinine 0.57.  Alkaline phosphatase 173 albumin 2.9 otherwise her function tests within normal limits.  On October 23 white count was slightly down at 15.7 hemoglobin stable at 10.8.  Granulocytes elevated at 14.      04/24/2014.  Sodium 143 potassium 4 BUN 8 creatinine 0.54  .  ASSESSMENT/PLAN  Leukocytosis-unclear etiology she does have elevated granulocytes-at this point will await results of urine culture-chest x-ray appear to be negative for any respiratory etiology-also have obtained blood cultures 2 will await those results continue to monitor carefully--this was discussed with Dr. Dellia Nims via phone.  She does continue on Augmentin for the finger wound --apparently there has been some exudate at times  :  Right-sided embolic CVA. Now on Eliquis, as described.  Atrial fibrillation. Her heart rate is controlled will have to be monitored. She is on Lopressor 25 t.i.d.  Hyperlipidemia. On Lipitor. I am not sure if this is primary or secondary prevention  Gastroesophageal reflux disease. On Prilosec.  Urinary retention. At one point had a catheter but apparently she is voiding well according to nursing staff--nonetheless secondary to elevated white count have obtained a urinalysis and culture  [id number]

## 2014-05-04 ENCOUNTER — Encounter: Payer: Self-pay | Admitting: Internal Medicine

## 2014-05-04 ENCOUNTER — Non-Acute Institutional Stay (SKILLED_NURSING_FACILITY): Payer: Medicare Other | Admitting: Internal Medicine

## 2014-05-04 DIAGNOSIS — D72829 Elevated white blood cell count, unspecified: Secondary | ICD-10-CM

## 2014-05-04 NOTE — Progress Notes (Signed)
Patient ID: Erin [name], female   DOB: 1929-10-21, 80 y.o.   MRN: [id number]   this is an acute visit  FACILITY: [facility]: SNF   CHIEF COMPLAINT: Acute visit follow up leukocytosis  .  HISTORY OF PRESENT ILLNESS  : This is an 80 year-old lady with a history of questionable Parkinson's disease, followed by Dr. Jannifer Franklin, with frequent falls and atrial fibrillation,  She presented to hospital on September 9th after a mechanical fall when she lost her balance, landing on her left hip. X-rays revealed a left intertrochanteric hip fracture. She was noted to have some weakness. A CT scan showed a right parietal lobe distribution infarct, right middle cerebral artery infarct. CTA of the head and neck consistent with an acute infarct in the right basal ganglia and right parietal white matter, high-grade stenosis versus occlusion of the distal right M1 segment. The patient did not receive TPA. Echocardiogram showed an EF of 60% with no wall motion abnormalities. Venous dopplers of the lower extremities were negative. The patient underwent hip fracture repair on September 14th by Dr. Erlinda Hong. She was discharged on a dysphagia I with honey-thick. In regards to the CVA she is on Elliquis for anticoagulation.  Fairly routine lab work was done which had come back remarkable for elevated white count of 17.5-she has been afebrile and does not have any overt signs of being septic certainly-however we did repeat the lab and it was fairly comparable with the previous lab.  We also ordered an update labswhich showeda white count of 15.7.  A urinalysis and culture has been obtained as well as a chest x-ray which did not really show any acute process--however the urine culture is still pending.  She continues to be weak but at her baseline clinically. We've been monitoring her status fairly closely with the elevated white count but have not really been able to ascertain a specific area concerning for  sepsis.  Blood cultures are pending she did have a PICC line which has since been removed.  So far there've been 2 blood cultures one appears to be negative the other one shows gram-positive cocci in clusters-still awaiting the final findings   She has seen orthopedics and they did start her on Augmentin for a 10 day course-she does have what appears to be a small wound on her right middle finger-this has some mild erythema and apparently nursing staff has noted some exudate although it appears dry today.  Currently she does not complain of any chest pain shortness of breath or dysuria. She continues to be afebrile -- appears to be quite weak but this is not new  .  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  Left hip fracture.  Hypertension.  Atrial fibrillation. Now on Eliquis. I am assuming that this is a permanent additio.     [name]  CURRENT MEDICATIONS: Medication list is reviewed  .  SOCIAL HISTORY:  HOUSING: She was living at home and she states a son was living with her.  [name]  REVIEW OF SYSTEMS:  Gen. no complaints of fever or chills.  Skin-again does complain somewhat of a right middle finger abrasion as noted above she has been started on an antibiotic by orthopedics  Respiratory does not complain of shortness of breath or cough.  Cardiac history of A. fib but does not complaining of chest pain.  GI-is not complaining of abdominal pain nausea  or vomiting does complain of occasional constipation  GU does not specifically complain of dysuria burning urination.today  Muscle skeletal is not complaining of joint pain currently.  Neurologic-history of possible Parkinson's disease followed by neurology the patient denies Parkinson's does have tremors which is baseline. .  .  PHYSICAL EXAMINATION  Temperature 97.8 pulse 98 respirations 19 and blood pressure 127/65:  GENERAL APPEARANCE: The patient is not in any distress.  Conversational.  Her skin is warm and dry do note on the proximal right middle finger there is a small crusted area surrounded with some pale erythema this is not really acutely tender to palpation nor warm there is no exudate - apparently has had some exudate andis on on antibiotic  CHEST/RESPIRATORY: Clear air entry bilaterally. No labored breathing  CARDIOVASCULAR:  CARDIAC: Irregular irregular rate and rhythm without murmur  Abdomen is soft nontender there are positive bowel sounds  .  Muscle skeletal has significant left-sided weakness and has movement in her left arm and leg--she is kyphotic .  Neurologic as noted above she does have a tremor of her right upper extremity .  Psych-she is alert and appears grossly oriented   Labs.  05/10/2014.  Sodium 139 potassium 4.1 BUN 18 creatinine  0.6     05/02/2014.  WBC 17.5 hemoglobin 10.8 platelets 313.  Sodium 138 potassium 4.4 BUN 14 creatinine 0.57.  Alkaline phosphatase 173 albumin 2.9 otherwise her function tests within normal limits.  On October 23 white count was slightly down at 15.7 hemoglobin stable at 10.8.  Granulocytes elevated at 14.  04/24/2014.  Sodium 143 potassium 4 BUN 8 creatinine 0.54  .  ASSESSMENT/PLAN  Leukocytosis-unclear etiology she does have elevated granulocytes-at this point will await results of urine culture-chest x-ray appear to be negative for any respiratory etiology-also have obtained blood cultures 2 --one appears to be negative the other has grown out gram-positive cocci in clusters-still awaiting final results--she does not give a septic presentation--continues to be afebrile with no complaints that would indicate sepsis-again will have to be monitored closely.  She does continue on Augmentin for the finger wound --apparently there has been some exudate at times--this appears stable per exam today   [id number] :

## 2014-05-06 ENCOUNTER — Non-Acute Institutional Stay (SKILLED_NURSING_FACILITY): Payer: Medicare Other | Admitting: Internal Medicine

## 2014-05-06 DIAGNOSIS — A4101 Sepsis due to Methicillin susceptible Staphylococcus aureus: Secondary | ICD-10-CM

## 2014-05-07 ENCOUNTER — Ambulatory Visit (HOSPITAL_COMMUNITY)
Admit: 2014-05-07 | Discharge: 2014-05-07 | Disposition: A | Discharge status: Home / Self Care | Payer: Medicare Other | Source: Skilled Nursing Facility | Attending: Obstetrics & Gynecology | Admitting: Obstetrics & Gynecology

## 2014-05-07 ENCOUNTER — Inpatient Hospital Stay (HOSPITAL_COMMUNITY): Payer: Medicare Other | Attending: Internal Medicine

## 2014-05-07 DIAGNOSIS — N390 Urinary tract infection, site not specified: Secondary | ICD-10-CM | POA: Insufficient documentation

## 2014-05-07 MED ORDER — SODIUM CHLORIDE 0.9 % IJ SOLN
10.0000 mL | INTRAMUSCULAR | Status: DC | PRN
  Administered 2014-05-07: 40 mL

## 2014-05-07 MED ORDER — SODIUM CHLORIDE 0.9 % IJ SOLN: 10.0000 mL | Freq: Two times a day (BID) | INTRAMUSCULAR | Status: DC

## 2014-05-07 NOTE — Progress Notes (Signed)
Peripherally Inserted Central Catheter/Midline Placement  The IV Nurse has discussed with the patient and/or persons authorized to consent for the patient, the purpose of this procedure and the potential benefits and risks involved with this procedure.  The benefits include less needle sticks, lab draws from the catheter and patient may be discharged home with the catheter.  Risks include, but not limited to, infection, bleeding, blood clot (thrombus formation), and puncture of an artery; nerve damage and irregular heat beat.  Alternatives to this procedure were also discussed.  PICC/Midline Placement Documentation  PICC / Midline Single Lumen 84/16/60 PICC Right Basilic 40 cm 0 cm (Active)  Indication for Insertion or Continuance of Line Administration of hyperosmolar/irritating solutions (i.e. TPN, Vancomycin, etc.) 05/07/2014 11:00 AM  Exposed Catheter (cm) 40 cm 05/07/2014 11:00 AM  Site Assessment Clean;Dry;Intact 05/07/2014 11:00 AM  Line Status Flushed;Saline locked;Blood return noted;Capped (central line) 05/07/2014 11:00 AM  Dressing Type Transparent;Securing device 05/07/2014 11:00 AM  Dressing Status Clean;Dry;Intact;Antimicrobial disc in place 05/07/2014 11:00 AM  [place] pulled back 05/07/2014 11:48 AM  Dressing Intervention New dressing;Antimicrobial disc changed;Dressing changed 05/07/2014 11:48 AM  Dressing Change Due 05/14/14 05/07/2014 11:00 AM       Petra Kuba D 05/07/2014, 1:37 PM

## 2014-05-07 NOTE — Discharge Instructions (Signed)
PICC Home Guide A peripherally inserted central catheter (PICC) is a long, thin, flexible tube that is inserted into a vein in the upper arm. It is a form of intravenous (IV) access. It is considered to be a "central" line because the tip of the PICC ends in a large vein in your chest. This large vein is called the superior vena cava (SVC). The PICC tip ends in the SVC because there is a lot of blood flow in the SVC. This allows medicines and IV fluids to be quickly distributed throughout the body. The PICC is inserted using a sterile technique by a specially trained nurse or physician. After the PICC is inserted, a chest X-ray exam is done to be sure it is in the correct place.  A PICC may be placed for different reasons, such as:  To give medicines and liquid nutrition that can only be given through a central line. Examples are:  Certain antibiotic treatments.  Chemotherapy.  Total parenteral nutrition (TPN).  To take frequent blood samples.  To give IV fluids and blood products.  If there is difficulty placing a peripheral intravenous (PIV) catheter. If taken care of properly, a PICC can remain in place for several months. A PICC can also allow a person to go home from the hospital early. Medicine and PICC care can be managed at home by a family member or home health care team. WHAT PROBLEMS CAN HAPPEN WHEN I HAVE A PICC? Problems with a PICC can occasionally occur. These may include the following:  A blood clot (thrombus) forming in or at the tip of the PICC. This can cause the PICC to become clogged. A clot-dissolving medicine called tissue plasminogen activator (tPA) can be given through the PICC to help break up the clot.  Inflammation of the vein (phlebitis) in which the PICC is placed. Signs of inflammation may include redness, pain at the insertion site, red streaks, or being able to feel a "cord" in the vein where the PICC is located.  Infection in the PICC or at the insertion  site. Signs of infection may include fever, chills, redness, swelling, or pus drainage from the PICC insertion site.  PICC movement (malposition). The PICC tip may move from its original position due to excessive physical activity, forceful coughing, sneezing, or vomiting.  A break or cut in the PICC. It is important to not use scissors near the PICC.  Nerve or tendon irritation or injury during PICC insertion. WHAT SHOULD I KEEP IN MIND ABOUT ACTIVITIES WHEN I HAVE A PICC?  You may bend your arm and move it freely. If your PICC is near or at the bend of your elbow, avoid activity with repeated motion at the elbow.  Rest at home for the remainder of the day following PICC line insertion.  Avoid lifting heavy objects as instructed by your health care provider.  Avoid using a crutch with the arm on the same side as your PICC. You may need to use a walker. WHAT SHOULD I KNOW ABOUT MY PICC DRESSING?  Keep your PICC bandage (dressing) clean and dry to prevent infection.  Ask your health care provider when you may shower. Ask your health care provider to teach you how to wrap the PICC when you do take a shower.  Change the PICC dressing as instructed by your health care provider.  Change your PICC dressing if it becomes loose or wet. WHAT SHOULD I KNOW ABOUT PICC CARE?  Check the PICC insertion site   daily for leakage, redness, swelling, or pain.  Do not take a bath, swim, or use hot tubs when you have a PICC. Cover PICC line with clear plastic wrap and tape to keep it dry while showering.  Flush the PICC as directed by your health care provider. Let your health care provider know right away if the PICC is difficult to flush or does not flush. Do not use force to flush the PICC.  Do not use a syringe that is less than 10 mL to flush the PICC.  Never pull or tug on the PICC.  Avoid blood pressure checks on the arm with the PICC.  Keep your PICC identification card with you at all  times.  Do not take the PICC out yourself. Only a trained clinical professional should remove the PICC. SEEK IMMEDIATE MEDICAL CARE IF:  Your PICC is accidentally pulled all the way out. If this happens, cover the insertion site with a bandage or gauze dressing. Do not throw the PICC away. Your health care provider will need to inspect it.  Your PICC was tugged or pulled and has partially come out. Do not  push the PICC back in.  There is any type of drainage, redness, or swelling where the PICC enters the skin.  You cannot flush the PICC, it is difficult to flush, or the PICC leaks around the insertion site when it is flushed.  You hear a "flushing" sound when the PICC is flushed.  You have pain, discomfort, or numbness in your arm, shoulder, or jaw on the same side as the PICC.  You feel your heart "racing" or skipping beats.  You notice a hole or tear in the PICC.  You develop chills or a fever. MAKE SURE YOU:   Understand these instructions.  Will watch your condition.  Will get help right away if you are not doing well or get worse. Document Released: 01/02/2003 Document Revised: 11/12/2013 Document Reviewed: 03/05/2013 Please call Arita Severtson or Anderson Malta if any problems with [phone]

## 2014-05-08 ENCOUNTER — Encounter: Payer: Self-pay | Admitting: Neurology

## 2014-05-08 ENCOUNTER — Encounter: Payer: Self-pay | Admitting: *Deleted

## 2014-05-08 ENCOUNTER — Ambulatory Visit (INDEPENDENT_AMBULATORY_CARE_PROVIDER_SITE_OTHER): Payer: Medicare Other | Admitting: Neurology

## 2014-05-08 VITALS — BP 112/73 | HR 102

## 2014-05-08 DIAGNOSIS — G252 Other specified forms of tremor: Principal | ICD-10-CM

## 2014-05-08 DIAGNOSIS — I639 Cerebral infarction, unspecified: Secondary | ICD-10-CM

## 2014-05-08 DIAGNOSIS — I6340 Cerebral infarction due to embolism of unspecified cerebral artery: Secondary | ICD-10-CM

## 2014-05-08 DIAGNOSIS — R251 Tremor, unspecified: Secondary | ICD-10-CM

## 2014-05-08 DIAGNOSIS — G250 Essential tremor: Secondary | ICD-10-CM

## 2014-05-08 HISTORY — DX: Cerebral infarction, unspecified: I63.9

## 2014-05-08 MED ORDER — TOPIRAMATE 25 MG PO TABS: 25.0000 mg | ORAL_TABLET | Freq: Two times a day (BID) | ORAL | Status: DC

## 2014-05-08 NOTE — Patient Instructions (Signed)
Stroke Prevention Some medical conditions and behaviors are associated with an increased chance of having a stroke. You may prevent a stroke by making healthy choices and managing medical conditions. HOW CAN I REDUCE MY RISK OF HAVING A STROKE?   Stay physically active. Get at least 30 minutes of activity on most or all days.  Do not smoke. It may also be helpful to avoid exposure to secondhand smoke.  Limit alcohol use. Moderate alcohol use is considered to be:  No more than 2 drinks per day for men.  No more than 1 drink per day for nonpregnant women.  Eat healthy foods. This involves:  Eating 5 or more servings of fruits and vegetables a day.  Making dietary changes that address high blood pressure (hypertension), high cholesterol, diabetes, or obesity.  Manage your cholesterol levels.  Making food choices that are high in fiber and low in saturated fat, trans fat, and cholesterol may control cholesterol levels.  Take any prescribed medicines to control cholesterol as directed by your health care provider.  Manage your diabetes.  Controlling your carbohydrate and sugar intake is recommended to manage diabetes.  Take any prescribed medicines to control diabetes as directed by your health care provider.  Control your hypertension.  Making food choices that are low in salt (sodium), saturated fat, trans fat, and cholesterol is recommended to manage hypertension.  Take any prescribed medicines to control hypertension as directed by your health care provider.  Maintain a healthy weight.  Reducing calorie intake and making food choices that are low in sodium, saturated fat, trans fat, and cholesterol are recommended to manage weight.  Stop drug abuse.  Avoid taking birth control pills.  Talk to your health care provider about the risks of taking birth control pills if you are over 35 years old, smoke, get migraines, or have ever had a blood clot.  Get evaluated for sleep  disorders (sleep apnea).  Talk to your health care provider about getting a sleep evaluation if you snore a lot or have excessive sleepiness.  Take medicines only as directed by your health care provider.  For some people, aspirin or blood thinners (anticoagulants) are helpful in reducing the risk of forming abnormal blood clots that can lead to stroke. If you have the irregular heart rhythm of atrial fibrillation, you should be on a blood thinner unless there is a good reason you cannot take them.  Understand all your medicine instructions.  Make sure that other conditions (such as anemia or atherosclerosis) are addressed. SEEK IMMEDIATE MEDICAL CARE IF:   You have sudden weakness or numbness of the face, arm, or leg, especially on one side of the body.  Your face or eyelid droops to one side.  You have sudden confusion.  You have trouble speaking (aphasia) or understanding.  You have sudden trouble seeing in one or both eyes.  You have sudden trouble walking.  You have dizziness.  You have a loss of balance or coordination.  You have a sudden, severe headache with no known cause.  You have new chest pain or an irregular heartbeat. Any of these symptoms may represent a serious problem that is an emergency. Do not wait to see if the symptoms will go away. Get medical help at once. Call your local emergency services (911 in [place] Do not drive yourself to the hospital. Document Released: 08/05/2004 Document Revised: 11/12/2013 Document Reviewed: 12/29/2012 [employer] Patient Information 2015 [employer]. This information is not intended to replace advice given   to you by your health care provider. Make sure you discuss any questions you have with your health care provider.  

## 2014-05-08 NOTE — Progress Notes (Signed)
Reason for visit: Stroke  Erin [name] is an 80 y.o. female  History of present illness:  Erin [name] is an 80 year old right-handed white female with a history of a benign essential tremor, treated with Topamax. The patient unfortunately was admitted to the hospital in September 2015 following a fall which resulted in a left hip fracture. The patient had onset of a left hemiparesis 2 days after admission on 03/22/2014. The patient was found to have a cardioembolic stroke event affecting the right centrum semiovale and basal ganglia area. The patient had a distal MCA occlusion. CT angiogram of the head and neck did not show evidence of basilar, vertebral, or carotid occlusion. A 2-D echocardiogram showed an ejection fraction 55-60% without definite source of thrombus noted. The patient has been in therapy since that time, and she is in an extended care facility currently. She is just now starting to bear some weight, and walk on the bars with a physical therapist. The patient is getting occupational and speech therapy as well. She is on a pured diet. She reports no visual disturbance. She is having some occasional left thigh pain associated with a hip fracture. She has had hip surgery. She now is on Eliquis as an anticoagulant. She comes to this office for an evaluation. She is off of Topamax, and her tremor has increased on the right arm, the stroke eliminated the tremor on the left arm.  Past Medical History  Diagnosis Date  . Anxiety   . Arthritis   . Hyperlipidemia   . Essential hypertension, benign   . Osteoporosis   . GERD (gastroesophageal reflux disease)   . Hearing loss   . Restless leg   . Tremor   . Leg edema   . Obese   . Gall bladder disease   . Cataract   . Carpal tunnel syndrome of left wrist   . Atrial fibrillation   . Sleep apnea   . Esophageal dilatation   . Frequent falls   . Parkinson disease   . History of nuclear stress test 08/31/2012    Lexiscan  cardiolite negative for ischemia  . Polyneuropathy in other diseases classified elsewhere 10/03/2013  . Cardioembolic stroke 57/07/7791    Past Surgical History  Procedure Laterality Date  . Appendectomy    . Cholecystectomy    . Abdominal hysterectomy    . Fracture surgery      Left arm x4  . Benign cyst removed from kidney and lung, bilateral mastectomy    . Bilateral great toenail removal    . Knee arthroscopy  2012    Right knee, Dr Aline Brochure  . Left forearm    . Breast surgery      Bilateral 2000  . Tonsillectomy    . R hand surgery    . Bronchoscopy  02/15/2000  . Right vats.  "  . Right upper lobe wedge resection.  "  . Upper gastrointestinal endoscopy  11/25/1999    Esophagitis/ Normal proximal esophagus, stomach and duodenum  . Colonoscopy  01/17/2009    Dr. Deatra Ina : Internal hemorrhoids/Diverticula, scattered in the ascending colon/  Moderate diverticulosis ascending colon to sigmoid colon  . Esophagogastroduodenoscopy   04/23/2003    Dr. Deatra Ina: HIATAL HERNIA  . Colonoscopy  01/16/2001    Normal  . Cataract extraction, bilateral    . Esophagogastroduodenoscopy  03/20/2012    JQZ:ESPQZRAQ ring-LIKELY CAUSING MILD DYSPHAGIA/SMALL hiatal hernia/Multiple sessile polyps ranging between 3-45mm , path benign  . Cataract extraction    .  Carpal tunnel release      Left wrist  . Transthoracic echocardiogram  06/24/2009    EF=>55%, mild assymetric LVH; LA mildly dilated; mild mitral annular calcif, borderline MVP, mild-mod MR; mild-mod TR, RV systolic pressure elevated, mild pulm HTN; AV mildly sclerotic; mild pulm valve regurg - ordered r/t bradycardia   . Endovenous ablation saphenous vein w/ laser  01/2011    Right GSV  . Cholecystectomy    . Intramedullary (im) nail intertrochanteric Left 03/25/2014    Procedure: INTRAMEDULLARY NAIL INTERTROCHANTRIC LEFT HIP;  Surgeon: Marianna Payment, MD;  Location: [facility];  Service: Orthopedics;  Laterality: Left;    Family History    Problem Relation Age of Onset  . Diabetes Mother   . Heart disease Mother   . Stroke Mother   . Cancer Sister     KIDNEY  . Emphysema Sister   . Heart disease Brother   . Heart disease Sister   . Emphysema Sister   . Cancer Sister     BREAST  . Heart disease Brother   . Colon cancer Sister   . Alzheimer's disease Father   . Heart disease Sister   . Tremor Sister   . Tremor Brother   . Alcoholism Child   . Cancer Brother   . Pancreatic cancer Brother   . Diabetes Son     Social history:  reports that she has never smoked. She has never used smokeless tobacco. She reports that she does not drink alcohol or use illicit drugs.    Allergies  Allergen Reactions  . Codeine Hives  . Morphine And Related Itching and Other (See Comments)    Makes pt hallucinate  . Actonel [Risedronate Sodium] Other (See Comments)    Abdominal pain  . Fosamax [Alendronate Sodium] Other (See Comments)    Abdominal pian  . Losartan Other (See Comments)    Hospitalized in 06/2013 with pancreatitis, losartan is associated with increased risk of pancreatitis ,  Hence discontinued  . Nitrofurantoin Other (See Comments)    Hospitalized with pancreatitis in 06/2013. Nitrofurantoin implicated as a possible cause    Medications:  Current Outpatient Prescriptions on File Prior to Visit  Medication Sig Dispense Refill  . apixaban (ELIQUIS) 5 MG TABS tablet Take 5 mg by mouth 2 (two) times daily.      [name] atorvastatin (LIPITOR) 40 MG tablet Take 40 mg by mouth daily.      . metoprolol tartrate (LOPRESSOR) 25 MG tablet Take 25 mg by mouth 3 (three) times daily.      [name] omeprazole (PRILOSEC) 20 MG capsule Take 40 mg by mouth 2 (two) times daily.       [name] oxyCODONE (ROXICODONE) 5 MG immediate release tablet Take one or two tablets by mouth every 4 hours as needed for pain  360 tablet  0   No current facility-administered medications on file prior to visit.    ROS:  Out of a complete 14 system review of  symptoms, the patient complains only of the following symptoms, and all other reviewed systems are negative.  Chills Cough, wheezing, shortness of breath, choking, chest tightness Chest pain, leg swelling Heat intolerance Nausea, vomiting Restless legs Sleep talking Joint pain, joint swelling, back pain, aching muscles, muscle cramps, walking difficulty Skin rash Easy bruising Depression  Blood pressure 112/73, pulse 102, height 0' (0 m), weight 0 lb (0 kg).  Physical Exam  General: The patient is alert and cooperative at the time of the examination.  Skin: No significant peripheral edema is noted.   Neurologic Exam  Mental status: The patient is oriented x 3.  Cranial nerves: Facial symmetry is not present. There is a significant depression of the left nasolabial fold. Speech is normal, no aphasia or dysarthria is noted. Extraocular movements are full. Visual fields are full, with except that there is a deficit in the left upper quadrant.  Motor: The patient has good strength in the right extremities. On the left side, the patient has 4 minus/5 grip and deltoid strength, 4/5 strength in the biceps and triceps. With the left lower extremity, the patient has 4/5 strength throughout.  Sensory examination: Soft touch sensation is symmetric on the face, arms, and legs.  Coordination: The patient has good finger-nose-finger and heel-to-shin on the right. On the left side, the patient has difficulty with finger-nose-finger and heel-to-shin. The patient has an intention tremor with the right upper extremity.  Gait and station: The patient is wheelchair-bound, gait was not attempted.  Reflexes: Deep tendon reflexes are symmetric, but are slightly depressed.   Assessment/Plan:  1. Benign essential tremor  2. Atrial fibrillation  3. Cardioembolic stroke, right brain  4. Left hip fracture  The patient is now on Eliquis, and she will remain on this medication. The patient is to  continue physical, occupational, and speech therapy. The patient apparently is off of Topamax, this will be restarted, taking 25 mg twice daily for the tremor that involves the right upper extremity. She will follow-up in about 3-4 months.  Jill Alexanders MD 05/08/2014 7:08 PM  [facility] [address] [place], [place] [address]  Phone [phone] Fax [phone]

## 2014-05-13 NOTE — Progress Notes (Addendum)
Patient ID: Erin [name], female   DOB: [birth year], 80 y.o.   MRN: [id number]               PROGRESS NOTE  DATE:  05/06/2014    FACILITY: [facility]    LEVEL OF CARE:   SNF   Acute Visit   HISTORY OF PRESENT ILLNESS:  This is a patient who has been in the building after a stay at [facility] from 03/28/2014 through 04/17/2014.  She suffered an intertrochanteric fracture of her left hip.   A CT scan of the head showed a right parietal lobe infarct.    When she came here, she had a PICC line in place as she was not taking enough fluid in.  There were orders to supplement her fluid through the PICC line at night.  Fortunately, we have not required this.    It was noted, I think with some nonspecific symptoms such as anxiety, that she had an elevated white count at 18,000.  The PICC line was then removed.  A chest x-ray was clear.  A blood culture was done that ultimately has shown Staph aureus.  She was put on Augmentin late last week for a small wound on her ring finger on the left hand.  In spite of the Staph aureus, she has remained stable and her white count has come down to normal.    PHYSICAL EXAMINATION:   GENERAL APPEARANCE:  The patient looks somewhat depressed, asking to go home.   CHEST/RESPIRATORY:  Clear air entry bilaterally.   CARDIOVASCULAR:  CARDIAC:  Atrial fibrillation.  However, she appears to be euvolemic.   GASTROINTESTINAL:  ABDOMEN:   Soft.  There is no tenderness.   NEUROLOGICAL:    SENSATION/STRENGTH:  Left hemiparesis.     ASSESSMENT/PLAN:  Staph aureus sepsis.  Probably from the PICC line, which has been removed.  Although everything seems to have stabilized, I think she is going to need intravenous antibiotics for two weeks.   The organism is methicillin-sensitive Staph aureus.  I have started her on Ancef.  This will be 1 g q.8 for two weeks.    Enterococcus in her urine.  The Ancef should cover this as it is ampicillin-sensitive.

## 2014-05-14 ENCOUNTER — Non-Acute Institutional Stay (SKILLED_NURSING_FACILITY): Payer: Medicare Other | Admitting: Internal Medicine

## 2014-05-14 ENCOUNTER — Encounter: Payer: Self-pay | Admitting: Internal Medicine

## 2014-05-14 DIAGNOSIS — R251 Tremor, unspecified: Secondary | ICD-10-CM

## 2014-05-14 DIAGNOSIS — R7881 Bacteremia: Secondary | ICD-10-CM | POA: Insufficient documentation

## 2014-05-14 DIAGNOSIS — D72829 Elevated white blood cell count, unspecified: Secondary | ICD-10-CM

## 2014-05-14 NOTE — Progress Notes (Signed)
Patient ID: Erin [name], female   DOB: Aug 07, 1929, 80 y.o.   MRN: [id number]                                                     Patient ID: Erin [name], female DOB: 07/11/1930, 80 y.o. MRN: [id number]   this is an acute visit  FACILITY: [facility]: SNF   CHIEF COMPLAINT: Acute visit secondary follow-up bacteremia-leukocytosis  .  HISTORY OF PRESENT ILLNESS  : This is an 80 year-old lady with a history of questionable Parkinson's disease, followed by Dr. Jannifer Franklin, with frequent falls and atrial fibrillation,  She presented to hospital on September 9th after a mechanical fall when she lost her balance, landing on her left hip. X-rays revealed a left intertrochanteric hip fracture. She was noted to have some weakness. A CT scan showed a right parietal lobe distribution infarct, right middle cerebral artery infarct. CTA of the head and neck consistent with an acute infarct in the right basal ganglia and right parietal white matter, high-grade stenosis versus occlusion of the distal right M1 segment. The patient did not receive TPA. Echocardiogram showed an EF of 60% with no wall motion abnormalities. Venous dopplers of the lower extremities were negative. The patient underwent hip fracture repair on September 14th by Dr. Erlinda Hong. She was discharged on a dysphagia I with honey-thick. In regards to the CVA she is on Elliquis for anticoagulation.  Fairly routine lab work was done which has come back remarkable for elevated white count of 17.5-she had been afebrile and does not have any overt signs of being septic certainly-however we did repeat the lab and it was fairly comparable with the previous lab. She was started on Augmentin for a finger wound-blood cultures eventually did grow out MSSA-Dr. Dellia Nims did evaluate this and started her on Ancef she is completing a two-week course of this.  In fact repeat lab work showed normalization of her white  count as the Ancef was started-it was thought most likely this was bacteria from the PICC line.  She continues to be stable does not show any septic presentation chest x-ray did not show any acute process a urine did grow out enterococcus and per Dr. Janalyn Rouse assessment Ancef should be effective against this as well     She continues to be weak but at her baseline clinically.--she is followed by neurology and per neurology note on October 26 it appears per neurologic assessment her tremor is an essential tremor she is now on Topamax twice a day-according to nursing staff somewhat better     Currently she does not complain of any chest pain shortness of breath or dysuriaor fever or chills.  She has family in the room and they wanted an update on her condition-I did explain possible etiology of the bacteremia and that she is on an antibiotic and that she appears to be stable-they expressed understanding.     .  .  PAST MEDICAL HISTORY/PROBLEM LIST:   Embolic infarctions in the right hemisphere x2, as described.   Left hip fracture.   Hypertension.   Atrial fibrillation. Now on Eliquis. I am assuming that this is a permanent addition.   Dysphagia secondary to a CVA. On honey-thick liquids.   Dehydration requiring fluid at night through a PICC line. She  does not currently appear to be dehydrated and I am hopeful to get this removed. Follow-up lab work in two days   . CURRENT MEDICATIONS: Medication list is reviewed  .  SOCIAL HISTORY:  HOUSING: She was living at home and she states a son was living with her.  [name]  REVIEW OF SYSTEMS:  Gen. no complaints of fever or chills.  Skin-right middle finger abrasion appears to be resolving Respiratory does not complain of shortness of breath or cough.  Cardiac history of A. fib but does not complaining of chest pain.  GI-is not complaining of abdominal pain nausea or vomiting does complain of occasional  constipation GU does not specifically complain of dysuria burning urination.  Muscle skeletal is not complaining of joint pain currently.  Neurologic-history of possible Parkinson's disease followed by neurology the patient denies Parkinson's does have tremors follow-up per recent neurology assessment to be essential tremor.  Psych does have some history of anxiety.  .  PHYSICAL EXAMINATION   Temperature 99.0 pulse 84 respirations 20 blood pressure 117/63 O2 saturations have been in the 90s:  GENERAL APPEARANCE: The patient is not in any distress. Conversational.  Her skin is warm and dry --right middle finger abrasion appears resolving there is some very pale erythema dried crusting not really any significant tenderness  CHEST/RESPIRATORY: Clear air entry bilaterally. No labored breathing CARDIOVASCULAR:  CARDIAC: Irregular irregular rate and rhythm without murmur  Abdomen is soft nontender there are positive bowel sounds  .  Muscle skeletal has somet left-sided weakness and has movement in her left arm and leg .  Neurologic as noted above she does have a tremor of her right upper extremitalthough does not appear to be as significant as previous exam-- .  Psych-she is alert and appears grossly oriented  Labs.  05/06/2014.  WBC 8.6 hemoglobin 10.3 platelets 368  05/02/2014.  WBC 17.5 hemoglobin 10.8 platelets 313.  Sodium 138 potassium 4.4 BUN 14 creatinine 0.57.  Alkaline phosphatase 173 albumin 2.9 otherwise her function tests within normal limits.  On October 23 white count was slightly down at 15.7 hemoglobin stable at 10.8.  Granulocytes elevated at 14.     04/24/2014.  Sodium 143 potassium 4 BUN 8 creatinine 0.54  .  ASSESSMENT/PLAN  Leukocytosis-thought most likely due to bacteremia-this has now normalized she is on a 2 week course of Ancef clinically appears to be stable   S   :   Right-sided embolic CVA. Now on Eliquis,   Essential  tremor-this appears to be somewhat improved she has seen neurology as noted above-she is now on Topamax twice a day.   Atrial fibrillation. Her heart rate is controlled will have to be monitored. She is on Lopressor 25 t.i.d.   Hyperlipidemia. On Lipitor. I  Gastroesophageal reflux disease. On Prilosec    Of note will update a CBC with differential and basic metabolic panel tomorrow to ensure stability clinically she appears stable.                                                                                     UTI-again appears relatively asymptomatic she continues on Ancef for enterococcus  [id number]--TR note greater than 25 minutes spent assessing patient-reviewing her chart-and discussion with her family at bedside-and coordinating plan of care-

## 2014-05-23 ENCOUNTER — Other Ambulatory Visit: Payer: Self-pay | Admitting: *Deleted

## 2014-05-23 MED ORDER — OXYCODONE HCL 5 MG PO TABS: ORAL_TABLET | ORAL | Status: DC

## 2014-05-23 NOTE — Telephone Encounter (Signed)
[facility] 

## 2014-05-29 ENCOUNTER — Ambulatory Visit (HOSPITAL_COMMUNITY)
Admit: 2014-05-29 | Discharge: 2014-05-29 | Disposition: A | Discharge status: Home / Self Care | Payer: Medicare PPO | Source: Skilled Nursing Facility | Attending: Internal Medicine | Admitting: Internal Medicine

## 2014-05-29 ENCOUNTER — Encounter: Payer: Self-pay | Admitting: Internal Medicine

## 2014-05-29 ENCOUNTER — Non-Acute Institutional Stay (SKILLED_NURSING_FACILITY): Payer: Medicare Other | Admitting: Internal Medicine

## 2014-05-29 ENCOUNTER — Encounter (HOSPITAL_COMMUNITY): Payer: Self-pay | Admitting: Speech Pathology

## 2014-05-29 ENCOUNTER — Inpatient Hospital Stay (HOSPITAL_COMMUNITY)
Admit: 2014-05-29 | Discharge: 2014-05-29 | Disposition: A | Discharge status: Home / Self Care | Payer: Medicare Other | Attending: Internal Medicine | Admitting: Internal Medicine

## 2014-05-29 DIAGNOSIS — Z5189 Encounter for other specified aftercare: Secondary | ICD-10-CM | POA: Insufficient documentation

## 2014-05-29 DIAGNOSIS — R1310 Dysphagia, unspecified: Secondary | ICD-10-CM | POA: Diagnosis not present

## 2014-05-29 DIAGNOSIS — R05 Cough: Secondary | ICD-10-CM

## 2014-05-29 DIAGNOSIS — R059 Cough, unspecified: Secondary | ICD-10-CM | POA: Insufficient documentation

## 2014-05-29 DIAGNOSIS — R1314 Dysphagia, pharyngoesophageal phase: Secondary | ICD-10-CM

## 2014-05-29 NOTE — Therapy (Signed)
Modified Barium Swallow  Patient Details  Name: Erin [name] MRN: [id number] Date of Birth: 1929-09-23  Encounter Date: 05/29/2014      End of Session - 05/29/14 1504    Visit Number 1   Number of Visits 1   Authorization Type Medicare   SLP Start Time 1310   SLP Time Calculation (min) 1335   SLP Time Calculation (min) 25 min   Activity Tolerance Patient tolerated treatment well      Past Medical History  Diagnosis Date  . Anxiety   . Arthritis   . Hyperlipidemia   . Essential hypertension, benign   . Osteoporosis   . GERD (gastroesophageal reflux disease)   . Hearing loss   . Restless leg   . Tremor   . Leg edema   . Obese   . Gall bladder disease   . Cataract   . Carpal tunnel syndrome of left wrist   . Atrial fibrillation   . Sleep apnea   . Esophageal dilatation   . Frequent falls   . Parkinson disease   . History of nuclear stress test 08/31/2012    Lexiscan cardiolite negative for ischemia  . Polyneuropathy in other diseases classified elsewhere 10/03/2013  . Cardioembolic stroke 76/14/7092    Past Surgical History  Procedure Laterality Date  . Appendectomy    . Cholecystectomy    . Abdominal hysterectomy    . Fracture surgery      Left arm x4  . Benign cyst removed from kidney and lung, bilateral mastectomy    . Bilateral great toenail removal    . Knee arthroscopy  2012    Right knee, Dr Aline Brochure  . Left forearm    . Breast surgery      Bilateral 2000  . Tonsillectomy    . R hand surgery    . Bronchoscopy  02/15/2000  . Right vats.  "  . Right upper lobe wedge resection.  "  . Upper gastrointestinal endoscopy  11/25/1999    Esophagitis/ Normal proximal esophagus, stomach and duodenum  . Colonoscopy  01/17/2009    Dr. Deatra Ina : Internal hemorrhoids/Diverticula, scattered in the ascending colon/  Moderate diverticulosis ascending colon to sigmoid colon  . Esophagogastroduodenoscopy   04/23/2003    Dr. Deatra Ina: HIATAL HERNIA  . Colonoscopy   01/16/2001    Normal  . Cataract extraction, bilateral    . Esophagogastroduodenoscopy  03/20/2012    HVF:MBBUYZJQ ring-LIKELY CAUSING MILD DYSPHAGIA/SMALL hiatal hernia/Multiple sessile polyps ranging between 3-52mm , path benign  . Cataract extraction    . Carpal tunnel release      Left wrist  . Transthoracic echocardiogram  06/24/2009    EF=>55%, mild assymetric LVH; LA mildly dilated; mild mitral annular calcif, borderline MVP, mild-mod MR; mild-mod TR, RV systolic pressure elevated, mild pulm HTN; AV mildly sclerotic; mild pulm valve regurg - ordered r/t bradycardia   . Endovenous ablation saphenous vein w/ laser  01/2011    Right GSV  . Cholecystectomy    . Intramedullary (im) nail intertrochanteric Left 03/25/2014    Procedure: INTRAMEDULLARY NAIL INTERTROCHANTRIC LEFT HIP;  Surgeon: Marianna Payment, MD;  Location: [place];  Service: Orthopedics;  Laterality: Left;    There were no vitals taken for this visit.  Visit [place] - 05/29/14 1504    Clinical Impression Statement Erin [name] was seen for follow up MBSS (last completed in October at [facility]) for hopeful diet texture upgrade. She presents  with mild oral phase dysphagia with delayed oral transit with solid textures, decrease bolus cohesiveness and piecemeal deglutition; mild pharyngeal phase dysphagia characterized by delay in swallow initiation triggered after filling the valleculae with puree and after spilling to pyriforms with thin liquids; decreased hyolaryngeal excursion and epiglottic inversion resulting in consistent trace penetration of thin liquids during the swallow with residuals on the underside of the epiglottis which likely receded to vocal folds x1, but was cleared with a cough. Pt was challenged with multiple swallows and never demonstrated tracheal aspiration. Recommend D3/mech soft with thin liquids via small cup sips, throat clear/cough and repeat swallow. Study reviewed with treating  SLP who feels that pt is capable of implementing strategies to reduce risk for aspiration with supervision.   Potential to Achieve Goals Good          G-Codes - 2014/06/21 1505    Functional Assessment Tool Used MBSS   Functional Limitations Swallowing   Swallow Current Status (Z6109) At least 20 percent but less than 40 percent impaired, limited or restricted   Swallow Goal Status (U0454) At least 1 percent but less than 20 percent impaired, limited or restricted   Swallow Discharge Status [id number]) At least 20 percent but less than 40 percent impaired, limited or restricted      Problem List Patient Active Problem List   Diagnosis Date Noted  . Bacteremia 05/14/2014  . Cardioembolic stroke 91/47/8295  . Leukocytosis 05/03/2014  . Cellulitis 05/03/2014  . Constipation 05/01/2014  . Anxiety state 05/01/2014  . Abrasion 05/01/2014  . CVA (cerebral infarction) 03/28/2014  . Cerebral embolism with cerebral infarction 03/23/2014  . Preoperative cardiovascular examination 03/21/2014  . Fracture, intertrochanteric, left femur 03/20/2014  . Fall at home 03/20/2014  . Hyperlipidemia with target LDL less than 100 02/16/2014  . Need for vaccination with 13-polyvalent pneumococcal conjugate vaccine 02/14/2014  . Acute cystitis 02/14/2014  . Allergic rhinitis 10/15/2013  . Insomnia 10/15/2013  . Polyneuropathy in other diseases classified elsewhere 10/03/2013  . Forgetfulness 08/05/2013  . Swelling of both ankles 06/25/2013  . Leg swelling 06/25/2013  . Acute pancreatitis 06/17/2013  . Chronic atrial fibrillation 04/09/2013  . Atypical chest pain 04/03/2013  . DNR (do not resuscitate) discussion 02/13/2013  . DNR (do not resuscitate) 02/13/2013  . Hearing loss 02/13/2013  . Muscle weakness (generalized) 11/24/2012  . Left shoulder pain 11/13/2012  . Unspecified constipation 09/05/2012  . H/O falling 08/01/2012  . Routine general medical examination at a health care facility  04/16/2012  . FH: colon cancer 02/23/2012  . Memory loss 11/08/2011  . Other general symptoms 11/08/2011  . Other vitamin B12 deficiency anemia 11/08/2011  . Essential and other specified forms of tremor 11/08/2011  . Unsteady gait 10/14/2011  . Osteoporosis, senile   . Prediabetes 03/16/2011  . BURSITIS, RIGHT KNEE 01/05/2010  . SPINAL STENOSIS, LUMBAR 11/20/2009  . SCIATICA 11/13/2009  . OSTEOARTHRITIS, KNEE, RIGHT 09/11/2009  . UNSTEADY GAIT 09/11/2009  . MEDIAL MENISCUS TEAR, RIGHT 09/11/2009  . Essential hypertension, benign 01/02/2009  . GERD 01/02/2009  . HEMORRHAGE OF RECTUM AND ANUS 01/02/2009  . PAIN IN JOINT, HAND 11/20/2008  . Pain in joint, pelvic region and thigh 11/20/2008  . KNEE PAIN 11/20/2008  . ARTHRITIS, LEFT FOOT 07/17/2007          General - 06-21-2014 1349    General Information   Date of Onset 03/20/14   HPI 80 yo female h/o freq falls, cafib, parkinsons who came in after mechanical fall  while in her kitchen, sustaining a displaced intertrochanteric fracture of the left hip.She lives with her son. She has atrial fibrillation without anticoagulation secondary to frequent falls and risk of bleeding. On 03/22/2014 RN noted that the pt was unable to swallow medications and a Code Stroke was initiated. She was later noted to have slurred speech, left facial droop, and an inability to move her left arm, and a right gaze preference. CT shows acute infarct in the right basal ganglia and right parietal white matter.  Repeat MBS ordered to determine readiness for diet advancement.    Type of Study Modified Barium Swallowing Study   Reason for Referral Objectively evaluate swallowing function   Previous Swallow Assessment 10/8 MBSS D2/NTL   Diet Prior to this Study Nectar-thick liquids;Dysphagia 3 (soft)  puree meat   Temperature Spikes Noted No   Respiratory Status Room air   History of Recent Intubation No   Behavior/Cognition Alert;Requires cueing;Cooperative    Oral Cavity - Dentition Dentures, top;Dentures, bottom   Oral Motor / Sensory Function Impaired - see Bedside swallow eval   Self-Feeding Abilities Able to feed self   Patient Positioning Upright in chair   Baseline Vocal Quality Clear   Volitional Cough Strong   Volitional Swallow Able to elicit   Anatomy Within functional limits   Pharyngeal Secretions Not observed secondary MBS            Oral Preparation/Oral Phase - 05/29/14 1350    Oral Preparation/Oral Phase   Oral Phase Impaired   Oral - Solids   Oral - Puree Within functional limits   Oral - Mechanical Soft Not tested   Oral - Regular Weak lingual manipulation;Left pocketing in lateral sulci;Lingual/palatal residue;Delayed oral transit   Electrical stimulation - Oral Phase   Was Electrical Stimulation Used No          Pharyngeal Phase - 05/29/14 1351    Pharyngeal Phase   Pharyngeal Phase Impaired   Pharyngeal - Honey   Pharyngeal - Honey Cup Not tested   Pharyngeal - Nectar   Pharyngeal - Nectar Cup Delayed swallow initiation;Premature spillage to valleculae;Reduced epiglottic inversion;Reduced anterior laryngeal mobility;Reduced laryngeal elevation   Penetration/Aspiration details (nectar cup) Material does not enter airway   Pharyngeal - Thin   Pharyngeal - Thin Cup Delayed swallow initiation;Premature spillage to valleculae;Premature spillage to pyriform sinuses;Reduced epiglottic inversion;Reduced anterior laryngeal mobility;Reduced laryngeal elevation;Reduced airway/laryngeal closure;Penetration/Aspiration during swallow;Pharyngeal residue - valleculae;Lateral channel residue;Other (Comment)  residuals in laryngeal vestibule/underepiglottic coating   Penetration/Aspiration details (thin cup) Material enters airway, remains ABOVE vocal cords then ejected out;Material enters airway, remains ABOVE vocal cords and not ejected out;Material enters airway, CONTACTS cords then ejected out  possibly contacted cords x1    Pharyngeal - Solids   Pharyngeal - Puree Within functional limits   Pharyngeal - Mechanical Soft Not tested   Pharyngeal - Regular Within functional limits   Pharyngeal - Pill Within functional limits  when taken whole in puree   Electrical Stimulation - Pharyngeal Phase   Was Electrical Stimulation Used No          Cricopharyngeal Phase - 05/29/14 1359    Cervical Esophageal Phase   Cervical Esophageal Phase [facility]          Recommendations/Treatment - 05/29/14 1359    Swallow Evaluation Recommendations   Diet Recommendations Dysphagia 3 (Mechanical Soft);Thin liquid   Liquid Administration via Cup   Medication Administration Whole meds with puree   Supervision Full supervision/cueing for compensatory strategies  Compensations Slow rate;Small sips/bites;Multiple dry swallows after each bite/sip;Clear throat after each swallow;Hard cough after swallow  throat clear/cough after liquids   Postural Changes and/or Swallow Maneuvers Seated upright 90 degrees;Upright 30-60 min after meal   Oral Care Recommendations Oral care BID   Follow up Recommendations Skilled Nursing facility          Prognosis - 05/29/14 1400    Prognosis   Prognosis for Safe Diet Advancement Good   Individuals Consulted   Consulted and Agree with Results and Recommendations Patient;Family member/caregiver   Family Member Consulted Son and treating SLP   Report Sent to  Referring physician;Facility (Comment)        Thank you,  Genene Churn, [place]   [place] 05/29/2014, 3:06 PM

## 2014-05-29 NOTE — Progress Notes (Signed)
Patient ID: Erin [name], female   DOB: 03/04/1930, 80 y.o.   MRN: [id number]   This is an acute visit.  Level care skilled.  Facility [facility].  Chief complaint acute visit secondary to cough.  History of present illness  Patient is a pleasant 80 year old female here for rehabilitation after sustaining a left hip fracture that was repaired.  She also has a history of a CVA that was diagnosed during her hospitalization as well.  She recently completed an IV antibiotic for an elevated white count along with bacteremia-white count has since normalized she has been afebrile.  She did have a modified barium swallow earlier today I suspect for dietary upgrade she appeared do well with this although I only see the preliminary results.  She is developed a cough productive of clear phlegm and I am seeing her for this--  She has been afebrile does not complain of any increased shortness of breath.  Family medical social history has been reviewed including progress note of 05/14/2014.  Medications have been reviewed per MAR.  Review of systems.  In general no complaints of fever or chills.  Respiratory does not complain of shortness of breath she does have a cough productive of clear phlegm.  Cardiac a history of atrial fibrillation but no complaints of chest pain she appears to have her chronic lower extremity edema venous stasis changes.  GI does not complain of any nausea vomiting diarrhea or constipation or abdominal discomfort.  Neurologic history of CVA but does not complaining of any dizziness headache or syncopal-type feelings.  Physical exam.  Temperature is 97.9 pulse 76 respirations 20 blood pressure 120/62.  In general this is a pleasant elderly female in no distress sitting comfortably in her wheelchair.  Her skin is warm and dry.  Chest is clear to auscultation there is no labored breathing.  Heart is irregular irregular rate and rhythm-she appears to have  venous stasis changes chronic lower extremity edema at baseline.  Head ears eyes nose mouth and throat oropharynx is clear mucous membranes moist she does have some clear nasal drainage.  Labs.  05/15/2014.  WBC 6.9 hemoglobin 9.4 platelets 248.  Sodium 145 potassium 3.7 BUN 7 creatinine 0.48 liver function tests within normal limits.  Except albumin of 2.6.  Assessment plan.  #1-cough-will treat with Mucinex 600 mg twice a day for 5 days-also monitor vital signs pulse ox every shift for 72 hours.  This appears possibly to be a cold type symptom presentation but will have to be monitored closely at this point she does appear to be stable.  Of note will like to update a CBC differential and metabolic panel tomorrow as well.  CPT code [phone]  .

## 2014-05-30 ENCOUNTER — Other Ambulatory Visit: Payer: Self-pay | Admitting: *Deleted

## 2014-05-30 MED ORDER — LORAZEPAM 0.5 MG PO TABS: ORAL_TABLET | ORAL | Status: DC

## 2014-05-30 NOTE — Telephone Encounter (Signed)
[facility] 

## 2014-06-04 ENCOUNTER — Ambulatory Visit: Payer: Medicare Other | Admitting: Family Medicine

## 2014-06-07 ENCOUNTER — Inpatient Hospital Stay (HOSPITAL_COMMUNITY)
Admit: 2014-06-07 | Discharge: 2014-06-07 | Disposition: A | Discharge status: Home / Self Care | Payer: Medicare PPO | Attending: Internal Medicine | Admitting: Internal Medicine

## 2014-06-11 ENCOUNTER — Other Ambulatory Visit: Payer: Self-pay | Admitting: Thoracic Surgery (Cardiothoracic Vascular Surgery)

## 2014-06-11 ENCOUNTER — Encounter: Payer: Medicare PPO | Admitting: Thoracic Surgery (Cardiothoracic Vascular Surgery)

## 2014-06-11 DIAGNOSIS — I639 Cerebral infarction, unspecified: Secondary | ICD-10-CM

## 2014-06-11 NOTE — Progress Notes (Signed)
This encounter was created in error - please disregard.

## 2014-06-12 ENCOUNTER — Telehealth: Payer: Self-pay | Admitting: *Deleted

## 2014-06-25 ENCOUNTER — Encounter: Payer: Self-pay | Admitting: Internal Medicine

## 2014-06-25 ENCOUNTER — Non-Acute Institutional Stay (SKILLED_NURSING_FACILITY): Payer: Medicare Other | Admitting: Internal Medicine

## 2014-06-25 DIAGNOSIS — G252 Other specified forms of tremor: Secondary | ICD-10-CM

## 2014-06-25 DIAGNOSIS — I639 Cerebral infarction, unspecified: Secondary | ICD-10-CM

## 2014-06-25 DIAGNOSIS — I482 Chronic atrial fibrillation, unspecified: Secondary | ICD-10-CM

## 2014-06-25 DIAGNOSIS — S72142D Displaced intertrochanteric fracture of left femur, subsequent encounter for closed fracture with routine healing: Secondary | ICD-10-CM

## 2014-06-25 DIAGNOSIS — R251 Tremor, unspecified: Secondary | ICD-10-CM

## 2014-06-25 DIAGNOSIS — G250 Essential tremor: Secondary | ICD-10-CM

## 2014-06-25 NOTE — Progress Notes (Signed)
Patient ID: Erin [name], female   DOB: [birth year], 80 y.o.   MRN: [id number]   This is a discharge note.  Level care skilled.  Facility [facility].  Chief complaint discharge note.  History of present illness.  Patient is a pleasant 80 year old female with a history of possible Parkinson's disease followed by neurology with frequent falls and atrial fibrillation.  In early September she presented to the hospital with a left hip fracture which was surgically repaired-CT scan showed right-sided infarct-as well --this was thought to be embolic-she is on Eliquis for anticoagulation.  Shehas done fairly well here and appears to have gained some strength on her left side-she is actually able now to lift her left arm which was not the case when she first came here-still has some strength deficits on the left versus the right  but this has improved-she will be going home with 24-hour care and will need a wheelchair.  Her stay here was remarkable for significant leukocytosis with a white count of over 18,000.  She was found to have bacteremia-she was treated with IV antibiotic and this resolved quite unremarkably--of note she did have a PICC line removed.  Her vital signs continue to be sta Previous medical history.  Embolic infarctions in the right hemisphere 2.  Left hip fracture.  Hypertension.  Atrial fibrillation  Dementia.  Anxiety  Family medical social history reviewed per admission note on 04/28/2014.  Social history-patient was previously living at home with her son.  Review of systems.  In general does not complain of fever or chills.  Skin does not complain of rashes or itching.  Head ears eyes nose mouth and throat-no complaints of nasal discharge sore throat or visual changes.  Respiratory does not complain of shortness of breath.  Cardiac does not complain of chest painhas mild lower extremity edema does have a history of atrial fibrillation.  GI does  not complain of abdominal discomfort nausea or vomiting diarrhea or constipation.  GU denies dysuria-apparently one point had an issue with urinary retention but this appears to have stabilized.  Musculoskeletal status post left hip fracture with repair at this point pain appears controlled on oxycodone per patient.--does complain at times of some left arm discomfort but oxycodone again helped  Neurologic does have a history of tremors this is followed by neurology.  Physical exam.  Denture 98.1 pulse 92 respirations 18 blood pressure 108/69 O2 saturation has been in the 90s area  In general this is a somewhat frail early female appears significant stronger however over the course of her stay.  Her skin is warm and dry.  Oropharynx is clear mucous membranes moist.  Eyes visual acuity appears grossly intact pupils are reactive short-term movements appear intact.  Chest is clear to auscultation there is somewhat shallow air entry no labored breathing.  Heart is irregular irregular rate and rhythm without murmur gallop or rub-she appears to have some mild venous stasis changes edema lower extremities at baseline.  Abdomen is soft nontender positive bowel sounds.  Musculoskeletal-is able to move all extremities 4 continued with left-sided weakness most  Noticeable  left upper extremity however she is able to lift her arm now and actually scratch the back of her head which is encouraging-she keeps her left arm elevated there is some slight edema hand area which is chronic.  Neurologic as stated above her speech is clear--has a very mild tremor with movement of her right arm but this appears to have improved.  Psych  she appears alert and oriented pleasant and appropriate  .  Labs.  06/10/2014.  Sodium 144 potassium 3.7 BUN 10 creatinine 0.59.  11- 19th 2015.  WBC 6.9 hemoglobin 11.9 platelets 306.  November fourth 2015.  Liver function tests within normal limits except  albumin of 2.6.    .  Medications.  Anusol once a day when necessary.  Aricept 5 mg daily at bedtime.  BuSpar 5 mg twice a day.  Florastor twice a day.  Lipitor 40 mg daily.  Lopressor 25 mg 3 times a day.  Prilosec 40 mg twice a day.  Oxycodone 5 mg-take 2 tabs as needed every 4 hours when necessary pain severe-take 1 tab every 4 hours for moderate pain.  Senokot-S daily at bedtime.  Topamax 25 mg twice a day.  Tylenol  PM Extra Strength 25-500 one tab when necessary daily at bedtime--  Assessment and plan.  #3-JASNKNL of CVA embolic-she is on Eliquis she has made good progress regrading strength but still has limited mobility and requires a wheelchair-will require 24-hour care at home.  #2- atrial fibrillation now on Eliquis she is on Lopressor for rate control this appears to be stable.  #3 history of hyperlipidemia-she is on a statin-this will warrant follow up by primary care provider.  #4-history of tremor-this does not appear to be overtly significant tonight-she is followed by neurology is on Topamax  #5-history of GERD this appears stable on Prilosec.  #6-history of leukocytosis-thought secondary to bacteremia-again this appears to have resolved unremarkably.  #7-history of left hip fracture-again she appears to have progressed well with therapy but still ambulates in a wheelchair again this is complicated with her recent history of CVA  At this point patient appears stable actually is done quite well-will go home with a 24-hour care-also will need a wheelchair-will be followed by [facility]   [id number] note greater than 30 minutes spent on this discharge summary

## 2014-07-02 ENCOUNTER — Encounter (INDEPENDENT_AMBULATORY_CARE_PROVIDER_SITE_OTHER): Payer: Self-pay

## 2014-07-02 ENCOUNTER — Ambulatory Visit (INDEPENDENT_AMBULATORY_CARE_PROVIDER_SITE_OTHER): Payer: Medicare Other | Admitting: Family Medicine

## 2014-07-02 ENCOUNTER — Encounter: Payer: Self-pay | Admitting: Family Medicine

## 2014-07-02 VITALS — BP 130/78 | HR 94 | Resp 16 | Ht 66.0 in | Wt 136.4 lb

## 2014-07-02 DIAGNOSIS — Z23 Encounter for immunization: Secondary | ICD-10-CM

## 2014-07-02 DIAGNOSIS — I639 Cerebral infarction, unspecified: Secondary | ICD-10-CM | POA: Diagnosis not present

## 2014-07-02 DIAGNOSIS — E785 Hyperlipidemia, unspecified: Secondary | ICD-10-CM

## 2014-07-02 DIAGNOSIS — I482 Chronic atrial fibrillation, unspecified: Secondary | ICD-10-CM

## 2014-07-02 DIAGNOSIS — F411 Generalized anxiety disorder: Secondary | ICD-10-CM

## 2014-07-02 DIAGNOSIS — I10 Essential (primary) hypertension: Secondary | ICD-10-CM

## 2014-07-02 DIAGNOSIS — M810 Age-related osteoporosis without current pathological fracture: Secondary | ICD-10-CM | POA: Diagnosis not present

## 2014-07-02 NOTE — Patient Instructions (Signed)
F/u in 2 month, call if you need me before  You are referred to home health, for PT/OT and clinical evaluation as far as blood pressure, skin, nutrition are concerned  You qualify for hospital bed and we will work on this  Use tylenol 325 mg one to two daily for pain  STOP oxycodone as you have already done, do not need this  We will work on getting help with eliqis  Due to cost  Fasting lipid, cmp, cbc by home health nurse  Flu vaccine today  Continue yearly rclast in June for bone building  Appt with your cardiologist will be scheduled for next year

## 2014-07-06 ENCOUNTER — Telehealth: Payer: Self-pay | Admitting: Family Medicine

## 2014-07-06 NOTE — Telephone Encounter (Signed)
Pt's son Linsi Humann [id number]  /[id number] called in on 07/06/2014 requesting script for pain medication , I advised him to call in when office is open and we are able to do further research. Review of med list suggests pt was dispensed 360 tabs in November , pls verify with pharmacy and see Doc who prescribed, call me about this pls. Pt has ortho appt on 01/04 approx per family member who called in, I myself am not inclined to prescribe the this  at the dosage she has been getting  Family member will call in by 11am if he has not heard from office so pls check into this early in am and call me to discuss

## 2014-07-08 ENCOUNTER — Other Ambulatory Visit: Payer: Self-pay | Admitting: *Deleted

## 2014-07-08 ENCOUNTER — Ambulatory Visit (INDEPENDENT_AMBULATORY_CARE_PROVIDER_SITE_OTHER): Payer: Medicare Other

## 2014-07-08 ENCOUNTER — Telehealth: Payer: Self-pay | Admitting: Family Medicine

## 2014-07-08 DIAGNOSIS — Z23 Encounter for immunization: Secondary | ICD-10-CM

## 2014-07-08 DIAGNOSIS — E785 Hyperlipidemia, unspecified: Secondary | ICD-10-CM | POA: Insufficient documentation

## 2014-07-08 MED ORDER — OMEPRAZOLE 20 MG PO CPDR: 20.0000 mg | DELAYED_RELEASE_CAPSULE | Freq: Two times a day (BID) | ORAL | Status: DC

## 2014-07-08 NOTE — Telephone Encounter (Signed)
Received call from [place] stating that patient is taking Tylenol for pain management.   Shortly thereafter patient called stating that she needs a refill on Oxycodone that she was discharged from the [facility] with.  States that she has been in severe pain since Christmas when she ran out.  Please advise.

## 2014-07-08 NOTE — Assessment & Plan Note (Addendum)
Pt now commited to chronic anti coagulant, due to recent thrombo embolic stroke, medication cost is a challenge, will see what help we are able to arrange

## 2014-07-08 NOTE — Telephone Encounter (Signed)
Pls contact pt's son who called over weekend, contact info is in previous note, review of record and office visit negates the need to refill oxycodone, pt and caregivers at recent visit stated Denitra was relying on tylenol for management  Of her pain, and that this was sufficient she also has appt with ortho early in January.(Ok to wait on him to call in also) Let me know if this continues to be a problem Also need to send order for hospital bed to [facility], hopefully info in note is sufficient, if not will further address on my return in 2 dayss \\Also  see if able to get help with her anti coagulant through partnership program please See flagged msg also pls on her

## 2014-07-08 NOTE — Assessment & Plan Note (Addendum)
Reduced left upper and lower extremity strength, pt will benefit from ongoing in home PT/OT and this is already  Arranged Also needs hospital bed due to limitation in mobility as well as heart disease  Pt needs re positioning that cannot be achieved with a regular bed

## 2014-07-08 NOTE — Progress Notes (Signed)
   Subjective:    Patient ID: Erin [name], female    DOB: 08-28-1929, 80 y.o.   MRN: [id number]  HPI Pt in for f/u after sustaining a hip fracture in September, complicated by an embolic stroke from her chronic a fib. She was at the [facility]  Up until the past week and is now at home with home health services. Her grand daughter and a sitter accompany her Pt fells fairly well though still weak and not as mobile as she had been in the past Denies pain and has not been on pain medication other than tylenol per report of her sitter and grand daughter Requests a hospital bed, and also concerned about the cost of her blood thinner, will need to get help from [facility] for community meds about this   Review of Systems See HPI Denies recent fever or chills.c/o poor appetite and generalized weakness. Concerned about ability to swallow safely , so I recommend that her food be pureed Denies sinus pressure, nasal congestion, ear pain or sore throat. Denies chest congestion, productive cough or wheezing. Denies chest pains, palpitations and leg swelling Denies abdominal pain, nausea, vomiting,diarrhea or constipation.   Denies dysuria, frequency, hesitancy or incontinence. c/o joint pain,  and limitation in mobility. Denies headaches, seizures, numbness, or tingling. Denies depression, does report some anxiety and mild  insomnia. Denies skin break down or rash.        Objective:   Physical Exam BP 130/78 mmHg  Pulse 94  Resp 16  Ht 5\' 6"  (1.676 m)  Wt 136 lb 6.4 oz (61.871 kg)  BMI 22.03 kg/m2  SpO2 96% Patient alert and oriented and in no cardiopulmonary distress.  HEENT: No facial asymmetry, EOMI,   oropharynx pink and moist.  Neck supple no JVD, no mass.  Chest: Clear to auscultation bilaterally.  CVS: S1, S2 no murmurs, no S3.Regular rate.  ABD: Soft non tender.   Ext: No edema  MS: Adequate though reduced  ROM spine, shoulders, hips and knees. Wheelchair restricted as  far as safe mobility at this time  Skin: Intact, no ulcerations or rash noted.  Psych: Good eye contact, normal affect. Memory intact not anxious or depressed appearing.  CNS: CN 2-12 intact, power,  Grade 3 in left upper and lower extremities        Assessment & Plan:  Cardioembolic stroke Reduced left upper and lower extremity strength, pt will benefit from ongoing in home PT/OT and this is already  Arranged Also needs hospital bed due to limitation in mobility as well as heart disease  Chronic atrial fibrillation Pt now commited to chronic anti coagulant, due to recent thrombo embolic stroke, medication cost is a challenge, will see what help we are able to arrange  Osteoporosis, senile Continue treatment  Anxiety state Did c/o increased anxiety at end of visit, maintain current buspar dose , but low thresh hold to increasing this to 3 times daily, hopefully, as she re settles in her home her anxiety will lessen  Hyperlipidemia LDL goal <70 Uncontrolled Hyperlipidemia:Low fat diet discussed and encouraged.  Updated lab needed at/ before next visit.   Need for prophylactic vaccination and inoculation against influenza Vaccine administered at visit.    Essential hypertension, benign Controlled, no change in medication

## 2014-07-08 NOTE — Telephone Encounter (Signed)
There is confusion in the reporting. Pls verify the appt date with ortho, who will be responsible for chronic pain mx , ( I think Jan 4??) Explain to Kayani and ask for [facility] help also, max I will rx is 2 daily, I still cannot understand if she really collected over 300 from pharmacy at time of d/c  Would appreciate you calling the pharmacy to verify/otherwise, continue the conversation in am

## 2014-07-08 NOTE — Assessment & Plan Note (Signed)
Controlled, no change in medication  

## 2014-07-08 NOTE — Assessment & Plan Note (Signed)
Uncontrolled. Hyperlipidemia:Low fat diet discussed and encouraged.  Updated lab needed at/ before next visit.  

## 2014-07-08 NOTE — Assessment & Plan Note (Signed)
Vaccine administered at visit.  

## 2014-07-08 NOTE — Assessment & Plan Note (Signed)
Did c/o increased anxiety at end of visit, maintain current buspar dose , but low thresh hold to increasing this to 3 times daily, hopefully, as she re settles in her home her anxiety will lessen

## 2014-07-08 NOTE — Assessment & Plan Note (Signed)
Continue treatment

## 2014-07-09 ENCOUNTER — Telehealth: Payer: Self-pay | Admitting: Neurology

## 2014-07-09 NOTE — Telephone Encounter (Signed)
Spoke to [place] and will call her in morning at [phone].

## 2014-07-09 NOTE — Telephone Encounter (Signed)
Pt just came home from Nursing home following a stroke. She has had some changes and the caseworker wants to know if she should be seen sooner than March.  Please call and advise.

## 2014-07-10 ENCOUNTER — Other Ambulatory Visit: Payer: Self-pay | Admitting: Family Medicine

## 2014-07-10 MED ORDER — MIRTAZAPINE 30 MG PO TABS: 30.0000 mg | ORAL_TABLET | Freq: Every day | ORAL | Status: DC

## 2014-07-10 MED ORDER — HYDROCODONE-ACETAMINOPHEN 5-325 MG PO TABS: ORAL_TABLET | ORAL | Status: DC

## 2014-07-10 NOTE — Telephone Encounter (Signed)
pls  printpain med at same strength for twice daily only duration up until the day of her next visit to orthopedics which is early January,

## 2014-07-10 NOTE — Telephone Encounter (Signed)
Spoke to Erin [name] who is patient's case worker with [facility].  She relayed her concerns since the patient returned home from [facility] rehab.  The patient is not sleeping at all and is not eating.  The patient has openly expressed how depressed she was and has a lot of anxiety.  She has lost 30 pounds since the stroke.  She has been in touch with the PCP but she is not comfortable with treating, if its effects of stroke.  She also was taking Alleve and Advil with her Eliquis but has been told to stop the OTC pain relievers, she has chronic back pain.  Case worker wanted to know if she should be seen sooner than March by neurologist.  Delrae Rend case worker can be reached at [phone] if needed.

## 2014-07-10 NOTE — Telephone Encounter (Signed)
I have printed a 10 day supply, needs to be collected, explain dosing to pt and caregiver and the fact that script written to cover her till she sees ortho, pls verify that ortho appt is noo later than Jan 8 if it is will need to rewrite script so that it lasts

## 2014-07-10 NOTE — Telephone Encounter (Signed)
I called Erin [name], the patient is having a lot of depression, anxiety, insomnia, anorexia. She has lost a lot of weight, she is not sleeping well at night. I will call in Remeron at night for sleep, as an appetite booster, and for anxiety and depression. I will have revisit set up in about 3 weeks.

## 2014-07-10 NOTE — Telephone Encounter (Signed)
Called and left message for discharging nurse at [facility] to return call as to if patient was discharged with pain medication

## 2014-07-10 NOTE — Telephone Encounter (Signed)
Patient aware and will have family member to collect script

## 2014-07-10 NOTE — Telephone Encounter (Signed)
Script ready to collect and caregiver aware

## 2014-07-11 ENCOUNTER — Ambulatory Visit: Payer: Medicare PPO | Admitting: Family Medicine

## 2014-07-11 NOTE — Telephone Encounter (Signed)
Spoke to patient and she is scheduled for revisit on 07-25-13.  I also spoke to case worker and she wanted Dr. Jannifer Franklin to know that patient's PCP has given her Vicodin for pain.

## 2014-07-14 ENCOUNTER — Encounter (HOSPITAL_COMMUNITY): Payer: Self-pay | Admitting: *Deleted

## 2014-07-14 ENCOUNTER — Observation Stay (HOSPITAL_COMMUNITY)
Admission: EM | Admit: 2014-07-14 | Discharge: 2014-07-15 | Disposition: A | Discharge status: Home / Self Care | Payer: Medicare Other | Attending: Internal Medicine | Admitting: Internal Medicine

## 2014-07-14 ENCOUNTER — Emergency Department (HOSPITAL_COMMUNITY): Payer: Medicare Other

## 2014-07-14 DIAGNOSIS — G2581 Restless legs syndrome: Secondary | ICD-10-CM | POA: Insufficient documentation

## 2014-07-14 DIAGNOSIS — M1990 Unspecified osteoarthritis, unspecified site: Secondary | ICD-10-CM | POA: Diagnosis not present

## 2014-07-14 DIAGNOSIS — M810 Age-related osteoporosis without current pathological fracture: Secondary | ICD-10-CM | POA: Diagnosis not present

## 2014-07-14 DIAGNOSIS — G4730 Sleep apnea, unspecified: Secondary | ICD-10-CM | POA: Diagnosis not present

## 2014-07-14 DIAGNOSIS — K228 Other specified diseases of esophagus: Secondary | ICD-10-CM | POA: Insufficient documentation

## 2014-07-14 DIAGNOSIS — K829 Disease of gallbladder, unspecified: Secondary | ICD-10-CM | POA: Insufficient documentation

## 2014-07-14 DIAGNOSIS — Z7902 Long term (current) use of antithrombotics/antiplatelets: Secondary | ICD-10-CM | POA: Diagnosis not present

## 2014-07-14 DIAGNOSIS — Z9981 Dependence on supplemental oxygen: Secondary | ICD-10-CM | POA: Diagnosis not present

## 2014-07-14 DIAGNOSIS — R29898 Other symptoms and signs involving the musculoskeletal system: Secondary | ICD-10-CM | POA: Diagnosis present

## 2014-07-14 DIAGNOSIS — I10 Essential (primary) hypertension: Secondary | ICD-10-CM | POA: Insufficient documentation

## 2014-07-14 DIAGNOSIS — E669 Obesity, unspecified: Secondary | ICD-10-CM | POA: Diagnosis not present

## 2014-07-14 DIAGNOSIS — I482 Chronic atrial fibrillation: Secondary | ICD-10-CM

## 2014-07-14 DIAGNOSIS — Z8673 Personal history of transient ischemic attack (TIA), and cerebral infarction without residual deficits: Secondary | ICD-10-CM | POA: Diagnosis not present

## 2014-07-14 DIAGNOSIS — E785 Hyperlipidemia, unspecified: Secondary | ICD-10-CM | POA: Diagnosis not present

## 2014-07-14 DIAGNOSIS — I4891 Unspecified atrial fibrillation: Secondary | ICD-10-CM | POA: Diagnosis present

## 2014-07-14 DIAGNOSIS — F419 Anxiety disorder, unspecified: Secondary | ICD-10-CM | POA: Insufficient documentation

## 2014-07-14 DIAGNOSIS — Z79899 Other long term (current) drug therapy: Secondary | ICD-10-CM | POA: Insufficient documentation

## 2014-07-14 DIAGNOSIS — K219 Gastro-esophageal reflux disease without esophagitis: Secondary | ICD-10-CM | POA: Insufficient documentation

## 2014-07-14 DIAGNOSIS — I071 Rheumatic tricuspid insufficiency: Secondary | ICD-10-CM | POA: Insufficient documentation

## 2014-07-14 DIAGNOSIS — G5602 Carpal tunnel syndrome, left upper limb: Secondary | ICD-10-CM | POA: Insufficient documentation

## 2014-07-14 DIAGNOSIS — R531 Weakness: Secondary | ICD-10-CM | POA: Diagnosis present

## 2014-07-14 DIAGNOSIS — Z7401 Bed confinement status: Secondary | ICD-10-CM

## 2014-07-14 DIAGNOSIS — H9190 Unspecified hearing loss, unspecified ear: Secondary | ICD-10-CM | POA: Insufficient documentation

## 2014-07-14 DIAGNOSIS — I639 Cerebral infarction, unspecified: Secondary | ICD-10-CM

## 2014-07-14 DIAGNOSIS — G9340 Encephalopathy, unspecified: Secondary | ICD-10-CM | POA: Diagnosis not present

## 2014-07-14 DIAGNOSIS — F0391 Unspecified dementia with behavioral disturbance: Secondary | ICD-10-CM

## 2014-07-14 DIAGNOSIS — F03918 Unspecified dementia, unspecified severity, with other behavioral disturbance: Secondary | ICD-10-CM | POA: Diagnosis present

## 2014-07-14 LAB — COMPREHENSIVE METABOLIC PANEL
ALT: 27 U/L (ref 0–35)
ANION GAP: 5 (ref 5–15)
AST: 41 U/L — ABNORMAL HIGH (ref 0–37)
Albumin: 3.5 g/dL (ref 3.5–5.2)
Alkaline Phosphatase: 158 U/L — ABNORMAL HIGH (ref 39–117)
BILIRUBIN TOTAL: 0.7 mg/dL (ref 0.3–1.2)
BUN: 17 mg/dL (ref 6–23)
CHLORIDE: 111 meq/L (ref 96–112)
CO2: 29 mmol/L (ref 19–32)
CREATININE: 0.73 mg/dL (ref 0.50–1.10)
Calcium: 9 mg/dL (ref 8.4–10.5)
GFR calc non Af Amer: 77 mL/min — ABNORMAL LOW (ref >90)
GFR, EST AFRICAN AMERICAN: 90 mL/min — AB (ref >90)
GLUCOSE: 105 mg/dL — AB (ref 70–99)
POTASSIUM: 3.3 mmol/L — AB (ref 3.5–5.1)
Sodium: 145 mmol/L (ref 135–145)
Total Protein: 6.3 g/dL (ref 6.0–8.3)

## 2014-07-14 LAB — URINALYSIS, ROUTINE W REFLEX MICROSCOPIC
BILIRUBIN URINE: NEGATIVE
GLUCOSE, UA: NEGATIVE mg/dL
HGB URINE DIPSTICK: NEGATIVE
Ketones, ur: 15 mg/dL — AB
Nitrite: NEGATIVE
PH: 7 (ref 5.0–8.0)
Protein, ur: NEGATIVE mg/dL
Specific Gravity, Urine: 1.015 (ref 1.005–1.030)
Urobilinogen, UA: 0.2 mg/dL (ref 0.0–1.0)

## 2014-07-14 LAB — CBC
HCT: 38.3 % (ref 36.0–46.0)
Hemoglobin: 12.4 g/dL (ref 12.0–15.0)
MCH: 32.4 pg (ref 26.0–34.0)
MCHC: 32.4 g/dL (ref 30.0–36.0)
MCV: 100 fL (ref 78.0–100.0)
PLATELETS: 275 10*3/uL (ref 150–400)
RBC: 3.83 MIL/uL — ABNORMAL LOW (ref 3.87–5.11)
RDW: 14.1 % (ref 11.5–15.5)
WBC: 8.6 10*3/uL (ref 4.0–10.5)

## 2014-07-14 LAB — URINE MICROSCOPIC-ADD ON

## 2014-07-14 LAB — RAPID URINE DRUG SCREEN, HOSP PERFORMED
AMPHETAMINES: NOT DETECTED
Barbiturates: NOT DETECTED
Benzodiazepines: POSITIVE — AB
COCAINE: NOT DETECTED
Opiates: POSITIVE — AB
Tetrahydrocannabinol: NOT DETECTED

## 2014-07-14 LAB — PROTIME-INR
INR: 1.54 — AB (ref 0.00–1.49)
PROTHROMBIN TIME: 18.6 s — AB (ref 11.6–15.2)

## 2014-07-14 LAB — ETHANOL

## 2014-07-14 LAB — MRSA PCR SCREENING: MRSA by PCR: POSITIVE — AB

## 2014-07-14 LAB — DIFFERENTIAL
BASOS PCT: 1 % (ref 0–1)
Basophils Absolute: 0 10*3/uL (ref 0.0–0.1)
EOS ABS: 0.6 10*3/uL (ref 0.0–0.7)
Eosinophils Relative: 7 % — ABNORMAL HIGH (ref 0–5)
Lymphocytes Relative: 23 % (ref 12–46)
Lymphs Abs: 2 10*3/uL (ref 0.7–4.0)
MONO ABS: 0.6 10*3/uL (ref 0.1–1.0)
Monocytes Relative: 7 % (ref 3–12)
NEUTROS PCT: 62 % (ref 43–77)
Neutro Abs: 5.4 10*3/uL (ref 1.7–7.7)

## 2014-07-14 LAB — TROPONIN I

## 2014-07-14 LAB — APTT: aPTT: 35 seconds (ref 24–37)

## 2014-07-14 MED ORDER — POTASSIUM CHLORIDE 10 MEQ/100ML IV SOLN
10.0000 meq | Freq: Once | INTRAVENOUS | Status: AC
  Administered 2014-07-14: 10 meq via INTRAVENOUS
  Filled 2014-07-14: qty 100

## 2014-07-14 MED ORDER — ONDANSETRON HCL 4 MG/2ML IJ SOLN: 4.0000 mg | Freq: Four times a day (QID) | INTRAMUSCULAR | Status: DC | PRN

## 2014-07-14 MED ORDER — MUPIROCIN 2 % EX OINT
1.0000 "application " | TOPICAL_OINTMENT | Freq: Two times a day (BID) | CUTANEOUS | Status: DC
  Administered 2014-07-14 – 2014-07-15 (×2): 1 via NASAL
  Filled 2014-07-14: qty 22

## 2014-07-14 MED ORDER — CHLORHEXIDINE GLUCONATE CLOTH 2 % EX PADS
6.0000 | MEDICATED_PAD | Freq: Every day | CUTANEOUS | Status: DC
  Administered 2014-07-15: 6 via TOPICAL

## 2014-07-14 MED ORDER — SODIUM CHLORIDE 0.9 % IV SOLN
INTRAVENOUS | Status: DC
  Administered 2014-07-14: 16:00:00 via INTRAVENOUS

## 2014-07-14 MED ORDER — NALOXONE HCL 1 MG/ML IJ SOLN
0.5000 mg | Freq: Once | INTRAMUSCULAR | Status: AC
  Administered 2014-07-14: 0.5 mg via INTRAVENOUS

## 2014-07-14 MED ORDER — ONDANSETRON HCL 4 MG PO TABS: 4.0000 mg | ORAL_TABLET | Freq: Four times a day (QID) | ORAL | Status: DC | PRN

## 2014-07-14 MED ORDER — SODIUM CHLORIDE 0.9 % IJ SOLN
3.0000 mL | Freq: Two times a day (BID) | INTRAMUSCULAR | Status: DC
  Administered 2014-07-15: 3 mL via INTRAVENOUS

## 2014-07-14 MED ORDER — NALOXONE HCL 1 MG/ML IJ SOLN
INTRAMUSCULAR | Status: AC
  Filled 2014-07-14: qty 2

## 2014-07-14 MED ORDER — SODIUM CHLORIDE 0.9 % IV SOLN: INTRAVENOUS | Status: AC

## 2014-07-14 NOTE — H&P (Signed)
History and Physical  Erin [name]:[id number] DOB: August 24, 1929 DOA: 07/14/2014  Referring physician: Hoover Brunette, ER physician PCP: Tula Nakayama, MD   Chief Complaint: Altered mental status  HPI: Erin [name] is a 81 y.o. female  With past medical history of atrial fibrillation on chronic anticoagulation plus previous history of CVA who was in her usual state of health last night and then this morning family noted that patient was less responsive, drowsy with slurred speech and unable to walk. They were worried that she had a new stroke and he brought her into the emergency room. CT scan was unrevealing for anything acute. Urine drug screen noted positive for opiates and benzodiazepines, both of which the patient is on currently. Patient received 1 dose of Narcan in the emergency room with little effect. Lab work was otherwise unrevealing. Hospitalists were called for further evaluation and workup.  In discussion with the son, he clarified that he is going to get her medications to her and she has to take them with applesauce. He is therefore impossible for her to afford medications or take too many of either benzos or opiates   Review of Systems:  Patient seen after arrival to floor. Pt complains of some left leg pain below the knee. She is someone somnolent so it is difficult to get her a full review of systems from her, but she denies any headache, vision changes, dysphagia, chest pain, palpitations, short of breath, wheeze, cough, abdominal pain, constipation, diarrhea.  Review of systems are otherwise negative  Past Medical History  Diagnosis Date  . Anxiety   . Arthritis   . Hyperlipidemia   . Essential hypertension, benign   . Osteoporosis   . GERD (gastroesophageal reflux disease)   . Hearing loss   . Restless leg   . Tremor   . Leg edema   . Obese   . Gall bladder disease   . Cataract   . Carpal tunnel syndrome of left wrist   . Atrial fibrillation   .  Sleep apnea   . Esophageal dilatation   . Frequent falls   . History of nuclear stress test 08/31/2012    Lexiscan cardiolite negative for ischemia  . Polyneuropathy in other diseases classified elsewhere 10/03/2013  . Cardioembolic stroke 35/00/9381   Past Surgical History  Procedure Laterality Date  . Appendectomy    . Cholecystectomy    . Abdominal hysterectomy    . Fracture surgery      Left arm x4  . Benign cyst removed from kidney and lung, bilateral mastectomy    . Bilateral great toenail removal    . Knee arthroscopy  2012    Right knee, Dr Aline Brochure  . Left forearm    . Breast surgery      Bilateral 2000  . Tonsillectomy    . R hand surgery    . Bronchoscopy  02/15/2000  . Right vats.  "  . Right upper lobe wedge resection.  "  . Upper gastrointestinal endoscopy  11/25/1999    Esophagitis/ Normal proximal esophagus, stomach and duodenum  . Colonoscopy  01/17/2009    Dr. Deatra Ina : Internal hemorrhoids/Diverticula, scattered in the ascending colon/  Moderate diverticulosis ascending colon to sigmoid colon  . Esophagogastroduodenoscopy   04/23/2003    Dr. Deatra Ina: HIATAL HERNIA  . Colonoscopy  01/16/2001    Normal  . Cataract extraction, bilateral    . Esophagogastroduodenoscopy  03/20/2012    WEX:HBZJIRCV ring-LIKELY CAUSING MILD DYSPHAGIA/SMALL hiatal hernia/Multiple sessile  polyps ranging between 3-37mm , path benign  . Cataract extraction    . Carpal tunnel release      Left wrist  . Transthoracic echocardiogram  06/24/2009    EF=>55%, mild assymetric LVH; LA mildly dilated; mild mitral annular calcif, borderline MVP, mild-mod MR; mild-mod TR, RV systolic pressure elevated, mild pulm HTN; AV mildly sclerotic; mild pulm valve regurg - ordered r/t bradycardia   . Endovenous ablation saphenous vein w/ laser  01/2011    Right GSV  . Cholecystectomy    . Intramedullary (im) nail intertrochanteric Left 03/25/2014    Procedure: INTRAMEDULLARY NAIL INTERTROCHANTRIC LEFT HIP;   Surgeon: Marianna Payment, MD;  Location: [facility];  Service: Orthopedics;  Laterality: Left;   Social History:  reports that she has never smoked. She has never used smokeless tobacco. She reports that she does not drink alcohol or use illicit drugs. Patient lives at home with her son and is essentially bedbound  Allergies  Allergen Reactions  . Codeine Hives  . Morphine And Related Itching and Other (See Comments)    Makes pt hallucinate  . Actonel [Risedronate Sodium] Other (See Comments)    Abdominal pain  . Fosamax [Alendronate Sodium] Other (See Comments)    Abdominal pian  . Losartan Other (See Comments)    Hospitalized in 06/2013 with pancreatitis, losartan is associated with increased risk of pancreatitis ,  Hence discontinued  . Nitrofurantoin Other (See Comments)    Hospitalized with pancreatitis in 06/2013. Nitrofurantoin implicated as a possible cause    Family History  Problem Relation Age of Onset  . Diabetes Mother   . Heart disease Mother   . Stroke Mother   . Cancer Sister     KIDNEY  . Emphysema Sister   . Heart disease Brother   . Heart disease Sister   . Emphysema Sister   . Cancer Sister     BREAST  . Heart disease Brother   . Colon cancer Sister   . Alzheimer's disease Father   . Heart disease Sister   . Tremor Sister   . Tremor Brother   . Alcoholism Child   . Cancer Brother   . Pancreatic cancer Brother   . Diabetes Son       Prior to Admission medications   Medication Sig Start Date End Date Taking? Authorizing Provider  ALPRAZolam (XANAX) 0.25 MG tablet Take 0.125 mg by mouth daily as needed for anxiety.   Yes Historical Provider, MD  apixaban (ELIQUIS) 5 MG TABS tablet Take 5 mg by mouth 2 (two) times daily.   Yes Historical Provider, MD  busPIRone (BUSPAR) 5 MG tablet Take 5 mg by mouth 2 (two) times daily.   Yes Historical Provider, MD  donepezil (ARICEPT) 5 MG tablet Take 5 mg by mouth at bedtime.   Yes Historical Provider, MD    HYDROcodone-acetaminophen (NORCO/VICODIN) 5-325 MG per tablet One tablet twice daily for moderately severe pain Patient taking differently: Take 1 tablet by mouth 2 (two) times daily. One tablet twice daily for moderately severe pain 07/10/14  Yes Fayrene Helper, MD  metoprolol (LOPRESSOR) 50 MG tablet Take 25 mg by mouth 3 (three) times daily.   Yes Historical Provider, MD  mirtazapine (REMERON) 30 MG tablet Take 1 tablet (30 mg total) by mouth at bedtime. 07/10/14  Yes Kathrynn Ducking, MD  omeprazole (PRILOSEC) 20 MG capsule Take 1 capsule (20 mg total) by mouth 2 (two) times daily before a meal. Take 1 capsule by  mouth twice daily at 6:30am and 9:00pm. 07/08/14  Yes Lauree Chandler, NP  topiramate (TOPAMAX) 25 MG tablet Take 1 tablet (25 mg total) by mouth 2 (two) times daily. 05/08/14  Yes Kathrynn Ducking, MD    Physical Exam: BP 122/71 mmHg  Pulse 89  Temp(Src) 97.6 F (36.4 C) (Oral)  Resp 20  Wt 61.689 kg (136 lb)  SpO2 100%  General:  Despite slurred speech, patient is able to awaken often interact with me and is appropriate. No acute distress.  She is actually rather oriented, relating her issues with her hip fracture and stroke that followed and that she has a follow-up appointment tomorrow which is correct. Her speech is garbled, but she is also missing her dentures. Eyes: Sclera nonicteric, extraocular movements appear intact ENT: Normocephalic and atraumatic, mucous vitamins are dry Neck: No JVD Cardiovascular: Irregular rhythm, rate controlled Respiratory: Clear to auscultation bilaterally Abdomen: Soft, nontender, nondistended, positive bowel sounds Skin: No skin breaks, tears or lesions, noting patient has bilateral ankle ulcers from chronic bedbound status with wound coverings Musculoskeletal: No clubbing or cyanosis, trace edema bilaterally Psychiatric: Patient is somnolent and groggy, but otherwise appropriate Neurologic: Cranial nerves II through XII appear to  be intact with the exception of a right sided facial droop. She does have left-sided weakness-3 minus/5 for the left leg. And 4 minus/5 for the left arm           Labs on Admission:  Basic Metabolic Panel:  Recent Labs Lab 07/14/14 1144  NA 145  K 3.3*  CL 111  CO2 29  GLUCOSE 105*  BUN 17  CREATININE 0.73  CALCIUM 9.0   Liver Function Tests:  Recent Labs Lab 07/14/14 1144  AST 41*  ALT 27  ALKPHOS 158*  BILITOT 0.7  PROT 6.3  ALBUMIN 3.5   No results for input(s): LIPASE, AMYLASE in the last 168 hours. No results for input(s): AMMONIA in the last 168 hours. CBC:  Recent Labs Lab 07/14/14 1144  WBC 8.6  NEUTROABS 5.4  HGB 12.4  HCT 38.3  MCV 100.0  PLT 275   Cardiac Enzymes:  Recent Labs Lab 07/14/14 1144  TROPONINI <0.03    BNP (last 3 results) No results for input(s): PROBNP in the last 8760 hours. CBG: No results for input(s): GLUCAP in the last 168 hours.  Radiological Exams on Admission: Ct Head Wo Contrast  07/14/2014   CLINICAL DATA:  Broad from home by ambulance. Right-sided weakness. Altered mental status.  EXAM: CT HEAD WITHOUT CONTRAST  TECHNIQUE: Contiguous axial images were obtained from the base of the skull through the vertex without intravenous contrast.  COMPARISON:  MRI 03/23/2014.  CT 03/22/2014.  FINDINGS: The brain shows generalized atrophy. There is old infarction in the right middle cerebral artery territory affecting the posterior frontal and parietal region which has progressed to atrophy and encephalomalacia. No sign of acute infarction, mass lesion, hemorrhage, hydrocephalus or extra-axial collection. The calvarium is unremarkable. Sinuses, middle ears and mastoids are clear.  IMPRESSION: No acute finding. Old infarction in the right middle cerebral artery territory.  These results were called by telephone at the time of interpretation on 07/14/2014 at 12:13 pm to Dr. Orlie Dakin , who verbally acknowledged these results.    Electronically Signed   By: Nelson Chimes M.D.   On: 07/14/2014 12:13    EKG: Independently reviewed. A-fib, rate controlled  Assessment/Plan Present on Admission:  . Acute encephalopathy with recurrence of left arm weakness: Principal  problem. It's possible this could be an acute CVA or over somnolence which has led to stronger presentation of previous stroke. Urine tox screen noted for benzos and opiates as patient is on and this could be overmedication. Did receive 1 dose of Narcan in emergency room with little benefit. Would favor monitoring with IV fluids, nothing by mouth and holding further narcotics and benzos to see if she wakes up. we'll order MRI. Could be encephalomalacia from old stroke, although the timeframe is usually much longer  . Obese: Patient is criteria with BMI greater than 30  . Atrial fibrillation,: rate controlled, normally on eliquis  . Sleep apnea . Senile dementia with behavioral disturbance: Holding Aricept Bedbound status  Consultants: None  Code Status: -DO NOT RESUSCITATE  Family Communication: Son at the bedside   Disposition Plan: Hopefully patient will return back to baseline by tomorrow and if so, can be discharged home  Time spent: 35 minutes  [facility] Pager [phone]

## 2014-07-14 NOTE — ED Notes (Addendum)
Pt brought in from home by EMS. Pt was found drooping to her right side at 0900 today, pupils are pinpoint, pt is alert and oriented x4. Last known normal was 1030 pm on 07/13/14.  EMS states pt took at vicodin at 0500 this am.

## 2014-07-14 NOTE — ED Provider Notes (Signed)
CSN: [id number]     Arrival date & time 07/14/14  1059 History  This chart was scribed for Orlie Dakin, MD by Edison Simon, ED Scribe. This patient was seen in room APA09/APA09 and the patient's care was started at 11:20 AM.    [place] Chief Complaint  Patient presents with  . Cerebrovascular Accident   The history is provided by the patient and a relative. No language interpreter was used.    HPI Comments: Erin [name] is a 81 y.o. female with history of CVA with residual left-sided deficits who presents to the Emergency Department complaining of altered mental status since waking at 0530 this morning. Family noted her to be drowsy and less responsive this morning with slurred speech and unable to ambulate. She was last normal at 10:30 PM last night when going to bed Family states she woke her son this morning to ask for a Vicodin tablet which she uses for back pain and left arm pain,when they noticed she was less responsive than normal. Per CNA friend, she seemed weaker than usual and has had slurred speech. She states that she feels sleepy. Family states she was at her baseline the night before. Daughter notes she fell on 03/20/2014 and fractured her hip; she was prepping for surgery on 03/22/2014 and was diagnosed with CVA at that time. Since her CVA, she has had limited use of her left arm and left-sided facial droop. Per family, she uses a walker and is having therapy. Her hip was repaired. She lives at home. She does not smoke or use alcohol. EMS performed CBG which was 139. No treatment prior to coming here   Past Medical History  Diagnosis Date  . Anxiety   . Arthritis   . Hyperlipidemia   . Essential hypertension, benign   . Osteoporosis   . GERD (gastroesophageal reflux disease)   . Hearing loss   . Restless leg   . Tremor   . Leg edema   . Obese   . Gall bladder disease   . Cataract   . Carpal tunnel syndrome of left wrist   . Atrial  fibrillation   . Sleep apnea   . Esophageal dilatation   . Frequent falls   . History of nuclear stress test 08/31/2012    Lexiscan cardiolite negative for ischemia  . Polyneuropathy in other diseases classified elsewhere 10/03/2013  . Cardioembolic stroke 09/32/3557   Past Surgical History  Procedure Laterality Date  . Appendectomy    . Cholecystectomy    . Abdominal hysterectomy    . Fracture surgery      Left arm x4  . Benign cyst removed from kidney and lung, bilateral mastectomy    . Bilateral great toenail removal    . Knee arthroscopy  2012    Right knee, Dr Aline Brochure  . Left forearm    . Breast surgery      Bilateral 2000  . Tonsillectomy    . R hand surgery    . Bronchoscopy  02/15/2000  . Right vats.  "  . Right upper lobe wedge resection.  "  . Upper gastrointestinal endoscopy  11/25/1999    Esophagitis/ Normal proximal esophagus, stomach and duodenum  . Colonoscopy  01/17/2009    Dr. Deatra Ina : Internal hemorrhoids/Diverticula, scattered in the ascending colon/  Moderate diverticulosis ascending colon to sigmoid colon  . Esophagogastroduodenoscopy   04/23/2003    Dr. Deatra Ina: HIATAL HERNIA  . Colonoscopy  01/16/2001  Normal  . Cataract extraction, bilateral    . Esophagogastroduodenoscopy  03/20/2012    VEH:MCNOBSJG ring-LIKELY CAUSING MILD DYSPHAGIA/SMALL hiatal hernia/Multiple sessile polyps ranging between 3-66mm , path benign  . Cataract extraction    . Carpal tunnel release      Left wrist  . Transthoracic echocardiogram  06/24/2009    EF=>55%, mild assymetric LVH; LA mildly dilated; mild mitral annular calcif, borderline MVP, mild-mod MR; mild-mod TR, RV systolic pressure elevated, mild pulm HTN; AV mildly sclerotic; mild pulm valve regurg - ordered r/t bradycardia   . Endovenous ablation saphenous vein w/ laser  01/2011    Right GSV  . Cholecystectomy    . Intramedullary (im) nail intertrochanteric Left 03/25/2014    Procedure: INTRAMEDULLARY NAIL  INTERTROCHANTRIC LEFT HIP;  Surgeon: Marianna Payment, MD;  Location: [facility];  Service: Orthopedics;  Laterality: Left;   Family History  Problem Relation Age of Onset  . Diabetes Mother   . Heart disease Mother   . Stroke Mother   . Cancer Sister     KIDNEY  . Emphysema Sister   . Heart disease Brother   . Heart disease Sister   . Emphysema Sister   . Cancer Sister     BREAST  . Heart disease Brother   . Colon cancer Sister   . Alzheimer's disease Father   . Heart disease Sister   . Tremor Sister   . Tremor Brother   . Alcoholism Child   . Cancer Brother   . Pancreatic cancer Brother   . Diabetes Son    History  Substance Use Topics  . Smoking status: Never Smoker   . Smokeless tobacco: Never Used  . Alcohol Use: No   OB History    No data available     Review of Systems  Unable to perform ROS: Mental status change  Neurological: Positive for speech difficulty and weakness.       Altered mental status Drowsy       Allergies  Codeine; Morphine and related; Actonel; Fosamax; Losartan; and Nitrofurantoin  Home Medications   Prior to Admission medications   Medication Sig Start Date End Date Taking? Authorizing Provider  apixaban (ELIQUIS) 5 MG TABS tablet Take 5 mg by mouth 2 (two) times daily.   Yes Historical Provider, MD  busPIRone (BUSPAR) 5 MG tablet Take 5 mg by mouth 2 (two) times daily.   Yes Historical Provider, MD  donepezil (ARICEPT) 5 MG tablet Take 5 mg by mouth at bedtime.   Yes Historical Provider, MD  HYDROcodone-acetaminophen (NORCO/VICODIN) 5-325 MG per tablet One tablet twice daily for moderately severe pain Patient taking differently: Take 1 tablet by mouth 2 (two) times daily. One tablet twice daily for moderately severe pain 07/10/14  Yes Fayrene Helper, MD  metoprolol (LOPRESSOR) 50 MG tablet Take 25 mg by mouth 3 (three) times daily.   Yes Historical Provider, MD  mirtazapine (REMERON) 30 MG tablet Take 1 tablet (30 mg total) by  mouth at bedtime. 07/10/14  Yes Kathrynn Ducking, MD  omeprazole (PRILOSEC) 20 MG capsule Take 1 capsule (20 mg total) by mouth 2 (two) times daily before a meal. Take 1 capsule by mouth twice daily at 6:30am and 9:00pm. 07/08/14  Yes Lauree Chandler, NP  topiramate (TOPAMAX) 25 MG tablet Take 1 tablet (25 mg total) by mouth 2 (two) times daily. 05/08/14  Yes Kathrynn Ducking, MD  witch hazel-glycerin (TUCKS) pad Apply 1 application topically as needed for itching.  Historical Provider, MD   BP 130/47 mmHg  Pulse 85  Temp(Src) 97.6 F (36.4 C) (Oral)  Wt 136 lb (61.689 kg)  SpO2 96% Physical Exam  Constitutional:  Chronically ill-appearing. Arousable to verbal stimulus  HENT:  Head: Normocephalic and atraumatic.  Left-sided mouth droop  Eyes: Conjunctivae and EOM are normal.  Pupils pinpoint bilaterally  Neck: Neck supple. No tracheal deviation present. No thyromegaly present.  No bruit  Cardiovascular: Normal rate and regular rhythm.   No murmur heard. Pulmonary/Chest: Effort normal and breath sounds normal.  Abdominal: Soft. Bowel sounds are normal. She exhibits no distension. There is no tenderness.  Musculoskeletal: Normal range of motion. She exhibits no edema or tenderness.  Neurological: Coordination normal.  Speech is slurred. Opens eyes to verbal stimulus follow simple commands moves all extremities motor strength left arm 3/5 ; left leg3/5; right arm4/5 right leg4/5 DTRs symmetric bilaterally knee jerk ankle jerk biceps toes downward going bilaterally  Skin: Skin is warm and dry. No rash noted.  Psychiatric: She has a normal mood and affect.  Nursing note and vitals reviewed.   ED Course  Procedures (including critical care time)  DIAGNOSTIC STUDIES: Oxygen Saturation is 96% on room air, normal by my interpretation.    COORDINATION OF CARE: 11:32 AM Discussed treatment plan with patient at beside, the patient agrees with the plan and has no further questions at  this time.   Labs Review Labs Reviewed - No data to display  Imaging Review No results found.   EKG Interpretation   Date/Time:  Sunday July 14 2014 11:06:15 EST Ventricular Rate:  89 PR Interval:    QRS Duration: 100 QT Interval:  381 QTC Calculation: 464 R Axis:   75 Text Interpretation:  Atrial fibrillation Borderline repolarization  abnormality No significant change since last tracing Confirmed by  Alexandros Ewan  MD, Dixie Jafri [phone]) on 07/14/2014 11:54:26 AM     She was administered Narcan 0.5 mill grams IV at 11:45 AM without improvement in level of alertness  2 PM patient rates sleepy, arousable to verbal stimulus. Exam is essentially unchanged Results for orders placed or performed during the hospital encounter of 07/14/14  Ethanol  Result Value Ref Range   Alcohol, Ethyl (B) <5 0 - 9 mg/dL  Protime-INR  Result Value Ref Range   Prothrombin Time 18.6 (H) 11.6 - 15.2 seconds   INR 1.54 (H) 0.00 - 1.49  APTT  Result Value Ref Range   aPTT 35 24 - 37 seconds  CBC  Result Value Ref Range   WBC 8.6 4.0 - 10.5 K/uL   RBC 3.83 (L) 3.87 - 5.11 MIL/uL   Hemoglobin 12.4 12.0 - 15.0 g/dL   HCT 38.3 36.0 - 46.0 %   MCV 100.0 78.0 - 100.0 fL   MCH 32.4 26.0 - 34.0 pg   MCHC 32.4 30.0 - 36.0 g/dL   RDW 14.1 11.5 - 15.5 %   Platelets 275 150 - 400 K/uL  Differential  Result Value Ref Range   Neutrophils Relative % 62 43 - 77 %   Neutro Abs 5.4 1.7 - 7.7 K/uL   Lymphocytes Relative 23 12 - 46 %   Lymphs Abs 2.0 0.7 - 4.0 K/uL   Monocytes Relative 7 3 - 12 %   Monocytes Absolute 0.6 0.1 - 1.0 K/uL   Eosinophils Relative 7 (H) 0 - 5 %   Eosinophils Absolute 0.6 0.0 - 0.7 K/uL   Basophils Relative 1 0 - 1 %  Basophils Absolute 0.0 0.0 - 0.1 K/uL  Comprehensive metabolic panel  Result Value Ref Range   Sodium 145 135 - 145 mmol/L   Potassium 3.3 (L) 3.5 - 5.1 mmol/L   Chloride 111 96 - 112 mEq/L   CO2 29 19 - 32 mmol/L   Glucose, Bld 105 (H) 70 - 99 mg/dL   BUN 17 6  - 23 mg/dL   Creatinine, Ser 0.73 0.50 - 1.10 mg/dL   Calcium 9.0 8.4 - 10.5 mg/dL   Total Protein 6.3 6.0 - 8.3 g/dL   Albumin 3.5 3.5 - 5.2 g/dL   AST 41 (H) 0 - 37 U/L   ALT 27 0 - 35 U/L   Alkaline Phosphatase 158 (H) 39 - 117 U/L   Total Bilirubin 0.7 0.3 - 1.2 mg/dL   GFR calc non Af Amer 77 (L) >90 mL/min   GFR calc Af Amer 90 (L) >90 mL/min   Anion gap 5 5 - 15  Urine Drug Screen  Result Value Ref Range   Opiates POSITIVE (A) NONE DETECTED   Cocaine NONE DETECTED NONE DETECTED   Benzodiazepines POSITIVE (A) NONE DETECTED   Amphetamines NONE DETECTED NONE DETECTED   Tetrahydrocannabinol NONE DETECTED NONE DETECTED   Barbiturates NONE DETECTED NONE DETECTED  Urinalysis, Routine w reflex microscopic  Result Value Ref Range   Color, Urine YELLOW YELLOW   APPearance CLOUDY (A) CLEAR   Specific Gravity, Urine 1.015 1.005 - 1.030   pH 7.0 5.0 - 8.0   Glucose, UA NEGATIVE NEGATIVE mg/dL   Hgb urine dipstick NEGATIVE NEGATIVE   Bilirubin Urine NEGATIVE NEGATIVE   Ketones, ur 15 (A) NEGATIVE mg/dL   Protein, ur NEGATIVE NEGATIVE mg/dL   Urobilinogen, UA 0.2 0.0 - 1.0 mg/dL   Nitrite NEGATIVE NEGATIVE   Leukocytes, UA SMALL (A) NEGATIVE  Troponin I  Result Value Ref Range   Troponin I <0.03 <0.031 ng/mL  Urine microscopic-add on  Result Value Ref Range   Squamous Epithelial / LPF RARE RARE   WBC, UA 3-6 <3 WBC/hpf   RBC / HPF 3-6 <3 RBC/hpf   Bacteria, UA MANY (A) RARE   Ct Head Wo Contrast  07/14/2014   CLINICAL DATA:  Broad from home by ambulance. Right-sided weakness. Altered mental status.  EXAM: CT HEAD WITHOUT CONTRAST  TECHNIQUE: Contiguous axial images were obtained from the base of the skull through the vertex without intravenous contrast.  COMPARISON:  MRI 03/23/2014.  CT 03/22/2014.  FINDINGS: The brain shows generalized atrophy. There is old infarction in the right middle cerebral artery territory affecting the posterior frontal and parietal region which has  progressed to atrophy and encephalomalacia. No sign of acute infarction, mass lesion, hemorrhage, hydrocephalus or extra-axial collection. The calvarium is unremarkable. Sinuses, middle ears and mastoids are clear.  IMPRESSION: No acute finding. Old infarction in the right middle cerebral artery territory.  These results were called by telephone at the time of interpretation on 07/14/2014 at 12:13 pm to Dr. Orlie Dakin , who verbally acknowledged these results.   Electronically Signed   By: Nelson Chimes M.D.   On: 07/14/2014 12:13    MDM  Discussed with multiple family members, she is a DNR code status Final diagnoses:  None   I suspect the patient may be overmedicated with benzodiazepines and opioids. If she is not improved in 12 hours I suggest she may need further diagnostic evaluation. Spoke with Dr.  [place] Pink plan: 23 hour observation telemetry NPO,  aspiration precautions,  Diagnosis#1 acute encephalopathy  #2 hypokalemia   Orlie Dakin, MD 07/14/14 1530

## 2014-07-14 NOTE — ED Notes (Signed)
PATIENT FAMILY STATES SHE HAS A DNR. DID NOT BRING PAPER WORK WITH HIM.

## 2014-07-15 ENCOUNTER — Observation Stay (HOSPITAL_COMMUNITY): Payer: Medicare Other

## 2014-07-15 ENCOUNTER — Telehealth: Payer: Self-pay | Admitting: Family Medicine

## 2014-07-15 DIAGNOSIS — G9340 Encephalopathy, unspecified: Secondary | ICD-10-CM | POA: Diagnosis not present

## 2014-07-15 LAB — COMPREHENSIVE METABOLIC PANEL
ALT: 25 U/L (ref 0–35)
ANION GAP: 6 (ref 5–15)
AST: 36 U/L (ref 0–37)
Albumin: 3.2 g/dL — ABNORMAL LOW (ref 3.5–5.2)
Alkaline Phosphatase: 151 U/L — ABNORMAL HIGH (ref 39–117)
BUN: 11 mg/dL (ref 6–23)
CALCIUM: 8.5 mg/dL (ref 8.4–10.5)
CO2: 26 mmol/L (ref 19–32)
CREATININE: 0.54 mg/dL (ref 0.50–1.10)
Chloride: 113 mEq/L — ABNORMAL HIGH (ref 96–112)
GFR calc Af Amer: >90 mL/min (ref >90)
GFR, EST NON AFRICAN AMERICAN: 86 mL/min — AB (ref >90)
Glucose, Bld: 91 mg/dL (ref 70–99)
Potassium: 3.5 mmol/L (ref 3.5–5.1)
Sodium: 145 mmol/L (ref 135–145)
Total Bilirubin: 1 mg/dL (ref 0.3–1.2)
Total Protein: 5.8 g/dL — ABNORMAL LOW (ref 6.0–8.3)

## 2014-07-15 LAB — CBC
HEMATOCRIT: 38 % (ref 36.0–46.0)
HEMOGLOBIN: 12.2 g/dL (ref 12.0–15.0)
MCH: 32.4 pg (ref 26.0–34.0)
MCHC: 32.1 g/dL (ref 30.0–36.0)
MCV: 101.1 fL — ABNORMAL HIGH (ref 78.0–100.0)
Platelets: 274 10*3/uL (ref 150–400)
RBC: 3.76 MIL/uL — AB (ref 3.87–5.11)
RDW: 14.2 % (ref 11.5–15.5)
WBC: 7.4 10*3/uL (ref 4.0–10.5)

## 2014-07-15 MED ORDER — APIXABAN 5 MG PO TABS
5.0000 mg | ORAL_TABLET | Freq: Two times a day (BID) | ORAL | Status: DC
  Administered 2014-07-15: 5 mg via ORAL
  Filled 2014-07-15: qty 1

## 2014-07-15 MED ORDER — BUSPIRONE HCL 5 MG PO TABS
5.0000 mg | ORAL_TABLET | Freq: Two times a day (BID) | ORAL | Status: DC
  Administered 2014-07-15: 5 mg via ORAL
  Filled 2014-07-15: qty 1

## 2014-07-15 MED ORDER — HYDROCODONE-ACETAMINOPHEN 5-325 MG PO TABS
1.0000 | ORAL_TABLET | Freq: Two times a day (BID) | ORAL | Status: DC
  Administered 2014-07-15: 1 via ORAL
  Filled 2014-07-15: qty 1

## 2014-07-15 MED ORDER — TOPIRAMATE 25 MG PO TABS
25.0000 mg | ORAL_TABLET | Freq: Two times a day (BID) | ORAL | Status: DC
  Administered 2014-07-15: 25 mg via ORAL
  Filled 2014-07-15 (×5): qty 1

## 2014-07-15 MED ORDER — MUPIROCIN 2 % EX OINT: 1.0000 "application " | TOPICAL_OINTMENT | Freq: Two times a day (BID) | CUTANEOUS | Status: DC

## 2014-07-15 NOTE — Care Management Note (Signed)
    Page 1 of 1   07/15/2014     11:04:53 AM CARE MANAGEMENT NOTE 07/15/2014  Patient:  Erin [name], Erin [name]   Account Number:  [id number]  Date Initiated:  07/15/2014  Documentation initiated by:  Theophilus Kinds  Subjective/Objective Assessment:   Pt admitted from home with encephalopathy. Pt lives with her son and will return home at discharge. Pt is active with [facility] RN and PT. Pt has a w/c for home use.     Action/Plan:   Pt discharged home today with resumption of [facility] RN and PT. Romualdo Bolk of [facility] is aware and will collect the pts from the chart. [facility] services to start within 48 hours of discharge. No DME needs noted. Pt d/c today. Pts son and RN aware.   Anticipated DC Date:  07/15/2014   Anticipated DC Plan:  [place]  CM consult      [facility] Choice  Resumption Of Svcs/PTA Provider   Choice offered to / List presented to:  C-1 Patient        [place] arranged  HH-1 RN  [place].   Status of service:  Completed, signed off Medicare Important Message given?   (If response is "NO", the following Medicare IM given date fields will be blank) Date Medicare IM given:   Medicare IM given by:   Date Additional Medicare IM given:   Additional Medicare IM given by:    Discharge Disposition:  [place]  Per UR Regulation:    If discussed at Long Length of Stay Meetings, dates discussed:    Comments:  07/15/14 [place], RN BSN CM

## 2014-07-15 NOTE — Telephone Encounter (Signed)
Noted  

## 2014-07-15 NOTE — Discharge Summary (Signed)
Discharge Summary  Erin [name] WJX:[id number] DOB: Nov 23, 1929  PCP: Tula Nakayama, MD  Admit date: 07/14/2014 Discharge date: 07/15/2014  Time spent: 15 minutes  Recommendations for Outpatient Follow-up:  1. Patient will follow-up with her primary care physician as needed   Discharge Diagnoses:  Active Hospital Problems   Diagnosis Date Noted  . Acute encephalopathy 07/14/2014  . Obese 07/14/2014  . Atrial fibrillation 07/14/2014  . Sleep apnea 07/14/2014  . Senile dementia with behavioral disturbance 07/14/2014  . Left arm weakness 07/14/2014  . Bedridden 07/14/2014    Resolved Hospital Problems   Diagnosis Date Noted Date Resolved  No resolved problems to display.    Discharge Condition: Improved, being discharged home  Diet recommendation: Heart healthy  Filed Weights   07/14/14 1057 07/14/14 1601  Weight: 61.689 kg (136 lb) 62.959 kg (138 lb 12.8 oz)    History of present illness:  Patient is an 81 year old female past medical history of atrial fibrillation and previous CVA which left her with left-sided weakness and mostly bedridden. Patient was brought in by her son on 1/3 when she was noted to be extremely drowsy and could not really move her left arm and her speech was slurred. On admission, CT scan of the head was unremarkable, other vital signs are stable. Emergency room noted the patient was on benzos and opiates gave patient dose of Narcan without any effect. Hospitalists were called for further evaluation  Hospital Course:  Principal Problem:   Acute encephalopathy: By following morning, patient was much more awake and alert and appropriate. She told me that she's not been sleeping well and from her nephew, she got a sleep elixir that she took which made her feel quite groggy and sedated. She did not tell her son about this. Her previous left arm weakness had fully resolved and patient was easily able to lift it above her head, which was felt to be her  baseline. Given how this was attributable to her history and patient already on eliquis for anticoagulation, felt no need for further workup and patient was discharged Active Problems:   Obese: Patient meets criteria with BMI greater than 30   Atrial fibrillation: Rate controlled, on eliquis   Sleep apnea   Senile dementia with behavioral disturbance: Stable, resume Aricept on discharge   Left arm weakness: From previous stroke, more pronounced when patient was groggy, resolved   Bedridden   Procedures:  None  Consultations:  None  Discharge Exam: BP 134/61 mmHg  Pulse 96  Temp(Src) 98 F (36.7 C) (Oral)  Resp 18  Ht 5\' 6"  (1.676 m)  Wt 62.959 kg (138 lb 12.8 oz)  BMI 22.41 kg/m2  SpO2 100%  General: Alert and oriented 3 Cardiovascular: Irregular rhythm, rate controlled Respiratory: Clear to auscultation bilaterally  Discharge Instructions You were cared for by a hospitalist during your hospital stay. If you have any questions about your discharge medications or the care you received while you were in the hospital after you are discharged, you can call the unit and asked to speak with the hospitalist on call if the hospitalist that took care of you is not available. Once you are discharged, your primary care physician will handle any further medical issues. Please note that NO REFILLS for any discharge medications will be authorized once you are discharged, as it is imperative that you return to your primary care physician (or establish a relationship with a primary care physician if you do not have one) for your aftercare  needs so that they can reassess your need for medications and monitor your lab values.  Discharge Instructions    Diet - low sodium heart healthy    Complete by:  As directed      Increase activity slowly    Complete by:  As directed             Medication List    TAKE these medications        ALPRAZolam 0.25 MG tablet  Commonly known as:  XANAX    Take 0.125 mg by mouth daily as needed for anxiety.     busPIRone 5 MG tablet  Commonly known as:  BUSPAR  Take 5 mg by mouth 2 (two) times daily.     donepezil 5 MG tablet  Commonly known as:  ARICEPT  Take 5 mg by mouth at bedtime.     ELIQUIS 5 MG Tabs tablet  Generic drug:  apixaban  Take 5 mg by mouth 2 (two) times daily.     HYDROcodone-acetaminophen 5-325 MG per tablet  Commonly known as:  NORCO/VICODIN  One tablet twice daily for moderately severe pain     metoprolol 50 MG tablet  Commonly known as:  LOPRESSOR  Take 25 mg by mouth 3 (three) times daily.     mirtazapine 30 MG tablet  Commonly known as:  REMERON  Take 1 tablet (30 mg total) by mouth at bedtime.     mupirocin ointment 2 %  Commonly known as:  BACTROBAN  Place 1 application into the nose 2 (two) times daily.     omeprazole 20 MG capsule  Commonly known as:  PRILOSEC  Take 1 capsule (20 mg total) by mouth 2 (two) times daily before a meal. Take 1 capsule by mouth twice daily at 6:30am and 9:00pm.     topiramate 25 MG tablet  Commonly known as:  TOPAMAX  Take 1 tablet (25 mg total) by mouth 2 (two) times daily.       Allergies  Allergen Reactions  . Codeine Hives  . Morphine And Related Itching and Other (See Comments)    Makes pt hallucinate  . Actonel [Risedronate Sodium] Other (See Comments)    Abdominal pain  . Fosamax [Alendronate Sodium] Other (See Comments)    Abdominal pian  . Losartan Other (See Comments)    Hospitalized in 06/2013 with pancreatitis, losartan is associated with increased risk of pancreatitis ,  Hence discontinued  . Nitrofurantoin Other (See Comments)    Hospitalized with pancreatitis in 06/2013. Nitrofurantoin implicated as a possible cause       Follow-up Information    Follow up with [facility].   Contact information:   [address] [place] [address] [phone]        The results of significant diagnostics from this  hospitalization (including imaging, microbiology, ancillary and laboratory) are listed below for reference.    Significant Diagnostic Studies: Ct Head Wo Contrast  07/14/2014   CLINICAL DATA:  Broad from home by ambulance. Right-sided weakness. Altered mental status.  EXAM: CT HEAD WITHOUT CONTRAST  TECHNIQUE: Contiguous axial images were obtained from the base of the skull through the vertex without intravenous contrast.  COMPARISON:  MRI 03/23/2014.  CT 03/22/2014.  FINDINGS: The brain shows generalized atrophy. There is old infarction in the right middle cerebral artery territory affecting the posterior frontal and parietal region which has progressed to atrophy and encephalomalacia. No sign of acute infarction, mass lesion, hemorrhage, hydrocephalus or  extra-axial collection. The calvarium is unremarkable. Sinuses, middle ears and mastoids are clear.  IMPRESSION: No acute finding. Old infarction in the right middle cerebral artery territory.  These results were called by telephone at the time of interpretation on 07/14/2014 at 12:13 pm to Dr. Orlie Dakin , who verbally acknowledged these results.   Electronically Signed   By: Nelson Chimes M.D.   On: 07/14/2014 12:13    Microbiology: Recent Results (from the past 240 hour(s))  MRSA PCR Screening     Status: Abnormal   Collection Time: 07/14/14  4:45 PM  Result Value Ref Range Status   MRSA by PCR POSITIVE (A) NEGATIVE Final    Comment: CRITICAL RESULT CALLED TO, READ BACK BY AND VERIFIED WITH: BULLINS,M AT 6:15PM ON 07/14/14 BY FESTERMAN,C        The GeneXpert MRSA Assay (FDA approved for NASAL specimens only), is one component of a comprehensive MRSA colonization surveillance program. It is not intended to diagnose MRSA infection nor to guide or monitor treatment for MRSA infections.      Labs: Basic Metabolic Panel:  Recent Labs Lab 07/14/14 1144 07/15/14 0644  NA 145 145  K 3.3* 3.5  CL 111 113*  CO2 29 26  GLUCOSE 105*  91  BUN 17 11  CREATININE 0.73 0.54  CALCIUM 9.0 8.5   Liver Function Tests:  Recent Labs Lab 07/14/14 1144 07/15/14 0644  AST 41* 36  ALT 27 25  ALKPHOS 158* 151*  BILITOT 0.7 1.0  PROT 6.3 5.8*  ALBUMIN 3.5 3.2*   No results for input(s): LIPASE, AMYLASE in the last 168 hours. No results for input(s): AMMONIA in the last 168 hours. CBC:  Recent Labs Lab 07/14/14 1144 07/15/14 0644  WBC 8.6 7.4  NEUTROABS 5.4  --   HGB 12.4 12.2  HCT 38.3 38.0  MCV 100.0 101.1*  PLT 275 274   Cardiac Enzymes:  Recent Labs Lab 07/14/14 1144  TROPONINI <0.03   BNP: BNP (last 3 results) No results for input(s): PROBNP in the last 8760 hours. CBG: No results for input(s): GLUCAP in the last 168 hours.     Signed:  Annita Brod  [facility] 07/15/2014, 5:38 PM

## 2014-07-15 NOTE — Progress Notes (Signed)
Pt discharged per MD order. Reviewed discharge instructions with patient and family at bedside. No questions or concerns at time of discharge. Pt's IV catheter removed. IV clean dry and intact. Pt escorted out by Cristopher Peru.N.

## 2014-07-17 ENCOUNTER — Telehealth: Payer: Self-pay | Admitting: *Deleted

## 2014-07-17 NOTE — Telephone Encounter (Signed)
I called the son. The Eliquis is costing him about $320 a month. If they are unable to get any assistance with this, we may have to switch to Coumadin as the patient will not be able to afford the Eliquis. The patient will be seen in the office next week.

## 2014-07-18 ENCOUNTER — Telehealth: Payer: Self-pay | Admitting: Neurology

## 2014-07-18 ENCOUNTER — Telehealth: Payer: Self-pay | Admitting: *Deleted

## 2014-07-18 MED ORDER — HYDROCODONE-ACETAMINOPHEN 5-325 MG PO TABS: ORAL_TABLET | ORAL | Status: DC

## 2014-07-18 NOTE — Addendum Note (Signed)
Addended by: Denman George B on: 07/18/2014 04:40 PM   Modules accepted: Orders

## 2014-07-18 NOTE — Telephone Encounter (Signed)
Pt's son called requesting a refill for hydrocodone Please advise [phone]

## 2014-07-18 NOTE — Telephone Encounter (Signed)
Patient aware and med collected by grandson

## 2014-07-18 NOTE — Telephone Encounter (Signed)
Erin [name] with [facility] @ [phone], questioning if Dr. Jannifer Franklin will refill Rx HYDROcodone-acetaminophen (NORCO/VICODIN) 5-325 MG per tablet.  [name] will not refill medication, patient is completely out and in severe paine.  If unable to speak with Erin [name] , please call patient @ [phone].

## 2014-07-18 NOTE — Telephone Encounter (Signed)
pls print script for 1 tablet 3 times daily, # 15 only, this can be collected today, will need ID  it is to be collected will discuss pain needs at [place] on 07/22/2013

## 2014-07-18 NOTE — Telephone Encounter (Signed)
Son [name]) states that patient only has 1/2 pill left.  She has a hospital follow scheduled for 1/11 and is to see neurology on 1/14.  Home health nurse Arbie Cookey) states that patient is having severe back pain and is taking no more than 2 pills daily.

## 2014-07-18 NOTE — Telephone Encounter (Signed)
pls see rsponse for pain mx

## 2014-07-18 NOTE — Telephone Encounter (Signed)
I reviewed the chart, the prescription for the hydrocodone was written today by [name], 15 tablets given. I did not call [facility].

## 2014-07-22 ENCOUNTER — Telehealth: Payer: Self-pay | Admitting: Family Medicine

## 2014-07-22 ENCOUNTER — Ambulatory Visit: Payer: Medicare Other | Admitting: Family Medicine

## 2014-07-22 NOTE — Telephone Encounter (Signed)
fyi noted

## 2014-07-22 NOTE — Telephone Encounter (Signed)
noted 

## 2014-07-24 ENCOUNTER — Telehealth: Payer: Self-pay | Admitting: Neurology

## 2014-07-24 NOTE — Telephone Encounter (Signed)
I called the patient to advise the manufacturer called [place] stating they do not qualify for assistance with Eliquis.  Caregiver verbalized understanding.  Patient has an appt tomorrow.

## 2014-07-24 NOTE — Telephone Encounter (Signed)
Neoma Laming with [facility] is calling to advise that patient does not qualify for assistance for Rx Eliquis.

## 2014-07-25 ENCOUNTER — Encounter: Payer: Self-pay | Admitting: Neurology

## 2014-07-25 ENCOUNTER — Ambulatory Visit (INDEPENDENT_AMBULATORY_CARE_PROVIDER_SITE_OTHER): Payer: Medicare Other | Admitting: Neurology

## 2014-07-25 ENCOUNTER — Other Ambulatory Visit: Payer: Self-pay | Admitting: Internal Medicine

## 2014-07-25 VITALS — BP 127/76 | HR 72 | Ht 67.0 in | Wt 136.0 lb

## 2014-07-25 DIAGNOSIS — I639 Cerebral infarction, unspecified: Secondary | ICD-10-CM

## 2014-07-25 DIAGNOSIS — I69334 Monoplegia of upper limb following cerebral infarction affecting left non-dominant side: Secondary | ICD-10-CM

## 2014-07-25 DIAGNOSIS — G63 Polyneuropathy in diseases classified elsewhere: Secondary | ICD-10-CM

## 2014-07-25 DIAGNOSIS — I4891 Unspecified atrial fibrillation: Secondary | ICD-10-CM

## 2014-07-25 DIAGNOSIS — I10 Essential (primary) hypertension: Secondary | ICD-10-CM

## 2014-07-25 DIAGNOSIS — R269 Unspecified abnormalities of gait and mobility: Secondary | ICD-10-CM

## 2014-07-25 MED ORDER — ALPRAZOLAM 0.25 MG PO TABS: 0.1250 mg | ORAL_TABLET | Freq: Every day | ORAL | Status: DC | PRN

## 2014-07-25 NOTE — Patient Instructions (Signed)

## 2014-07-25 NOTE — Progress Notes (Signed)
Reason for visit: History of stroke  Erin [name] is an 81 y.o. 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 She has continued to get physical therapy, and she is doing well. She is walking short distances with a walker, and she is regaining function of her left arm. She is having some pain in the left upper arm and shoulder. She has gained quite a bit of benefit with the Remeron taking 30 mg at night for sleep, appetite, and depression. The Remeron has helped all of these issues. Occasionally, the patient may take a Xanax tablet for sleep in addition to the Remeron. The patient is on Eliquis, she finds that this is very expensive, but she does not wish to go on Coumadin. The patient is on Aricept for memory. The patient stopped taking the Topamax, and she is off Prilosec. She returns for an evaluation. She has fallen on occasion, the last fall was about one week ago. She did not injure herself.  Past Medical History  Diagnosis Date  . Anxiety   . Arthritis   . Hyperlipidemia   . Essential hypertension, benign   . Osteoporosis   . GERD (gastroesophageal reflux disease)   . Hearing loss   . Restless leg   . Tremor   . Leg edema   . Obese   . Gall bladder disease   . Cataract   . Carpal tunnel syndrome of left wrist   . Atrial fibrillation   . Sleep apnea   . Esophageal dilatation   . Frequent falls   . History of nuclear stress test 08/31/2012    Lexiscan cardiolite negative for ischemia  . Polyneuropathy in other diseases classified elsewhere 10/03/2013  . Cardioembolic stroke 43/15/4008    Past Surgical History  Procedure Laterality Date  . Appendectomy    . Cholecystectomy    . Abdominal hysterectomy    . Fracture surgery      Left arm x4  . Benign cyst removed from kidney and lung, bilateral mastectomy    . Bilateral  great toenail removal    . Knee arthroscopy  2012    Right knee, Dr Aline Brochure  . Left forearm    . Breast surgery      Bilateral 2000  . Tonsillectomy    . R hand surgery    . Bronchoscopy  02/15/2000  . Right vats.  "  . Right upper lobe wedge resection.  "  . Upper gastrointestinal endoscopy  11/25/1999    Esophagitis/ Normal proximal esophagus, stomach and duodenum  . Colonoscopy  01/17/2009    Dr. Deatra Ina : Internal hemorrhoids/Diverticula, scattered in the ascending colon/  Moderate diverticulosis ascending colon to sigmoid colon  . Esophagogastroduodenoscopy   04/23/2003    Dr. Deatra Ina: HIATAL HERNIA  . Colonoscopy  01/16/2001    Normal  . Cataract extraction, bilateral    . Esophagogastroduodenoscopy  03/20/2012    QPY:PPJKDTOI ring-LIKELY CAUSING MILD DYSPHAGIA/SMALL hiatal hernia/Multiple sessile polyps ranging between 3-59mm , path benign  . Cataract extraction    . Carpal tunnel release      Left wrist  . Transthoracic echocardiogram  06/24/2009    EF=>55%, mild assymetric LVH; LA mildly dilated; mild mitral annular calcif, borderline MVP, mild-mod MR; mild-mod TR, RV systolic pressure elevated, mild pulm HTN; AV mildly sclerotic; mild pulm valve  regurg - ordered r/t bradycardia   . Endovenous ablation saphenous vein w/ laser  01/2011    Right GSV  . Cholecystectomy    . Intramedullary (im) nail intertrochanteric Left 03/25/2014    Procedure: INTRAMEDULLARY NAIL INTERTROCHANTRIC LEFT HIP;  Surgeon: Marianna Payment, MD;  Location: [facility];  Service: Orthopedics;  Laterality: Left;    Family History  Problem Relation Age of Onset  . Diabetes Mother   . Heart disease Mother   . Stroke Mother   . Cancer Sister     KIDNEY  . Emphysema Sister   . Heart disease Brother   . Heart disease Sister   . Emphysema Sister   . Cancer Sister     BREAST  . Heart disease Brother   . Colon cancer Sister   . Alzheimer's disease Father   . Heart disease Sister   . Tremor Sister     . Tremor Brother   . Alcoholism Child   . Cancer Brother   . Pancreatic cancer Brother   . Diabetes Son     Social history:  reports that she has never smoked. She has never used smokeless tobacco. She reports that she does not drink alcohol or use illicit drugs.    Allergies  Allergen Reactions  . Codeine Hives  . Morphine And Related Itching and Other (See Comments)    Makes pt hallucinate  . Actonel [Risedronate Sodium] Other (See Comments)    Abdominal pain  . Fosamax [Alendronate Sodium] Other (See Comments)    Abdominal pian  . Losartan Other (See Comments)    Hospitalized in 06/2013 with pancreatitis, losartan is associated with increased risk of pancreatitis ,  Hence discontinued  . Nitrofurantoin Other (See Comments)    Hospitalized with pancreatitis in 06/2013. Nitrofurantoin implicated as a possible cause    Medications:  Current Outpatient Prescriptions on File Prior to Visit  Medication Sig Dispense Refill  . apixaban (ELIQUIS) 5 MG TABS tablet Take 5 mg by mouth 2 (two) times daily.    . busPIRone (BUSPAR) 5 MG tablet Take 5 mg by mouth 2 (two) times daily.    [name] donepezil (ARICEPT) 5 MG tablet Take 5 mg by mouth at bedtime.    . metoprolol (LOPRESSOR) 50 MG tablet Take 25 mg by mouth 3 (three) times daily.    . mirtazapine (REMERON) 30 MG tablet Take 1 tablet (30 mg total) by mouth at bedtime. 30 tablet 1  . HYDROcodone-acetaminophen (NORCO/VICODIN) 5-325 MG per tablet One tablet three times daily as needed for moderately severe pain (Patient not taking: Reported on 07/25/2014) 15 tablet 0  . mupirocin ointment (BACTROBAN) 2 % Place 1 application into the nose 2 (two) times daily. (Patient not taking: Reported on 07/25/2014) 22 g 0  . omeprazole (PRILOSEC) 20 MG capsule Take 1 capsule (20 mg total) by mouth 2 (two) times daily before a meal. Take 1 capsule by mouth twice daily at 6:30am and 9:00pm. (Patient not taking: Reported on 07/25/2014) 60 capsule 3   No  current facility-administered medications on file prior to visit.    ROS:  Out of a complete 14 system review of symptoms, the patient complains only of the following symptoms, and all other reviewed systems are negative.  Cold intolerance Constipation Gait instability  Blood pressure 127/76, pulse 72, height 5\' 7"  (1.702 m), weight 136 lb (61.689 kg).  Physical Exam  General: The patient is alert and cooperative at the time of the examination.  Skin: No  significant peripheral edema is noted.   Neurologic Exam  Mental status: The patient is oriented x 3.  Cranial nerves: Facial symmetry is present. Speech is normal, no aphasia or dysarthria is noted. Extraocular movements are full. Visual fields are full.  Motor: The patient has good strength in the right extremities. The patient has 4/5 strength on the left arm and leg, left foot drop.  Sensory examination: Soft touch sensation is symmetric on the face, arms, and legs.  Coordination: The patient has good finger-nose-finger and heel-to-shin bilaterally, with the exception that the patient has some dysmetria with finger-nose-finger on the left.  Gait and station: The patient has a wide-based, unsteady gait. The patient is able to ambulate with assistance. She has a tendency to lean backwards.  Reflexes: Deep tendon reflexes are symmetric.   Assessment/Plan:  1. Right brain stroke, left hemiparesis  2. Mild memory disturbance  3. Gait disturbance  4. Essential tremor  5. Peripheral neuropathy  The patient is currently off of the Topamax. She will continue the Eliquis. The patient was given a small prescription for alprazolam for sleep at night if needed. She is doing much better with her sleep, depression, and with her appetite. She will follow-up in 5 or 6 months.  Jill Alexanders MD 07/25/2014 8:34 PM  [facility] [address] [place], [place] [address]  Phone [phone] Fax  [phone]

## 2014-07-26 ENCOUNTER — Other Ambulatory Visit: Payer: Self-pay | Admitting: Internal Medicine

## 2014-07-29 ENCOUNTER — Other Ambulatory Visit: Payer: Self-pay | Admitting: Internal Medicine

## 2014-07-29 ENCOUNTER — Other Ambulatory Visit: Payer: Self-pay

## 2014-07-29 ENCOUNTER — Telehealth: Payer: Self-pay | Admitting: *Deleted

## 2014-07-29 MED ORDER — BUSPIRONE HCL 5 MG PO TABS: 5.0000 mg | ORAL_TABLET | Freq: Two times a day (BID) | ORAL | Status: DC

## 2014-07-29 MED ORDER — DONEPEZIL HCL 5 MG PO TABS: 5.0000 mg | ORAL_TABLET | Freq: Every day | ORAL | Status: DC

## 2014-07-29 MED ORDER — OMEPRAZOLE 20 MG PO CPDR: 20.0000 mg | DELAYED_RELEASE_CAPSULE | Freq: Two times a day (BID) | ORAL | Status: DC

## 2014-07-29 MED ORDER — MIRTAZAPINE 30 MG PO TABS: 30.0000 mg | ORAL_TABLET | Freq: Every day | ORAL | Status: DC

## 2014-07-29 MED ORDER — METOPROLOL TARTRATE 50 MG PO TABS: 25.0000 mg | ORAL_TABLET | Freq: Three times a day (TID) | ORAL | Status: DC

## 2014-07-29 NOTE — Telephone Encounter (Signed)
Patient son called and stated that patient needed a refill on her Eliquis. Patient was discharged from [facility] on 06/25/2014. Explained to son that he was going to have to call her Primary Dr. For a refill. Explained that once patient was discharged from facility they are no longer our patient. Offered him an appointment for patient to get established with our practice but he stated he will call her Primary Dr. Jaymes Graff her Neurologist.

## 2014-07-30 ENCOUNTER — Ambulatory Visit: Payer: Medicare Other | Admitting: Family Medicine

## 2014-07-30 ENCOUNTER — Other Ambulatory Visit: Payer: Self-pay

## 2014-07-30 ENCOUNTER — Other Ambulatory Visit: Payer: Self-pay | Admitting: Family Medicine

## 2014-07-30 ENCOUNTER — Telehealth: Payer: Self-pay | Admitting: Family Medicine

## 2014-07-30 ENCOUNTER — Telehealth: Payer: Self-pay

## 2014-07-30 ENCOUNTER — Telehealth: Payer: Self-pay | Admitting: Neurology

## 2014-07-30 MED ORDER — APIXABAN 5 MG PO TABS: 5.0000 mg | ORAL_TABLET | Freq: Two times a day (BID) | ORAL | Status: DC

## 2014-07-30 MED ORDER — ATORVASTATIN CALCIUM 40 MG PO TABS: 40.0000 mg | ORAL_TABLET | Freq: Every day | ORAL | Status: DC

## 2014-07-30 NOTE — Telephone Encounter (Signed)
[name] , Nurse Case Manager with [facility] @ [phone], requesting Rx refill for apixaban (ELIQUIS) 5 MG TABS tablet.  Please forward refill request to [facility] in [place].

## 2014-07-30 NOTE — Telephone Encounter (Signed)
I think she came and left without being seen, if so let her know I am sorry I was unable to see her today. I am thankful that she is improving overall. I have entered lipitor 20mg  tablets historically, pls send in after you speak with her

## 2014-07-30 NOTE — Telephone Encounter (Signed)
Noted  

## 2014-07-30 NOTE — Telephone Encounter (Signed)
Erin [name] is asking that a refill of lipitor be sent for her but I don't see it on her med list. Please advise

## 2014-07-30 NOTE — Telephone Encounter (Signed)
Has 3 pm appt today pls remind her tha she needs to come, will address then, if does not come send in lipitor 20mg  , not 40 mg please, and will need fasting lipid and cmp in 3 month

## 2014-07-31 ENCOUNTER — Telehealth: Payer: Self-pay

## 2014-07-31 ENCOUNTER — Other Ambulatory Visit: Payer: Self-pay

## 2014-07-31 MED ORDER — ATORVASTATIN CALCIUM 20 MG PO TABS: 20.0000 mg | ORAL_TABLET | Freq: Every day | ORAL | Status: DC

## 2014-07-31 NOTE — Telephone Encounter (Signed)
Done

## 2014-08-01 ENCOUNTER — Telehealth: Payer: Self-pay | Admitting: *Deleted

## 2014-08-01 ENCOUNTER — Other Ambulatory Visit: Payer: Self-pay | Admitting: Family Medicine

## 2014-08-01 ENCOUNTER — Telehealth: Payer: Self-pay | Admitting: Family Medicine

## 2014-08-01 NOTE — Telephone Encounter (Signed)
Script sent  

## 2014-08-01 NOTE — Telephone Encounter (Signed)
[name] called for pt stating pt needs a rx for a hemi walker, [facility] told her to call the office for that, Colletta Maryland would like a return call to [phone]. Please advise

## 2014-08-01 NOTE — Telephone Encounter (Signed)
Spoke with her

## 2014-08-01 NOTE — Telephone Encounter (Signed)
I spoke with caregiver,Stephanie, states that since her fall, on 01/10 , she is experiencing more pain, uses on avg one pain pill per day, sometimes she takes half. Responsible person on file shopul;d collec pain script which I am entering, on Monday , pls print and I will sign

## 2014-08-05 ENCOUNTER — Other Ambulatory Visit: Payer: Self-pay

## 2014-08-05 ENCOUNTER — Telehealth: Payer: Self-pay | Admitting: Family Medicine

## 2014-08-05 MED ORDER — HYDROCODONE-ACETAMINOPHEN 5-325 MG PO TABS: ORAL_TABLET | ORAL | Status: DC

## 2014-08-05 NOTE — Telephone Encounter (Signed)
script sent 1/21

## 2014-08-05 NOTE — Telephone Encounter (Signed)
Noted and printed for collection

## 2014-08-12 ENCOUNTER — Ambulatory Visit (INDEPENDENT_AMBULATORY_CARE_PROVIDER_SITE_OTHER): Payer: Medicare Other | Admitting: Family Medicine

## 2014-08-12 ENCOUNTER — Encounter: Payer: Self-pay | Admitting: Family Medicine

## 2014-08-12 VITALS — BP 140/68 | HR 62 | Resp 18

## 2014-08-12 DIAGNOSIS — F03918 Unspecified dementia, unspecified severity, with other behavioral disturbance: Secondary | ICD-10-CM

## 2014-08-12 DIAGNOSIS — I639 Cerebral infarction, unspecified: Secondary | ICD-10-CM

## 2014-08-12 DIAGNOSIS — I482 Chronic atrial fibrillation, unspecified: Secondary | ICD-10-CM

## 2014-08-12 DIAGNOSIS — Z09 Encounter for follow-up examination after completed treatment for conditions other than malignant neoplasm: Secondary | ICD-10-CM

## 2014-08-12 DIAGNOSIS — J3089 Other allergic rhinitis: Secondary | ICD-10-CM

## 2014-08-12 DIAGNOSIS — J302 Other seasonal allergic rhinitis: Secondary | ICD-10-CM | POA: Diagnosis not present

## 2014-08-12 DIAGNOSIS — M6281 Muscle weakness (generalized): Secondary | ICD-10-CM

## 2014-08-12 DIAGNOSIS — F411 Generalized anxiety disorder: Secondary | ICD-10-CM

## 2014-08-12 DIAGNOSIS — G4700 Insomnia, unspecified: Secondary | ICD-10-CM

## 2014-08-12 DIAGNOSIS — F0391 Unspecified dementia with behavioral disturbance: Secondary | ICD-10-CM

## 2014-08-12 DIAGNOSIS — I10 Essential (primary) hypertension: Secondary | ICD-10-CM

## 2014-08-12 MED ORDER — MONTELUKAST SODIUM 10 MG PO TABS: 10.0000 mg | ORAL_TABLET | Freq: Every day | ORAL | Status: DC

## 2014-08-12 NOTE — Patient Instructions (Signed)
F/u in 2.5 month, call if you need me before  Start once daily singulair to help with runny nose from allergies   I will refer you to out patient PT and we will keep doing what we are able to as you get better  Be careful not to fall!

## 2014-08-22 ENCOUNTER — Telehealth: Payer: Self-pay | Admitting: *Deleted

## 2014-08-22 NOTE — Telephone Encounter (Signed)
Pt's son called stating pt needs her hydrocodone refilled and he may send grand son to pick it up when it's ready, per son the grand son has picked it up before and he is 79 years old. Pt's son is requesting the nurse to call him when it is ready. Please advise

## 2014-08-23 ENCOUNTER — Other Ambulatory Visit: Payer: Self-pay | Admitting: Family Medicine

## 2014-08-23 NOTE — Telephone Encounter (Signed)
Erin [name] sent you an email that [name] wasn't going to be able to go to the nursing home because of her insurance. She has been having a lot of pain and is out of her pain meds which arent due until Feb 24. Please advise (directions are to take one daily as needed)

## 2014-08-23 NOTE — Telephone Encounter (Signed)
Son will come collect today

## 2014-08-23 NOTE — Telephone Encounter (Signed)
Will write a new script historically in computer for twice daily dosing, based on report from s/w that pt is in pain and unable to return to SNF due to ins issues Pls enter on a hard script and have this copied for scanning so that script may be collected today. Pls call me if you have questions

## 2014-08-26 ENCOUNTER — Other Ambulatory Visit: Payer: Self-pay | Admitting: Internal Medicine

## 2014-08-30 ENCOUNTER — Other Ambulatory Visit: Payer: Self-pay | Admitting: Internal Medicine

## 2014-09-03 ENCOUNTER — Other Ambulatory Visit: Payer: Self-pay | Admitting: Family Medicine

## 2014-09-04 ENCOUNTER — Telehealth: Payer: Self-pay

## 2014-09-06 ENCOUNTER — Telehealth: Payer: Self-pay | Admitting: Family Medicine

## 2014-09-06 NOTE — Telephone Encounter (Signed)
Patient advised to try Melatonin as needed for insomnia

## 2014-09-06 NOTE — Telephone Encounter (Signed)
See previous telephone message  

## 2014-09-11 ENCOUNTER — Ambulatory Visit: Payer: Medicare Other | Admitting: Neurology

## 2014-09-16 ENCOUNTER — Telehealth: Payer: Self-pay

## 2014-09-16 ENCOUNTER — Other Ambulatory Visit: Payer: Self-pay | Admitting: Family Medicine

## 2014-09-16 DIAGNOSIS — N3000 Acute cystitis without hematuria: Secondary | ICD-10-CM

## 2014-09-16 NOTE — Telephone Encounter (Signed)
Advised to have sitter come and collect specimen materials to send urine for culture.  Sitter will call to follow up on process.

## 2014-09-16 NOTE — Addendum Note (Signed)
Addended by: Denman George B on: 09/16/2014 02:41 PM   Modules accepted: Orders

## 2014-09-17 ENCOUNTER — Ambulatory Visit (HOSPITAL_COMMUNITY)
Admission: RE | Admit: 2014-09-17 | Discharge: 2014-09-17 | Disposition: A | Discharge status: Home / Self Care | Payer: Medicare Other | Source: Ambulatory Visit | Attending: Family Medicine | Admitting: Family Medicine

## 2014-09-17 ENCOUNTER — Telehealth: Payer: Self-pay

## 2014-09-17 DIAGNOSIS — R05 Cough: Secondary | ICD-10-CM | POA: Insufficient documentation

## 2014-09-17 DIAGNOSIS — R059 Cough, unspecified: Secondary | ICD-10-CM

## 2014-09-17 NOTE — Telephone Encounter (Signed)
States she has been coughing bad- producing yellow phlegm x 5 days and is scared of getting pneumonia and wants some medication/chest xray since there aren't any openings today. Please advise

## 2014-09-17 NOTE — Telephone Encounter (Signed)
Patient on the way here

## 2014-09-17 NOTE — Telephone Encounter (Addendum)
If no fever, pls order CXR, sputum c/s and cbc and diff, needs them all today, not staT, WILL FOLLOW UP RESULTS , MAY BE ADDED ON tHURSDAY, AS WORK IN , NEEDS ANNUAL WELLNESS SCHED FOR MID TO END MAY, SHE HASNO F/U APPT CURRENTLY IBN SYSTEM

## 2014-09-17 NOTE — Addendum Note (Signed)
Addended by: Eual Fines on: 09/17/2014 01:45 PM   Modules accepted: Orders

## 2014-09-18 ENCOUNTER — Telehealth: Payer: Self-pay | Admitting: Family Medicine

## 2014-09-18 ENCOUNTER — Other Ambulatory Visit: Payer: Self-pay | Admitting: Family Medicine

## 2014-09-18 DIAGNOSIS — R45851 Suicidal ideations: Secondary | ICD-10-CM

## 2014-09-18 DIAGNOSIS — F418 Other specified anxiety disorders: Secondary | ICD-10-CM

## 2014-09-18 DIAGNOSIS — F329 Major depressive disorder, single episode, unspecified: Secondary | ICD-10-CM

## 2014-09-18 LAB — CBC WITH DIFFERENTIAL/PLATELET
BASOS ABS: 0.1 10*3/uL (ref 0.0–0.1)
BASOS PCT: 1 % (ref 0–1)
Eosinophils Absolute: 0.2 10*3/uL (ref 0.0–0.7)
Eosinophils Relative: 3 % (ref 0–5)
HCT: 37.2 % (ref 36.0–46.0)
Hemoglobin: 12.1 g/dL (ref 12.0–15.0)
Lymphocytes Relative: 32 % (ref 12–46)
Lymphs Abs: 1.8 10*3/uL (ref 0.7–4.0)
MCH: 32.3 pg (ref 26.0–34.0)
MCHC: 32.5 g/dL (ref 30.0–36.0)
MCV: 99.2 fL (ref 78.0–100.0)
MPV: 12.2 fL (ref 8.6–12.4)
Monocytes Absolute: 0.4 10*3/uL (ref 0.1–1.0)
Monocytes Relative: 8 % (ref 3–12)
Neutro Abs: 3.1 10*3/uL (ref 1.7–7.7)
Neutrophils Relative %: 56 % (ref 43–77)
Platelets: 265 10*3/uL (ref 150–400)
RBC: 3.75 MIL/uL — AB (ref 3.87–5.11)
RDW: 14.4 % (ref 11.5–15.5)
WBC: 5.5 10*3/uL (ref 4.0–10.5)

## 2014-09-18 MED ORDER — LEVOFLOXACIN 500 MG PO TABS: 500.0000 mg | ORAL_TABLET | Freq: Every day | ORAL | Status: DC

## 2014-09-18 NOTE — Telephone Encounter (Signed)
Spoke with pt and son Doug Sou is placed on levaquinfn for presumed UTI and bronchitis based om recent lab data. Mental health addressed, both agree ti urgent psych consult I ewill refer to both, she denies current suicidal ideation

## 2014-09-19 LAB — URINE CULTURE: Colony Count: >=100000

## 2014-09-20 LAB — RESPIRATORY CULTURE OR RESPIRATORY AND SPUTUM CULTURE: ORGANISM ID, BACTERIA: NORMAL

## 2014-09-21 ENCOUNTER — Encounter (HOSPITAL_COMMUNITY): Payer: Self-pay | Admitting: *Deleted

## 2014-09-21 ENCOUNTER — Emergency Department (HOSPITAL_COMMUNITY)
Admission: EM | Admit: 2014-09-21 | Discharge: 2014-09-21 | Disposition: A | Discharge status: Home / Self Care | Payer: Medicare Other | Attending: Emergency Medicine | Admitting: Emergency Medicine

## 2014-09-21 DIAGNOSIS — Z79899 Other long term (current) drug therapy: Secondary | ICD-10-CM | POA: Insufficient documentation

## 2014-09-21 DIAGNOSIS — E669 Obesity, unspecified: Secondary | ICD-10-CM | POA: Diagnosis not present

## 2014-09-21 DIAGNOSIS — I10 Essential (primary) hypertension: Secondary | ICD-10-CM | POA: Insufficient documentation

## 2014-09-21 DIAGNOSIS — Z7901 Long term (current) use of anticoagulants: Secondary | ICD-10-CM | POA: Insufficient documentation

## 2014-09-21 DIAGNOSIS — M1990 Unspecified osteoarthritis, unspecified site: Secondary | ICD-10-CM | POA: Diagnosis not present

## 2014-09-21 DIAGNOSIS — Z8673 Personal history of transient ischemic attack (TIA), and cerebral infarction without residual deficits: Secondary | ICD-10-CM | POA: Insufficient documentation

## 2014-09-21 DIAGNOSIS — Z8669 Personal history of other diseases of the nervous system and sense organs: Secondary | ICD-10-CM | POA: Diagnosis not present

## 2014-09-21 DIAGNOSIS — N390 Urinary tract infection, site not specified: Secondary | ICD-10-CM | POA: Insufficient documentation

## 2014-09-21 DIAGNOSIS — R339 Retention of urine, unspecified: Secondary | ICD-10-CM | POA: Diagnosis present

## 2014-09-21 DIAGNOSIS — K219 Gastro-esophageal reflux disease without esophagitis: Secondary | ICD-10-CM | POA: Insufficient documentation

## 2014-09-21 DIAGNOSIS — F419 Anxiety disorder, unspecified: Secondary | ICD-10-CM | POA: Diagnosis not present

## 2014-09-21 DIAGNOSIS — E785 Hyperlipidemia, unspecified: Secondary | ICD-10-CM | POA: Diagnosis not present

## 2014-09-21 HISTORY — DX: Urinary tract infection, site not specified: N39.0

## 2014-09-21 LAB — CBC WITH DIFFERENTIAL/PLATELET
BASOS PCT: 0 % (ref 0–1)
Basophils Absolute: 0 10*3/uL (ref 0.0–0.1)
Eosinophils Absolute: 0.2 10*3/uL (ref 0.0–0.7)
Eosinophils Relative: 3 % (ref 0–5)
HCT: 36.3 % (ref 36.0–46.0)
HEMOGLOBIN: 11.8 g/dL — AB (ref 12.0–15.0)
LYMPHS PCT: 31 % (ref 12–46)
Lymphs Abs: 1.7 10*3/uL (ref 0.7–4.0)
MCH: 32.6 pg (ref 26.0–34.0)
MCHC: 32.5 g/dL (ref 30.0–36.0)
MCV: 100.3 fL — AB (ref 78.0–100.0)
MONO ABS: 0.4 10*3/uL (ref 0.1–1.0)
Monocytes Relative: 7 % (ref 3–12)
Neutro Abs: 3.3 10*3/uL (ref 1.7–7.7)
Neutrophils Relative %: 59 % (ref 43–77)
PLATELETS: 219 10*3/uL (ref 150–400)
RBC: 3.62 MIL/uL — AB (ref 3.87–5.11)
RDW: 13.8 % (ref 11.5–15.5)
WBC: 5.6 10*3/uL (ref 4.0–10.5)

## 2014-09-21 LAB — BASIC METABOLIC PANEL
Anion gap: 4 — ABNORMAL LOW (ref 5–15)
BUN: 14 mg/dL (ref 6–23)
CO2: 27 mmol/L (ref 19–32)
Calcium: 8.8 mg/dL (ref 8.4–10.5)
Chloride: 113 mmol/L — ABNORMAL HIGH (ref 96–112)
Creatinine, Ser: 0.7 mg/dL (ref 0.50–1.10)
GFR calc Af Amer: >90 mL/min (ref >90)
GFR calc non Af Amer: 79 mL/min — ABNORMAL LOW (ref >90)
Glucose, Bld: 112 mg/dL — ABNORMAL HIGH (ref 70–99)
POTASSIUM: 3.4 mmol/L — AB (ref 3.5–5.1)
Sodium: 144 mmol/L (ref 135–145)

## 2014-09-21 LAB — URINALYSIS, ROUTINE W REFLEX MICROSCOPIC
Bilirubin Urine: NEGATIVE
Glucose, UA: NEGATIVE mg/dL
Ketones, ur: NEGATIVE mg/dL
LEUKOCYTES UA: NEGATIVE
Nitrite: NEGATIVE
PROTEIN: NEGATIVE mg/dL
SPECIFIC GRAVITY, URINE: 1.01 (ref 1.005–1.030)
Urobilinogen, UA: 0.2 mg/dL (ref 0.0–1.0)
pH: 7.5 (ref 5.0–8.0)

## 2014-09-21 LAB — URINE MICROSCOPIC-ADD ON

## 2014-09-21 MED ORDER — POTASSIUM CHLORIDE CRYS ER 20 MEQ PO TBCR
40.0000 meq | EXTENDED_RELEASE_TABLET | Freq: Once | ORAL | Status: AC
  Administered 2014-09-21: 40 meq via ORAL
  Filled 2014-09-21: qty 2

## 2014-09-21 MED ORDER — CEFUROXIME AXETIL 250 MG PO TABS
250.0000 mg | ORAL_TABLET | Freq: Once | ORAL | Status: AC
  Administered 2014-09-21: 250 mg via ORAL
  Filled 2014-09-21: qty 1

## 2014-09-21 MED ORDER — CEFUROXIME AXETIL 250 MG PO TABS: 250.0000 mg | ORAL_TABLET | Freq: Two times a day (BID) | ORAL | Status: DC

## 2014-09-21 NOTE — ED Notes (Signed)
Patient with no complaints at this time. Respirations even and unlabored. Skin warm/dry. Discharge instructions reviewed with patient at this time. Patient given opportunity to voice concerns/ask questions. Patient discharged at this time and left Emergency Department with steady gait.   

## 2014-09-21 NOTE — ED Notes (Signed)
Patient c/o urinary retention x1 week withpelvic pressure and nausea. Patient reports urgency feeling with little to no output. Per patient has voided "maybe 2 teaspoons of urine today." Per patient rentention started after starting levofloxacin that was given to her by her PCP for UTI.

## 2014-09-21 NOTE — ED Notes (Signed)
Pt voided small amount an hour ago, pt believes she may have an UTI

## 2014-09-21 NOTE — ED Notes (Signed)
Bladder scan result = 55cc.  Dr. Cathleen Fears notified.

## 2014-09-21 NOTE — ED Provider Notes (Signed)
CSN: [id number]     Arrival date & time 09/21/14  1431 History   First MD Initiated Contact with Patient 09/21/14 1455     Chief Complaint  Patient presents with  . Urinary Retention   Chief complaint "I have a bladder infection"  (Consider location/radiation/quality/duration/timing/severity/associated sxs/prior Treatment) HPI Complains of urinating small amounts with sets of urgency onset 5 days ago. She denies fever admits to nausea denies vomiting patient was prescribed Levaquin by [name] 4 days ago she's taken 4 days of the five-day course. She currently does not feel as if she needs to empty her bladder. Bladder does not feel full. She states she's urinated "1 teaspoon" today. No other associated symptoms. Past Medical History  Diagnosis Date  . Anxiety   . Arthritis   . Hyperlipidemia   . Essential hypertension, benign   . Osteoporosis   . GERD (gastroesophageal reflux disease)   . Hearing loss   . Restless leg   . Tremor   . Leg edema   . Obese   . Gall bladder disease   . Cataract   . Carpal tunnel syndrome of left wrist   . Atrial fibrillation   . Sleep apnea   . Esophageal dilatation   . Frequent falls   . History of nuclear stress test 08/31/2012    Lexiscan cardiolite negative for ischemia  . Polyneuropathy in other diseases classified elsewhere 10/03/2013  . Cardioembolic stroke 35/45/6256  . UTI (lower urinary tract infection)    Past Surgical History  Procedure Laterality Date  . Appendectomy    . Cholecystectomy    . Abdominal hysterectomy    . Fracture surgery      Left arm x4  . Benign cyst removed from kidney and lung, bilateral mastectomy    . Bilateral great toenail removal    . Knee arthroscopy  2012    Right knee, Dr Aline Brochure  . Left forearm    . Breast surgery      Bilateral 2000  . Tonsillectomy    . R hand surgery    . Bronchoscopy  02/15/2000  . Right vats.  "  . Right upper lobe wedge resection.  "  . Upper gastrointestinal  endoscopy  11/25/1999    Esophagitis/ Normal proximal esophagus, stomach and duodenum  . Colonoscopy  01/17/2009    Dr. Deatra Ina : Internal hemorrhoids/Diverticula, scattered in the ascending colon/  Moderate diverticulosis ascending colon to sigmoid colon  . Esophagogastroduodenoscopy   04/23/2003    Dr. Deatra Ina: HIATAL HERNIA  . Colonoscopy  01/16/2001    Normal  . Cataract extraction, bilateral    . Esophagogastroduodenoscopy  03/20/2012    LSL:[id number] ring-LIKELY CAUSING MILD DYSPHAGIA/SMALL hiatal hernia/Multiple sessile polyps ranging between 3-29mm , path benign  . Cataract extraction    . Carpal tunnel release      Left wrist  . Transthoracic echocardiogram  06/24/2009    EF=>55%, mild assymetric LVH; LA mildly dilated; mild mitral annular calcif, borderline MVP, mild-mod MR; mild-mod TR, RV systolic pressure elevated, mild pulm HTN; AV mildly sclerotic; mild pulm valve regurg - ordered r/t bradycardia   . Endovenous ablation saphenous vein w/ laser  01/2011    Right GSV  . Cholecystectomy    . Intramedullary (im) nail intertrochanteric Left 03/25/2014    Procedure: INTRAMEDULLARY NAIL INTERTROCHANTRIC LEFT HIP;  Surgeon: Marianna Payment, MD;  Location: [place];  Service: Orthopedics;  Laterality: Left;   Family History  Problem Relation Age of Onset  .  Diabetes Mother   . Heart disease Mother   . Stroke Mother   . Cancer Sister     KIDNEY  . Emphysema Sister   . Heart disease Brother   . Heart disease Sister   . Emphysema Sister   . Cancer Sister     BREAST  . Heart disease Brother   . Colon cancer Sister   . Alzheimer's disease Father   . Heart disease Sister   . Tremor Sister   . Tremor Brother   . Alcoholism Child   . Cancer Brother   . Pancreatic cancer Brother   . Diabetes Son    History  Substance Use Topics  . Smoking status: Never Smoker   . Smokeless tobacco: Never Used  . Alcohol Use: No   OB History    No data available     Review of Systems   Gastrointestinal: Positive for nausea.  Genitourinary: Positive for difficulty urinating.       Decreased urination  Musculoskeletal: Positive for gait problem.       Chronic back pain, walks with walker  Neurological: Positive for weakness.       Chronic right-sided weakness  All other systems reviewed and are negative.     Allergies  Codeine; Morphine and related; Actonel; Fosamax; Losartan; and Nitrofurantoin  Home Medications   Prior to Admission medications   Medication Sig Start Date End Date Taking? Authorizing Provider  ALPRAZolam Duanne Moron) 0.25 MG tablet Take 0.5 tablets (0.125 mg total) by mouth daily as needed for anxiety. 07/25/14   Kathrynn Ducking, MD  apixaban (ELIQUIS) 5 MG TABS tablet Take 1 tablet (5 mg total) by mouth 2 (two) times daily. 07/30/14   Kathrynn Ducking, MD  atorvastatin (LIPITOR) 40 MG tablet  07/30/14   Historical Provider, MD  busPIRone (BUSPAR) 5 MG tablet Take 1 tablet (5 mg total) by mouth 2 (two) times daily. 07/29/14   Fayrene Helper, MD  donepezil (ARICEPT) 5 MG tablet Take 1 tablet (5 mg total) by mouth at bedtime. 07/29/14   Fayrene Helper, MD  HYDROcodone-acetaminophen (NORCO/VICODIN) 5-325 MG per tablet One tablet twice daily, for pain 08/23/14   Fayrene Helper, MD  levofloxacin (LEVAQUIN) 500 MG tablet Take 1 tablet (500 mg total) by mouth daily. 09/18/14   Fayrene Helper, MD  metoprolol (LOPRESSOR) 50 MG tablet TAKE ONE-HALF TABLET BY MOUTH THREE TIMES DAILY 09/04/14   Fayrene Helper, MD  mirtazapine (REMERON) 30 MG tablet Take 1 tablet (30 mg total) by mouth at bedtime. 07/29/14   Fayrene Helper, MD  montelukast (SINGULAIR) 10 MG tablet Take 1 tablet (10 mg total) by mouth at bedtime. 08/12/14   Fayrene Helper, MD  mupirocin ointment (BACTROBAN) 2 % Place 1 application into the nose 2 (two) times daily. 07/15/14   Annita Brod, MD  omeprazole (PRILOSEC) 20 MG capsule Take 1 capsule (20 mg total) by mouth 2 (two) times  daily before a meal. Take 1 capsule by mouth twice daily at 6:30am and 9:00pm. 07/29/14   Fayrene Helper, MD  topiramate (TOPAMAX) 25 MG tablet  06/26/14   Historical Provider, MD   BP 156/67 mmHg  Pulse 79  Temp(Src) 97.7 F (36.5 C) (Oral)  Resp 18  Ht 5\' 7"  (1.702 m)  Wt 125 lb (56.7 kg)  BMI 19.57 kg/m2  SpO2 99% Physical Exam  Constitutional: She appears well-developed and well-nourished. No distress.  HENT:  Head: Normocephalic and atraumatic.  Eyes: Conjunctivae are normal. Pupils are equal, round, and reactive to light.  Neck: Neck supple. No tracheal deviation present. No thyromegaly present.  Cardiovascular: Normal rate.   No murmur heard. Irregularly irregular  Pulmonary/Chest: Effort normal and breath sounds normal.  Abdominal: Soft. Bowel sounds are normal. She exhibits no distension. There is no tenderness.  Genitourinary: Vagina normal.  External exam only, normal  Musculoskeletal: Normal range of motion. She exhibits no edema or tenderness.  Neurological: She is alert. Coordination normal.  Skin: Skin is warm and dry. No rash noted.  Psychiatric: She has a normal mood and affect.  Nursing note and vitals reviewed.   ED Course  Procedures (including critical care time) Labs Review Labs Reviewed - No data to display  Imaging Review No results found.   EKG Interpretation None     Bladder scan prior to urinary catheterization showed 55 mL of urine present. Results for orders placed or performed during the hospital encounter of 09/21/14  Urinalysis, Routine w reflex microscopic  Result Value Ref Range   Color, Urine YELLOW YELLOW   APPearance CLOUDY (A) CLEAR   Specific Gravity, Urine 1.010 1.005 - 1.030   pH 7.5 5.0 - 8.0   Glucose, UA NEGATIVE NEGATIVE mg/dL   Hgb urine dipstick SMALL (A) NEGATIVE   Bilirubin Urine NEGATIVE NEGATIVE   Ketones, ur NEGATIVE NEGATIVE mg/dL   Protein, ur NEGATIVE NEGATIVE mg/dL   Urobilinogen, UA 0.2 0.0 - 1.0  mg/dL   Nitrite NEGATIVE NEGATIVE   Leukocytes, UA NEGATIVE NEGATIVE  CBC with Differential/Platelet  Result Value Ref Range   WBC 5.6 4.0 - 10.5 K/uL   RBC 3.62 (L) 3.87 - 5.11 MIL/uL   Hemoglobin 11.8 (L) 12.0 - 15.0 g/dL   HCT 36.3 36.0 - 46.0 %   MCV 100.3 (H) 78.0 - 100.0 fL   MCH 32.6 26.0 - 34.0 pg   MCHC 32.5 30.0 - 36.0 g/dL   RDW 13.8 11.5 - 15.5 %   Platelets 219 150 - 400 K/uL   Neutrophils Relative % 59 43 - 77 %   Neutro Abs 3.3 1.7 - 7.7 K/uL   Lymphocytes Relative 31 12 - 46 %   Lymphs Abs 1.7 0.7 - 4.0 K/uL   Monocytes Relative 7 3 - 12 %   Monocytes Absolute 0.4 0.1 - 1.0 K/uL   Eosinophils Relative 3 0 - 5 %   Eosinophils Absolute 0.2 0.0 - 0.7 K/uL   Basophils Relative 0 0 - 1 %   Basophils Absolute 0.0 0.0 - 0.1 K/uL  Basic metabolic panel  Result Value Ref Range   Sodium 144 135 - 145 mmol/L   Potassium 3.4 (L) 3.5 - 5.1 mmol/L   Chloride 113 (H) 96 - 112 mmol/L   CO2 27 19 - 32 mmol/L   Glucose, Bld 112 (H) 70 - 99 mg/dL   BUN 14 6 - 23 mg/dL   Creatinine, Ser 0.70 0.50 - 1.10 mg/dL   Calcium 8.8 8.4 - 10.5 mg/dL   GFR calc non Af Amer 79 (L) >90 mL/min   GFR calc Af Amer >90 >90 mL/min   Anion gap 4 (L) 5 - 15  Urine microscopic-add on  Result Value Ref Range   Squamous Epithelial / LPF FEW (A) RARE   WBC, UA 0-2 <3 WBC/hpf   RBC / HPF 0-2 <3 RBC/hpf   Bacteria, UA MANY (A) RARE   Dg Chest 2 View  09/17/2014   CLINICAL DATA:  Productive cough  for 5 days  EXAM: CHEST  2 VIEW  COMPARISON:  Portable chest x-ray of 10/27/201 and 06/05/2013 5  FINDINGS: Haziness at the right lung base appears to be due to overlying breast tissue and possible implant. No active infiltrate or effusion is seen. Linear scarring is stable within the right upper lung field. Mediastinal and hilar contours are unremarkable. Mild cardiomegaly is unchanged. The bones are osteopenic.  IMPRESSION: Stable chronic change. No active lung disease. Stable cardiomegaly.   Electronically  Signed   By: Ivar Drape M.D.   On: 09/17/2014 16:09     MDM Final diagnoses:  None   Suspect partially treated urinary tract infection. No signs of urinary retention Plan urine sent for culture. Discontinue Levaquin Prescription Ceftin. She'll receive potassium chloride 40 meq orally prior to discharge follow-up [name] if not better next week  Diagnoses #1 urinary tract infection  #2 hypokalemia  #3 anemia   Orlie Dakin, MD 09/22/14 0130

## 2014-09-21 NOTE — Discharge Instructions (Signed)
Urinary Tract Infection Discontinue Levaquin (levoflaxacin). Start taking the antibiotic prescribed here instead. See [name] in the office if you're not improved in 4 or 5 days. Return if your condition worsens for any reason, if he developed fever vomiting or are concerned. Your urine has been sent for culture. We will call you if antibiotic needs to be changed A urinary tract infection (UTI) can occur any place along the urinary tract. The tract includes the kidneys, ureters, bladder, and urethra. A type of germ called bacteria often causes a UTI. UTIs are often helped with antibiotic medicine.  HOME CARE   If given, take antibiotics as told by your doctor. Finish them even if you start to feel better.  Drink enough fluids to keep your pee (urine) clear or pale yellow.  Avoid tea, drinks with caffeine, and bubbly (carbonated) drinks.  Pee often. Avoid holding your pee in for a long time.  Pee before and after having sex (intercourse).  Wipe from front to back after you poop (bowel movement) if you are a woman. Use each tissue only once. GET HELP RIGHT AWAY IF:   You have back pain.  You have lower belly (abdominal) pain.  You have chills.  You feel sick to your stomach (nauseous).  You throw up (vomit).  Your burning or discomfort with peeing does not go away.  You have a fever.  Your symptoms are not better in 3 days. MAKE SURE YOU:   Understand these instructions.  Will watch your condition.  Will get help right away if you are not doing well or get worse. Document Released: 12/15/2007 Document Revised: 03/22/2012 Document Reviewed: 01/27/2012 [facility] Patient Information 2015 [place], [place]. This information is not intended to replace advice given to you by your health care provider. Make sure you discuss any questions you have with your health care provider.

## 2014-09-22 LAB — URINE CULTURE
Colony Count: NO GROWTH
Culture: NO GROWTH
Special Requests: NORMAL

## 2014-09-23 ENCOUNTER — Telehealth: Payer: Self-pay

## 2014-09-23 ENCOUNTER — Other Ambulatory Visit: Payer: Self-pay | Admitting: Family Medicine

## 2014-09-23 DIAGNOSIS — N281 Cyst of kidney, acquired: Secondary | ICD-10-CM

## 2014-09-23 DIAGNOSIS — N3090 Cystitis, unspecified without hematuria: Secondary | ICD-10-CM

## 2014-09-23 NOTE — Telephone Encounter (Signed)
Went to ER Friday for urinary problems. No growth. Advised to call [facility] for appt. Can't get in there until summer. Wants to know if you will just order an ultrasound of her kidneys. She was told she had cysts before. Please advise

## 2014-09-23 NOTE — Telephone Encounter (Signed)
I ordered the test, pls let her know

## 2014-09-24 DIAGNOSIS — I10 Essential (primary) hypertension: Secondary | ICD-10-CM | POA: Diagnosis not present

## 2014-09-24 DIAGNOSIS — I4891 Unspecified atrial fibrillation: Secondary | ICD-10-CM | POA: Diagnosis not present

## 2014-09-24 DIAGNOSIS — I69334 Monoplegia of upper limb following cerebral infarction affecting left non-dominant side: Secondary | ICD-10-CM | POA: Diagnosis not present

## 2014-09-24 NOTE — Telephone Encounter (Signed)
Son states he will make pt aware. Otho Perl will call to give appt time

## 2014-09-25 ENCOUNTER — Emergency Department (HOSPITAL_COMMUNITY): Payer: Medicare Other

## 2014-09-25 ENCOUNTER — Telehealth: Payer: Self-pay | Admitting: Family Medicine

## 2014-09-25 ENCOUNTER — Emergency Department (HOSPITAL_COMMUNITY)
Admit: 2014-09-25 | Discharge: 2014-09-25 | Disposition: A | Discharge status: Home / Self Care | Payer: Medicare Other | Attending: Family Medicine | Admitting: Family Medicine

## 2014-09-25 ENCOUNTER — Emergency Department (HOSPITAL_COMMUNITY)
Admission: EM | Admit: 2014-09-25 | Discharge: 2014-09-25 | Disposition: A | Discharge status: Home / Self Care | Payer: Medicare Other | Attending: Emergency Medicine | Admitting: Emergency Medicine

## 2014-09-25 ENCOUNTER — Encounter (HOSPITAL_COMMUNITY): Payer: Self-pay | Admitting: *Deleted

## 2014-09-25 ENCOUNTER — Telehealth: Payer: Self-pay

## 2014-09-25 DIAGNOSIS — K219 Gastro-esophageal reflux disease without esophagitis: Secondary | ICD-10-CM | POA: Diagnosis not present

## 2014-09-25 DIAGNOSIS — E669 Obesity, unspecified: Secondary | ICD-10-CM | POA: Diagnosis not present

## 2014-09-25 DIAGNOSIS — W06XXXA Fall from bed, initial encounter: Secondary | ICD-10-CM | POA: Diagnosis not present

## 2014-09-25 DIAGNOSIS — Z87440 Personal history of urinary (tract) infections: Secondary | ICD-10-CM | POA: Insufficient documentation

## 2014-09-25 DIAGNOSIS — M1990 Unspecified osteoarthritis, unspecified site: Secondary | ICD-10-CM | POA: Diagnosis not present

## 2014-09-25 DIAGNOSIS — E785 Hyperlipidemia, unspecified: Secondary | ICD-10-CM | POA: Diagnosis not present

## 2014-09-25 DIAGNOSIS — I4891 Unspecified atrial fibrillation: Secondary | ICD-10-CM | POA: Insufficient documentation

## 2014-09-25 DIAGNOSIS — F419 Anxiety disorder, unspecified: Secondary | ICD-10-CM | POA: Diagnosis not present

## 2014-09-25 DIAGNOSIS — I10 Essential (primary) hypertension: Secondary | ICD-10-CM | POA: Diagnosis not present

## 2014-09-25 DIAGNOSIS — H9190 Unspecified hearing loss, unspecified ear: Secondary | ICD-10-CM | POA: Diagnosis not present

## 2014-09-25 DIAGNOSIS — S42302A Unspecified fracture of shaft of humerus, left arm, initial encounter for closed fracture: Secondary | ICD-10-CM

## 2014-09-25 DIAGNOSIS — Y998 Other external cause status: Secondary | ICD-10-CM | POA: Diagnosis not present

## 2014-09-25 DIAGNOSIS — Z7901 Long term (current) use of anticoagulants: Secondary | ICD-10-CM | POA: Insufficient documentation

## 2014-09-25 DIAGNOSIS — Y9389 Activity, other specified: Secondary | ICD-10-CM | POA: Diagnosis not present

## 2014-09-25 DIAGNOSIS — Z79899 Other long term (current) drug therapy: Secondary | ICD-10-CM | POA: Insufficient documentation

## 2014-09-25 DIAGNOSIS — N3090 Cystitis, unspecified without hematuria: Secondary | ICD-10-CM

## 2014-09-25 DIAGNOSIS — Y9289 Other specified places as the place of occurrence of the external cause: Secondary | ICD-10-CM | POA: Diagnosis not present

## 2014-09-25 DIAGNOSIS — S42212A Unspecified displaced fracture of surgical neck of left humerus, initial encounter for closed fracture: Secondary | ICD-10-CM | POA: Insufficient documentation

## 2014-09-25 DIAGNOSIS — Z8673 Personal history of transient ischemic attack (TIA), and cerebral infarction without residual deficits: Secondary | ICD-10-CM | POA: Insufficient documentation

## 2014-09-25 DIAGNOSIS — S4992XA Unspecified injury of left shoulder and upper arm, initial encounter: Secondary | ICD-10-CM | POA: Diagnosis present

## 2014-09-25 DIAGNOSIS — W19XXXA Unspecified fall, initial encounter: Secondary | ICD-10-CM

## 2014-09-25 DIAGNOSIS — N281 Cyst of kidney, acquired: Secondary | ICD-10-CM

## 2014-09-25 LAB — CBC WITH DIFFERENTIAL/PLATELET
BASOS ABS: 0 10*3/uL (ref 0.0–0.1)
BASOS PCT: 0 % (ref 0–1)
Eosinophils Absolute: 0 10*3/uL (ref 0.0–0.7)
Eosinophils Relative: 0 % (ref 0–5)
HCT: 34.8 % — ABNORMAL LOW (ref 36.0–46.0)
Hemoglobin: 11.1 g/dL — ABNORMAL LOW (ref 12.0–15.0)
Lymphocytes Relative: 13 % (ref 12–46)
Lymphs Abs: 1.4 10*3/uL (ref 0.7–4.0)
MCH: 31.9 pg (ref 26.0–34.0)
MCHC: 31.9 g/dL (ref 30.0–36.0)
MCV: 100 fL (ref 78.0–100.0)
Monocytes Absolute: 0.5 10*3/uL (ref 0.1–1.0)
Monocytes Relative: 4 % (ref 3–12)
NEUTROS ABS: 9.1 10*3/uL — AB (ref 1.7–7.7)
Neutrophils Relative %: 83 % — ABNORMAL HIGH (ref 43–77)
PLATELETS: 209 10*3/uL (ref 150–400)
RBC: 3.48 MIL/uL — ABNORMAL LOW (ref 3.87–5.11)
RDW: 14.1 % (ref 11.5–15.5)
WBC: 11 10*3/uL — ABNORMAL HIGH (ref 4.0–10.5)

## 2014-09-25 LAB — BASIC METABOLIC PANEL
Anion gap: 6 (ref 5–15)
BUN: 14 mg/dL (ref 6–23)
CO2: 26 mmol/L (ref 19–32)
Calcium: 9.1 mg/dL (ref 8.4–10.5)
Chloride: 111 mmol/L (ref 96–112)
Creatinine, Ser: 0.54 mg/dL (ref 0.50–1.10)
GFR calc Af Amer: >90 mL/min (ref >90)
GFR calc non Af Amer: 86 mL/min — ABNORMAL LOW (ref >90)
GLUCOSE: 125 mg/dL — AB (ref 70–99)
POTASSIUM: 4 mmol/L (ref 3.5–5.1)
Sodium: 143 mmol/L (ref 135–145)

## 2014-09-25 LAB — URINALYSIS, ROUTINE W REFLEX MICROSCOPIC
Bilirubin Urine: NEGATIVE
Glucose, UA: NEGATIVE mg/dL
Hgb urine dipstick: NEGATIVE
Ketones, ur: NEGATIVE mg/dL
LEUKOCYTES UA: NEGATIVE
NITRITE: NEGATIVE
PROTEIN: NEGATIVE mg/dL
SPECIFIC GRAVITY, URINE: 1.01 (ref 1.005–1.030)
UROBILINOGEN UA: 0.2 mg/dL (ref 0.0–1.0)
pH: 7 (ref 5.0–8.0)

## 2014-09-25 MED ORDER — FENTANYL CITRATE 0.05 MG/ML IJ SOLN
50.0000 ug | Freq: Once | INTRAMUSCULAR | Status: AC
  Administered 2014-09-25: 50 ug via INTRAVENOUS
  Filled 2014-09-25: qty 2

## 2014-09-25 MED ORDER — SODIUM CHLORIDE 0.9 % IV BOLUS (SEPSIS)
500.0000 mL | Freq: Once | INTRAVENOUS | Status: AC
  Administered 2014-09-25: 500 mL via INTRAVENOUS

## 2014-09-25 MED ORDER — HYDROCODONE-ACETAMINOPHEN 5-325 MG PO TABS: 1.0000 | ORAL_TABLET | ORAL | Status: DC | PRN

## 2014-09-25 NOTE — ED Provider Notes (Signed)
CSN: [id number]     Arrival date & time 09/25/14  1341 History  This chart was scribed for Nat Christen, MD by Einar Pheasant, Medical Scribe. This patient was seen in room APA01/APA01 and the patient's care was started at 2:13 PM.    Chief Complaint  Patient presents with  . Fall   The history is provided by the patient and medical records. No language interpreter was used.   HPI Comments: Erin [name] is a 81 y.o. female with a h/o frequent falls presents to the Emergency Department complaining of a fall that occurred at 4 o'clock this morning. Pt state that she was getting out of bed when her feet go tangled up in the sheets. Now she is complaining of associated pain, swelling , and bruising to her left shoulder. She also endorses a HA over her right eye. Pt denies any head trauma or injury. No LOC. Pt denies any fever, neck pain, sore throat, visual disturbance, CP, cough, SOB, abdominal pain, nausea, emesis, diarrhea, urinary symptoms, back pain, weakness, numbness and rash as associated symptoms.     Past Medical History  Diagnosis Date  . Anxiety   . Arthritis   . Hyperlipidemia   . Essential hypertension, benign   . Osteoporosis   . GERD (gastroesophageal reflux disease)   . Hearing loss   . Restless leg   . Tremor   . Leg edema   . Obese   . Gall bladder disease   . Cataract   . Carpal tunnel syndrome of left wrist   . Atrial fibrillation   . Sleep apnea   . Esophageal dilatation   . Frequent falls   . History of nuclear stress test 08/31/2012    Lexiscan cardiolite negative for ischemia  . Polyneuropathy in other diseases classified elsewhere 10/03/2013  . Cardioembolic stroke 62/95/2841  . UTI (lower urinary tract infection)    Past Surgical History  Procedure Laterality Date  . Appendectomy    . Cholecystectomy    . Abdominal hysterectomy    . Fracture surgery      Left arm x4  . Benign cyst removed from kidney and lung, bilateral mastectomy    . Bilateral  great toenail removal    . Knee arthroscopy  2012    Right knee, Dr Aline Brochure  . Left forearm    . Breast surgery      Bilateral 2000  . Tonsillectomy    . R hand surgery    . Bronchoscopy  02/15/2000  . Right vats.  "  . Right upper lobe wedge resection.  "  . Upper gastrointestinal endoscopy  11/25/1999    Esophagitis/ Normal proximal esophagus, stomach and duodenum  . Colonoscopy  01/17/2009    Dr. Deatra Ina : Internal hemorrhoids/Diverticula, scattered in the ascending colon/  Moderate diverticulosis ascending colon to sigmoid colon  . Esophagogastroduodenoscopy   04/23/2003    Dr. Deatra Ina: HIATAL HERNIA  . Colonoscopy  01/16/2001    Normal  . Cataract extraction, bilateral    . Esophagogastroduodenoscopy  03/20/2012    LKG:[id number] ring-LIKELY CAUSING MILD DYSPHAGIA/SMALL hiatal hernia/Multiple sessile polyps ranging between 3-20mm , path benign  . Cataract extraction    . Carpal tunnel release      Left wrist  . Transthoracic echocardiogram  06/24/2009    EF=>55%, mild assymetric LVH; LA mildly dilated; mild mitral annular calcif, borderline MVP, mild-mod MR; mild-mod TR, RV systolic pressure elevated, mild pulm HTN; AV mildly sclerotic; mild pulm valve regurg -  ordered r/t bradycardia   . Endovenous ablation saphenous vein w/ laser  01/2011    Right GSV  . Cholecystectomy    . Intramedullary (im) nail intertrochanteric Left 03/25/2014    Procedure: INTRAMEDULLARY NAIL INTERTROCHANTRIC LEFT HIP;  Surgeon: Marianna Payment, MD;  Location: [place];  Service: Orthopedics;  Laterality: Left;   Family History  Problem Relation Age of Onset  . Diabetes Mother   . Heart disease Mother   . Stroke Mother   . Cancer Sister     KIDNEY  . Emphysema Sister   . Heart disease Brother   . Heart disease Sister   . Emphysema Sister   . Cancer Sister     BREAST  . Heart disease Brother   . Colon cancer Sister   . Alzheimer's disease Father   . Heart disease Sister   . Tremor Sister   .  Tremor Brother   . Alcoholism Child   . Cancer Brother   . Pancreatic cancer Brother   . Diabetes Son    History  Substance Use Topics  . Smoking status: Never Smoker   . Smokeless tobacco: Never Used  . Alcohol Use: No   OB History    No data available     Review of Systems  Musculoskeletal: Positive for arthralgias.  A complete 10 system review of systems was obtained and all systems are negative except as noted in the HPI and PMH.  Allergies  Codeine; Morphine and related; Actonel; Fosamax; Losartan; and Nitrofurantoin  Home Medications   Prior to Admission medications   Medication Sig Start Date End Date Taking? Authorizing Provider  ALPRAZolam Duanne Moron) 0.25 MG tablet Take 0.5 tablets (0.125 mg total) by mouth daily as needed for anxiety. 07/25/14  Yes Kathrynn Ducking, MD  apixaban (ELIQUIS) 5 MG TABS tablet Take 1 tablet (5 mg total) by mouth 2 (two) times daily. 07/30/14  Yes Kathrynn Ducking, MD  atorvastatin (LIPITOR) 20 MG tablet Take 20 mg by mouth daily.   Yes Historical Provider, MD  busPIRone (BUSPAR) 5 MG tablet Take 1 tablet (5 mg total) by mouth 2 (two) times daily. 07/29/14  Yes Fayrene Helper, MD  cefUROXime (CEFTIN) 250 MG tablet Take 1 tablet (250 mg total) by mouth 2 (two) times daily with a meal. 09/21/14  Yes Orlie Dakin, MD  donepezil (ARICEPT) 5 MG tablet Take 1 tablet (5 mg total) by mouth at bedtime. 07/29/14  Yes Fayrene Helper, MD  metoprolol (LOPRESSOR) 50 MG tablet TAKE ONE-HALF TABLET BY MOUTH THREE TIMES DAILY 09/04/14  Yes Fayrene Helper, MD  mirtazapine (REMERON) 30 MG tablet Take 1 tablet (30 mg total) by mouth at bedtime. 07/29/14  Yes Fayrene Helper, MD  montelukast (SINGULAIR) 10 MG tablet Take 1 tablet (10 mg total) by mouth at bedtime. 08/12/14  Yes Fayrene Helper, MD  omeprazole (PRILOSEC) 20 MG capsule Take 1 capsule (20 mg total) by mouth 2 (two) times daily before a meal. Take 1 capsule by mouth twice daily at 6:30am and  9:00pm. 07/29/14  Yes Fayrene Helper, MD  topiramate (TOPAMAX) 25 MG tablet Take 25 mg by mouth daily.  06/26/14  Yes Historical Provider, MD  HYDROcodone-acetaminophen (NORCO) 5-325 MG per tablet Take 1 tablet by mouth every 4 (four) hours as needed. 09/25/14   Nat Christen, MD  levofloxacin (LEVAQUIN) 500 MG tablet Take 1 tablet (500 mg total) by mouth daily. Patient not taking: Reported on 09/25/2014 09/18/14  Fayrene Helper, MD  mupirocin ointment (BACTROBAN) 2 % Place 1 application into the nose 2 (two) times daily. Patient not taking: Reported on 09/21/2014 07/15/14   Annita Brod, MD   BP 135/86 mmHg  Pulse 80  Temp(Src) 99.3 F (37.4 C) (Oral)  Resp 18  Ht 5\' 7"  (1.702 m)  Wt 126 lb (57.153 kg)  BMI 19.73 kg/m2  SpO2 98%  Physical Exam  Constitutional: She is oriented to person, place, and time. She appears well-developed and well-nourished.  HENT:  Head: Normocephalic and atraumatic.  Eyes: Conjunctivae and EOM are normal. Pupils are equal, round, and reactive to light.  Neck: Normal range of motion. Neck supple.  Cardiovascular: Normal rate and regular rhythm.   Pulmonary/Chest: Effort normal and breath sounds normal.  Abdominal: Soft. Bowel sounds are normal.  Musculoskeletal: Normal range of motion. She exhibits tenderness.  Proximal humerus: proximal anterior lateral aspect is tender and ecchymotic.   Neurological: She is alert and oriented to person, place, and time.  Skin: Skin is warm and dry.  Psychiatric: She has a normal mood and affect. Her behavior is normal.  Nursing note and vitals reviewed.   ED Course  Procedures (including critical care time)  DIAGNOSTIC STUDIES: Oxygen Saturation is 98% on RA, normal by my interpretation.    COORDINATION OF CARE: 2:17 PM- Will order left humerus x-ray. Pt advised of plan for treatment and pt agrees.  Labs Review Labs Reviewed  BASIC METABOLIC PANEL - Abnormal; Notable for the following:    Glucose, Bld 125  (*)    GFR calc non Af Amer 86 (*)    All other components within normal limits  CBC WITH DIFFERENTIAL/PLATELET - Abnormal; Notable for the following:    WBC 11.0 (*)    RBC 3.48 (*)    Hemoglobin 11.1 (*)    HCT 34.8 (*)    Neutrophils Relative % 83 (*)    Neutro Abs 9.1 (*)    All other components within normal limits  URINALYSIS, ROUTINE W REFLEX MICROSCOPIC - Abnormal; Notable for the following:    APPearance HAZY (*)    All other components within normal limits    Imaging Review Dg Shoulder Left  09/25/2014   CLINICAL DATA:  Fall.  Left shoulder pain.  EXAM: LEFT SHOULDER - 2+ VIEW  COMPARISON:  Left shoulder radiographs 06/07/2014  FINDINGS: An acute fracture at the left surgical neck is confirmed. The more remote fracture is again noted. The left clavicle is intact. The visualized hemi thorax is clear.  IMPRESSION: 1. Acute minimally displaced fracture through the left surgical neck. 2. The chronic fracture and buttressing are again noted.   Electronically Signed   By: San Morelle M.D.   On: 09/25/2014 16:24   Dg Humerus Left  09/25/2014   CLINICAL DATA:  Status post fall that occurred at 4 o'clock this morning. Pain.  EXAM: LEFT HUMERUS - 2+ VIEW  COMPARISON:  06/07/2014  FINDINGS: There is old posttraumatic deformity of the surgical neck of the left proximal humerus. There is subtle cortical irregularity along the lateral aspect of the left humeral head concerning for a superimposed acute fracture. Soft tissue edema in the subcutaneous fat overlying the left proximal humerus.  IMPRESSION: There is old posttraumatic deformity of the surgical neck of the left proximal humerus. There is subtle cortical irregularity along the lateral aspect of the left humeral head concerning for a superimposed acute fracture. Recommend a dedicated left shoulder series x-ray.  Electronically Signed   By: Kathreen Devoid   On: 09/25/2014 15:27     EKG Interpretation   Date/Time:  Wednesday September 25 2014 13:46:00 EDT Ventricular Rate:  99 PR Interval:    QRS Duration: 131 QT Interval:  381 QTC Calculation: 489 R Axis:   83 Text Interpretation:  Atrial fibrillation Ventricular premature complex  Nonspecific intraventricular conduction delay Borderline T abnormalities,  diffuse leads Confirmed by Tylah Mancillas  MD, Konner Saiz ([id number]) on 09/25/2014 1:54:59 PM      MDM   Final diagnoses:  Fall  Humerus fracture, left, closed, initial encounter    No head or neck injury. Plain films of left shoulder left humerus show a minimally displaced fracture left surgical neck. Findings were discussed with the patient and her family. Discharge medications Norco. Sling immobilizer, ice, follow-up with Dr. Aline Brochure  I personally performed the services described in this documentation, which was scribed in my presence. The recorded information has been reviewed and is accurate.    Nat Christen, MD 09/25/14 1723

## 2014-09-25 NOTE — Discharge Instructions (Signed)
You have fractured your humerus bone.   Sling immobilizer. Ice. Pain medication. Follow-up with Dr. Aline Brochure in 1-2 weeks

## 2014-09-25 NOTE — Telephone Encounter (Signed)
Noted  

## 2014-09-25 NOTE — ED Notes (Signed)
Pt states her feet got tangled up in the sheets this morning at 0400 as she was getting out of bed. Pt fell and now has pain,  Swelling, and bruising to left shoulder area.

## 2014-09-25 NOTE — Telephone Encounter (Signed)
Noted and patient went to ED

## 2014-09-25 NOTE — ED Notes (Signed)
Patient given a ginger ale per MD approval.

## 2014-09-27 ENCOUNTER — Other Ambulatory Visit (HOSPITAL_COMMUNITY): Payer: Self-pay

## 2014-09-29 ENCOUNTER — Emergency Department (HOSPITAL_COMMUNITY)
Admission: EM | Admit: 2014-09-29 | Discharge: 2014-09-29 | Disposition: A | Discharge status: Home / Self Care | Payer: Medicare Other | Attending: Emergency Medicine | Admitting: Emergency Medicine

## 2014-09-29 ENCOUNTER — Encounter (HOSPITAL_COMMUNITY): Payer: Self-pay | Admitting: *Deleted

## 2014-09-29 ENCOUNTER — Emergency Department (HOSPITAL_COMMUNITY): Payer: Medicare Other

## 2014-09-29 DIAGNOSIS — S42212D Unspecified displaced fracture of surgical neck of left humerus, subsequent encounter for fracture with routine healing: Secondary | ICD-10-CM | POA: Insufficient documentation

## 2014-09-29 DIAGNOSIS — Y92009 Unspecified place in unspecified non-institutional (private) residence as the place of occurrence of the external cause: Secondary | ICD-10-CM | POA: Diagnosis not present

## 2014-09-29 DIAGNOSIS — W19XXXA Unspecified fall, initial encounter: Secondary | ICD-10-CM | POA: Diagnosis not present

## 2014-09-29 DIAGNOSIS — Y939 Activity, unspecified: Secondary | ICD-10-CM | POA: Insufficient documentation

## 2014-09-29 DIAGNOSIS — M549 Dorsalgia, unspecified: Secondary | ICD-10-CM | POA: Diagnosis present

## 2014-09-29 DIAGNOSIS — S42302D Unspecified fracture of shaft of humerus, left arm, subsequent encounter for fracture with routine healing: Secondary | ICD-10-CM

## 2014-09-29 NOTE — Discharge Instructions (Signed)
Workup for the fall without any significant findings. Sling for the left arm. No new injury to that shoulder from what was known from the previous fall. CT of head was negative x-rays of the low back were negative x-rays of both hips and pelvis were negative. No evidence of any acute injury from the fall today. Follow-up with her doctor. In follow-up with orthopedics.

## 2014-09-29 NOTE — ED Notes (Signed)
D/c instructions provided to family member, waiting on another family member to bring clothes for the pt

## 2014-09-29 NOTE — ED Notes (Signed)
Pt arrived by EMS. Reported she was guided down to the floor. Pt seen here & dx w/ a fracture of the left arm. Pt was not wearing brace when EMS arrived

## 2014-09-29 NOTE — ED Provider Notes (Signed)
CSN: [id number]     Arrival date & time 09/29/14  6967 History   First MD Initiated Contact with Patient 09/29/14 0350     Chief Complaint  Patient presents with  . Fall     (Consider location/radiation/quality/duration/timing/severity/associated sxs/prior Treatment) Patient is a 81 y.o. female presenting with fall. The history is provided by the patient.  Fall Pertinent negatives include no chest pain, no abdominal pain, no headaches and no shortness of breath.   patient brought in by EMS for fall that occurred at home. Patient supposedly fell on the tall it and broke the back of the seat. Family members called EMS to bring her in. Patient with complaint of back pain and left shoulder pain. Patient recently seen for left proximal humerus fracture which was an acute injury on top of a chronic injury. Patient discharged home with a sling however sling is currently not on her. Patient denies any loss of consciousness denies a headache.  Past Medical History  Diagnosis Date  . Anxiety   . Arthritis   . Hyperlipidemia   . Essential hypertension, benign   . Osteoporosis   . GERD (gastroesophageal reflux disease)   . Hearing loss   . Restless leg   . Tremor   . Leg edema   . Obese   . Gall bladder disease   . Cataract   . Carpal tunnel syndrome of left wrist   . Atrial fibrillation   . Sleep apnea   . Esophageal dilatation   . Frequent falls   . History of nuclear stress test 08/31/2012    Lexiscan cardiolite negative for ischemia  . Polyneuropathy in other diseases classified elsewhere 10/03/2013  . Cardioembolic stroke 89/38/1017  . UTI (lower urinary tract infection)    Past Surgical History  Procedure Laterality Date  . Appendectomy    . Cholecystectomy    . Abdominal hysterectomy    . Fracture surgery      Left arm x4  . Benign cyst removed from kidney and lung, bilateral mastectomy    . Bilateral great toenail removal    . Knee arthroscopy  2012    Right knee, Dr  Aline Brochure  . Left forearm    . Breast surgery      Bilateral 2000  . Tonsillectomy    . R hand surgery    . Bronchoscopy  02/15/2000  . Right vats.  "  . Right upper lobe wedge resection.  "  . Upper gastrointestinal endoscopy  11/25/1999    Esophagitis/ Normal proximal esophagus, stomach and duodenum  . Colonoscopy  01/17/2009    Dr. Deatra Ina : Internal hemorrhoids/Diverticula, scattered in the ascending colon/  Moderate diverticulosis ascending colon to sigmoid colon  . Esophagogastroduodenoscopy   04/23/2003    Dr. Deatra Ina: HIATAL HERNIA  . Colonoscopy  01/16/2001    Normal  . Cataract extraction, bilateral    . Esophagogastroduodenoscopy  03/20/2012    PZW:CHENIDPO ring-LIKELY CAUSING MILD DYSPHAGIA/SMALL hiatal hernia/Multiple sessile polyps ranging between 3-30mm , path benign  . Cataract extraction    . Carpal tunnel release      Left wrist  . Transthoracic echocardiogram  06/24/2009    EF=>55%, mild assymetric LVH; LA mildly dilated; mild mitral annular calcif, borderline MVP, mild-mod MR; mild-mod TR, RV systolic pressure elevated, mild pulm HTN; AV mildly sclerotic; mild pulm valve regurg - ordered r/t bradycardia   . Endovenous ablation saphenous vein w/ laser  01/2011    Right GSV  . Cholecystectomy    .  Intramedullary (im) nail intertrochanteric Left 03/25/2014    Procedure: INTRAMEDULLARY NAIL INTERTROCHANTRIC LEFT HIP;  Surgeon: Marianna Payment, MD;  Location: [facility];  Service: Orthopedics;  Laterality: Left;   Family History  Problem Relation Age of Onset  . Diabetes Mother   . Heart disease Mother   . Stroke Mother   . Cancer Sister     KIDNEY  . Emphysema Sister   . Heart disease Brother   . Heart disease Sister   . Emphysema Sister   . Cancer Sister     BREAST  . Heart disease Brother   . Colon cancer Sister   . Alzheimer's disease Father   . Heart disease Sister   . Tremor Sister   . Tremor Brother   . Alcoholism Child   . Cancer Brother   .  Pancreatic cancer Brother   . Diabetes Son    History  Substance Use Topics  . Smoking status: Never Smoker   . Smokeless tobacco: Never Used  . Alcohol Use: No   OB History    No data available     Review of Systems  Constitutional: Negative for fever.  HENT: Negative for congestion.   Eyes: Negative for visual disturbance.  Respiratory: Negative for shortness of breath.   Cardiovascular: Negative for chest pain.  Gastrointestinal: Negative for abdominal pain.  Genitourinary: Negative for dysuria.  Musculoskeletal: Positive for back pain. Negative for neck pain.  Skin: Negative for rash and wound.  Neurological: Negative for headaches.      Allergies  Codeine; Morphine and related; Actonel; Fosamax; Losartan; and Nitrofurantoin  Home Medications   Prior to Admission medications   Medication Sig Start Date End Date Taking? Authorizing Provider  ALPRAZolam Duanne Moron) 0.25 MG tablet Take 0.5 tablets (0.125 mg total) by mouth daily as needed for anxiety. 07/25/14  Yes Kathrynn Ducking, MD  apixaban (ELIQUIS) 5 MG TABS tablet Take 1 tablet (5 mg total) by mouth 2 (two) times daily. 07/30/14  Yes Kathrynn Ducking, MD  atorvastatin (LIPITOR) 20 MG tablet Take 20 mg by mouth daily.   Yes Historical Provider, MD  busPIRone (BUSPAR) 5 MG tablet Take 1 tablet (5 mg total) by mouth 2 (two) times daily. 07/29/14  Yes Fayrene Helper, MD  cefUROXime (CEFTIN) 250 MG tablet Take 1 tablet (250 mg total) by mouth 2 (two) times daily with a meal. 09/21/14  Yes Orlie Dakin, MD  donepezil (ARICEPT) 5 MG tablet Take 1 tablet (5 mg total) by mouth at bedtime. 07/29/14  Yes Fayrene Helper, MD  HYDROcodone-acetaminophen (NORCO) 5-325 MG per tablet Take 1 tablet by mouth every 4 (four) hours as needed. 09/25/14  Yes Nat Christen, MD  levofloxacin (LEVAQUIN) 500 MG tablet Take 1 tablet (500 mg total) by mouth daily. 09/18/14  Yes Fayrene Helper, MD  metoprolol (LOPRESSOR) 50 MG tablet TAKE ONE-HALF  TABLET BY MOUTH THREE TIMES DAILY 09/04/14  Yes Fayrene Helper, MD  mirtazapine (REMERON) 30 MG tablet Take 1 tablet (30 mg total) by mouth at bedtime. 07/29/14  Yes Fayrene Helper, MD  montelukast (SINGULAIR) 10 MG tablet Take 1 tablet (10 mg total) by mouth at bedtime. 08/12/14  Yes Fayrene Helper, MD  mupirocin ointment (BACTROBAN) 2 % Place 1 application into the nose 2 (two) times daily. 07/15/14  Yes Annita Brod, MD  omeprazole (PRILOSEC) 20 MG capsule Take 1 capsule (20 mg total) by mouth 2 (two) times daily before a meal. Take 1  capsule by mouth twice daily at 6:30am and 9:00pm. 07/29/14  Yes Fayrene Helper, MD  topiramate (TOPAMAX) 25 MG tablet Take 25 mg by mouth daily.  06/26/14  Yes Historical Provider, MD   BP 108/87 mmHg  Pulse 81  Temp(Src) 98.5 F (36.9 C) (Oral)  Resp 18  SpO2 99% Physical Exam  Constitutional: She appears well-developed and well-nourished. No distress.  HENT:  Head: Normocephalic and atraumatic.  Mouth/Throat: Oropharynx is clear and moist.  Eyes: Conjunctivae are normal. Pupils are equal, round, and reactive to light.  Neck: Normal range of motion. Neck supple.  Cardiovascular: Normal rate, regular rhythm and normal heart sounds.   Pulmonary/Chest: Effort normal and breath sounds normal. No respiratory distress.  Abdominal: Soft. Bowel sounds are normal. There is no tenderness.  Musculoskeletal: She exhibits tenderness.  Tenderness to palpation to the left shoulder old bruising to that area. Radial pulse distally is 1+. Questionable tenderness to the lumbar area. No tenderness to palpation of the hips. Moving all extremities other than the left arm fine.  Neurological: She is alert. No cranial nerve deficit. She exhibits normal muscle tone. Coordination normal.  Skin: Skin is warm.  Nursing note and vitals reviewed.   ED Course  Procedures (including critical care time) Labs Review Labs Reviewed - No data to display  Imaging  Review Dg Lumbar Spine Complete  09/29/2014   CLINICAL DATA:  Felt a hard surface floor out of bed  EXAM: LUMBAR SPINE - COMPLETE 4+ VIEW  COMPARISON:  None.  FINDINGS: There is no evidence of lumbar spine fracture. Alignment is normal. Intervertebral disc spaces are maintained.  IMPRESSION: Negative.   Electronically Signed   By: Andreas Newport M.D.   On: 09/29/2014 06:40   Ct Head Wo Contrast  09/29/2014   CLINICAL DATA:  [place] from bed on a hard surface. Poor historian. Headache and posterior head pain.  EXAM: CT HEAD WITHOUT CONTRAST  TECHNIQUE: Contiguous axial images were obtained from the base of the skull through the vertex without intravenous contrast.  COMPARISON:  CT of the head July 14, 2014  FINDINGS: The ventricles and sulci are normal for age. No intraparenchymal hemorrhage, mass effect nor midline shift. Patchy supratentorial white matter hypodensities are within normal range for patient's age and though non-specific suggest sequelae of chronic small vessel ischemic disease. No acute large vascular territory infarcts. RIGHT frontal operculum/basal ganglia encephalomalacia, unchanged. Mild ex vacuo dilatation of RIGHT lateral ventricle.  No abnormal extra-axial fluid collections. Basal cisterns are patent. Mild calcific atherosclerosis of the carotid siphons.  No skull fracture. The included ocular globes and orbital contents are non-suspicious. Status post bilateral ocular lens implants. The mastoid aircells and included paranasal sinuses are well-aerated. Patient is edentulous. 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  IMPRESSION: No acute intracranial process.  Remote RIGHT middle cerebral artery territory infarct.   Electronically Signed   By: Elon Alas   On: 09/29/2014 06:46   Dg Shoulder Left  09/29/2014   CLINICAL DATA:  [place] out of bed onto hard surface, LEFT shoulder pain. Poor historian.  EXAM: LEFT SHOULDER - 2+ VIEW  COMPARISON:  LEFT shoulder radiograph  September 25, 2014  FINDINGS: Acute on chronic impacted mildly displaced LEFT humeral head neck junction fractures, unchanged from prior examination. No dislocation. Osteopenia. Soft tissue planes are nonsuspicious.  IMPRESSION: Stable acute on chronic LEFT humeral surgical neck fracture without dislocation. No new fracture deformity.   Electronically Signed   By: Elon Alas   On:  09/29/2014 06:41   Dg Hips Bilat With Pelvis 2v  09/29/2014   CLINICAL DATA:  [place] out of bed onto heart surface floor  EXAM: BILATERAL HIP (WITH PELVIS) 2 VIEWS  COMPARISON:  None.  FINDINGS: There is no evidence of fracture or dislocation about the pelvis or hips. There is an intra medullary left femoral nail fixation with interlocking femoral neck screw.  IMPRESSION: Negative for acute fracture or dislocation   Electronically Signed   By: Andreas Newport M.D.   On: 09/29/2014 06:42     EKG Interpretation None      MDM   Final diagnoses:  Fall  Humerus fracture, left, with routine healing, subsequent encounter    Patient status post fall at home. Workup here without evidence of any acute injury. Apparently no loss of consciousness. Patient fell on the tall it and broke the back of the tall at seek. Head CT negative x-rays of both hips and pelvis were negative x-ray of the lumbar area where she was complaining of pain was negative for any fractures. Also patient note to have a recent left proximal humerus fracture that was an acute one on top of a chronic injury there. No change in that based on today's x-ray. Patient will be provided another sling to the left arm because she came in without 1. She has follow-up with her record Dr. in orthopedics already arranged. Patient stable for discharge home.    Fredia Sorrow, MD 09/29/14 [phone]

## 2014-09-30 ENCOUNTER — Telehealth: Payer: Self-pay | Admitting: *Deleted

## 2014-10-01 ENCOUNTER — Other Ambulatory Visit: Payer: Self-pay

## 2014-10-01 ENCOUNTER — Other Ambulatory Visit: Payer: Self-pay | Admitting: *Deleted

## 2014-10-01 ENCOUNTER — Ambulatory Visit: Payer: Self-pay | Admitting: Orthopedic Surgery

## 2014-10-01 ENCOUNTER — Encounter: Payer: Self-pay | Admitting: *Deleted

## 2014-10-01 MED ORDER — HYDROCODONE-ACETAMINOPHEN 5-325 MG PO TABS: 0.5000 | ORAL_TABLET | ORAL | Status: DC | PRN

## 2014-10-01 NOTE — Patient Outreach (Signed)
Call received from Mrs. Lasure's son Raylene Carmickle who is HCPOA. [name] stated that Mrs. Houseworth agreed to temporarily move to his home while she continues to recover from her recent CVA/Fall (fractures). [name] is having a ramp built outside the home and he and his wife are preparing a room for Mrs. Delarocha. [name] hopes to have Mrs. Crossett moved into the home by the weekend.   I encouraged [name] to contact the various home care providers of planned move: [facility], [facility], and private duty sitters.   I have a home visit scheduled with Mrs. Geisinger next week.

## 2014-10-02 ENCOUNTER — Ambulatory Visit (INDEPENDENT_AMBULATORY_CARE_PROVIDER_SITE_OTHER): Payer: Medicare Other | Admitting: Orthopedic Surgery

## 2014-10-02 ENCOUNTER — Encounter: Payer: Self-pay | Admitting: Orthopedic Surgery

## 2014-10-02 ENCOUNTER — Other Ambulatory Visit: Payer: Self-pay | Admitting: *Deleted

## 2014-10-02 VITALS — BP 131/74 | Ht 67.0 in | Wt 126.0 lb

## 2014-10-02 DIAGNOSIS — S42202A Unspecified fracture of upper end of left humerus, initial encounter for closed fracture: Secondary | ICD-10-CM

## 2014-10-02 DIAGNOSIS — I639 Cerebral infarction, unspecified: Secondary | ICD-10-CM

## 2014-10-02 MED ORDER — HYDROCODONE-ACETAMINOPHEN 10-325 MG PO TABS: 1.0000 | ORAL_TABLET | ORAL | Status: DC | PRN

## 2014-10-02 MED ORDER — POLYETHYLENE GLYCOL 3350 17 G PO PACK
17.0000 g | PACK | Freq: Every day | ORAL | Status: DC
Start: 2014-10-02 — End: 2014-11-20

## 2014-10-02 NOTE — Patient Outreach (Signed)
Call received from son/part-time caregiver [facility] Mr. Haggar stated that Mrs. Bowden is upset today about completion of scheduled and previously discussed services through [facility]. [name] inquired about Medicare covered custodial services. I explained again that Medicare does not pay for custodial services as discussed on numerous previous occasions. I reminded [name] that Mrs. Fenster was placed on the [facility] list in the fall of 2015 and would at some point be eligible for some level of PCS services. However, this list is currently > 1 year long. I advised [name] to discuss further with his family and Mrs. Gorsline how they might work together to continue providing family driven care for Mrs. Budney.   Update provided to [employer] LCSW [facility]).   I have scheduled appointment with Mrs. Mckeever at home tomorrow.

## 2014-10-02 NOTE — Progress Notes (Signed)
Patient ID: Erin [name], female   DOB: 1929-11-01, 81 y.o.   MRN: [id number]  Chief Complaint  Patient presents with  . Follow-up    er follow up left humerus fracture, DOI 09/24/13     Erin [name] is a 81 y.o. female.   HPI This 81 year old female who is well-known to our practice presents with a new problem of the left humerus fracture which was sustained on March 16 when she fell out of bed. Her pain is located over the left shoulder. Quality dull throbbing. Severity 8-10 out of 10 unrelieved by 5 mg of hydrocodone unrelieved by 7.5 mg hydrocodone. Duration 7 days. Context fell out of the bed and landed on the left shoulder. X-rays already been taken and show a acute on chronic proximal humerus fracture with less than 45 angulation and less than a centimeter displacement of any fragment and less than 50% shaft width displacement Review of Systems Constipation, no fever no chills skin changes related to the fracture with the bruising in the upper arm  Past Medical History  Diagnosis Date  . Anxiety   . Arthritis   . Hyperlipidemia   . Essential hypertension, benign   . Osteoporosis   . GERD (gastroesophageal reflux disease)   . Hearing loss   . Restless leg   . Tremor   . Leg edema   . Obese   . Gall bladder disease   . Cataract   . Carpal tunnel syndrome of left wrist   . Atrial fibrillation   . Sleep apnea   . Esophageal dilatation   . Frequent falls   . History of nuclear stress test 08/31/2012    Lexiscan cardiolite negative for ischemia  . Polyneuropathy in other diseases classified elsewhere 10/03/2013  . Cardioembolic stroke 30/16/0109  . UTI (lower urinary tract infection)     Past Surgical History  Procedure Laterality Date  . Appendectomy    . Cholecystectomy    . Abdominal hysterectomy    . Fracture surgery      Left arm x4  . Benign cyst removed from kidney and lung, bilateral mastectomy    . Bilateral great toenail removal    . Knee arthroscopy   2012    Right knee, Dr Aline Brochure  . Left forearm    . Breast surgery      Bilateral 2000  . Tonsillectomy    . R hand surgery    . Bronchoscopy  02/15/2000  . Right vats.  "  . Right upper lobe wedge resection.  "  . Upper gastrointestinal endoscopy  11/25/1999    Esophagitis/ Normal proximal esophagus, stomach and duodenum  . Colonoscopy  01/17/2009    Dr. Deatra Ina : Internal hemorrhoids/Diverticula, scattered in the ascending colon/  Moderate diverticulosis ascending colon to sigmoid colon  . Esophagogastroduodenoscopy   04/23/2003    Dr. Deatra Ina: HIATAL HERNIA  . Colonoscopy  01/16/2001    Normal  . Cataract extraction, bilateral    . Esophagogastroduodenoscopy  03/20/2012    NAT:FTDDUKGU ring-LIKELY CAUSING MILD DYSPHAGIA/SMALL hiatal hernia/Multiple sessile polyps ranging between 3-19mm , path benign  . Cataract extraction    . Carpal tunnel release      Left wrist  . Transthoracic echocardiogram  06/24/2009    EF=>55%, mild assymetric LVH; LA mildly dilated; mild mitral annular calcif, borderline MVP, mild-mod MR; mild-mod TR, RV systolic pressure elevated, mild pulm HTN; AV mildly sclerotic; mild pulm valve regurg - ordered r/t bradycardia   . Endovenous ablation  saphenous vein w/ laser  01/2011    Right GSV  . Cholecystectomy    . Intramedullary (im) nail intertrochanteric Left 03/25/2014    Procedure: INTRAMEDULLARY NAIL INTERTROCHANTRIC LEFT HIP;  Surgeon: Marianna Payment, MD;  Location: [facility];  Service: Orthopedics;  Laterality: Left;    Family History  Problem Relation Age of Onset  . Diabetes Mother   . Heart disease Mother   . Stroke Mother   . Cancer Sister     KIDNEY  . Emphysema Sister   . Heart disease Brother   . Heart disease Sister   . Emphysema Sister   . Cancer Sister     BREAST  . Heart disease Brother   . Colon cancer Sister   . Alzheimer's disease Father   . Heart disease Sister   . Tremor Sister   . Tremor Brother   . Alcoholism Child   .  Cancer Brother   . Pancreatic cancer Brother   . Diabetes Son     Social History History  Substance Use Topics  . Smoking status: Never Smoker   . Smokeless tobacco: Never Used  . Alcohol Use: No    Allergies  Allergen Reactions  . Codeine Hives  . Morphine And Related Itching and Other (See Comments)    Makes pt hallucinate  . Actonel [Risedronate Sodium] Other (See Comments)    Abdominal pain  . Fosamax [Alendronate Sodium] Other (See Comments)    Abdominal pian  . Losartan Other (See Comments)    Hospitalized in 06/2013 with pancreatitis, losartan is associated with increased risk of pancreatitis ,  Hence discontinued  . Nitrofurantoin Other (See Comments)    Hospitalized with pancreatitis in 06/2013. Nitrofurantoin implicated as a possible cause    Current Outpatient Prescriptions  Medication Sig Dispense Refill  . ALPRAZolam (XANAX) 0.25 MG tablet Take 0.5 tablets (0.125 mg total) by mouth daily as needed for anxiety. 30 tablet 3  . apixaban (ELIQUIS) 5 MG TABS tablet Take 1 tablet (5 mg total) by mouth 2 (two) times daily. 60 tablet 6  . atorvastatin (LIPITOR) 20 MG tablet Take 20 mg by mouth daily.    . busPIRone (BUSPAR) 5 MG tablet Take 1 tablet (5 mg total) by mouth 2 (two) times daily. 60 tablet 3  . cefUROXime (CEFTIN) 250 MG tablet Take 1 tablet (250 mg total) by mouth 2 (two) times daily with a meal. 14 tablet 0  . donepezil (ARICEPT) 5 MG tablet Take 1 tablet (5 mg total) by mouth at bedtime. 30 tablet 3  . HYDROcodone-acetaminophen (NORCO/VICODIN) 5-325 MG per tablet Take 0.5-1 tablets by mouth every 4 (four) hours as needed for moderate pain. 60 tablet 0  . levofloxacin (LEVAQUIN) 500 MG tablet Take 1 tablet (500 mg total) by mouth daily. 5 tablet 0  . metoprolol (LOPRESSOR) 50 MG tablet TAKE ONE-HALF TABLET BY MOUTH THREE TIMES DAILY 45 tablet 4  . mirtazapine (REMERON) 30 MG tablet Take 1 tablet (30 mg total) by mouth at bedtime. 30 tablet 3  . montelukast  (SINGULAIR) 10 MG tablet Take 1 tablet (10 mg total) by mouth at bedtime. 30 tablet 3  . mupirocin ointment (BACTROBAN) 2 % Place 1 application into the nose 2 (two) times daily. 22 g 0  . omeprazole (PRILOSEC) 20 MG capsule Take 1 capsule (20 mg total) by mouth 2 (two) times daily before a meal. Take 1 capsule by mouth twice daily at 6:30am and 9:00pm. 60 capsule 3  . topiramate (  TOPAMAX) 25 MG tablet Take 25 mg by mouth daily.     [name] HYDROcodone-acetaminophen (NORCO) 10-325 MG per tablet Take 1 tablet by mouth every 4 (four) hours as needed. 84 tablet 0  . polyethylene glycol (MIRALAX / GLYCOLAX) packet Take 17 g by mouth daily. 14 each 1   No current facility-administered medications for this visit.       Physical Exam Blood pressure 131/74, height 5\' 7"  (1.702 m), weight 126 lb (57.153 kg). Physical Exam The patient is well developed well nourished and well groomed. Orientation to person place and time is normal  Mood is pleasant. Ambulatory status she had a hip fracture she was doing well with a walker receiving therapy but since the fractured humerus she's had to have max assist to get up to go to the bathroom Inspection of the left upper extremity reveals a lot of bruising and ecchymosis in the upper arm down to the elbow no skin lesions or breaks in the skin. His shoulder motion exam was deferred for obvious reasons as was the stability and strength examination regarding the rotator cuff however, the hand function is normal wrist joint is stable elbow was nontender muscle tone is normal  Pulse and temperature of the extremity normal lymph nodes normal sensation normal  Data Reviewed X-rays of the shoulder were reviewed I interpreted this is a near criteria nonoperative proximal humerus fracture closed  Assessment No diagnosis found. Closed Proximal humerus fracture left shoulder initial encounter Plan sling and swathe x 2 more weeks then xrays of the shoulder

## 2014-10-03 ENCOUNTER — Ambulatory Visit: Payer: Medicare Other | Admitting: Orthopedic Surgery

## 2014-10-03 ENCOUNTER — Encounter: Payer: Self-pay | Admitting: *Deleted

## 2014-10-03 ENCOUNTER — Other Ambulatory Visit: Payer: Self-pay | Admitting: *Deleted

## 2014-10-03 NOTE — Progress Notes (Signed)
This encounter was created in error - please disregard.

## 2014-10-03 NOTE — Patient Outreach (Signed)
10/03/2014  [place] [birth year] [id number]  Erin [name] is an 81 y.o. female  Subjective: "last night he called and said 'she wants to get on the potty and I'm not going to do it...someobody else has to come do it..I don't know what she's going to do tomorrow because I'm firing [place] and she can take care of herself.'"  Son Xiadani Damman, [place]  I advised [name] to contact [facility] APS and proceed with plans to remove his mother from the home as we discussed earlier in the week (see yesterday's note).  I called [facility] and spoke with case worker who saw Mrs. Crunkleton at home 2 weeks ago and provided update about Dale's concerns. She also advised that [name] remove his mother from the home if she is willing to go. Patient was evaluated for competency/posession of capacity to make her own decisions and she can consent or decline offers to be removed from the home.   I relayed the APS Caseworkers' recommendation to [name] who said he'd spoken with the caseworker himself since we last spoke.

## 2014-10-03 NOTE — Patient Outreach (Deleted)
10/03/2014  [place] [birth year] [id number]  Erin [name] is an 81 y.o. female  Subjective: "last night he called and said 'she wants to get on the potty and I'm not going to do it...someobody else has to come do it..I don't know what she's going to do tomorrow because I'm firing Sharyn Lull and she can take care of herself."  Son Alessandra Sawdey, HCPOA  Objective: ROS - face to face visit cancelled secondary to safety concerns  Physical Exam - face to face visit cancelled secondary to safety concerns Current Medications: Current Outpatient Prescriptions  Medication Sig Dispense Refill  . ALPRAZolam (XANAX) 0.25 MG tablet Take 0.5 tablets (0.125 mg total) by mouth daily as needed for anxiety. 30 tablet 3  . apixaban (ELIQUIS) 5 MG TABS tablet Take 1 tablet (5 mg total) by mouth 2 (two) times daily. 60 tablet 6  . atorvastatin (LIPITOR) 20 MG tablet Take 20 mg by mouth daily.    . busPIRone (BUSPAR) 5 MG tablet Take 1 tablet (5 mg total) by mouth 2 (two) times daily. 60 tablet 3  . cefUROXime (CEFTIN) 250 MG tablet Take 1 tablet (250 mg total) by mouth 2 (two) times daily with a meal. 14 tablet 0  . donepezil (ARICEPT) 5 MG tablet Take 1 tablet (5 mg total) by mouth at bedtime. 30 tablet 3  . HYDROcodone-acetaminophen (NORCO) 10-325 MG per tablet Take 1 tablet by mouth every 4 (four) hours as needed. 84 tablet 0  . HYDROcodone-acetaminophen (NORCO/VICODIN) 5-325 MG per tablet Take 0.5-1 tablets by mouth every 4 (four) hours as needed for moderate pain. 60 tablet 0  . levofloxacin (LEVAQUIN) 500 MG tablet Take 1 tablet (500 mg total) by mouth daily. 5 tablet 0  . metoprolol (LOPRESSOR) 50 MG tablet TAKE ONE-HALF TABLET BY MOUTH THREE TIMES DAILY 45 tablet 4  . mirtazapine (REMERON) 30 MG tablet Take 1 tablet (30 mg total) by mouth at bedtime. 30 tablet 3  . montelukast (SINGULAIR) 10 MG tablet Take 1 tablet (10 mg total) by mouth at bedtime. 30 tablet 3  . mupirocin ointment  (BACTROBAN) 2 % Place 1 application into the nose 2 (two) times daily. 22 g 0  . omeprazole (PRILOSEC) 20 MG capsule Take 1 capsule (20 mg total) by mouth 2 (two) times daily before a meal. Take 1 capsule by mouth twice daily at 6:30am and 9:00pm. 60 capsule 3  . polyethylene glycol (MIRALAX / GLYCOLAX) packet Take 17 g by mouth daily. 14 each 1  . topiramate (TOPAMAX) 25 MG tablet Take 25 mg by mouth daily.      No current facility-administered medications for this visit.   This encounter was created in error - please disregard.Functional Status: In your present state of health, do you have any difficulty performing the following activities: 10/03/2014 07/15/2014  Is the patient deaf or have difficulty hearing? N -  Hearing N -  Vision Y -  Difficulty concentrating or making decisions Y -  Walking or climbing stairs? Y -  Doing errands, shopping? Y N  Preparing Food and eating ? Y -  Using the Toilet? Y -  In the past six months, have you accidently leaked urine? Y -  Do you have problems with loss of bowel control? N -  Managing your Medications? Y -  Managing your Finances? N -  Housekeeping or managing your Housekeeping? Y -    Fall/Depression Screening: [facility] 2/9 Scores 08/12/2014 07/17/2013 02/13/2013  PHQ - 2 Score  0 2 0  PHQ- 9 Score - 3 -    Assessment:  Plan:

## 2014-10-04 ENCOUNTER — Ambulatory Visit: Payer: Medicare Other | Admitting: Neurology

## 2014-10-07 ENCOUNTER — Other Ambulatory Visit: Payer: Self-pay | Admitting: *Deleted

## 2014-10-07 NOTE — Patient Outreach (Signed)
Mrs. Buhrman son [name] called this morning to let me know that Mrs. Lott is being moved into his home today. Son [name] contacted all in home care agencies to notify them of the change of residence/address. Plan at this time is for Mrs. Ripley to reside with [name] and his wife indefinitely although Mrs. Ayub indicated to [name] that she wanted to be able to go back to her home when she gets stronger and can be more independent with self care. [facility] will continue to follow Mrs. Tango at home.    [facility]  [address] [place], [place] [address] [email or web address]   [phone] (mobile)  [phone] (main office) [phone] (fax)

## 2014-10-10 ENCOUNTER — Other Ambulatory Visit: Payer: Self-pay | Admitting: *Deleted

## 2014-10-10 ENCOUNTER — Telehealth: Payer: Self-pay | Admitting: *Deleted

## 2014-10-10 MED ORDER — ALPRAZOLAM 0.25 MG PO TABS: 0.1250 mg | ORAL_TABLET | Freq: Two times a day (BID) | ORAL | Status: DC | PRN

## 2014-10-10 NOTE — Patient Outreach (Signed)
I received a call from [name], Mrs. Lesch's son/primary caregiver/HCPOA. [name] called to report that Mrs. Demartini had a successful move to his home over the weekend and all home care services have been transferred to his address. He also reported that Mrs. Lipuma's son [name] offered to continue to provide financial support for Mrs. Bibb's private duty services. Mrs. Matteucci continues to receive HHPT/HHOT services in the home through [facility].

## 2014-10-10 NOTE — Telephone Encounter (Signed)
I spoke to Muir. He states that his mom (Erin [name]) may have taken more xanax than she should have because she has been very stressed lately and unable to sleep. He needs more xanax for her until [facility] pharmacy will refill Erin next supply.

## 2014-10-10 NOTE — Telephone Encounter (Signed)
I called and talked to the patient at telephone number [phone]. The patient has recently had a fall, she has hurt her shoulder and she is in increased pain, not able to sleep well. She has been taking a few more the alprazolam. She only gets 30 of the 0.25 mg alprazolam a month. I will increase the dosing slightly,: Another prescription.

## 2014-10-11 NOTE — Telephone Encounter (Signed)
Erin [name] called -  Xanax prescription was not at [facility].    Was not aware Xanax script needed to be picked up.     I called in #7 and he will stop by next week to pick up hard copy

## 2014-10-17 ENCOUNTER — Ambulatory Visit (INDEPENDENT_AMBULATORY_CARE_PROVIDER_SITE_OTHER): Payer: Medicare Other

## 2014-10-17 ENCOUNTER — Ambulatory Visit (INDEPENDENT_AMBULATORY_CARE_PROVIDER_SITE_OTHER): Payer: Self-pay | Admitting: Orthopedic Surgery

## 2014-10-17 VITALS — BP 113/90 | Ht 67.0 in | Wt 126.0 lb

## 2014-10-17 DIAGNOSIS — S4292XD Fracture of left shoulder girdle, part unspecified, subsequent encounter for fracture with routine healing: Secondary | ICD-10-CM

## 2014-10-17 NOTE — Patient Instructions (Signed)
Start physical therapy  The sling can be used for comfort only and does not have to be worn full-time anymore  Follow-up in 8 weeks

## 2014-10-17 NOTE — Progress Notes (Signed)
Fracture care follow-up  Chief Complaint  Patient presents with  . Follow-up    follow up + xray Left humerus fx, DOI 09/25/14    Proximal left humerus fracture treated with sling. Patient complains of foot pain left ankle foot night time symptoms  Pain medication doing well with left humerus fracture.  X-rays show fracture stable healed  Start physical therapy 3 times a week for 6 weeks  Follow-up 8 weeks  Patient is weightbearing as tolerated, therapy should include passive and active assisted range of motion and progressive resistance exercises with no weightbearing restrictions

## 2014-10-18 ENCOUNTER — Telehealth: Payer: Self-pay | Admitting: Family Medicine

## 2014-10-18 ENCOUNTER — Telehealth: Payer: Self-pay | Admitting: Orthopedic Surgery

## 2014-10-18 NOTE — Telephone Encounter (Signed)
Received 60 tabs of hydrocodone 5/325 on 3/22 and then another 84 pills from [facility] on 3/23. No pills will be given from this office.

## 2014-10-18 NOTE — Telephone Encounter (Signed)
Call received from [facility] physical therapist regarding any orders for patient related Left humerus fracture DOI 09/25/14*  * Per Baldomero Lamy, LPN, the orders were faxed to [facility] yesterday, 10/17/14.

## 2014-10-21 ENCOUNTER — Telehealth: Payer: Self-pay | Admitting: Orthopedic Surgery

## 2014-10-21 ENCOUNTER — Other Ambulatory Visit: Payer: Self-pay | Admitting: *Deleted

## 2014-10-21 ENCOUNTER — Telehealth: Payer: Self-pay | Admitting: Family Medicine

## 2014-10-21 ENCOUNTER — Ambulatory Visit: Payer: Self-pay | Admitting: Orthopedic Surgery

## 2014-10-21 MED ORDER — HYDROCODONE-ACETAMINOPHEN 7.5-325 MG PO TABS: 1.0000 | ORAL_TABLET | ORAL | Status: DC | PRN

## 2014-10-21 NOTE — Telephone Encounter (Signed)
Pt no longer on pain contract with this office, she regularly gets pain meds from Dr Ruthe Mannan office, he is best able to treat her chronic joint pain

## 2014-10-21 NOTE — Telephone Encounter (Signed)
Erin [name] has questions regarding Ms. Bauder if you could please return her call asap!! thanks

## 2014-10-21 NOTE — Telephone Encounter (Signed)
Both were filled within 1 day of each other. Please advise

## 2014-10-21 NOTE — Telephone Encounter (Signed)
Pls let caller know that I will no longer be responsible for prescribing pt's pain medication ,let them know , I am aware of info below which is the reason that I will not be prescribing anymore. Since she has knee pain, I will leave onl;y D Dr Aline Brochure to prescribe medication for this.  Pls explain , that by LAW, one prescriber ONLY should prescribe pain meds on a regular basis , and the reason for this is for PATIENT SAFETY

## 2014-10-21 NOTE — Telephone Encounter (Signed)
Therapist is requesting to add OT, advised okay

## 2014-10-22 NOTE — Telephone Encounter (Signed)
Noted  

## 2014-10-22 NOTE — Telephone Encounter (Signed)
As of Monday, 10/21/14, nurse called back to [facility] and provided verbal orders.

## 2014-10-22 NOTE — Telephone Encounter (Signed)
Tried to call son again no answer  Another script was collected from [facility] office yesterday. Will make son aware he is to continue getting scripts from [facility] when he calls

## 2014-10-24 ENCOUNTER — Ambulatory Visit (HOSPITAL_COMMUNITY): Payer: Self-pay | Admitting: Psychiatry

## 2014-10-25 ENCOUNTER — Other Ambulatory Visit: Payer: Self-pay | Admitting: *Deleted

## 2014-10-28 ENCOUNTER — Other Ambulatory Visit: Payer: Self-pay | Admitting: Neurology

## 2014-10-28 NOTE — Telephone Encounter (Signed)
Last OV note says: The patient is currently off of the Topamax

## 2014-11-11 ENCOUNTER — Telehealth: Payer: Self-pay | Admitting: Neurology

## 2014-11-11 MED ORDER — GABAPENTIN 100 MG PO CAPS: 100.0000 mg | ORAL_CAPSULE | Freq: Three times a day (TID) | ORAL | Status: DC

## 2014-11-11 NOTE — Telephone Encounter (Signed)
Patient's son is calling because his mother is having pain in her back which is shooting pain into her heel and arch of foot. He has an understanding from a pharamist that there is a medication that will stop the back nerves from sending the pain to the foot area??? Please call.

## 2014-11-11 NOTE — Telephone Encounter (Signed)
I called the patient. I talk with a son. The patient had a lot of back pain and foot pain. I will call in low dose gabapentin to see if this is beneficial for her. They will call me if she is still having problems.

## 2014-11-20 ENCOUNTER — Ambulatory Visit (INDEPENDENT_AMBULATORY_CARE_PROVIDER_SITE_OTHER): Payer: Medicare Other | Admitting: Neurology

## 2014-11-20 ENCOUNTER — Encounter: Payer: Self-pay | Admitting: Neurology

## 2014-11-20 VITALS — BP 144/82 | HR 82 | Ht 66.0 in

## 2014-11-20 DIAGNOSIS — I6930 Unspecified sequelae of cerebral infarction: Secondary | ICD-10-CM | POA: Insufficient documentation

## 2014-11-20 DIAGNOSIS — G63 Polyneuropathy in diseases classified elsewhere: Secondary | ICD-10-CM

## 2014-11-20 DIAGNOSIS — R2681 Unsteadiness on feet: Secondary | ICD-10-CM | POA: Diagnosis not present

## 2014-11-20 DIAGNOSIS — R413 Other amnesia: Secondary | ICD-10-CM

## 2014-11-20 DIAGNOSIS — I639 Cerebral infarction, unspecified: Secondary | ICD-10-CM

## 2014-11-20 HISTORY — DX: Unspecified sequelae of cerebral infarction: I69.30

## 2014-11-20 MED ORDER — MIRTAZAPINE 45 MG PO TABS: 45.0000 mg | ORAL_TABLET | Freq: Every day | ORAL | Status: DC

## 2014-11-20 NOTE — Patient Instructions (Signed)

## 2014-11-20 NOTE — Progress Notes (Signed)
Reason for visit: Gait disorder  Erin [name] is an 81 y.o. female  History of present illness:  Erin [name] is an 81 year old right-handed white female with a history of cerebrovascular disease, with a right brain stroke and a left hemiparesis. The patient has had a gait disorder since that time, she is on Eliquis for atrial fibrillation. The patient has had peripheral neuropathy symptoms with pain in the feet that is worse at nighttime. The patient has done well initially on Remeron taking 30 mg night sleep. The patient has had an increase in the discomfort over the last couple months, and she was placed on gabapentin about one month ago. She is on 100 mg 3 times daily. Since being on this medication, she has had a significant decline in her ability to walk. She has a tendency to lean to the left and to lean backwards with standing. The patient can walk with a walker with assistance at this point. The patient has had no change in memory. She takes low-dose alprazolam at night as well for sleep. Her appetite has been good. The patient returns for an evaluation.  Past Medical History  Diagnosis Date  . Anxiety   . Arthritis   . Hyperlipidemia   . Essential hypertension, benign   . Osteoporosis   . GERD (gastroesophageal reflux disease)   . Hearing loss   . Restless leg   . Tremor   . Leg edema   . Obese   . Gall bladder disease   . Cataract   . Carpal tunnel syndrome of left wrist   . Atrial fibrillation   . Sleep apnea   . Esophageal dilatation   . Frequent falls   . History of nuclear stress test 08/31/2012    Lexiscan cardiolite negative for ischemia  . Polyneuropathy in other diseases classified elsewhere 10/03/2013  . Cardioembolic stroke 03/50/0938  . UTI (lower urinary tract infection)   . History of stroke with current residual effects 11/20/2014    Past Surgical History  Procedure Laterality Date  . Appendectomy    . Cholecystectomy    . Abdominal hysterectomy     . Fracture surgery      Left arm x4  . Benign cyst removed from kidney and lung, bilateral mastectomy    . Bilateral great toenail removal    . Knee arthroscopy  2012    Right knee, Dr Aline Brochure  . Left forearm    . Breast surgery      Bilateral 2000  . Tonsillectomy    . R hand surgery    . Bronchoscopy  02/15/2000  . Right vats.  "  . Right upper lobe wedge resection.  "  . Upper gastrointestinal endoscopy  11/25/1999    Esophagitis/ Normal proximal esophagus, stomach and duodenum  . Colonoscopy  01/17/2009    Dr. Deatra Ina : Internal hemorrhoids/Diverticula, scattered in the ascending colon/  Moderate diverticulosis ascending colon to sigmoid colon  . Esophagogastroduodenoscopy   04/23/2003    Dr. Deatra Ina: HIATAL HERNIA  . Colonoscopy  01/16/2001    Normal  . Cataract extraction, bilateral    . Esophagogastroduodenoscopy  03/20/2012    HWE:XHBZJIRC ring-LIKELY CAUSING MILD DYSPHAGIA/SMALL hiatal hernia/Multiple sessile polyps ranging between 3-23mm , path benign  . Cataract extraction    . Carpal tunnel release      Left wrist  . Transthoracic echocardiogram  06/24/2009    EF=>55%, mild assymetric LVH; LA mildly dilated; mild mitral annular calcif, borderline MVP, mild-mod  MR; mild-mod TR, RV systolic pressure elevated, mild pulm HTN; AV mildly sclerotic; mild pulm valve regurg - ordered r/t bradycardia   . Endovenous ablation saphenous vein w/ laser  01/2011    Right GSV  . Cholecystectomy    . Intramedullary (im) nail intertrochanteric Left 03/25/2014    Procedure: INTRAMEDULLARY NAIL INTERTROCHANTRIC LEFT HIP;  Surgeon: Marianna Payment, MD;  Location: [facility];  Service: Orthopedics;  Laterality: Left;    Family History  Problem Relation Age of Onset  . Diabetes Mother   . Heart disease Mother   . Stroke Mother   . Cancer Sister     KIDNEY  . Emphysema Sister   . Heart disease Brother   . Heart disease Sister   . Emphysema Sister   . Cancer Sister     BREAST  . Heart  disease Brother   . Colon cancer Sister   . Alzheimer's disease Father   . Heart disease Sister   . Tremor Sister   . Tremor Brother   . Alcoholism Child   . Cancer Brother   . Pancreatic cancer Brother   . Diabetes Son     Social history:  reports that she has never smoked. She has never used smokeless tobacco. She reports that she does not drink alcohol or use illicit drugs.    Allergies  Allergen Reactions  . Codeine Hives  . Morphine And Related Itching and Other (See Comments)    Makes pt hallucinate  . Actonel [Risedronate Sodium] Other (See Comments)    Abdominal pain  . Fosamax [Alendronate Sodium] Other (See Comments)    Abdominal pian  . Losartan Other (See Comments)    Hospitalized in 06/2013 with pancreatitis, losartan is associated with increased risk of pancreatitis ,  Hence discontinued  . Nitrofurantoin Other (See Comments)    Hospitalized with pancreatitis in 06/2013. Nitrofurantoin implicated as a possible cause    Medications:  Prior to Admission medications   Medication Sig Start Date End Date Taking? Authorizing Provider  ALPRAZolam (XANAX) 0.25 MG tablet Take 0.5 tablets (0.125 mg total) by mouth 2 (two) times daily as needed for anxiety. 10/10/14  Yes Kathrynn Ducking, MD  apixaban (ELIQUIS) 5 MG TABS tablet Take 1 tablet (5 mg total) by mouth 2 (two) times daily. 07/30/14  Yes Kathrynn Ducking, MD  busPIRone (BUSPAR) 5 MG tablet Take 1 tablet (5 mg total) by mouth 2 (two) times daily. 07/29/14  Yes Fayrene Helper, MD  donepezil (ARICEPT) 5 MG tablet Take 1 tablet (5 mg total) by mouth at bedtime. 07/29/14  Yes Fayrene Helper, MD  HYDROcodone-acetaminophen (NORCO/VICODIN) 5-325 MG per tablet Take 0.5-1 tablets by mouth every 4 (four) hours as needed for moderate pain. 10/01/14  Yes Fayrene Helper, MD  magnesium hydroxide (MILK OF MAGNESIA) 400 MG/5ML suspension Take by mouth daily as needed for mild constipation.   Yes Historical Provider, MD    metoprolol (LOPRESSOR) 50 MG tablet TAKE ONE-HALF TABLET BY MOUTH THREE TIMES DAILY 09/04/14  Yes Fayrene Helper, MD  mirtazapine (REMERON) 45 MG tablet Take 1 tablet (45 mg total) by mouth at bedtime. 11/20/14  Yes Kathrynn Ducking, MD  montelukast (SINGULAIR) 10 MG tablet Take 1 tablet (10 mg total) by mouth at bedtime. 08/12/14  Yes Fayrene Helper, MD  mupirocin ointment (BACTROBAN) 2 % Place 1 application into the nose 2 (two) times daily. 07/15/14  Yes Annita Brod, MD  omeprazole (PRILOSEC) 20 MG capsule  Take 1 capsule (20 mg total) by mouth 2 (two) times daily before a meal. Take 1 capsule by mouth twice daily at 6:30am and 9:00pm. 07/29/14  Yes Fayrene Helper, MD    ROS:  Out of a complete 14 system review of symptoms, the patient complains only of the following symptoms, and all other reviewed systems are negative.  Restless legs Joint pain, back pain, muscle cramps, walking difficulty  Blood pressure 144/82, pulse 82, height 5\' 6"  (1.676 m).  Physical Exam  General: The patient is alert and cooperative at the time of the examination.  Skin: 1-2+ edema below the knees is noted bilaterally.   Neurologic Exam  Mental status: The patient is alert and oriented x 3 at the time of the examination. The patient has apparent normal recent and remote memory, with an apparently normal attention span and concentration ability.   Cranial nerves: Facial symmetry is present. Speech is normal, no aphasia or dysarthria is noted. Extraocular movements are full. Visual fields are full.  Motor: The patient has good strength in all 4 extremities, with exception that there is 4/5 strength with the left arm.  Sensory examination: Soft touch sensation is symmetric on the face, arms, and legs.  Coordination: The patient has good finger-nose-finger and heel-to-shin bilaterally, with exception that the patient has some difficulty performing finger-nose-finger with the left arm. Intention  tremors are seen bilaterally with the arms.  Gait and station: The patient requires assistance with standing. Once up, she tends to lean to the left and lean backwards. The patient is able to take a few steps with assistance only. Romberg is positive, the patient goes backwards.  Reflexes: Deep tendon reflexes are symmetric.   Assessment/Plan:  1. Cerebrovascular disease, left hemiparesis    2. Gait disorder  3. Mild memory disorder  4. Peripheral neuropathy  The patient has had a significant worsening of her peripheral neuropathy pain, but she has had a decline in her ability to ambulate within the last month or so. The patient will be set up for CT scan of the brain, and she will be taken off of the gabapentin. She will have an increase in the Remeron dose taking 45 mg at night. She will follow-up in about 4 months.  Jill Alexanders MD 11/20/2014 8:32 PM  [facility] [address] [place],  [address]  Phone [phone] Fax [phone]

## 2014-11-23 ENCOUNTER — Other Ambulatory Visit: Payer: Self-pay | Admitting: Family Medicine

## 2014-11-25 ENCOUNTER — Other Ambulatory Visit: Payer: Self-pay | Admitting: Family Medicine

## 2014-11-27 ENCOUNTER — Telehealth: Payer: Self-pay | Admitting: Neurology

## 2014-11-27 ENCOUNTER — Ambulatory Visit
Admission: RE | Admit: 2014-11-27 | Discharge: 2014-11-27 | Disposition: A | Discharge status: Home / Self Care | Payer: Medicare Other | Source: Ambulatory Visit | Attending: Neurology | Admitting: Neurology

## 2014-11-27 ENCOUNTER — Other Ambulatory Visit: Payer: Self-pay

## 2014-11-27 DIAGNOSIS — R413 Other amnesia: Secondary | ICD-10-CM

## 2014-11-27 DIAGNOSIS — R2681 Unsteadiness on feet: Secondary | ICD-10-CM

## 2014-11-27 DIAGNOSIS — G63 Polyneuropathy in diseases classified elsewhere: Secondary | ICD-10-CM

## 2014-11-27 DIAGNOSIS — I6930 Unspecified sequelae of cerebral infarction: Secondary | ICD-10-CM

## 2014-11-27 NOTE — Telephone Encounter (Signed)
I called, and I talked with the caretaker. The CT the head shows no change from prior study. This does not explain the recent change in functional level.   CT head results 11/27/2014:  IMPRESSION:  Abnormal CT head (without) demonstrating: 1. Chronic right MCA ischemic infarction. 2. Moderate chronic small vessel ischemic disease. Moderate ventriculomegaly on ex vacuo basis. 3. Mild bifrontal atrophy.  4. No acute findings. No change from CT on 09/29/14.

## 2014-11-28 ENCOUNTER — Ambulatory Visit (INDEPENDENT_AMBULATORY_CARE_PROVIDER_SITE_OTHER): Payer: Medicare Other | Admitting: Family Medicine

## 2014-11-28 ENCOUNTER — Other Ambulatory Visit: Payer: Self-pay | Admitting: Family Medicine

## 2014-11-28 ENCOUNTER — Encounter: Payer: Self-pay | Admitting: Family Medicine

## 2014-11-28 ENCOUNTER — Telehealth: Payer: Self-pay | Admitting: Orthopedic Surgery

## 2014-11-28 VITALS — BP 146/80 | HR 98 | Resp 18 | Ht 65.0 in | Wt 133.1 lb

## 2014-11-28 DIAGNOSIS — Z0000 Encounter for general adult medical examination without abnormal findings: Secondary | ICD-10-CM | POA: Insufficient documentation

## 2014-11-28 DIAGNOSIS — J302 Other seasonal allergic rhinitis: Secondary | ICD-10-CM

## 2014-11-28 DIAGNOSIS — R059 Cough, unspecified: Secondary | ICD-10-CM

## 2014-11-28 DIAGNOSIS — R05 Cough: Secondary | ICD-10-CM

## 2014-11-28 DIAGNOSIS — I6930 Unspecified sequelae of cerebral infarction: Secondary | ICD-10-CM | POA: Diagnosis not present

## 2014-11-28 MED ORDER — MONTELUKAST SODIUM 10 MG PO TABS: 10.0000 mg | ORAL_TABLET | Freq: Every day | ORAL | Status: DC

## 2014-11-28 NOTE — Telephone Encounter (Signed)
Patient called to request refill of "pain medication" - is there a medication Dr. Aline Brochure has prescribed for pain? Patient's ph# [phone]

## 2014-11-28 NOTE — Progress Notes (Signed)
Subjective:    Patient ID: Erin [name], female    DOB: February 15, 1930, 81 y.o.   MRN: [id number]  HPI Preventive Screening-Counseling & Management   Patient present here today for a Medicare annual wellness visit.   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 No  Over the past two weeks, have you felt little interest or pleasure in doing things? No  Have you lost interest or pleasure in daily life? No  Do you often feel hopeless? No  Do you cry easily over simple problems? No   Activities of Daily Living  In your present state of health, do you have any difficulty performing the following activities?  Patient with history of stroke.  Assistance needed with ADLs.  Left sided paresis.   Driving?: Yes, transported by family Managing money?: Yes, handled by sons Feeding yourself?:having choking spells Getting from bed to chair?: Yes, limited assist  Climbing a flight of stairs?: Yes, history of cva Preparing food and eating?: Yes, unable to prepare food on her own Bathing or showering?: Yes Getting dressed?: Yes Getting to the toilet?: Yes Using the toilet?:No Moving around from place to place?: yes  Fall Risk Assessment In the past year have you fallen or had a near fall?: Yes Are you currently taking any medications that make you dizzy?:No   Hearing Difficulties: No Do you often ask people to speak up or repeat themselves?:No Do you experience ringing or noises  in your ears?:No Do you have difficulty understanding soft or whispered voices?:No  Cognitive Testing  Alert? Yes Normal Appearance?Yes  Oriented to person? Yes Place? Yes  Time? Yes  Displays appropriate judgment?Yes  Can read the correct time from a watch face? yes Are you having problems remembering things?No  Advanced Directives have been discussed with the patient?Yes and patient has these in place   List the Names of Other Physician/Practitioners you currently use: updated    Indicate any recent Medical Services you may have received from other than [facility] providers in the past year (date may be approximate).   Assessment:    Annual Wellness Exam   Plan:     Medicare Attestation  I have personally reviewed:  The patient's medical and social history  Their use of alcohol, tobacco or illicit drugs  Their current medications and supplements  The patient's functional ability including ADLs,fall risks, home safety risks, cognitive, and hearing and visual impairment  Diet and physical activities  Evidence for depression or mood disorders  The patient's weight, height, BMI, and visual acuity have been recorded in the chart. I have made referrals, counseling, and provided education to the patient based on review of the above and I have provided the patient with a written personalized care plan for preventive services.      Review of Systems     Objective:   Physical Exam BP 146/80 mmHg  Pulse 98  Resp 18  Ht 5\' 5"  (1.651 m)  Wt 133 lb 1.3 oz (60.365 kg)  BMI 22.15 kg/m2  SpO2 97%  Patient alert and oriented and in no cardiopulmonary distress.  HEENT: No facial asymmetry, EOMI,   oropharynx pink and moist.  Neck supple no JVD, no mass. Nasal mucosa eedematous and erythematous Chest: Clear to auscultation bilaterally.No crackles or wheezes  Ext: No edema  MS: Decreased ROM spine, shoulders, hips and knees.    CNS: CN 2-12 intact, grade 3 power,and reduced  tone in left upper and lower extremities, tremor of  hands        Assessment & Plan:  Medicare annual wellness visit, subsequent Annual exam as documented. Counseling done  re healthy lifestyle involving commitment to regular physical activity as able, heart healthy diet.The importance of adequate sleep also discussed. Regular seat belt use and home safety, is also discussed.  Immunization and cancer screening needs are specifically addressed at this visit.    Cough Increased cough due to uncontrolled allergies, singulair added   History of stroke with current residual effects Notes generalized weakness and inability to safely ambulate, weakness, leaning to one side, desires out pt therapy to improve strength, mobility and independence, will refer for out pt PT for 6 weeks   Seasonal allergies Increased and uncontrolled with excess post nasal drainage resulting in cough and congestion, , no fever or chills , started in past 4 days, add singulair

## 2014-11-28 NOTE — Patient Instructions (Signed)
F/u in 3.5 month, call if you need me before  Singulair every day for allergies and cough  You are referred to physical therapy for 6 weeks  Glad you are getting stronger and feeling better  Thanks for choosing [facility], we consider it a privelige to serve you.

## 2014-12-01 NOTE — Assessment & Plan Note (Signed)
Increased cough due to uncontrolled allergies, singulair added

## 2014-12-01 NOTE — Assessment & Plan Note (Signed)
Notes generalized weakness and inability to safely ambulate, weakness, leaning to one side, desires out pt therapy to improve strength, mobility and independence, will refer for out pt PT for 6 weeks

## 2014-12-01 NOTE — Assessment & Plan Note (Signed)
Increased and uncontrolled with excess post nasal drainage resulting in cough and congestion, , no fever or chills , started in past 4 days, add singulair

## 2014-12-01 NOTE — Assessment & Plan Note (Signed)
Annual exam as documented. Counseling done  re healthy lifestyle involving commitment to regular physical activity as able, heart healthy diet.The importance of adequate sleep also discussed. Regular seat belt use and home safety, is also discussed.  Immunization and cancer screening needs are specifically addressed at this visit.

## 2014-12-02 ENCOUNTER — Other Ambulatory Visit: Payer: Self-pay | Admitting: Family Medicine

## 2014-12-02 ENCOUNTER — Other Ambulatory Visit: Payer: Self-pay | Admitting: Licensed Clinical Social Worker

## 2014-12-02 ENCOUNTER — Other Ambulatory Visit: Payer: Self-pay | Admitting: *Deleted

## 2014-12-02 MED ORDER — HYDROCODONE-ACETAMINOPHEN 5-325 MG PO TABS: 1.0000 | ORAL_TABLET | Freq: Four times a day (QID) | ORAL | Status: DC | PRN

## 2014-12-02 NOTE — Patient Outreach (Signed)
Assessment:  Previous documentation in [facility] record.  In previous documentation for Erin [name] in [facility] record, CSW discussed at length with Erin [name] on 09/12/14 the current needs of Erin [name] and the strained home living situation of Erin [name] at that time.  Erin [name] had been living at that time with her son, Erin [name], who had issues with alcohol consumption and was frequently away from home several hours at a time multiple days weekly consuming alcohol.  Erin [name] talked with Erin [name] about these issues and challenges in caring for the needs of his mother.  Erin [name], Erin [name], eventually moved to home of her other son, Erin [name], and has been doing very well since moving to home of Erin [name]. Recently, Erin [name] had medical appointment with primary doctor, Erin [name].  Erin [name] is managing the financial needs of Erin [name] and is managing the daily activities of daily living care needs of Erin [name]. Erin [name] has reported to Erin [name] that Erin [name] is very happy to be at home of Erin [name] and has reported that her needs are being met at home of Erin [name].  CSW called Erin [name] on 12/02/14 and spoke via phone with Erin [name] on 12/02/14.  CSW verified identity of Erin [name].  CSW and Erin [name] spoke of her current living situation and daily support in the home environment. Erin [name] said that currently she receives daily care support for activities of daily living with a home health aide who assists Erin [name] from 7:00 AM to 2:00 PM daily  Erin [name] said she had her prescribed medications. She said she is eating well and sleeping well  She said she is challenged with her left arm/shoulder from a past injury and has difficulty putting weight on her left arm and shoulder. She said she uses a walker to ambulate in the home with the assistance of her aide or family members.  She said she had recent appointment with [name] and that [name] has ordered for her to begin outpatient physical therapy sessions in  [place], [place].  Erin [name] said that she was thinking of moving back to her home  In [place], [place]. She said that her son Erin [name] was doing much better now and she thought she could get the support needed now at her home since Erin [name] was doing much better (Erin [name]'s words). Erin [name] said that if she moved back home her home health aide would help her from 2:00 PM to 10:00 PM each day and that aide would help her go to bed each evening and that Erin [name] would then help Erin [name] with breakfast and beginning of daily activities.  CSW asked Erin [name] if she had discussed these plans with Erin [name]. She said she was beginning to talk with Erin [name] about some of these plans regarding possible move back to her home.  CSW discussed with Erin [name] that Erin [name] and CSW had discussed at length the needs of Erin [name] and wanted to support Erin [name] through the [facility] program.  Erin [name] spoke of her care plan needs at present. CSW and Erin [name] agreed that her care plan for clinical social work would revolve around her cooperating with care providers at this time. These providers would include the home health aide daily assisting Erin [name] and would also include the physical therapist helping Erin [name] on outpatient basis as scheduled. Erin [name] agreed to this care plan.  CSW informed Erin [name] that [facility] would communicate above information to Erin [name] on 12/02/14.  Erin [name] agreed to this plan.  CSW  encouraged Erin [name] or Erin [name] to call CSW at [phone] as needed to address social work needs of Erin [name].  Erin [name] was appreciative of call from [facility] on 12/02/14.   Plan: Erin [name] to take medications as prescribed and to attend scheduled medical appointments. CSW to collaborate with Erin [name] in monitoring needs of Erin [name]. CSW to call Erin [name] in three weeks to assess needs of Erin [name] at that time.  Erin [name].Erin [name] MSW, LCSW Licensed Clinical Social Worker [name] Care [facility] [phone]

## 2014-12-02 NOTE — Telephone Encounter (Signed)
Prescription available, patient aware  

## 2014-12-02 NOTE — Telephone Encounter (Signed)
Per [employer] release form, spoke with patient and with son, Kymberlie Brazeau, who is POA and is working out of town until [employer] this evening, received approval from Environmental education officer, Carmela Hurt, to make a 1-time exception for pick up of patient's prescription today, 12/02/14, by patient's grandson, Nima Bamburg, whom patient also designated as a contact on [employer] form.  Picked up accordingly; copy sent for scanning.

## 2014-12-05 ENCOUNTER — Other Ambulatory Visit: Payer: Self-pay | Admitting: *Deleted

## 2014-12-05 NOTE — Patient Outreach (Signed)
[facility]) Care Management   12/05/2014  Erin [name] 1929/08/08 [id number]  EVEE LISKA is an 81 y.o. female  Subjective:   Goals    . "I want to be able to stay in my own house"     Currently patient is in her own home where her son [name] resides with her; she has privately paid in home care 2pm-10pm daily; her other son [name] [facility]) lives 2 miles away and sees or calls her at least once daily      Objective:  Erin [name] is an 81 year old lady who has a history of HTN, afib, and cardioembolic stroke in September of 2015. [facility] Care Management has been following Erin [name] in the community off and on for > 2 years for management of HTN. Unfortunately, she suffered another significant stroke in January and fell, sustaining a left femur fracture. She was subsequently hospitalized and spent some time at [facility] for rehabilitation after her hospitalization. Erin [name] discharged to her home where her adult son [name] lives with her. Erin [name] did not do well in this setting, unfortunately suffering several falls and a shoulder fracture. Erin [name] and other family members had to go to great lengths and expense to make arrangements for in home care services as her son [name] declined to provide hands on care for her. [facility] Care Management nursing services and social work services made an APS referral. Because Erin [name] insisted that she was satisfied with her situation and was cognitively intact at the time, no changes were made in Erin [name]'s environment or care at the direction of APS. [facility] Care Management provided emergency care cost coverage for a very brief period while arrangements were made for Erin [name] to move to the home of her son who is POA, [employer]. Erin [name] returned to her home, where [place] lives, on Wednesday of this week and a privately paid caregiver is attending to her needs 2pm-10pm M-F. [name] has agreed to make Erin [name]  breakfast and help her with basic morning caregiving.  Erin [name] saw [name] last week. HHPT has been resumed/extended as Erin [name] is still very deconditioned, has severely impaired gait and is very high risk for fall.    BP 150/82 mmHg  Pulse 87  Ht 1.676 m (5\' 6" )  Wt 133 lb (60.328 kg)  BMI 21.48 kg/m2  SpO2 93%  Review of Systems  Constitutional:       Generalized weakness and deconditioning  HENT: Negative.   Eyes: Negative.   Respiratory: Negative.   Cardiovascular: Negative.   Gastrointestinal: Negative.   Genitourinary: Negative.   Musculoskeletal: Positive for myalgias and joint pain.       Complains of various intermittent myalgias and joint pain as well as "left leg falling completely asleep and hurting when I lay down"; recently started on gabapentin by neurology  Skin: Negative.   Neurological: Positive for sensory change and weakness.       Left leg tingling/pain "falling asleep"; recently started on Gabapentin by neurology  Endo/Heme/Allergies: Negative.   Psychiatric/Behavioral: Negative.     Physical Exam  Constitutional: She is oriented to person, place, and time. Vital signs are normal. She appears well-developed and well-nourished. She is active.  Cardiovascular: Normal rate, regular rhythm and intact distal pulses.   Respiratory: Effort normal and breath sounds normal.  GI: Soft. Bowel sounds are normal.  Neurological: She is alert and oriented to person, place, and time.  Gait abnormal.  CVA residual weakness left arm and leg  Skin: Skin is warm, dry and intact.  Psychiatric: She has a normal mood and affect. Her speech is normal and behavior is normal. Judgment and thought content normal. Cognition and memory are normal.    Current Medications:   Current Outpatient Prescriptions  Medication Sig Dispense Refill  . ALPRAZolam (XANAX) 0.25 MG tablet Take 0.5 tablets (0.125 mg total) by mouth 2 (two) times daily as needed for anxiety. 30 tablet  3  . apixaban (ELIQUIS) 5 MG TABS tablet Take 1 tablet (5 mg total) by mouth 2 (two) times daily. 60 tablet 6  . busPIRone (BUSPAR) 5 MG tablet TAKE ONE TABLET BY MOUTH TWICE DAILY 60 tablet 0  . donepezil (ARICEPT) 5 MG tablet TAKE ONE TABLET BY MOUTH AT BEDTIME 30 tablet 3  . gabapentin (NEURONTIN) 100 MG capsule     . HYDROcodone-acetaminophen (NORCO/VICODIN) 5-325 MG per tablet Take 1 tablet by mouth every 6 (six) hours as needed for moderate pain. 30 tablet 0  . magnesium hydroxide (MILK OF MAGNESIA) 400 MG/5ML suspension Take by mouth daily as needed for mild constipation.    . metoprolol (LOPRESSOR) 50 MG tablet TAKE ONE-HALF TABLET BY MOUTH THREE TIMES DAILY 45 tablet 4  . mirtazapine (REMERON) 45 MG tablet Take 1 tablet (45 mg total) by mouth at bedtime. 30 tablet 5  . montelukast (SINGULAIR) 10 MG tablet Take 1 tablet (10 mg total) by mouth at bedtime. 30 tablet 3  . omeprazole (PRILOSEC) 20 MG capsule Take 1 capsule (20 mg total) by mouth 2 (two) times daily before a meal. Take 1 capsule by mouth twice daily at 6:30am and 9:00pm. 60 capsule 3   No current facility-administered medications for this visit.    Functional Status:   In your present state of health, do you have any difficulty performing the following activities: 12/05/2014 10/03/2014  Hearing? N N  Vision? N N  Difficulty concentrating or making decisions? [name]  Walking or climbing stairs? Y Y  Dressing or bathing? Y Y  Doing errands, shopping? [name]  Preparing Food and eating ? Y Y  Using the Toilet? Y Y  In the past six months, have you accidently leaked urine? Y Y  Do you have problems with loss of bowel control? N N  Managing your Medications? Y Y  Managing your Finances? Y N  Housekeeping or managing your Housekeeping? [name]    Fall/Depression Screening:    PHQ 2/9 Scores 12/05/2014 08/12/2014 07/17/2013 02/13/2013  PHQ - 2 Score 0 0 2 0  PHQ- 9 Score - - 3 -    Assessment:  Very frail 81 year old lady who recently  had a stroke. Caregiving has been an issue and while Erin [name] is back in her own home with a paid caregiver 8 h/day x 5 days/week where she says she wishes to be, caregiving is still a concern. Erin [name] left her hospital bed and some other DME at the home of her son [name] "in case this doesn't work out". I asked Erin [name] and both her sons to assure that she had needed DME in whichever home she was staying. Mrs. Clanton is deconditioned and has severely impaired gait. HHPT is to resume next week.   Plan:  Very close surveillance by [facility] Management for oversight and assessments.  HHPT by [facility]. Private duty caregivers M-F 2pm-10pm.  Close surveillance and frequent visits by Nadeen Landau.  [facility] CM Care Plan Problem One        Office Visit from 12/05/2014 in [facility] Problem One  Ongoing level of care concerns related to stroke/falls/fracture and family relationship concerns   Care Plan for Problem One  Active   [facility]N Long Term Goal (31-90 days)  in the next 60 days patient and family members/caregivers will collaboarate to provide realistic plan for 24/7 care for patient as evidenced by conversations with patient and caregivers    [facility] Long Term Goal Start Date  12/05/14   Interventions for Problem One Long Term Goal  utiizing teachback method, discussed with patient and both sons the imortance of havin agreement and formalized plan for long term care for patient   [facility] CM Short Term Goal #1 (0-30 days)  in the next 30 days patient and sons will verbalize agreed upon long term plan for 24/7 care for patient   [facility] CM Short Term Goal #1 Start Date  12/05/14   Interventions for Short Term Goal #1  utilizing teachback method, reviewed with patient and sons available options for long term care including continued in home care services (privately paid) and SNF care (if/when appropriate)   [facility] CM Short Term Goal #2 (0-30 days)  in the next 30 days,  established plan for having approriate DME in patient's home and son's home   [facility] CM Short Term Goal #2 Start Date  12/05/14   [facility] CM Short Term Goal #3 (0-30 days)  in the next 14 days patient will resume HHPT as ordered by [name] as evidenced by patient and [facility] report of start of service   [facility] CM Short Term Goal #3 Start Date  12/05/14   Interventions for Short Tern Goal #3  utilizing teachback method, reviewed with patient/sons plan to resume [facility] Care Management  [phone]

## 2014-12-06 IMAGING — CR DG CHEST 2V
3 series · 3 of 3 positions shown · non-contrast
Comparison: 03/23/2014

CLINICAL DATA: Increased white cell count. History of hypertension,
atrial fibrillation, Parkinson disease.

EXAM:
CHEST  2 VIEW

[view not recorded (1 of 3)]
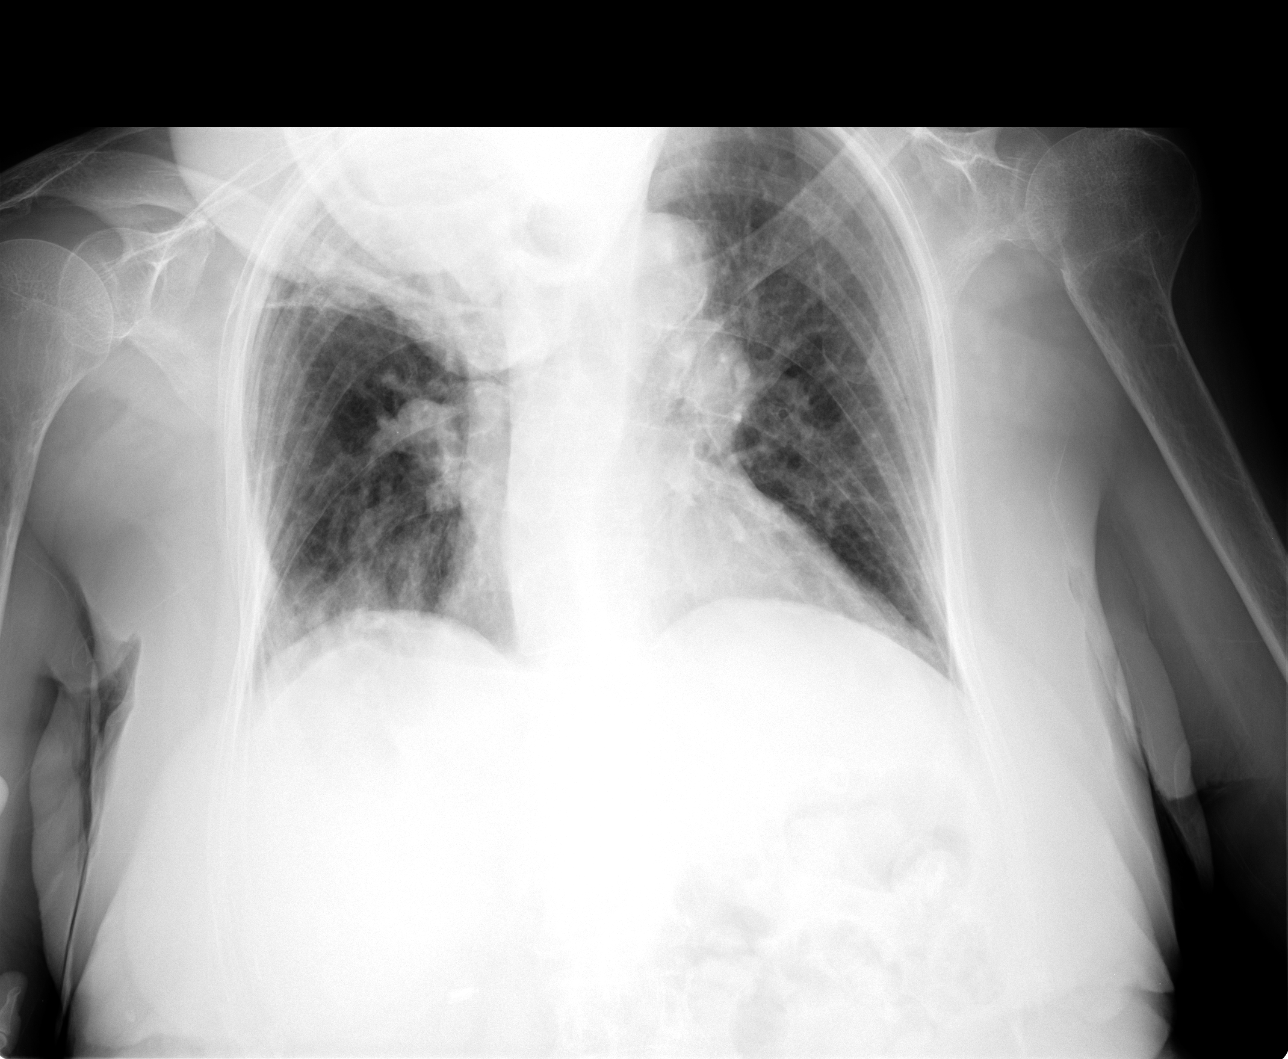

[view not recorded (2 of 3)]
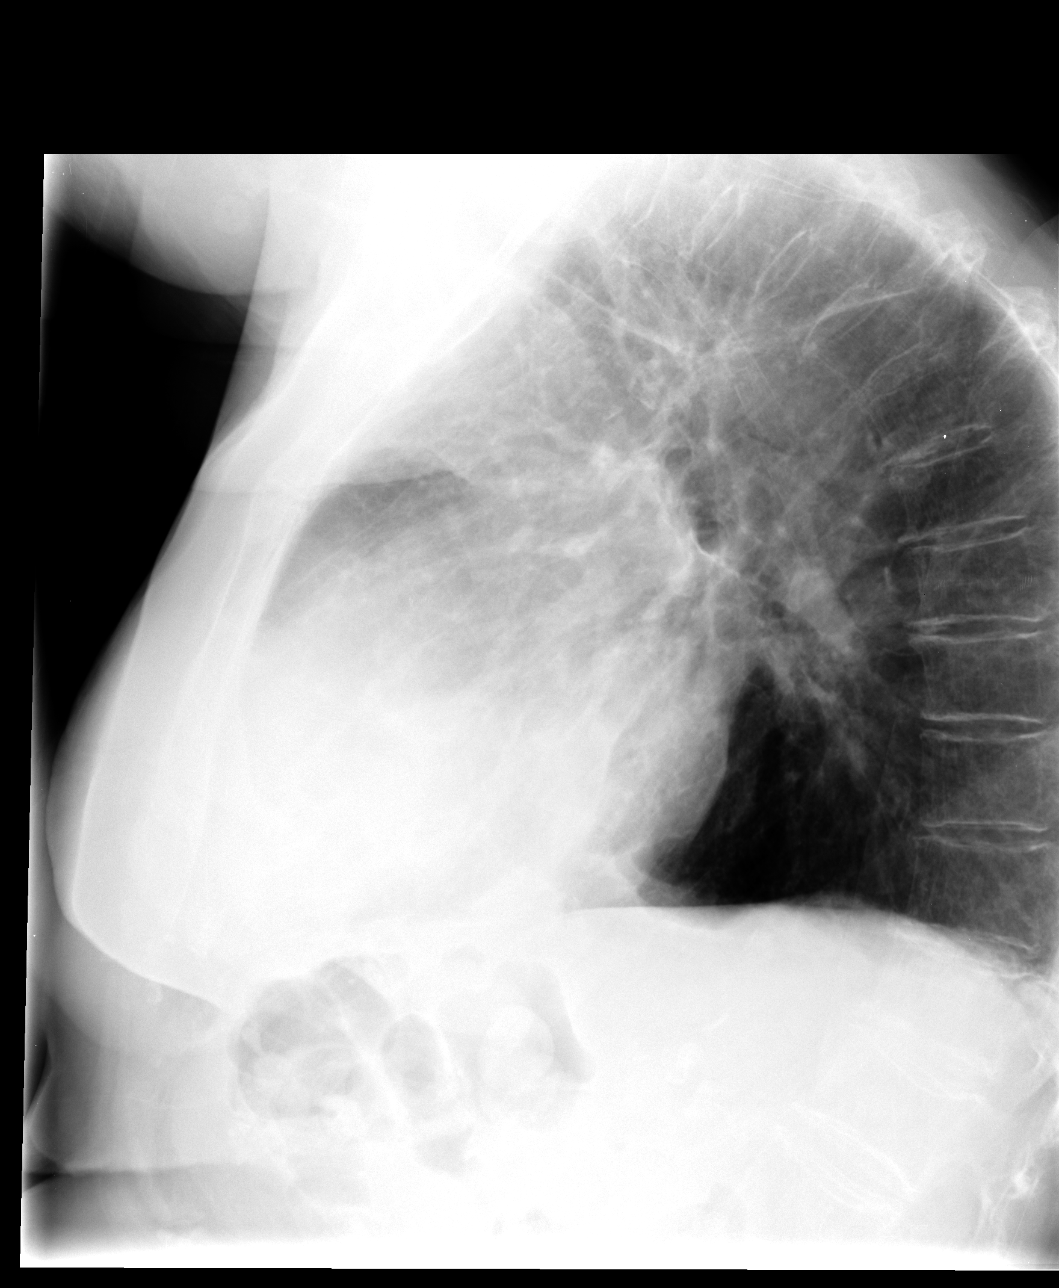

[view not recorded (3 of 3)]
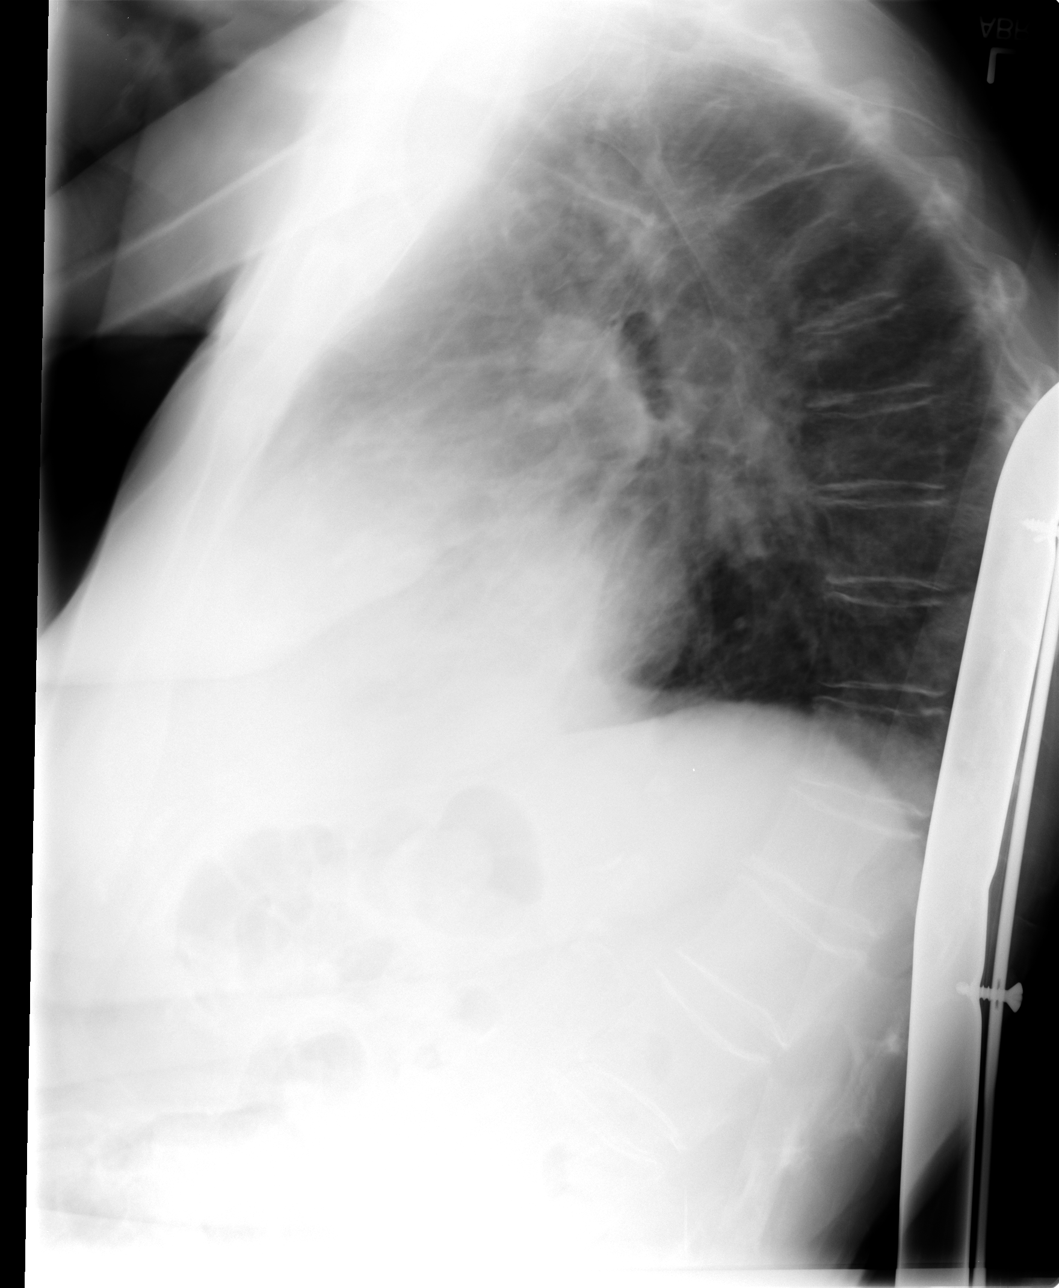

[3 of 3 positions shown; findings below may reference images not displayed]

FINDINGS: Technically limited study due to patient positioning and kyphosis.
Motion artifact. Shallow inspiration. Heart size and pulmonary
vascularity are normal for technique. No focal airspace disease is
suggested in the lungs. Postoperative changes and scarring in the
right upper lung. No blunting of costophrenic angles. No
pneumothorax. Calcification of the aorta.
IMPRESSION: Shallow inspiration.  No evidence of active pulmonary disease.

## 2014-12-12 ENCOUNTER — Ambulatory Visit: Payer: Medicare Other

## 2014-12-12 ENCOUNTER — Ambulatory Visit (INDEPENDENT_AMBULATORY_CARE_PROVIDER_SITE_OTHER): Payer: Self-pay | Admitting: Orthopedic Surgery

## 2014-12-12 VITALS — BP 141/82 | Ht 66.0 in | Wt 133.0 lb

## 2014-12-12 DIAGNOSIS — S42202D Unspecified fracture of upper end of left humerus, subsequent encounter for fracture with routine healing: Secondary | ICD-10-CM

## 2014-12-12 NOTE — Progress Notes (Signed)
Patient ID: Erin [name], female   DOB: Apr 13, 1930, 81 y.o.   MRN: [id number] Status post proximal humerus fracture fracture care follow-up  Encounter Diagnosis  Name Primary?  . Proximal humerus fracture, left, with routine healing, subsequent encounter Yes    Chief Complaint  Patient presents with  . Follow-up    8 week recheck on left humerus fracture. DOI 09-25-14.    Looks like she still having some discomfort in her shoulder but she has chronic pain. She is on hydrocodone. I wouldn't go any higher than that. I explained to her about narcotics O POS there are dangerous and that they need to be used on a sparing basis  She will undergo some physical therapy as an outpatient and see me in 2 months

## 2014-12-16 ENCOUNTER — Encounter (HOSPITAL_COMMUNITY)
Admission: RE | Admit: 2014-12-16 | Discharge: 2014-12-16 | Disposition: A | Discharge status: Home / Self Care | Payer: Medicare Other | Source: Ambulatory Visit | Attending: "Endocrinology | Admitting: "Endocrinology

## 2014-12-16 ENCOUNTER — Other Ambulatory Visit: Payer: Self-pay | Admitting: Family Medicine

## 2014-12-16 ENCOUNTER — Encounter (HOSPITAL_COMMUNITY): Admission: RE | Admit: 2014-12-16 | Payer: Medicare Other | Source: Ambulatory Visit

## 2014-12-17 ENCOUNTER — Telehealth: Payer: Self-pay | Admitting: Orthopedic Surgery

## 2014-12-17 ENCOUNTER — Other Ambulatory Visit: Payer: Self-pay | Admitting: *Deleted

## 2014-12-17 MED ORDER — HYDROCODONE-ACETAMINOPHEN 5-325 MG PO TABS: 1.0000 | ORAL_TABLET | Freq: Four times a day (QID) | ORAL | Status: DC | PRN

## 2014-12-17 NOTE — Telephone Encounter (Signed)
Prescription available, patient aware  

## 2014-12-17 NOTE — Telephone Encounter (Signed)
Patient is calling to request a medication refill on HYDROcodone-acetaminophen (NORCO/VICODIN) 5-325 MG per tablet please advise?

## 2014-12-18 ENCOUNTER — Encounter (HOSPITAL_COMMUNITY)
Admission: RE | Admit: 2014-12-18 | Discharge: 2014-12-18 | Disposition: A | Discharge status: Home / Self Care | Payer: Medicare Other | Source: Ambulatory Visit | Attending: "Endocrinology | Admitting: "Endocrinology

## 2014-12-18 DIAGNOSIS — M810 Age-related osteoporosis without current pathological fracture: Secondary | ICD-10-CM | POA: Insufficient documentation

## 2014-12-18 LAB — COMPREHENSIVE METABOLIC PANEL
ALT: 14 U/L (ref 14–54)
AST: 21 U/L (ref 15–41)
Albumin: 3.7 g/dL (ref 3.5–5.0)
Alkaline Phosphatase: 134 U/L — ABNORMAL HIGH (ref 38–126)
Anion gap: 9 (ref 5–15)
BILIRUBIN TOTAL: 0.6 mg/dL (ref 0.3–1.2)
BUN: 18 mg/dL (ref 6–20)
CO2: 31 mmol/L (ref 22–32)
Calcium: 9 mg/dL (ref 8.9–10.3)
Chloride: 104 mmol/L (ref 101–111)
Creatinine, Ser: 0.61 mg/dL (ref 0.44–1.00)
GFR calc Af Amer: >60 mL/min (ref >60)
Glucose, Bld: 108 mg/dL — ABNORMAL HIGH (ref 65–99)
Potassium: 4.1 mmol/L (ref 3.5–5.1)
Sodium: 144 mmol/L (ref 135–145)
Total Protein: 6.7 g/dL (ref 6.5–8.1)

## 2014-12-18 LAB — MAGNESIUM: MAGNESIUM: 1.9 mg/dL (ref 1.7–2.4)

## 2014-12-18 LAB — PHOSPHORUS: Phosphorus: 4.2 mg/dL (ref 2.5–4.6)

## 2014-12-18 MED ORDER — ZOLEDRONIC ACID 5 MG/100ML IV SOLN
INTRAVENOUS | Status: AC
  Filled 2014-12-18: qty 100

## 2014-12-18 MED ORDER — SODIUM CHLORIDE 0.9 % IV SOLN: INTRAVENOUS | Status: DC

## 2014-12-18 MED ORDER — ZOLEDRONIC ACID 5 MG/100ML IV SOLN
5.0000 mg | Freq: Once | INTRAVENOUS | Status: AC
  Administered 2014-12-18: 5 mg via INTRAVENOUS

## 2014-12-18 NOTE — Progress Notes (Signed)
Pt arrived for reclast. Tolerated med well.

## 2014-12-19 ENCOUNTER — Emergency Department (HOSPITAL_COMMUNITY)
Admission: EM | Admit: 2014-12-19 | Discharge: 2014-12-19 | Disposition: A | Discharge status: Home / Self Care | Payer: Medicare Other | Attending: Emergency Medicine | Admitting: Emergency Medicine

## 2014-12-19 ENCOUNTER — Emergency Department (HOSPITAL_COMMUNITY): Payer: Medicare Other

## 2014-12-19 ENCOUNTER — Encounter (HOSPITAL_COMMUNITY): Payer: Self-pay

## 2014-12-19 ENCOUNTER — Telehealth: Payer: Self-pay | Admitting: Family Medicine

## 2014-12-19 DIAGNOSIS — R569 Unspecified convulsions: Secondary | ICD-10-CM | POA: Diagnosis present

## 2014-12-19 DIAGNOSIS — Z79899 Other long term (current) drug therapy: Secondary | ICD-10-CM | POA: Diagnosis not present

## 2014-12-19 DIAGNOSIS — Z7902 Long term (current) use of antithrombotics/antiplatelets: Secondary | ICD-10-CM | POA: Diagnosis not present

## 2014-12-19 DIAGNOSIS — I10 Essential (primary) hypertension: Secondary | ICD-10-CM | POA: Diagnosis not present

## 2014-12-19 DIAGNOSIS — F419 Anxiety disorder, unspecified: Secondary | ICD-10-CM | POA: Diagnosis not present

## 2014-12-19 DIAGNOSIS — K219 Gastro-esophageal reflux disease without esophagitis: Secondary | ICD-10-CM | POA: Insufficient documentation

## 2014-12-19 DIAGNOSIS — Z8673 Personal history of transient ischemic attack (TIA), and cerebral infarction without residual deficits: Secondary | ICD-10-CM | POA: Diagnosis not present

## 2014-12-19 DIAGNOSIS — Z87440 Personal history of urinary (tract) infections: Secondary | ICD-10-CM | POA: Insufficient documentation

## 2014-12-19 DIAGNOSIS — E669 Obesity, unspecified: Secondary | ICD-10-CM | POA: Insufficient documentation

## 2014-12-19 DIAGNOSIS — M1990 Unspecified osteoarthritis, unspecified site: Secondary | ICD-10-CM | POA: Diagnosis not present

## 2014-12-19 DIAGNOSIS — G40109 Localization-related (focal) (partial) symptomatic epilepsy and epileptic syndromes with simple partial seizures, not intractable, without status epilepticus: Secondary | ICD-10-CM | POA: Insufficient documentation

## 2014-12-19 DIAGNOSIS — H9190 Unspecified hearing loss, unspecified ear: Secondary | ICD-10-CM | POA: Insufficient documentation

## 2014-12-19 LAB — BASIC METABOLIC PANEL
Anion gap: 9 (ref 5–15)
BUN: 17 mg/dL (ref 6–20)
CALCIUM: 8.8 mg/dL — AB (ref 8.9–10.3)
CHLORIDE: 104 mmol/L (ref 101–111)
CO2: 31 mmol/L (ref 22–32)
Creatinine, Ser: 0.64 mg/dL (ref 0.44–1.00)
GFR calc Af Amer: >60 mL/min (ref >60)
GFR calc non Af Amer: >60 mL/min (ref >60)
GLUCOSE: 97 mg/dL (ref 65–99)
POTASSIUM: 3.8 mmol/L (ref 3.5–5.1)
SODIUM: 144 mmol/L (ref 135–145)

## 2014-12-19 LAB — CBC WITH DIFFERENTIAL/PLATELET
Basophils Absolute: 0 10*3/uL (ref 0.0–0.1)
Basophils Relative: 0 % (ref 0–1)
Eosinophils Absolute: 0.2 10*3/uL (ref 0.0–0.7)
Eosinophils Relative: 4 % (ref 0–5)
HCT: 41.6 % (ref 36.0–46.0)
HEMOGLOBIN: 13 g/dL (ref 12.0–15.0)
LYMPHS PCT: 31 % (ref 12–46)
Lymphs Abs: 1.7 10*3/uL (ref 0.7–4.0)
MCH: 32.3 pg (ref 26.0–34.0)
MCHC: 31.3 g/dL (ref 30.0–36.0)
MCV: 103.5 fL — ABNORMAL HIGH (ref 78.0–100.0)
Monocytes Absolute: 0.4 10*3/uL (ref 0.1–1.0)
Monocytes Relative: 8 % (ref 3–12)
NEUTROS ABS: 3.1 10*3/uL (ref 1.7–7.7)
Neutrophils Relative %: 57 % (ref 43–77)
PLATELETS: 190 10*3/uL (ref 150–400)
RBC: 4.02 MIL/uL (ref 3.87–5.11)
RDW: 12.8 % (ref 11.5–15.5)
WBC: 5.4 10*3/uL (ref 4.0–10.5)

## 2014-12-19 LAB — CBG MONITORING, ED: Glucose-Capillary: 84 mg/dL (ref 65–99)

## 2014-12-19 MED ORDER — LORAZEPAM 1 MG PO TABS
1.0000 mg | ORAL_TABLET | Freq: Once | ORAL | Status: AC
  Administered 2014-12-19: 1 mg via ORAL
  Filled 2014-12-19: qty 1

## 2014-12-19 MED ORDER — SODIUM CHLORIDE 0.9 % IV SOLN
500.0000 mg | Freq: Once | INTRAVENOUS | Status: DC
  Filled 2014-12-19: qty 5

## 2014-12-19 MED ORDER — LEVETIRACETAM IN NACL 500 MG/100ML IV SOLN
500.0000 mg | Freq: Once | INTRAVENOUS | Status: AC
Start: 2014-12-19 — End: 2014-12-19
  Administered 2014-12-19: 500 mg via INTRAVENOUS
  Filled 2014-12-19: qty 100

## 2014-12-19 MED ORDER — LEVETIRACETAM 500 MG PO TABS: 500.0000 mg | ORAL_TABLET | Freq: Two times a day (BID) | ORAL | Status: DC

## 2014-12-19 NOTE — ED Notes (Signed)
Family reports patient received Reclast injection for osteoporosis yesterday. When she woke up this am noted to be shaking all over her body. Family called short stay and were told it was likely an allergic reaction

## 2014-12-19 NOTE — ED Notes (Signed)
Patient transported to CT 

## 2014-12-19 NOTE — ED Notes (Signed)
Awaiting Keppra from pharmacy - discharge delayed.

## 2014-12-19 NOTE — ED Notes (Signed)
Pt returned from CT °

## 2014-12-19 NOTE — ED Notes (Signed)
Family reports pt had an injection yesterday for osteoporosis and today family noted pt was shaking. No SOB, hives or angioedema noted. Family stated they called her doctor and was told to come to ED for eval d/t possible side effect from medication given yesterday.

## 2014-12-19 NOTE — ED Provider Notes (Addendum)
CSN: [id number]     Arrival date & time 12/19/14  1219 History   First MD Initiated Contact with Patient 12/19/14 1306     Chief Complaint  Patient presents with  . Medication Reaction    HPI patient presents the emergency department with uncontrollable shaking of her left lower extremity.  Family reports that is somewhat improved at this time.  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  The stroke was in September 2015.  She's never had seizures or focal seizures.  She states that shaking of her left upper extremity.  It was all isolated to the left lower extremity.  She has a right upper extremity tremor at baseline.  She reports this is much different.  She denies fevers and chills.  No cough or congestion.  No headache.  No altered mental status.  Past Medical History  Diagnosis Date  . Anxiety   . Arthritis   . Hyperlipidemia   . Essential hypertension, benign   . Osteoporosis   . GERD (gastroesophageal reflux disease)   . Hearing loss   . Restless leg   . Tremor   . Leg edema   . Obese   . Gall bladder disease   . Cataract   . Carpal tunnel syndrome of left wrist   . Atrial fibrillation   . Sleep apnea   . Esophageal dilatation   . Frequent falls   . History of nuclear stress test 08/31/2012    Lexiscan cardiolite negative for ischemia  . Polyneuropathy in other diseases classified elsewhere 10/03/2013  . Cardioembolic stroke 40/04/2724  . UTI (lower urinary tract infection)   . History of stroke with current residual effects 11/20/2014   Past Surgical History  Procedure Laterality Date  . Appendectomy    . Cholecystectomy    . Abdominal hysterectomy    . Fracture surgery      Left arm x4  . Benign cyst removed from kidney and lung, bilateral mastectomy    . Bilateral great toenail removal    . Knee arthroscopy  2012    Right knee, Dr Aline Brochure  . Left forearm    . Breast surgery      Bilateral 2000  . Tonsillectomy    . R hand  surgery    . Bronchoscopy  02/15/2000  . Right vats.  "  . Right upper lobe wedge resection.  "  . Upper gastrointestinal endoscopy  11/25/1999    Esophagitis/ Normal proximal esophagus, stomach and duodenum  . Colonoscopy  01/17/2009    Dr. Deatra Ina : Internal hemorrhoids/Diverticula, scattered in the ascending colon/  Moderate diverticulosis ascending colon to sigmoid colon  . Esophagogastroduodenoscopy   04/23/2003    Dr. Deatra Ina: HIATAL HERNIA  . Colonoscopy  01/16/2001    Normal  . Cataract extraction, bilateral    . Esophagogastroduodenoscopy  03/20/2012    [id number] ring-LIKELY CAUSING MILD DYSPHAGIA/SMALL hiatal hernia/Multiple sessile polyps ranging between 3-53mm , path benign  . Cataract extraction    . Carpal tunnel release      Left wrist  . Transthoracic echocardiogram  06/24/2009    EF=>55%, mild assymetric LVH; LA mildly dilated; mild mitral annular calcif, borderline MVP, mild-mod MR; mild-mod TR, RV systolic pressure elevated, mild pulm HTN; AV mildly sclerotic; mild pulm valve regurg - ordered r/t bradycardia   . Endovenous ablation saphenous vein w/ laser  01/2011    Right GSV  . Cholecystectomy    . Intramedullary (im) nail intertrochanteric  Left 03/25/2014    Procedure: INTRAMEDULLARY NAIL INTERTROCHANTRIC LEFT HIP;  Surgeon: Marianna Payment, MD;  Location: [facility];  Service: Orthopedics;  Laterality: Left;   Family History  Problem Relation Age of Onset  . Diabetes Mother   . Heart disease Mother   . Stroke Mother   . Cancer Sister     KIDNEY  . Emphysema Sister   . Heart disease Brother   . Heart disease Sister   . Emphysema Sister   . Cancer Sister     BREAST  . Heart disease Brother   . Colon cancer Sister   . Alzheimer's disease Father   . Heart disease Sister   . Tremor Sister   . Tremor Brother   . Alcoholism Child   . Cancer Brother   . Pancreatic cancer Brother   . Diabetes Son    History  Substance Use Topics  . Smoking status: Never  Smoker   . Smokeless tobacco: Never Used  . Alcohol Use: No   OB History    No data available     Review of Systems  All other systems reviewed and are negative.     Allergies  Codeine; Morphine and related; Actonel; Fosamax; Losartan; and Nitrofurantoin  Home Medications   Prior to Admission medications   Medication Sig Start Date End Date Taking? Authorizing Provider  ALPRAZolam (XANAX) 0.25 MG tablet Take 0.5 tablets (0.125 mg total) by mouth 2 (two) times daily as needed for anxiety. 10/10/14   Kathrynn Ducking, MD  apixaban (ELIQUIS) 5 MG TABS tablet Take 1 tablet (5 mg total) by mouth 2 (two) times daily. 07/30/14   Kathrynn Ducking, MD  busPIRone (BUSPAR) 5 MG tablet TAKE ONE TABLET BY MOUTH TWICE DAILY 11/25/14   Fayrene Helper, MD  donepezil (ARICEPT) 5 MG tablet TAKE ONE TABLET BY MOUTH AT BEDTIME 12/02/14   Fayrene Helper, MD  gabapentin (NEURONTIN) 100 MG capsule  11/11/14   Historical Provider, MD  HYDROcodone-acetaminophen (NORCO/VICODIN) 5-325 MG per tablet Take 1 tablet by mouth every 6 (six) hours as needed for moderate pain. 12/17/14   Carole Civil, MD  magnesium hydroxide (MILK OF MAGNESIA) 400 MG/5ML suspension Take by mouth daily as needed for mild constipation.    Historical Provider, MD  metoprolol (LOPRESSOR) 50 MG tablet TAKE ONE-HALF TABLET BY MOUTH THREE TIMES DAILY 09/04/14   Fayrene Helper, MD  mirtazapine (REMERON) 45 MG tablet Take 1 tablet (45 mg total) by mouth at bedtime. 11/20/14   Kathrynn Ducking, MD  montelukast (SINGULAIR) 10 MG tablet Take 1 tablet (10 mg total) by mouth at bedtime. 11/28/14   Fayrene Helper, MD  omeprazole (PRILOSEC) 20 MG capsule Take 1 capsule (20 mg total) by mouth 2 (two) times daily before a meal. Take 1 capsule by mouth twice daily at 6:30am and 9:00pm. 07/29/14   Fayrene Helper, MD   BP 139/73 mmHg  Pulse 70  Temp(Src) 97.9 F (36.6 C) (Oral)  Ht 5\' 6"  (1.676 m)  Wt 125 lb (56.7 kg)  BMI 20.19 kg/m2   SpO2 95% Physical Exam  Constitutional: She is oriented to person, place, and time. She appears well-developed and well-nourished. No distress.  HENT:  Head: Normocephalic and atraumatic.  Eyes: EOM are normal.  Neck: Normal range of motion.  Cardiovascular: Normal rate, regular rhythm and normal heart sounds.   Pulmonary/Chest: Effort normal and breath sounds normal.  Abdominal: Soft. She exhibits no distension. There is  no tenderness.  Musculoskeletal: Normal range of motion.  Neurological: She is alert and oriented to person, place, and time.  Baseline left upper and left lower extremity weakness consistent with prior stroke.  Rhythmic shaking of left lower extremity noted  Skin: Skin is warm and dry.  Psychiatric: She has a normal mood and affect. Judgment normal.  Nursing note and vitals reviewed.   ED Course  Procedures (including critical care time) Labs Review Labs Reviewed  CBC WITH DIFFERENTIAL/PLATELET - Abnormal; Notable for the following:    MCV 103.5 (*)    All other components within normal limits  BASIC METABOLIC PANEL - Abnormal; Notable for the following:    Calcium 8.8 (*)    All other components within normal limits  CBG MONITORING, ED    Imaging Review Ct Head Wo Contrast  12/19/2014   CLINICAL DATA:  Medication reaction.  New left-sided tremor.  EXAM: CT HEAD WITHOUT CONTRAST  TECHNIQUE: Contiguous axial images were obtained from the base of the skull through the vertex without intravenous contrast.  COMPARISON:  11/27/2014 and 09/29/2014  FINDINGS: No acute intracranial hemorrhage, infarction, or mass lesion. Old right middle cerebral artery infarct with secondary encephalomalacia, unchanged. Diffuse mild cerebral cortical atrophy with secondary ventricular dilatation.  No acute osseous abnormality. Small right frontal an ostosis of the skull. This is not significant.  IMPRESSION: No acute intracranial abnormality.  Old right MCA infarct.   Electronically  Signed   By: Lorriane Shire M.D.   On: 12/19/2014 14:22  I personally reviewed the imaging tests through PACS system I reviewed available ER/hospitalization records through the EMR    EKG Interpretation None      MDM   Final diagnoses:  None   activity of the left lower extremity resolved with benzodiazepines.  I suspect this is a right brain focal seizure.  Patient be started on Keppra.  She's been asked to follow-up with her neurologist.  She's had complete resolution of her symptoms at this time.    Jola Schmidt, MD 12/19/14 [place], MD 12/19/14 [phone]

## 2014-12-19 NOTE — Progress Notes (Signed)
Results for LIALA, CODISPOTI (MRN [id number]) as of 12/19/2014 13:57  Ref. Range 12/18/2014 13:20  Sodium Latest Ref Range: 135-145 mmol/L 144  Potassium Latest Ref Range: 3.5-5.1 mmol/L 4.1  Chloride Latest Ref Range: 101-111 mmol/L 104  CO2 Latest Ref Range: 22-32 mmol/L 31  BUN Latest Ref Range: 6-20 mg/dL 18  Creatinine Latest Ref Range: 0.44-1.00 mg/dL 0.61  Calcium Latest Ref Range: 8.9-10.3 mg/dL 9.0  EGFR (Non-African Amer.) Latest Ref Range: >60 mL/min >60  EGFR (African American) Latest Ref Range: >60 mL/min >60  Glucose Latest Ref Range: 65-99 mg/dL 108 (H)  Anion gap Latest Ref Range: 5-15  9  Phosphorus Latest Ref Range: 2.5-4.6 mg/dL 4.2  Magnesium Latest Ref Range: 1.7-2.4 mg/dL 1.9  Alkaline Phosphatase Latest Ref Range: 38-126 U/L 134 (H)  Albumin Latest Ref Range: 3.5-5.0 g/dL 3.7  AST Latest Ref Range: 15-41 U/L 21  ALT Latest Ref Range: 14-54 U/L 14  Total Protein Latest Ref Range: 6.5-8.1 g/dL 6.7  Total Bilirubin Latest Ref Range: 0.3-1.2 mg/dL 0.6

## 2014-12-19 NOTE — Telephone Encounter (Signed)
Pt seen in ED today recommendation in Ed Doc note to follow up with neurology, she sees Dr Jannifer Franklin, Ed opinion is that she now has new seizure activity and he has started her on kepra. Pls help with coordination of visit. Dr Jannifer Franklin has been following her closely Needs neurology f/u not PCP follow up or this Thanks Contacting [facility] to help facilitate is appropriate

## 2014-12-20 ENCOUNTER — Telehealth: Payer: Self-pay | Admitting: Neurology

## 2014-12-20 NOTE — Telephone Encounter (Signed)
Colletta Maryland called on behalf of the patient, requested that the patient be seen for seizures, Colletta Maryland stated that the patient is seen by Dr. Jannifer Franklin for stroke. Informed her the patient would need a referral and she requested to speak with Dr. Jannifer Franklin. Please call and advise.

## 2014-12-20 NOTE — Telephone Encounter (Signed)
I called Erin [name]. Per ED doctor's note, patient is to follow up with Dr. Jannifer Franklin soon. Appointment made for 6/14.

## 2014-12-21 DIAGNOSIS — Z09 Encounter for follow-up examination after completed treatment for conditions other than malignant neoplasm: Secondary | ICD-10-CM | POA: Insufficient documentation

## 2014-12-21 NOTE — Assessment & Plan Note (Signed)
Generalized weakness and deconditioning following recent acute hospitalization after prolonged in pt care la

## 2014-12-21 NOTE — Assessment & Plan Note (Signed)
Continue aricept daily, low dose currently

## 2014-12-21 NOTE — Assessment & Plan Note (Signed)
Increased nasal congestion and cough, npo fever , add singulair daily

## 2014-12-21 NOTE — Assessment & Plan Note (Signed)
Lifelong anticoagullation

## 2014-12-21 NOTE — Assessment & Plan Note (Signed)
Pt noted to have generalized weakness worse on left side , placing her at increased fall risk. She is referred for physical therapy at home, also her sleep and mental health are not as good as desired and her appetite poor. [facility] services continue to be a backbone in assisting [place] to deliver coordinated care to the best of our ability, direct outreach following this visit will be made so they can be aware of current concerns

## 2014-12-21 NOTE — Assessment & Plan Note (Signed)
Increased anxiety following acute stroke in 2015, buspar and xanax to be continued as before

## 2014-12-21 NOTE — Assessment & Plan Note (Signed)
Sleep hygiene reviewed continue medication

## 2014-12-21 NOTE — Assessment & Plan Note (Signed)
Uncontrolled , with dry cough and increased post nasal drainage, start daily singulair

## 2014-12-21 NOTE — Progress Notes (Signed)
   Subjective:    Patient ID: Erin [name], female    DOB: 1930/02/18, 81 y.o.   MRN: [id number]  HPI Pt in for f/u  Hospitalization from 01/3 to 01/04 for acute encephalopathy C/o dry cough with excess nasal congestion, denies any fever or chills C/o generalizeds weakness, incapable of standing safely for weight at thsi visit. Does report feeling depressed due to failing health, but will work on this C/o generalized pains and body aches   Review of Systems See HPI Denies recent fever or chills. Denies sinus pressure, nasal congestion, ear pain or sore throat. Denies chest congestion, productive cough or wheezing. Denies chest pains, palpitations and leg swelling Denies abdominal pain, nausea, vomiting,diarrhea or constipation.   Denies dysuria, frequency, hesitancy or incontinence. Denies joint pain, swelling and limitation in mobility. Denies headaches, seizures, numbness, or tingling. Denies skin break down or rash.        Objective:   Physical Exam   BP 140/68 mmHg  Pulse 62  Resp 18 Patient alert  and in no cardiopulmonary distress.Chronilcally ill appearing and weak, appears depressed, positive weight loss  HEENT: No facial asymmetry, EOMI,   oropharynx pink and moist.  Neck decreased ROM no JVD, no mass.  Chest: Clear to auscultation bilaterally.  CVS: S1, S2 soft systolic murmur, no S3.Regular rate.  ABD: Soft non tender.   Ext: No edema  MS: Decreased  ROM spine, shoulders, hips and knees.  Skin: Intact, no ulcerations or rash noted.  Psych: Fair eye contact,flat  affect. Memory impaired  depressed appearing.  CNS: CN 2-12 intact,  Decreased power,  And tone in all 4 extremities  noted.      Assessment & Plan:  Seasonal allergies Uncontrolled , with dry cough and increased post nasal drainage, start daily singulair  Allergic rhinitis Increased nasal congestion and cough, npo fever , add singulair daily  Muscle weakness  (generalized) Generalized weakness and deconditioning following recent acute hospitalization after prolonged in pt care la  Essential hypertension, benign Controlled, no change in medication   Chronic atrial fibrillation Lifelong anticoagullation  Senile dementia with behavioral disturbance Continue aricept daily, low dose currently  Insomnia Sleep hygiene reviewed continue medication  Anxiety state Increased anxiety following acute stroke in 2015, buspar and xanax to be continued as before  Hospital discharge follow-up Pt noted to have generalized weakness worse on left side , placing her at increased fall risk. She is referred for physical therapy at home, also her sleep and mental health are not as good as desired and her appetite poor. [facility] services continue to be a backbone in assisting [place] to deliver coordinated care to the best of our ability, direct outreach following this visit will be made so they can be aware of current concerns

## 2014-12-21 NOTE — Assessment & Plan Note (Signed)
Controlled, no change in medication  

## 2014-12-24 ENCOUNTER — Encounter: Payer: Self-pay | Admitting: Neurology

## 2014-12-24 ENCOUNTER — Ambulatory Visit (INDEPENDENT_AMBULATORY_CARE_PROVIDER_SITE_OTHER): Payer: Medicare Other | Admitting: Neurology

## 2014-12-24 VITALS — BP 152/92 | HR 56 | Ht 67.0 in

## 2014-12-24 DIAGNOSIS — I639 Cerebral infarction, unspecified: Secondary | ICD-10-CM

## 2014-12-24 DIAGNOSIS — G63 Polyneuropathy in diseases classified elsewhere: Secondary | ICD-10-CM | POA: Diagnosis not present

## 2014-12-24 DIAGNOSIS — G40109 Localization-related (focal) (partial) symptomatic epilepsy and epileptic syndromes with simple partial seizures, not intractable, without status epilepticus: Secondary | ICD-10-CM

## 2014-12-24 DIAGNOSIS — R269 Unspecified abnormalities of gait and mobility: Secondary | ICD-10-CM

## 2014-12-24 DIAGNOSIS — I6930 Unspecified sequelae of cerebral infarction: Secondary | ICD-10-CM

## 2014-12-24 DIAGNOSIS — R413 Other amnesia: Secondary | ICD-10-CM

## 2014-12-24 HISTORY — DX: Localization-related (focal) (partial) symptomatic epilepsy and epileptic syndromes with simple partial seizures, not intractable, without status epilepticus: G40.109

## 2014-12-24 MED ORDER — GABAPENTIN 100 MG PO CAPS: 100.0000 mg | ORAL_CAPSULE | Freq: Three times a day (TID) | ORAL | Status: DC

## 2014-12-24 NOTE — Progress Notes (Signed)
Reason for visit: Seizures  Erin [name] is an 81 y.o. female  History of present illness:  Erin [name] is an 81 year old right-handed white female with a history of a right middle cerebral artery distribution stroke. The patient has a left hemiparesis, she is essentially nonambulatory. She has an essential tremor that affects the upper extremities, but the tremors are no longer a problem on the hemiparetic side. The patient is having difficulty feeding herself, and she wishes to have treatment for the essential tremor. She also describes ongoing issues with peripheral neuropathy pain and pain in the back, down the left leg and in the neck. The patient finds that her pain is becoming overwhelming for her. On 12/19/2014, the patient had onset of a left focal seizure. The seizure started with the left leg, and then spread to involve the left arm and face. The patient never lost consciousness. The entire event lasted about 15 minutes, and left the patient with increased weakness on the left side. She went to the emergency room, and a CT scan of the brain was done and showed no acute changes. The patient has a placed on Keppra. She returns to the office today for an evaluation.  Past Medical History  Diagnosis Date  . Anxiety   . Arthritis   . Hyperlipidemia   . Essential hypertension, benign   . Osteoporosis   . GERD (gastroesophageal reflux disease)   . Hearing loss   . Restless leg   . Tremor   . Leg edema   . Obese   . Gall bladder disease   . Cataract   . Carpal tunnel syndrome of left wrist   . Atrial fibrillation   . Sleep apnea   . Esophageal dilatation   . Frequent falls   . History of nuclear stress test 08/31/2012    Lexiscan cardiolite negative for ischemia  . Polyneuropathy in other diseases classified elsewhere 10/03/2013  . Cardioembolic stroke 17/40/8144  . UTI (lower urinary tract infection)   . History of stroke with current residual effects 11/20/2014  .  Seizures   . Focal motor seizure disorder 12/24/2014    Left leg involvment    Past Surgical History  Procedure Laterality Date  . Appendectomy    . Cholecystectomy    . Abdominal hysterectomy    . Fracture surgery      Left arm x4  . Benign cyst removed from kidney and lung, bilateral mastectomy    . Bilateral great toenail removal    . Knee arthroscopy  2012    Right knee, Dr Aline Brochure  . Left forearm    . Breast surgery      Bilateral 2000  . Tonsillectomy    . R hand surgery    . Bronchoscopy  02/15/2000  . Right vats.  "  . Right upper lobe wedge resection.  "  . Upper gastrointestinal endoscopy  11/25/1999    Esophagitis/ Normal proximal esophagus, stomach and duodenum  . Colonoscopy  01/17/2009    Dr. Deatra Ina : Internal hemorrhoids/Diverticula, scattered in the ascending colon/  Moderate diverticulosis ascending colon to sigmoid colon  . Esophagogastroduodenoscopy   04/23/2003    Dr. Deatra Ina: HIATAL HERNIA  . Colonoscopy  01/16/2001    Normal  . Cataract extraction, bilateral    . Esophagogastroduodenoscopy  03/20/2012    [id number] ring-LIKELY CAUSING MILD DYSPHAGIA/SMALL hiatal hernia/Multiple sessile polyps ranging between 3-64mm , path benign  . Cataract extraction    . Carpal tunnel release  Left wrist  . Transthoracic echocardiogram  06/24/2009    EF=>55%, mild assymetric LVH; LA mildly dilated; mild mitral annular calcif, borderline MVP, mild-mod MR; mild-mod TR, RV systolic pressure elevated, mild pulm HTN; AV mildly sclerotic; mild pulm valve regurg - ordered r/t bradycardia   . Endovenous ablation saphenous vein w/ laser  01/2011    Right GSV  . Cholecystectomy    . Intramedullary (im) nail intertrochanteric Left 03/25/2014    Procedure: INTRAMEDULLARY NAIL INTERTROCHANTRIC LEFT HIP;  Surgeon: Marianna Payment, MD;  Location: [place] [facility];  Service: Orthopedics;  Laterality: Left;    Family History  Problem Relation Age of Onset  . Diabetes Mother   .  Heart disease Mother   . Stroke Mother   . Cancer Sister     KIDNEY  . Emphysema Sister   . Heart disease Brother   . Heart disease Sister   . Emphysema Sister   . Cancer Sister     BREAST  . Heart disease Brother   . Colon cancer Sister   . Alzheimer's disease Father   . Heart disease Sister   . Tremor Sister   . Tremor Brother   . Alcoholism Child   . Cancer Brother   . Pancreatic cancer Brother   . Diabetes Son     Social history:  reports that she has never smoked. She has never used smokeless tobacco. She reports that she does not drink alcohol or use illicit drugs.    Allergies  Allergen Reactions  . Codeine Hives  . Morphine And Related Itching and Other (See Comments)    Makes pt hallucinate  . Actonel [Risedronate Sodium] Other (See Comments)    Abdominal pain  . Fosamax [Alendronate Sodium] Other (See Comments)    Abdominal pian  . Losartan Other (See Comments)    Hospitalized in 06/2013 with pancreatitis, losartan is associated with increased risk of pancreatitis ,  Hence discontinued  . Nitrofurantoin Other (See Comments)    Hospitalized with pancreatitis in 06/2013. Nitrofurantoin implicated as a possible cause    Medications:  Prior to Admission medications   Medication Sig Start Date End Date Taking? Authorizing Provider  ALPRAZolam (XANAX) 0.25 MG tablet Take 0.5 tablets (0.125 mg total) by mouth 2 (two) times daily as needed for anxiety. 10/10/14  Yes Kathrynn Ducking, MD  apixaban (ELIQUIS) 5 MG TABS tablet Take 1 tablet (5 mg total) by mouth 2 (two) times daily. 07/30/14  Yes Kathrynn Ducking, MD  atorvastatin (LIPITOR) 40 MG tablet Take 40 mg by mouth daily.   Yes Historical Provider, MD  busPIRone (BUSPAR) 5 MG tablet TAKE ONE TABLET BY MOUTH TWICE DAILY 11/25/14  Yes Fayrene Helper, MD  donepezil (ARICEPT) 5 MG tablet TAKE ONE TABLET BY MOUTH AT BEDTIME 12/02/14  Yes Fayrene Helper, MD  HYDROcodone-acetaminophen (NORCO/VICODIN) 5-325 MG per  tablet Take 1 tablet by mouth every 6 (six) hours as needed for moderate pain. 12/17/14  Yes Carole Civil, MD  levETIRAcetam (KEPPRA) 500 MG tablet Take 1 tablet (500 mg total) by mouth 2 (two) times daily. 12/19/14  Yes Jola Schmidt, MD  magnesium hydroxide (MILK OF MAGNESIA) 400 MG/5ML suspension Take by mouth daily as needed for mild constipation.   Yes Historical Provider, MD  metoprolol (LOPRESSOR) 50 MG tablet TAKE ONE-HALF TABLET BY MOUTH THREE TIMES DAILY 09/04/14  Yes Fayrene Helper, MD  mirtazapine (REMERON) 45 MG tablet Take 1 tablet (45 mg total) by mouth at bedtime. 11/20/14  Yes Kathrynn Ducking, MD  montelukast (SINGULAIR) 10 MG tablet Take 1 tablet (10 mg total) by mouth at bedtime. 11/28/14  Yes Fayrene Helper, MD  omeprazole (PRILOSEC) 20 MG capsule Take 1 capsule (20 mg total) by mouth 2 (two) times daily before a meal. Take 1 capsule by mouth twice daily at 6:30am and 9:00pm. 07/29/14  Yes Fayrene Helper, MD  gabapentin (NEURONTIN) 100 MG capsule Take 1 capsule (100 mg total) by mouth 3 (three) times daily. 12/24/14   Kathrynn Ducking, MD    ROS:  Out of a complete 14 system review of symptoms, the patient complains only of the following symptoms, and all other reviewed systems are negative.  Seizure Back, neck pain Gait disturbance  Blood pressure 152/92, pulse 56, height 5\' 7"  (1.702 m).  Physical Exam  General: The patient is alert and cooperative at the time of the examination.  Skin: No significant peripheral edema is noted.   Neurologic Exam  Mental status: The patient is alert and oriented x 3 at the time of the examination. The patient has apparent normal recent and remote memory, with an apparently normal attention span and concentration ability.   Cranial nerves: Facial symmetry is present. Speech is normal, no aphasia or dysarthria is noted. Extraocular movements are full. Visual fields are full.  Motor: The patient has good strength in the  right extremities. The patient has 4/5 strength on the left arm and left leg.  Sensory examination: Soft touch sensation is symmetric on the face, arms, and legs.  Coordination: The patient has good finger-nose-finger and heel-to-shin on the right, the patient has difficulty performing on the left. Prominent tremors are seen with finger-nose-finger on the right.  Gait and station: The patient is wheelchair-bound, could not be ambulated.  Reflexes: Deep tendon reflexes are symmetric.   CT head 12/19/14:  IMPRESSION: No acute intracranial abnormality. Old right MCA infarct.  * CT scan images were reviewed online. I agree with the written report.    Assessment/Plan:  1. Right middle cerebral artery distribution stroke, left hemiparesis  2. Left body focal seizure  3. Peripheral neuropathy  4. Gait disturbance  5. Chronic neck and low back pain  6. Essential tremor  The patient is having significant pain issues at this time. She also reports ongoing issues with an essential tremor which is impairing her ability to use her right upper extremity. The patient is essentially nonambulatory, she will stand for transfers. Previously, gabapentin resulted in some pain improvement, but it had a negative impact on her balance. At this point, we will retry the gabapentin to see if this is effective for pain. If not, we may try a fentanyl patch. She will follow-up in September 2016. The patient will remain on Keppra for now for the seizures, they will call when they need refills.   Jill Alexanders MD 12/24/2014 7:37 PM  [facility] [address] [place], [place] [address]  Phone [phone] Fax [phone]

## 2014-12-24 NOTE — Patient Instructions (Signed)

## 2014-12-24 NOTE — Telephone Encounter (Signed)
Rx picked up by grandson

## 2014-12-26 ENCOUNTER — Telehealth: Payer: Self-pay | Admitting: Neurology

## 2014-12-26 ENCOUNTER — Encounter (HOSPITAL_COMMUNITY): Payer: Self-pay | Admitting: Cardiology

## 2014-12-26 ENCOUNTER — Emergency Department (HOSPITAL_COMMUNITY)
Admission: EM | Admit: 2014-12-26 | Discharge: 2014-12-26 | Disposition: A | Discharge status: Home / Self Care | Payer: Medicare Other | Attending: Emergency Medicine | Admitting: Emergency Medicine

## 2014-12-26 DIAGNOSIS — R251 Tremor, unspecified: Secondary | ICD-10-CM | POA: Diagnosis present

## 2014-12-26 DIAGNOSIS — I10 Essential (primary) hypertension: Secondary | ICD-10-CM | POA: Diagnosis not present

## 2014-12-26 DIAGNOSIS — R600 Localized edema: Secondary | ICD-10-CM | POA: Insufficient documentation

## 2014-12-26 DIAGNOSIS — R569 Unspecified convulsions: Secondary | ICD-10-CM

## 2014-12-26 DIAGNOSIS — Z7902 Long term (current) use of antithrombotics/antiplatelets: Secondary | ICD-10-CM | POA: Diagnosis not present

## 2014-12-26 DIAGNOSIS — M1990 Unspecified osteoarthritis, unspecified site: Secondary | ICD-10-CM | POA: Diagnosis not present

## 2014-12-26 DIAGNOSIS — H268 Other specified cataract: Secondary | ICD-10-CM | POA: Insufficient documentation

## 2014-12-26 DIAGNOSIS — G40909 Epilepsy, unspecified, not intractable, without status epilepticus: Secondary | ICD-10-CM | POA: Insufficient documentation

## 2014-12-26 DIAGNOSIS — E785 Hyperlipidemia, unspecified: Secondary | ICD-10-CM | POA: Diagnosis not present

## 2014-12-26 DIAGNOSIS — I4891 Unspecified atrial fibrillation: Secondary | ICD-10-CM | POA: Diagnosis not present

## 2014-12-26 DIAGNOSIS — Z8673 Personal history of transient ischemic attack (TIA), and cerebral infarction without residual deficits: Secondary | ICD-10-CM | POA: Insufficient documentation

## 2014-12-26 DIAGNOSIS — Z9181 History of falling: Secondary | ICD-10-CM | POA: Diagnosis not present

## 2014-12-26 DIAGNOSIS — K228 Other specified diseases of esophagus: Secondary | ICD-10-CM | POA: Diagnosis not present

## 2014-12-26 DIAGNOSIS — Z87440 Personal history of urinary (tract) infections: Secondary | ICD-10-CM | POA: Insufficient documentation

## 2014-12-26 DIAGNOSIS — G4730 Sleep apnea, unspecified: Secondary | ICD-10-CM | POA: Insufficient documentation

## 2014-12-26 DIAGNOSIS — H9190 Unspecified hearing loss, unspecified ear: Secondary | ICD-10-CM | POA: Diagnosis not present

## 2014-12-26 DIAGNOSIS — F419 Anxiety disorder, unspecified: Secondary | ICD-10-CM | POA: Diagnosis not present

## 2014-12-26 DIAGNOSIS — K219 Gastro-esophageal reflux disease without esophagitis: Secondary | ICD-10-CM | POA: Insufficient documentation

## 2014-12-26 DIAGNOSIS — Z87310 Personal history of (healed) osteoporosis fracture: Secondary | ICD-10-CM | POA: Insufficient documentation

## 2014-12-26 DIAGNOSIS — E669 Obesity, unspecified: Secondary | ICD-10-CM | POA: Insufficient documentation

## 2014-12-26 LAB — CBC WITH DIFFERENTIAL/PLATELET
Basophils Absolute: 0 10*3/uL (ref 0.0–0.1)
Basophils Relative: 0 % (ref 0–1)
EOS ABS: 0.3 10*3/uL (ref 0.0–0.7)
Eosinophils Relative: 5 % (ref 0–5)
HEMATOCRIT: 41.1 % (ref 36.0–46.0)
Hemoglobin: 13.1 g/dL (ref 12.0–15.0)
LYMPHS PCT: 33 % (ref 12–46)
Lymphs Abs: 2 10*3/uL (ref 0.7–4.0)
MCH: 32.3 pg (ref 26.0–34.0)
MCHC: 31.9 g/dL (ref 30.0–36.0)
MCV: 101.2 fL — AB (ref 78.0–100.0)
MONO ABS: 0.5 10*3/uL (ref 0.1–1.0)
Monocytes Relative: 8 % (ref 3–12)
Neutro Abs: 3.1 10*3/uL (ref 1.7–7.7)
Neutrophils Relative %: 54 % (ref 43–77)
Platelets: 204 10*3/uL (ref 150–400)
RBC: 4.06 MIL/uL (ref 3.87–5.11)
RDW: 13 % (ref 11.5–15.5)
WBC: 5.9 10*3/uL (ref 4.0–10.5)

## 2014-12-26 LAB — BASIC METABOLIC PANEL
Anion gap: 9 (ref 5–15)
BUN: 13 mg/dL (ref 6–20)
CHLORIDE: 108 mmol/L (ref 101–111)
CO2: 27 mmol/L (ref 22–32)
CREATININE: 0.56 mg/dL (ref 0.44–1.00)
Calcium: 8.6 mg/dL — ABNORMAL LOW (ref 8.9–10.3)
GFR calc Af Amer: >60 mL/min (ref >60)
GFR calc non Af Amer: >60 mL/min (ref >60)
GLUCOSE: 88 mg/dL (ref 65–99)
Potassium: 3.7 mmol/L (ref 3.5–5.1)
Sodium: 144 mmol/L (ref 135–145)

## 2014-12-26 MED ORDER — LEVETIRACETAM 750 MG PO TABS: 750.0000 mg | ORAL_TABLET | Freq: Two times a day (BID) | ORAL | Status: DC

## 2014-12-26 MED ORDER — LEVETIRACETAM IN NACL 1000 MG/100ML IV SOLN
1000.0000 mg | Freq: Once | INTRAVENOUS | Status: AC
  Administered 2014-12-26: 1000 mg via INTRAVENOUS
  Filled 2014-12-26: qty 100

## 2014-12-26 MED ORDER — LORAZEPAM 2 MG/ML IJ SOLN
0.5000 mg | Freq: Once | INTRAMUSCULAR | Status: AC
  Administered 2014-12-26: 0.5 mg via INTRAVENOUS
  Filled 2014-12-26: qty 1

## 2014-12-26 NOTE — Discharge Instructions (Signed)

## 2014-12-26 NOTE — Clinical Social Work Note (Signed)
CSW met with Erin [name] and son, Erin [name].  Erin [name] stated "There is a lot going on in the family." He stated that he lives with Erin [name] in her home.  He stated that he has been paying a caregiver $1400.00 per month to provide care for Erin [name] but he cannot do it anymore. Erin [name] advised that he wanted Erin [name] placed. CSW advised that placement without a three day stay inpatient would mean that Erin [name] had to pay out of pocket.  Erin [name] advised that his brother Erin [name], pays Erin [name]'s bills out of Erin [name]'s money.  Erin [name] advised that he got in to a verbal altercation with the caregiver yesterday that resulted in his brother calling the police on him.  He advised that he would not pay "that bitch another dime of his money." (Per Erin [name] the conflict between [name] and Erin [name]'s caregiver, [name], was due to [employer] and pointing in Erin [name]'s face).  Erin [name] admitted to being verbally aggressive with Erin [name].  He also admitted that in Feb. Or Mar. He and Erin [name] go in to a verbal argument that lead to Erin [name] getting up and falling breaking her shoulder. According to Erin [name] said "I thought you could do it yourself bitch" when Erin [name] fell. CSW spoke with Erin [name]'s son Erin [name] via phone. He advised that his brother had an alcohol problem.  He stated that he and his family have wanted Erin [name] to come and live with him but she declines.  CSW discussed that due to the volatile nature of the conflict that it may be in Erin [name]'s best interest. He was agreeable.   Erin [name] advised that Erin [name] would have to come and pick Erin [name]'s bed up.  CSW contacted Erin son, Erin [name], who advised that he and his brother would come to the home and pick up Erin [name]'s belongings this evening. CSW advised Erin [name] that his [name] had agreed to pick up Erin [name]'s belongings this evening. Erin [name] was agreeable.  CSW spoke with Erin [name] and advised of the plan for continued care being at [facility].  Erin [name]  advised that she wanted to stay in her home but understood that Erin [name] could no longer provide care for her. She agreed to move in to Erin home this evening.    CSW made an APS report to [facility] regarding safety concerns.  Erin [name], [place]

## 2014-12-26 NOTE — ED Notes (Signed)
Social worker speaking with pt's son regarding possible placement options.

## 2014-12-26 NOTE — Telephone Encounter (Signed)
Noted.  Patient has since been to see Dr. Jannifer Franklin.  Currently in the ED

## 2014-12-26 NOTE — ED Notes (Signed)
Pt sleeping at this time.

## 2014-12-26 NOTE — Telephone Encounter (Signed)
The patient has presented to [facility] with episodic jerking of the left foot. The patient may have some dystonic posturing with the foot down and in. This occurs intermittently. This likely does represent a focal seizure, with some dystonic posturing. I would give 500 mg of IV Keppra now to see if this can be suppressed.

## 2014-12-26 NOTE — ED Notes (Signed)
Seen here 2 weeks ago for a seizure.  States she had tremors  Prior to the seizure, same as this morning.   Has not started the seizure medicine.

## 2014-12-26 NOTE — ED Notes (Signed)
Pt attempted to use bedpan.  Slight tremor to right arm but none to legs at present moment.

## 2014-12-26 NOTE — Progress Notes (Signed)
CM called to Ed by CSW in regards to possible home health needs. Pts son Erin [name] stated that he is not going to take care of his mother any more and that his brother Erin [name] was going to be taking care of the pt at discharge. CM spoke with Erin [name] per Erin [name] request about home health needs. Erin [name] stated that at this time he could not make a decision regarding home health services at this time. He did state that he was going to hire someone to stay with his mother during the working hours. Cm did tell Erin [name] that if he changes his mind in regards to home health services after discharge, to call pts PCP and they could arrange it. CSW addressed placement with pt and pts son but pt does not meet medicare requirements for SNF and they cannot afford to pay privately. Pt does currently have some private duty caregivers in the home.

## 2014-12-26 NOTE — ED Provider Notes (Signed)
CSN: [id number]     Arrival date & time 12/26/14  0904 History    This chart was scribed for Erin Chapel, MD by Hansel Feinstein, ED Scribe. This patient was seen in room APA04/APA04 and the patient's care was started at 9:10 AM.    Chief Complaint  Patient presents with  . Tremors   The history is provided by the patient and a relative. No language interpreter was used.    HPI Comments: Erin [name] is a 81 y.o. female with Hx of HLD, anxiety, arthritis, HTN, GERD, tremors, Afib, seizures, and stroke in 2015 who presents to the Emergency Department complaining of moderate, intermittent seizures throughout the week described as LLE tremor and shaking. Pt states associated weakness in the left leg. Pt states she lives at home and is given her medications by a caretaker, but has not taken her medications this morning. In September 2015, pt was diagnosed with an acute stroke via MRI. In March, she had a fall with secondary head injury and had a normal head CT. In May, she was seen by her neurologist and presented with gait disturbance and abnormal memory, and head CT was normal. According to the EMR, pt was seen in the ED on 12/19/14 for focal seizures, was given Benzos with resolution of the shaking and was prescribed Keppra-her 3rd head CT was also normal. Pt has seen her neurologist, Dr. Jannifer Franklin, two days ago, and according to son, he prescribed her Gabapentin, but family member is not sure if the Rx has been filled. Pt gets her medications filled at [facility]. Pt is on Eloquis for Afib.  Past Medical History  Diagnosis Date  . Anxiety   . Arthritis   . Hyperlipidemia   . Essential hypertension, benign   . Osteoporosis   . GERD (gastroesophageal reflux disease)   . Hearing loss   . Restless leg   . Tremor   . Leg edema   . Obese   . Gall bladder disease   . Cataract   . Carpal tunnel syndrome of left wrist   . Atrial fibrillation   . Sleep apnea   . Esophageal dilatation   . Frequent falls    . History of nuclear stress test 08/31/2012    Lexiscan cardiolite negative for ischemia  . Polyneuropathy in other diseases classified elsewhere 10/03/2013  . Cardioembolic stroke 32/20/2542  . UTI (lower urinary tract infection)   . History of stroke with current residual effects 11/20/2014  . Seizures   . Focal motor seizure disorder 12/24/2014    Left leg involvment   Past Surgical History  Procedure Laterality Date  . Appendectomy    . Cholecystectomy    . Abdominal hysterectomy    . Fracture surgery      Left arm x4  . Benign cyst removed from kidney and lung, bilateral mastectomy    . Bilateral great toenail removal    . Knee arthroscopy  2012    Right knee, Dr Aline Brochure  . Left forearm    . Breast surgery      Bilateral 2000  . Tonsillectomy    . R hand surgery    . Bronchoscopy  02/15/2000  . Right vats.  "  . Right upper lobe wedge resection.  "  . Upper gastrointestinal endoscopy  11/25/1999    Esophagitis/ Normal proximal esophagus, stomach and duodenum  . Colonoscopy  01/17/2009    Dr. Deatra Ina : Internal hemorrhoids/Diverticula, scattered in the ascending colon/  Moderate diverticulosis  ascending colon to sigmoid colon  . Esophagogastroduodenoscopy   04/23/2003    Dr. Deatra Ina: HIATAL HERNIA  . Colonoscopy  01/16/2001    Normal  . Cataract extraction, bilateral    . Esophagogastroduodenoscopy  03/20/2012    XKP:VVZSMOLM ring-LIKELY CAUSING MILD DYSPHAGIA/SMALL hiatal hernia/Multiple sessile polyps ranging between 3-16mm , path benign  . Cataract extraction    . Carpal tunnel release      Left wrist  . Transthoracic echocardiogram  06/24/2009    EF=>55%, mild assymetric LVH; LA mildly dilated; mild mitral annular calcif, borderline MVP, mild-mod MR; mild-mod TR, RV systolic pressure elevated, mild pulm HTN; AV mildly sclerotic; mild pulm valve regurg - ordered r/t bradycardia   . Endovenous ablation saphenous vein w/ laser  01/2011    Right GSV  . Cholecystectomy     . Intramedullary (im) nail intertrochanteric Left 03/25/2014    Procedure: INTRAMEDULLARY NAIL INTERTROCHANTRIC LEFT HIP;  Surgeon: Marianna Payment, MD;  Location: [facility];  Service: Orthopedics;  Laterality: Left;   Family History  Problem Relation Age of Onset  . Diabetes Mother   . Heart disease Mother   . Stroke Mother   . Cancer Sister     KIDNEY  . Emphysema Sister   . Heart disease Brother   . Heart disease Sister   . Emphysema Sister   . Cancer Sister     BREAST  . Heart disease Brother   . Colon cancer Sister   . Alzheimer's disease Father   . Heart disease Sister   . Tremor Sister   . Tremor Brother   . Alcoholism Child   . Cancer Brother   . Pancreatic cancer Brother   . Diabetes Son    History  Substance Use Topics  . Smoking status: Never Smoker   . Smokeless tobacco: Never Used  . Alcohol Use: No   OB History    No data available     Review of Systems  Neurological: Positive for tremors and weakness.  All other systems reviewed and are negative.  Allergies  Codeine; Morphine and related; Actonel; Fosamax; Losartan; and Nitrofurantoin  Home Medications   Prior to Admission medications   Medication Sig Start Date End Date Taking? Authorizing Provider  acetaminophen (TYLENOL) 500 MG tablet Take 1,000 mg by mouth every 6 (six) hours as needed for mild pain or moderate pain.   Yes Historical Provider, MD  ALPRAZolam (XANAX) 0.25 MG tablet Take 0.5 tablets (0.125 mg total) by mouth 2 (two) times daily as needed for anxiety. 10/10/14  Yes Kathrynn Ducking, MD  apixaban (ELIQUIS) 5 MG TABS tablet Take 1 tablet (5 mg total) by mouth 2 (two) times daily. 07/30/14  Yes Kathrynn Ducking, MD  busPIRone (BUSPAR) 5 MG tablet TAKE ONE TABLET BY MOUTH TWICE DAILY 11/25/14  Yes Fayrene Helper, MD  donepezil (ARICEPT) 5 MG tablet TAKE ONE TABLET BY MOUTH AT BEDTIME 12/02/14  Yes Fayrene Helper, MD  gabapentin (NEURONTIN) 100 MG capsule Take 1 capsule (100 mg  total) by mouth 3 (three) times daily. 12/24/14  Yes Kathrynn Ducking, MD  HYDROcodone-acetaminophen (NORCO/VICODIN) 5-325 MG per tablet Take 1 tablet by mouth every 6 (six) hours as needed for moderate pain. 12/17/14  Yes Carole Civil, MD  metoprolol (LOPRESSOR) 50 MG tablet TAKE ONE-HALF TABLET BY MOUTH THREE TIMES DAILY 09/04/14  Yes Fayrene Helper, MD  mirtazapine (REMERON) 45 MG tablet Take 1 tablet (45 mg total) by mouth at bedtime. 11/20/14  Yes Kathrynn Ducking, MD  montelukast (SINGULAIR) 10 MG tablet Take 1 tablet (10 mg total) by mouth at bedtime. 11/28/14  Yes Fayrene Helper, MD  omeprazole (PRILOSEC) 20 MG capsule Take 1 capsule (20 mg total) by mouth 2 (two) times daily before a meal. Take 1 capsule by mouth twice daily at 6:30am and 9:00pm. 07/29/14  Yes Fayrene Helper, MD  POTASSIUM PO Take 1 tablet by mouth daily.   Yes Historical Provider, MD  atorvastatin (LIPITOR) 20 MG tablet Take 1 tablet by mouth daily. 12/20/14   Historical Provider, MD  levETIRAcetam (KEPPRA) 750 MG tablet Take 1 tablet (750 mg total) by mouth 2 (two) times daily. 12/26/14   Erin Chapel, MD  magnesium hydroxide (MILK OF MAGNESIA) 400 MG/5ML suspension Take by mouth daily as needed for mild constipation.    Historical Provider, MD   BP 165/82 mmHg  Pulse 81  Temp(Src) 98.4 F (36.9 C) (Oral)  Resp 16  Ht 5\' 7"  (1.702 m)  Wt 123 lb (55.792 kg)  BMI 19.26 kg/m2  SpO2 96% Physical Exam  Constitutional: She appears well-developed and well-nourished. No distress.  HENT:  Head: Normocephalic and atraumatic.  Mouth/Throat: Oropharynx is clear and moist. No oropharyngeal exudate.  Eyes: Conjunctivae and EOM are normal. Pupils are equal, round, and reactive to light. Right eye exhibits no discharge. Left eye exhibits no discharge. No scleral icterus.  Neck: Normal range of motion. Neck supple. No JVD present. No thyromegaly present.  Cardiovascular: Normal rate, normal heart sounds and intact  distal pulses.  Exam reveals no gallop and no friction rub.   No murmur heard. Afib in the heart.   Pulmonary/Chest: Effort normal and breath sounds normal. No respiratory distress. She has no wheezes. She has no rales.  Abdominal: Soft. Bowel sounds are normal. She exhibits no distension and no mass. There is no tenderness.  Musculoskeletal: Normal range of motion. She exhibits no edema or tenderness.  Lymphadenopathy:    She has no cervical adenopathy.  Neurological: She is alert. Coordination normal.  Left facial droop. Left arm and leg weakness (chronic). Intermittent tremor of the left leg at the foot. Fatigues with distraction.   Skin: Skin is warm and dry. No rash noted. No erythema.  Psychiatric: She has a normal mood and affect. Her behavior is normal.  Nursing note and vitals reviewed.   ED Course  Procedures (including critical care time) DIAGNOSTIC STUDIES: Oxygen Saturation is 96% on RA, adequate by my interpretation.    COORDINATION OF CARE: 9:17 AM Discussed treatment plan with pt at bedside and pt agreed to plan.   Labs Review Labs Reviewed  CBC WITH DIFFERENTIAL/PLATELET - Abnormal; Notable for the following:    MCV 101.2 (*)    All other components within normal limits  BASIC METABOLIC PANEL - Abnormal; Notable for the following:    Calcium 8.6 (*)    All other components within normal limits    Imaging Review No results found.   EKG Interpretation   Date/Time:  Thursday December 26 2014 09:16:06 EDT Ventricular Rate:  97 PR Interval:    QRS Duration: 109 QT Interval:  389 QTC Calculation: 494 R Axis:   87 Text Interpretation:  Atrial fibrillation Borderline right axis deviation  Nonspecific repol abnormality, anterior leads Borderline prolonged QT  interval Since last tracing there is now tremor diffusely -correlates with  her exam. Confirmed by Jailah Willis  MD, Tyronda Vizcarrondo ([id number]) on 12/26/2014 9:49:08 AM     MDM  Final diagnoses:  Focal seizures    Discussed the plan of care with Dr. Jannifer Franklin at 10:00 AM, he agrees that the patient can be given an extra dose of Keppra to suppress the focal seizures, he agrees there is no indication for admission or EEG monitoring, she can follow-up in the office. Half a milligram of Ativan and 1 g of Keppra have been ordered.    After Keppra and 0.5mg  of Ativan, there has been no more seizure activity - pt is stable - son requested placement - SW has consulted and attempted without success - pt will move tonight to go to her other son's house for assistance - all parties involved are in agreement.  I personally performed the services described in this documentation, which was scribed in my presence. The recorded information has been reviewed and is accurate.    Meds given in ED:  Medications  LORazepam (ATIVAN) injection 0.5 mg (0.5 mg Intravenous Given 12/26/14 0940)  levETIRAcetam (KEPPRA) IVPB 1000 mg/100 mL premix (1,000 mg Intravenous Given 12/26/14 1004)    New Prescriptions   LEVETIRACETAM (KEPPRA) 750 MG TABLET    Take 1 tablet (750 mg total) by mouth 2 (two) times daily.        Erin Chapel, MD 12/26/14 1329

## 2014-12-27 ENCOUNTER — Encounter: Payer: Self-pay | Admitting: *Deleted

## 2014-12-27 NOTE — Patient Outreach (Signed)
[facility]) Care Management  12/27/2014  Erin [name] [facility] 08/27/1929 [id number]   Noted Erin [name]'s visit to ED and plans for her to return to the home of her son Erin [name]. We are very familiar with Erin [name] as she has been followed by [facility] for the better part of this year. The family situation is unfortunate but it has been my experience that Erin [name]'s needs are met and she is well attended in the home of her son Erin [name].   I will contact Erin [name] at her son Erin [name]'s home on Monday for follow up.    [facility]  [phone]

## 2014-12-30 ENCOUNTER — Other Ambulatory Visit: Payer: Self-pay | Admitting: *Deleted

## 2014-12-30 NOTE — Patient Outreach (Signed)
[facility]) Care Management  12/30/2014  Banita Lehn [facility] [birth year] [id number]  I contacted Mrs. Juliana's son and Tavia Stave this morning to follow up on Mrs. Ozark ED visit last week. Post hospitalization and SNF stay early this year, Mrs. Low returned home but had to move to her son Dale's home because her home environment was not safe. She recently returned to her home where her other son [name] lives to trial a return to home. Unfortunately this did not work out. Her private duty caregivers quit, citing environmental stress and safety concerns. Mrs. Banta presented to the ED on Thursday of last week and discharged to the care and home of her son [name].   [name] and I spoke today and he tells me that he has arranged for private duty care 24/7 for Mrs. Fratus in his home. He reports that Mrs. Klinedinst is safe and well at home and is receiving medications and care as prescribed. He has already made arrangements for himself or other family members to attend provider appointments with Mrs. Sciascia. Mrs. Nelon, according to [name], has decided to sell her home and move in permanently with [name] and his family. Mrs. Cantu is having all utilities discontinued on Thursday of this week and will put the home on the market.   I will see Mrs. Babler for a home visit either late this week or early next week.    [facility]  [phone]

## 2014-12-31 ENCOUNTER — Other Ambulatory Visit: Payer: Self-pay | Admitting: *Deleted

## 2014-12-31 ENCOUNTER — Telehealth: Payer: Self-pay | Admitting: Orthopedic Surgery

## 2014-12-31 MED ORDER — HYDROCODONE-ACETAMINOPHEN 5-325 MG PO TABS: 1.0000 | ORAL_TABLET | Freq: Four times a day (QID) | ORAL | Status: DC | PRN

## 2014-12-31 NOTE — Telephone Encounter (Signed)
Patient is requesting a refill on medication HYDROcodone-acetaminophen (NORCO/VICODIN) 5-325 MG per tablet please advise?

## 2015-01-01 NOTE — Telephone Encounter (Signed)
Patients grandson picked up Rx

## 2015-01-07 ENCOUNTER — Encounter: Payer: Self-pay | Admitting: Neurology

## 2015-01-07 ENCOUNTER — Other Ambulatory Visit: Payer: Self-pay | Admitting: *Deleted

## 2015-01-07 ENCOUNTER — Ambulatory Visit (INDEPENDENT_AMBULATORY_CARE_PROVIDER_SITE_OTHER): Payer: Medicare Other | Admitting: Neurology

## 2015-01-07 VITALS — BP 148/76 | HR 81 | Ht 66.0 in

## 2015-01-07 DIAGNOSIS — G63 Polyneuropathy in diseases classified elsewhere: Secondary | ICD-10-CM | POA: Diagnosis not present

## 2015-01-07 DIAGNOSIS — G250 Essential tremor: Secondary | ICD-10-CM

## 2015-01-07 DIAGNOSIS — I639 Cerebral infarction, unspecified: Secondary | ICD-10-CM

## 2015-01-07 DIAGNOSIS — G40109 Localization-related (focal) (partial) symptomatic epilepsy and epileptic syndromes with simple partial seizures, not intractable, without status epilepticus: Secondary | ICD-10-CM | POA: Diagnosis not present

## 2015-01-07 DIAGNOSIS — G252 Other specified forms of tremor: Secondary | ICD-10-CM

## 2015-01-07 DIAGNOSIS — R251 Tremor, unspecified: Secondary | ICD-10-CM | POA: Diagnosis not present

## 2015-01-07 DIAGNOSIS — R413 Other amnesia: Secondary | ICD-10-CM

## 2015-01-07 MED ORDER — GABAPENTIN 100 MG PO CAPS: ORAL_CAPSULE | ORAL | Status: DC

## 2015-01-07 MED ORDER — ALPRAZOLAM 0.5 MG PO TABS
0.5000 mg | ORAL_TABLET | Freq: Every evening | ORAL | Status: DC | PRN
Start: 2015-01-07 — End: 2015-04-04

## 2015-01-07 NOTE — Progress Notes (Signed)
Reason for visit: Seizures  Erin [name] is an 81 y.o. female  History of present illness:  Erin [name] is an 81 year old right-handed white female with a history of a peripheral neuropathy. The patient also has an essential tremor affecting the upper extremities. 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 The patient began having left-sided focal seizures that began in the left leg, and the spread up the left side of the body. The patient went to the hospital again on 12/26/2014 with a prolonged left-sided seizure. The patient was given IV Keppra, and the Keppra dosing was increased to 750 mg twice daily. The patient has been placed on gabapentin for the peripheral neuropathy pain, taking 100 mg 3 times daily. The patient is still having discomfort at night. She takes alprazolam 0.25 mg at night for sleep as some benefit. The patient will usually wake up around 3 AM with severe leg pain. The patient also has lower back pain. She is having difficulty feeding herself because the right-sided tremor. She returns to this office for an evaluation.  Past Medical History  Diagnosis Date  . Anxiety   . Arthritis   . Hyperlipidemia   . Essential hypertension, benign   . Osteoporosis   . GERD (gastroesophageal reflux disease)   . Hearing loss   . Restless leg   . Tremor   . Leg edema   . Obese   . Gall bladder disease   . Cataract   . Carpal tunnel syndrome of left wrist   . Atrial fibrillation   . Sleep apnea   . Esophageal dilatation   . Frequent falls   . History of nuclear stress test 08/31/2012    Lexiscan cardiolite negative for ischemia  . Polyneuropathy in other diseases classified elsewhere 10/03/2013  . Cardioembolic stroke 40/98/1191  . UTI (lower urinary tract infection)   . History of stroke with current residual effects 11/20/2014  . Seizures   . Focal motor seizure disorder 12/24/2014    Left leg involvment    Past  Surgical History  Procedure Laterality Date  . Appendectomy    . Cholecystectomy    . Abdominal hysterectomy    . Fracture surgery      Left arm x4  . Benign cyst removed from kidney and lung, bilateral mastectomy    . Bilateral great toenail removal    . Knee arthroscopy  2012    Right knee, Dr Aline Brochure  . Left forearm    . Breast surgery      Bilateral 2000  . Tonsillectomy    . R hand surgery    . Bronchoscopy  02/15/2000  . Right vats.  "  . Right upper lobe wedge resection.  "  . Upper gastrointestinal endoscopy  11/25/1999    Esophagitis/ Normal proximal esophagus, stomach and duodenum  . Colonoscopy  01/17/2009    Dr. Deatra Ina : Internal hemorrhoids/Diverticula, scattered in the ascending colon/  Moderate diverticulosis ascending colon to sigmoid colon  . Esophagogastroduodenoscopy   04/23/2003    Dr. Deatra Ina: HIATAL HERNIA  . Colonoscopy  01/16/2001    Normal  . Cataract extraction, bilateral    . Esophagogastroduodenoscopy  03/20/2012    YNW:GNFAOZHY ring-LIKELY CAUSING MILD DYSPHAGIA/SMALL hiatal hernia/Multiple sessile polyps ranging between 3-4mm , path benign  . Cataract extraction    . Carpal tunnel release      Left wrist  . Transthoracic echocardiogram  06/24/2009    EF=>55%, mild  assymetric LVH; LA mildly dilated; mild mitral annular calcif, borderline MVP, mild-mod MR; mild-mod TR, RV systolic pressure elevated, mild pulm HTN; AV mildly sclerotic; mild pulm valve regurg - ordered r/t bradycardia   . Endovenous ablation saphenous vein w/ laser  01/2011    Right GSV  . Cholecystectomy    . Intramedullary (im) nail intertrochanteric Left 03/25/2014    Procedure: INTRAMEDULLARY NAIL INTERTROCHANTRIC LEFT HIP;  Surgeon: Marianna Payment, MD;  Location: [facility];  Service: Orthopedics;  Laterality: Left;    Family History  Problem Relation Age of Onset  . Diabetes Mother   . Heart disease Mother   . Stroke Mother   . Cancer Sister     KIDNEY  . Emphysema Sister    . Heart disease Brother   . Heart disease Sister   . Emphysema Sister   . Cancer Sister     BREAST  . Heart disease Brother   . Colon cancer Sister   . Alzheimer's disease Father   . Heart disease Sister   . Tremor Sister   . Tremor Brother   . Alcoholism Child   . Cancer Brother   . Pancreatic cancer Brother   . Diabetes Son     Social history:  reports that she has never smoked. She has never used smokeless tobacco. She reports that she does not drink alcohol or use illicit drugs.    Allergies  Allergen Reactions  . Codeine Hives  . Morphine And Related Itching and Other (See Comments)    Makes pt hallucinate  . Actonel [Risedronate Sodium] Other (See Comments)    Abdominal pain  . Fosamax [Alendronate Sodium] Other (See Comments)    Abdominal pian  . Losartan Other (See Comments)    Hospitalized in 06/2013 with pancreatitis, losartan is associated with increased risk of pancreatitis ,  Hence discontinued  . Nitrofurantoin Other (See Comments)    Hospitalized with pancreatitis in 06/2013. Nitrofurantoin implicated as a possible cause    Medications:  Prior to Admission medications   Medication Sig Start Date End Date Taking? Authorizing Provider  acetaminophen (TYLENOL) 500 MG tablet Take 1,000 mg by mouth every 6 (six) hours as needed for mild pain or moderate pain.   Yes Historical Provider, MD  ALPRAZolam (XANAX) 0.25 MG tablet Take 0.5 tablets (0.125 mg total) by mouth 2 (two) times daily as needed for anxiety. 10/10/14  Yes Kathrynn Ducking, MD  apixaban (ELIQUIS) 5 MG TABS tablet Take 1 tablet (5 mg total) by mouth 2 (two) times daily. 07/30/14  Yes Kathrynn Ducking, MD  atorvastatin (LIPITOR) 20 MG tablet Take 1 tablet by mouth daily. 12/20/14  Yes Historical Provider, MD  busPIRone (BUSPAR) 5 MG tablet TAKE ONE TABLET BY MOUTH TWICE DAILY 11/25/14  Yes Fayrene Helper, MD  donepezil (ARICEPT) 5 MG tablet TAKE ONE TABLET BY MOUTH AT BEDTIME 12/02/14  Yes Fayrene Helper, MD  gabapentin (NEURONTIN) 100 MG capsule Take 1 capsule (100 mg total) by mouth 3 (three) times daily. 12/24/14  Yes Kathrynn Ducking, MD  HYDROcodone-acetaminophen (NORCO/VICODIN) 5-325 MG per tablet Take 1 tablet by mouth every 6 (six) hours as needed for moderate pain. 12/31/14  Yes Carole Civil, MD  levETIRAcetam (KEPPRA) 750 MG tablet Take 1 tablet (750 mg total) by mouth 2 (two) times daily. 12/26/14  Yes Noemi Chapel, MD  magnesium hydroxide (MILK OF MAGNESIA) 400 MG/5ML suspension Take by mouth daily as needed for mild constipation.   Yes  Historical Provider, MD  metoprolol (LOPRESSOR) 50 MG tablet TAKE ONE-HALF TABLET BY MOUTH THREE TIMES DAILY 09/04/14  Yes Fayrene Helper, MD  mirtazapine (REMERON) 45 MG tablet Take 1 tablet (45 mg total) by mouth at bedtime. 11/20/14  Yes Kathrynn Ducking, MD  montelukast (SINGULAIR) 10 MG tablet Take 1 tablet (10 mg total) by mouth at bedtime. 11/28/14  Yes Fayrene Helper, MD  omeprazole (PRILOSEC) 20 MG capsule Take 1 capsule (20 mg total) by mouth 2 (two) times daily before a meal. Take 1 capsule by mouth twice daily at 6:30am and 9:00pm. 07/29/14  Yes Fayrene Helper, MD  POTASSIUM PO Take 1 tablet by mouth daily.   Yes Historical Provider, MD    ROS:  Out of a complete 14 system review of symptoms, the patient complains only of the following symptoms, and all other reviewed systems are negative.  Difficulty swallowing, drooling Cough Tremor Foot pain  Blood pressure 148/76, pulse 81, height 5\' 6"  (1.676 m).  Physical Exam  General: The patient is alert and cooperative at the time of the examination.  Skin: No significant peripheral edema is noted.   Neurologic Exam  Mental status: The patient is alert and oriented x 2 at the time of the examination (not oriented to date). The patient has apparent normal recent and remote memory, with an apparently normal attention span and concentration ability. Mini-Mental Status  Examination done today shows a total score 22/30. The patient is able to name 6 animals in 30 seconds.   Cranial nerves: Facial symmetry is present. Speech is normal, no aphasia or dysarthria is noted. Extraocular movements are full. Visual fields are full.  Motor: The patient has good strength in the right extremities. The patient is unable to elevate the left arm overhead, has good distal strength of the left arm. Patient has 4/5 strength of the left leg.  Sensory examination: Soft touch sensation is symmetric on the face, arms, and legs.  Coordination: The patient has good finger-nose-finger and heel-to-shin on the right, has difficulty performing on the left. Prominent tremors are seen with the right greater than left upper extremities.  Gait and station: The patient is wheelchair-bound, she was not ambulated.  Reflexes: Deep tendon reflexes are symmetric.   Assessment/Plan:  1. Cerebrovascular disease, left hemiparesis  2. Left sided focal seizures  3. Essential tremor  4. Gait disorder  5. Peripheral neuropathy  The patient is having ongoing pain issues with the peripheral neuropathy and chronic low back pain. The gabapentin will be increased taking 100 mg twice during the day, 300 mg in the evening. The patient will be increased on alprazolam for sleep taking 0.5 mg at night. She will follow-up in September 2016. If need be in the future, fentanyl be added for the neuropathy pain.  Jill Alexanders MD 01/07/2015 3:31 PM  [facility] [address] [place], [place] [address]  Phone [phone] Fax [phone]

## 2015-01-07 NOTE — Patient Instructions (Signed)

## 2015-01-08 NOTE — Patient Outreach (Signed)
[facility]) Care Management   01/08/2015  Erin [name] Apr 26, 1930 [id number]  Erin [name] is an 81 y.o. female  Subjective: Erin [name] is an 81 year old lady who has a history of HTN, afib, and cardioembolic stroke in September of 2015. [facility] Care Management has been following Erin [name] in the community off and on for > 2 years for management of HTN. Unfortunately, she suffered another significant stroke in January and fell, sustaining a left femur fracture. She was subsequently hospitalized and spent some time at [facility] for rehabilitation after her hospitalization. Erin [name] discharged to her home where her adult son Erin [name] lived with her. Erin [name] did not do well in this setting, unfortunately suffering several falls and a shoulder fracture. Erin [name] and other family members had to go to great lengths and expense to make arrangements for in home care services as her son Erin [name] declined to provide hands on care for her. [facility] Care Management nursing services and social work services made an APS referral. Because Erin [name] insisted that she was satisfied with her situation and was cognitively intact at the time, no changes were made in Erin [name]'s environment or care at the direction of APS. [facility] Care Management provided emergency care cost coverage for a very brief period while arrangements were made for Erin [name] to move to the home of her son who is POA, [employer]. Erin [name] returned to her home briefly but her son Erin [name] was unable to continue to provide the care he promised to her. Erin [name] has moved back in with her son Erin [name] and put the house on the market. She plans to live with Erin [name] indefinitely.  Erin [name] is still very deconditioned, has impaired gait and is very high risk for fall.  Objective:  BP 148/76 mmHg  Pulse 81  Review of Systems  Constitutional: Negative.   HENT: Negative.   Eyes: Negative.   Respiratory:  Positive for cough.        Mild intermittent non productive cough  Cardiovascular: Negative.   Gastrointestinal: Negative.   Genitourinary: Negative.   Musculoskeletal: Positive for myalgias. Negative for falls.  Skin: Negative.   Neurological: Positive for sensory change and focal weakness.       Worsening neuropathy pain in both feet  Weakness left side - arm/hand/leg  Endo/Heme/Allergies: Negative.   Psychiatric/Behavioral: Negative.     Physical Exam  Constitutional: She is oriented to person, place, and time. Vital signs are normal. She appears well-nourished. She is active.  Cardiovascular: Normal rate and regular rhythm.   Respiratory: Effort normal and breath sounds normal.  GI: Soft. Bowel sounds are normal.  Musculoskeletal:       Left shoulder: She exhibits decreased range of motion and decreased strength.       Left wrist: She exhibits decreased range of motion.       Left hip: She exhibits decreased range of motion and decreased strength.       Left ankle: She exhibits decreased range of motion.  Neurological: She is alert and oriented to person, place, and time. She exhibits abnormal muscle tone. Gait abnormal.  Skin: Skin is warm, dry and intact.  Psychiatric: She has a normal mood and affect. Her speech is normal and behavior is normal. Judgment and thought content normal. Cognition and memory are normal.  Patient says "I have a pity party some days but I know I'm blessed to be alive and I am thankful"  Current Medications:   Current Outpatient Prescriptions  Medication Sig Dispense Refill  . acetaminophen (TYLENOL) 500 MG tablet Take 1,000 mg by mouth every 6 (six) hours as needed for mild pain or moderate pain.    [name] ALPRAZolam (XANAX) 0.5 MG tablet Take 1 tablet (0.5 mg total) by mouth at bedtime as needed for anxiety. 30 tablet 5  . apixaban (ELIQUIS) 5 MG TABS tablet Take 1 tablet (5 mg total) by mouth 2 (two) times daily. 60 tablet 6  . atorvastatin  (LIPITOR) 20 MG tablet Take 1 tablet by mouth daily.    . busPIRone (BUSPAR) 5 MG tablet TAKE ONE TABLET BY MOUTH TWICE DAILY 60 tablet 0  . donepezil (ARICEPT) 5 MG tablet TAKE ONE TABLET BY MOUTH AT BEDTIME 30 tablet 3  . gabapentin (NEURONTIN) 100 MG capsule One capsule in the morning and midday, 3 capsules at bedtime 150 capsule 3  . HYDROcodone-acetaminophen (NORCO/VICODIN) 5-325 MG per tablet Take 1 tablet by mouth every 6 (six) hours as needed for moderate pain. 30 tablet 0  . levETIRAcetam (KEPPRA) 750 MG tablet Take 1 tablet (750 mg total) by mouth 2 (two) times daily. 60 tablet 1  . magnesium hydroxide (MILK OF MAGNESIA) 400 MG/5ML suspension Take by mouth daily as needed for mild constipation.    . metoprolol (LOPRESSOR) 50 MG tablet TAKE ONE-HALF TABLET BY MOUTH THREE TIMES DAILY 45 tablet 4  . mirtazapine (REMERON) 45 MG tablet Take 1 tablet (45 mg total) by mouth at bedtime. 30 tablet 5  . montelukast (SINGULAIR) 10 MG tablet Take 1 tablet (10 mg total) by mouth at bedtime. 30 tablet 3  . omeprazole (PRILOSEC) 20 MG capsule Take 1 capsule (20 mg total) by mouth 2 (two) times daily before a meal. Take 1 capsule by mouth twice daily at 6:30am and 9:00pm. 60 capsule 3  . POTASSIUM PO Take 1 tablet by mouth daily.     No current facility-administered medications for this visit.    Functional Status:   In your present state of health, do you have any difficulty performing the following activities: 12/05/2014 10/03/2014  Hearing? N N  Vision? N N  Difficulty concentrating or making decisions? Erin [name]  Walking or climbing stairs? Y Y  Dressing or bathing? Y Y  Doing errands, shopping? Erin [name]  Preparing Food and eating ? Y Y  Using the Toilet? Y Y  In the past six months, have you accidently leaked urine? Y Y  Do you have problems with loss of bowel control? N N  Managing your Medications? Y Y  Managing your Finances? Y N  Housekeeping or managing your Housekeeping? Erin [name]     Fall/Depression Screening:    PHQ 2/9 Scores 01/07/2015 12/05/2014 08/12/2014 07/17/2013 02/13/2013  PHQ - 2 Score 1 0 0 2 0  PHQ- 9 Score - - - 3 -    Assessment:   Very frail 81 year old lady who recently had a stroke. Caregiving has been an issue and Mrs. Pense is now living indefinitely with her son Erin [name]. She has put her house on the market. Mrs. Lupe appeared as well, strong, and happy today as I have seen her since before she had her stroke in September. Her grandson has taken the role of "trainer" and helps her ambulate in the house 4 times daily. Mrs. Vanroekel can even walk a few steps at a time with assist x 2. My hope and recommendation to Mrs. Caporale is that she remain  in the care of her son Erin [name] and his family.   Mrs. Manukyan saw Dr. Floyde Parkins, Neurologist today and he increased her Gabapentin dose to help with neuropathic pain in her feet. She is to start her new dose this evening.    Plan:  Very close surveillance by [facility] for oversight and assessments.  Private duty caregivers arranged by family.  [facility] CM Care Plan Problem One        Patient Outreach from 01/07/2015 in [place] Problem One  Ongoing level of care concerns related to stroke/falls/fracture and family relationship concerns   Care Plan for Problem One  Active   [facility] Long Term Goal (31-90 days)  in the next 60 days patient and family members/caregivers will collaboarate to provide realistic plan for 24/7 care for patient as evidenced by conversations with patient and caregivers    [facility] Long Term Goal Start Date  12/05/14   [facility] Long Term Goal Met Date  01/07/15   Interventions for Problem One Long Term Goal  utiizing teachback method, discussed with patient and both sons the imortance of havin agreement and formalized plan for long term care for patient   [facility] CM Short Term Goal #1 (0-30 days)  in the next 30 days patient and sons will verbalize agreed upon long term plan  for 24/7 care for patient   [facility] CM Short Term Goal #1 Start Date  12/05/14   [facility] CM Short Term Goal #1 Met Date  01/07/15   Interventions for Short Term Goal #1  utilizing teachback method, reviewed with patient and sons available options for long term care including continued in home care services (privately paid) and SNF care (if/when appropriate)   [facility] CM Short Term Goal #2 (0-30 days)  in the next 30 days, established plan for having approriate DME in patient's home and son's home   [facility] CM Short Term Goal #2 Start Date  12/05/14   [facility] CM Short Term Goal #2 Met Date  01/07/15   [facility] CM Short Term Goal #3 (0-30 days)  in the next 14 days patient will resume HHPT as ordered by [name] as evidenced by patient and [facility] report of start of service   [facility] CM Short Term Goal #3 Start Date  12/05/14   Interventions for Short Tern Goal #3  utilizing teachback method, reviewed with patient/sons plan to resume [place] Problem Two        Patient Outreach from 01/07/2015 in [place] Problem Two  Deconditioning and ADL/IADL needs related to stroke and multiple fractures over last months   Care Plan for Problem Two  Active   Interventions for Problem Two Long Term Goal   utilizing teachback method, reviewed with patient necessity of established and agreed upon long term plan of care that will be safe and will meet patient's daily care needs consistently   [facility] Long Term Goal (31-90) days  over the next 31 days patient will verbalize realistic plan for safe, long term care plan   [facility] Long Term Goal Start Date  01/07/15   [facility] CM Short Term Goal #1 (0-30 days)  over the next 30 days patient or son will verbalize successful hiring of in home care provider for hours each week when son/dtr-in-law must be out of the home for work   [facility] CM Short Term Goal #1 Start Date  01/07/15  Interventions for Short Term Goal #2   discussed with son Erin [name] and patient community resources  available for in home care providers if the new contact they have does not work out if current caregiver cannot maintain current hours   [facility] CM Short Term Goal #2 (0-30 days)  over the next 30 days patient will recieve needed legal and real estate help to sell her home and receive appropriate secure payment   [facility] CM Short Term Goal #2 Start Date  01/07/15   Interventions for Short Term Goal #2  reviewed with patient and son legal services available to patient if she is unable to afford legal services (Legal Aid)      [place] Management  [phone]

## 2015-01-14 ENCOUNTER — Ambulatory Visit (HOSPITAL_COMMUNITY): Payer: Medicare Other | Attending: Family Medicine | Admitting: Physical Therapy

## 2015-01-14 DIAGNOSIS — I69359 Hemiplegia and hemiparesis following cerebral infarction affecting unspecified side: Secondary | ICD-10-CM | POA: Insufficient documentation

## 2015-01-14 DIAGNOSIS — R2681 Unsteadiness on feet: Secondary | ICD-10-CM | POA: Diagnosis not present

## 2015-01-14 DIAGNOSIS — I6930 Unspecified sequelae of cerebral infarction: Secondary | ICD-10-CM | POA: Diagnosis not present

## 2015-01-14 DIAGNOSIS — Z7409 Other reduced mobility: Secondary | ICD-10-CM | POA: Diagnosis not present

## 2015-01-14 DIAGNOSIS — R293 Abnormal posture: Secondary | ICD-10-CM | POA: Diagnosis not present

## 2015-01-14 DIAGNOSIS — Z9181 History of falling: Secondary | ICD-10-CM

## 2015-01-14 DIAGNOSIS — R269 Unspecified abnormalities of gait and mobility: Secondary | ICD-10-CM | POA: Insufficient documentation

## 2015-01-14 DIAGNOSIS — R296 Repeated falls: Secondary | ICD-10-CM | POA: Diagnosis not present

## 2015-01-14 DIAGNOSIS — R531 Weakness: Secondary | ICD-10-CM

## 2015-01-14 NOTE — Patient Instructions (Signed)
Unsupported Anterior / Posterior Weight Shift: Upper Trunk Leading   Sit, feet flat on floor. Leading with head and shoulders, bend down reaching arms toward ground. Return. Lean back, looking up toward ceiling.  Repeat _10__ times per session. Do _2__ sessions per day.   Copyright  VHI. All rights reserved.   Unsupported Diagonal Weight Shift   Sit, feet flat on floor, hands clasped in front. Lean forward, nose over right knee. Return. Lean back bringing weight over left buttock.  Repeat __10__ times per session. Do __2__ sessions per day.    UNSUPPORTED SITTING   At least 3-4 times per day, sit with no back or side support and good posture, with head upright and looking forward. Hold this position as long as you can without drifting backwards or to the side, and before any pain starts.  Repeat in opposite diagonal. Repeat on compliant surface: ________.  Copyright  VHI. All rights reserved.

## 2015-01-14 NOTE — Therapy (Signed)
[place], [place], [address] Phone: [phone]   Fax:  [phone]  Physical Therapy Evaluation  Patient Details  Name: Erin [name] MRN: [id number] Date of Birth: May 10, 1930 Referring Provider:  Fayrene Helper, MD  Encounter Date: 01/14/2015      PT End of Session - 01/14/15 1632    Visit Number 1   Number of Visits 20   Date for PT Re-Evaluation 02/11/15   Authorization Type Medicare/[employer]    Authorization Time Period 01/14/15 to 03/17/15   Authorization - Visit Number 1   Authorization - Number of Visits 10   PT Start Time 1962   PT Stop Time 1429   PT Time Calculation (min) 42 min   Equipment Utilized During Treatment Gait belt   Activity Tolerance Patient tolerated treatment well   Behavior During Therapy [facility] for tasks assessed/performed;Anxious  high fear of falling       Past Medical History  Diagnosis Date  . Anxiety   . Arthritis   . Hyperlipidemia   . Essential hypertension, benign   . Osteoporosis   . GERD (gastroesophageal reflux disease)   . Hearing loss   . Restless leg   . Tremor   . Leg edema   . Obese   . Gall bladder disease   . Cataract   . Carpal tunnel syndrome of left wrist   . Atrial fibrillation   . Sleep apnea   . Esophageal dilatation   . Frequent falls   . History of nuclear stress test 08/31/2012    Lexiscan cardiolite negative for ischemia  . Polyneuropathy in other diseases classified elsewhere 10/03/2013  . Cardioembolic stroke 22/97/9892  . UTI (lower urinary tract infection)   . History of stroke with current residual effects 11/20/2014  . Seizures   . Focal motor seizure disorder 12/24/2014    Left leg involvment    Past Surgical History  Procedure Laterality Date  . Appendectomy    . Cholecystectomy    . Abdominal hysterectomy    . Fracture surgery      Left arm x4  . Benign cyst removed from kidney and lung, bilateral mastectomy     . Bilateral great toenail removal    . Knee arthroscopy  2012    Right knee, Dr Aline Brochure  . Left forearm    . Breast surgery      Bilateral 2000  . Tonsillectomy    . R hand surgery    . Bronchoscopy  02/15/2000  . Right vats.  "  . Right upper lobe wedge resection.  "  . Upper gastrointestinal endoscopy  11/25/1999    Esophagitis/ Normal proximal esophagus, stomach and duodenum  . Colonoscopy  01/17/2009    Dr. Deatra Ina : Internal hemorrhoids/Diverticula, scattered in the ascending colon/  Moderate diverticulosis ascending colon to sigmoid colon  . Esophagogastroduodenoscopy   04/23/2003    Dr. Deatra Ina: HIATAL HERNIA  . Colonoscopy  01/16/2001    Normal  . Cataract extraction, bilateral    . Esophagogastroduodenoscopy  03/20/2012    [facility]:ERDEYCXK ring-LIKELY CAUSING MILD DYSPHAGIA/SMALL hiatal hernia/Multiple sessile polyps ranging between 3-35mm , path benign  . Cataract extraction    . Carpal tunnel release      Left wrist  . Transthoracic echocardiogram  06/24/2009    EF=>55%, mild assymetric LVH; LA mildly dilated; mild mitral annular calcif, borderline MVP, mild-mod MR; mild-mod TR, RV systolic pressure elevated, mild pulm HTN; AV mildly  sclerotic; mild pulm valve regurg - ordered r/t bradycardia   . Endovenous ablation saphenous vein w/ laser  01/2011    Right GSV  . Cholecystectomy    . Intramedullary (im) nail intertrochanteric Left 03/25/2014    Procedure: INTRAMEDULLARY NAIL INTERTROCHANTRIC LEFT HIP;  Surgeon: Marianna Payment, MD;  Location: [place];  Service: Orthopedics;  Laterality: Left;    There were no vitals filed for this visit.  Visit Diagnosis:  History of stroke with residual effects - Plan: PT plan of care cert/re-cert  General weakness - Plan: PT plan of care cert/re-cert  Abnormal posture - Plan: PT plan of care cert/re-cert  Abnormality of gait - Plan: PT plan of care cert/re-cert  At high risk for falls - Plan: PT plan of care  cert/re-cert  Impaired functional mobility, balance, gait, and endurance - Plan: PT plan of care cert/re-cert  Unsteadiness - Plan: PT plan of care cert/re-cert      Subjective Assessment - 01/14/15 1622    Subjective Caregiver states that transfers to and from the potty are the hardest thing right now; bed mobility is going well overall. Transfers with hand over hand assist. Steps are also difficult. Caregiver also reports that patient has good days and bad days, and bad days patient appears very worried about falls.    Pertinent History September 2015; patient was at [facility] from October to December of last year. Also had HHPT. Patient recently had an injury to her shoulder at home.    Currently in Pain? No/denies  discomfort from burning in feet             [facility] PT Assessment - 01/14/15 0001    Assessment   Medical Diagnosis history of stroke with current residual effects    Onset Date/Surgical Date --  2015   Next MD Visit check back next appointment    Prior Therapy [facility] and HHPT    Precautions   Precautions Fall   Precaution Comments weak L side    Restrictions   Weight Bearing Restrictions No   Balance Screen   Has the patient fallen in the past 6 months Yes   How many times? 1- fell out of bed    Has the patient had a decrease in activity level because of a fear of falling?  Yes   Is the patient reluctant to leave their home because of a fear of falling?  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, kyphotic posture, forward head, slumped in WC, impaired sitting balance   Strength   Overall Strength Comments weak trunk  musculature, approx 2/5 to 2+/5, quick to fatigue proximal muscles    Right Hip Flexion 3-/5   Left Hip Flexion 3-/5   Right Knee Flexion 2+/5   Right Knee Extension 3-/5   Left Knee Flexion 2+/5   Left Knee Extension 3-/5   Right Ankle Dorsiflexion 3/5   Left Ankle Dorsiflexion 3/5   Bed Mobility   Supine to Sit 3: Mod assist   Supine to Sit Details (indicate cue type and reason) difficulty with sequencing and compensatory technique, weak core strength    Sit to Supine 4: Min guard   Transfers  Transfers Sit to [facility]   Sit to Stand 3: Mod assist   Sit to Stand Details (indicate cue type and reason) 2 wheeled walker   Stand Pivot Transfers 3: Mod assist   Stand Pivot Transfer Details (indicate cue type and reason) able to do with Min(A) and walker unless LOB occurs, then requires Mod-Max(A) to recover balance.    Ambulation/Gait   Ambulation/Gait Yes   Ambulation Distance (Feet) 50 Feet   Assistive device Standard walker   Gait Pattern Step-through pattern;Decreased step length - right;Decreased step length - left;Decreased stance time - right;Decreased stance time - left;Decreased dorsiflexion - right;Decreased dorsiflexion - left;Decreased weight shift to left;Decreased trunk rotation;Lateral hip instability;Trunk flexed;Narrow base of support;Poor foot clearance - left;Poor foot clearance - right   Ambulation Surface Level   Gait Comments high fear of falling today, unsafe use of assistive device, narrow BOS, poor postur     Patient did require Max(A) to prevent fall after attempting to initiate gait today; high fear of falling.                      PT Education - 01/14/15 1631    Education provided Yes   Education Details plan of care moving forward, prognosis, HEP; requested that caregiver bring patient's current home exercise plan from [place] for modification   Person(s) Educated Patient;Child(ren);Caregiver(s)   Methods  Explanation;Handout   Comprehension Verbalized understanding;Need further instruction          PT Short Term Goals - 01/14/15 1639    PT SHORT TERM GOAL #1   Title Patient will demonstrate at least 4/5 strength in bilateral lower extremities, core, and proximal musculature to improve mobiltiy and stability, reduce fall risk    Time 4   Period Weeks   Status New   PT SHORT TERM GOAL #2   Title Patient will be able to perform all bed mobility, including rolling and supine to sit/sit to supine, with supervision and no cues for sequencing or safety    Time 4   Period Weeks   Status New   PT SHORT TERM GOAL #3   Title Patient will be able to perform all functional transfers, including sit to stand and bed to [facility] transfer with 2 wheeled walker and close supervision, occasional cues for safety    Time 4   Period Weeks   Status New   PT SHORT TERM GOAL #4   Title Patient will be able to ambulate at least 217ft with 2 wheeled walker, Min guard, improved gait mechanics including improved BOS, proved posture, and only occasional unsteadiness    Time 4   Period Weeks   Status New   PT SHORT TERM GOAL #5   Title Patient and caregivers will be independent in correctly and consistently performing appropriate HEP, to be updated PRN    Time 4   Period Weeks   Status New           PT Long Term Goals - 01/14/15 1642    PT LONG TERM GOAL #1   Title Patient will be Mod(I) in bed mobility, including rolling, supine to sit/sit to supine, and lateral scooting along bed while sitting EOB with no LOB or unsteadiness    Time 8   Period Weeks   Status New   PT LONG TERM GOAL #2   Title Patient will be able to perform all functional transfers, including sit to stand/stand to sit and bed to chair/chair  to bed transfers with LRAD and Mod(I)   Time 8   Period Weeks   Status New   PT LONG TERM GOAL #3   Title Patient will be able to ambulate at least 1026ft with LRAD and distant S, no LOB or  unsteadiness throughout, reduced overall fall risk    Time 8   Period Weeks   Status New   PT LONG TERM GOAL #4   Title Patient will be able to ascend/descend at least 4 steps with U HHA and LRAD, min guard, with only occasional unsteadiness during task    Time 8   Period Weeks   Status New   PT LONG TERM GOAL #5   Title Patient to be able to tolerate at least 15 minutes of unsupported sitting with no unsteadiness or LOB during task    Time 8   Period Weeks   Status New               Plan - 2015/02/11 1633    Clinical Impression Statement Patient presents with generalized weakness, postural and gait abnormalities, very high fear of and risk of falling, reduced functional activity tolerance, impaired functional mobility, reduced balance, reduced safety awareness, and reduced abiltiy to participate in functional tasks efficiently. Patient also demonstrates generlized weakness on L side due to residual effects of CVA. Caregiver and family present throughout evaluation and stated that patient has good days and bad days, and that when bad days occur the patient really does have a hard time with mobiltiy. Patient did appear limited by back pain at this time as well. At this time patient will benefit from skilled PT services to address her impairments and assist her in reaching an optimal level of function; also requested OT referral from MD to further improve patient's function.    Pt will benefit from skilled therapeutic intervention in order to improve on the following deficits Abnormal gait;Decreased endurance;Impaired tone;Decreased activity tolerance;Decreased knowledge of use of DME;Decreased strength;Pain;Impaired UE functional use;Decreased balance;Decreased mobility;Difficulty walking;Impaired perceived functional ability;Improper body mechanics;Decreased coordination;Decreased safety awareness;Impaired flexibility;Postural dysfunction   Rehab Potential Fair   PT Frequency Other  (comment)  3x/week for 4 weeks, then 2x/week for 4 more weeks    PT Duration Other (comment)  3x/week for 4 weeks, then 2x/week for 4 more weeks    PT Treatment/Interventions ADLs/Self Care Home Management;Biofeedback;Electrical Stimulation;DME Instruction;Gait training;Stair training;Functional mobility training;Therapeutic activities;Therapeutic exercise;Balance training;Neuromuscular re-education;Patient/family education;Manual techniques;Energy conservation;Taping   PT Next Visit Plan review HEP and goals; functional mobility and safety trainnig; funtional strength; gait training possibly in body weight support system    PT Home Exercise Plan given    Consulted and Agree with Plan of Care Patient;Family member/caregiver   Family Member Consulted family and home aide           G-Codes - 2015/02/11 1648    Functional Assessment Tool Used Based on skilled clinical assessment of strength, functional mobility, balance, gait, posture, functional safety    Functional Limitation Mobility: Walking and moving around   Mobility: Walking and Moving Around Current Status [phone]) At least 60 percent but less than 80 percent impaired, limited or restricted   Mobility: Walking and Moving Around Goal Status [phone]) At least 40 percent but less than 60 percent impaired, limited or restricted       Problem List Patient Active Problem List   Diagnosis Date Noted  . Focal motor seizure disorder 12/24/2014  . Hospital discharge follow-up 12/21/2014  . Medicare annual wellness visit,  subsequent 11/28/2014  . Seasonal allergies 11/28/2014  . History of stroke with current residual effects 11/20/2014  . Atrial fibrillation 07/14/2014  . Sleep apnea 07/14/2014  . Senile dementia with behavioral disturbance 07/14/2014  . Moderate tricuspid regurgitation 07/14/2014  . Left arm weakness 07/14/2014  . Hyperlipidemia LDL goal <70 07/08/2014  . Cough 05/29/2014  . Cardioembolic stroke 50/56/9794  . Anxiety  state 05/01/2014  . CVA (cerebral infarction) 03/28/2014  . Cerebral embolism with cerebral infarction 03/23/2014  . Fracture, intertrochanteric, left femur 03/20/2014  . Fall at home 03/20/2014  . Allergic rhinitis 10/15/2013  . Insomnia 10/15/2013  . Polyneuropathy in other diseases classified elsewhere 10/03/2013  . Chronic atrial fibrillation 04/09/2013  . DNR (do not resuscitate) 02/13/2013  . Hearing loss 02/13/2013  . Muscle weakness (generalized) 11/24/2012  . Left shoulder pain 11/13/2012  . Unspecified constipation 09/05/2012  . FH: colon cancer 02/23/2012  . Memory loss 11/08/2011  . Other vitamin B12 deficiency anemia 11/08/2011  . Essential and other specified forms of tremor 11/08/2011  . Osteoporosis, senile   . Prediabetes 03/16/2011  . SPINAL STENOSIS, LUMBAR 11/20/2009  . SCIATICA 11/13/2009  . OSTEOARTHRITIS, KNEE, RIGHT 09/11/2009  . UNSTEADY GAIT 09/11/2009  . Essential hypertension, benign 01/02/2009  . GERD 01/02/2009  . HEMORRHAGE OF RECTUM AND ANUS 01/02/2009  . PAIN IN JOINT, HAND 11/20/2008  . Pain in joint, pelvic region and thigh 11/20/2008  . ARTHRITIS, LEFT FOOT 07/17/2007    Deniece Ree PT, DPT [place] [address] [place], [place], [address] Phone: [phone]   Fax:  [phone]

## 2015-01-15 ENCOUNTER — Other Ambulatory Visit: Payer: Self-pay | Admitting: *Deleted

## 2015-01-15 MED ORDER — POLYETHYLENE GLYCOL 3350 17 G PO PACK: 17.0000 g | PACK | Freq: Every day | ORAL | Status: DC

## 2015-01-23 ENCOUNTER — Ambulatory Visit (HOSPITAL_COMMUNITY): Payer: Medicare Other | Admitting: Physical Therapy

## 2015-01-23 ENCOUNTER — Ambulatory Visit: Payer: Medicare Other | Admitting: Neurology

## 2015-01-23 ENCOUNTER — Telehealth: Payer: Self-pay | Admitting: Orthopedic Surgery

## 2015-01-23 ENCOUNTER — Other Ambulatory Visit: Payer: Self-pay | Admitting: *Deleted

## 2015-01-23 DIAGNOSIS — I6930 Unspecified sequelae of cerebral infarction: Secondary | ICD-10-CM | POA: Diagnosis not present

## 2015-01-23 DIAGNOSIS — R269 Unspecified abnormalities of gait and mobility: Secondary | ICD-10-CM

## 2015-01-23 DIAGNOSIS — Z7409 Other reduced mobility: Secondary | ICD-10-CM

## 2015-01-23 DIAGNOSIS — R531 Weakness: Secondary | ICD-10-CM

## 2015-01-23 DIAGNOSIS — R293 Abnormal posture: Secondary | ICD-10-CM

## 2015-01-23 DIAGNOSIS — Z9181 History of falling: Secondary | ICD-10-CM

## 2015-01-23 DIAGNOSIS — R2681 Unsteadiness on feet: Secondary | ICD-10-CM

## 2015-01-23 MED ORDER — HYDROCODONE-ACETAMINOPHEN 5-325 MG PO TABS: 1.0000 | ORAL_TABLET | Freq: Four times a day (QID) | ORAL | Status: DC | PRN

## 2015-01-23 NOTE — Patient Instructions (Signed)
Bridging   Slowly raise buttocks from floor, keeping stomach tight. Repeat _10 x2x/day http://orth.exer.us/1096   Copyright  VHI. All rights reserved.  Strengthening: Hip Adduction - Isometric   With ball or folded pillow between knees, squeeze knees together. Hold _3-5___ seconds. Repeat _10___ times per set. Do __1__ sets per session. Do __2__ sessions per day.  http://orth.exer.us/612   Copyright  VHI. All rights reserved.  Strengthening: Straight Leg Raise (Phase 1)   Tighten muscles on front of left thigh, then lift leg __18__ inches from surface, keeping knee locked.  Repeat _10__ times per set. Do __1__ sets per session. Do ___2 sessions per day.  http://orth.exer.us/614   Copyright  VHI. All rights reserved.  Hip Abduction / Adduction: with Extended Knee (Supine)   Bring left leg out to side and return. Keep knee straight. Repeat _10___ times per set. Do __1__ sets per session. Do __2__ sessions per day.  http://orth.exer.us/680   Copyright  VHI. All rights reserved.

## 2015-01-23 NOTE — Telephone Encounter (Signed)
Call received from patient/grandson, Erin [name], who is on her designated party contact list, requesting refill of medication: HYDROcodone-acetaminophen (NORCO/VICODIN) 5-325 MG per tablet [[id number]] - patient's next scheduled appointment is 02/13/15.  Ph# [phone]

## 2015-01-23 NOTE — Therapy (Signed)
[place], [place], [address] Phone: [phone]   Fax:  [phone]  Physical Therapy Treatment  Patient Details  Name: Erin [name] MRN: [id number] Date of Birth: 12/16/1929 Referring Provider:  Fayrene Helper, MD  Encounter Date: 01/23/2015      PT End of Session - 01/23/15 1429    Visit Number 2   Number of Visits 20   Date for PT Re-Evaluation 02/11/15   Authorization Type Medicare/[employer]    Authorization Time Period 01/14/15 to 03/17/15   Authorization - Visit Number 2   Authorization - Number of Visits 10   PT Start Time 1400   PT Stop Time 1430   PT Time Calculation (min) 30 min      Past Medical History  Diagnosis Date  . Anxiety   . Arthritis   . Hyperlipidemia   . Essential hypertension, benign   . Osteoporosis   . GERD (gastroesophageal reflux disease)   . Hearing loss   . Restless leg   . Tremor   . Leg edema   . Obese   . Gall bladder disease   . Cataract   . Carpal tunnel syndrome of left wrist   . Atrial fibrillation   . Sleep apnea   . Esophageal dilatation   . Frequent falls   . History of nuclear stress test 08/31/2012    Lexiscan cardiolite negative for ischemia  . Polyneuropathy in other diseases classified elsewhere 10/03/2013  . Cardioembolic stroke 66/44/0347  . UTI (lower urinary tract infection)   . History of stroke with current residual effects 11/20/2014  . Seizures   . Focal motor seizure disorder 12/24/2014    Left leg involvment    Past Surgical History  Procedure Laterality Date  . Appendectomy    . Cholecystectomy    . Abdominal hysterectomy    . Fracture surgery      Left arm x4  . Benign cyst removed from kidney and lung, bilateral mastectomy    . Bilateral great toenail removal    . Knee arthroscopy  2012    Right knee, Dr Aline Brochure  . Left forearm    . Breast surgery      Bilateral 2000  . Tonsillectomy    . R hand surgery    .  Bronchoscopy  02/15/2000  . Right vats.  "  . Right upper lobe wedge resection.  "  . Upper gastrointestinal endoscopy  11/25/1999    Esophagitis/ Normal proximal esophagus, stomach and duodenum  . Colonoscopy  01/17/2009    Dr. Deatra Ina : Internal hemorrhoids/Diverticula, scattered in the ascending colon/  Moderate diverticulosis ascending colon to sigmoid colon  . Esophagogastroduodenoscopy   04/23/2003    Dr. Deatra Ina: HIATAL HERNIA  . Colonoscopy  01/16/2001    Normal  . Cataract extraction, bilateral    . Esophagogastroduodenoscopy  03/20/2012    QQV:ZDGLOVFI ring-LIKELY CAUSING MILD DYSPHAGIA/SMALL hiatal hernia/Multiple sessile polyps ranging between 3-94mm , path benign  . Cataract extraction    . Carpal tunnel release      Left wrist  . Transthoracic echocardiogram  06/24/2009    EF=>55%, mild assymetric LVH; LA mildly dilated; mild mitral annular calcif, borderline MVP, mild-mod MR; mild-mod TR, RV systolic pressure elevated, mild pulm HTN; AV mildly sclerotic; mild pulm valve regurg - ordered r/t bradycardia   . Endovenous ablation saphenous vein w/ laser  01/2011    Right GSV  . Cholecystectomy    .  Intramedullary (im) nail intertrochanteric Left 03/25/2014    Procedure: INTRAMEDULLARY NAIL INTERTROCHANTRIC LEFT HIP;  Surgeon: Marianna Payment, MD;  Location: [facility];  Service: Orthopedics;  Laterality: Left;    There were no vitals filed for this visit.  Visit Diagnosis:  History of stroke with residual effects  General weakness  Abnormal posture  Abnormality of gait  At high risk for falls  Impaired functional mobility, balance, gait, and endurance  Unsteadiness           [facility] Adult PT Treatment/Exercise - 01/23/15 0001    Exercises   Exercises Knee/Hip   Knee/Hip Exercises: Standing   Other Standing Knee Exercises siide stepping x 5 ft 1 RT    Other Standing Knee Exercises gait with rolling walker x  45 '   Knee/Hip Exercises: Seated   Sit to Sand 5  reps   Knee/Hip Exercises: Supine   Bridges 10 reps   Straight Leg Raises Limitations 10   Other Supine Knee/Hip Exercises hip ab/adduction    Other Supine Knee/Hip Exercises adduction isometric x 10                 PT Education - 01/23/15 1429    Education provided Yes   Education Details HEP    Person(s) Educated Patient;Child(ren)   Methods Explanation;Verbal cues;Handout   Comprehension Verbalized understanding;Returned demonstration          PT Short Term Goals - 01/14/15 1639    PT SHORT TERM GOAL #1   Title Patient will demonstrate at least 4/5 strength in bilateral lower extremities, core, and proximal musculature to improve mobiltiy and stability, reduce fall risk    Time 4   Period Weeks   Status New   PT SHORT TERM GOAL #2   Title Patient will be able to perform all bed mobility, including rolling and supine to sit/sit to supine, with supervision and no cues for sequencing or safety    Time 4   Period Weeks   Status New   PT SHORT TERM GOAL #3   Title Patient will be able to perform all functional transfers, including sit to stand and bed to [facility] transfer with 2 wheeled walker and close supervision, occasional cues for safety    Time 4   Period Weeks   Status New   PT SHORT TERM GOAL #4   Title Patient will be able to ambulate at least 233ft with 2 wheeled walker, Min guard, improved gait mechanics including improved BOS, proved posture, and only occasional unsteadiness    Time 4   Period Weeks   Status New   PT SHORT TERM GOAL #5   Title Patient and caregivers will be independent in correctly and consistently performing appropriate HEP, to be updated PRN    Time 4   Period Weeks   Status New           PT Long Term Goals - 01/14/15 1642    PT LONG TERM GOAL #1   Title Patient will be Mod(I) in bed mobility, including rolling, supine to sit/sit to supine, and lateral scooting along bed while sitting EOB with no LOB or unsteadiness    Time 8    Period Weeks   Status New   PT LONG TERM GOAL #2   Title Patient will be able to perform all functional transfers, including sit to stand/stand to sit and bed to chair/chair to bed transfers with LRAD and Mod(I)   Time 8   Period  Weeks   Status New   PT LONG TERM GOAL #3   Title Patient will be able to ambulate at least 102ft with LRAD and distant S, no LOB or unsteadiness throughout, reduced overall fall risk    Time 8   Period Weeks   Status New   PT LONG TERM GOAL #4   Title Patient will be able to ascend/descend at least 4 steps with U HHA and LRAD, min guard, with only occasional unsteadiness during task    Time 8   Period Weeks   Status New   PT LONG TERM GOAL #5   Title Patient to be able to tolerate at least 15 minutes of unsupported sitting with no unsteadiness or LOB during task    Time 8   Period Weeks   Status New               Plan - 01/23/15 1430    Clinical Impression Statement Pt 15' late for appointment.  Children states it was a bath day and pt became too fatigued state they will no longer give pt baths on therapy days. Pt began sit to stand, side stepping mat exercisese and gt.  Pt needs therapist faciliitation with all exercises for safety an for proper technique.     Pt will benefit from skilled therapeutic intervention in order to improve on the following deficits Abnormal gait;Decreased endurance;Impaired tone;Decreased activity tolerance;Decreased knowledge of use of DME;Decreased strength;Pain;Impaired UE functional use;Decreased balance;Decreased mobility;Difficulty walking;Impaired perceived functional ability;Improper body mechanics;Decreased coordination;Decreased safety awareness;Impaired flexibility;Postural dysfunction   PT Treatment/Interventions ADLs/Self Care Home Management;Biofeedback;Electrical Stimulation;DME Instruction;Gait training;Stair training;Functional mobility training;Therapeutic activities;Therapeutic exercise;Balance  training;Neuromuscular re-education;Patient/family education;Manual techniques;Energy conservation;Taping   PT Next Visit Plan work on bed mobility; standing hip abduciton and heel raises        Problem List Patient Active Problem List   Diagnosis Date Noted  . Focal motor seizure disorder 12/24/2014  . Hospital discharge follow-up 12/21/2014  . Medicare annual wellness visit, subsequent 11/28/2014  . Seasonal allergies 11/28/2014  . History of stroke with current residual effects 11/20/2014  . Atrial fibrillation 07/14/2014  . Sleep apnea 07/14/2014  . Senile dementia with behavioral disturbance 07/14/2014  . Moderate tricuspid regurgitation 07/14/2014  . Left arm weakness 07/14/2014  . Hyperlipidemia LDL goal <70 07/08/2014  . Cough 05/29/2014  . Cardioembolic stroke 81/07/7508  . Anxiety state 05/01/2014  . CVA (cerebral infarction) 03/28/2014  . Cerebral embolism with cerebral infarction 03/23/2014  . Fracture, intertrochanteric, left femur 03/20/2014  . Fall at home 03/20/2014  . Allergic rhinitis 10/15/2013  . Insomnia 10/15/2013  . Polyneuropathy in other diseases classified elsewhere 10/03/2013  . Chronic atrial fibrillation 04/09/2013  . DNR (do not resuscitate) 02/13/2013  . Hearing loss 02/13/2013  . Muscle weakness (generalized) 11/24/2012  . Left shoulder pain 11/13/2012  . Unspecified constipation 09/05/2012  . FH: colon cancer 02/23/2012  . Memory loss 11/08/2011  . Other vitamin B12 deficiency anemia 11/08/2011  . Essential and other specified forms of tremor 11/08/2011  . Osteoporosis, senile   . Prediabetes 03/16/2011  . SPINAL STENOSIS, LUMBAR 11/20/2009  . SCIATICA 11/13/2009  . OSTEOARTHRITIS, KNEE, RIGHT 09/11/2009  . UNSTEADY GAIT 09/11/2009  . Essential hypertension, benign 01/02/2009  . GERD 01/02/2009  . HEMORRHAGE OF RECTUM AND ANUS 01/02/2009  . PAIN IN JOINT, HAND 11/20/2008  . Pain in joint, pelvic region and thigh 11/20/2008  .  ARTHRITIS, LEFT FOOT 07/17/2007    Rayetta Humphrey, PT CLT [phone] 01/23/2015, 2:34 PM  [place], [place], [address] Phone: [phone]   Fax:  [phone]

## 2015-01-24 NOTE — Telephone Encounter (Signed)
Prescription available, patient aware  

## 2015-01-24 NOTE — Telephone Encounter (Signed)
Prescription picked up. 

## 2015-01-27 ENCOUNTER — Other Ambulatory Visit: Payer: Self-pay | Admitting: *Deleted

## 2015-01-27 ENCOUNTER — Encounter: Payer: Self-pay | Admitting: *Deleted

## 2015-01-27 NOTE — Patient Outreach (Signed)
[facility]) Care Erin [name]   01/27/2015  Erin Erin [name] 01-23-1930 [id number]  Erin Erin [name] is an 81 y.o. female  Subjective: Erin Erin [name] is an 81 year old lady who has a history of HTN, afib, and cardioembolic stroke in September of 2015. [facility] Care Erin [name] has been following Erin Erin [name] in the community off and on for > 2 years for Erin [name] of HTN. Unfortunately, she suffered another significant stroke in January and fell, sustaining a left femur fracture. She was subsequently hospitalized and spent some time at [facility] for rehabilitation after her hospitalization. Erin Erin [name] discharged to her home where her adult son Erin Erin [name] lived with her. Erin Erin [name] did not do well in this setting, unfortunately suffering several falls and a shoulder fracture. She is now and plans to live with her very supportive son Erin Erin [name] indefinitely. Erin Erin [name] is still very deconditioned but drastically improving. She has made one trip to outpatient PT and is requesting continuation of HHPT.    Objective: BP 137/72 mmHg  Pulse 68  SpO2 93%  Review of Systems  Constitutional: Negative.   HENT:       Chronic  Eyes: Negative.   Respiratory: Negative.   Cardiovascular: Negative.   Gastrointestinal: Negative.   Genitourinary: Negative.   Musculoskeletal: Positive for myalgias. Negative for falls.  Skin: Negative.   Neurological: Positive for tingling, focal weakness and headaches.       Neuropathic burning/tingling bilateral lower extremities; left sided weakness most pronounced in arm/leg secondary to CVA in September  Endo/Heme/Allergies: Negative.   Psychiatric/Behavioral: The patient is nervous/anxious.        Nervous/anxious at times    Physical Exam  Constitutional: She is oriented to person, place, and time. Vital signs are normal. She appears well-developed and well-nourished. She is active.  Cardiovascular: Normal rate and regular rhythm.   Respiratory:  Effort normal and breath sounds normal.  GI: Soft. Bowel sounds are normal.  Musculoskeletal:       Left shoulder: She exhibits decreased range of motion and decreased strength.       Left elbow: She exhibits decreased range of motion.       Left wrist: She exhibits decreased range of motion.       Left hip: She exhibits decreased range of motion and decreased strength.       Left knee: She exhibits decreased range of motion.       Left ankle: She exhibits decreased range of motion.  Neurological: She is alert and oriented to person, place, and time.  Skin: Skin is warm and dry.  Psychiatric: She has a normal mood and affect. Her speech is normal and behavior is normal. Judgment and thought content normal. Cognition and memory are normal.    Current Medications:   Current Outpatient Prescriptions  Medication Sig Dispense Refill  . acetaminophen (TYLENOL) 500 MG tablet Take 1,000 mg by mouth every 6 (six) hours as needed for mild pain or moderate pain.    [name] ALPRAZolam (XANAX) 0.5 MG tablet Take 1 tablet (0.5 mg total) by mouth at bedtime as needed for anxiety. 30 tablet 5  . apixaban (ELIQUIS) 5 MG TABS tablet Take 1 tablet (5 mg total) by mouth 2 (two) times daily. 60 tablet 6  . atorvastatin (LIPITOR) 20 MG tablet Take 1 tablet by mouth daily.    . busPIRone (BUSPAR) 5 MG tablet TAKE ONE TABLET BY MOUTH TWICE DAILY 60 tablet 0  . donepezil (ARICEPT) 5 MG tablet  TAKE ONE TABLET BY MOUTH AT BEDTIME 30 tablet 3  . gabapentin (NEURONTIN) 100 MG capsule One capsule in the morning and midday, 3 capsules at bedtime 150 capsule 3  . HYDROcodone-acetaminophen (NORCO/VICODIN) 5-325 MG per tablet Take 1 tablet by mouth every 6 (six) hours as needed for moderate pain. 30 tablet 0  . levETIRAcetam (KEPPRA) 750 MG tablet Take 1 tablet (750 mg total) by mouth 2 (two) times daily. 60 tablet 1  . magnesium hydroxide (MILK OF MAGNESIA) 400 MG/5ML suspension Take by mouth daily as needed for mild  constipation.    . metoprolol (LOPRESSOR) 50 MG tablet TAKE ONE-HALF TABLET BY MOUTH THREE TIMES DAILY 45 tablet 4  . mirtazapine (REMERON) 45 MG tablet Take 1 tablet (45 mg total) by mouth at bedtime. 30 tablet 5  . montelukast (SINGULAIR) 10 MG tablet Take 1 tablet (10 mg total) by mouth at bedtime. 30 tablet 3  . omeprazole (PRILOSEC) 20 MG capsule Take 1 capsule (20 mg total) by mouth 2 (two) times daily before a meal. Take 1 capsule by mouth twice daily at 6:30am and 9:00pm. 60 capsule 3  . polyethylene glycol (MIRALAX / GLYCOLAX) packet Take 17 g by mouth daily. 14 each 0  . POTASSIUM PO Take 1 tablet by mouth daily.     No current facility-administered medications for this visit.    Functional Status:   In your present state of health, do you have any difficulty performing the following activities: 01/27/2015 12/05/2014  Hearing? N N  Vision? N N  Difficulty concentrating or making decisions? Erin Erin [name]  Walking or climbing stairs? Y Y  Dressing or bathing? Y Y  Doing errands, shopping? Erin Erin [name]  Preparing Food and eating ? - Y  Using the Toilet? - Y  In the past six months, have you accidently leaked urine? - Y  Do you have problems with loss of bowel control? - N  Managing your Medications? - Y  Managing your Finances? - Y  Housekeeping or managing your Housekeeping? - Y    Fall/Depression Screening:    PHQ 2/9 Scores 01/27/2015 01/07/2015 12/05/2014 08/12/2014 07/17/2013 02/13/2013  PHQ - 2 Score 0 1 0 0 2 0  PHQ- 9 Score - - - - 3 -    Assessment:  Very frail 81 year old lady who recently had a stroke. Caregiving has been an issue and Erin Erin [name] is now living indefinitely with her son Erin Erin [name]. She has put her house on the market. Erin Erin [name] appeared as well, strong, and happy today as I have seen her since before she had her stroke in September. Her grandson has taken the role of "trainer" and helps her ambulate in the house 4 times daily. My hope and recommendation to Erin Erin [name] is that  she remain in the care of her son Erin Erin [name] and his family.   Erin Erin [name] saw Erin Erin [name], Neurologist last month and he increased her Gabapentin dose to help with neuropathic pain in her feet. She reports having much better control of burning and discomfort of her feet and therefore has also been able to sleep better.   Plan:  Very close surveillance by Erin Erin [name] for oversight and assessments.  Private duty caregivers arranged by family.  [facility] CM Care Plan Problem One        Patient Outreach from 01/27/2015 in [place] Problem One  Ongoing level of care concerns related to stroke/falls/fracture and family relationship concerns  Care Plan for Problem One  Not Active   [facility] Long Term Goal (31-90 days)  in the next 60 days patient and family members/caregivers will collaboarate to provide realistic plan for 24/7 care for patient as evidenced by conversations with patient and caregivers    [facility] Long Term Goal Start Date  12/05/14   [facility] Long Term Goal Met Date  01/07/15   Interventions for Problem One Long Term Goal  utiizing teachback method, discussed with patient and both sons the imortance of havin agreement and formalized plan for long term care for patient   [facility] CM Short Term Goal #1 (0-30 days)  in the next 30 days patient and sons will verbalize agreed upon long term plan for 24/7 care for patient   [facility] CM Short Term Goal #1 Start Date  12/05/14   [facility] CM Short Term Goal #1 Met Date  01/07/15   Interventions for Short Term Goal #1  utilizing teachback method, reviewed with patient and sons available options for long term care including continued in home care services (privately paid) and SNF care (if/when appropriate)   [facility] CM Short Term Goal #2 (0-30 days)  in the next 30 days, established plan for having approriate DME in patient's home and son's home   [facility] CM Short Term Goal #2 Start Date  12/05/14   [facility] CM Short Term Goal #2 Met Date  01/07/15   [facility]  CM Short Term Goal #3 (0-30 days)  in the next 14 days patient will resume HHPT as ordered by Erin Erin [name] as evidenced by patient and [facility] report of start of service   [facility] CM Short Term Goal #3 Start Date  12/05/14   [facility] CM Short Term Goal #3 Met Date  01/07/15   Interventions for Short Tern Goal #3  utilizing teachback method, reviewed with patient/sons plan to resume HHPT    [facility] CM Care Plan Problem Two        Patient Outreach from 01/27/2015 in [employer]   Care Plan Problem Two  Deconditioning and ADL/IADL needs related to stroke and multiple fractures over last months   Care Plan for Problem Two  Active   Interventions for Problem Two Long Term Goal   utilizing teachback method, reviewed with patient necessity of established and agreed upon long term plan of care that will be safe and will meet patient's daily care needs consistently   [facility] Long Term Goal (31-90) days  over the next 31 days patient will verbalize realistic plan for safe, long term care plan   [facility] Long Term Goal Start Date  01/07/15   [facility] CM Short Term Goal #1 (0-30 days)  over the next 30 days patient or son will verbalize successful hiring of in home care provider for hours each week when son/dtr-in-law must be out of the home for work   [facility] CM Short Term Goal #1 Start Date  01/07/15   Interventions for Short Term Goal #2   discussed with son Amada Jupiter and patient community resources available for in home care providers if the new contact they have does not work out if current caregiver cannot maintain current hours   [facility] CM Short Term Goal #2 (0-30 days)  over the next 30 days patient will recieve needed legal and real estate help to sell her home and receive appropriate secure payment   [facility] CM Short Term Goal #2 Start Date  01/07/15   Interventions for Short Term Goal #2  reviewed with patient and son legal services available to patient if she is unable to afford legal services (Legal Aid)      [place] Erin [name]  [phone]    .

## 2015-01-29 ENCOUNTER — Ambulatory Visit (HOSPITAL_COMMUNITY): Payer: Medicare Other

## 2015-01-29 ENCOUNTER — Ambulatory Visit (HOSPITAL_COMMUNITY): Payer: Medicare Other | Admitting: Physical Therapy

## 2015-01-29 DIAGNOSIS — I6930 Unspecified sequelae of cerebral infarction: Secondary | ICD-10-CM | POA: Diagnosis not present

## 2015-01-29 DIAGNOSIS — R269 Unspecified abnormalities of gait and mobility: Secondary | ICD-10-CM

## 2015-01-29 DIAGNOSIS — Z9181 History of falling: Secondary | ICD-10-CM

## 2015-01-29 DIAGNOSIS — R531 Weakness: Secondary | ICD-10-CM

## 2015-01-29 DIAGNOSIS — R2681 Unsteadiness on feet: Secondary | ICD-10-CM

## 2015-01-29 NOTE — Therapy (Signed)
[place], [place], [address] Phone: [phone]   Fax:  [phone]  Physical Therapy Treatment  Patient Details  Name: Erin [name] MRN: [id number] Date of Birth: [birth year] Referring Provider:  Fayrene Helper, MD  Encounter Date: 01/29/2015      PT End of Session - 01/29/15 1622    Visit Number 3   Number of Visits 20   Date for PT Re-Evaluation 02/11/15   Authorization Type Medicare/[employer]    Authorization Time Period 01/14/15 to 03/17/15   Authorization - Visit Number 3   Authorization - Number of Visits 10   PT Start Time 1324   PT Stop Time 1610   PT Time Calculation (min) 47 min   Equipment Utilized During Treatment Gait belt   Activity Tolerance Patient tolerated treatment well   Behavior During Therapy Anxious;WFL for tasks assessed/performed      Past Medical History  Diagnosis Date  . Anxiety   . Arthritis   . Hyperlipidemia   . Essential hypertension, benign   . Osteoporosis   . GERD (gastroesophageal reflux disease)   . Hearing loss   . Restless leg   . Tremor   . Leg edema   . Obese   . Gall bladder disease   . Cataract   . Carpal tunnel syndrome of left wrist   . Atrial fibrillation   . Sleep apnea   . Esophageal dilatation   . Frequent falls   . History of nuclear stress test 08/31/2012    Lexiscan cardiolite negative for ischemia  . Polyneuropathy in other diseases classified elsewhere 10/03/2013  . Cardioembolic stroke 40/04/2724  . UTI (lower urinary tract infection)   . History of stroke with current residual effects 11/20/2014  . Seizures   . Focal motor seizure disorder 12/24/2014    Left leg involvment    Past Surgical History  Procedure Laterality Date  . Appendectomy    . Cholecystectomy    . Abdominal hysterectomy    . Fracture surgery      Left arm x4  . Benign cyst removed from kidney and lung, bilateral mastectomy    . Bilateral great  toenail removal    . Knee arthroscopy  2012    Right knee, Dr Aline Brochure  . Left forearm    . Breast surgery      Bilateral 2000  . Tonsillectomy    . R hand surgery    . Bronchoscopy  02/15/2000  . Right vats.  "  . Right upper lobe wedge resection.  "  . Upper gastrointestinal endoscopy  11/25/1999    Esophagitis/ Normal proximal esophagus, stomach and duodenum  . Colonoscopy  01/17/2009    Dr. Deatra Ina : Internal hemorrhoids/Diverticula, scattered in the ascending colon/  Moderate diverticulosis ascending colon to sigmoid colon  . Esophagogastroduodenoscopy   04/23/2003    Dr. Deatra Ina: HIATAL HERNIA  . Colonoscopy  01/16/2001    Normal  . Cataract extraction, bilateral    . Esophagogastroduodenoscopy  03/20/2012    DGU:[id number] ring-LIKELY CAUSING MILD DYSPHAGIA/SMALL hiatal hernia/Multiple sessile polyps ranging between 3-32mm , path benign  . Cataract extraction    . Carpal tunnel release      Left wrist  . Transthoracic echocardiogram  06/24/2009    EF=>55%, mild assymetric LVH; LA mildly dilated; mild mitral annular calcif, borderline MVP, mild-mod MR; mild-mod TR, RV systolic pressure elevated, mild pulm HTN; AV mildly sclerotic; mild pulm valve regurg -  ordered r/t bradycardia   . Endovenous ablation saphenous vein w/ laser  01/2011    Right GSV  . Cholecystectomy    . Intramedullary (im) nail intertrochanteric Left 03/25/2014    Procedure: INTRAMEDULLARY NAIL INTERTROCHANTRIC LEFT HIP;  Surgeon: Marianna Payment, MD;  Location: [facility] [facility];  Service: Orthopedics;  Laterality: Left;    There were no vitals filed for this visit.  Visit Diagnosis:  History of stroke with residual effects  General weakness  Abnormality of gait  At high risk for falls  Unsteadiness      Subjective Assessment - 01/29/15 1619    Subjective Pt presents to PT with aid and grandson, who both act as caregivers. They report that they believe that the patient would benefit more from home health PT,  as she gets very anxious when leaving the home due to 6 steps to get up and down to get in and out of the house. They report that after PT sessions, she is unable to do much the next day due to increased levels of fatigue.    Patient is accompained by: Family member   Currently in Pain? No/denies                         [facility] Adult PT Treatment/Exercise - 01/29/15 0001    Ambulation/Gait   Ambulation/Gait Yes   Ambulation Distance (Feet) 50 Feet  x 3   Assistive device Rolling walker   Gait Pattern Step-through pattern;Decreased step length - right;Decreased step length - left;Decreased stance time - right;Decreased stance time - left;Decreased dorsiflexion - right;Decreased dorsiflexion - left;Decreased weight shift to left;Decreased trunk rotation;Lateral hip instability;Trunk flexed;Narrow base of support;Poor foot clearance - left;Poor foot clearance - right   Ambulation Surface Level   Gait Comments narrow BOS, increased anxiety, increased thoracic kyphosis   Knee/Hip Exercises: Standing   Other Standing Knee Exercises siide stepping x 5 ft 2 RT    Other Standing Knee Exercises seated cone rotation 5x2   Knee/Hip Exercises: Seated   [name] Arc Quad 2 sets;15 reps   Abduction/Adduction  15 reps;2 sets   Abd/Adduction Limitations green tband   Sit to [employer] 5 reps;2 sets   Knee/Hip Exercises: Supine   Bridges 2 sets;10 reps   Modalities   Modalities Moist Heat   Moist Heat Therapy   Number Minutes Moist Heat 10 Minutes   Moist Heat Location Lumbar Spine  applied during seated exercise                PT Education - 01/29/15 1622    Education provided Yes   Education Details Education on POC and goals, role of OP PT   Person(s) Educated Patient;Caregiver(s)   Methods Explanation   Comprehension Verbalized understanding          PT Short Term Goals - 01/14/15 1639    PT SHORT TERM GOAL #1   Title Patient will demonstrate at least 4/5 strength in  bilateral lower extremities, core, and proximal musculature to improve mobiltiy and stability, reduce fall risk    Time 4   Period Weeks   Status New   PT SHORT TERM GOAL #2   Title Patient will be able to perform all bed mobility, including rolling and supine to sit/sit to supine, with supervision and no cues for sequencing or safety    Time 4   Period Weeks   Status New   PT SHORT TERM GOAL #3   Title  Patient will be able to perform all functional transfers, including sit to stand and bed to [facility] transfer with 2 wheeled walker and close supervision, occasional cues for safety    Time 4   Period Weeks   Status New   PT SHORT TERM GOAL #4   Title Patient will be able to ambulate at least 271ft with 2 wheeled walker, Min guard, improved gait mechanics including improved BOS, proved posture, and only occasional unsteadiness    Time 4   Period Weeks   Status New   PT SHORT TERM GOAL #5   Title Patient and caregivers will be independent in correctly and consistently performing appropriate HEP, to be updated PRN    Time 4   Period Weeks   Status New           PT [name] Term Goals - 01/14/15 1642    PT [name] TERM GOAL #1   Title Patient will be Mod(I) in bed mobility, including rolling, supine to sit/sit to supine, and lateral scooting along bed while sitting EOB with no LOB or unsteadiness    Time 8   Period Weeks   Status New   PT [name] TERM GOAL #2   Title Patient will be able to perform all functional transfers, including sit to stand/stand to sit and bed to chair/chair to bed transfers with LRAD and Mod(I)   Time 8   Period Weeks   Status New   PT [name] TERM GOAL #3   Title Patient will be able to ambulate at least 1064ft with LRAD and distant S, no LOB or unsteadiness throughout, reduced overall fall risk    Time 8   Period Weeks   Status New   PT [name] TERM GOAL #4   Title Patient will be able to ascend/descend at least 4 steps with U HHA and LRAD, min guard, with only  occasional unsteadiness during task    Time 8   Period Weeks   Status New   PT [name] TERM GOAL #5   Title Patient to be able to tolerate at least 15 minutes of unsupported sitting with no unsteadiness or LOB during task    Time 8   Period Weeks   Status New               Plan - 01/29/15 1623    Clinical Impression Statement Pt and family wish to transition pt back to home health PT, reporting that she is too fatigued to continue to come to OP. They are agreeable to keeping pt on OP caseload until it is determined if HHPT will be able to see pt. Pt demonstrated increased anxiety during gait training today, becoming tearful at one point due to increased fatigue and reports of LLE giving out on her. She requires frequent rest breaks due to fatigue, as well as verbal and tactile cueing during all exercises for proper form. Pt requires verbal cueing during ambulation with RW and during functional transfers for safety.         Problem List Patient Active Problem List   Diagnosis Date Noted  . Focal motor seizure disorder 12/24/2014  . Hospital discharge follow-up 12/21/2014  . Medicare annual wellness visit, subsequent 11/28/2014  . Seasonal allergies 11/28/2014  . History of stroke with current residual effects 11/20/2014  . Atrial fibrillation 07/14/2014  . Sleep apnea 07/14/2014  . Senile dementia with behavioral disturbance 07/14/2014  . Moderate tricuspid regurgitation 07/14/2014  . Left arm weakness 07/14/2014  .  Hyperlipidemia LDL goal <70 07/08/2014  . Cough 05/29/2014  . Cardioembolic stroke 37/48/2707  . Anxiety state 05/01/2014  . CVA (cerebral infarction) 03/28/2014  . Cerebral embolism with cerebral infarction 03/23/2014  . Fracture, intertrochanteric, left femur 03/20/2014  . Fall at home 03/20/2014  . Allergic rhinitis 10/15/2013  . Insomnia 10/15/2013  . Polyneuropathy in other diseases classified elsewhere 10/03/2013  . Chronic atrial fibrillation  04/09/2013  . DNR (do not resuscitate) 02/13/2013  . Hearing loss 02/13/2013  . Muscle weakness (generalized) 11/24/2012  . Left shoulder pain 11/13/2012  . Unspecified constipation 09/05/2012  . FH: colon cancer 02/23/2012  . Memory loss 11/08/2011  . Other vitamin B12 deficiency anemia 11/08/2011  . Essential and other specified forms of tremor 11/08/2011  . Osteoporosis, senile   . Prediabetes 03/16/2011  . SPINAL STENOSIS, LUMBAR 11/20/2009  . SCIATICA 11/13/2009  . OSTEOARTHRITIS, KNEE, RIGHT 09/11/2009  . UNSTEADY GAIT 09/11/2009  . Essential hypertension, benign 01/02/2009  . GERD 01/02/2009  . HEMORRHAGE OF RECTUM AND ANUS 01/02/2009  . PAIN IN JOINT, HAND 11/20/2008  . Pain in joint, pelvic region and thigh 11/20/2008  . ARTHRITIS, LEFT FOOT 07/17/2007    Hilma Favors, PT, DPT [phone] 01/29/2015, 4:30 PM  [place], [place], [address] Phone: [phone]   Fax:  [phone]

## 2015-01-31 ENCOUNTER — Ambulatory Visit (HOSPITAL_COMMUNITY): Payer: Medicare Other | Admitting: Physical Therapy

## 2015-01-31 DIAGNOSIS — I6930 Unspecified sequelae of cerebral infarction: Secondary | ICD-10-CM | POA: Diagnosis not present

## 2015-01-31 DIAGNOSIS — R269 Unspecified abnormalities of gait and mobility: Secondary | ICD-10-CM

## 2015-01-31 DIAGNOSIS — Z9181 History of falling: Secondary | ICD-10-CM

## 2015-01-31 DIAGNOSIS — R2681 Unsteadiness on feet: Secondary | ICD-10-CM

## 2015-01-31 DIAGNOSIS — R293 Abnormal posture: Secondary | ICD-10-CM

## 2015-01-31 DIAGNOSIS — Z7409 Other reduced mobility: Secondary | ICD-10-CM

## 2015-01-31 DIAGNOSIS — R531 Weakness: Secondary | ICD-10-CM

## 2015-01-31 NOTE — Therapy (Signed)
[place], [place], [address] Phone: [phone]   Fax:  [phone]  Physical Therapy Treatment  Patient Details  Name: Erin [name] MRN: [id number] Date of Birth: Aug 04, 1929 Referring Provider:  Fayrene Helper, MD  Encounter Date: 01/31/2015      PT End of Session - 01/31/15 1633    Visit Number 4   Number of Visits 20   Date for PT Re-Evaluation 02/11/15   Authorization Type Medicare/[employer]    Authorization Time Period 01/14/15 to 03/17/15   Authorization - Visit Number 4   Authorization - Number of Visits 10   PT Start Time 9371   PT Stop Time 1611   PT Time Calculation (min) 55 min   Equipment Utilized During Treatment Gait belt   Activity Tolerance Patient tolerated treatment well;Patient limited by fatigue;Patient limited by pain   Behavior During Therapy [facility] for tasks assessed/performed;Anxious      Past Medical History  Diagnosis Date  . Anxiety   . Arthritis   . Hyperlipidemia   . Essential hypertension, benign   . Osteoporosis   . GERD (gastroesophageal reflux disease)   . Hearing loss   . Restless leg   . Tremor   . Leg edema   . Obese   . Gall bladder disease   . Cataract   . Carpal tunnel syndrome of left wrist   . Atrial fibrillation   . Sleep apnea   . Esophageal dilatation   . Frequent falls   . History of nuclear stress test 08/31/2012    Lexiscan cardiolite negative for ischemia  . Polyneuropathy in other diseases classified elsewhere 10/03/2013  . Cardioembolic stroke 69/67/8938  . UTI (lower urinary tract infection)   . History of stroke with current residual effects 11/20/2014  . Seizures   . Focal motor seizure disorder 12/24/2014    Left leg involvment    Past Surgical History  Procedure Laterality Date  . Appendectomy    . Cholecystectomy    . Abdominal hysterectomy    . Fracture surgery      Left arm x4  . Benign cyst removed from kidney and  lung, bilateral mastectomy    . Bilateral great toenail removal    . Knee arthroscopy  2012    Right knee, Dr Aline Brochure  . Left forearm    . Breast surgery      Bilateral 2000  . Tonsillectomy    . R hand surgery    . Bronchoscopy  02/15/2000  . Right vats.  "  . Right upper lobe wedge resection.  "  . Upper gastrointestinal endoscopy  11/25/1999    Esophagitis/ Normal proximal esophagus, stomach and duodenum  . Colonoscopy  01/17/2009    Dr. Deatra Ina : Internal hemorrhoids/Diverticula, scattered in the ascending colon/  Moderate diverticulosis ascending colon to sigmoid colon  . Esophagogastroduodenoscopy   04/23/2003    Dr. Deatra Ina: HIATAL HERNIA  . Colonoscopy  01/16/2001    Normal  . Cataract extraction, bilateral    . Esophagogastroduodenoscopy  03/20/2012    [id number] ring-LIKELY CAUSING MILD DYSPHAGIA/SMALL hiatal hernia/Multiple sessile polyps ranging between 3-24mm , path benign  . Cataract extraction    . Carpal tunnel release      Left wrist  . Transthoracic echocardiogram  06/24/2009    EF=>55%, mild assymetric LVH; LA mildly dilated; mild mitral annular calcif, borderline MVP, mild-mod MR; mild-mod TR, RV systolic pressure elevated, mild pulm HTN; AV mildly  sclerotic; mild pulm valve regurg - ordered r/t bradycardia   . Endovenous ablation saphenous vein w/ laser  01/2011    Right GSV  . Cholecystectomy    . Intramedullary (im) nail intertrochanteric Left 03/25/2014    Procedure: INTRAMEDULLARY NAIL INTERTROCHANTRIC LEFT HIP;  Surgeon: Marianna Payment, MD;  Location: [place];  Service: Orthopedics;  Laterality: Left;    There were no vitals filed for this visit.  Visit Diagnosis:  History of stroke with residual effects  General weakness  Abnormality of gait  At high risk for falls  Unsteadiness  Abnormal posture  Impaired functional mobility, balance, gait, and endurance      Subjective Assessment - 01/31/15 1625    Subjective Patient arrived with aide  and grandson, who are her caregivers. Caregivers report that patient has been having a lot of back pain today from sitting down too fast and bruising her back; however patient continues to state that her back is not bothering her today.    Patient is accompained by: Family member   Pertinent History September 2015; patient was at [facility] center from October to December of last year. Also had HHPT. Patient recently had an injury to her shoulder at home.    Currently in Pain? No/denies                         [facility] Adult PT Treatment/Exercise - 01/31/15 0001    Knee/Hip Exercises: Standing   Other Standing Knee Exercises Focused on sit to stand transfer training today with emphasis on achieving proper alignment and good balance over COM with wide base of support today, also safety during technique    Other Standing Knee Exercises Standing heel raises 1x10   Knee/Hip Exercises: Seated   Long Arc Quad Both;1 set;10 reps   Other Seated Knee/Hip Exercises seated marches 1x10 active assist; ankle pumps 1x15; forward reaching task to improve sit to stand    Abduction/Adduction  Both;1 set;10 reps   Abd/Adduction Limitations manual resistance                 PT Education - 01/31/15 1632    Education provided Yes   Education Details safety and technique for sit to stand transfers; patient education regarding energy conservation at home to make sure she has energy for therapy; caregiver education about techniques to assist in standing and finding good base of support before mobilizing    Person(s) Educated Patient;Caregiver(s)   Methods Explanation   Comprehension Verbalized understanding          PT Short Term Goals - 01/14/15 1639    PT SHORT TERM GOAL #1   Title Patient will demonstrate at least 4/5 strength in bilateral lower extremities, core, and proximal musculature to improve mobiltiy and stability, reduce fall risk    Time 4   Period Weeks   Status New   PT  SHORT TERM GOAL #2   Title Patient will be able to perform all bed mobility, including rolling and supine to sit/sit to supine, with supervision and no cues for sequencing or safety    Time 4   Period Weeks   Status New   PT SHORT TERM GOAL #3   Title Patient will be able to perform all functional transfers, including sit to stand and bed to [facility] transfer with 2 wheeled walker and close supervision, occasional cues for safety    Time 4   Period Weeks   Status New  PT SHORT TERM GOAL #4   Title Patient will be able to ambulate at least 233ft with 2 wheeled walker, Min guard, improved gait mechanics including improved BOS, proved posture, and only occasional unsteadiness    Time 4   Period Weeks   Status New   PT SHORT TERM GOAL #5   Title Patient and caregivers will be independent in correctly and consistently performing appropriate HEP, to be updated PRN    Time 4   Period Weeks   Status New           PT Long Term Goals - 01/14/15 1642    PT LONG TERM GOAL #1   Title Patient will be Mod(I) in bed mobility, including rolling, supine to sit/sit to supine, and lateral scooting along bed while sitting EOB with no LOB or unsteadiness    Time 8   Period Weeks   Status New   PT LONG TERM GOAL #2   Title Patient will be able to perform all functional transfers, including sit to stand/stand to sit and bed to chair/chair to bed transfers with LRAD and Mod(I)   Time 8   Period Weeks   Status New   PT LONG TERM GOAL #3   Title Patient will be able to ambulate at least 1058ft with LRAD and distant S, no LOB or unsteadiness throughout, reduced overall fall risk    Time 8   Period Weeks   Status New   PT LONG TERM GOAL #4   Title Patient will be able to ascend/descend at least 4 steps with U HHA and LRAD, min guard, with only occasional unsteadiness during task    Time 8   Period Weeks   Status New   PT LONG TERM GOAL #5   Title Patient to be able to tolerate at least 15 minutes of  unsupported sitting with no unsteadiness or LOB during task    Time 8   Period Weeks   Status New               Plan - 01/31/15 1633    Clinical Impression Statement Caregivers continue to report that fatigue is a large problem in coming to OP PT; they also report that the patient pushes herself at home and therefore ends up too fatigued to do very well in OP recently. Focused on seated exercises and on functional mobility for sit to stand transfer today; patient has great difficulty in eliminating narrow base of support, reducing posterior lean, and maintaining pelvis over good base of support at this time even with cues and facilitation from PT. Attempted various techniques to eliminate posterior LOB today and provided better proprioception for L UE without signfiicant improvement. Patient states she can tell when she is leaning but does have trouble fixing it, needs PT assist to do so. Very high fear of falling. Instructed caregivers on postural alignment and correction techniques at home; also educated patient regarding energy conservation at home. Educated fcaregivers that we are still waiting to hear back from MD in terms of tranitioning back to [place]. Patient insisted that she was not fatigued at end of session but physically appeared very tired after intensive mobility training.    Pt will benefit from skilled therapeutic intervention in order to improve on the following deficits Abnormal gait;Decreased endurance;Impaired tone;Decreased activity tolerance;Decreased knowledge of use of DME;Decreased strength;Pain;Impaired UE functional use;Decreased balance;Decreased mobility;Difficulty walking;Impaired perceived functional ability;Improper body mechanics;Decreased coordination;Decreased safety awareness;Impaired flexibility;Postural dysfunction   Rehab Potential Fair  PT Frequency Other (comment)   PT Duration Other (comment)   PT Treatment/Interventions ADLs/Self Care Home  Management;Biofeedback;Electrical Stimulation;DME Instruction;Gait training;Stair training;Functional mobility training;Therapeutic activities;Therapeutic exercise;Balance training;Neuromuscular re-education;Patient/family education;Manual techniques;Energy conservation;Taping   PT Next Visit Plan work on bed mobility; standing hip abduciton and heel raises   PT Home Exercise Plan given    Consulted and Agree with Plan of Care Patient;Family member/caregiver   Family Member Consulted family and home aide         Problem List Patient Active Problem List   Diagnosis Date Noted  . Focal motor seizure disorder 12/24/2014  . Hospital discharge follow-up 12/21/2014  . Medicare annual wellness visit, subsequent 11/28/2014  . Seasonal allergies 11/28/2014  . History of stroke with current residual effects 11/20/2014  . Atrial fibrillation 07/14/2014  . Sleep apnea 07/14/2014  . Senile dementia with behavioral disturbance 07/14/2014  . Moderate tricuspid regurgitation 07/14/2014  . Left arm weakness 07/14/2014  . Hyperlipidemia LDL goal <70 07/08/2014  . Cough 05/29/2014  . Cardioembolic stroke 48/54/6270  . Anxiety state 05/01/2014  . CVA (cerebral infarction) 03/28/2014  . Cerebral embolism with cerebral infarction 03/23/2014  . Fracture, intertrochanteric, left femur 03/20/2014  . Fall at home 03/20/2014  . Allergic rhinitis 10/15/2013  . Insomnia 10/15/2013  . Polyneuropathy in other diseases classified elsewhere 10/03/2013  . Chronic atrial fibrillation 04/09/2013  . DNR (do not resuscitate) 02/13/2013  . Hearing loss 02/13/2013  . Muscle weakness (generalized) 11/24/2012  . Left shoulder pain 11/13/2012  . Unspecified constipation 09/05/2012  . FH: colon cancer 02/23/2012  . Memory loss 11/08/2011  . Other vitamin B12 deficiency anemia 11/08/2011  . Essential and other specified forms of tremor 11/08/2011  . Osteoporosis, senile   . Prediabetes 03/16/2011  . SPINAL  STENOSIS, LUMBAR 11/20/2009  . SCIATICA 11/13/2009  . OSTEOARTHRITIS, KNEE, RIGHT 09/11/2009  . UNSTEADY GAIT 09/11/2009  . Essential hypertension, benign 01/02/2009  . GERD 01/02/2009  . HEMORRHAGE OF RECTUM AND ANUS 01/02/2009  . PAIN IN JOINT, HAND 11/20/2008  . Pain in joint, pelvic region and thigh 11/20/2008  . ARTHRITIS, LEFT FOOT 07/17/2007    Deniece Ree PT, DPT [facility] [address] [place], [place], [address] Phone: [phone]   Fax:  [phone]

## 2015-02-03 ENCOUNTER — Telehealth: Payer: Self-pay | Admitting: Orthopedic Surgery

## 2015-02-03 ENCOUNTER — Ambulatory Visit (HOSPITAL_COMMUNITY): Payer: Medicare Other | Admitting: Physical Therapy

## 2015-02-03 DIAGNOSIS — I6930 Unspecified sequelae of cerebral infarction: Secondary | ICD-10-CM | POA: Diagnosis not present

## 2015-02-03 DIAGNOSIS — Z9181 History of falling: Secondary | ICD-10-CM

## 2015-02-03 DIAGNOSIS — R293 Abnormal posture: Secondary | ICD-10-CM

## 2015-02-03 DIAGNOSIS — R2681 Unsteadiness on feet: Secondary | ICD-10-CM

## 2015-02-03 DIAGNOSIS — R531 Weakness: Secondary | ICD-10-CM

## 2015-02-03 DIAGNOSIS — Z7409 Other reduced mobility: Secondary | ICD-10-CM

## 2015-02-03 DIAGNOSIS — R269 Unspecified abnormalities of gait and mobility: Secondary | ICD-10-CM

## 2015-02-03 NOTE — Telephone Encounter (Signed)
Call received at approximately 2:15pm today, 02/03/15, from patient's granddaughter, Birda Didonato, who states she is the daughter of patient's son, Kalissa Grays, also patient's POA.  States patient had physical therapy today at [facility] out-patient rehab, and that it was mentioned that patient appears to have a dislocated shoulder.  I relayed that Dr Aline Brochure is out of office for the next several days, and that this is actually not an evaluation and procedure that is done in the office.  Also verified with our nurse, and relayed that Ms. Rider needs to be taken to the Emergency Room for work-up and treatment of shoulder problem. Granddaughter said she is the only one with her grandmother at this time, but would proceed in this way as soon as possible.  I encouraged her to have her seen there at this time.   As of 5:40pm today, 02/03/15, does not appear to have been at [facility] as of yet.  Patient's home ph [phone]

## 2015-02-03 NOTE — Therapy (Signed)
[place], [place], [address] Phone: [phone]   Fax:  [phone]  Physical Therapy Treatment  Patient Details  Name: Erin [name] MRN: [id number] Date of Birth: [birth year] Referring Provider:  Fayrene Helper, MD  Encounter Date: 02/03/2015      PT End of Session - 02/03/15 1622    Visit Number 5   Number of Visits 20   Date for PT Re-Evaluation 02/11/15   Authorization Type Medicare/[employer]    Authorization Time Period 01/14/15 to 03/17/15   Authorization - Visit Number 5   Authorization - Number of Visits 10   PT Start Time 8341   PT Stop Time 1345   PT Time Calculation (min) 38 min   Equipment Utilized During Treatment Gait belt   Activity Tolerance Patient limited by fatigue;Patient limited by pain   Behavior During Therapy [facility] for tasks assessed/performed      Past Medical History  Diagnosis Date  . Anxiety   . Arthritis   . Hyperlipidemia   . Essential hypertension, benign   . Osteoporosis   . GERD (gastroesophageal reflux disease)   . Hearing loss   . Restless leg   . Tremor   . Leg edema   . Obese   . Gall bladder disease   . Cataract   . Carpal tunnel syndrome of left wrist   . Atrial fibrillation   . Sleep apnea   . Esophageal dilatation   . Frequent falls   . History of nuclear stress test 08/31/2012    Lexiscan cardiolite negative for ischemia  . Polyneuropathy in other diseases classified elsewhere 10/03/2013  . Cardioembolic stroke 96/22/2979  . UTI (lower urinary tract infection)   . History of stroke with current residual effects 11/20/2014  . Seizures   . Focal motor seizure disorder 12/24/2014    Left leg involvment    Past Surgical History  Procedure Laterality Date  . Appendectomy    . Cholecystectomy    . Abdominal hysterectomy    . Fracture surgery      Left arm x4  . Benign cyst removed from kidney and lung, bilateral mastectomy    . Bilateral  great toenail removal    . Knee arthroscopy  2012    Right knee, Dr Aline Brochure  . Left forearm    . Breast surgery      Bilateral 2000  . Tonsillectomy    . R hand surgery    . Bronchoscopy  02/15/2000  . Right vats.  "  . Right upper lobe wedge resection.  "  . Upper gastrointestinal endoscopy  11/25/1999    Esophagitis/ Normal proximal esophagus, stomach and duodenum  . Colonoscopy  01/17/2009    Dr. Deatra Ina : Internal hemorrhoids/Diverticula, scattered in the ascending colon/  Moderate diverticulosis ascending colon to sigmoid colon  . Esophagogastroduodenoscopy   04/23/2003    Dr. Deatra Ina: HIATAL HERNIA  . Colonoscopy  01/16/2001    Normal  . Cataract extraction, bilateral    . Esophagogastroduodenoscopy  03/20/2012    GXQ:JJHERDEY ring-LIKELY CAUSING MILD DYSPHAGIA/SMALL hiatal hernia/Multiple sessile polyps ranging between 3-70mm , path benign  . Cataract extraction    . Carpal tunnel release      Left wrist  . Transthoracic echocardiogram  06/24/2009    EF=>55%, mild assymetric LVH; LA mildly dilated; mild mitral annular calcif, borderline MVP, mild-mod MR; mild-mod TR, RV systolic pressure elevated, mild pulm HTN; AV mildly sclerotic; mild pulm  valve regurg - ordered r/t bradycardia   . Endovenous ablation saphenous vein w/ laser  01/2011    Right GSV  . Cholecystectomy    . Intramedullary (im) nail intertrochanteric Left 03/25/2014    Procedure: INTRAMEDULLARY NAIL INTERTROCHANTRIC LEFT HIP;  Surgeon: Marianna Payment, MD;  Location: [facility];  Service: Orthopedics;  Laterality: Left;    There were no vitals filed for this visit.  Visit Diagnosis:  History of stroke with residual effects  General weakness  Abnormality of gait  At high risk for falls  Unsteadiness  Abnormal posture  Impaired functional mobility, balance, gait, and endurance      Subjective Assessment - 02/03/15 1309    Subjective Pt presents to PT with aide and grandson. She reports that all she  has done today is eat and take a bath, and she feels ok. Aide states that pt has been reporting increased back pain and fatigue today.    Patient is accompained by: Family member   Pain Score 5    Pain Location Shoulder   Pain Orientation Left                         [facility] Adult PT Treatment/Exercise - 02/03/15 0001    Ambulation/Gait   Ambulation/Gait Yes   Ambulation/Gait Assistance 4: Min assist;3: Mod assist   Ambulation Distance (Feet) 40 Feet  x2   Knee/Hip Exercises: Seated   Long Arc Quad 1 set;15 reps;Both   Other Seated Knee/Hip Exercises seated marches 1x10 active assist; ankle pumps 1x15   Abduction/Adduction  15 reps   Abd/Adduction Limitations green tband   Sit to Sand 5 reps;2 sets                PT Education - 02/03/15 1621    Education provided Yes   Education Details Educated on proper technique for sit to stand transfers, aide and grandson educated on using sling to prevent shoulder pain due to subluxation of L shoulder   Person(s) Educated Patient;Caregiver(s)   Methods Explanation   Comprehension Verbalized understanding          PT Short Term Goals - 01/14/15 1639    PT SHORT TERM GOAL #1   Title Patient will demonstrate at least 4/5 strength in bilateral lower extremities, core, and proximal musculature to improve mobiltiy and stability, reduce fall risk    Time 4   Period Weeks   Status New   PT SHORT TERM GOAL #2   Title Patient will be able to perform all bed mobility, including rolling and supine to sit/sit to supine, with supervision and no cues for sequencing or safety    Time 4   Period Weeks   Status New   PT SHORT TERM GOAL #3   Title Patient will be able to perform all functional transfers, including sit to stand and bed to [employer] transfer with 2 wheeled walker and close supervision, occasional cues for safety    Time 4   Period Weeks   Status New   PT SHORT TERM GOAL #4   Title Patient will be able to ambulate at  least 22ft with 2 wheeled walker, Min guard, improved gait mechanics including improved BOS, proved posture, and only occasional unsteadiness    Time 4   Period Weeks   Status New   PT SHORT TERM GOAL #5   Title Patient and caregivers will be independent in correctly and consistently performing appropriate HEP, to be  updated PRN    Time 4   Period Weeks   Status New           PT Long Term Goals - 01/14/15 1642    PT LONG TERM GOAL #1   Title Patient will be Mod(I) in bed mobility, including rolling, supine to sit/sit to supine, and lateral scooting along bed while sitting EOB with no LOB or unsteadiness    Time 8   Period Weeks   Status New   PT LONG TERM GOAL #2   Title Patient will be able to perform all functional transfers, including sit to stand/stand to sit and bed to chair/chair to bed transfers with LRAD and Mod(I)   Time 8   Period Weeks   Status New   PT LONG TERM GOAL #3   Title Patient will be able to ambulate at least 1034ft with LRAD and distant S, no LOB or unsteadiness throughout, reduced overall fall risk    Time 8   Period Weeks   Status New   PT LONG TERM GOAL #4   Title Patient will be able to ascend/descend at least 4 steps with U HHA and LRAD, min guard, with only occasional unsteadiness during task    Time 8   Period Weeks   Status New   PT LONG TERM GOAL #5   Title Patient to be able to tolerate at least 15 minutes of unsupported sitting with no unsteadiness or LOB during task    Time 8   Period Weeks   Status New               Plan - 02/03/15 1623    Clinical Impression Statement Treatment focused on improving functional mobility with gait and transfer training completed. Pt demonstrated increased fatigue in today's treatment , requiring increased verbal cueing to remain on task. She required min-mod A today for safety during ambulation, espeically when completing turns, due to narrow BOS and scissoring gait. Pt continues to demonstrate a  high fear of falling. She requires verbal and tactile cueing with sit to stand transfers to obtain upright posture, maintain proper base of support, and for proper hand placement. Pt c/o increased pain in her shoulder in today's treatment, and caregivers were educated that pt could possibly beneftit from a sling for L shoulder due to small subluxation of shoulder.    PT Next Visit Plan work on bed mobility; standing hip abduciton and heel raises        Problem List Patient Active Problem List   Diagnosis Date Noted  . Focal motor seizure disorder 12/24/2014  . Hospital discharge follow-up 12/21/2014  . Medicare annual wellness visit, subsequent 11/28/2014  . Seasonal allergies 11/28/2014  . History of stroke with current residual effects 11/20/2014  . Atrial fibrillation 07/14/2014  . Sleep apnea 07/14/2014  . Senile dementia with behavioral disturbance 07/14/2014  . Moderate tricuspid regurgitation 07/14/2014  . Left arm weakness 07/14/2014  . Hyperlipidemia LDL goal <70 07/08/2014  . Cough 05/29/2014  . Cardioembolic stroke 27/25/3664  . Anxiety state 05/01/2014  . CVA (cerebral infarction) 03/28/2014  . Cerebral embolism with cerebral infarction 03/23/2014  . Fracture, intertrochanteric, left femur 03/20/2014  . Fall at home 03/20/2014  . Allergic rhinitis 10/15/2013  . Insomnia 10/15/2013  . Polyneuropathy in other diseases classified elsewhere 10/03/2013  . Chronic atrial fibrillation 04/09/2013  . DNR (do not resuscitate) 02/13/2013  . Hearing loss 02/13/2013  . Muscle weakness (generalized) 11/24/2012  . Left shoulder pain  11/13/2012  . Unspecified constipation 09/05/2012  . FH: colon cancer 02/23/2012  . Memory loss 11/08/2011  . Other vitamin B12 deficiency anemia 11/08/2011  . Essential and other specified forms of tremor 11/08/2011  . Osteoporosis, senile   . Prediabetes 03/16/2011  . SPINAL STENOSIS, LUMBAR 11/20/2009  . SCIATICA 11/13/2009  . OSTEOARTHRITIS,  KNEE, RIGHT 09/11/2009  . UNSTEADY GAIT 09/11/2009  . Essential hypertension, benign 01/02/2009  . GERD 01/02/2009  . HEMORRHAGE OF RECTUM AND ANUS 01/02/2009  . PAIN IN JOINT, HAND 11/20/2008  . Pain in joint, pelvic region and thigh 11/20/2008  . ARTHRITIS, LEFT FOOT 07/17/2007    Hilma Favors, PT, DPT [phone] 02/03/2015, 4:28 PM  [facility] [address] [place], [place], [address] Phone: [phone]   Fax:  [phone]

## 2015-02-04 NOTE — Telephone Encounter (Signed)
   There is nothing stated in the therapy note that patient has dislocation, patient or caretaker may have misunderstood the term subluxation which would be a result of her fracture.  Called home and mobile phone multiple times to relay with no answer

## 2015-02-04 NOTE — Telephone Encounter (Signed)
Spoke with patients son and relayed and he states she did not go to ER, he spoke with therapist and was advised that she did not have dislocation

## 2015-02-05 ENCOUNTER — Ambulatory Visit (HOSPITAL_COMMUNITY): Payer: Medicare Other | Admitting: Physical Therapy

## 2015-02-07 ENCOUNTER — Ambulatory Visit (HOSPITAL_COMMUNITY): Payer: Medicare Other | Admitting: Physical Therapy

## 2015-02-07 ENCOUNTER — Telehealth (HOSPITAL_COMMUNITY): Payer: Self-pay | Admitting: Physical Therapy

## 2015-02-07 ENCOUNTER — Other Ambulatory Visit: Payer: Self-pay | Admitting: Family Medicine

## 2015-02-07 DIAGNOSIS — R2681 Unsteadiness on feet: Secondary | ICD-10-CM

## 2015-02-07 DIAGNOSIS — R531 Weakness: Secondary | ICD-10-CM

## 2015-02-07 DIAGNOSIS — I6930 Unspecified sequelae of cerebral infarction: Secondary | ICD-10-CM | POA: Diagnosis not present

## 2015-02-07 DIAGNOSIS — Z9181 History of falling: Secondary | ICD-10-CM

## 2015-02-07 DIAGNOSIS — R269 Unspecified abnormalities of gait and mobility: Secondary | ICD-10-CM

## 2015-02-07 DIAGNOSIS — R293 Abnormal posture: Secondary | ICD-10-CM

## 2015-02-07 NOTE — Telephone Encounter (Signed)
Pt's son called earlier in the day regarding concern for his mother's shoulder. PT returned phone call. Son reported that caregiver present in treatment session told him that pt's shoulder was dislocated. PT explained to son that pt demonstrated minor subluxation of her L shoulder as a result of her stroke, provided explanation of subluxation, educated son on benefit of pt wearing sling to decrease shoulder pain as a result of the subluxation and advised to reschedule the OT eval that pt was no-show for.

## 2015-02-07 NOTE — Therapy (Signed)
[place], [place], [address] Phone: [phone]   Fax:  [phone]  Physical Therapy Treatment  Patient Details  Name: Erin [name] MRN: [id number] Date of Birth: 12-09-1929 Referring Provider:  Fayrene Helper, MD  Encounter Date: 02/07/2015      PT End of Session - 02/07/15 1213    Visit Number 6   Number of Visits 20   Date for PT Re-Evaluation 02/11/15   Authorization Type Medicare/[employer]    Authorization Time Period 01/14/15 to 03/17/15   Authorization - Visit Number 6   Authorization - Number of Visits 10   PT Start Time 0930   PT Stop Time 1012   PT Time Calculation (min) 42 min   Equipment Utilized During Treatment Gait belt   Activity Tolerance Patient limited by fatigue;Patient limited by pain   Behavior During Therapy [facility] for tasks assessed/performed      Past Medical History  Diagnosis Date  . Anxiety   . Arthritis   . Hyperlipidemia   . Essential hypertension, benign   . Osteoporosis   . GERD (gastroesophageal reflux disease)   . Hearing loss   . Restless leg   . Tremor   . Leg edema   . Obese   . Gall bladder disease   . Cataract   . Carpal tunnel syndrome of left wrist   . Atrial fibrillation   . Sleep apnea   . Esophageal dilatation   . Frequent falls   . History of nuclear stress test 08/31/2012    Lexiscan cardiolite negative for ischemia  . Polyneuropathy in other diseases classified elsewhere 10/03/2013  . Cardioembolic stroke 97/67/3419  . UTI (lower urinary tract infection)   . History of stroke with current residual effects 11/20/2014  . Seizures   . Focal motor seizure disorder 12/24/2014    Left leg involvment    Past Surgical History  Procedure Laterality Date  . Appendectomy    . Cholecystectomy    . Abdominal hysterectomy    . Fracture surgery      Left arm x4  . Benign cyst removed from kidney and lung, bilateral mastectomy    . Bilateral  great toenail removal    . Knee arthroscopy  2012    Right knee, Dr Aline Brochure  . Left forearm    . Breast surgery      Bilateral 2000  . Tonsillectomy    . R hand surgery    . Bronchoscopy  02/15/2000  . Right vats.  "  . Right upper lobe wedge resection.  "  . Upper gastrointestinal endoscopy  11/25/1999    Esophagitis/ Normal proximal esophagus, stomach and duodenum  . Colonoscopy  01/17/2009    Dr. Deatra Ina : Internal hemorrhoids/Diverticula, scattered in the ascending colon/  Moderate diverticulosis ascending colon to sigmoid colon  . Esophagogastroduodenoscopy   04/23/2003    Dr. Deatra Ina: HIATAL HERNIA  . Colonoscopy  01/16/2001    Normal  . Cataract extraction, bilateral    . Esophagogastroduodenoscopy  03/20/2012    FXT:[id number] ring-LIKELY CAUSING MILD DYSPHAGIA/SMALL hiatal hernia/Multiple sessile polyps ranging between 3-53mm , path benign  . Cataract extraction    . Carpal tunnel release      Left wrist  . Transthoracic echocardiogram  06/24/2009    EF=>55%, mild assymetric LVH; LA mildly dilated; mild mitral annular calcif, borderline MVP, mild-mod MR; mild-mod TR, RV systolic pressure elevated, mild pulm HTN; AV mildly sclerotic; mild pulm  valve regurg - ordered r/t bradycardia   . Endovenous ablation saphenous vein w/ laser  01/2011    Right GSV  . Cholecystectomy    . Intramedullary (im) nail intertrochanteric Left 03/25/2014    Procedure: INTRAMEDULLARY NAIL INTERTROCHANTRIC LEFT HIP;  Surgeon: Marianna Payment, MD;  Location: [facility];  Service: Orthopedics;  Laterality: Left;    There were no vitals filed for this visit.  Visit Diagnosis:  General weakness  Abnormality of gait  At high risk for falls  Unsteadiness  Abnormal posture      Subjective Assessment - 02/07/15 0936    Subjective Pt reports that she is relatively pain-free today. She has some pain in her shoulder when she moves her arm, but when she is sitting with her arm still, it doesn't bother  her.    Currently in Pain? No/denies   Pain Score 0-No pain                         [facility] Adult PT Treatment/Exercise - 02/07/15 0001    Ambulation/Gait   Ambulation/Gait Yes   Ambulation/Gait Assistance 4: Min assist;3: Mod assist   Ambulation Distance (Feet) 30 Feet  x4   Knee/Hip Exercises: Standing   Forward Step Up 10 reps;Step Height: 4"   Other Standing Knee Exercises tap ups at 4" step to improve weightbearing on LLE   Knee/Hip Exercises: Seated   Sit to Sand 5 reps                  PT Short Term Goals - 01/14/15 1639    PT SHORT TERM GOAL #1   Title Patient will demonstrate at least 4/5 strength in bilateral lower extremities, core, and proximal musculature to improve mobiltiy and stability, reduce fall risk    Time 4   Period Weeks   Status New   PT SHORT TERM GOAL #2   Title Patient will be able to perform all bed mobility, including rolling and supine to sit/sit to supine, with supervision and no cues for sequencing or safety    Time 4   Period Weeks   Status New   PT SHORT TERM GOAL #3   Title Patient will be able to perform all functional transfers, including sit to stand and bed to [facility] transfer with 2 wheeled walker and close supervision, occasional cues for safety    Time 4   Period Weeks   Status New   PT SHORT TERM GOAL #4   Title Patient will be able to ambulate at least 218ft with 2 wheeled walker, Min guard, improved gait mechanics including improved BOS, proved posture, and only occasional unsteadiness    Time 4   Period Weeks   Status New   PT SHORT TERM GOAL #5   Title Patient and caregivers will be independent in correctly and consistently performing appropriate HEP, to be updated PRN    Time 4   Period Weeks   Status New           PT Long Term Goals - 01/14/15 1642    PT LONG TERM GOAL #1   Title Patient will be Mod(I) in bed mobility, including rolling, supine to sit/sit to supine, and lateral scooting along bed  while sitting EOB with no LOB or unsteadiness    Time 8   Period Weeks   Status New   PT LONG TERM GOAL #2   Title Patient will be able to perform all  functional transfers, including sit to stand/stand to sit and bed to chair/chair to bed transfers with LRAD and Mod(I)   Time 8   Period Weeks   Status New   PT LONG TERM GOAL #3   Title Patient will be able to ambulate at least 1070ft with LRAD and distant S, no LOB or unsteadiness throughout, reduced overall fall risk    Time 8   Period Weeks   Status New   PT LONG TERM GOAL #4   Title Patient will be able to ascend/descend at least 4 steps with U HHA and LRAD, min guard, with only occasional unsteadiness during task    Time 8   Period Weeks   Status New   PT LONG TERM GOAL #5   Title Patient to be able to tolerate at least 15 minutes of unsupported sitting with no unsteadiness or LOB during task    Time 8   Period Weeks   Status New               Plan - 02/07/15 1219    Clinical Impression Statement Pt continues to demonstrate poor safety awareness and decreased balance during gait. She requires frequent verbal cueing to maintain upright posture and for proper gait mechanics. Pt became visibly upset in today's treatment session and began crying, reporting that she felt depressed that she had to rely on others and that she "prayed that she would die."  Pt was encouraged to speak with her family regarding how she feels, and to seek out help if she continued to feel these emotions.    PT Next Visit Plan Continue with standing strengthening        Problem List Patient Active Problem List   Diagnosis Date Noted  . Focal motor seizure disorder 12/24/2014  . Hospital discharge follow-up 12/21/2014  . Medicare annual wellness visit, subsequent 11/28/2014  . Seasonal allergies 11/28/2014  . History of stroke with current residual effects 11/20/2014  . Atrial fibrillation 07/14/2014  . Sleep apnea 07/14/2014  . Senile  dementia with behavioral disturbance 07/14/2014  . Moderate tricuspid regurgitation 07/14/2014  . Left arm weakness 07/14/2014  . Hyperlipidemia LDL goal <70 07/08/2014  . Cough 05/29/2014  . Cardioembolic stroke 44/81/8563  . Anxiety state 05/01/2014  . CVA (cerebral infarction) 03/28/2014  . Cerebral embolism with cerebral infarction 03/23/2014  . Fracture, intertrochanteric, left femur 03/20/2014  . Fall at home 03/20/2014  . Allergic rhinitis 10/15/2013  . Insomnia 10/15/2013  . Polyneuropathy in other diseases classified elsewhere 10/03/2013  . Chronic atrial fibrillation 04/09/2013  . DNR (do not resuscitate) 02/13/2013  . Hearing loss 02/13/2013  . Muscle weakness (generalized) 11/24/2012  . Left shoulder pain 11/13/2012  . Unspecified constipation 09/05/2012  . FH: colon cancer 02/23/2012  . Memory loss 11/08/2011  . Other vitamin B12 deficiency anemia 11/08/2011  . Essential and other specified forms of tremor 11/08/2011  . Osteoporosis, senile   . Prediabetes 03/16/2011  . SPINAL STENOSIS, LUMBAR 11/20/2009  . SCIATICA 11/13/2009  . OSTEOARTHRITIS, KNEE, RIGHT 09/11/2009  . UNSTEADY GAIT 09/11/2009  . Essential hypertension, benign 01/02/2009  . GERD 01/02/2009  . HEMORRHAGE OF RECTUM AND ANUS 01/02/2009  . PAIN IN JOINT, HAND 11/20/2008  . Pain in joint, pelvic region and thigh 11/20/2008  . ARTHRITIS, LEFT FOOT 07/17/2007    Hilma Favors, PT, DPT [phone] 02/07/2015, 12:23 PM  [place] [address] [place], [place], [address] Phone: [phone]   Fax:  [phone]      

## 2015-02-10 ENCOUNTER — Ambulatory Visit (HOSPITAL_COMMUNITY): Payer: Medicare Other | Attending: Family Medicine | Admitting: Physical Therapy

## 2015-02-10 DIAGNOSIS — R531 Weakness: Secondary | ICD-10-CM | POA: Insufficient documentation

## 2015-02-10 DIAGNOSIS — I6930 Unspecified sequelae of cerebral infarction: Secondary | ICD-10-CM | POA: Diagnosis present

## 2015-02-10 DIAGNOSIS — R269 Unspecified abnormalities of gait and mobility: Secondary | ICD-10-CM | POA: Diagnosis present

## 2015-02-10 DIAGNOSIS — R293 Abnormal posture: Secondary | ICD-10-CM | POA: Insufficient documentation

## 2015-02-10 DIAGNOSIS — R296 Repeated falls: Secondary | ICD-10-CM | POA: Diagnosis present

## 2015-02-10 DIAGNOSIS — R2681 Unsteadiness on feet: Secondary | ICD-10-CM

## 2015-02-10 DIAGNOSIS — Z7409 Other reduced mobility: Secondary | ICD-10-CM | POA: Diagnosis present

## 2015-02-10 DIAGNOSIS — Z9181 History of falling: Secondary | ICD-10-CM

## 2015-02-10 NOTE — Therapy (Signed)
[place], [place], [address] Phone: [phone]   Fax:  [phone]  Physical Therapy Treatment  Patient Details  Name: Erin [name] MRN: [id number] Date of Birth: 1930/02/06 Referring Provider:  Fayrene Helper, MD  Encounter Date: 02/10/2015      PT End of Session - 02/10/15 1213    Visit Number 7   Number of Visits 20   Date for PT Re-Evaluation 02/12/15   Authorization Type Medicare/[employer]    Authorization Time Period 01/14/15 to 03/17/15   Authorization - Visit Number 7   Authorization - Number of Visits 10   PT Start Time 1100   PT Stop Time 1148   PT Time Calculation (min) 48 min   Equipment Utilized During Treatment Gait belt   Activity Tolerance Patient limited by fatigue   Behavior During Therapy [facility] for tasks assessed/performed      Past Medical History  Diagnosis Date  . Anxiety   . Arthritis   . Hyperlipidemia   . Essential hypertension, benign   . Osteoporosis   . GERD (gastroesophageal reflux disease)   . Hearing loss   . Restless leg   . Tremor   . Leg edema   . Obese   . Gall bladder disease   . Cataract   . Carpal tunnel syndrome of left wrist   . Atrial fibrillation   . Sleep apnea   . Esophageal dilatation   . Frequent falls   . History of nuclear stress test 08/31/2012    Lexiscan cardiolite negative for ischemia  . Polyneuropathy in other diseases classified elsewhere 10/03/2013  . Cardioembolic stroke 35/70/1779  . UTI (lower urinary tract infection)   . History of stroke with current residual effects 11/20/2014  . Seizures   . Focal motor seizure disorder 12/24/2014    Left leg involvment    Past Surgical History  Procedure Laterality Date  . Appendectomy    . Cholecystectomy    . Abdominal hysterectomy    . Fracture surgery      Left arm x4  . Benign cyst removed from kidney and lung, bilateral mastectomy    . Bilateral great toenail removal     . Knee arthroscopy  2012    Right knee, Dr Aline Brochure  . Left forearm    . Breast surgery      Bilateral 2000  . Tonsillectomy    . R hand surgery    . Bronchoscopy  02/15/2000  . Right vats.  "  . Right upper lobe wedge resection.  "  . Upper gastrointestinal endoscopy  11/25/1999    Esophagitis/ Normal proximal esophagus, stomach and duodenum  . Colonoscopy  01/17/2009    Dr. Deatra Ina : Internal hemorrhoids/Diverticula, scattered in the ascending colon/  Moderate diverticulosis ascending colon to sigmoid colon  . Esophagogastroduodenoscopy   04/23/2003    Dr. Deatra Ina: HIATAL HERNIA  . Colonoscopy  01/16/2001    Normal  . Cataract extraction, bilateral    . Esophagogastroduodenoscopy  03/20/2012    TJQ:[id number] ring-LIKELY CAUSING MILD DYSPHAGIA/SMALL hiatal hernia/Multiple sessile polyps ranging between 3-49mm , path benign  . Cataract extraction    . Carpal tunnel release      Left wrist  . Transthoracic echocardiogram  06/24/2009    EF=>55%, mild assymetric LVH; LA mildly dilated; mild mitral annular calcif, borderline MVP, mild-mod MR; mild-mod TR, RV systolic pressure elevated, mild pulm HTN; AV mildly sclerotic; mild pulm valve regurg -  ordered r/t bradycardia   . Endovenous ablation saphenous vein w/ laser  01/2011    Right GSV  . Cholecystectomy    . Intramedullary (im) nail intertrochanteric Left 03/25/2014    Procedure: INTRAMEDULLARY NAIL INTERTROCHANTRIC LEFT HIP;  Surgeon: Marianna Payment, MD;  Location: [facility];  Service: Orthopedics;  Laterality: Left;    There were no vitals filed for this visit.  Visit Diagnosis:  General weakness  Abnormality of gait  At high risk for falls  Unsteadiness      Subjective Assessment - 02/10/15 1106    Subjective Pt reports that she still has pain in her shoulder whenever she moves her arm. She did some walking over the weekend, but she feels pretty good today, like she has a little more energy.    Currently in Pain?  No/denies   Pain Score 0-No pain                         [facility] Adult PT Treatment/Exercise - 02/10/15 0001    Ambulation/Gait   Ambulation/Gait Yes   Ambulation/Gait Assistance 3: Mod assist   Ambulation Distance (Feet) 20 Feet  x3   Knee/Hip Exercises: Standing   Heel Raises 10 reps   Hip Flexion 10 reps   Hip Flexion Limitations standing march   Forward Step Up 10 reps  tap up at 4" step                  PT Short Term Goals - 01/14/15 1639    PT SHORT TERM GOAL #1   Title Patient will demonstrate at least 4/5 strength in bilateral lower extremities, core, and proximal musculature to improve mobiltiy and stability, reduce fall risk    Time 4   Period Weeks   Status New   PT SHORT TERM GOAL #2   Title Patient will be able to perform all bed mobility, including rolling and supine to sit/sit to supine, with supervision and no cues for sequencing or safety    Time 4   Period Weeks   Status New   PT SHORT TERM GOAL #3   Title Patient will be able to perform all functional transfers, including sit to stand and bed to [facility] transfer with 2 wheeled walker and close supervision, occasional cues for safety    Time 4   Period Weeks   Status New   PT SHORT TERM GOAL #4   Title Patient will be able to ambulate at least 236ft with 2 wheeled walker, Min guard, improved gait mechanics including improved BOS, proved posture, and only occasional unsteadiness    Time 4   Period Weeks   Status New   PT SHORT TERM GOAL #5   Title Patient and caregivers will be independent in correctly and consistently performing appropriate HEP, to be updated PRN    Time 4   Period Weeks   Status New           PT Long Term Goals - 01/14/15 1642    PT LONG TERM GOAL #1   Title Patient will be Mod(I) in bed mobility, including rolling, supine to sit/sit to supine, and lateral scooting along bed while sitting EOB with no LOB or unsteadiness    Time 8   Period Weeks   Status  New   PT LONG TERM GOAL #2   Title Patient will be able to perform all functional transfers, including sit to stand/stand to sit and bed to  chair/chair to bed transfers with LRAD and Mod(I)   Time 8   Period Weeks   Status New   PT LONG TERM GOAL #3   Title Patient will be able to ambulate at least 1024ft with LRAD and distant S, no LOB or unsteadiness throughout, reduced overall fall risk    Time 8   Period Weeks   Status New   PT LONG TERM GOAL #4   Title Patient will be able to ascend/descend at least 4 steps with U HHA and LRAD, min guard, with only occasional unsteadiness during task    Time 8   Period Weeks   Status New   PT LONG TERM GOAL #5   Title Patient to be able to tolerate at least 15 minutes of unsupported sitting with no unsteadiness or LOB during task    Time 8   Period Weeks   Status New               Plan - 02/10/15 1214    Clinical Impression Statement Pt demonstrated poor safety awareness during gait training today, experiencing several episodes of LOB that required min-max A to correct. She continues to demonstrate L trunk lean as well as retropulsion when ambulating. Pt required mod A during standing strengthening exercises in order to maintain upright posture.    PT Next Visit Plan Reassess next visit        Problem List Patient Active Problem List   Diagnosis Date Noted  . Focal motor seizure disorder 12/24/2014  . Hospital discharge follow-up 12/21/2014  . Medicare annual wellness visit, subsequent 11/28/2014  . Seasonal allergies 11/28/2014  . History of stroke with current residual effects 11/20/2014  . Atrial fibrillation 07/14/2014  . Sleep apnea 07/14/2014  . Senile dementia with behavioral disturbance 07/14/2014  . Moderate tricuspid regurgitation 07/14/2014  . Left arm weakness 07/14/2014  . Hyperlipidemia LDL goal <70 07/08/2014  . Cough 05/29/2014  . Cardioembolic stroke 82/70/7867  . Anxiety state 05/01/2014  . CVA (cerebral  infarction) 03/28/2014  . Cerebral embolism with cerebral infarction 03/23/2014  . Fracture, intertrochanteric, left femur 03/20/2014  . Fall at home 03/20/2014  . Allergic rhinitis 10/15/2013  . Insomnia 10/15/2013  . Polyneuropathy in other diseases classified elsewhere 10/03/2013  . Chronic atrial fibrillation 04/09/2013  . DNR (do not resuscitate) 02/13/2013  . Hearing loss 02/13/2013  . Muscle weakness (generalized) 11/24/2012  . Left shoulder pain 11/13/2012  . Unspecified constipation 09/05/2012  . FH: colon cancer 02/23/2012  . Memory loss 11/08/2011  . Other vitamin B12 deficiency anemia 11/08/2011  . Essential and other specified forms of tremor 11/08/2011  . Osteoporosis, senile   . Prediabetes 03/16/2011  . SPINAL STENOSIS, LUMBAR 11/20/2009  . SCIATICA 11/13/2009  . OSTEOARTHRITIS, KNEE, RIGHT 09/11/2009  . UNSTEADY GAIT 09/11/2009  . Essential hypertension, benign 01/02/2009  . GERD 01/02/2009  . HEMORRHAGE OF RECTUM AND ANUS 01/02/2009  . PAIN IN JOINT, HAND 11/20/2008  . Pain in joint, pelvic region and thigh 11/20/2008  . ARTHRITIS, LEFT FOOT 07/17/2007    Forde Dandy 02/10/2015, 12:17 PM  [facility] [address] [place], [place], [address] Phone: [phone]   Fax:  [phone]

## 2015-02-12 ENCOUNTER — Ambulatory Visit (HOSPITAL_COMMUNITY): Payer: Medicare Other | Admitting: Physical Therapy

## 2015-02-13 ENCOUNTER — Ambulatory Visit (INDEPENDENT_AMBULATORY_CARE_PROVIDER_SITE_OTHER): Payer: Medicare Other | Admitting: Orthopedic Surgery

## 2015-02-13 ENCOUNTER — Encounter: Payer: Self-pay | Admitting: Orthopedic Surgery

## 2015-02-13 VITALS — BP 124/75 | Ht 66.0 in | Wt 123.0 lb

## 2015-02-13 DIAGNOSIS — S42202D Unspecified fracture of upper end of left humerus, subsequent encounter for fracture with routine healing: Secondary | ICD-10-CM

## 2015-02-13 DIAGNOSIS — I639 Cerebral infarction, unspecified: Secondary | ICD-10-CM

## 2015-02-13 MED ORDER — HYDROCODONE-ACETAMINOPHEN 5-325 MG PO TABS: 1.0000 | ORAL_TABLET | Freq: Four times a day (QID) | ORAL | Status: DC | PRN

## 2015-02-13 NOTE — Progress Notes (Signed)
Patient ID: Erin [name], female   DOB: [birth year], 81 y.o.   MRN: [id number]  Follow up visit  Chief Complaint  Patient presents with  . Follow-up    2 month follow up left humerus fracture DOI 09/25/14    BP 124/75 mmHg  Ht 5\' 6"  (1.676 m)  Wt 123 lb (55.792 kg)  BMI 19.86 kg/m2  Encounter Diagnosis  Name Primary?  . Proximal humerus fracture, left, with routine healing, subsequent encounter Yes     left proximal humerus fracture. The patient says that the therapist have told her that her shoulder is dislocated. She never had a dislocation she had a proximal humerus fracture I re- viewed her previous x-rays and she had a nondisplaced proximal humerus fracture with slight angulation which was nonoperative by Neer criteria. I think she should have some physical therapy on the left shoulder as her passive range of motion though noted to have some crepitance was 110 flexion.  Follow-up with [place] as needed okay to take hydrocodone for pain  PT OT set up.

## 2015-02-14 ENCOUNTER — Ambulatory Visit (HOSPITAL_COMMUNITY): Payer: Medicare Other | Admitting: Physical Therapy

## 2015-02-14 ENCOUNTER — Telehealth (HOSPITAL_COMMUNITY): Payer: Self-pay | Admitting: Physical Therapy

## 2015-02-14 DIAGNOSIS — R531 Weakness: Secondary | ICD-10-CM | POA: Diagnosis not present

## 2015-02-14 DIAGNOSIS — R2681 Unsteadiness on feet: Secondary | ICD-10-CM

## 2015-02-14 DIAGNOSIS — R269 Unspecified abnormalities of gait and mobility: Secondary | ICD-10-CM

## 2015-02-14 DIAGNOSIS — Z7409 Other reduced mobility: Secondary | ICD-10-CM

## 2015-02-14 DIAGNOSIS — Z9181 History of falling: Secondary | ICD-10-CM

## 2015-02-14 DIAGNOSIS — I6930 Unspecified sequelae of cerebral infarction: Secondary | ICD-10-CM

## 2015-02-14 DIAGNOSIS — R293 Abnormal posture: Secondary | ICD-10-CM

## 2015-02-14 NOTE — Telephone Encounter (Signed)
Called and attempted to speak to patient's son, Erin [name], about patient status and some concerns that have been raised individually by both the patient and caregivers. Erin [name] was not available at this time; requested that he please give the clinic a call back this afternoon.  Deniece Ree PT, DPT [phone]

## 2015-02-14 NOTE — Telephone Encounter (Signed)
Returned son's phone call. Discussed patient's current status in PT as well as plan of care to continue at 1x/week until HHPT is established as HHPT is most appropriate for her at this time. Son in agreement.  Also discussed patient's comments about her caregivers, including comments about being frustrated regarding not being put in clean clothes this morning, patient's perception that caregiver doesn't do a lot with her, etc. Also advised patient that PT had noticed that patient did have dirty clothes on today as well as nails cut on one hand but not the other. Advised the son that from PT perspective caregiver seems to be doing OK job, cannot speak for how things are going at home, and educated patient that PT is professionally obligated to notify family about patient reported issues like this regardless.   Also discussed caregiver's perspective, as she had reported to PT that patient has been on a decline recently and has needed more assistance with mobility, has needed more help performing tasks/dressing/bathing, has had more mood swings, has wanted to be active but is just extremely fatigued after PT, etc. Son is in agreement with this, and states that he has also noticed her being on a functional downslide and having more changes in mood recently.  Son very pleasant during phone call and in agreement with plan of care moving forward at this time.   Deniece Ree PT, DPT [phone]

## 2015-02-14 NOTE — Therapy (Signed)
[place], [place], [address] Phone: [phone]   Fax:  [phone]  Physical Therapy Treatment (Re-Assessment)  Patient Details  Name: Erin [name] MRN: [id number] Date of Birth: August 19, 1929 Referring Provider:  Fayrene Helper, MD  Encounter Date: 02/14/2015      PT End of Session - 02/14/15 1233    Visit Number 8   Number of Visits 20   Date for PT Re-Evaluation 03/14/15   Authorization Type Medicare/[employer] (G-codes done 8th session)   Authorization Time Period 01/14/15 to 03/17/15   Authorization - Visit Number 8   Authorization - Number of Visits 18   PT Start Time 3557   PT Stop Time 1150   PT Time Calculation (min) 45 min   Equipment Utilized During Treatment Gait belt   Activity Tolerance Patient limited by fatigue;Patient limited by pain   Behavior During Therapy [facility] for tasks assessed/performed      Past Medical History  Diagnosis Date  . Anxiety   . Arthritis   . Hyperlipidemia   . Essential hypertension, benign   . Osteoporosis   . GERD (gastroesophageal reflux disease)   . Hearing loss   . Restless leg   . Tremor   . Leg edema   . Obese   . Gall bladder disease   . Cataract   . Carpal tunnel syndrome of left wrist   . Atrial fibrillation   . Sleep apnea   . Esophageal dilatation   . Frequent falls   . History of nuclear stress test 08/31/2012    Lexiscan cardiolite negative for ischemia  . Polyneuropathy in other diseases classified elsewhere 10/03/2013  . Cardioembolic stroke 32/20/2542  . UTI (lower urinary tract infection)   . History of stroke with current residual effects 11/20/2014  . Seizures   . Focal motor seizure disorder 12/24/2014    Left leg involvment    Past Surgical History  Procedure Laterality Date  . Appendectomy    . Cholecystectomy    . Abdominal hysterectomy    . Fracture surgery      Left arm x4  . Benign cyst removed from kidney and  lung, bilateral mastectomy    . Bilateral great toenail removal    . Knee arthroscopy  2012    Right knee, Dr Aline Brochure  . Left forearm    . Breast surgery      Bilateral 2000  . Tonsillectomy    . R hand surgery    . Bronchoscopy  02/15/2000  . Right vats.  "  . Right upper lobe wedge resection.  "  . Upper gastrointestinal endoscopy  11/25/1999    Esophagitis/ Normal proximal esophagus, stomach and duodenum  . Colonoscopy  01/17/2009    Dr. Deatra Ina : Internal hemorrhoids/Diverticula, scattered in the ascending colon/  Moderate diverticulosis ascending colon to sigmoid colon  . Esophagogastroduodenoscopy   04/23/2003    Dr. Deatra Ina: HIATAL HERNIA  . Colonoscopy  01/16/2001    Normal  . Cataract extraction, bilateral    . Esophagogastroduodenoscopy  03/20/2012    [id number] ring-LIKELY CAUSING MILD DYSPHAGIA/SMALL hiatal hernia/Multiple sessile polyps ranging between 3-67mm , path benign  . Cataract extraction    . Carpal tunnel release      Left wrist  . Transthoracic echocardiogram  06/24/2009    EF=>55%, mild assymetric LVH; LA mildly dilated; mild mitral annular calcif, borderline MVP, mild-mod MR; mild-mod TR, RV systolic pressure elevated, mild pulm HTN; AV  mildly sclerotic; mild pulm valve regurg - ordered r/t bradycardia   . Endovenous ablation saphenous vein w/ laser  01/2011    Right GSV  . Cholecystectomy    . Intramedullary (im) nail intertrochanteric Left 03/25/2014    Procedure: INTRAMEDULLARY NAIL INTERTROCHANTRIC LEFT HIP;  Surgeon: Marianna Payment, MD;  Location: [facility];  Service: Orthopedics;  Laterality: Left;    There were no vitals filed for this visit.  Visit Diagnosis:  General weakness  Abnormality of gait  At high risk for falls  Unsteadiness  Abnormal posture  History of stroke with residual effects  Impaired functional mobility, balance, gait, and endurance      Subjective Assessment - 02/14/15 1226    Subjective Patient arrives with  caregivers, who report that she has a new pain along her L ribs that they think may be from the pressure they need to use to hold her up. Caregivers continue to emphasize that pateint is VERY fatigued after PT sessions.    Patient is accompained by: Family member   Pertinent History September 2015; patient was at [facility] center from October to December of last year. Also had HHPT. Patient recently had an injury to her shoulder at home.    Currently in Pain? No/denies                         [facility] Adult PT Treatment/Exercise - 02/14/15 0001    Ambulation/Gait   Ambulation/Gait Yes   Ambulation/Gait Assistance Other (comment)  Min(A) x2   Ambulation Distance (Feet) 30 Feet   Gait Comments gait with rollwing walker, Min(A)x2 with support for painful L UE, mirror and tacile/verbal cues for posture, form safety    Knee/Hip Exercises: Standing   Other Standing Knee Exercises Postral training using mirror and tactile cues and used tech to support painful L UE during gait                  PT Education - 02/14/15 1232    Education provided Yes   Education Details patient and caretaker education regarding plano f care moving forward;    Person(s) Educated Patient;Caregiver(s)   Methods Explanation   Comprehension Verbalized understanding          PT Short Term Goals - 01/14/15 1639    PT SHORT TERM GOAL #1   Title Patient will demonstrate at least 4/5 strength in bilateral lower extremities, core, and proximal musculature to improve mobiltiy and stability, reduce fall risk    Time 4   Period Weeks   Status New   PT SHORT TERM GOAL #2   Title Patient will be able to perform all bed mobility, including rolling and supine to sit/sit to supine, with supervision and no cues for sequencing or safety    Time 4   Period Weeks   Status New   PT SHORT TERM GOAL #3   Title Patient will be able to perform all functional transfers, including sit to stand and bed to [facility] transfer  with 2 wheeled walker and close supervision, occasional cues for safety    Time 4   Period Weeks   Status New   PT SHORT TERM GOAL #4   Title Patient will be able to ambulate at least 26ft with 2 wheeled walker, Min guard, improved gait mechanics including improved BOS, proved posture, and only occasional unsteadiness    Time 4   Period Weeks   Status New   PT SHORT  TERM GOAL #5   Title Patient and caregivers will be independent in correctly and consistently performing appropriate HEP, to be updated PRN    Time 4   Period Weeks   Status New           PT Long Term Goals - 01/14/15 1642    PT LONG TERM GOAL #1   Title Patient will be Mod(I) in bed mobility, including rolling, supine to sit/sit to supine, and lateral scooting along bed while sitting EOB with no LOB or unsteadiness    Time 8   Period Weeks   Status New   PT LONG TERM GOAL #2   Title Patient will be able to perform all functional transfers, including sit to stand/stand to sit and bed to chair/chair to bed transfers with LRAD and Mod(I)   Time 8   Period Weeks   Status New   PT LONG TERM GOAL #3   Title Patient will be able to ambulate at least 1053ft with LRAD and distant S, no LOB or unsteadiness throughout, reduced overall fall risk    Time 8   Period Weeks   Status New   PT LONG TERM GOAL #4   Title Patient will be able to ascend/descend at least 4 steps with U HHA and LRAD, min guard, with only occasional unsteadiness during task    Time 8   Period Weeks   Status New   PT LONG TERM GOAL #5   Title Patient to be able to tolerate at least 15 minutes of unsupported sitting with no unsteadiness or LOB during task    Time 8   Period Weeks   Status New               Plan - 02/14/15 1234    Clinical Impression Statement Re-assessment performed today. Patient continues to demonstrate generalized weakness, significant and severe postural and gait abnormalities, pain, severe balance deficits, poor  safety awareness, very poor functional activity tolerance, and reduced ability to safely participate in functional tasks and activities at this time. During this session the patient stated she was mad because she was not put in clean clothes this morning, and that she feels like her caregiver does not do her exercises with her. However the caregiver reports that she has noticed at home that while the patient does want to do things, she is requiring higher levels of assistance and appears to be backsliding as it is harder for her to do many things. Advised both patient and caregiver that HHPT continues to be most appropriate setting for patient and that PT will continue to follow up with MD regarding home health order. At this time recommend continuing skilled PT at 1x/week until  HHPT can be established.    Pt will benefit from skilled therapeutic intervention in order to improve on the following deficits Abnormal gait;Decreased endurance;Impaired tone;Decreased activity tolerance;Decreased knowledge of use of DME;Decreased strength;Pain;Impaired UE functional use;Decreased balance;Decreased mobility;Difficulty walking;Impaired perceived functional ability;Improper body mechanics;Decreased coordination;Decreased safety awareness;Impaired flexibility;Postural dysfunction   Rehab Potential Fair   PT Frequency 1x / week   PT Duration 4 weeks   PT Treatment/Interventions ADLs/Self Care Home Management;Biofeedback;Electrical Stimulation;DME Instruction;Gait training;Stair training;Functional mobility training;Therapeutic activities;Therapeutic exercise;Balance training;Neuromuscular re-education;Patient/family education;Manual techniques;Energy conservation;Taping   PT Next Visit Plan Postural and gait training with mirror    PT Home Exercise Plan given    Consulted and Agree with Plan of Care Patient;Family member/caregiver   Family Member Consulted family and home aide  G-Codes - 02/14/15 1305     Functional Assessment Tool Used Based on skilled clinical assessment of strength, functional mobility, balance, gait, posture, functional safety    Functional Limitation Mobility: Walking and moving around   Mobility: Walking and Moving Around Current Status [id number]) At least 80 percent but less than 100 percent impaired, limited or restricted   Mobility: Walking and Moving Around Goal Status [phone]) At least 60 percent but less than 80 percent impaired, limited or restricted      Problem List Patient Active Problem List   Diagnosis Date Noted  . Focal motor seizure disorder 12/24/2014  . Hospital discharge follow-up 12/21/2014  . Medicare annual wellness visit, subsequent 11/28/2014  . Seasonal allergies 11/28/2014  . History of stroke with current residual effects 11/20/2014  . Atrial fibrillation 07/14/2014  . Sleep apnea 07/14/2014  . Senile dementia with behavioral disturbance 07/14/2014  . Moderate tricuspid regurgitation 07/14/2014  . Left arm weakness 07/14/2014  . Hyperlipidemia LDL goal <70 07/08/2014  . Cough 05/29/2014  . Cardioembolic stroke 56/38/9373  . Anxiety state 05/01/2014  . CVA (cerebral infarction) 03/28/2014  . Cerebral embolism with cerebral infarction 03/23/2014  . Fracture, intertrochanteric, left femur 03/20/2014  . Fall at home 03/20/2014  . Allergic rhinitis 10/15/2013  . Insomnia 10/15/2013  . Polyneuropathy in other diseases classified elsewhere 10/03/2013  . Chronic atrial fibrillation 04/09/2013  . DNR (do not resuscitate) 02/13/2013  . Hearing loss 02/13/2013  . Muscle weakness (generalized) 11/24/2012  . Left shoulder pain 11/13/2012  . Unspecified constipation 09/05/2012  . FH: colon cancer 02/23/2012  . Memory loss 11/08/2011  . Other vitamin B12 deficiency anemia 11/08/2011  . Essential and other specified forms of tremor 11/08/2011  . Osteoporosis, senile   . Prediabetes 03/16/2011  . SPINAL STENOSIS, LUMBAR 11/20/2009  . SCIATICA  11/13/2009  . OSTEOARTHRITIS, KNEE, RIGHT 09/11/2009  . UNSTEADY GAIT 09/11/2009  . Essential hypertension, benign 01/02/2009  . GERD 01/02/2009  . HEMORRHAGE OF RECTUM AND ANUS 01/02/2009  . PAIN IN JOINT, HAND 11/20/2008  . Pain in joint, pelvic region and thigh 11/20/2008  . ARTHRITIS, LEFT FOOT 07/17/2007    Physical Therapy Progress Note  Dates of Reporting Period: 01/14/15 to 02/14/15  Objective Reports of Subjective Statement: see above   Objective Measurements: continues to demonstrate severe balance/postural/safety/gait impairments, generalized gross weakness   Goal Update: see above   Plan: see above   Reason Skilled Services are Required: postural, balance, gait, safety training 1x/week until HHPT program established with MD approval     Deniece Ree PT, DPT [place], [place], [address] Phone: [phone]   Fax:  [phone]

## 2015-02-17 ENCOUNTER — Other Ambulatory Visit: Payer: Self-pay

## 2015-02-17 ENCOUNTER — Ambulatory Visit (INDEPENDENT_AMBULATORY_CARE_PROVIDER_SITE_OTHER): Payer: Medicare Other | Admitting: Family Medicine

## 2015-02-17 ENCOUNTER — Encounter: Payer: Self-pay | Admitting: Family Medicine

## 2015-02-17 VITALS — BP 122/74 | HR 76 | Resp 18 | Ht 65.0 in | Wt 130.0 lb

## 2015-02-17 DIAGNOSIS — E785 Hyperlipidemia, unspecified: Secondary | ICD-10-CM

## 2015-02-17 DIAGNOSIS — R7309 Other abnormal glucose: Secondary | ICD-10-CM

## 2015-02-17 DIAGNOSIS — I10 Essential (primary) hypertension: Secondary | ICD-10-CM

## 2015-02-17 DIAGNOSIS — R269 Unspecified abnormalities of gait and mobility: Secondary | ICD-10-CM

## 2015-02-17 DIAGNOSIS — F0391 Unspecified dementia with behavioral disturbance: Secondary | ICD-10-CM

## 2015-02-17 DIAGNOSIS — F411 Generalized anxiety disorder: Secondary | ICD-10-CM

## 2015-02-17 DIAGNOSIS — J302 Other seasonal allergic rhinitis: Secondary | ICD-10-CM

## 2015-02-17 DIAGNOSIS — I639 Cerebral infarction, unspecified: Secondary | ICD-10-CM | POA: Diagnosis not present

## 2015-02-17 DIAGNOSIS — G40109 Localization-related (focal) (partial) symptomatic epilepsy and epileptic syndromes with simple partial seizures, not intractable, without status epilepticus: Secondary | ICD-10-CM

## 2015-02-17 DIAGNOSIS — E559 Vitamin D deficiency, unspecified: Secondary | ICD-10-CM

## 2015-02-17 DIAGNOSIS — F03918 Unspecified dementia, unspecified severity, with other behavioral disturbance: Secondary | ICD-10-CM

## 2015-02-17 DIAGNOSIS — Z9181 History of falling: Secondary | ICD-10-CM

## 2015-02-17 DIAGNOSIS — R7303 Prediabetes: Secondary | ICD-10-CM

## 2015-02-17 DIAGNOSIS — R296 Repeated falls: Secondary | ICD-10-CM

## 2015-02-17 DIAGNOSIS — K219 Gastro-esophageal reflux disease without esophagitis: Secondary | ICD-10-CM

## 2015-02-17 DIAGNOSIS — R0789 Other chest pain: Secondary | ICD-10-CM | POA: Diagnosis not present

## 2015-02-17 DIAGNOSIS — G4700 Insomnia, unspecified: Secondary | ICD-10-CM

## 2015-02-17 MED ORDER — DONEPEZIL HCL 10 MG PO TABS: 10.0000 mg | ORAL_TABLET | Freq: Every day | ORAL | Status: DC

## 2015-02-17 NOTE — Patient Instructions (Addendum)
F/u in 3 months with MMSE, call if you need me before  You are referred for in home PT twice weekly for 6 weeks , for left arm pain and weakness ,and also for lower extremity weakness and unsteady gait  You will have a commode elevator ordered so less pain getting off commode  CXR today to look at ribs where you hurt  Fasting labs in the next 2 weeks through home  Health  You are certainly stronger and better nourished that 6 months ago, and you are enjoying love from famliy  Aricept dose is increased to standard treating dose 10 mg once daily

## 2015-02-17 NOTE — Assessment & Plan Note (Addendum)
1 week h/o left chest pain esp when raising her from commode, will obtain CXR, also order commode elevator and in home PT, difficult getting pt safely out of her home, and shje is noted to become extremely tired also

## 2015-02-18 ENCOUNTER — Other Ambulatory Visit: Payer: Self-pay

## 2015-02-18 ENCOUNTER — Ambulatory Visit (HOSPITAL_COMMUNITY): Payer: Self-pay | Admitting: Physical Therapy

## 2015-02-18 ENCOUNTER — Ambulatory Visit (HOSPITAL_COMMUNITY)
Admission: RE | Admit: 2015-02-18 | Discharge: 2015-02-18 | Disposition: A | Discharge status: Home / Self Care | Payer: Medicare Other | Source: Ambulatory Visit | Attending: Family Medicine | Admitting: Family Medicine

## 2015-02-18 DIAGNOSIS — R0789 Other chest pain: Secondary | ICD-10-CM

## 2015-02-18 DIAGNOSIS — I10 Essential (primary) hypertension: Secondary | ICD-10-CM

## 2015-02-18 DIAGNOSIS — J90 Pleural effusion, not elsewhere classified: Secondary | ICD-10-CM | POA: Diagnosis not present

## 2015-02-18 LAB — TSH: TSH: 2.213 u[IU]/mL (ref 0.350–4.500)

## 2015-02-19 LAB — LIPID PANEL
Cholesterol: 146 mg/dL (ref 125–200)
HDL: 52 mg/dL (ref >=46)
LDL Cholesterol: 79 mg/dL (ref <130)
Total CHOL/HDL Ratio: 2.8 Ratio (ref <=5.0)
Triglycerides: 74 mg/dL (ref <150)
VLDL: 15 mg/dL (ref <30)

## 2015-02-19 LAB — BASIC METABOLIC PANEL
BUN: 19 mg/dL (ref 7–25)
CALCIUM: 9.1 mg/dL (ref 8.6–10.4)
CHLORIDE: 104 mmol/L (ref 98–110)
CO2: 30 mmol/L (ref 20–31)
Creat: 0.57 mg/dL — ABNORMAL LOW (ref 0.60–0.88)
GLUCOSE: 98 mg/dL (ref 65–99)
POTASSIUM: 5.1 mmol/L (ref 3.5–5.3)
Sodium: 144 mmol/L (ref 135–146)

## 2015-02-19 LAB — VITAMIN D 25 HYDROXY (VIT D DEFICIENCY, FRACTURES): Vit D, 25-Hydroxy: 27 ng/mL — ABNORMAL LOW (ref 30–100)

## 2015-02-20 ENCOUNTER — Telehealth: Payer: Self-pay | Admitting: Family Medicine

## 2015-02-20 ENCOUNTER — Ambulatory Visit (HOSPITAL_COMMUNITY): Payer: Medicare Other | Admitting: Physical Therapy

## 2015-02-20 NOTE — Telephone Encounter (Signed)
Patients son [name] is calling asking for Lab and Xray Results for Ms. Leiterman, please advise?

## 2015-02-21 ENCOUNTER — Other Ambulatory Visit: Payer: Self-pay | Admitting: Licensed Clinical Social Worker

## 2015-02-21 NOTE — Patient Outreach (Signed)
Assessment: CSW called home phone number of client on 02/21/15. Client was not able to come to the phone but client asked for her home health aide, Caryl Pina, to speak with CSW on 02/21/15 about needs of client.  CSW verified with Caryl Pina that Caryl Pina was home health aide for client hired by client and family to assist client in the home.  Ashely said she helps client as scheduled as home health aide as arranged by family.  Caryl Pina said she helps client about 18 hours per week.  Caryl Pina said client  now needs more care with activities of daily living.  Client is eating well.  Client is sleeping well.  Client has her prescribed medications.  Caryl Pina said that client did not seem sad or depressed.  Client is getting along well with family members at home of son Franci Oshana.  [name] does work during the day.  Gaynelle Adu did have appointment with [name], primary doctor, this week.  Visit with [name] went well.  Caryl Pina reported that [name] had ordered in home physical therapy for 6 weeks for client related to left arm usage of client and walking assistance for client.  Caryl Pina said that client will enjoy in home physical therapy sessions. Client is not incontinent.  Her weight has increased slightly.  Caryl Pina said client uses boost supplement as needed.  Client has pain in her left side and has talked with [name] about this medical issue.  Caryl Pina said client has appointment with neurologist in [place] in September of 2016.  Client's grandson helps transport client to and from scheduled appointments.  Grandson of client also lives at home of Alfretta Pinch.   CSW gave [facility] CSW phone number to Caryl Pina and asked Caryl Pina to give CSW number to [place] and Natelie Ostrosky if either needed to call CSW related to social work needs of client .  CSW thanked [place] for phone conversation on 02/21/15.   Plan: Caryl Pina, home health aide for client, to give CSW phone number [phone]) to [facility] and Kaithlyn Teagle in  order that either may call CSW as needed. Client to take medications as prescribed and attend scheduled medical appointments. CSW to call client in 4 weeks to assess needs of client at that time. CSW to collaborate with RN [name] in monitoring needs of client.  [name].Verdon Ferrante MSW, LCSW Licensed Clinical Social Worker [facility] Care Management [phone]

## 2015-02-24 ENCOUNTER — Other Ambulatory Visit: Payer: Self-pay | Admitting: Family Medicine

## 2015-02-24 DIAGNOSIS — J90 Pleural effusion, not elsewhere classified: Secondary | ICD-10-CM

## 2015-02-24 DIAGNOSIS — J8410 Pulmonary fibrosis, unspecified: Secondary | ICD-10-CM

## 2015-02-24 DIAGNOSIS — R918 Other nonspecific abnormal finding of lung field: Secondary | ICD-10-CM

## 2015-02-24 MED ORDER — VITAMIN D (ERGOCALCIFEROL) 1.25 MG (50000 UNIT) PO CAPS: 50000.0000 [IU] | ORAL_CAPSULE | ORAL | Status: DC

## 2015-02-24 NOTE — Telephone Encounter (Signed)
Erin [name] aware of results

## 2015-02-25 ENCOUNTER — Ambulatory Visit (HOSPITAL_COMMUNITY): Payer: Self-pay

## 2015-02-26 ENCOUNTER — Ambulatory Visit (HOSPITAL_COMMUNITY): Payer: Medicare Other | Admitting: Specialist

## 2015-02-27 ENCOUNTER — Ambulatory Visit (HOSPITAL_COMMUNITY): Payer: Self-pay

## 2015-02-27 ENCOUNTER — Other Ambulatory Visit: Payer: Self-pay | Admitting: *Deleted

## 2015-02-28 ENCOUNTER — Ambulatory Visit: Payer: Self-pay | Admitting: Urology

## 2015-02-28 ENCOUNTER — Other Ambulatory Visit: Payer: Self-pay | Admitting: Orthopedic Surgery

## 2015-02-28 NOTE — Patient Outreach (Signed)
[facility]) Care Management   02/28/2015  Ashika Apuzzo Brizzi [birth year] [id number]  JOSELINE MCCAMPBELL is an 81 y.o. female  Subjective: Mrs. Ensz is an 81 year old lady who has a history of HTN, afib, and cardioembolic stroke in September of 2015. [facility] Management has been following Mrs. Mcelhaney in the community off and on for > 2 years for management of HTN. Unfortunately, she suffered another significant stroke in January and fell, sustaining a left femur fracture. She was subsequently hospitalized and spent some time at [facility] for rehabilitation after her hospitalization. Mrs. Lambe discharged to her home where her adult son [name] lived with her. Mrs. Beamon did not do well in this setting, unfortunately suffering several falls and a shoulder fracture. She is now and plans to live with her very supportive son [name] indefinitely. Mrs. Marmolejos is still very deconditioned but improving. She has made one trip to outpatient PT and is requesting continuation of HHPT.   Objective:  BP 138/78 mmHg  Pulse 59  SpO2 98%  Review of Systems  Constitutional: Negative.   Eyes: Negative.   Respiratory: Negative.   Cardiovascular: Negative.   Genitourinary: Negative.   Musculoskeletal: Positive for myalgias, back pain, falls and neck pain.  Skin: Negative.   Neurological: Positive for focal weakness.       Left sided weakness subsequent to CVA  Psychiatric/Behavioral: Negative.     Physical Exam  Constitutional: She is oriented to person, place, and time. Vital signs are normal. She appears well-developed and well-nourished. She is active.  Cardiovascular: Normal rate.   Respiratory: Effort normal and breath sounds normal.  GI: Soft. Bowel sounds are normal.  Musculoskeletal:       Left shoulder: She exhibits decreased range of motion and decreased strength.       Left elbow: She exhibits decreased range of motion.       Left wrist: She exhibits decreased range  of motion.  Neurological: She is alert and oriented to person, place, and time.  Skin: Skin is warm, dry and intact.  Psychiatric: Her speech is normal and behavior is normal. Judgment and thought content normal. Cognition and memory are normal. She exhibits a depressed mood.  Reports feeling "down and blue" about her younger son Terri's recent arrest; states she is depending on God and her faith and her other family to help her through and says that maybe this incident will help her son "get himself straight"; denies suicidal ideation or feelings of hopelessness    Current Medications:   Current Outpatient Prescriptions  Medication Sig Dispense Refill  . acetaminophen (TYLENOL) 500 MG tablet Take 1,000 mg by mouth every 6 (six) hours as needed for mild pain or moderate pain.    [name] ALPRAZolam (XANAX) 0.5 MG tablet Take 1 tablet (0.5 mg total) by mouth at bedtime as needed for anxiety. 30 tablet 5  . apixaban (ELIQUIS) 5 MG TABS tablet Take 1 tablet (5 mg total) by mouth 2 (two) times daily. 60 tablet 6  . atorvastatin (LIPITOR) 20 MG tablet Take 1 tablet by mouth daily.    . busPIRone (BUSPAR) 5 MG tablet TAKE ONE TABLET BY MOUTH TWICE DAILY 60 tablet 0  . donepezil (ARICEPT) 10 MG tablet Take 1 tablet (10 mg total) by mouth at bedtime. 30 tablet 5  . gabapentin (NEURONTIN) 100 MG capsule One capsule in the morning and midday, 3 capsules at bedtime 150 capsule 3  . HYDROcodone-acetaminophen (NORCO/VICODIN) 5-325 MG  per tablet Take 1 tablet by mouth every 6 (six) hours as needed for moderate pain. 60 tablet 0  . levETIRAcetam (KEPPRA) 750 MG tablet Take 1 tablet (750 mg total) by mouth 2 (two) times daily. 60 tablet 1  . magnesium hydroxide (MILK OF MAGNESIA) 400 MG/5ML suspension Take by mouth daily as needed for mild constipation.    . metoprolol (LOPRESSOR) 50 MG tablet TAKE ONE-HALF TABLET BY MOUTH THREE TIMES DAILY 45 tablet 1  . mirtazapine (REMERON) 45 MG tablet Take 1 tablet (45 mg total)  by mouth at bedtime. 30 tablet 5  . montelukast (SINGULAIR) 10 MG tablet Take 1 tablet (10 mg total) by mouth at bedtime. 30 tablet 3  . omeprazole (PRILOSEC) 20 MG capsule Take 1 capsule (20 mg total) by mouth 2 (two) times daily before a meal. Take 1 capsule by mouth twice daily at 6:30am and 9:00pm. 60 capsule 3  . polyethylene glycol (MIRALAX / GLYCOLAX) packet Take 17 g by mouth daily. 14 each 0  . POTASSIUM PO Take 1 tablet by mouth daily.    . Vitamin D, Ergocalciferol, (DRISDOL) 50000 UNITS CAPS capsule Take 1 capsule (50,000 Units total) by mouth every 7 (seven) days. 4 capsule 5   No current facility-administered medications for this visit.    Functional Status:   In your present state of health, do you have any difficulty performing the following activities: 01/27/2015 12/05/2014  Hearing? N N  Vision? N N  Difficulty concentrating or making decisions? [name]  Walking or climbing stairs? Y Y  Dressing or bathing? Y Y  Doing errands, shopping? [name]  Preparing Food and eating ? - Y  Using the Toilet? - Y  In the past six months, have you accidently leaked urine? - Y  Do you have problems with loss of bowel control? - N  Managing your Medications? - Y  Managing your Finances? - Y  Housekeeping or managing your Housekeeping? - Y    Fall/Depression Screening:    PHQ 2/9 Scores 02/27/2015 01/27/2015 01/07/2015 12/05/2014 08/12/2014 07/17/2013 02/13/2013  PHQ - 2 Score 2 0 1 0 0 2 0  PHQ- 9 Score - - - - - 3 -    Assessment:   Very frail 81 year old lady who recently had a stroke and 2 fractures this year. Caregiving has been an issue and Mrs. Betton is now living indefinitely with her son [name].   Home Safety/Fall Risk - Mrs. Freeney suffered a fall last week on Thursday. Her son [name] was helping her walk and she "suddenly lost strength in her legs". [name] grasped his mother and caught her, allowing her to fall on him rather than the floor. Mrs. Hark suffered no injuries. Unfortunately,  her son suffered a back injury which is problematic for him and for Mrs. Jacko as he and his wife are both working full time and sharing responsibilities as primary hands on caregivers.   Mrs. Nez is also awaiting work from her primary care provider office re: resumption of HHPT services. She continues to ambulate with walker and assist x at least 1 person. She has a hospital bed and bedside commode.   Level of Care Concerns - Mrs.Mazo said she considered "going back to the nursing home" since her son [name] hurt his back. I spent some time explaining guidelines for Medicare coverage of SNF admission and various options for level of care needs including [place] assistance for in home care, continued pvt pay services  through [employer], and guidelines for qualification for ALF. At this time, Mrs. Mickelsen and her son agree that it is in her best interest to remain in the home of her son [name] where she is being well cared for and attended to.   Plan:  Very close surveillance by [facility] Management for oversight and assessments.  Private duty caregivers arranged by family. Awaiting [facility] opportunity for increased in home care.    [facility] CM Care Plan Problem One        Patient Outreach from 02/27/2015 in [place] Problem One  Home Safety/ Fall Risk related to stroke symptoms and deconditioning   Care Plan for Problem One  Active   [facility] Long Term Goal (31-90 days)  Over the next 60 days, patient will receive appropriate supportive care for fall risk/prevention    [facility] Long Term Goal Start Date  02/27/15   Interventions for Problem One Long Term Goal  Contacted provider office to request follow up on home health PT orders,  contacted DSS re: block grant waiting list status for PCS,  reviewed fall risk management with patient/caregivers,  provided Emmi educational materials   [facility] CM Short Term Goal #1 (0-30 days)  Over the next 30 days, patient and caregivers will  verbalize understanding of plans for ongoing physical therapy and in home care services   [facility] CM Short Term Goal #1 Start Date  02/27/15   Interventions for Short Term Goal #1  utilizing teachback method, discussed rationale for ongoing HHPT and in home care services in addition to other care options such as SNF (requiring 3 day qualifying hospital stay)   [facility] CM Short Term Goal #2 (0-30 days)  Over the next 30 days, patient and family / caregivers will verbalize understanding of fall risk precautions as outlined by PT   [facility] CM Short Term Goal #2 Start Date  02/27/15   Interventions for Short Term Goal #2  Utilizing teachback method, instructed patient/caregivers re: requesting written instructions from PT for home exercises and fall risk      [facility] Management  [phone]

## 2015-03-03 ENCOUNTER — Ambulatory Visit (HOSPITAL_COMMUNITY): Payer: Self-pay | Admitting: Physical Therapy

## 2015-03-03 ENCOUNTER — Telehealth: Payer: Self-pay | Admitting: Orthopedic Surgery

## 2015-03-03 ENCOUNTER — Other Ambulatory Visit: Payer: Self-pay | Admitting: Family Medicine

## 2015-03-03 ENCOUNTER — Encounter (HOSPITAL_COMMUNITY): Payer: Self-pay

## 2015-03-03 ENCOUNTER — Ambulatory Visit (HOSPITAL_COMMUNITY)
Admission: RE | Admit: 2015-03-03 | Discharge: 2015-03-03 | Disposition: A | Discharge status: Home / Self Care | Payer: Medicare Other | Source: Ambulatory Visit | Attending: Family Medicine | Admitting: Family Medicine

## 2015-03-03 DIAGNOSIS — J90 Pleural effusion, not elsewhere classified: Secondary | ICD-10-CM

## 2015-03-03 DIAGNOSIS — R918 Other nonspecific abnormal finding of lung field: Secondary | ICD-10-CM

## 2015-03-03 DIAGNOSIS — J8410 Pulmonary fibrosis, unspecified: Secondary | ICD-10-CM

## 2015-03-03 DIAGNOSIS — R079 Chest pain, unspecified: Secondary | ICD-10-CM | POA: Insufficient documentation

## 2015-03-03 MED ORDER — IOHEXOL 300 MG/ML  SOLN
75.0000 mL | Freq: Once | INTRAMUSCULAR | Status: AC | PRN
  Administered 2015-03-03: 75 mL via INTRAVENOUS

## 2015-03-03 NOTE — Telephone Encounter (Signed)
Patient needs to have primary care MD refill, per Dr Aline Brochure

## 2015-03-03 NOTE — Telephone Encounter (Signed)
Patient is calling requesting a refill on   magnesium hydroxide (MILK OF MAGNESIA) 400 MG/5ML suspension  Please advise?

## 2015-03-03 NOTE — Telephone Encounter (Signed)
Patient's son is aware 

## 2015-03-04 ENCOUNTER — Ambulatory Visit: Payer: Self-pay | Admitting: *Deleted

## 2015-03-04 ENCOUNTER — Other Ambulatory Visit: Payer: Self-pay | Admitting: Neurology

## 2015-03-04 ENCOUNTER — Other Ambulatory Visit: Payer: Self-pay | Admitting: Family Medicine

## 2015-03-05 ENCOUNTER — Ambulatory Visit (HOSPITAL_COMMUNITY): Payer: Self-pay

## 2015-03-06 ENCOUNTER — Other Ambulatory Visit: Payer: Self-pay

## 2015-03-07 ENCOUNTER — Telehealth: Payer: Self-pay | Admitting: Orthopedic Surgery

## 2015-03-07 ENCOUNTER — Ambulatory Visit (HOSPITAL_COMMUNITY): Payer: Self-pay | Admitting: Physical Therapy

## 2015-03-07 NOTE — Telephone Encounter (Signed)
Patient/grandson Erin [name], one of patient's designated party contacts, is with patient and has called to request refill of medication:  HYDROcodone-acetaminophen (NORCO/VICODIN) 5-325 MG per tablet [[id number]] - patient's home ph# [phone]

## 2015-03-09 ENCOUNTER — Other Ambulatory Visit: Payer: Self-pay | Admitting: Orthopedic Surgery

## 2015-03-09 NOTE — Assessment & Plan Note (Addendum)
High fall risk with weakness of upper and lower extremities and poor balance. Unable to safely leave home independently, in home PT for 6 weeks. Home safety reviewed with pt and caregivers, her grandson and a female caregiver are present

## 2015-03-09 NOTE — Assessment & Plan Note (Signed)
Controlled, no change in medication  

## 2015-03-09 NOTE — Assessment & Plan Note (Signed)
Increase aricept to standard treeting dose and rept MMSE at next visit, improvement in behavior noted

## 2015-03-09 NOTE — Assessment & Plan Note (Signed)
Controlled on current medicaton , continue same

## 2015-03-09 NOTE — Assessment & Plan Note (Signed)
chronic hemiparesis residually, but marked improvement from original presentation Committed to lifelong anticoagullation

## 2015-03-09 NOTE — Assessment & Plan Note (Signed)
Treated by neurology, denies any recent seizure activity on kepra

## 2015-03-09 NOTE — Progress Notes (Signed)
   Subjective:    Patient ID: Erin [name], female    DOB: [birth year], 81 y.o.   MRN: [id number]  HPI Pt in with main concern of new left sided chest pain, localized to rib, thinks that she injured are as she attempts to raise up off commode, needs a seat elevator ,a nd is also requesting in home Pt due to difficulty getting in and out of her home Appetite is fair, denies any recent seizure activity Denies uncontrolled depression or anxiety and is grateful for the love and care of her family   Review of Systems See HPI Denies recent fever or chills. Denies sinus pressure, nasal congestion, ear pain or sore throat. Denies chest congestion, productive cough or wheezing. Denies chest pains, palpitations and leg swelling Denies abdominal pain, nausea, vomiting,diarrhea or constipation.   Denies dysuria, frequency, hesitancy or incontinence.  Denies skin break down or rash.        Objective:   Physical Exam BP 122/74 mmHg  Pulse 76  Resp 18  Ht 5\' 5"  (1.651 m)  Wt 130 lb (58.968 kg)  BMI 21.63 kg/m2  SpO2 97% Patient alert and oriented and in no cardiopulmonary distress at rest  HEENT: No facial asymmetry, EOMI,   oropharynx pink and moist.  Neck decreased ROM, no JVD, no mass.  Chest: Clear to auscultation bilaterally.Decreased in bases,  though adequate air entry, tender over left ribs  in anterior axillary line  CVS: S1, S2 systolic  murmur, no S3.Regular rate.  ABD: Soft non tender.   Ext: No edema  MS: decreased  ROM spine, shoulders, hips and knees.Marked kyphosis  Skin: Intact, no ulcerations or rash noted.  Psych: Good eye contact, normal affect. Memory loss, not anxious or depressed appearing  CNS: CN 2-12 intact, grade 4  power,  In right upper and lower extremities, reduced tone throughout        Assessment & Plan:  Chest wall pain 1 week h/o left chest pain esp when raising her from commode, will obtain CXR, also order commode elevator and in home  PT, difficult getting pt safely out of her home, and shje is noted to become extremely tired also  UNSTEADY GAIT High fall risk with weakness of upper and lower extremities and poor balance. Unable to safely leave home independently, in home PT for 6 weeks. Home safety reviewed with pt and caregivers, her grandson and a female caregiver are present  Senile dementia with behavioral disturbance Increase aricept to standard treeting dose and rept MMSE at next visit, improvement in behavior noted  Essential hypertension, benign Controlled, no change in medication   Cardioembolic stroke chronic hemiparesis residually, but marked improvement from original presentation Committed to lifelong anticoagullation  Seasonal allergies Controlled, no change in medication   GERD Controlled, no change in medication   Focal motor seizure disorder Treated by neurology, denies any recent seizure activity on kepra  Anxiety state Controlled on current medication, continue same  Insomnia Controlled on current medicaton , continue same

## 2015-03-09 NOTE — Assessment & Plan Note (Signed)
Controlled on current medication, continue same 

## 2015-03-10 ENCOUNTER — Other Ambulatory Visit: Payer: Self-pay | Admitting: *Deleted

## 2015-03-10 ENCOUNTER — Telehealth: Payer: Self-pay | Admitting: Neurology

## 2015-03-10 MED ORDER — LEVETIRACETAM 750 MG PO TABS: 750.0000 mg | ORAL_TABLET | Freq: Two times a day (BID) | ORAL | Status: DC

## 2015-03-10 MED ORDER — HYDROCODONE-ACETAMINOPHEN 5-325 MG PO TABS: 1.0000 | ORAL_TABLET | Freq: Four times a day (QID) | ORAL | Status: DC | PRN

## 2015-03-10 NOTE — Telephone Encounter (Signed)
Prescription available, called patient, no answer 

## 2015-03-10 NOTE — Telephone Encounter (Signed)
Phone call from [facility] [phone] requesting refill on levETIRAcetam (KEPPRA) 750 MG tablet. [place]

## 2015-03-10 NOTE — Telephone Encounter (Signed)
Patient's son is requesting we prescribe Keppra 750mg  one twice daily.  This was previously prescribed by Dr Sabra Heck at ED in June.  Okay to fill?  Thank you.

## 2015-03-10 NOTE — Telephone Encounter (Signed)
We will continue Keppra at this dose.

## 2015-03-10 NOTE — Telephone Encounter (Signed)
PATIENTS GRANDSON Erin [name] PICKED UP RX

## 2015-03-11 ENCOUNTER — Ambulatory Visit (INDEPENDENT_AMBULATORY_CARE_PROVIDER_SITE_OTHER): Payer: Medicare Other | Admitting: Thoracic Surgery (Cardiothoracic Vascular Surgery)

## 2015-03-11 ENCOUNTER — Encounter: Payer: Self-pay | Admitting: Thoracic Surgery (Cardiothoracic Vascular Surgery)

## 2015-03-11 VITALS — BP 125/86 | HR 91 | Resp 16 | Ht 65.0 in | Wt 130.0 lb

## 2015-03-11 DIAGNOSIS — J90 Pleural effusion, not elsewhere classified: Secondary | ICD-10-CM | POA: Diagnosis not present

## 2015-03-11 DIAGNOSIS — I639 Cerebral infarction, unspecified: Secondary | ICD-10-CM

## 2015-03-11 NOTE — Progress Notes (Signed)
[address]       [place] [address]             [phone]       HPI: Erin [name] is sent for consultation regarding bilateral pleural effusions  Erin [name] is an 81 yo [name] I have known for some time now. I did a thoracoscopic wedge resection for a right lung nodule on Erin [name] in 2001. The nodule turned out to be granulomatous disease. Erin [name] had multiple other nodules, which were followed with CT for a couple of years and remained unchanged. Eventually we changed to doing annual chest xrays. I last saw Erin [name] in November 2014.   In the interim since the last visit Erin [name] suffered a stroke in September 2015 which resulted in a left hemiparesis. Erin [name] now requires assistance with ambulation and getting up and down.  A couple of weeks ago Erin [name] had an episode of left-sided chest pain. This was very severe. Erin [name] noted that when Erin [name] was eating helped off of the commode. Since then Erin [name]'s continued to have pain in the left chest with certain movements and when Erin [name] is touched on that area. Erin [name] had a chest x-ray which showed small bilateral effusions. That was followed by a CT of the chest which again showed small bilateral effusions.    Current Outpatient Prescriptions  Medication Sig Dispense Refill  . acetaminophen (TYLENOL) 500 MG tablet Take 1,000 mg by mouth every 6 (six) hours as needed for mild pain or moderate pain.    [name] ALPRAZolam (XANAX) 0.5 MG tablet Take 1 tablet (0.5 mg total) by mouth at bedtime as needed for anxiety. 30 tablet 5  . amLODipine (NORVASC) 10 MG tablet TAKE ONE TABLET BY MOUTH ONCE DAILY 30 tablet 3  . atorvastatin (LIPITOR) 20 MG tablet Take 1 tablet by mouth daily.    . busPIRone (BUSPAR) 5 MG tablet TAKE ONE TABLET BY MOUTH TWICE DAILY 60 tablet 0  . donepezil (ARICEPT) 10 MG tablet Take 1 tablet (10 mg total) by mouth at bedtime. 30 tablet 5  . ELIQUIS 5 MG TABS tablet TAKE ONE TABLET BY MOUTH TWICE DAILY 60 tablet 6  . gabapentin (NEURONTIN) 100 MG  capsule One capsule in the morning and midday, 3 capsules at bedtime 150 capsule 3  . HYDROcodone-acetaminophen (NORCO/VICODIN) 5-325 MG per tablet Take 1 tablet by mouth every 6 (six) hours as needed for moderate pain. 60 tablet 0  . levETIRAcetam (KEPPRA) 750 MG tablet Take 1 tablet (750 mg total) by mouth 2 (two) times daily. 60 tablet 5  . magnesium hydroxide (MILK OF MAGNESIA) 400 MG/5ML suspension Take by mouth daily as needed for mild constipation.    . metoprolol (LOPRESSOR) 50 MG tablet TAKE ONE-HALF TABLET BY MOUTH THREE TIMES DAILY 45 tablet 1  . mirtazapine (REMERON) 45 MG tablet Take 1 tablet (45 mg total) by mouth at bedtime. 30 tablet 5  . montelukast (SINGULAIR) 10 MG tablet Take 1 tablet (10 mg total) by mouth at bedtime. 30 tablet 3  . omeprazole (PRILOSEC) 20 MG capsule Take 1 capsule (20 mg total) by mouth 2 (two) times daily before a meal. Take 1 capsule by mouth twice daily at 6:30am and 9:00pm. 60 capsule 3  . polyethylene glycol (MIRALAX / GLYCOLAX) packet Take 17 g by mouth daily. 14 each 0  . POTASSIUM PO Take 1 tablet by mouth daily.    . Vitamin D, Ergocalciferol, (DRISDOL) 50000 UNITS CAPS capsule Take 1  capsule (50,000 Units total) by mouth every 7 (seven) days. 4 capsule 5   No current facility-administered medications for this visit.    Physical Exam BP 125/86 mmHg  Pulse 91  Resp 16  Ht 5\' 5"  (1.651 m)  Wt 130 lb (58.968 kg)  BMI 21.63 kg/m2  SpO93 78% Erin [name] appears chronically ill but in no acute distress Severe kyphoscoliosis Resting tremor right upper extremity, profound weakness left upper extremity Cardiac irregularly irregular Lungs clear bilaterally  Diagnostic Tests: CHEST 2 VIEW  COMPARISON: Left shoulder series of October 17, 2014 and chest x-ray of September 17, 2014  FINDINGS: The lungs are adequately inflated. There has been interval development of small bilateral pleural effusions slightly greater on the right than on the  left. The cardiac silhouette remains enlarged. The pulmonary vascularity is not engorged. The right apex is obscured by the patient's chin. There is prominent thoracic kyphosis. There is mild multilevel disc space narrowing and prominent thoracic kyphosis without a focal compression fracture.  IMPRESSION: Interval development of small bilateral pleural effusions. There is no alveolar pneumonia. Mild chronic pulmonary interstitial prominence may reflect mild interstitial edema or fibrosis.   Electronically Signed  By: David Martinique M.D.  On: 02/18/2015 13:25  CT CHEST WITH CONTRAST  TECHNIQUE: Multidetector CT imaging of the chest was performed during intravenous contrast administration.  CONTRAST: 91mL OMNIPAQUE IOHEXOL 300 MG/ML SOLN  COMPARISON: Chest radiographs dated 02/18/2015. CT chest dated 05/31/2003.  FINDINGS: Mediastinum/Nodes: Cardiomegaly. No pericardial effusion.  Coronary atherosclerosis.  Atherosclerotic calcifications of the aortic arch.  Small mediastinal lymph nodes measuring up to 8 mm short axis, within normal limits.  Visualized thyroid is mildly heterogeneous.  Lungs/Pleura: Postsurgical changes related to prior right upper lobe wedge resection (series 3/image 17).  3 mm subpleural nodule in the right middle lobe (series 3/image 29), unchanged from 2004, benign. No suspicious pulmonary nodules.  Small bilateral pleural effusions, right greater than left.  Associated compressive atelectasis in the bilateral lower lobes.  No pneumothorax.  Upper abdomen: Visualized upper abdomen is notable for a 4.2 cm left renal cyst.  Musculoskeletal: Exaggerated upper thoracic kyphosis.  Mild degenerative changes of the visualized thoracolumbar spine.  Bilateral breast augmentation.  IMPRESSION: Postsurgical change related to prior right upper lobe wedge resection.  No suspicious pulmonary nodules.  Small bilateral  pleural effusions, right greater than left.  Associated compressive atelectasis in the bilateral lower lobes.   Electronically Signed  By: Julian Hy M.D.  On: 03/03/2015 16:30   I personally reviewed the chest x-rays and CTs. I concur with the findings as noted above.  Impression: 81 year old [name] with multiple medical problems including stroke with left hemi-paresis, chronic atrial fibrillation, moderate tricuspid regurgitation, hypertension, seizures, granulomatous disease and severe kyphoscoliosis. Erin [name] had a wedge resection for granuloma back in 2001.   Erin [name] now presents with left-sided chest wall pain noted after being assisted off the commode. The pain is musculoskeletal in nature and likely related to the contact used to assist Erin [name]. Erin [name] has point tenderness. Erin [name] CT chest showed no evidence for rib fracture although it is possible there is a small nondisplaced cortical fracture was not showing up on scan. Most likely it is intercostal muscle pain.  Erin [name] workup revealed small bilateral pleural effusions. There is slightly more fluid on the right than the left. The fluid looks more impressive on chest x-ray that is on CT scan because some of the fluid is loculated in the fissure. Overall these are very small volume effusions.  There is nothing on the CT to suggest malignancy. I suspect this is mild congestive heart failure. Given the small size of the effusions, I think the risk of thoracentesis outweighs the benefit. I would just follow them for now since Erin [name] is not symptomatically short of breath. I recommended to Erin [name] that we repeat a chest x-ray in about 6 weeks. We can give consideration to trial of diuretics. I did advise Erin [name] son to limit Erin [name] salt intake. The effusions get larger we can do a thoracentesis.   Plan:   Return in 6 weeks with PA and lateral chest x-ray to follow-up effusions  I spent 15 minutes face-to-face with Erin [name] during this visit, greater than 50%  was spent in consultation. Melrose Nakayama, MD [facility] [phone]

## 2015-03-18 ENCOUNTER — Other Ambulatory Visit: Payer: Self-pay | Admitting: *Deleted

## 2015-03-18 MED ORDER — POLYETHYLENE GLYCOL 3350 17 G PO PACK: 17.0000 g | PACK | Freq: Every day | ORAL | Status: DC

## 2015-03-21 ENCOUNTER — Other Ambulatory Visit: Payer: Self-pay | Admitting: Licensed Clinical Social Worker

## 2015-03-21 ENCOUNTER — Encounter: Payer: Self-pay | Admitting: Licensed Clinical Social Worker

## 2015-03-21 NOTE — Patient Outreach (Signed)
Assessment: CSW completed chart review on client on 03/21/15.  CSW had communicated recently with RN [name] in discussing social work needs of client. RN [name] informed CSW that RN was not aware of any other current social work needs of client at present.  Client receives in home care assistance weekly as scheduled. She resides at home of her son, Erin [name], and client is very pleased in residing at home of her son. She has her prescribed medications. She has transport assistance to and from her scheduled medical appointments. She sees Dr. Tula Nakayama, her primary doctor as scheduled.  CSW called home phone number of client on 03/21/15.  CSW verified identity of client.  Erin [name] and CSW spoke of current needs of client.  Erin [name] said she appreciated her in home care assistance scheduled weekly.  She said she is attending her medical appointments. She spoke of lift chair. CSW informed client that Medicare would only pay for a portion of a lift chair costs.  CSW encouraged client to speak with her son Erin [name] about lift chair.  CSW informed client of [employer] in [place], [place] that sold new Comptroller. CSW suggested that perhaps Erin [name], son of client, could contact [employer] in [place], [place] to discuss current Comptroller for sale at that store. CSW informed client that sometimes particular furniture businesses would put lift chairs on sale at their stores. CSW informed client that she had met her care plan goals with social work at this time. CSW informed client that [facility] would discharge client, close case for client to [facility] services only on 03/21/15. Client will continue to receive [facility] nursing support through [facility]. Client agreed to this plan.  CSW thanked client for working with [facility] in recent months. Client was   appreciative of CSW services support received by client in recent months.  Plan: CSW is discharging Erin [name] from [facility] services only on 03/21/15 since  client has met her care plan goals for social work. CSW to inform Erin [name] on 03/21/15 that [facility] discharged client from [facility] services only on 03/21/15. CSW to send letter to Erin [name] on 03/21/15 informing Erin [name] that [facility] discharged client on 03/21/15 from [facility] services only and that client would continue to receive [facility] nursing support through [facility].  Erin [name].Erin [name] MSW, LCSW Licensed Clinical Social Worker [facility] Care Management [phone]

## 2015-03-25 ENCOUNTER — Other Ambulatory Visit: Payer: Self-pay | Admitting: *Deleted

## 2015-03-25 NOTE — Patient Outreach (Signed)
[facility]) Care Management  03/24/2015  Charna Neeb [facility] October 21, 1929 [id number]   Call received from [name], son/HCPOA/primary caregiver for Mrs. Placke. Mr. Furth called to report that Mrs. Hacker "couldn't move her left leg at all when she got up this morning." Mr. Holben went on to state that Mrs. Scrogham's symptoms seemed to improve over the course of the next few hours. He also had questions regarding the process for moving Mrs. Minasyan to a higher level of care (SNF) saying that she and he felt they were coming to a place where her safety at home was a concern because she was gradually becoming weaker.   I inquired with Mr. Cozza about any changes in Mrs. Lupo's medication regimen and advised that she strictly adhere to the prescribed medication regimen. I also advised that if Mrs. Ledo was experiencing new symptoms, as reported, it was advised that she report to the ED or call her provider to report her symptoms. Mr. Weed declined saying he would "watch her over the next 24 hours."   I provided information about guidelines for admission to SNF from home and offered to discuss level of care needs further today and on my next visit. Mr. Tung expressed thanks and told me he would get in touch with me if he felt Mrs. Rog was not improved by Tuesday.    [facility] Management  [phone]

## 2015-03-27 ENCOUNTER — Other Ambulatory Visit: Payer: Self-pay | Admitting: *Deleted

## 2015-03-27 ENCOUNTER — Telehealth: Payer: Self-pay | Admitting: Family Medicine

## 2015-03-27 NOTE — Patient Outreach (Signed)
[facility]) Care Management   03/27/2015  Erin [name] February 16, 1930 [id number]  Erin [name] is an 81 y.o. female who has a history of HTN, afib, and cardioembolic stroke in September of 2015. [facility] Care Management has been following Erin [name] in the community off and on for > 2 years for management of HTN. Unfortunately, she suffered another significant stroke in January and fell, sustaining a left femur fracture. She was subsequently hospitalized and spent some time at [facility] for rehabilitation after her hospitalization. Erin [name] discharged to her home where her adult son Erin [name] lived with her. Erin [name] did not do well in this setting, unfortunately suffering several falls and a shoulder fracture. She is now and plans to live with her very supportive son Erin [name] indefinitely. Erin [name] is still very deconditioned and dependent on family for 24 hour care.  Subjective: "I just have some awful days worrying over my family. I'm getting weaker every day."  Objective:  BP 128/74 mmHg  Pulse 72  Wt 121 lb (54.885 kg)  SpO2 96%  Review of Systems  Constitutional: Negative.   HENT: Negative.   Eyes: Negative.   Respiratory: Negative.   Cardiovascular: Negative.   Gastrointestinal: Negative.   Genitourinary: Negative.   Musculoskeletal: Positive for myalgias and back pain. Negative for falls.  Skin: Negative.   Neurological: Positive for tingling and focal weakness.       Weakness of left arm and leg secondary to stroke earlier this year; complaints of tingling/burning in feet secondary to neuropathy  Psychiatric/Behavioral: Negative.     Physical Exam  Constitutional: She is oriented to person, place, and time. Vital signs are normal. She appears lethargic. She is cooperative. She has a sickly appearance.  Cardiovascular: Normal rate and regular rhythm.   Respiratory: Effort normal and breath sounds normal. She has no wheezes. She has no rales.   GI: Soft. Bowel sounds are normal.  Neurological: She is oriented to person, place, and time. She appears lethargic.  Skin: Skin is warm, dry and intact.  Psychiatric: She has a normal mood and affect. Her speech is normal and behavior is normal. Judgment and thought content normal. Cognition and memory are normal.    Current Medications:   Current Outpatient Prescriptions  Medication Sig Dispense Refill  . acetaminophen (TYLENOL) 500 MG tablet Take 1,000 mg by mouth every 6 (six) hours as needed for mild pain or moderate pain.    [name] ALPRAZolam (XANAX) 0.5 MG tablet Take 1 tablet (0.5 mg total) by mouth at bedtime as needed for anxiety. 30 tablet 5  . amLODipine (NORVASC) 10 MG tablet TAKE ONE TABLET BY MOUTH ONCE DAILY 30 tablet 3  . atorvastatin (LIPITOR) 20 MG tablet Take 1 tablet by mouth daily.    . busPIRone (BUSPAR) 5 MG tablet TAKE ONE TABLET BY MOUTH TWICE DAILY 60 tablet 0  . donepezil (ARICEPT) 10 MG tablet Take 1 tablet (10 mg total) by mouth at bedtime. 30 tablet 5  . ELIQUIS 5 MG TABS tablet TAKE ONE TABLET BY MOUTH TWICE DAILY 60 tablet 6  . gabapentin (NEURONTIN) 100 MG capsule One capsule in the morning and midday, 3 capsules at bedtime 150 capsule 3  . HYDROcodone-acetaminophen (NORCO/VICODIN) 5-325 MG per tablet Take 1 tablet by mouth every 6 (six) hours as needed for moderate pain. 60 tablet 0  . levETIRAcetam (KEPPRA) 750 MG tablet Take 1 tablet (750 mg total) by mouth 2 (two) times daily. 60 tablet 5  .  magnesium hydroxide (MILK OF MAGNESIA) 400 MG/5ML suspension Take by mouth daily as needed for mild constipation.    . metoprolol (LOPRESSOR) 50 MG tablet TAKE ONE-HALF TABLET BY MOUTH THREE TIMES DAILY 45 tablet 1  . mirtazapine (REMERON) 45 MG tablet Take 1 tablet (45 mg total) by mouth at bedtime. 30 tablet 5  . montelukast (SINGULAIR) 10 MG tablet Take 1 tablet (10 mg total) by mouth at bedtime. 30 tablet 3  . omeprazole (PRILOSEC) 20 MG capsule Take 1 capsule (20 mg  total) by mouth 2 (two) times daily before a meal. Take 1 capsule by mouth twice daily at 6:30am and 9:00pm. 60 capsule 3  . polyethylene glycol (MIRALAX / GLYCOLAX) packet Take 17 g by mouth daily. 14 each 0  . POTASSIUM PO Take 1 tablet by mouth daily.    . Vitamin D, Ergocalciferol, (DRISDOL) 50000 UNITS CAPS capsule Take 1 capsule (50,000 Units total) by mouth every 7 (seven) days. 4 capsule 5   No current facility-administered medications for this visit.    Functional Status:   In your present state of health, do you have any difficulty performing the following activities: 01/27/2015 12/05/2014  Hearing? N N  Vision? N N  Difficulty concentrating or making decisions? [name]  Walking or climbing stairs? Y Y  Dressing or bathing? Y Y  Doing errands, shopping? [name]  Preparing Food and eating ? - Y  Using the Toilet? - Y  In the past six months, have you accidently leaked urine? - Y  Do you have problems with loss of bowel control? - N  Managing your Medications? - Y  Managing your Finances? - Y  Housekeeping or managing your Housekeeping? - Y    Fall/Depression Screening:    PHQ 2/9 Scores 02/27/2015 01/27/2015 01/07/2015 12/05/2014 08/12/2014 07/17/2013 02/13/2013  PHQ - 2 Score 2 0 1 0 0 2 0  PHQ- 9 Score - - - - - 3 -    Assessment:    Very frail 81 year old lady who had a stroke and 2 fractures this year. Caregiving has been an issue and Erin [name] is now living indefinitely with her son Erin [name]. She and her family members feels she is continually declining, growing generally weaker and requiring more hands on care.   Home Safety/Fall Risk - [name] suffered a fall last month. Her son Erin [name] was helping her walk and she "suddenly lost strength in her legs".  Erin [name] suffered no injuries. She is quite weak with very poor balance and gait; She continues to ambulate with walker and assist x at least 1 person. She has a hospital bed and bedside commode. Last month Erin [name] was able to  rise from her recliner chair with the aide of her walker and only supervision by a family member or caregiver. Today she is unable to even slide herself forward in the chair without assistance. She wishes to look into getting a "lift chair". I helped her contact Medicare to secure identity of bid winner for DME and provided those choices to Erin [name]. She would like to work with [facility] in [facility] so I called them and received information needed from [name] whom I provided with the information as requested.    Level of Care Concerns - ErinSchneider said she considered "going back to the nursing home" since her son Erin [name] hurt his back but "doesn't like it" and says "I think it would kill me to go back  there." I spent some time explaining guidelines for Medicare coverage of SNF admission and various options for level of care needs including [place] assistance for in home care, continued pvt pay services through [employer], and guidelines for qualification for ALF. At this time, Erin [name] wishes to remain in the home of her son Erin [name] where she is being well cared for and attended to.   Erin [name], Erin [name]'s son/HCPOA/primary caregiver, called me on the phone and said he felt Erin [name] was going to need a higher level of care in the near future. I reviewed with him the information outlined above that I discussed with ErinSchneider.   Plan:   [facility] CM Care Plan Problem One        Most Recent Value   Care Plan Problem One  Home Safety/ Fall Risk related to stroke symptoms and deconditioning   Role Documenting the Problem One  Care Management Coordinator   Care Plan for Problem One  Active   [facility] Long Term Goal (31-90 days)  Over the next 60 days, patient will receive appropriate supportive care for fall risk/prevention    [facility] Long Term Goal Start Date  02/27/15   Interventions for Problem One Long Term Goal  Contacted provider office to request follow up on home health PT orders,   contacted DSS re: block grant waiting list status for PCS,  reviewed fall risk management with patient/caregivers,  provided Erin [name] educational materials   [facility] CM Short Term Goal #1 (0-30 days)  Over the next 30 days, patient and caregivers will verbalize understanding of plans for ongoing physical therapy and in home care services   [facility] CM Short Term Goal #1 Start Date  02/27/15   Interventions for Short Term Goal #1  utilizing teachback method, discussed rationale for ongoing HHPT and in home care services in addition to other care options such as SNF (requiring 3 day qualifying hospital stay)   [facility] CM Short Term Goal #2 (0-30 days)  Over the next 30 days, patient and family / caregivers will verbalize understanding of fall risk precautions as outlined by PT   [facility] CM Short Term Goal #2 Start Date  02/27/15   Interventions for Short Term Goal #2  Utilizing teachback method, instructed patient/caregivers re: requesting written instructions from PT for home exercises and fall risk   [facility] CM Short Term Goal #3 (0-30 days)  in the next 14 days patient will resume HHPT as ordered by [name] as evidenced by patient and [facility] report of start of service    [facility] CM Care Plan Problem Two        Most Recent Value   Care Plan Problem Two  Deconditioning and ADL/IADL needs related to stroke and multiple fractures over last months   Role Documenting the Problem Two  Care Management Coordinator   Care Plan for Problem Two  Active   Interventions for Problem Two Long Term Goal   utilizing teachback method, reviewed with patient necessity of established and agreed upon long term plan of care that will be safe and will meet patient's daily care needs consistently   [facility] Long Term Goal (31-90) days  over the next 31 days patient will verbalize realistic plan for safe, long term care plan   [facility]N Long Term Goal Start Date  03/27/15   [facility] CM Short Term Goal #1 (0-30 days)  over the next 30 days patient or son will verbalize  successful hiring of in home care provider for  hours each week when son/dtr-in-law must be out of the home for work   [facility] CM Short Term Goal #1 Start Date  03/27/15   Interventions for Short Term Goal #2   discussed with son Erin [name] and patient community resources available for in home care providers if the new contact they have does not work out if current caregiver cannot maintain current hours       [facility] [place] Management  [phone]

## 2015-03-27 NOTE — Telephone Encounter (Signed)
Pls contact pt, explain that I am aware that she is not as strong as she was, and that I really would like her to consider going to a SNF to get help with strengthening so that  Her fall risk is reduced and independence improved.Let her know Delrae Rend has been in touch Explain that I know that it is not an easy decision especially since I know how much she enjoys her home and family, however, they will still be able to see her and spend as much time with her as she  Would want. Explain this is just an introductory call and encourage her to think about it over the weekend, she has f/u next week with me, does not have to final decision then!, does not need to fear f/u visit, needs to come in!

## 2015-03-28 NOTE — Telephone Encounter (Signed)
Spoke with patient and she states that she will think about the care that she wants over the weekend.

## 2015-03-31 ENCOUNTER — Telehealth: Payer: Self-pay | Admitting: *Deleted

## 2015-03-31 ENCOUNTER — Encounter: Payer: Self-pay | Admitting: Family Medicine

## 2015-03-31 ENCOUNTER — Telehealth: Payer: Self-pay | Admitting: Neurology

## 2015-03-31 ENCOUNTER — Ambulatory Visit (INDEPENDENT_AMBULATORY_CARE_PROVIDER_SITE_OTHER): Payer: Medicare Other | Admitting: Family Medicine

## 2015-03-31 ENCOUNTER — Encounter: Payer: Self-pay | Admitting: Gastroenterology

## 2015-03-31 VITALS — BP 132/86 | HR 80 | Resp 18 | Wt 126.0 lb

## 2015-03-31 DIAGNOSIS — L819 Disorder of pigmentation, unspecified: Secondary | ICD-10-CM | POA: Insufficient documentation

## 2015-03-31 DIAGNOSIS — F419 Anxiety disorder, unspecified: Secondary | ICD-10-CM

## 2015-03-31 DIAGNOSIS — F03918 Unspecified dementia, unspecified severity, with other behavioral disturbance: Secondary | ICD-10-CM

## 2015-03-31 DIAGNOSIS — I639 Cerebral infarction, unspecified: Secondary | ICD-10-CM

## 2015-03-31 DIAGNOSIS — F418 Other specified anxiety disorders: Secondary | ICD-10-CM

## 2015-03-31 DIAGNOSIS — N3001 Acute cystitis with hematuria: Secondary | ICD-10-CM | POA: Diagnosis not present

## 2015-03-31 DIAGNOSIS — I482 Chronic atrial fibrillation, unspecified: Secondary | ICD-10-CM

## 2015-03-31 DIAGNOSIS — F0391 Unspecified dementia with behavioral disturbance: Secondary | ICD-10-CM

## 2015-03-31 DIAGNOSIS — J3089 Other allergic rhinitis: Secondary | ICD-10-CM

## 2015-03-31 DIAGNOSIS — O020 Blighted ovum and nonhydatidiform mole: Secondary | ICD-10-CM | POA: Insufficient documentation

## 2015-03-31 DIAGNOSIS — I10 Essential (primary) hypertension: Secondary | ICD-10-CM

## 2015-03-31 DIAGNOSIS — G40109 Localization-related (focal) (partial) symptomatic epilepsy and epileptic syndromes with simple partial seizures, not intractable, without status epilepticus: Secondary | ICD-10-CM

## 2015-03-31 DIAGNOSIS — Z23 Encounter for immunization: Secondary | ICD-10-CM | POA: Diagnosis not present

## 2015-03-31 DIAGNOSIS — F329 Major depressive disorder, single episode, unspecified: Secondary | ICD-10-CM

## 2015-03-31 DIAGNOSIS — I6930 Unspecified sequelae of cerebral infarction: Secondary | ICD-10-CM

## 2015-03-31 DIAGNOSIS — K219 Gastro-esophageal reflux disease without esophagitis: Secondary | ICD-10-CM

## 2015-03-31 LAB — POCT URINALYSIS DIPSTICK
BILIRUBIN UA: NEGATIVE
GLUCOSE UA: NEGATIVE
KETONES UA: NEGATIVE
Nitrite, UA: POSITIVE
Protein, UA: 30
UROBILINOGEN UA: 1
pH, UA: 6

## 2015-03-31 MED ORDER — ESOMEPRAZOLE MAGNESIUM 20 MG PO PACK: 20.0000 mg | PACK | Freq: Every day | ORAL | Status: DC

## 2015-03-31 NOTE — Telephone Encounter (Signed)
Patient is scheduled for Dr. Nevada Crane 04/07/15 at 10:00

## 2015-03-31 NOTE — Telephone Encounter (Signed)
Patient called to request ALPRAZolam Duanne Moron) 0.5 MG tablet. States [facility] told her she can't get it filled until Thursday. Patient says she hasn't slept in 2 nights. "I'm going through a lot with my Son. My kids think I'm hooked on em and that's why I can't sleep. I'm 81 yrs old, what difference does it make", please can Dr. Jannifer Franklin call it in today?

## 2015-03-31 NOTE — Progress Notes (Signed)
Subjective:    Patient ID: Erin [name], female    DOB: [birth year], 82 y.o.   MRN: [id number]  HPI   Erin [name]     MRN: [id number]      DOB: 12/18/1929   HPI Erin [name] is here for follow up and re-evaluation of chronic medical conditions, medication management and review of any available recent lab and radiology data.  Preventive health is updated, specifically    Immunization.   Flu vaccine is administered Questions or concerns regarding consultations or procedures which the PT has had in the interim are  addressed. The PT denies any adverse reactions to current medications since the last visit. States however that she feels that she is on "too many medications" and would like to get off of some. Also states nexium is the only effective med for her GERD and though buying OTC now, would like to see if she can get it through prescriptpn.  C/o increased anxiety and depression, keeps questioning herself as to how her son could recently have been busted for selling drugs, states interested in therapy, has told me this in the past but has not followed through She is accompanied by her one other son who she lives with and her grandson. They both state they want her nowhere else and will take care of her, and she would rather stay with them , but feels as though she is a burden C/o increased odor and mild discomfort with urination in the past week, denies fever , chills or flank pain  ROS Denies recent fever or chills. Denies sinus pressure, nasal congestion, ear pain or sore throat. Denies chest congestion, productive cough or wheezing. Denies chest pains, palpitations and leg swelling Denies abdominal pain, nausea, vomiting,diarrhea or constipation.    Chronic and unchnagedjoint pain, swelling and limitation in mobility.No falls since last visit Denies headaches, seizures, numbness, or tingling.  C/o rough dark skin lesion on right jaw, wants to see dermatology PE  BP 132/86  mmHg  Pulse 80  Resp 18  Wt 126 lb 0.6 oz (57.171 kg)  SpO2 94%  Patient alert and oriented and in no cardiopulmonary distress.  HEENT: No facial asymmetry, EOMI,   oropharynx pink and moist.  Neck supple no JVD, no mass.  Chest: Clear to auscultation bilaterally.  s, no S3.Regular rate.  ABD: Soft non tender. No renal angle or supra puubic tenderness  Ext: No edema SKin : hyperpigmented  Irregular rough lesion in right tempora mandibular area MS: decreased ROM spine, shoulders, hips and knees. Marked kyphosisSkin: Intact, no ulcerations or rash noted.  Psych: Good eye contact, normal affect. Memory intact not anxious or depressed appearing.  CNS: CN 2-12 intact, power,  normal throughout.no focal deficits noted.Rest tremor present  Assessment & Plan  Essential hypertension, benign Controlled, no change in medication   Chronic atrial fibrillation Chronic, lifetime ,anti coagulation  Atrial fibrillation Rate controlled but remains irregular  Allergic rhinitis Controlled, no change in medication   Senile dementia with behavioral disturbance Continue aricept per neurology  Focal motor seizure disorder Stable on kepra which is managed by neurology  History of stroke with current residual effects Unilateral hemiparesis with increased fall risk, has benefited from PT  Anxiety and depression Increased and uncontrolled, pt tearful at visit , due to recent arrest  of one of her 2 sons, agrees on therapy and referral entered. She is not suicidal or homicidal, but very sad  Need for prophylactic vaccination and inoculation against  influenza After obtaining informed consent, the vaccine is  administered by LPN.   Pigmented skin lesion Hyperpigmented macular lesion ,on right jaw, dermatology to evaluate  Acute cystitis with hematuria afebril e with minimal symptoms , will await c/s before treatment started  GERD uuncontrolled on protonix, will attempt to pA so that  she can get nexium, which works       Review of Systems     Objective:   Physical Exam        Assessment & Plan:

## 2015-03-31 NOTE — Patient Instructions (Signed)
F/u in December call if you need me before  Urine checked today  You are referred to therapist  Discuss med changes further with dr Jannifer Franklin  We will try to get nexium in place of prescribed med for reflux since nexium works and thhat does not  Info to get help with medications will be reviewed with your son  You are referred to skin specialist , Dr Nevada Crane  Flu vaccine today

## 2015-04-01 MED ORDER — ZOLPIDEM TARTRATE 5 MG PO TABS: 5.0000 mg | ORAL_TABLET | Freq: Every evening | ORAL | Status: DC | PRN

## 2015-04-01 NOTE — Telephone Encounter (Signed)
I called the patient. I will call in a few tablets of low-dose Ambien for sleep, one time prescription.

## 2015-04-03 ENCOUNTER — Other Ambulatory Visit: Payer: Self-pay | Admitting: Family Medicine

## 2015-04-03 LAB — URINE CULTURE: Colony Count: >=100000

## 2015-04-04 ENCOUNTER — Encounter: Payer: Self-pay | Admitting: Neurology

## 2015-04-04 ENCOUNTER — Other Ambulatory Visit: Payer: Self-pay | Admitting: Family Medicine

## 2015-04-04 ENCOUNTER — Ambulatory Visit (INDEPENDENT_AMBULATORY_CARE_PROVIDER_SITE_OTHER): Payer: Medicare Other | Admitting: Neurology

## 2015-04-04 VITALS — BP 128/70 | HR 69 | Ht 65.0 in | Wt 130.0 lb

## 2015-04-04 DIAGNOSIS — I639 Cerebral infarction, unspecified: Secondary | ICD-10-CM | POA: Diagnosis not present

## 2015-04-04 DIAGNOSIS — R1314 Dysphagia, pharyngoesophageal phase: Secondary | ICD-10-CM | POA: Diagnosis not present

## 2015-04-04 DIAGNOSIS — R269 Unspecified abnormalities of gait and mobility: Secondary | ICD-10-CM | POA: Diagnosis not present

## 2015-04-04 DIAGNOSIS — I6930 Unspecified sequelae of cerebral infarction: Secondary | ICD-10-CM | POA: Diagnosis not present

## 2015-04-04 DIAGNOSIS — G63 Polyneuropathy in diseases classified elsewhere: Secondary | ICD-10-CM | POA: Diagnosis not present

## 2015-04-04 HISTORY — DX: Unspecified sequelae of cerebral infarction: I69.30

## 2015-04-04 MED ORDER — ALPRAZOLAM 0.5 MG PO TABS: 0.7500 mg | ORAL_TABLET | Freq: Every evening | ORAL | Status: DC | PRN

## 2015-04-04 MED ORDER — AMPICILLIN 500 MG PO CAPS: 500.0000 mg | ORAL_CAPSULE | Freq: Three times a day (TID) | ORAL | Status: DC

## 2015-04-04 MED ORDER — TRAMADOL HCL ER 100 MG PO TB24: 100.0000 mg | ORAL_TABLET | Freq: Every day | ORAL | Status: DC

## 2015-04-04 NOTE — Progress Notes (Signed)
Reason for visit: Peripheral neuropathy  Erin [name] is an 81 y.o. female  History of present illness:  Erin [name] is an 81 year old right-handed white female with a history of a peripheral neuropathy and chronic low back pain. The patient has had a stroke event affecting the right brain, with a left hemiparesis. She has a gait disorder, but she is able to ambulate with a walker and with some assistance. The patient has had some depression issues, she is under stress with some issues with her son. The patient has not had any falls with walking. She still has a lot of pain in her feet at night, not as much during the day. She generally will wake up around 3 AM, has difficulty getting back to sleep. She takes an occasional hydrocodone, she is on alprazolam at night, mirtazapine for depression. She reports increasing problems with dysphagia that mainly involves liquids, occasionally some with solids. The patient has increased problems with swallowing when using a straw. She has some perceived confusion in the evening hours for her family, she may get somewhat agitated night thinking about what is going on with her son.  Past Medical History  Diagnosis Date  . Anxiety   . Arthritis   . Hyperlipidemia   . Essential hypertension, benign   . Osteoporosis   . GERD (gastroesophageal reflux disease)   . Hearing loss   . Restless leg   . Tremor   . Leg edema   . Obese   . Gall bladder disease   . Cataract   . Carpal tunnel syndrome of left wrist   . Atrial fibrillation   . Sleep apnea   . Esophageal dilatation   . Frequent falls   . History of nuclear stress test 08/31/2012    Lexiscan cardiolite negative for ischemia  . Polyneuropathy in other diseases classified elsewhere 10/03/2013  . Cardioembolic stroke 77/82/4235  . UTI (lower urinary tract infection)   . History of stroke with current residual effects 11/20/2014  . Seizures   . Focal motor seizure disorder 12/24/2014    Left  leg involvment  . Sequela, post-stroke 04/04/2015     Hemiparesis, nondominant hemisphere    Past Surgical History  Procedure Laterality Date  . Appendectomy    . Cholecystectomy    . Abdominal hysterectomy    . Fracture surgery      Left arm x4  . Benign cyst removed from kidney and lung, bilateral mastectomy    . Bilateral great toenail removal    . Knee arthroscopy  2012    Right knee, Dr Aline Brochure  . Left forearm    . Breast surgery      Bilateral 2000  . Tonsillectomy    . R hand surgery    . Bronchoscopy  02/15/2000  . Right vats.  "  . Right upper lobe wedge resection.  "  . Upper gastrointestinal endoscopy  11/25/1999    Esophagitis/ Normal proximal esophagus, stomach and duodenum  . Colonoscopy  01/17/2009    Dr. Deatra Ina : Internal hemorrhoids/Diverticula, scattered in the ascending colon/  Moderate diverticulosis ascending colon to sigmoid colon  . Esophagogastroduodenoscopy   04/23/2003    Dr. Deatra Ina: HIATAL HERNIA  . Colonoscopy  01/16/2001    Normal  . Cataract extraction, bilateral    . Esophagogastroduodenoscopy  03/20/2012    TIR:WERXVQMG ring-LIKELY CAUSING MILD DYSPHAGIA/SMALL hiatal hernia/Multiple sessile polyps ranging between 3-64mm , path benign  . Cataract extraction    . Carpal  tunnel release      Left wrist  . Transthoracic echocardiogram  06/24/2009    EF=>55%, mild assymetric LVH; LA mildly dilated; mild mitral annular calcif, borderline MVP, mild-mod MR; mild-mod TR, RV systolic pressure elevated, mild pulm HTN; AV mildly sclerotic; mild pulm valve regurg - ordered r/t bradycardia   . Endovenous ablation saphenous vein w/ laser  01/2011    Right GSV  . Cholecystectomy    . Intramedullary (im) nail intertrochanteric Left 03/25/2014    Procedure: INTRAMEDULLARY NAIL INTERTROCHANTRIC LEFT HIP;  Surgeon: Marianna Payment, MD;  Location: [facility];  Service: Orthopedics;  Laterality: Left;    Family History  Problem Relation Age of Onset  . Diabetes  Mother   . Heart disease Mother   . Stroke Mother   . Cancer Sister     KIDNEY  . Emphysema Sister   . Heart disease Brother   . Heart disease Sister   . Emphysema Sister   . Cancer Sister     BREAST  . Heart disease Brother   . Colon cancer Sister   . Alzheimer's disease Father   . Heart disease Sister   . Tremor Sister   . Tremor Brother   . Alcoholism Child   . Cancer Brother   . Pancreatic cancer Brother   . Diabetes Son     Social history:  reports that she has never smoked. She has never used smokeless tobacco. She reports that she does not drink alcohol or use illicit drugs.    Allergies  Allergen Reactions  . Codeine Hives  . Morphine And Related Itching and Other (See Comments)    Makes pt hallucinate  . Actonel [Risedronate Sodium] Other (See Comments)    Abdominal pain  . Fosamax [Alendronate Sodium] Other (See Comments)    Abdominal pian  . Losartan Other (See Comments)    Hospitalized in 06/2013 with pancreatitis, losartan is associated with increased risk of pancreatitis ,  Hence discontinued  . Nitrofurantoin Other (See Comments)    Hospitalized with pancreatitis in 06/2013. Nitrofurantoin implicated as a possible cause    Medications:  Prior to Admission medications   Medication Sig Start Date End Date Taking? Authorizing Provider  acetaminophen (TYLENOL) 500 MG tablet Take 1,000 mg by mouth every 6 (six) hours as needed for mild pain or moderate pain.    Historical Provider, MD  ALPRAZolam Duanne Moron) 0.5 MG tablet Take 1 tablet (0.5 mg total) by mouth at bedtime as needed for anxiety. 01/07/15   Kathrynn Ducking, MD  amLODipine (NORVASC) 10 MG tablet TAKE ONE TABLET BY MOUTH ONCE DAILY 03/05/15   Fayrene Helper, MD  ampicillin (PRINCIPEN) 500 MG capsule Take 1 capsule (500 mg total) by mouth 3 (three) times daily. 04/03/15   Fayrene Helper, MD  atorvastatin (LIPITOR) 20 MG tablet Take 1 tablet by mouth daily. 12/20/14   Historical Provider, MD    busPIRone (BUSPAR) 5 MG tablet TAKE ONE TABLET BY MOUTH TWICE DAILY 11/25/14   Fayrene Helper, MD  donepezil (ARICEPT) 10 MG tablet Take 1 tablet (10 mg total) by mouth at bedtime. 02/17/15   Fayrene Helper, MD  ELIQUIS 5 MG TABS tablet TAKE ONE TABLET BY MOUTH TWICE DAILY 03/05/15   Kathrynn Ducking, MD  esomeprazole (NEXIUM) 20 MG packet Take 20 mg by mouth daily before breakfast. 03/31/15   Fayrene Helper, MD  gabapentin (NEURONTIN) 100 MG capsule One capsule in the morning and midday, 3 capsules at  bedtime 01/07/15   Kathrynn Ducking, MD  HYDROcodone-acetaminophen (NORCO/VICODIN) 5-325 MG per tablet Take 1 tablet by mouth every 6 (six) hours as needed for moderate pain. 03/10/15   Carole Civil, MD  levETIRAcetam (KEPPRA) 750 MG tablet Take 1 tablet (750 mg total) by mouth 2 (two) times daily. 03/10/15   Kathrynn Ducking, MD  magnesium hydroxide (MILK OF MAGNESIA) 400 MG/5ML suspension Take by mouth daily as needed for mild constipation.    Historical Provider, MD  metoprolol (LOPRESSOR) 50 MG tablet TAKE ONE-HALF TABLET BY MOUTH THREE TIMES DAILY 02/10/15   Fayrene Helper, MD  mirtazapine (REMERON) 45 MG tablet Take 1 tablet (45 mg total) by mouth at bedtime. 11/20/14   Kathrynn Ducking, MD  montelukast (SINGULAIR) 10 MG tablet Take 1 tablet (10 mg total) by mouth at bedtime. 11/28/14   Fayrene Helper, MD  polyethylene glycol [facility] Floria Raveling) packet Take 17 g by mouth daily. 03/18/15   Carole Civil, MD  POTASSIUM PO Take 1 tablet by mouth daily.    Historical Provider, MD  Vitamin D, Ergocalciferol, (DRISDOL) 50000 UNITS CAPS capsule Take 1 capsule (50,000 Units total) by mouth every 7 (seven) days. 02/24/15   Fayrene Helper, MD  zolpidem (AMBIEN) 5 MG tablet Take 1 tablet (5 mg total) by mouth at bedtime as needed for sleep. 04/01/15   Kathrynn Ducking, MD    ROS:  Out of a complete 14 system review of symptoms, the patient complains only of the following symptoms,  and all other reviewed systems are negative.  Difficulty swallowing, drooling Cough, choking Constipation Restless legs, daytime sleepiness, sleep talking Back pain, neck stiffness Moles Bruising easily Headache, numbness, tremors Agitation, depression, anxiety  Blood pressure 128/70, pulse 69, height 5\' 5"  (1.651 m), weight 130 lb (58.968 kg).  Physical Exam  General: The patient is alert and cooperative at the time of the examination.  Skin: No significant peripheral edema is noted.   Neurologic Exam  Mental status: The patient is alert and oriented x 3 at the time of the examination. The patient has apparent normal recent and remote memory, with an apparently normal attention span and concentration ability.   Cranial nerves: Facial symmetry is present. Speech is normal, no aphasia or dysarthria is noted. Extraocular movements are full. Visual fields are full.  Motor: The patient has good strength in the right extremities. With the left side, there is 4/5 strength on the arm and leg.  Sensory examination: Soft touch sensation is symmetric on the face, arms, and legs.  Coordination: The patient has good finger-nose-finger and heel-to-shin bilaterally.  Gait and station: The patient was not ambulated.  Reflexes: Deep tendon reflexes are symmetric.   Assessment/Plan:  1. History of stroke, left hemiparesis  2. Gait disorder  3. Anxiety and depression  4. Peripheral neuropathy  5. Chronic low back pain  6. Dysphagia  The patient will be set up for a modified barium swallow to evaluate the dysphagia issues which she believes is worsening. The patient has ongoing significant pain in the legs at night, impacting her ability to sleep and rest. The patient will be placed on long-acting Ultram tablets, 100 mg at night. The patient will be given a prescription for alprazolam to take 0.5 mg tablets, 1-1-1/2 at night if needed. The patient will follow-up in 6 months, sooner  if needed.  Jill Alexanders MD 04/04/2015 6:57 PM  [facility] [address] [place],  [place] [address]  Phone [phone] Fax [phone]

## 2015-04-04 NOTE — Patient Instructions (Signed)

## 2015-04-05 DIAGNOSIS — Z23 Encounter for immunization: Secondary | ICD-10-CM | POA: Insufficient documentation

## 2015-04-05 DIAGNOSIS — N3001 Acute cystitis with hematuria: Secondary | ICD-10-CM | POA: Insufficient documentation

## 2015-04-05 NOTE — Assessment & Plan Note (Signed)
Increased and uncontrolled, pt tearful at visit , due to recent arrest  of one of her 2 sons, agrees on therapy and referral entered. She is not suicidal or homicidal, but very sad

## 2015-04-05 NOTE — Assessment & Plan Note (Signed)
Controlled, no change in medication  

## 2015-04-05 NOTE — Assessment & Plan Note (Signed)
Continue aricept per neurology

## 2015-04-05 NOTE — Assessment & Plan Note (Signed)
Unilateral hemiparesis with increased fall risk, has benefited from PT

## 2015-04-05 NOTE — Assessment & Plan Note (Signed)
After obtaining informed consent, the vaccine is  administered by LPN.  

## 2015-04-05 NOTE — Assessment & Plan Note (Signed)
afebril e with minimal symptoms , will await c/s before treatment started

## 2015-04-05 NOTE — Assessment & Plan Note (Signed)
Chronic, lifetime ,anti coagulation

## 2015-04-05 NOTE — Assessment & Plan Note (Signed)
Hyperpigmented macular lesion ,on right jaw, dermatology to evaluate

## 2015-04-05 NOTE — Assessment & Plan Note (Signed)
Rate controlled but remains irregular

## 2015-04-05 NOTE — Assessment & Plan Note (Signed)
Stable on kepra which is managed by neurology

## 2015-04-05 NOTE — Assessment & Plan Note (Signed)
uuncontrolled on protonix, will attempt to pA so that she can get nexium, which works

## 2015-04-08 ENCOUNTER — Other Ambulatory Visit (HOSPITAL_COMMUNITY): Payer: Self-pay | Admitting: Neurology

## 2015-04-08 DIAGNOSIS — R1314 Dysphagia, pharyngoesophageal phase: Secondary | ICD-10-CM

## 2015-04-10 ENCOUNTER — Other Ambulatory Visit: Payer: Self-pay | Admitting: Family Medicine

## 2015-04-14 ENCOUNTER — Other Ambulatory Visit: Payer: Self-pay | Admitting: Family Medicine

## 2015-04-15 ENCOUNTER — Ambulatory Visit (HOSPITAL_COMMUNITY)
Admission: RE | Admit: 2015-04-15 | Discharge: 2015-04-15 | Disposition: A | Discharge status: Home / Self Care | Payer: Medicare Other | Source: Ambulatory Visit | Attending: Neurology | Admitting: Neurology

## 2015-04-15 DIAGNOSIS — I6930 Unspecified sequelae of cerebral infarction: Secondary | ICD-10-CM

## 2015-04-15 DIAGNOSIS — Z8673 Personal history of transient ischemic attack (TIA), and cerebral infarction without residual deficits: Secondary | ICD-10-CM | POA: Diagnosis not present

## 2015-04-15 DIAGNOSIS — R1313 Dysphagia, pharyngeal phase: Secondary | ICD-10-CM | POA: Diagnosis present

## 2015-04-15 DIAGNOSIS — R269 Unspecified abnormalities of gait and mobility: Secondary | ICD-10-CM

## 2015-04-15 DIAGNOSIS — R1314 Dysphagia, pharyngoesophageal phase: Secondary | ICD-10-CM

## 2015-04-15 DIAGNOSIS — G63 Polyneuropathy in diseases classified elsewhere: Secondary | ICD-10-CM

## 2015-04-17 ENCOUNTER — Other Ambulatory Visit: Payer: Self-pay | Admitting: *Deleted

## 2015-04-17 ENCOUNTER — Telehealth: Payer: Self-pay | Admitting: Orthopedic Surgery

## 2015-04-17 MED ORDER — HYDROCODONE-ACETAMINOPHEN 5-325 MG PO TABS
1.0000 | ORAL_TABLET | Freq: Four times a day (QID) | ORAL | Status: DC | PRN
Start: 2015-04-17 — End: 2015-05-29

## 2015-04-17 NOTE — Telephone Encounter (Signed)
Patients grandson [name] picked up Rx

## 2015-04-17 NOTE — Telephone Encounter (Signed)
Prescription is ready for pick up, Patients son is aware

## 2015-04-17 NOTE — Telephone Encounter (Signed)
Ms. Clowers is requesting a refill on medication HYDROcodone-acetaminophen (NORCO/VICODIN) 5-325 MG per tablet please advise?

## 2015-04-18 ENCOUNTER — Other Ambulatory Visit: Payer: Self-pay | Admitting: Thoracic Surgery (Cardiothoracic Vascular Surgery)

## 2015-04-18 DIAGNOSIS — J90 Pleural effusion, not elsewhere classified: Secondary | ICD-10-CM

## 2015-04-21 ENCOUNTER — Telehealth (HOSPITAL_COMMUNITY): Payer: Self-pay | Admitting: *Deleted

## 2015-04-22 ENCOUNTER — Encounter: Payer: Self-pay | Admitting: Thoracic Surgery (Cardiothoracic Vascular Surgery)

## 2015-04-22 ENCOUNTER — Ambulatory Visit
Admission: RE | Admit: 2015-04-22 | Discharge: 2015-04-22 | Disposition: A | Discharge status: Home / Self Care | Payer: Medicare Other | Source: Ambulatory Visit | Attending: Thoracic Surgery (Cardiothoracic Vascular Surgery) | Admitting: Thoracic Surgery (Cardiothoracic Vascular Surgery)

## 2015-04-22 ENCOUNTER — Ambulatory Visit (INDEPENDENT_AMBULATORY_CARE_PROVIDER_SITE_OTHER): Payer: Medicare Other | Admitting: Thoracic Surgery (Cardiothoracic Vascular Surgery)

## 2015-04-22 VITALS — BP 140/74 | HR 78 | Resp 18 | Ht 65.0 in | Wt 130.0 lb

## 2015-04-22 DIAGNOSIS — J984 Other disorders of lung: Secondary | ICD-10-CM | POA: Diagnosis not present

## 2015-04-22 DIAGNOSIS — J8410 Pulmonary fibrosis, unspecified: Secondary | ICD-10-CM

## 2015-04-22 DIAGNOSIS — J90 Pleural effusion, not elsewhere classified: Secondary | ICD-10-CM | POA: Diagnosis not present

## 2015-04-22 DIAGNOSIS — I639 Cerebral infarction, unspecified: Secondary | ICD-10-CM

## 2015-04-22 NOTE — Progress Notes (Addendum)
[address]       [place] [address]             [phone]        HPI:  Mrs. Millspaugh returns for follow-up of her bilateral pleural effusions.  She is an 81 year old woman who had a thoracoscopic wedge resection for a right lung nodule on her in 2001. The nodule turned out to be granulomatous disease. She had multiple other nodules, which were followed with CT for a couple of years and remained unchanged.  In August she developed left-sided chest pain. A chest x-ray showed small bilateral pleural effusions. A CT confirmed the effusions, but there were no findings suspicious for malignancy.    She now returns for a 6 week interval follow-up. She is no longer having left-sided chest pain. She says she's having no problems with her breathing. She had a swallowing evaluation recently which showed aspiration.  Past Medical History  Diagnosis Date  . Anxiety   . Arthritis   . Hyperlipidemia   . Essential hypertension, benign   . Osteoporosis   . GERD (gastroesophageal reflux disease)   . Hearing loss   . Restless leg   . Tremor   . Leg edema   . Obese   . Gall bladder disease   . Cataract   . Carpal tunnel syndrome of left wrist   . Atrial fibrillation ([place])   . Sleep apnea   . Esophageal dilatation   . Frequent falls   . History of nuclear stress test 08/31/2012    Lexiscan cardiolite negative for ischemia  . Polyneuropathy in other diseases classified elsewhere ([place]) 10/03/2013  . Cardioembolic stroke ([place]) 73/41/9379  . UTI (lower urinary tract infection)   . History of stroke with current residual effects 11/20/2014  . Seizures ([place])   . Focal motor seizure disorder ([place]) 12/24/2014    Left leg involvment  . Sequela, post-stroke 04/04/2015     Hemiparesis, nondominant hemisphere      Current Outpatient Prescriptions  Medication Sig Dispense Refill  . acetaminophen (TYLENOL) 500 MG tablet Take 1,000 mg by mouth every 6 (six) hours as needed  for mild pain or moderate pain.    [name] ALPRAZolam (XANAX) 0.5 MG tablet Take 1.5 tablets (0.75 mg total) by mouth at bedtime as needed for anxiety. 50 tablet 5  . amLODipine (NORVASC) 10 MG tablet TAKE ONE TABLET BY MOUTH ONCE DAILY 30 tablet 3  . atorvastatin (LIPITOR) 20 MG tablet Take 1 tablet by mouth daily.    . busPIRone (BUSPAR) 5 MG tablet TAKE ONE TABLET BY MOUTH TWICE DAILY 60 tablet 0  . donepezil (ARICEPT) 10 MG tablet Take 1 tablet (10 mg total) by mouth at bedtime. 30 tablet 5  . ELIQUIS 5 MG TABS tablet TAKE ONE TABLET BY MOUTH TWICE DAILY 60 tablet 6  . esomeprazole (NEXIUM) 20 MG packet Take 20 mg by mouth daily before breakfast. 30 each 12  . gabapentin (NEURONTIN) 100 MG capsule One capsule in the morning and midday, 3 capsules at bedtime 150 capsule 3  . HYDROcodone-acetaminophen (NORCO/VICODIN) 5-325 MG tablet Take 1 tablet by mouth every 6 (six) hours as needed for moderate pain. 60 tablet 0  . levETIRAcetam (KEPPRA) 750 MG tablet Take 1 tablet (750 mg total) by mouth 2 (two) times daily. 60 tablet 5  . magnesium hydroxide (MILK OF MAGNESIA) 400 MG/5ML suspension Take by mouth daily as needed for mild constipation.    [name]  metoprolol (LOPRESSOR) 50 MG tablet TAKE ONE-HALF TABLET BY MOUTH THREE TIMES DAILY 45 tablet 2  . mirtazapine (REMERON) 45 MG tablet Take 1 tablet (45 mg total) by mouth at bedtime. 30 tablet 5  . montelukast (SINGULAIR) 10 MG tablet TAKE ONE TABLET BY MOUTH ONCE DAILY AT BEDTIME 30 tablet 2  . polyethylene glycol (MIRALAX / GLYCOLAX) packet Take 17 g by mouth daily. 14 each 0  . POTASSIUM PO Take 1 tablet by mouth daily.    . traMADol (ULTRAM ER) 100 MG 24 hr tablet Take 1 tablet (100 mg total) by mouth at bedtime. 30 tablet 3  . Vitamin D, Ergocalciferol, (DRISDOL) 50000 UNITS CAPS capsule Take 1 capsule (50,000 Units total) by mouth every 7 (seven) days. 4 capsule 5  . zolpidem (AMBIEN) 5 MG tablet Take 1 tablet (5 mg total) by mouth at bedtime as needed for  sleep. (Patient not taking: Reported on 04/04/2015) 3 tablet 0   No current facility-administered medications for this visit.    Physical Exam BP 140/74 mmHg  Pulse 78  Resp 18  Ht 5\' 5"  (1.651 m)  Wt 130 lb (58.968 kg)  BMI 21.63 kg/m2  SpO74 64% 81 year old woman in no acute distress Severe kyphoscoliosis Drooling, left hemiparesis unchanged Mildly diminished breath sounds right base 2+ pitting edema at ankles bilaterally  Diagnostic Tests: I personally reviewed her chest x-ray. It is essentially unchanged from the films 6 weeks ago. There are small bilateral effusions right greater than left.  Impression: 81 year old woman with small bilateral pleural effusions. These did not change significantly over the past 6 weeks. I do not feel that any further workup is needed at this time.  Plan: Follow-up with [name].  I would be happy to see her back any time if I can be of any further assistance with her care.  Melrose Nakayama, MD [facility] [phone]

## 2015-04-29 ENCOUNTER — Telehealth: Payer: Self-pay | Admitting: Neurology

## 2015-04-29 ENCOUNTER — Other Ambulatory Visit: Payer: Self-pay | Admitting: *Deleted

## 2015-04-29 ENCOUNTER — Encounter: Payer: Self-pay | Admitting: *Deleted

## 2015-04-29 NOTE — Patient Outreach (Signed)
[place] [facility]) Care Management   04/29/2015  Erin [name] [birth year] [id number]  ROLA LENNON is an 81 y.o. female who has a history of HTN, afib, and cardioembolic stroke in September of 2015. [facility] Care Management has been following Mrs. Heindl in the community off and on for > 2 years for management of HTN. Unfortunately, she suffered another significant stroke in January and fell, sustaining a left femur fracture. She was subsequently hospitalized and spent some time at [facility] for rehabilitation after her hospitalization. Mrs. Dolle discharged to her home where her adult son [name] lived with her. Mrs. Pitter did not do well in this setting, unfortunately suffering several falls and a shoulder fracture. She is now and plans to live with her very supportive son [name] indefinitely. Mrs. Dulak is still very deconditioned and dependent on family for 24 hour care.  Subjective: "My shaking has got worse and I think I must have burned my foot with the heating pad."  Objective:  BP 122/72 mmHg  Pulse 77  SpO2 96%  New left foot wounds:       Review of Systems  Constitutional:       Generalized weakness and deconditioning unchanged  HENT: Negative.   Respiratory: Negative for cough, sputum production and shortness of breath.   Cardiovascular: Negative for chest pain, palpitations and leg swelling.  Gastrointestinal: Negative.   Genitourinary: Negative.   Musculoskeletal: Positive for myalgias and back pain. Negative for falls.  Skin:       See physical exam  Neurological: Positive for tremors and weakness.       Worsened tremors all 4 extremities (see images in Environmental health practitioner)  Psychiatric/Behavioral: Negative.        Patient states "sometimes I pray that God will take me home but I know His timing is best. I'll wait on Him."    Physical Exam  Constitutional: She is oriented to person, place, and time. Vital signs are normal. She appears  well-developed and well-nourished. She is active.  Cardiovascular: Normal rate, regular rhythm, S1 normal, S2 normal and normal heart sounds.   Respiratory: Effort normal and breath sounds normal. No respiratory distress.  GI: Soft. Bowel sounds are normal.  Neurological: She is alert and oriented to person, place, and time.  Skin: Skin is warm. Burn noted.     See skin assessment - new wounds to left foot See media manager and images  Psychiatric: She has a normal mood and affect. Her speech is normal and behavior is normal. Judgment and thought content normal. Cognition and memory are normal.    Current Medications:   Current Outpatient Prescriptions  Medication Sig Dispense Refill  . acetaminophen (TYLENOL) 500 MG tablet Take 1,000 mg by mouth every 6 (six) hours as needed for mild pain or moderate pain.    [name] ALPRAZolam (XANAX) 0.5 MG tablet Take 1.5 tablets (0.75 mg total) by mouth at bedtime as needed for anxiety. 50 tablet 5  . amLODipine (NORVASC) 10 MG tablet TAKE ONE TABLET BY MOUTH ONCE DAILY 30 tablet 3  . atorvastatin (LIPITOR) 20 MG tablet Take 1 tablet by mouth daily.    . busPIRone (BUSPAR) 5 MG tablet TAKE ONE TABLET BY MOUTH TWICE DAILY 60 tablet 0  . donepezil (ARICEPT) 10 MG tablet Take 1 tablet (10 mg total) by mouth at bedtime. 30 tablet 5  . ELIQUIS 5 MG TABS tablet TAKE ONE TABLET BY MOUTH TWICE DAILY 60 tablet 6  . esomeprazole (  NEXIUM) 20 MG packet Take 20 mg by mouth daily before breakfast. 30 each 12  . gabapentin (NEURONTIN) 100 MG capsule One capsule in the morning and midday, 3 capsules at bedtime 150 capsule 3  . HYDROcodone-acetaminophen (NORCO/VICODIN) 5-325 MG tablet Take 1 tablet by mouth every 6 (six) hours as needed for moderate pain. 60 tablet 0  . levETIRAcetam (KEPPRA) 750 MG tablet Take 1 tablet (750 mg total) by mouth 2 (two) times daily. 60 tablet 5  . magnesium hydroxide (MILK OF MAGNESIA) 400 MG/5ML suspension Take by mouth daily as needed for  mild constipation.    . metoprolol (LOPRESSOR) 50 MG tablet TAKE ONE-HALF TABLET BY MOUTH THREE TIMES DAILY 45 tablet 2  . mirtazapine (REMERON) 45 MG tablet Take 1 tablet (45 mg total) by mouth at bedtime. 30 tablet 5  . montelukast (SINGULAIR) 10 MG tablet TAKE ONE TABLET BY MOUTH ONCE DAILY AT BEDTIME 30 tablet 2  . polyethylene glycol (MIRALAX / GLYCOLAX) packet Take 17 g by mouth daily. 14 each 0  . POTASSIUM PO Take 1 tablet by mouth daily.    . traMADol (ULTRAM ER) 100 MG 24 hr tablet Take 1 tablet (100 mg total) by mouth at bedtime. 30 tablet 3  . Vitamin D, Ergocalciferol, (DRISDOL) 50000 UNITS CAPS capsule Take 1 capsule (50,000 Units total) by mouth every 7 (seven) days. 4 capsule 5  . zolpidem (AMBIEN) 5 MG tablet Take 1 tablet (5 mg total) by mouth at bedtime as needed for sleep. (Patient not taking: Reported on 04/04/2015) 3 tablet 0   Assessment:    Very frail 81 year old lady who had a stroke and 2 fractures this year. Caregiving has been an issue and Mrs. Sanzo is now living indefinitely with her son [name]. She and her family members feels she is continually declining, growing generally weaker and requiring more hands on care.   Home Safety/Fall Risk -Mrs. Rembert has very poor balance and her gait is disturbed. She moves about the home with walker and assist x at least 1 person. Today she shows me that her tremors "are a whole lot worse". She says she has noticed worsened tremors in the last 2 weeks. I notified Dr. Jannifer Franklin.   Level of Care Concerns - Mrs.Seiler lives in the home of her son [name] who is her POA. He, his wife, and sons take care of Mrs. Caruth at home. They regularly discuss Mrs. Gotham going to SNF for long term care but always end up deciding that she is going to stay at home. That is the plan for now.   New/Acute Health Condition - Mrs. Ketter says she thinks she must have burned her left foot with the heating pad. She has been going to sleep at night with a  heating pad directly on each leg/foot. She says it stays on all night long. She has 2 new wounds - see images above and in Environmental health practitioner. She says the wounds have been there for "maybe a week" but she didn't "complain about it" because she feels she complains too much. [name] notified. I have scheduled follow up appointment with me (home visit) next week. Barrier dressing was applied after skin and wounds were gently cleansed with warm soapy water. She says she doesn't feel the wounds and can barely feel me touching her feet.   Chronic Health Condition/Pain (Peripheral Neuropathy) - Mrs. Cary says she was using the heating pad because the pain in her legs and feet from  neuropathy continues to get worse. She is taking all medications as prescribed.   New/Worsened condition (tremor) - Mrs. Nieman has noticeably worsened tremors in all 4 extremities. She says "I shake like this all the time now." I provided video images to Dr. Jannifer Franklin and contacted his office to ensure that he received the information.   Plan:   I notified [name] of Mrs. Devereux's wounds.   I notified Dr. Jannifer Franklin of Mrs. Awtrey's tremor and worsened neuropathy pain.   Mrs. Dietze has been instructed NOT to use the heating pad on her legs/feet.   I will see Mrs. Eckstein on Monday to assess progress of her wounds.    [facility]  [phone]

## 2015-04-29 NOTE — Telephone Encounter (Signed)
[name] with [facility] called stating pt had worsening tremors (she has put this in [employer] and she has emailed Dr Jannifer Franklin a cc of the video) Please call at [phone]

## 2015-04-30 ENCOUNTER — Telehealth: Payer: Self-pay

## 2015-04-30 ENCOUNTER — Other Ambulatory Visit: Payer: Self-pay | Admitting: *Deleted

## 2015-04-30 MED ORDER — SILVER SULFADIAZINE 1 % EX CREA: 1.0000 "application " | TOPICAL_CREAM | Freq: Every day | CUTANEOUS | Status: DC

## 2015-04-30 NOTE — Patient Outreach (Signed)
[place] [facility]) Care Management  04/30/2015  Erin [name] [facility] 1930/03/31 [id number]  Call received from Dr. Griffin Dakin office today. Courtney related that [name] was prescribing silvadene cream and dressing changes for Erin [name]'s new foot wounds. I will see her at home tomorrow so I can assess the wounds and review the skin care orders with her grandson/caregiver.   Call also received from Dr. Floyde Parkins re: Erin [name]'s tremors and management. Dr. Jannifer Franklin recommended that Erin [name] continue prescribed Gabapentin and Xanax and report and other significant worsening of tremor or other possible neurologic symptom. Dr. Jannifer Franklin related that intermittent worsening of tremor, especially with fatigue/insomnia/stress or emotional difficulty. I spoke with Erin [name]'s son/HCPOA Erin [name] today to review all with him and he verbalized understanding. I will discuss further with [name] on our visit tomorrow.    [place] Management  [phone]

## 2015-04-30 NOTE — Telephone Encounter (Signed)
I called the nurse, left a message. The patient does have a history of essential tremors, this was present well prior to the stroke, and has been suppressed on the left side with a hemiparesis. The tremors are more prominent on the right, and can vary widely depending upon emotional state of the patient. The patient will have good days and bad days with the tremor. The medications may offer some benefit, but usually the benefit is modest in nature. From my prior examinations, I have seen quite large for patient's from one exam to the next with the degree of the tremor. When the patient is calm, the tremor is much better.

## 2015-04-30 NOTE — Telephone Encounter (Signed)
Spoke with Grandson and he is aware that Silvadene Cream has been sent.   Will consult with Alisa and see if she can manage wounds or if [facility] referral is needed.

## 2015-05-01 ENCOUNTER — Other Ambulatory Visit: Payer: Self-pay | Admitting: *Deleted

## 2015-05-01 NOTE — Patient Outreach (Signed)
[place] [facility]) Care Management  05/01/2015  Tinisha Etzkorn [facility] 1930-02-19 [id number]   Brief visit with Mrs. Beets today to start Silvadene cream and dressings to wounds left foot as prescribed by Dr.Margaret Moshe Cipro. I reviewed procedure with grandson and patient. HHRN to begin visits for wound care/dressing changes.   I will see Mrs. Ursua at home next week for routine visit and wound follow up.    [place] Management  [phone]

## 2015-05-05 ENCOUNTER — Telehealth: Payer: Self-pay | Admitting: Orthopedic Surgery

## 2015-05-05 ENCOUNTER — Other Ambulatory Visit: Payer: Self-pay | Admitting: *Deleted

## 2015-05-05 MED ORDER — POLYETHYLENE GLYCOL 3350 17 G PO PACK: 17.0000 g | PACK | Freq: Every day | ORAL | Status: AC

## 2015-05-05 NOTE — Telephone Encounter (Signed)
Patient is requesting a medication refill on polyethylene glycol (MIRALAX / GLYCOLAX) packet please advise?

## 2015-05-05 NOTE — Patient Outreach (Signed)
[place] [facility]) Care Management   05/05/2015  Erin [name] [birth year] [id number]  Erin [name] is an 81 y.o. female who has a history of HTN, afib, and cardioembolic stroke in September of 2015. [facility] Care Management has been following Erin [name] in the community off and on for > 2 years for management of HTN. Unfortunately, she suffered another significant stroke in January and fell, sustaining a left femur fracture. She was subsequently hospitalized and spent some time at [facility] for rehabilitation after her hospitalization. Erin [name] discharged to her home where her adult son [name] lived with her. Erin [name] did not do well in this setting, unfortunately suffering several falls and a shoulder fracture. She is now and plans to live with her very supportive son [name] indefinitely. Erin [name] is still very deconditioned and dependent on family for 24 hour care.  Subjective: "Erin [name] has been doing a good job on my foot. My feet still hurt all the time. My back hurts today. What can I do to help that?"  Objective:   Review of Systems  Constitutional: Negative.   HENT: Negative.   Eyes: Negative.   Respiratory: Negative.   Cardiovascular: Negative for leg swelling.  Musculoskeletal: Positive for myalgias and back pain. Negative for falls.  Skin:       Lateral left food wound #1 2.5cm x 1.5cm now with granulation at wound edge; necrotic tissue has fallen away  Lateral left foot wound Stage 1 (#2) .5cm x 1cm with pink edges and granulation present  Neurological: Positive for tremors and focal weakness.       LUE weakness secondary to stroke  Psychiatric/Behavioral: Negative.        Mood improved today, smiling/conversant    Physical Exam  Constitutional: She is oriented to person, place, and time. She appears well-developed.  Neurological: She is alert and oriented to person, place, and time.  Skin: Skin is warm and dry.     Psychiatric: She has a  normal mood and affect. Her speech is normal and behavior is normal. Judgment and thought content normal. Cognition and memory are normal.    Fall/Depression Screening:    PHQ 2/9 Scores 05/05/2015 04/29/2015 02/27/2015 01/27/2015 01/07/2015 12/05/2014 08/12/2014  PHQ - 2 Score 1 1 2  0 1 0 0  PHQ- 9 Score - - - - - - -    Assessment:    New/Acute Health Condition - I saw Erin [name] in follow up today for 2 wounds to the blade of her left foot, sustained approximately 2 weeks ago when she says she must have burned her left foot when she left the heating pad on her feet all night. [name] ordered daily dressing changes and Silvadene cream. I provided wound care and instructed her grandson Erin [name] about wound care and dressing changes. Erin [name] has been providing this hands on care and has done an excellent job. I provided wound care today and upon examination noted that old burn tissue has sloughed away and granulation is noted at both wound edges. No signs or symptoms of infection present. I will see Erin [name] again on Thursday and Tuesday and Erin [name] will continue daily wound care, calling for any concerns or changes.    Chronic Health Condition/Pain (Peripheral Neuropathy) - Erin [name] says she was using the heating pad because the pain in her legs and feet from neuropathy continues to get worse but says now that she knows what the heating pad can do, she'll "never  do that again.". She is taking all medications as prescribed and today appeared to be more comfortable. She complained of chronic LE neuropathic pain but was able to converse and smile and carry on conversation without speaking about pain in her legs. She did report back and shoulder pain, likely related to deconditioning and slumped position in her chair. I reviewed with her and grandson appropriate repositioning and using pillows for support. In addition, she has her grandson get out a brace that was prescribed sometime last year that she  remembered helped with this same discomfort. We reviewed appropriate application and she will begin using it for short periods at a time.    New/Worsened condition (tremor) - Erin [name] has noticeably worsened tremors in all 4 extremities over the last several months. Her tremors were noticeably worse in the RUE and BLE. I discussed this with Dr. Floyde [name], Erin [name]'s neurologist and he reviewed her medication regimen and expected variation in tremors which I in turn discussed with Erin [name] and her son and grandson. Her tremors were much improved today and non-existent in the lower extremities.   Plan:   I will see Erin [name] again on Thursday and Tuesday for wound evaluation and follow up.   THN CM Care Plan Problem One        Most Recent Value   Care Plan Problem One  Home Safety/ Fall Risk related to stroke symptoms and deconditioning   Role Documenting the Problem One  Care Management [place] for Problem One  Active   THN Long Term Goal (31-90 days)  Over the next 60 days, patient will receive appropriate supportive care for fall risk/prevention    THN Long Term Goal Start Date  04/29/15   Interventions for Problem One Long Term Goal  Utilizing teachback method, reviewed home safety plans and goals   THN CM Short Term Goal #1 (0-30 days)  Over the next 30 days patient and caregivers will verbalize understanding of home safety plans including removal of rugs and keeping walkways clear   THN CM Short Term Goal #1 Start Date  04/29/15   Interventions for Short Term Goal #1  Utilizing teachback method, reviewed home safety guidelines with patient/family   THN CM Short Term Goal #2 (0-30 days)  Over the next 30 days patient will not use heating pad    THN CM Short Term Goal #2 Start Date  04/29/15   Interventions for Short Term Goal #2  Utilizing teachback method, reviewed with patient/family safety risks associated with use of heating pad    THN CM Care Plan  Problem Two        Most Recent Value   Care Plan Problem Two  Acute/Chronic Health Condition (tremors)   Role Documenting the Problem Two  Care Management Coordinator   Care Plan for Problem Two  Active   Interventions for Problem Two Long Term Goal   Utilizing teachback method, reviewed with family importance of notifying provider of any changes or worsening symptoms and asking any questions during visits   THN Long Term Goal (31-90) days  Over the next 31 days, patient and family will verbalize understanding of plan of care from neurologist   Jack Term Goal Start Date  04/29/15   THN CM Short Term Goal #1 (0-30 days)  Over the next 14 days, patient will verbalize understanding of provider recommendations for worsening tremors   THN CM Short Term Goal #1 Start Date  04/29/15  Interventions for Short Term Goal #2   discussed worsened tremors and medication regimen with provider and followed up with patient/caregivers utiilizing teachback method to review provider instructions    [facility] CM Care Plan Problem Three        Most Recent Value   Care Plan Problem Three  Chronic Health Condition (pain)   Role Documenting the Problem Three  Care Management Coordinator   Care Plan for Problem Three  Active   THN Long Term Goal (31-90) days  Over the next 31 days patient and family/caregivers will verbalize understanding of prescribed pain management regimen   THN Long Term Goal Start Date  05/05/15   Interventions for Problem Three Long Term Goal  Utilizing teachback method, reviewed use of brace, positioning, and pillows to assist with alleviation of pain in back/shoulders   THN CM Short Term Goal #1 (0-30 days)  Over the next 30 days patient will utilize appropriate assist devices for pain management   THN CM Short Term Goal #1 Start Date  05/05/15   Interventions for Short Term Goal #1  Utilizing demonstration, reviewed appropriate use of pillows and brace to assist with pain management         [place] Management  [phone]

## 2015-05-05 NOTE — Telephone Encounter (Signed)
Refilled, advised pharmacy primary care md to provide further refills

## 2015-05-08 ENCOUNTER — Other Ambulatory Visit: Payer: Self-pay | Admitting: *Deleted

## 2015-05-08 ENCOUNTER — Other Ambulatory Visit: Payer: Self-pay

## 2015-05-08 MED ORDER — SILVER SULFADIAZINE 1 % EX CREA: 1.0000 "application " | TOPICAL_CREAM | Freq: Every day | CUTANEOUS | Status: DC

## 2015-05-08 NOTE — Patient Outreach (Signed)
[place] [facility]) Care Management   05/08/2015  Erin [name] Nov 18, 1929 [id number]  Erin [name] is an 81 y.o. female  who has a history of HTN, afib, and cardioembolic stroke in September of 2015. She recently developed 2 wounds on the blade of her left foot after going to sleep with heating pads wrapped around both foot (no barrier, direct contact). [name] prescribede Silvadene and daily dressing changes which are being performed by her grandson [name] who is her primary daily caregiver. I am seeing Erin [name] today to examine her feet and determine progress with wound healing.   Subjective: "I can't feel those sore spots at all. I don't know if they're better or not."  Objective:   Review of Systems  Constitutional: Negative.   Eyes: Negative.   Respiratory: Negative.   Cardiovascular: Negative.   Gastrointestinal: Negative.   Genitourinary: Negative.        Incontinent of urine  Musculoskeletal: Positive for myalgias, back pain and neck pain. Negative for falls.       Continued complaint of neck pain; states it feels like "a crick in my neck that I just can't get out"  Skin:       See physical exam and images  Neurological:       Reduced sensation both feet  Psychiatric/Behavioral: Negative.     Physical Exam  Constitutional: She is oriented to person, place, and time. She appears well-developed.  Neurological: She is alert and oriented to person, place, and time.  Skin:     Psychiatric: She has a normal mood and affect. Her speech is normal and behavior is normal. Judgment and thought content normal. Cognition and memory are normal.     Assessment:  81 year old female with 2 wounds to blade of left foot sustained from burns from heating pad.   Wound images LEFT foot October 18 - prior to initiation of treatment (Silvadene daily):       Wound images LEFT foot today October 27:      Wound #1 Forefoot   Wound #2 left  lateral       On examination today, noted 2 red areas on RIGHT FOOT same bony prominences as left foot wound locations, no skin breakdown.   RIGHT foot lateral blade    Plan:  I reviewed in detail with Erin [name], her son [name], and her grandson [name] that her feet were to have NO contact with surfaces and were to be elevated on a folded towel, off the chair or bed surface. I reiterated the importance of not using heating pad at all, under any circumstances. Replicare dressing applied to these new red areas.   Erin [name]'s grandson [name] will continue daily wound care.   I will see Erin [name] at home next week for a routine home visit and will continue wound evaluation.    [place] Management  [phone]

## 2015-05-12 ENCOUNTER — Ambulatory Visit (INDEPENDENT_AMBULATORY_CARE_PROVIDER_SITE_OTHER): Payer: Medicare Other | Admitting: Psychology

## 2015-05-12 DIAGNOSIS — F411 Generalized anxiety disorder: Secondary | ICD-10-CM | POA: Diagnosis not present

## 2015-05-13 ENCOUNTER — Other Ambulatory Visit: Payer: Self-pay | Admitting: *Deleted
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
## 2015-05-16 ENCOUNTER — Telehealth: Payer: Self-pay | Admitting: Family Medicine

## 2015-05-16 NOTE — Telephone Encounter (Signed)
I have directly contacted Alisa electronically, no need for nurse to call

## 2015-05-16 NOTE — Telephone Encounter (Signed)
pls call Alisa , let her know that i reviewed the scans she sent on the pt and it seems as though her skin is improving. If this is correct , continue current management, if not she will need more aggressive and MD supervised care through vascular , pls send me a msg after you spk with Alisa, thanks

## 2015-05-19 ENCOUNTER — Other Ambulatory Visit: Payer: Self-pay | Admitting: *Deleted

## 2015-05-19 DIAGNOSIS — I639 Cerebral infarction, unspecified: Secondary | ICD-10-CM

## 2015-05-19 NOTE — Patient Outreach (Signed)
[facility]) Care Management  05/19/2015  Emmersyn Kratzke [facility] September 11, 1929 [id number]  Call received today from Mr. Zitlaly Malson, son/primary caregiver/HCPOA for Erin [name]. Mr. Mcdade says his mother wishes to pursue long term SNF placement in the [place] area. I have an appointment already scheduled to see Mrs. Behnke at home tomorrow and will discuss the details with her at length then.   I have referred Mrs. Lizana to our clinical social work team for assistance with placement and have requested assistance from Dr. Griffin Dakin office with [place] completion and faxing to [place] area facilities.   [place] Management  [phone]

## 2015-05-19 NOTE — Patient Outreach (Signed)
[facility]) Care Management  05/19/2015  Erin [name] [facility] 16-Oct-1929 [id number]   Request from [name], RN to assign SW, assigned Erin Nan, LCSW.  Thanks, Ronnell Freshwater. [place], Drew Assistant Phone: [phone] Fax: [phone]

## 2015-05-20 ENCOUNTER — Other Ambulatory Visit: Payer: Self-pay | Admitting: *Deleted

## 2015-05-20 ENCOUNTER — Other Ambulatory Visit: Payer: Self-pay | Admitting: Licensed Clinical Social Worker

## 2015-05-20 ENCOUNTER — Encounter: Payer: Self-pay | Admitting: *Deleted
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
## 2015-05-20 NOTE — Patient Outreach (Signed)
Assessment:  CSW received referral on Erin [name]. CSW completed chart review on client on 05/20/15. Client has been receiving [facility] nursing support with RN Erin [name]. Erin [name] informed CSW that client was in need of skilled nursing facility level of care. Erin [name] informed CSW that Erin. Darnelle Bos had completed FL-2 on client and was faxing document to area skilled nursing care facilities.  CSW called [facility] facility on 05/20/15 and left a phone message for Erin [name], admissions director to return call to CSW at [phone] to discuss current needs of client. CSW informed Erin [name] via phone message that Erin [name] had completed FL2 document for client and could fax FL2 document to [facility] facility as needed.  CSW later spoke via phone with Erin [name] at [facility] facility.  CSW informed Erin [name] of current client needs and client desire to receive skilled nursing facility level of care.  CSW gave Erin [name] the phone number of RN Erin [name] [phone]).  CSW encouraged Erin [name] to call RN Erin [name] to further discuss current client needs.  RN Erin [name] recently completed home visit with client. Erin [name] said she would call RN Erin [name] to further discuss client nursing needs at present. Erin [name] understood that Erin. Moshe [name] has client FL-2 document to fax to [place] facility as needed.  Client has support from son Erin [name] who is [place] of Attorney for client. CSW encouraged Erin [name] to call CSW at [phone] as needed to discuss potential client admission to [place] facility.  Plan: CSW called RN Erin [name] on 05/20/15 and informed Erin [name] of above information and informed Erin [name] that Erin [name] would call RN to discuss client nursing care needs. CSW to collaboate with RN Erin [name] in discussing client level of care needs. CSW to contact client in two weeks to discuss client needs at at that time.Erin [name].Erin [name] MSW, LCSW Licensed  Clinical Social Worker [facility] Care Management [phone]

## 2015-05-22 ENCOUNTER — Encounter: Payer: Self-pay | Admitting: Licensed Clinical Social Worker

## 2015-05-23 ENCOUNTER — Ambulatory Visit: Payer: Medicare Other | Admitting: Neurology

## 2015-05-26 ENCOUNTER — Telehealth: Payer: Self-pay

## 2015-05-26 ENCOUNTER — Emergency Department (HOSPITAL_COMMUNITY)
Admission: EM | Admit: 2015-05-26 | Discharge: 2015-05-26 | Disposition: A | Discharge status: Home / Self Care | Payer: Medicare Other | Attending: Emergency Medicine | Admitting: Emergency Medicine

## 2015-05-26 ENCOUNTER — Encounter (HOSPITAL_COMMUNITY): Payer: Self-pay | Admitting: *Deleted

## 2015-05-26 DIAGNOSIS — Z8673 Personal history of transient ischemic attack (TIA), and cerebral infarction without residual deficits: Secondary | ICD-10-CM | POA: Insufficient documentation

## 2015-05-26 DIAGNOSIS — M1990 Unspecified osteoarthritis, unspecified site: Secondary | ICD-10-CM | POA: Insufficient documentation

## 2015-05-26 DIAGNOSIS — H9190 Unspecified hearing loss, unspecified ear: Secondary | ICD-10-CM | POA: Diagnosis not present

## 2015-05-26 DIAGNOSIS — R339 Retention of urine, unspecified: Secondary | ICD-10-CM

## 2015-05-26 DIAGNOSIS — K219 Gastro-esophageal reflux disease without esophagitis: Secondary | ICD-10-CM | POA: Diagnosis not present

## 2015-05-26 DIAGNOSIS — N3000 Acute cystitis without hematuria: Secondary | ICD-10-CM

## 2015-05-26 DIAGNOSIS — Z87440 Personal history of urinary (tract) infections: Secondary | ICD-10-CM | POA: Insufficient documentation

## 2015-05-26 DIAGNOSIS — E669 Obesity, unspecified: Secondary | ICD-10-CM | POA: Diagnosis not present

## 2015-05-26 DIAGNOSIS — M40209 Unspecified kyphosis, site unspecified: Secondary | ICD-10-CM | POA: Insufficient documentation

## 2015-05-26 DIAGNOSIS — I10 Essential (primary) hypertension: Secondary | ICD-10-CM | POA: Diagnosis not present

## 2015-05-26 DIAGNOSIS — R300 Dysuria: Secondary | ICD-10-CM | POA: Diagnosis present

## 2015-05-26 DIAGNOSIS — E785 Hyperlipidemia, unspecified: Secondary | ICD-10-CM | POA: Insufficient documentation

## 2015-05-26 DIAGNOSIS — Z79899 Other long term (current) drug therapy: Secondary | ICD-10-CM | POA: Insufficient documentation

## 2015-05-26 DIAGNOSIS — G40909 Epilepsy, unspecified, not intractable, without status epilepticus: Secondary | ICD-10-CM | POA: Insufficient documentation

## 2015-05-26 DIAGNOSIS — I4891 Unspecified atrial fibrillation: Secondary | ICD-10-CM | POA: Insufficient documentation

## 2015-05-26 DIAGNOSIS — F419 Anxiety disorder, unspecified: Secondary | ICD-10-CM | POA: Diagnosis not present

## 2015-05-26 LAB — CBC WITH DIFFERENTIAL/PLATELET
BASOS PCT: 0 %
Basophils Absolute: 0 10*3/uL (ref 0.0–0.1)
EOS ABS: 0.3 10*3/uL (ref 0.0–0.7)
Eosinophils Relative: 4 %
HEMATOCRIT: 40.2 % (ref 36.0–46.0)
HEMOGLOBIN: 12.9 g/dL (ref 12.0–15.0)
LYMPHS ABS: 2.2 10*3/uL (ref 0.7–4.0)
Lymphocytes Relative: 32 %
MCH: 32.7 pg (ref 26.0–34.0)
MCHC: 32.1 g/dL (ref 30.0–36.0)
MCV: 102 fL — ABNORMAL HIGH (ref 78.0–100.0)
MONO ABS: 0.4 10*3/uL (ref 0.1–1.0)
MONOS PCT: 6 %
NEUTROS PCT: 58 %
Neutro Abs: 4 10*3/uL (ref 1.7–7.7)
Platelets: 231 10*3/uL (ref 150–400)
RBC: 3.94 MIL/uL (ref 3.87–5.11)
RDW: 13.2 % (ref 11.5–15.5)
WBC: 6.9 10*3/uL (ref 4.0–10.5)

## 2015-05-26 LAB — BASIC METABOLIC PANEL
ANION GAP: 6 (ref 5–15)
BUN: 11 mg/dL (ref 6–20)
CO2: 31 mmol/L (ref 22–32)
Calcium: 8.9 mg/dL (ref 8.9–10.3)
Chloride: 106 mmol/L (ref 101–111)
Creatinine, Ser: 0.64 mg/dL (ref 0.44–1.00)
GFR calc Af Amer: >60 mL/min (ref >60)
Glucose, Bld: 82 mg/dL (ref 65–99)
POTASSIUM: 3.4 mmol/L — AB (ref 3.5–5.1)
SODIUM: 143 mmol/L (ref 135–145)

## 2015-05-26 LAB — URINE MICROSCOPIC-ADD ON

## 2015-05-26 LAB — URINALYSIS, ROUTINE W REFLEX MICROSCOPIC
Bilirubin Urine: NEGATIVE
Glucose, UA: NEGATIVE mg/dL
KETONES UR: NEGATIVE mg/dL
NITRITE: POSITIVE — AB
Protein, ur: NEGATIVE mg/dL
Specific Gravity, Urine: 1.01 (ref 1.005–1.030)
UROBILINOGEN UA: 0.2 mg/dL (ref 0.0–1.0)
pH: 6 (ref 5.0–8.0)

## 2015-05-26 MED ORDER — CIPROFLOXACIN HCL 500 MG PO TABS: 500.0000 mg | ORAL_TABLET | Freq: Two times a day (BID) | ORAL | Status: DC

## 2015-05-26 NOTE — ED Notes (Signed)
PT and family given instruction on catheter care and use and pt to f/u with [facility] for urinary retention.

## 2015-05-26 NOTE — ED Notes (Signed)
Post void residual 266 per bladder scanner.

## 2015-05-26 NOTE — ED Provider Notes (Signed)
CSN: [id number]     Arrival date & time 05/26/15  1129 History  By signing my name below, I, Soijett Blue, attest that this documentation has been prepared under the direction and in the presence of Davonna Belling, MD. Electronically Signed: Soijett Blue, ED Scribe. 05/26/2015. 1:05 PM.   Chief Complaint  Patient presents with  . Dysuria     The history is provided by the patient. No language interpreter was used.    Erin [name] is a 81 y.o. female with a medical hx of UTI who presents to the Emergency Department complaining of dysuria onset 3 days. She notes that she was seen by her PCP in September and dx with a UTI to which he wanted her to urinate in a cup at home for analysis. Pt is having associated symptoms of chills, urinary hesitancy, appetite change, and back pain. Denies recent falls or trauma. She notes that she has tried cranberry juice with no relief of her symptoms. She denies fever and any other symptoms.    Past Medical History  Diagnosis Date  . Anxiety   . Arthritis   . Hyperlipidemia   . Essential hypertension, benign   . Osteoporosis   . GERD (gastroesophageal reflux disease)   . Hearing loss   . Restless leg   . Tremor   . Leg edema   . Obese   . Gall bladder disease   . Cataract   . Carpal tunnel syndrome of left wrist   . Atrial fibrillation ([place])   . Sleep apnea   . Esophageal dilatation   . Frequent falls   . History of nuclear stress test 08/31/2012    Lexiscan cardiolite negative for ischemia  . Polyneuropathy in other diseases classified elsewhere ([place]) 10/03/2013  . Cardioembolic stroke ([place]) [id number]  . UTI (lower urinary tract infection)   . History of stroke with current residual effects 11/20/2014  . Seizures ([place])   . Focal motor seizure disorder ([place]) 12/24/2014    Left leg involvment  . Sequela, post-stroke 04/04/2015     Hemiparesis, nondominant hemisphere   Past Surgical History  Procedure Laterality Date  . Appendectomy     . Cholecystectomy    . Abdominal hysterectomy    . Fracture surgery      Left arm x4  . Benign cyst removed from kidney and lung, bilateral mastectomy    . Bilateral great toenail removal    . Knee arthroscopy  2012    Right knee, Dr Aline Brochure  . Left forearm    . Breast surgery      Bilateral 2000  . Tonsillectomy    . R hand surgery    . Bronchoscopy  02/15/2000  . Right vats.  "  . Right upper lobe wedge resection.  "  . Upper gastrointestinal endoscopy  11/25/1999    Esophagitis/ Normal proximal esophagus, stomach and duodenum  . Colonoscopy  01/17/2009    Dr. Deatra Ina : Internal hemorrhoids/Diverticula, scattered in the ascending colon/  Moderate diverticulosis ascending colon to sigmoid colon  . Esophagogastroduodenoscopy   04/23/2003    Dr. Deatra Ina: HIATAL HERNIA  . Colonoscopy  01/16/2001    Normal  . Cataract extraction, bilateral    . Esophagogastroduodenoscopy  03/20/2012    [id number] ring-LIKELY CAUSING MILD DYSPHAGIA/SMALL hiatal hernia/Multiple sessile polyps ranging between 3-83mm , path benign  . Cataract extraction    . Carpal tunnel release      Left wrist  . Transthoracic echocardiogram  06/24/2009  EF=>55%, mild assymetric LVH; LA mildly dilated; mild mitral annular calcif, borderline MVP, mild-mod MR; mild-mod TR, RV systolic pressure elevated, mild pulm HTN; AV mildly sclerotic; mild pulm valve regurg - ordered r/t bradycardia   . Endovenous ablation saphenous vein w/ laser  01/2011    Right GSV  . Cholecystectomy    . Intramedullary (im) nail intertrochanteric Left 03/25/2014    Procedure: INTRAMEDULLARY NAIL INTERTROCHANTRIC LEFT HIP;  Surgeon: Marianna Payment, MD;  Location: [facility];  Service: Orthopedics;  Laterality: Left;   Family History  Problem Relation Age of Onset  . Diabetes Mother   . Heart disease Mother   . Stroke Mother   . Cancer Sister     KIDNEY  . Emphysema Sister   . Heart disease Brother   . Heart disease Sister   .  Emphysema Sister   . Cancer Sister     BREAST  . Heart disease Brother   . Colon cancer Sister   . Alzheimer's disease Father   . Heart disease Sister   . Tremor Sister   . Tremor Brother   . Alcoholism Child   . Cancer Brother   . Pancreatic cancer Brother   . Diabetes Son    Social History  Substance Use Topics  . Smoking status: Never Smoker   . Smokeless tobacco: Never Used  . Alcohol Use: No   OB History    No data available     Review of Systems  Constitutional: Negative for fever.  Genitourinary: Positive for dysuria and difficulty urinating.  Musculoskeletal: Positive for back pain.      Allergies  Codeine; Morphine and related; Actonel; Fosamax; Losartan; and Nitrofurantoin  Home Medications   Prior to Admission medications   Medication Sig Start Date End Date Taking? Authorizing Provider  acetaminophen (TYLENOL) 500 MG tablet Take 1,000 mg by mouth every 6 (six) hours as needed for mild pain or moderate pain.   Yes Historical Provider, MD  ALPRAZolam Duanne Moron) 0.5 MG tablet Take 1.5 tablets (0.75 mg total) by mouth at bedtime as needed for anxiety. 04/04/15  Yes Kathrynn Ducking, MD  amLODipine (NORVASC) 10 MG tablet TAKE ONE TABLET BY MOUTH ONCE DAILY 03/05/15  Yes Fayrene Helper, MD  atorvastatin (LIPITOR) 20 MG tablet Take 1 tablet by mouth daily. 12/20/14  Yes Historical Provider, MD  busPIRone (BUSPAR) 5 MG tablet TAKE ONE TABLET BY MOUTH TWICE DAILY 11/25/14  Yes Fayrene Helper, MD  cetirizine (ZYRTEC) 10 MG tablet Take 10 mg by mouth daily.   Yes Historical Provider, MD  donepezil (ARICEPT) 10 MG tablet Take 1 tablet (10 mg total) by mouth at bedtime. 02/17/15  Yes Fayrene Helper, MD  ELIQUIS 5 MG TABS tablet TAKE ONE TABLET BY MOUTH TWICE DAILY 03/05/15  Yes Kathrynn Ducking, MD  gabapentin (NEURONTIN) 100 MG capsule One capsule in the morning and midday, 3 capsules at bedtime Patient taking differently: Take 100-300 mg by mouth See admin  instructions. One capsule in the morning and midday, 3 capsules at bedtime 01/07/15  Yes Kathrynn Ducking, MD  HYDROcodone-acetaminophen (NORCO/VICODIN) 5-325 MG tablet Take 1 tablet by mouth every 6 (six) hours as needed for moderate pain. 04/17/15  Yes Carole Civil, MD  levETIRAcetam (KEPPRA) 750 MG tablet Take 1 tablet (750 mg total) by mouth 2 (two) times daily. 03/10/15  Yes Kathrynn Ducking, MD  metoprolol (LOPRESSOR) 50 MG tablet TAKE ONE-HALF TABLET BY MOUTH THREE TIMES DAILY 04/16/15  Yes  Fayrene Helper, MD  mirtazapine (REMERON) 45 MG tablet Take 1 tablet (45 mg total) by mouth at bedtime. 11/20/14  Yes Kathrynn Ducking, MD  montelukast (SINGULAIR) 10 MG tablet TAKE ONE TABLET BY MOUTH ONCE DAILY AT BEDTIME 04/16/15  Yes Fayrene Helper, MD  polyethylene glycol [facility] / GLYCOLAX) packet Take 17 g by mouth daily. 05/05/15  Yes Carole Civil, MD  silver sulfADIAZINE (SILVADENE) 1 % cream Apply 1 application topically daily. 05/08/15  Yes Fayrene Helper, MD  Vitamin D, Ergocalciferol, (DRISDOL) 50000 UNITS CAPS capsule Take 1 capsule (50,000 Units total) by mouth every 7 (seven) days. 02/24/15  Yes Fayrene Helper, MD  ciprofloxacin (CIPRO) 500 MG tablet Take 1 tablet (500 mg total) by mouth 2 (two) times daily. 05/26/15   Davonna Belling, MD  esomeprazole ([place]) 20 MG packet Take 20 mg by mouth daily before breakfast. Patient not taking: Reported on 05/26/2015 03/31/15   Fayrene Helper, MD  traMADol Veat[facility]) 100 MG 24 hr tablet Take 1 tablet (100 mg total) by mouth at bedtime. Patient not taking: Reported on 05/26/2015 04/04/15   Kathrynn Ducking, MD  zolpidem (AMBIEN) 5 MG tablet Take 1 tablet (5 mg total) by mouth at bedtime as needed for sleep. Patient not taking: Reported on 04/04/2015 04/01/15   Kathrynn Ducking, MD   BP 140/69 mmHg  Pulse 80  Temp(Src) 98 F (36.7 C) (Oral)  Resp 18  Ht 5\' 5"  (1.651 m)  Wt 122 lb (55.339 kg)  BMI 20.30 kg/m2  SpO2  96% Physical Exam  Constitutional: She is oriented to person, place, and time. She appears well-developed and well-nourished. No distress.  HENT:  Head: Normocephalic and atraumatic.  Eyes: EOM are normal.  Neck: Neck supple.  Cardiovascular: Normal rate.   Pulmonary/Chest: Effort normal. No respiratory distress.  Abdominal: Soft. There is tenderness in the suprapubic area. There is no CVA tenderness.  Suprapubic tenderness and fullness.   Musculoskeletal: Normal range of motion.  Kyphosis. No cervical, thoracic, or lumbar midline tenderness  Neurological: She is alert and oriented to person, place, and time.  Skin: Skin is warm and dry.  Psychiatric: She has a normal mood and affect. Her behavior is normal.  Nursing note and vitals reviewed.   ED Course  Procedures (including critical care time) COORDINATION OF CARE: 1:01 PM Discussed treatment plan with pt at bedside which includes labs and pt agreed to plan.   Results for orders placed or performed during the hospital encounter of 05/26/15  Urine culture  Result Value Ref Range   Specimen Description URINE, CATHETERIZED    Special Requests NONE    Culture      >=100,000 COLONIES/mL GRAM NEGATIVE RODS CULTURE REINCUBATED FOR BETTER GROWTH Performed at [facility]    Report Status PENDING   Basic metabolic panel  Result Value Ref Range   Sodium 143 135 - 145 mmol/L   Potassium 3.4 (L) 3.5 - 5.1 mmol/L   Chloride 106 101 - 111 mmol/L   CO2 31 22 - 32 mmol/L   Glucose, Bld 82 65 - 99 mg/dL   BUN 11 6 - 20 mg/dL   Creatinine, Ser 0.64 0.44 - 1.00 mg/dL   Calcium 8.9 8.9 - 10.3 mg/dL   GFR calc non Af Amer >60 >60 mL/min   GFR calc Af Amer >60 >60 mL/min   Anion gap 6 5 - 15  Urinalysis, Routine w reflex microscopic  Result Value Ref Range  Color, Urine STRAW (A) YELLOW   APPearance CLEAR CLEAR   Specific Gravity, Urine 1.010 1.005 - 1.030   pH 6.0 5.0 - 8.0   Glucose, UA NEGATIVE NEGATIVE mg/dL   Hgb  urine dipstick TRACE (A) NEGATIVE   Bilirubin Urine NEGATIVE NEGATIVE   Ketones, ur NEGATIVE NEGATIVE mg/dL   Protein, ur NEGATIVE NEGATIVE mg/dL   Urobilinogen, UA 0.2 0.0 - 1.0 mg/dL   Nitrite POSITIVE (A) NEGATIVE   Leukocytes, UA TRACE (A) NEGATIVE  CBC with Differential  Result Value Ref Range   WBC 6.9 4.0 - 10.5 K/uL   RBC 3.94 3.87 - 5.11 MIL/uL   Hemoglobin 12.9 12.0 - 15.0 g/dL   HCT 40.2 36.0 - 46.0 %   MCV 102.0 (H) 78.0 - 100.0 fL   MCH 32.7 26.0 - 34.0 pg   MCHC 32.1 30.0 - 36.0 g/dL   RDW 13.2 11.5 - 15.5 %   Platelets 231 150 - 400 K/uL   Neutrophils Relative % 58 %   Neutro Abs 4.0 1.7 - 7.7 K/uL   Lymphocytes Relative 32 %   Lymphs Abs 2.2 0.7 - 4.0 K/uL   Monocytes Relative 6 %   Monocytes Absolute 0.4 0.1 - 1.0 K/uL   Eosinophils Relative 4 %   Eosinophils Absolute 0.3 0.0 - 0.7 K/uL   Basophils Relative 0 %   Basophils Absolute 0.0 0.0 - 0.1 K/uL  Urine microscopic-add on  Result Value Ref Range   Squamous Epithelial / LPF RARE RARE   WBC, UA 7-10 <3 WBC/hpf   RBC / HPF 3-6 <3 RBC/hpf   Bacteria, UA MANY (A) RARE   No results found.  I have personally reviewed and evaluated these images and lab results as part of my medical decision-making.   EKG Interpretation None      MDM   Final diagnoses:  Acute cystitis without hematuria  Urinary retention    Patient presents with urinary tract infection. Found to have urinary retention also. Foley placed. Antibiotics given. Culture sent. Discharged to follow-up with PCP and urology.  I personally performed the services described in this documentation, which was scribed in my presence. The recorded information has been reviewed and is accurate.      Davonna Belling, MD 05/27/15 2215

## 2015-05-26 NOTE — Telephone Encounter (Signed)
Spoke with patient and she states that she is going to the ED because she is unable to void at all.  No prescription sent from this office until noted ED visit.  Will continue to follow.

## 2015-05-26 NOTE — Telephone Encounter (Signed)
Send cipro 500 mg one twice daily for 3 days, aFTER specimen sent to lab pls

## 2015-05-26 NOTE — Discharge Instructions (Signed)
Urinary Tract Infection Urinary tract infections (UTIs) can develop anywhere along your urinary tract. Your urinary tract is your body's drainage system for removing wastes and extra water. Your urinary tract includes two kidneys, two ureters, a bladder, and a urethra. Your kidneys are a pair of bean-shaped organs. Each kidney is about the size of your fist. They are located below your ribs, one on each side of your spine. CAUSES Infections are caused by microbes, which are microscopic organisms, including fungi, viruses, and bacteria. These organisms are so small that they can only be seen through a microscope. Bacteria are the microbes that most commonly cause UTIs. SYMPTOMS  Symptoms of UTIs may vary by age and gender of the patient and by the location of the infection. Symptoms in young women typically include a frequent and intense urge to urinate and a painful, burning feeling in the bladder or urethra during urination. Older women and men are more likely to be tired, shaky, and weak and have muscle aches and abdominal pain. A fever may mean the infection is in your kidneys. Other symptoms of a kidney infection include pain in your back or sides below the ribs, nausea, and vomiting. DIAGNOSIS To diagnose a UTI, your caregiver will ask you about your symptoms. Your caregiver will also ask you to provide a urine sample. The urine sample will be tested for bacteria and white blood cells. White blood cells are made by your body to help fight infection. TREATMENT  Typically, UTIs can be treated with medication. Because most UTIs are caused by a bacterial infection, they usually can be treated with the use of antibiotics. The choice of antibiotic and length of treatment depend on your symptoms and the type of bacteria causing your infection. HOME CARE INSTRUCTIONS  If you were prescribed antibiotics, take them exactly as your caregiver instructs you. Finish the medication even if you feel better after  you have only taken some of the medication.  Drink enough water and fluids to keep your urine clear or pale yellow.  Avoid caffeine, tea, and carbonated beverages. They tend to irritate your bladder.  Empty your bladder often. Avoid holding urine for long periods of time.  Empty your bladder before and after sexual intercourse.  After a bowel movement, women should cleanse from front to back. Use each tissue only once. SEEK MEDICAL CARE IF:   You have back pain.  You develop a fever.  Your symptoms do not begin to resolve within 3 days. SEEK IMMEDIATE MEDICAL CARE IF:   You have severe back pain or lower abdominal pain.  You develop chills.  You have nausea or vomiting.  You have continued burning or discomfort with urination. MAKE SURE YOU:   Understand these instructions.  Will watch your condition.  Will get help right away if you are not doing well or get worse.   This information is not intended to replace advice given to you by your health care provider. Make sure you discuss any questions you have with your health care provider.   Document Released: 04/07/2005 Document Revised: 03/19/2015 Document Reviewed: 08/06/2011 Elsevier Interactive Patient Education 2016 [place].  Acute Urinary Retention, Female Acute urinary retention is the temporary inability to urinate. This is an uncommon problem in women. It can be caused by:  Infection.  A side effect of a medicine.  A problem in a nearby organ that presses or squeezes on the bladder or the urethra (the tube that drains the bladder).  Psychological problems.  Surgery on your bladder, urethra, or pelvic organs that causes obstruction to the outflow of urine from your bladder. HOME CARE INSTRUCTIONS  If you are sent home with a Foley catheter and a drainage system, you will need to discuss the best course of action with your health care provider. While the catheter is in, maintain a good intake of fluids.  Keep the drainage bag emptied and lower than your catheter. This is so that contaminated urine will not flow back into your bladder, which could lead to a urinary tract infection. There are two main types of drainage bags. One is a large bag that usually is used at night. It has a good capacity that will allow you to sleep through the night without having to empty it. The second type is called a leg bag. It has a smaller capacity so it needs to be emptied more frequently. However, the main advantage is that it can be attached by a leg strap and goes underneath your clothing, allowing you the freedom to move about or leave your home. Only take over-the-counter or prescription medicines for pain, discomfort, or fever as directed by your health care provider.  SEEK MEDICAL CARE IF:  You develop a low-grade fever.  You experience spasms or leakage of urine with the spasms. SEEK IMMEDIATE MEDICAL CARE IF:   You develop chills or fever.  Your catheter stops draining urine.  Your catheter falls out.  You start to develop increased bleeding that does not respond to rest and increased fluid intake. MAKE SURE YOU:  Understand these instructions.  Will watch your condition.  Will get help right away if you are not doing well or get worse.   This information is not intended to replace advice given to you by your health care provider. Make sure you discuss any questions you have with your health care provider.   Document Released: 06/27/2006 Document Revised: 11/12/2014 Document Reviewed: 12/07/2012 Elsevier Interactive Patient Education [employer].

## 2015-05-26 NOTE — ED Notes (Signed)
MD at bedside. 

## 2015-05-26 NOTE — ED Notes (Signed)
UTI back in September, pt c/o back pain and voiding small amounts, chills per family member

## 2015-05-27 ENCOUNTER — Other Ambulatory Visit: Payer: Self-pay | Admitting: *Deleted

## 2015-05-27 NOTE — Patient Outreach (Signed)
[name]) Care Management  05/27/2015  Erin [name] [facility] 19-May-1930 [id number]  I reached out to Mr. Erin [name], son/HCPOA/primary caregiver for Erin [name] today to follow up on her ED visit. Erin [name] was discharged to home with foley catheter and recommendation to follow up with urology. She has a urologist appointment in the morning and Erin [name]'s son, daughter in law, and grandson were all instructed on foley care. In addition, Erin [name] now has a CNA who comes 3 times weekly for personal care and who is performing activities such as bathing, dressing. Erin [name]'s CNA was formerly employed at a [employer] and thus far, Erin [name] reports his mother is very pleased with the care provided.   I am available to assist with urology follow up after tomorrow's appointment. I have a face to face appointment scheduled with Erin [name] on Thursday at 11:30am.    [facility]  [phone]

## 2015-05-28 ENCOUNTER — Other Ambulatory Visit: Payer: Self-pay

## 2015-05-28 ENCOUNTER — Ambulatory Visit (INDEPENDENT_AMBULATORY_CARE_PROVIDER_SITE_OTHER): Payer: Medicare Other | Admitting: Urology

## 2015-05-28 DIAGNOSIS — N3020 Other chronic cystitis without hematuria: Secondary | ICD-10-CM

## 2015-05-28 DIAGNOSIS — N39490 Overflow incontinence: Secondary | ICD-10-CM

## 2015-05-28 DIAGNOSIS — Z9289 Personal history of other medical treatment: Secondary | ICD-10-CM

## 2015-05-28 MED ORDER — SILVER SULFADIAZINE 1 % EX CREA: 1.0000 "application " | TOPICAL_CREAM | Freq: Every day | CUTANEOUS | Status: DC

## 2015-05-29 ENCOUNTER — Ambulatory Visit: Payer: Self-pay | Admitting: *Deleted

## 2015-05-29 ENCOUNTER — Encounter (HOSPITAL_COMMUNITY): Payer: Self-pay | Admitting: Emergency Medicine

## 2015-05-29 ENCOUNTER — Encounter: Payer: Self-pay | Admitting: Orthopedic Surgery

## 2015-05-29 ENCOUNTER — Emergency Department (HOSPITAL_COMMUNITY)
Admission: EM | Admit: 2015-05-29 | Discharge: 2015-05-29 | Disposition: A | Discharge status: Home / Self Care | Payer: Medicare Other | Attending: Emergency Medicine | Admitting: Emergency Medicine

## 2015-05-29 ENCOUNTER — Emergency Department (HOSPITAL_COMMUNITY): Payer: Medicare Other

## 2015-05-29 ENCOUNTER — Telehealth: Payer: Self-pay | Admitting: *Deleted

## 2015-05-29 ENCOUNTER — Other Ambulatory Visit: Payer: Self-pay | Admitting: *Deleted

## 2015-05-29 DIAGNOSIS — Z9049 Acquired absence of other specified parts of digestive tract: Secondary | ICD-10-CM | POA: Insufficient documentation

## 2015-05-29 DIAGNOSIS — E785 Hyperlipidemia, unspecified: Secondary | ICD-10-CM | POA: Insufficient documentation

## 2015-05-29 DIAGNOSIS — G40909 Epilepsy, unspecified, not intractable, without status epilepticus: Secondary | ICD-10-CM | POA: Insufficient documentation

## 2015-05-29 DIAGNOSIS — Z8673 Personal history of transient ischemic attack (TIA), and cerebral infarction without residual deficits: Secondary | ICD-10-CM | POA: Diagnosis not present

## 2015-05-29 DIAGNOSIS — R531 Weakness: Secondary | ICD-10-CM | POA: Diagnosis not present

## 2015-05-29 DIAGNOSIS — M1990 Unspecified osteoarthritis, unspecified site: Secondary | ICD-10-CM | POA: Diagnosis not present

## 2015-05-29 DIAGNOSIS — N390 Urinary tract infection, site not specified: Secondary | ICD-10-CM | POA: Diagnosis not present

## 2015-05-29 DIAGNOSIS — E669 Obesity, unspecified: Secondary | ICD-10-CM | POA: Insufficient documentation

## 2015-05-29 DIAGNOSIS — I4891 Unspecified atrial fibrillation: Secondary | ICD-10-CM | POA: Insufficient documentation

## 2015-05-29 DIAGNOSIS — K219 Gastro-esophageal reflux disease without esophagitis: Secondary | ICD-10-CM | POA: Diagnosis not present

## 2015-05-29 DIAGNOSIS — Z79899 Other long term (current) drug therapy: Secondary | ICD-10-CM | POA: Insufficient documentation

## 2015-05-29 DIAGNOSIS — G2581 Restless legs syndrome: Secondary | ICD-10-CM | POA: Diagnosis not present

## 2015-05-29 DIAGNOSIS — Z90710 Acquired absence of both cervix and uterus: Secondary | ICD-10-CM | POA: Diagnosis not present

## 2015-05-29 DIAGNOSIS — F419 Anxiety disorder, unspecified: Secondary | ICD-10-CM | POA: Diagnosis not present

## 2015-05-29 DIAGNOSIS — R109 Unspecified abdominal pain: Secondary | ICD-10-CM | POA: Diagnosis present

## 2015-05-29 DIAGNOSIS — G629 Polyneuropathy, unspecified: Secondary | ICD-10-CM | POA: Insufficient documentation

## 2015-05-29 DIAGNOSIS — Z87440 Personal history of urinary (tract) infections: Secondary | ICD-10-CM | POA: Insufficient documentation

## 2015-05-29 DIAGNOSIS — R079 Chest pain, unspecified: Secondary | ICD-10-CM | POA: Diagnosis not present

## 2015-05-29 DIAGNOSIS — I10 Essential (primary) hypertension: Secondary | ICD-10-CM | POA: Diagnosis not present

## 2015-05-29 DIAGNOSIS — Z792 Long term (current) use of antibiotics: Secondary | ICD-10-CM | POA: Insufficient documentation

## 2015-05-29 DIAGNOSIS — H9190 Unspecified hearing loss, unspecified ear: Secondary | ICD-10-CM | POA: Insufficient documentation

## 2015-05-29 LAB — CBC WITH DIFFERENTIAL/PLATELET
BASOS PCT: 0 %
Basophils Absolute: 0 10*3/uL (ref 0.0–0.1)
EOS PCT: 4 %
Eosinophils Absolute: 0.3 10*3/uL (ref 0.0–0.7)
HEMATOCRIT: 41.7 % (ref 36.0–46.0)
Hemoglobin: 13.6 g/dL (ref 12.0–15.0)
Lymphocytes Relative: 30 %
Lymphs Abs: 1.8 10*3/uL (ref 0.7–4.0)
MCH: 32.8 pg (ref 26.0–34.0)
MCHC: 32.6 g/dL (ref 30.0–36.0)
MCV: 100.5 fL — AB (ref 78.0–100.0)
MONO ABS: 0.5 10*3/uL (ref 0.1–1.0)
MONOS PCT: 8 %
NEUTROS ABS: 3.5 10*3/uL (ref 1.7–7.7)
Neutrophils Relative %: 58 %
Platelets: 231 10*3/uL (ref 150–400)
RBC: 4.15 MIL/uL (ref 3.87–5.11)
RDW: 12.5 % (ref 11.5–15.5)
WBC: 6.1 10*3/uL (ref 4.0–10.5)

## 2015-05-29 LAB — COMPREHENSIVE METABOLIC PANEL
ALT: 9 U/L — ABNORMAL LOW (ref 14–54)
ANION GAP: 8 (ref 5–15)
AST: 20 U/L (ref 15–41)
Albumin: 3.6 g/dL (ref 3.5–5.0)
Alkaline Phosphatase: 68 U/L (ref 38–126)
BILIRUBIN TOTAL: 0.4 mg/dL (ref 0.3–1.2)
BUN: 11 mg/dL (ref 6–20)
CO2: 29 mmol/L (ref 22–32)
Calcium: 9 mg/dL (ref 8.9–10.3)
Chloride: 106 mmol/L (ref 101–111)
Creatinine, Ser: 0.6 mg/dL (ref 0.44–1.00)
GFR calc Af Amer: >60 mL/min (ref >60)
Glucose, Bld: 108 mg/dL — ABNORMAL HIGH (ref 65–99)
POTASSIUM: 3.8 mmol/L (ref 3.5–5.1)
Sodium: 143 mmol/L (ref 135–145)
TOTAL PROTEIN: 6.8 g/dL (ref 6.5–8.1)

## 2015-05-29 LAB — URINALYSIS, ROUTINE W REFLEX MICROSCOPIC
Bilirubin Urine: NEGATIVE
Glucose, UA: NEGATIVE mg/dL
Ketones, ur: NEGATIVE mg/dL
NITRITE: NEGATIVE
PROTEIN: NEGATIVE mg/dL
Specific Gravity, Urine: 1.015 (ref 1.005–1.030)
pH: 7 (ref 5.0–8.0)

## 2015-05-29 LAB — URINE MICROSCOPIC-ADD ON

## 2015-05-29 LAB — URINE CULTURE: Culture: >=100000

## 2015-05-29 MED ORDER — HYDROCODONE-ACETAMINOPHEN 5-325 MG PO TABS: 1.0000 | ORAL_TABLET | Freq: Four times a day (QID) | ORAL | Status: DC | PRN

## 2015-05-29 MED ORDER — SODIUM CHLORIDE 0.9 % IV BOLUS (SEPSIS)
500.0000 mL | Freq: Once | INTRAVENOUS | Status: AC
  Administered 2015-05-29: 500 mL via INTRAVENOUS

## 2015-05-29 MED ORDER — DEXTROSE 5 % IV SOLN
1.0000 g | Freq: Once | INTRAVENOUS | Status: AC
  Administered 2015-05-29: 1 g via INTRAVENOUS
  Filled 2015-05-29: qty 10

## 2015-05-29 MED ORDER — CEPHALEXIN 500 MG PO CAPS: 500.0000 mg | ORAL_CAPSULE | Freq: Four times a day (QID) | ORAL | Status: DC

## 2015-05-29 NOTE — Telephone Encounter (Signed)
[name] Sproule called for patient requesting hydrocodone to be refilled Please advise [phone]

## 2015-05-29 NOTE — Telephone Encounter (Signed)
Prescription available, per Dr Aline Brochure fracture has healed, any further scripts will come from her PCP. Patient will be given a letter with last prescription.

## 2015-05-29 NOTE — ED Provider Notes (Signed)
CSN: [id number]     Arrival date & time 05/29/15  1804 History   First MD Initiated Contact with Patient 05/29/15 [place]     Chief Complaint  Patient presents with  . Weakness  . Abdominal Pain     (Consider location/radiation/quality/duration/timing/severity/associated sxs/prior Treatment) Patient is a 81 y.o. female presenting with weakness and abdominal pain. The history is provided by the patient (The patient complains of feeling bloated. She is being treated for UTI with Cipro).  Weakness This is a recurrent problem. The current episode started 2 days ago. The problem occurs constantly. The problem has not changed since onset.Associated symptoms include chest pain and abdominal pain. Pertinent negatives include no headaches. Nothing aggravates the symptoms.  Abdominal Pain Associated symptoms: chest pain   Associated symptoms: no cough, no diarrhea, no fatigue and no hematuria     Past Medical History  Diagnosis Date  . Anxiety   . Arthritis   . Hyperlipidemia   . Essential hypertension, benign   . Osteoporosis   . GERD (gastroesophageal reflux disease)   . Hearing loss   . Restless leg   . Tremor   . Leg edema   . Obese   . Gall bladder disease   . Cataract   . Carpal tunnel syndrome of left wrist   . Atrial fibrillation ([place])   . Sleep apnea   . Esophageal dilatation   . Frequent falls   . History of nuclear stress test 08/31/2012    Lexiscan cardiolite negative for ischemia  . Polyneuropathy in other diseases classified elsewhere ([place]) 10/03/2013  . Cardioembolic stroke ([place]) [id number]  . UTI (lower urinary tract infection)   . History of stroke with current residual effects 11/20/2014  . Seizures ([place])   . Focal motor seizure disorder ([place]) 12/24/2014    Left leg involvment  . Sequela, post-stroke 04/04/2015     Hemiparesis, nondominant hemisphere   Past Surgical History  Procedure Laterality Date  . Appendectomy    . Cholecystectomy    . Abdominal  hysterectomy    . Fracture surgery      Left arm x4  . Benign cyst removed from kidney and lung, bilateral mastectomy    . Bilateral great toenail removal    . Knee arthroscopy  2012    Right knee, Dr Aline Brochure  . Left forearm    . Breast surgery      Bilateral 2000  . Tonsillectomy    . R hand surgery    . Bronchoscopy  02/15/2000  . Right vats.  "  . Right upper lobe wedge resection.  "  . Upper gastrointestinal endoscopy  11/25/1999    Esophagitis/ Normal proximal esophagus, stomach and duodenum  . Colonoscopy  01/17/2009    Dr. Deatra Ina : Internal hemorrhoids/Diverticula, scattered in the ascending colon/  Moderate diverticulosis ascending colon to sigmoid colon  . Esophagogastroduodenoscopy   04/23/2003    Dr. Deatra Ina: HIATAL HERNIA  . Colonoscopy  01/16/2001    Normal  . Cataract extraction, bilateral    . Esophagogastroduodenoscopy  03/20/2012    [id number] ring-LIKELY CAUSING MILD DYSPHAGIA/SMALL hiatal hernia/Multiple sessile polyps ranging between 3-36mm , path benign  . Cataract extraction    . Carpal tunnel release      Left wrist  . Transthoracic echocardiogram  06/24/2009    EF=>55%, mild assymetric LVH; LA mildly dilated; mild mitral annular calcif, borderline MVP, mild-mod MR; mild-mod TR, RV systolic pressure elevated, mild pulm HTN; AV mildly sclerotic; mild pulm valve regurg -  ordered r/t bradycardia   . Endovenous ablation saphenous vein w/ laser  01/2011    Right GSV  . Cholecystectomy    . Intramedullary (im) nail intertrochanteric Left 03/25/2014    Procedure: INTRAMEDULLARY NAIL INTERTROCHANTRIC LEFT HIP;  Surgeon: Marianna Payment, MD;  Location: [facility];  Service: Orthopedics;  Laterality: Left;   Family History  Problem Relation Age of Onset  . Diabetes Mother   . Heart disease Mother   . Stroke Mother   . Cancer Sister     KIDNEY  . Emphysema Sister   . Heart disease Brother   . Heart disease Sister   . Emphysema Sister   . Cancer Sister      BREAST  . Heart disease Brother   . Colon cancer Sister   . Alzheimer's disease Father   . Heart disease Sister   . Tremor Sister   . Tremor Brother   . Alcoholism Child   . Cancer Brother   . Pancreatic cancer Brother   . Diabetes Son    Social History  Substance Use Topics  . Smoking status: Never Smoker   . Smokeless tobacco: Never Used  . Alcohol Use: No   OB History    No data available     Review of Systems  Constitutional: Negative for appetite change and fatigue.  HENT: Negative for congestion, ear discharge and sinus pressure.   Eyes: Negative for discharge.  Respiratory: Negative for cough.   Cardiovascular: Positive for chest pain.  Gastrointestinal: Positive for abdominal pain. Negative for diarrhea.  Genitourinary: Negative for frequency and hematuria.  Musculoskeletal: Negative for back pain.  Skin: Negative for rash.  Neurological: Positive for weakness. Negative for seizures and headaches.  Psychiatric/Behavioral: Negative for hallucinations.      Allergies  Codeine; Morphine and related; Actonel; Fosamax; Losartan; and Nitrofurantoin  Home Medications   Prior to Admission medications   Medication Sig Start Date End Date Taking? Authorizing Provider  acetaminophen (TYLENOL) 500 MG tablet Take 1,000 mg by mouth every 6 (six) hours as needed for mild pain or moderate pain.   Yes Historical Provider, MD  ALPRAZolam Duanne Moron) 0.5 MG tablet Take 1.5 tablets (0.75 mg total) by mouth at bedtime as needed for anxiety. 04/04/15  Yes Kathrynn Ducking, MD  amLODipine (NORVASC) 10 MG tablet TAKE ONE TABLET BY MOUTH ONCE DAILY 03/05/15  Yes Fayrene Helper, MD  atorvastatin (LIPITOR) 20 MG tablet Take 1 tablet by mouth daily. 12/20/14  Yes Historical Provider, MD  busPIRone (BUSPAR) 5 MG tablet TAKE ONE TABLET BY MOUTH TWICE DAILY 11/25/14  Yes Fayrene Helper, MD  cetirizine (ZYRTEC) 10 MG tablet Take 10 mg by mouth daily.   Yes Historical Provider, MD   ciprofloxacin (CIPRO) 500 MG tablet Take 1 tablet (500 mg total) by mouth 2 (two) times daily. 05/26/15  Yes Davonna Belling, MD  donepezil (ARICEPT) 10 MG tablet Take 1 tablet (10 mg total) by mouth at bedtime. 02/17/15  Yes Fayrene Helper, MD  ELIQUIS 5 MG TABS tablet TAKE ONE TABLET BY MOUTH TWICE DAILY 03/05/15  Yes Kathrynn Ducking, MD  gabapentin (NEURONTIN) 100 MG capsule One capsule in the morning and midday, 3 capsules at bedtime Patient taking differently: Take 100-300 mg by mouth See admin instructions. One capsule in the morning and midday, 3 capsules at bedtime 01/07/15  Yes Kathrynn Ducking, MD  HYDROcodone-acetaminophen (NORCO/VICODIN) 5-325 MG tablet Take 1 tablet by mouth every 6 (six) hours as needed for  moderate pain. 05/29/15  Yes Carole Civil, MD  levETIRAcetam (KEPPRA) 750 MG tablet Take 1 tablet (750 mg total) by mouth 2 (two) times daily. 03/10/15  Yes Kathrynn Ducking, MD  metoprolol (LOPRESSOR) 50 MG tablet TAKE ONE-HALF TABLET BY MOUTH THREE TIMES DAILY 04/16/15  Yes Fayrene Helper, MD  mirtazapine (REMERON) 45 MG tablet Take 1 tablet (45 mg total) by mouth at bedtime. 11/20/14  Yes Kathrynn Ducking, MD  montelukast (SINGULAIR) 10 MG tablet TAKE ONE TABLET BY MOUTH ONCE DAILY AT BEDTIME 04/16/15  Yes Fayrene Helper, MD  polyethylene glycol [facility] / GLYCOLAX) packet Take 17 g by mouth daily. Patient taking differently: Take 17 g by mouth daily as needed for mild constipation.  05/05/15  Yes Carole Civil, MD  silver sulfADIAZINE (SILVADENE) 1 % cream Apply 1 application topically daily. 05/28/15  Yes Fayrene Helper, MD  Vitamin D, Ergocalciferol, (DRISDOL) 50000 UNITS CAPS capsule Take 1 capsule (50,000 Units total) by mouth every 7 (seven) days. 02/24/15  Yes Fayrene Helper, MD  cephALEXin (KEFLEX) 500 MG capsule Take 1 capsule (500 mg total) by mouth 4 (four) times daily. 05/29/15   Milton Ferguson, MD  esomeprazole (NEXIUM) 20 MG packet Take 20 mg  by mouth daily before breakfast. Patient not taking: Reported on 05/26/2015 03/31/15   Fayrene Helper, MD  traMADol Veatrice Bourbon ER) 100 MG 24 hr tablet Take 1 tablet (100 mg total) by mouth at bedtime. Patient not taking: Reported on 05/26/2015 04/04/15   Kathrynn Ducking, MD  zolpidem (AMBIEN) 5 MG tablet Take 1 tablet (5 mg total) by mouth at bedtime as needed for sleep. Patient not taking: Reported on 04/04/2015 04/01/15   Kathrynn Ducking, MD   BP 143/67 mmHg  Pulse 73  Temp(Src) 97.8 F (36.6 C) (Oral)  Resp 18  Ht 5' (1.524 m)  Wt 122 lb (55.339 kg)  BMI 23.83 kg/m2  SpO2 94% Physical Exam  Constitutional: She is oriented to person, place, and time. She appears well-developed.  HENT:  Head: Normocephalic.  Eyes: Conjunctivae and EOM are normal. No scleral icterus.  Neck: Neck supple. No thyromegaly present.  Cardiovascular: Normal rate and regular rhythm.  Exam reveals no gallop and no friction rub.   No murmur heard. Pulmonary/Chest: No stridor. She has no wheezes. She has no rales. She exhibits no tenderness.  Abdominal: She exhibits no distension. There is no tenderness. There is no rebound.  Musculoskeletal: Normal range of motion. She exhibits no edema.  Lymphadenopathy:    She has no cervical adenopathy.  Neurological: She is oriented to person, place, and time. She exhibits normal muscle tone. Coordination normal.  Skin: No rash noted. No erythema.  Psychiatric: She has a normal mood and affect. Her behavior is normal.    ED Course  Procedures (including critical care time) Labs Review Labs Reviewed  CBC WITH DIFFERENTIAL/PLATELET - Abnormal; Notable for the following:    MCV 100.5 (*)    All other components within normal limits  COMPREHENSIVE METABOLIC PANEL - Abnormal; Notable for the following:    Glucose, Bld 108 (*)    ALT 9 (*)    All other components within normal limits  URINALYSIS, ROUTINE W REFLEX MICROSCOPIC (NOT AT [facility]) - Abnormal; Notable for the  following:    APPearance TURBID (*)    Hgb urine dipstick LARGE (*)    Leukocytes, UA SMALL (*)    All other components within normal limits  URINE MICROSCOPIC-ADD  ON - Abnormal; Notable for the following:    Squamous Epithelial / LPF 0-5 (*)    Bacteria, UA FEW (*)    Crystals CA OXALATE CRYSTALS (*)    All other components within normal limits    Imaging Review Dg Abd Acute W/chest  05/29/2015  CLINICAL DATA:  Weakness and right-sided flank pain EXAM: DG ABDOMEN ACUTE W/ 1V CHEST COMPARISON:  04/22/2015 FINDINGS: Cardiac shadow is mildly enlarged. The left lung is clear. Small chronic right-sided pleural effusion is noted. Old left humeral fracture is seen. Scattered large and small bowel gas is noted without obstructive change. Fecal material is noted throughout the colon. No acute bony abnormality is seen. Postoperative changes in the proximal left femur are noted. IMPRESSION: No acute abnormality is noted. Electronically Signed   By: Inez Catalina M.D.   On: 05/29/2015 19:45   I have personally reviewed and evaluated these images and lab results as part of my medical decision-making.   EKG Interpretation None      MDM   Final diagnoses:  UTI (lower urinary tract infection)    Patient with UTI. Urine culture done from 3 days ago shows urinary tract infection with resistant Cipro. Patient was put on Cipro. Labs unremarkable. Patient given Rocephin IV and will be put on Keflex she has an appointment tomorrow with urologist    Milton Ferguson, MD 05/29/15 2141

## 2015-05-29 NOTE — ED Notes (Signed)
MD at bedside. 

## 2015-05-29 NOTE — ED Notes (Signed)
Per EMS, pt sent by family due to generalized weakness and pt c/o abd pain. Pt states she is drinking large amounts of fluid, but foley output is 300 ml for today. Per pt she states she has been N/, and feeling that her "bladder was about to burst". Pt has A fib and hx of previous stroke with left sided weakness, pt also has right sided arm tremor.

## 2015-05-29 NOTE — Telephone Encounter (Signed)
Patient's grandson (designated contact) (Dale's son) picked up prescription and letter.

## 2015-05-29 NOTE — Telephone Encounter (Signed)
Called to notify; spoke with sitter/caregiver; states patient is asleep, and son and grandson are not available.  Will relay message to call back to our office.

## 2015-05-29 NOTE — Discharge Instructions (Signed)
Follow up with dr. Jeffie Pollock tomorrow as planned

## 2015-05-30 ENCOUNTER — Telehealth (HOSPITAL_COMMUNITY): Payer: Self-pay

## 2015-05-30 ENCOUNTER — Ambulatory Visit: Payer: Self-pay | Admitting: Urology

## 2015-05-30 ENCOUNTER — Other Ambulatory Visit: Payer: Self-pay | Admitting: *Deleted

## 2015-05-30 ENCOUNTER — Ambulatory Visit: Payer: Self-pay | Admitting: *Deleted

## 2015-05-30 NOTE — Patient Outreach (Signed)
[facility]) Care Management  05/30/2015  Macelyn Zulli [facility] [birth year] [id number]  Mrs. Helsley's grandson [name] called today to report that Mrs. Labrecque has a urology appointment (catheter removal) today. I will call next week and we will reschedule our home visit appointment.    [place] Management  [phone]

## 2015-05-30 NOTE — Telephone Encounter (Signed)
Post ED Visit - Positive Culture Follow-up  Culture report reviewed by antimicrobial stewardship pharmacist:  []  Elenor Quinones, Pharm.D. []  Heide Guile, Pharm.D., BCPS []  Parks Neptune, Pharm.D. []  Alycia Rossetti, Pharm.D., BCPS []  [place], Pharm.D., BCPS, AAHIVP []  Legrand Como, Pharm.D., BCPS, AAHIVP []  Milus Glazier, Pharm.D. []  Stephens November, [place].D. Bonnee Quin, Pharm.D.  Positive urine culture, >/= 100,000 colonies -> E Coli Treated with Ciprofloxacin,changed to Cephalexin 05/29/2015, organism sensitive to the same and no further patient follow-up is required at this time.  Dortha Kern 05/30/2015, 9:36 AM

## 2015-06-02 ENCOUNTER — Other Ambulatory Visit: Payer: Self-pay | Admitting: Licensed Clinical Social Worker

## 2015-06-02 ENCOUNTER — Other Ambulatory Visit: Payer: Self-pay | Admitting: Neurology

## 2015-06-02 ENCOUNTER — Other Ambulatory Visit: Payer: Self-pay | Admitting: Family Medicine

## 2015-06-02 ENCOUNTER — Encounter: Payer: Self-pay | Admitting: Licensed Clinical Social Worker

## 2015-06-02 ENCOUNTER — Other Ambulatory Visit: Payer: Self-pay | Admitting: *Deleted

## 2015-06-02 NOTE — Patient Outreach (Signed)
Assessment:  CSW called home phone number of client on 06/02/15 and spoke via phone with client on 06/02/15. CSW verified identity of client. Client and CSW spoke of current client needs.  CSW encouraged Sharina to speak with her son, [name], about skilled nursing facility support in the area. CSW encouraged [place] to also speak with admissions directors, as needed, at local skilled nursing facilities about skilled nursing facility placement options for client in the area.  CSW had previously contacted Irven Shelling, admissions director at [facility] facility, to discuss client placement needs. Client informed RN [name] that client would rather look at other skilled nursing facilities in the area for possible client placement. Client informed CSW that she understood that [facility] facility was considering client for possible admissions to that facility at present. However, she said she had not personally received call from admissions director at that facility.  [name], son of client, is [place] of Attorney for client and would be communicating with admissions directors at skilled nursing facilities in the area as needed for client.  Client said she is receiving in home support as scheduled three days weekly with home health aides.  She said she is eating well and sleeping well. She said she has her prescribed medications and is taking medications as prescribed.  CSW and client spoke of care plan goal for client. CSW encouraged client to follow current care plan for client.  CSW gave client the [facility] phone number of [phone] and encouraged Nyx to call CSW as needed to discuss social work needs of client.  Plan: Client to continue to work towards current care plan goal for client. CSW to call client in three weeks to assess needs of client.  [name].Christle Nolting MSW, LCSW Licensed Clinical Social Worker [facility] Care Management [phone]
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
## 2015-06-04 ENCOUNTER — Emergency Department (HOSPITAL_COMMUNITY): Payer: Medicare Other

## 2015-06-04 ENCOUNTER — Emergency Department (HOSPITAL_COMMUNITY)
Admission: EM | Admit: 2015-06-04 | Discharge: 2015-06-04 | Disposition: A | Discharge status: Home / Self Care | Payer: Medicare Other | Attending: Emergency Medicine | Admitting: Emergency Medicine

## 2015-06-04 ENCOUNTER — Encounter (HOSPITAL_COMMUNITY): Payer: Self-pay | Admitting: Emergency Medicine

## 2015-06-04 DIAGNOSIS — Z8673 Personal history of transient ischemic attack (TIA), and cerebral infarction without residual deficits: Secondary | ICD-10-CM | POA: Insufficient documentation

## 2015-06-04 DIAGNOSIS — K219 Gastro-esophageal reflux disease without esophagitis: Secondary | ICD-10-CM | POA: Diagnosis not present

## 2015-06-04 DIAGNOSIS — Z880 Allergy status to penicillin: Secondary | ICD-10-CM | POA: Diagnosis not present

## 2015-06-04 DIAGNOSIS — F419 Anxiety disorder, unspecified: Secondary | ICD-10-CM | POA: Diagnosis not present

## 2015-06-04 DIAGNOSIS — H9190 Unspecified hearing loss, unspecified ear: Secondary | ICD-10-CM | POA: Diagnosis not present

## 2015-06-04 DIAGNOSIS — E785 Hyperlipidemia, unspecified: Secondary | ICD-10-CM | POA: Diagnosis not present

## 2015-06-04 DIAGNOSIS — Z7901 Long term (current) use of anticoagulants: Secondary | ICD-10-CM | POA: Diagnosis not present

## 2015-06-04 DIAGNOSIS — G2581 Restless legs syndrome: Secondary | ICD-10-CM | POA: Diagnosis not present

## 2015-06-04 DIAGNOSIS — M40209 Unspecified kyphosis, site unspecified: Secondary | ICD-10-CM | POA: Diagnosis not present

## 2015-06-04 DIAGNOSIS — M1990 Unspecified osteoarthritis, unspecified site: Secondary | ICD-10-CM | POA: Diagnosis not present

## 2015-06-04 DIAGNOSIS — N390 Urinary tract infection, site not specified: Secondary | ICD-10-CM | POA: Diagnosis not present

## 2015-06-04 DIAGNOSIS — E669 Obesity, unspecified: Secondary | ICD-10-CM | POA: Diagnosis not present

## 2015-06-04 DIAGNOSIS — I10 Essential (primary) hypertension: Secondary | ICD-10-CM | POA: Insufficient documentation

## 2015-06-04 DIAGNOSIS — I4891 Unspecified atrial fibrillation: Secondary | ICD-10-CM | POA: Insufficient documentation

## 2015-06-04 DIAGNOSIS — Z79899 Other long term (current) drug therapy: Secondary | ICD-10-CM | POA: Diagnosis not present

## 2015-06-04 DIAGNOSIS — Z792 Long term (current) use of antibiotics: Secondary | ICD-10-CM | POA: Insufficient documentation

## 2015-06-04 DIAGNOSIS — G40109 Localization-related (focal) (partial) symptomatic epilepsy and epileptic syndromes with simple partial seizures, not intractable, without status epilepticus: Secondary | ICD-10-CM | POA: Insufficient documentation

## 2015-06-04 DIAGNOSIS — R319 Hematuria, unspecified: Secondary | ICD-10-CM | POA: Diagnosis present

## 2015-06-04 LAB — COMPREHENSIVE METABOLIC PANEL
ALBUMIN: 3.6 g/dL (ref 3.5–5.0)
ALK PHOS: 106 U/L (ref 38–126)
ALT: 69 U/L — AB (ref 14–54)
AST: 119 U/L — AB (ref 15–41)
Anion gap: 7 (ref 5–15)
BUN: 15 mg/dL (ref 6–20)
CALCIUM: 9 mg/dL (ref 8.9–10.3)
CHLORIDE: 103 mmol/L (ref 101–111)
CO2: 33 mmol/L — AB (ref 22–32)
CREATININE: 0.64 mg/dL (ref 0.44–1.00)
GFR calc Af Amer: >60 mL/min (ref >60)
GFR calc non Af Amer: >60 mL/min (ref >60)
GLUCOSE: 94 mg/dL (ref 65–99)
Potassium: 3.8 mmol/L (ref 3.5–5.1)
SODIUM: 143 mmol/L (ref 135–145)
Total Bilirubin: 0.2 mg/dL — ABNORMAL LOW (ref 0.3–1.2)
Total Protein: 6.9 g/dL (ref 6.5–8.1)

## 2015-06-04 LAB — CBC WITH DIFFERENTIAL/PLATELET
BASOS ABS: 0 10*3/uL (ref 0.0–0.1)
Basophils Relative: 0 %
EOS ABS: 0.3 10*3/uL (ref 0.0–0.7)
Eosinophils Relative: 5 %
HCT: 38.3 % (ref 36.0–46.0)
HEMOGLOBIN: 12.5 g/dL (ref 12.0–15.0)
LYMPHS ABS: 1.7 10*3/uL (ref 0.7–4.0)
Lymphocytes Relative: 30 %
MCH: 33.1 pg (ref 26.0–34.0)
MCHC: 32.6 g/dL (ref 30.0–36.0)
MCV: 101.3 fL — ABNORMAL HIGH (ref 78.0–100.0)
Monocytes Absolute: 0.4 10*3/uL (ref 0.1–1.0)
Monocytes Relative: 7 %
NEUTROS PCT: 58 %
Neutro Abs: 3.2 10*3/uL (ref 1.7–7.7)
PLATELETS: 229 10*3/uL (ref 150–400)
RBC: 3.78 MIL/uL — AB (ref 3.87–5.11)
RDW: 13.3 % (ref 11.5–15.5)
WBC: 5.6 10*3/uL (ref 4.0–10.5)

## 2015-06-04 LAB — URINALYSIS, ROUTINE W REFLEX MICROSCOPIC
BILIRUBIN URINE: NEGATIVE
Glucose, UA: NEGATIVE mg/dL
Ketones, ur: NEGATIVE mg/dL
NITRITE: NEGATIVE
Protein, ur: NEGATIVE mg/dL
pH: 7 (ref 5.0–8.0)

## 2015-06-04 LAB — PROTIME-INR
INR: 1.41 (ref 0.00–1.49)
PROTHROMBIN TIME: 17.3 s — AB (ref 11.6–15.2)

## 2015-06-04 LAB — URINE MICROSCOPIC-ADD ON

## 2015-06-04 LAB — ACETAMINOPHEN LEVEL: Acetaminophen (Tylenol), Serum: <10 ug/mL — ABNORMAL LOW (ref 10–30)

## 2015-06-04 MED ORDER — OXYCODONE HCL 5 MG PO TABS
ORAL_TABLET | ORAL | Status: AC
  Filled 2015-06-04: qty 1

## 2015-06-04 MED ORDER — OXYCODONE HCL 5 MG PO TABS
5.0000 mg | ORAL_TABLET | Freq: Once | ORAL | Status: AC
  Administered 2015-06-04: 5 mg via ORAL

## 2015-06-04 NOTE — ED Notes (Signed)
Pt taken to CT.

## 2015-06-04 NOTE — ED Notes (Signed)
Per EMS: Pt reports being here twice in past 2 weeks.  Pt has blood in urine today and urine had blood present in the last two visits.  Pt has had foley since 05/26/15.   131/70, 64p, 18rr, 92% cold hands.

## 2015-06-04 NOTE — ED Notes (Signed)
Pt using bsc with assist.

## 2015-06-04 NOTE — ED Provider Notes (Signed)
CSN: [id number]     Arrival date & time 06/04/15  1724 History   First MD Initiated Contact with Patient 06/04/15 1729     Chief Complaint  Patient presents with  . Hematuria     (Consider location/radiation/quality/duration/timing/severity/associated sxs/prior Treatment) HPI Comments: Patient with history of previous stroke, atrial fibrillation aliquots presenting with hematuria in her Foley bag. She's had a Foley in place since November 14 when she was diagnosed with a UTI. She was taking Cipro and then switched to Keflex when the cultures came back. She has one more day of Keflex. The family became concerned today when his blood in the Foley catheter bag. Patient states she feels generally achy and sore but denies any abdominal pain or back pain. No nausea or vomiting. No fever. Good by mouth intake and urine output. She denies any chest pain or shortness of breath. She denies any dizziness or lightheadedness.  Patient is a 81 y.o. female presenting with hematuria. The history is provided by the patient and a relative.  Hematuria Pertinent negatives include no chest pain, no abdominal pain and no shortness of breath.    Past Medical History  Diagnosis Date  . Anxiety   . Arthritis   . Hyperlipidemia   . Essential hypertension, benign   . Osteoporosis   . GERD (gastroesophageal reflux disease)   . Hearing loss   . Restless leg   . Tremor   . Leg edema   . Obese   . Gall bladder disease   . Cataract   . Carpal tunnel syndrome of left wrist   . Atrial fibrillation ([place])   . Sleep apnea   . Esophageal dilatation   . Frequent falls   . History of nuclear stress test 08/31/2012    Lexiscan cardiolite negative for ischemia  . Polyneuropathy in other diseases classified elsewhere ([place]) 10/03/2013  . Cardioembolic stroke ([place]) [id number]  . UTI (lower urinary tract infection)   . History of stroke with current residual effects 11/20/2014  . Seizures ([place])   . Focal motor seizure  disorder ([place]) 12/24/2014    Left leg involvment  . Sequela, post-stroke 04/04/2015     Hemiparesis, nondominant hemisphere   Past Surgical History  Procedure Laterality Date  . Appendectomy    . Cholecystectomy    . Abdominal hysterectomy    . Fracture surgery      Left arm x4  . Benign cyst removed from kidney and lung, bilateral mastectomy    . Bilateral great toenail removal    . Knee arthroscopy  2012    Right knee, Dr Aline Brochure  . Left forearm    . Breast surgery      Bilateral 2000  . Tonsillectomy    . R hand surgery    . Bronchoscopy  02/15/2000  . Right vats.  "  . Right upper lobe wedge resection.  "  . Upper gastrointestinal endoscopy  11/25/1999    Esophagitis/ Normal proximal esophagus, stomach and duodenum  . Colonoscopy  01/17/2009    Dr. Deatra Ina : Internal hemorrhoids/Diverticula, scattered in the ascending colon/  Moderate diverticulosis ascending colon to sigmoid colon  . Esophagogastroduodenoscopy   04/23/2003    Dr. Deatra Ina: HIATAL HERNIA  . Colonoscopy  01/16/2001    Normal  . Cataract extraction, bilateral    . Esophagogastroduodenoscopy  03/20/2012    [id number] ring-LIKELY CAUSING MILD DYSPHAGIA/SMALL hiatal hernia/Multiple sessile polyps ranging between 3-23mm , path benign  . Cataract extraction    . Carpal  tunnel release      Left wrist  . Transthoracic echocardiogram  06/24/2009    EF=>55%, mild assymetric LVH; LA mildly dilated; mild mitral annular calcif, borderline MVP, mild-mod MR; mild-mod TR, RV systolic pressure elevated, mild pulm HTN; AV mildly sclerotic; mild pulm valve regurg - ordered r/t bradycardia   . Endovenous ablation saphenous vein w/ laser  01/2011    Right GSV  . Cholecystectomy    . Intramedullary (im) nail intertrochanteric Left 03/25/2014    Procedure: INTRAMEDULLARY NAIL INTERTROCHANTRIC LEFT HIP;  Surgeon: Marianna Payment, MD;  Location: [facility];  Service: Orthopedics;  Laterality: Left;   Family History  Problem  Relation Age of Onset  . Diabetes Mother   . Heart disease Mother   . Stroke Mother   . Cancer Sister     KIDNEY  . Emphysema Sister   . Heart disease Brother   . Heart disease Sister   . Emphysema Sister   . Cancer Sister     BREAST  . Heart disease Brother   . Colon cancer Sister   . Alzheimer's disease Father   . Heart disease Sister   . Tremor Sister   . Tremor Brother   . Alcoholism Child   . Cancer Brother   . Pancreatic cancer Brother   . Diabetes Son    Social History  Substance Use Topics  . Smoking status: Never Smoker   . Smokeless tobacco: Never Used  . Alcohol Use: No   OB History    No data available     Review of Systems  Constitutional: Positive for fatigue. Negative for activity change and appetite change.  HENT: Negative for congestion and rhinorrhea.   Respiratory: Negative for cough, chest tightness and shortness of breath.   Cardiovascular: Negative for chest pain.  Gastrointestinal: Negative for nausea, vomiting and abdominal pain.  Genitourinary: Positive for hematuria. Negative for dysuria, vaginal bleeding and vaginal discharge.  Musculoskeletal: Negative for myalgias, back pain and arthralgias.  Neurological: Negative for dizziness, weakness, light-headedness and numbness.  A complete 10 system review of systems was obtained and all systems are negative except as noted in the HPI and PMH.      Allergies  Codeine; Morphine and related; Actonel; Fosamax; Losartan; Nitrofurantoin; and Penicillins  Home Medications   Prior to Admission medications   Medication Sig Start Date End Date Taking? Authorizing Provider  acetaminophen (TYLENOL) 500 MG tablet Take 1,000 mg by mouth every 6 (six) hours as needed for mild pain or moderate pain.   Yes Historical Provider, MD  ALPRAZolam Duanne Moron) 0.5 MG tablet Take 1.5 tablets (0.75 mg total) by mouth at bedtime as needed for anxiety. 04/04/15  Yes Kathrynn Ducking, MD  amLODipine (NORVASC) 10 MG tablet  TAKE ONE TABLET BY MOUTH ONCE DAILY 03/05/15  Yes Fayrene Helper, MD  atorvastatin (LIPITOR) 20 MG tablet Take 1 tablet by mouth daily. 12/20/14  Yes Historical Provider, MD  busPIRone (BUSPAR) 5 MG tablet TAKE ONE TABLET BY MOUTH TWICE DAILY 11/25/14  Yes Fayrene Helper, MD  cephALEXin (KEFLEX) 500 MG capsule Take 1 capsule (500 mg total) by mouth 4 (four) times daily. 05/29/15  Yes Milton Ferguson, MD  cetirizine (ZYRTEC) 10 MG tablet Take 10 mg by mouth daily.   Yes Historical Provider, MD  zolpidem (AMBIEN) 5 MG tablet Take 1 tablet (5 mg total) by mouth at bedtime as needed for sleep. 04/01/15  Yes Kathrynn Ducking, MD  ciprofloxacin (CIPRO) 500 MG tablet  Take 1 tablet (500 mg total) by mouth 2 (two) times daily. 05/26/15   Davonna Belling, MD  donepezil (ARICEPT) 10 MG tablet Take 1 tablet (10 mg total) by mouth at bedtime. 02/17/15   Fayrene Helper, MD  ELIQUIS 5 MG TABS tablet TAKE ONE TABLET BY MOUTH TWICE DAILY 03/05/15   Kathrynn Ducking, MD  esomeprazole (NEXIUM) 20 MG packet Take 20 mg by mouth daily before breakfast. 03/31/15   Fayrene Helper, MD  gabapentin (NEURONTIN) 100 MG capsule One capsule in the morning and midday, 3 capsules at bedtime Patient taking differently: Take 100-300 mg by mouth See admin instructions. One capsule in the morning and midday, 3 capsules at bedtime 01/07/15   Kathrynn Ducking, MD  HYDROcodone-acetaminophen (NORCO/VICODIN) 5-325 MG tablet Take 1 tablet by mouth every 6 (six) hours as needed for moderate pain. 05/29/15   Carole Civil, MD  levETIRAcetam (KEPPRA) 750 MG tablet Take 1 tablet (750 mg total) by mouth 2 (two) times daily. 03/10/15   Kathrynn Ducking, MD  metoprolol (LOPRESSOR) 50 MG tablet TAKE ONE-HALF TABLET BY MOUTH THREE TIMES DAILY 04/16/15   Fayrene Helper, MD  mirtazapine (REMERON) 45 MG tablet TAKE ONE TABLET BY MOUTH ONCE DAILY AT BEDTIME 06/02/15   Kathrynn Ducking, MD  montelukast (SINGULAIR) 10 MG tablet TAKE ONE TABLET  BY MOUTH ONCE DAILY AT BEDTIME 04/16/15   Fayrene Helper, MD  polyethylene glycol [facility] / GLYCOLAX) packet Take 17 g by mouth daily. Patient taking differently: Take 17 g by mouth daily as needed for mild constipation.  05/05/15   Carole Civil, MD  silver sulfADIAZINE (SILVADENE) 1 % cream Apply 1 application topically daily. 05/28/15   Fayrene Helper, MD  sulfamethoxazole-trimethoprim (BACTRIM DS,SEPTRA DS) 800-160 MG tablet 7 day course 11/18 05/30/15   Historical Provider, MD  traMADol Veat[facility]) 100 MG 24 hr tablet Take 1 tablet (100 mg total) by mouth at bedtime. 04/04/15   Kathrynn Ducking, MD  Vitamin D, Ergocalciferol, (DRISDOL) 50000 UNITS CAPS capsule Take 1 capsule (50,000 Units total) by mouth every 7 (seven) days. 02/24/15   Fayrene Helper, MD   BP 133/98 mmHg  Pulse 42  Temp(Src) 98 F (36.7 C) (Oral)  Resp 16  Ht 5\' 6"  (1.676 m)  Wt 125 lb (56.7 kg)  BMI 20.19 kg/m2  SpO2 95% Physical Exam  Constitutional: She is oriented to person, place, and time. She appears well-developed and well-nourished. No distress.  HENT:  Head: Normocephalic and atraumatic.  Mouth/Throat: Oropharynx is clear and moist. No oropharyngeal exudate.  Eyes: Conjunctivae and EOM are normal. Pupils are equal, round, and reactive to light.  Neck: Normal range of motion. Neck supple.  No meningismus.  Cardiovascular: Normal rate, regular rhythm, normal heart sounds and intact distal pulses.   No murmur heard. Irregular rhythm  Pulmonary/Chest: Effort normal and breath sounds normal. No respiratory distress.  Abdominal: Soft. There is no tenderness. There is no rebound and no guarding.  Genitourinary:  Foley catheter in place with clear urine  Musculoskeletal: Normal range of motion. She exhibits no edema or tenderness.  No CVAT  kyphosis  Neurological: She is alert and oriented to person, place, and time. No cranial nerve deficit. She exhibits normal muscle tone. Coordination  normal.  No ataxia on finger to nose bilaterally. No pronator drift. 5/5 strength throughout. CN 2-12 intact.Equal grip strength. Sensation intact.   Skin: Skin is warm.  Psychiatric: She has a normal  mood and affect. Her behavior is normal.  Nursing note and vitals reviewed.   ED Course  Procedures (including critical care time) Labs Review Labs Reviewed  CBC WITH DIFFERENTIAL/PLATELET - Abnormal; Notable for the following:    RBC 3.78 (*)    MCV 101.3 (*)    All other components within normal limits  COMPREHENSIVE METABOLIC PANEL - Abnormal; Notable for the following:    CO2 33 (*)    AST 119 (*)    ALT 69 (*)    Total Bilirubin 0.2 (*)    All other components within normal limits  URINALYSIS, ROUTINE W REFLEX MICROSCOPIC (NOT AT [facility]) - Abnormal; Notable for the following:    Specific Gravity, Urine <1.005 (*)    Hgb urine dipstick LARGE (*)    Leukocytes, UA TRACE (*)    All other components within normal limits  PROTIME-INR - Abnormal; Notable for the following:    Prothrombin Time 17.3 (*)    All other components within normal limits  URINE MICROSCOPIC-ADD ON - Abnormal; Notable for the following:    Squamous Epithelial / LPF 0-5 (*)    Bacteria, UA RARE (*)    Crystals CA OXALATE CRYSTALS (*)    All other components within normal limits  ACETAMINOPHEN LEVEL - Abnormal; Notable for the following:    Acetaminophen (Tylenol), Serum <10 (*)    All other components within normal limits  URINE CULTURE    Imaging Review Ct Renal Stone Study  06/04/2015  CLINICAL DATA:  Gross hematuria. EXAM: CT ABDOMEN AND PELVIS WITHOUT CONTRAST TECHNIQUE: Multidetector CT imaging of the abdomen and pelvis was performed following the standard protocol without IV contrast. COMPARISON:  CT scan of June 17, 2013. FINDINGS: Mild right pleural effusion is noted with adjacent subsegmental atelectasis or possibly pneumonia. Left lung base is clear. Status post surgical pinning of left proximal  femur. Status post cholecystectomy. No focal abnormality is noted in the liver, spleen or pancreas on these unenhanced images. Adrenal glands appear normal. Bilateral nonobstructive nephrolithiasis is noted. Stable large left renal cyst is noted. No ureteral calculi are noted. Atherosclerosis of abdominal aorta is noted without aneurysm formation. Urinary bladder is decompressed secondary to Foley catheter. There is no evidence of bowel obstruction. No abnormal fluid collection is noted. Status post hysterectomy. No significant adenopathy is noted. Sigmoid diverticulosis is noted without inflammation. IMPRESSION: Mild right pleural effusion is noted with adjacent subsegmental atelectasis or possibly pneumonia. Bilateral nonobstructive nephrolithiasis is noted. No hydronephrosis or renal obstruction is noted. Nurse gross is of abdominal aorta is noted without aneurysm formation. Sigmoid diverticulosis is noted without inflammation. Electronically Signed   By: Marijo Conception, M.D.   On: 06/04/2015 18:40   I have personally reviewed and evaluated these images and lab results as part of my medical decision-making.   EKG Interpretation None      MDM   Final diagnoses:  Hematuria  Urinary tract infection with hematuria, site unspecified   Hematuria, on eliquis for Afib, recent UTI, on keflex.  No fever or vomiting. Urine culture from November 14 shows Escherichia coli sensitive to Keflex, resistant to Cipro  Hemoglobin is stable. Urinalysis with large blood without convincing evidence of infection. New culture will be sent.  CT shows bilateral nonobstructing nephrolithiasis. No renal stones. Has known chronic pleural effusion. Mild elevation of transminases.  No RUQ pain.  Previous cholecystectomy. Denies alcohol use.  Patient advised to have liver function rechecked by her PCP next week. Acetaminophen levels negative.  Suspect mild transaminitis possibly from antibiotic use. HR is not 42 but is  afib in the 70s. She is tolerant by mouth and at her baseline per her family. Advised to follow-up with her PCP and urology next week. Return precautions discussed.    Ezequiel Essex, MD 06/04/15 2113

## 2015-06-04 NOTE — Discharge Instructions (Signed)
Hematuria, Adult Follow up with urologist. Erin [name] the antibiotics as prescribed. Return to the ED if you develop worsening pain, fever, vomiting or any other concerns. Hematuria is blood in your urine. It can be caused by a bladder infection, kidney infection, prostate infection, kidney stone, or cancer of your urinary tract. Infections can usually be treated with medicine, and a kidney stone usually will pass through your urine. If neither of these is the cause of your hematuria, further workup to find out the reason may be needed. It is very important that you tell your health care provider about any blood you see in your urine, even if the blood stops without treatment or happens without causing pain. Blood in your urine that happens and then stops and then happens again can be a symptom of a very serious condition. Also, pain is not a symptom in the initial stages of many urinary cancers. HOME CARE INSTRUCTIONS   Drink lots of fluid, 3-4 quarts a day. If you have been diagnosed with an infection, cranberry juice is especially recommended, in addition to large amounts of water.  Avoid caffeine, tea, and carbonated beverages because they tend to irritate the bladder.  Avoid alcohol because it may irritate the prostate.  Take all medicines as directed by your health care provider.  If you were prescribed an antibiotic medicine, finish it all even if you start to feel better.  If you have been diagnosed with a kidney stone, follow your health care provider's instructions regarding straining your urine to catch the stone.  Empty your bladder often. Avoid holding urine for long periods of time.  After a bowel movement, women should cleanse front to back. Use each tissue only once.  Empty your bladder before and after sexual intercourse if you are a female. SEEK MEDICAL CARE IF:  You develop back pain.  You have a fever.  You have a feeling of sickness in your stomach (nausea) or  vomiting.  Your symptoms are not better in 3 days. Return sooner if you are getting worse. SEEK IMMEDIATE MEDICAL CARE IF:   You develop severe vomiting and are unable to keep the medicine down.  You develop severe back or abdominal pain despite taking your medicines.  You begin passing a large amount of blood or clots in your urine.  You feel extremely weak or faint, or you pass out. MAKE SURE YOU:   Understand these instructions.  Will watch your condition.  Will get help right away if you are not doing well or get worse.   This information is not intended to replace advice given to you by your health care provider. Make sure you discuss any questions you have with your health care provider.   Document Released: 06/28/2005 Document Revised: 07/19/2014 Document Reviewed: 02/26/2013 Elsevier Interactive Patient Education [employer].

## 2015-06-06 LAB — URINE CULTURE: Culture: NO GROWTH

## 2015-06-09 ENCOUNTER — Telehealth: Payer: Self-pay | Admitting: Family Medicine

## 2015-06-09 NOTE — Telephone Encounter (Signed)
Patient is going to be seen by [facility] nurse on Tuesday 11/29 and she has an appt with urology on Wednesday 11/30.  Sent message to [name] to alert of catheter problem.    Advised grandson to monitor catheter for blood clots.

## 2015-06-09 NOTE — Telephone Encounter (Signed)
Patient is stating that she has been in the ER 3 times for hematuria she has had a catheter in for 2 wks today, they told her that she needed to follow up with Dr. Jeffie Pollock at [facility] and they cant see her right away because they are booked, please advise?

## 2015-06-10 ENCOUNTER — Other Ambulatory Visit: Payer: Self-pay | Admitting: *Deleted
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
## 2015-06-11 ENCOUNTER — Ambulatory Visit: Payer: Medicare Other | Admitting: Urology

## 2015-06-13 ENCOUNTER — Ambulatory Visit (INDEPENDENT_AMBULATORY_CARE_PROVIDER_SITE_OTHER): Payer: Medicare Other | Admitting: Urology

## 2015-06-13 DIAGNOSIS — N3020 Other chronic cystitis without hematuria: Secondary | ICD-10-CM | POA: Diagnosis not present

## 2015-06-13 DIAGNOSIS — Z9289 Personal history of other medical treatment: Secondary | ICD-10-CM | POA: Diagnosis not present

## 2015-06-18 ENCOUNTER — Telehealth: Payer: Self-pay

## 2015-06-18 ENCOUNTER — Other Ambulatory Visit: Payer: Self-pay | Admitting: *Deleted

## 2015-06-18 NOTE — Telephone Encounter (Signed)
Message left with alisa to check with Dr Jannifer Franklin office

## 2015-06-18 NOTE — Telephone Encounter (Signed)
Alisa from [facility] called and said that Mrs Mcgaughey only has 2 gabapentin left and it is too early to get it refilled and she cannot go without it. It is because she has been having to take more than prescribed only in the middle of the night when she wakes up in pain and cannot go back to sleep. She takes 1 in the am and 3 at bedtime but wakes up around 3 am in pain and ends up taking another one a few nights a week and she has ran out early. Alisa wanted to let you know and see if you wanted to re evaluate her gabapentin dosage since her pain level seems to be increased. Please advise

## 2015-06-18 NOTE — Patient Outreach (Signed)
[facility]) Care Management  06/18/2015  Finna Mathiasen [facility] 22-Oct-1929 [id number]  Call received from Mrs. Tisdale's son/HCPOA [name] who reported that Mrs. Bowen only has 2 Gabapentin left. He called for a refill and was told it was too early. Mr. Mancino reports that Mrs. Devaux has had worsening aching/burning/stinging in her feet and legs over the last week. He says she is taking Gabapentin 100mg  qam and 300mg  qhs but also takes "an extra" when she is awakened with "excruciating pain" in her feet and legs in the middle of the night.   I notified [name] and have forwarded a message to Dr. Floyde Parkins who prescribed Gabapentin for management of Mrs. Kreitzer's peripheral neuropathy.   I will follow up with Mrs. Summerlin tomorrow.    [place] [facility]  [phone]

## 2015-06-18 NOTE — Telephone Encounter (Signed)
I recommned that she check with prescriber Dr Jannifer Franklin , pls explain

## 2015-06-19 ENCOUNTER — Telehealth: Payer: Self-pay | Admitting: Neurology

## 2015-06-19 ENCOUNTER — Other Ambulatory Visit: Payer: Self-pay | Admitting: Licensed Clinical Social Worker

## 2015-06-19 MED ORDER — GABAPENTIN 100 MG PO CAPS: ORAL_CAPSULE | ORAL | Status: DC

## 2015-06-19 NOTE — Patient Outreach (Signed)
Assessment: CSW called client on 06/19/15 and spoke via phone with client on 06/19/15. CSW verified identity of client.  CSW and Audi spoke of client needs. Client said she was hurting in her left foot.  She said she was taking pain medication as prescribed. Client said home health aide assists her in the home as scheduled each week. Client said she and her son, [name], had discussed skilled nursing level of care facilities. CSW spoke with client about client care plan. Client has communicated as needed with nursing facility in recent weeks. She said her son [name] had spoken on 06/18/15 with representatives of several skilled nursing facilities in the area. [name] is hoping client can remain at his home with supports in place as long as possible. However, [name] is researching local skilled nursing facilities for client.  Client said she can walk with assistance. She said she has her prescribed medications and is taking medications as prescribed. She said she is eating well and sleeping well.  Family members of client help transport client to and from client scheduled medical appointments. CSW thanked client for phone conversation with CSW on 06/19/15.  Plan: Client to continue to speak as needed with skilled nursing facility representatives regarding possible placement needs of client. CSW to inform RN [name] of above client information. CSW to call client in 4 weeks to assess client needs.  [name].Valita Righter MSW, LCSW Licensed Clinical Social Worker [facility] Care Management [phone]

## 2015-06-19 NOTE — Telephone Encounter (Signed)
I called the patient, left a message. She has run out of the gabapentin, she has been taking an extra capsule of the medication when needed. I will write the prescription to allow for an increased amount of the medication.

## 2015-06-25 ENCOUNTER — Other Ambulatory Visit: Payer: Self-pay | Admitting: *Deleted

## 2015-06-27 ENCOUNTER — Ambulatory Visit (INDEPENDENT_AMBULATORY_CARE_PROVIDER_SITE_OTHER): Payer: Medicare Other | Admitting: Urology

## 2015-06-27 DIAGNOSIS — N3020 Other chronic cystitis without hematuria: Secondary | ICD-10-CM

## 2015-06-27 DIAGNOSIS — Z9289 Personal history of other medical treatment: Secondary | ICD-10-CM | POA: Diagnosis not present
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
## 2015-06-30 ENCOUNTER — Encounter: Payer: Self-pay | Admitting: Family Medicine

## 2015-06-30 ENCOUNTER — Ambulatory Visit (INDEPENDENT_AMBULATORY_CARE_PROVIDER_SITE_OTHER): Payer: Medicare Other | Admitting: Family Medicine

## 2015-06-30 VITALS — BP 124/60 | HR 58 | Resp 16 | Ht 66.0 in | Wt 143.0 lb

## 2015-06-30 DIAGNOSIS — M48061 Spinal stenosis, lumbar region without neurogenic claudication: Secondary | ICD-10-CM

## 2015-06-30 DIAGNOSIS — M4806 Spinal stenosis, lumbar region: Secondary | ICD-10-CM | POA: Diagnosis not present

## 2015-06-30 DIAGNOSIS — I639 Cerebral infarction, unspecified: Secondary | ICD-10-CM | POA: Diagnosis not present

## 2015-06-30 DIAGNOSIS — M5430 Sciatica, unspecified side: Secondary | ICD-10-CM

## 2015-06-30 DIAGNOSIS — I6930 Unspecified sequelae of cerebral infarction: Secondary | ICD-10-CM | POA: Diagnosis not present

## 2015-06-30 DIAGNOSIS — I10 Essential (primary) hypertension: Secondary | ICD-10-CM

## 2015-06-30 NOTE — Patient Instructions (Addendum)
F/u in 3 month, call if you need me sooner  I see where Dr Jannifer Franklin is working with your pain with gabapentin which is the BEST and safest medication for you for chronic pain.   So as not to get several Docs treating the same problem , I will send him a message to make him aware that I am asking for his expertise in your chronic pain management now that you are no longer needing medication from Dr Aline Brochure Pain management will be by Dr Jannifer Franklin , your neurologist

## 2015-06-30 NOTE — Progress Notes (Signed)
   Subjective:    Patient ID: Erin [name], female    DOB: 28-Oct-1929, 81 y.o.   MRN: [id number]  HPI 1 week h/o increased back pain, no new trauma, main presenting concern is lack of access to hydrocodone which she has been getting from orthopedics for some time. Unfortunately there have been clear instances in the past , when I had been prescribing hydrocodone  To pt , that diversion had been taking place. She also is under chronic care by neurology as she is debilitated post stroke with tremor and he treats her for pain with gabapentin, which is her best option   Review of Systems See HPI Denies recent fever or chills. Denies sinus pressure, nasal congestion, ear pain or sore throat. Denies chest congestion, productive cough or wheezing. Denies chest pains, palpitations and leg swelling Denies abdominal pain, nausea, vomiting,diarrhea or constipation.   Denies dysuria, frequency, hesitancy or incontinence. Denies skin break down or rash.        Objective:   Physical Exam  BP 124/60 mmHg  Pulse 58  Resp 16  Ht 5\' 6"  (1.676 m)  Wt 143 lb (64.864 kg)  BMI 23.09 kg/m2  SpO2 96% Patient alert and oriented and in no cardiopulmonary distress.  HEENT: No facial asymmetry, EOMI,   oropharynx pink and moist.  Neck decreased ROM no JVD, no mass.  Chest: Clear to auscultation bilaterally.  CVS: S1, S2 no murmurs, no S3.Regular rate.  ABD: Soft non tender.   Ext: No edema  MS: Decreased  ROM lumbar spine, marked kyphosis  Skin: Intact, no ulcerations or rash noted.  CNS: CN 2-12 intact,  Reduced  power, and tone ithroughout      Assessment & Plan:  SPINAL STENOSIS, LUMBAR Pt in with her grandson main concern is access to hydrocodone which she had been getting from her ortho Doc in the past, this is mo longer an option, she is concerned. On several occasions , pt has appeared to be  sedsated and potentially overmedicated as well as there have been problems with her  pain medication as far as potential diversion. She is under the care of a neurologist who is treating her chronically with gabapentin for pain, this is her best option, and I will leave her pain management to the one Provider I have both explained this and documented this on her discharge note  [place], post-stroke Noted to be increasingly weak and less mobile, unfortunately problems with one of her sons is a major drain on her both mentally, emotionally and physically  Essential hypertension, benign Controlled, no change in medication

## 2015-07-02 ENCOUNTER — Telehealth: Payer: Self-pay | Admitting: Family Medicine

## 2015-07-02 NOTE — Telephone Encounter (Signed)
Ms. Bezerra grandson Erin [name] is calling asking for urine specimen results from Dr. Jeffie Pollock, Please advise?

## 2015-07-02 NOTE — Telephone Encounter (Signed)
Called Aram Beecham with [facility] office and she will have the nurse call him back with the results

## 2015-07-03 ENCOUNTER — Other Ambulatory Visit: Payer: Self-pay | Admitting: Family Medicine

## 2015-07-04 ENCOUNTER — Other Ambulatory Visit: Payer: Self-pay | Admitting: Urology

## 2015-07-04 ENCOUNTER — Ambulatory Visit (INDEPENDENT_AMBULATORY_CARE_PROVIDER_SITE_OTHER): Payer: Medicare Other | Admitting: Urology

## 2015-07-04 DIAGNOSIS — N3941 Urge incontinence: Secondary | ICD-10-CM

## 2015-07-04 DIAGNOSIS — N393 Stress incontinence (female) (male): Secondary | ICD-10-CM

## 2015-07-04 DIAGNOSIS — N200 Calculus of kidney: Secondary | ICD-10-CM

## 2015-07-04 DIAGNOSIS — R1011 Right upper quadrant pain: Secondary | ICD-10-CM

## 2015-07-04 DIAGNOSIS — N3020 Other chronic cystitis without hematuria: Secondary | ICD-10-CM

## 2015-07-10 ENCOUNTER — Encounter: Payer: Self-pay | Admitting: Gastroenterology

## 2015-07-12 ENCOUNTER — Other Ambulatory Visit: Payer: Self-pay | Admitting: Family Medicine

## 2015-07-17 ENCOUNTER — Telehealth: Payer: Self-pay | Admitting: Neurology

## 2015-07-17 ENCOUNTER — Other Ambulatory Visit: Payer: Self-pay | Admitting: *Deleted

## 2015-07-17 ENCOUNTER — Ambulatory Visit (HOSPITAL_COMMUNITY)
Admission: RE | Admit: 2015-07-17 | Discharge: 2015-07-17 | Disposition: A | Discharge status: Home / Self Care | Payer: Medicare Other | Source: Ambulatory Visit | Attending: Urology | Admitting: Urology

## 2015-07-17 DIAGNOSIS — R1031 Right lower quadrant pain: Secondary | ICD-10-CM | POA: Insufficient documentation

## 2015-07-17 DIAGNOSIS — I482 Chronic atrial fibrillation, unspecified: Secondary | ICD-10-CM

## 2015-07-17 DIAGNOSIS — N200 Calculus of kidney: Secondary | ICD-10-CM

## 2015-07-17 NOTE — Telephone Encounter (Signed)
[facility] nurse indicates hypersomnolence with this patient. I'll try to get the patient in the office in the next few days to review medications, make adjustments.

## 2015-07-17 NOTE — Patient Outreach (Addendum)
[facility]) Care Management  07/17/2015  [place] [facility] 1929-08-21 [id number]   I received a call from [name], son/primary caregiver/HCPOA for Mrs.Woodrick. Mr. Chrzanowski states his mother is not having any further difficulties with skin and her foot wounds are healed.   Mrs. Laspina is having several issues related to medication management:   Sedation - Mr. Podolak is pleased that Mrs. Thibeau's neuropathic foot pain is well controlled but is concerned that she seems to be sleeping a good deal more than is usual for her. He reports that she is sleeping at least 12 hours each night and "most of the time" during the day. He also noted that Mrs.Hawood is "so groggy" at times that "her legs drag" when he and his son try to help her to the bathroom or bedside commode. Mr Miracle reports that Mrs. Phillips is taking the following medications:   Gabapentin: 100mg  qam, 200mg  2pm each day, 300mg  bedtime each day Xanax 7.5mg  every evening ta bedtime Buspirone 5mg  BID Norco 5-325mg  q6h prn (4 tablets on hand)  Tylenol ES 2 tabs every 6 hours (taking almost daily)  Pain - [name] also asked about pain management as Mrs. Rademaker has 4 Norco on hand and does not have refills or a prescription. I contacted Dr. Griffin Dakin office and spoke with Loma Sousa. We discussed that as per Dr. Griffin Dakin last note, she was to inquire with Dr. Jannifer Franklin about Mrs. Heron's pain management given that he is managing her neuropathy. [name] indicated her concern about having more than one provider dealing with pain management.   Expenses [facility]: [facility] [place]) - [name] stated that Mrs. Moretto is in the donut hole and affording her Eliquis this month will be a significant strain. The out of pocket cost to her will be > $350. Her last prescription for Eliquis was filled on or about 12/30. I have referred Mrs. Senn to our pharmacy team for medication assistance assessment.   Plan:   I will forward the information  outlined above to [name] and Dr. Jannifer Franklin.   I will see Mrs.Montas at home next week on Thursday at 10am.   >>> Best Contact number for Mrs. Egnor's son [name] is [phone] <<<<<.    [facility]  [phone]

## 2015-07-18 ENCOUNTER — Other Ambulatory Visit: Payer: Self-pay | Admitting: *Deleted

## 2015-07-18 ENCOUNTER — Other Ambulatory Visit: Payer: Self-pay | Admitting: Licensed Clinical Social Worker

## 2015-07-18 NOTE — Patient Outreach (Signed)
[facility]) Care Management  07/18/2015  [place] [facility] May 19, 1930 [id number]   I received a call from [place] Lee who spoke with Mrs. Furber today. Mr. Shea Evans told me Mrs.Swindell expressed to him that she was having urinary difficulty.   I spoke with Mrs. Farrugia' son/HCPOA [name] and her grandson [name]/hands on caregiver while Mrs. Reiswig was resting. They both indicated that Mrs.Seith has been having more difficulty with urination of late but had a renal ultrasound yesterday as ordered by the urologist. She is being followed closely by urology.   Of note, Dr. Jannifer Franklin responded to my correspondence the previous day regarding hypersomnolence and pain management concerns expressed by son/HCPOA [name]. Mrs. Lento has an appointment with Dr. Jannifer Franklin on Tuesday at 3pm. I confirmed this information with son and grandson.   Plan: I will see Mrs. Pair at home on Thursday for routine home visit and follow up.    Agency Management  [phone]

## 2015-07-18 NOTE — Patient Outreach (Signed)
Assessment:  CSW called home phone number of client on 07/18/15. CSW spoke via phone with client on 07/18/15.  CSW verified client identity. Erin [name] and CSW spoke of client needs. Client has home health aide who assists her in the home weekly as scheduled. She has been considering skilled nursing placement for several months.  CSW and client spoke of client care plan. She said she continues to talk with son, Erin [name] about skilled nursing placement options for client.  She and he are talking about her care needs and possible skilled nursing placement for client. She has her prescribed medications and is taking medications as prescribed. She is attending scheduled medical appointments. She has family support. She is eating well and sleeping adequately. She receives [place] support from [employer]. She said she is having difficulty in urinating today. She said she did not have pain in urinating but was having difficulty today in urinating. CSW plans to call RN Erin [name] on 07/18/15 to inform Erin [name] of the above information. Client agreed to this plan. Client was appreciative of call from [place] on 07/18/15.   Plan: Client to communicate as needed with son Erin [name] to discuss skilled nursing care options and needs of client. Client to attend scheduled medical appointments CSW to call client in three weeks.  Erin [name].Erin [name] MSW, LCSW Licensed Clinical Social Worker [facility] Care Management [phone]

## 2015-07-18 NOTE — Telephone Encounter (Signed)
I called the patient. Appointment scheduled 07/22/15.
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
## 2015-07-22 ENCOUNTER — Ambulatory Visit: Payer: Medicare Other | Admitting: Neurology

## 2015-07-24 ENCOUNTER — Other Ambulatory Visit: Payer: Self-pay | Admitting: *Deleted
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
## 2015-07-25 ENCOUNTER — Telehealth: Payer: Self-pay | Admitting: Neurology

## 2015-07-25 MED ORDER — HYDROCODONE-ACETAMINOPHEN 5-325 MG PO TABS: 1.0000 | ORAL_TABLET | Freq: Four times a day (QID) | ORAL | Status: DC | PRN

## 2015-07-25 NOTE — Telephone Encounter (Signed)
I called back and spoke with Erin [name].  She verified med was previously prescribed by Dr Adron Bene, but he and Dr Moshe Cipro spoke and felt it would be best if Dr Jannifer Franklin would take over this Rx.  Patient has rescheduled missed appt, and will be seen next week.  Please advise. Thank you.

## 2015-07-25 NOTE — Telephone Encounter (Signed)
[facility] Case Mgr called to request refill of HYDROcodone-acetaminophen (NORCO/VICODIN) 5-325 MG tablet. States patient had appointment that had to be cancelled due to snow, appointment rescheduled to 07/31/15, will run out of medication before appointment.

## 2015-07-25 NOTE — Telephone Encounter (Signed)
I will refill the hydrocodone.

## 2015-07-26 NOTE — Assessment & Plan Note (Signed)
Pt in with her grandson main concern is access to hydrocodone which she had been getting from her ortho Doc in the past, this is mo longer an option, she is concerned. On several occasions , pt has appeared to be  sedsated and potentially overmedicated as well as there have been problems with her pain medication as far as potential diversion. She is under the care of a neurologist who is treating her chronically with gabapentin for pain, this is her best option, and I will leave her pain management to the one Provider I have both explained this and documented this on her discharge note

## 2015-07-26 NOTE — Assessment & Plan Note (Signed)
Noted to be increasingly weak and less mobile, unfortunately problems with one of her sons is a major drain on her both mentally, emotionally and physically

## 2015-07-26 NOTE — Assessment & Plan Note (Signed)
Controlled, no change in medication  

## 2015-07-28 ENCOUNTER — Telehealth: Payer: Self-pay

## 2015-07-28 ENCOUNTER — Other Ambulatory Visit: Payer: Self-pay | Admitting: *Deleted

## 2015-07-28 NOTE — Telephone Encounter (Signed)
Rx ready for pick up. 

## 2015-07-28 NOTE — Patient Outreach (Signed)
[facility]) Care Management  07/28/2015  Erin [name] [facility] 12/12/1929 [id number]   Call received from Erin [name]'s son Erin [name] who reports requested prescription for pain medication is not available. I noted in EPIC that the prescription was made available today at 11am and notified Erin [name] who will pick up the prescription and deliver it to [facility].    [facility]  [phone]

## 2015-07-30 ENCOUNTER — Other Ambulatory Visit: Payer: Self-pay | Admitting: *Deleted

## 2015-07-30 NOTE — Patient Outreach (Signed)
[facility]) Care Management  07/30/2015  Chantrelle Valcarcel [facility] 07-Jul-1930 [id number]  Mrs. Suen's son [name] reached out to me by phone this afternoon to ask if we could help Mrs. Jaroszewski pursue long term skilled nursing facility placement. [name] states Mrs. Yebra made this request today after several long family conversations.   I contacted Dr. Griffin Dakin office, speaking with Loma Sousa to update her and request an FL-2 be initiated.  Plan: I will follow up with LCSW [name] for social work support. I will follow up with Mrs.Branam by phone tomorrow.    [facility]  [phone]

## 2015-07-31 ENCOUNTER — Ambulatory Visit (INDEPENDENT_AMBULATORY_CARE_PROVIDER_SITE_OTHER): Payer: Medicare Other | Admitting: Neurology

## 2015-07-31 ENCOUNTER — Encounter: Payer: Self-pay | Admitting: Neurology

## 2015-07-31 VITALS — BP 128/65 | HR 59

## 2015-07-31 DIAGNOSIS — G250 Essential tremor: Secondary | ICD-10-CM | POA: Diagnosis not present

## 2015-07-31 DIAGNOSIS — G63 Polyneuropathy in diseases classified elsewhere: Secondary | ICD-10-CM

## 2015-07-31 MED ORDER — LIDOCAINE 5 % EX PTCH: 1.0000 | MEDICATED_PATCH | CUTANEOUS | Status: DC

## 2015-07-31 MED ORDER — MIRTAZAPINE 30 MG PO TABS: 30.0000 mg | ORAL_TABLET | Freq: Every day | ORAL | Status: DC

## 2015-07-31 NOTE — Progress Notes (Signed)
Reason for visit:  Hypersomnolence  Erin [name] is an 82 y.o. female  History of present illness:   Erin [name] is an 82 year old right-handed white female with a history of a right brain stroke and left hemiparesis. The patient had a lot of discomfort in the back and in the feet, particularly involving the fifth toe on the left foot. The patient has not been sleeping well at night in part secondary to the pain. The patient also has a history of sleep apnea, but the patient is not on CPAP. The patient has recently noted to be quite sleepy during the day, she will get up to eat, then go back and take a nap. The patient is having vivid dreams at night, she is on Aricept taking 10 mg in the evening hours. She reports back pain on the right side, she denies any pain shooting down the leg. She has swelling in ankles, she is on gabapentin taking 100 mg in the morning 200 mg midday and 300 mg in the evening. They have cut back a bit on the gabapentin without benefit from the drowsiness. The patient has an essential tremor that is more prominent now on the right side than the left side following the stroke. She  Is followed at home through [facility]. The nurse was concerned about the excessive drowsiness. The patient takes hydrocodone and Ultram for pain. She is on mirtazapine 45 mg in the evening for depression and for sleeping. She also takes alprazolam 0.5 mg taking 1.5 tablets in the evening.  Past Medical History  Diagnosis Date  . Anxiety   . Arthritis   . Hyperlipidemia   . Essential hypertension, benign   . Osteoporosis   . GERD (gastroesophageal reflux disease)   . Hearing loss   . Restless leg   . Tremor   . Leg edema   . Obese   . Gall bladder disease   . Cataract   . Carpal tunnel syndrome of left wrist   . Atrial fibrillation ([place])   . Sleep apnea   . Esophageal dilatation   . Frequent falls   . History of nuclear stress test 08/31/2012    Lexiscan cardiolite negative for  ischemia  . Polyneuropathy in other diseases classified elsewhere ([place]) 10/03/2013  . Cardioembolic stroke ([place]) [id number]  . UTI (lower urinary tract infection)   . History of stroke with current residual effects 11/20/2014  . Seizures ([place])   . Focal motor seizure disorder ([place]) 12/24/2014    Left leg involvment  . Sequela, post-stroke 04/04/2015     Hemiparesis, nondominant hemisphere    Past Surgical History  Procedure Laterality Date  . Appendectomy    . Cholecystectomy    . Abdominal hysterectomy    . Fracture surgery      Left arm x4  . Benign cyst removed from kidney and lung, bilateral mastectomy    . Bilateral great toenail removal    . Knee arthroscopy  2012    Right knee, Dr Aline Brochure  . Left forearm    . Breast surgery      Bilateral 2000  . Tonsillectomy    . R hand surgery    . Bronchoscopy  02/15/2000  . Right vats.  "  . Right upper lobe wedge resection.  "  . Upper gastrointestinal endoscopy  11/25/1999    Esophagitis/ Normal proximal esophagus, stomach and duodenum  . Colonoscopy  01/17/2009    Dr. Deatra Ina : Internal hemorrhoids/Diverticula, scattered  in the ascending colon/  Moderate diverticulosis ascending colon to sigmoid colon  . Esophagogastroduodenoscopy   04/23/2003    Dr. Deatra Ina: HIATAL HERNIA  . Colonoscopy  01/16/2001    Normal  . Cataract extraction, bilateral    . Esophagogastroduodenoscopy  03/20/2012    [id number] ring-LIKELY CAUSING MILD DYSPHAGIA/SMALL hiatal hernia/Multiple sessile polyps ranging between 3-65mm , path benign  . Cataract extraction    . Carpal tunnel release      Left wrist  . Transthoracic echocardiogram  06/24/2009    EF=>55%, mild assymetric LVH; LA mildly dilated; mild mitral annular calcif, borderline MVP, mild-mod MR; mild-mod TR, RV systolic pressure elevated, mild pulm HTN; AV mildly sclerotic; mild pulm valve regurg - ordered r/t bradycardia   . Endovenous ablation saphenous vein w/ laser  01/2011    Right GSV    . Cholecystectomy    . Intramedullary (im) nail intertrochanteric Left 03/25/2014    Procedure: INTRAMEDULLARY NAIL INTERTROCHANTRIC LEFT HIP;  Surgeon: Marianna Payment, MD;  Location: [facility];  Service: Orthopedics;  Laterality: Left;    Family History  Problem Relation Age of Onset  . Diabetes Mother   . Heart disease Mother   . Stroke Mother   . Cancer Sister     KIDNEY  . Emphysema Sister   . Heart disease Brother   . Heart disease Sister   . Emphysema Sister   . Cancer Sister     BREAST  . Heart disease Brother   . Colon cancer Sister   . Alzheimer's disease Father   . Heart disease Sister   . Tremor Sister   . Tremor Brother   . Alcoholism Child   . Cancer Brother   . Pancreatic cancer Brother   . Diabetes Son     Social history:  reports that she has never smoked. She has never used smokeless tobacco. She reports that she does not drink alcohol or use illicit drugs.    Allergies  Allergen Reactions  . Codeine Hives  . Morphine And Related Itching and Other (See Comments)    Makes pt hallucinate  . Actonel [Risedronate Sodium] Other (See Comments)    Abdominal pain  . Fosamax [Alendronate Sodium] Other (See Comments)    Abdominal pian  . Losartan Other (See Comments)    Hospitalized in 06/2013 with pancreatitis, losartan is associated with increased risk of pancreatitis ,  Hence discontinued  . Nitrofurantoin Other (See Comments)    Hospitalized with pancreatitis in 06/2013. Nitrofurantoin implicated as a possible cause  . Penicillins     Medications:  Prior to Admission medications   Medication Sig Start Date End Date Taking? Authorizing Provider  acetaminophen (TYLENOL) 500 MG tablet Take 1,000 mg by mouth every 6 (six) hours as needed for mild pain or moderate pain.   Yes Historical Provider, MD  ALPRAZolam Duanne Moron) 0.5 MG tablet Take 1.5 tablets (0.75 mg total) by mouth at bedtime as needed for anxiety. 04/04/15  Yes Kathrynn Ducking, MD  amLODipine  (NORVASC) 10 MG tablet TAKE ONE TABLET BY MOUTH ONCE DAILY 07/04/15  Yes Fayrene Helper, MD  atorvastatin (LIPITOR) 20 MG tablet Take 1 tablet by mouth daily. 12/20/14  Yes Historical Provider, MD  atorvastatin (LIPITOR) 20 MG tablet TAKE ONE TABLET BY MOUTH ONCE DAILY 07/04/15  Yes Fayrene Helper, MD  busPIRone (BUSPAR) 5 MG tablet TAKE ONE TABLET BY MOUTH TWICE DAILY 11/25/14  Yes Fayrene Helper, MD  cetirizine (ZYRTEC) 10 MG tablet Take 10  mg by mouth daily.   Yes Historical Provider, MD  donepezil (ARICEPT) 10 MG tablet Take 1 tablet (10 mg total) by mouth at bedtime. 02/17/15  Yes Fayrene Helper, MD  doxylamine, Sleep, (UNISOM) 25 MG tablet Take 25 mg by mouth at bedtime as needed.   Yes Historical Provider, MD  ELIQUIS 5 MG TABS tablet TAKE ONE TABLET BY MOUTH TWICE DAILY 03/05/15  Yes Kathrynn Ducking, MD  esomeprazole (NEXIUM) 20 MG packet Take 20 mg by mouth daily before breakfast. 03/31/15  Yes Fayrene Helper, MD  gabapentin (NEURONTIN) 100 MG capsule One capsule in the morning, 2 capsules at midday, 3 capsules in the evening 06/19/15  Yes Kathrynn Ducking, MD  HYDROcodone-acetaminophen (NORCO/VICODIN) 5-325 MG tablet Take 1 tablet by mouth every 6 (six) hours as needed for moderate pain. 07/25/15  Yes Kathrynn Ducking, MD  levETIRAcetam (KEPPRA) 750 MG tablet Take 1 tablet (750 mg total) by mouth 2 (two) times daily. 03/10/15  Yes Kathrynn Ducking, MD  metoprolol (LOPRESSOR) 50 MG tablet TAKE ONE-HALF TABLET BY MOUTH THREE TIMES DAILY 07/15/15  Yes Fayrene Helper, MD  montelukast (SINGULAIR) 10 MG tablet TAKE ONE TABLET BY MOUTH AT BEDTIME 07/15/15  Yes Fayrene Helper, MD  polyethylene glycol [facility] / GLYCOLAX) packet Take 17 g by mouth daily. Patient taking differently: Take 17 g by mouth daily as needed for mild constipation.  05/05/15  Yes Carole Civil, MD  silver sulfADIAZINE (SILVADENE) 1 % cream Apply 1 application topically daily. 05/28/15  Yes Fayrene Helper, MD  Vitamin D, Ergocalciferol, (DRISDOL) 50000 UNITS CAPS capsule Take 1 capsule (50,000 Units total) by mouth every 7 (seven) days. 02/24/15  Yes Fayrene Helper, MD  zolpidem (AMBIEN) 5 MG tablet Take 1 tablet (5 mg total) by mouth at bedtime as needed for sleep. 04/01/15  Yes Kathrynn Ducking, MD    ROS:  Out of a complete 14 system review of symptoms, the patient complains only of the following symptoms, and all other reviewed systems are negative.   Runny nose , difficulty swallowing, drooling  Leg swelling  Daytime sleepiness, sleep talking  Frequency of urination  Back pain, walking difficulty, neck stiffness  Blood pressure 128/65, pulse 59.  Physical Exam  General: The patient is alert and cooperative at the time of the examination.  Skin:  2+ edema at ankles is noted bilaterally.   Neurologic Exam  Mental status: The patient is alert and oriented x 3 at the time of the examination. The patient has apparent normal recent and remote memory, with an apparently normal attention span and concentration ability.   Cranial nerves: Facial symmetry is present. Speech is normal, no aphasia or dysarthria is noted. Extraocular movements are full. Visual fields are notable for a partial left homonymous visual field deficit.  Motor: The patient has good strength in  Right extremities. On the left side, the patient has 4/5 strength.   Sensory examination: Soft touch sensation is symmetric on the face, arms, and legs.  Coordination: The patient has good finger-nose-finger and heel-to-shin on the right, the patient has difficulty performing these tasks on the left. The patient has tremors involving both upper extremities, more prominent on the right.  Gait and station: The patient is wheelchair-bound, she is stooped, leans to the left. The patient is unable to fully extend the neck. The patient was not ambulated.  Reflexes: Deep tendon reflexes are  symmetric.   Assessment/Plan:   1.  Right brain stroke, left hemiparesis   2. Gait disorder   3. Memory disorder   4. Hypersomnolence   5. Chronic pain, back and left foot   6. History of seizures   The patient is on a multitude of medications that may cause drowsiness. The patient is on Keppra, hydrocodone, Remeron, alprazolam, Ultram. The patient takes donepezil at night which may cause vivid dreams. She will take the donepezil in the morning. We will reduce the dose of the Remeron to 30 mg at night, that he will stop the Ultram. We will give her Lidoderm patch for the back pain in the left foot pain. She will follow-up in March 2017 for her next scheduled revisit. The patient is doing well with the seizures, with no recurrence.  Jill Alexanders MD 07/31/2015 8:33 PM  [facility] [address] [place], [place] [address]  Phone [phone] Fax [phone]

## 2015-07-31 NOTE — Patient Instructions (Addendum)
We will start taking the Aricept (donepezil) in the morning, and reduce the mirtazipine to 30 mg at night and stop the ultram.  Fall Prevention in the Home  Falls can cause injuries. They can happen to people of all ages. There are many things you can do to make your home safe and to help prevent falls.  WHAT CAN I DO ON THE OUTSIDE OF MY HOME?  Regularly fix the edges of walkways and driveways and fix any cracks.  Remove anything that might make you trip as you walk through a door, such as a raised step or threshold.  Trim any bushes or trees on the path to your home.  Use bright outdoor lighting.  Clear any walking paths of anything that might make someone trip, such as rocks or tools.  Regularly check to see if handrails are loose or broken. Make sure that both sides of any steps have handrails.  Any raised decks and porches should have guardrails on the edges.  Have any leaves, snow, or ice cleared regularly.  Use sand or salt on walking paths during winter.  Clean up any spills in your garage right away. This includes oil or grease spills. WHAT CAN I DO IN THE BATHROOM?   Use night lights.  Install grab bars by the toilet and in the tub and shower. Do not use towel bars as grab bars.  Use non-skid mats or decals in the tub or shower.  If you need to sit down in the shower, use a plastic, non-slip stool.  Keep the floor dry. Clean up any water that spills on the floor as soon as it happens.  Remove soap buildup in the tub or shower regularly.  Attach bath mats securely with double-sided non-slip rug tape.  Do not have throw rugs and other things on the floor that can make you trip. WHAT CAN I DO IN THE BEDROOM?  Use night lights.  Make sure that you have a light by your bed that is easy to reach.  Do not use any sheets or blankets that are too big for your bed. They should not hang down onto the floor.  Have a firm chair that has side arms. You can use this  for support while you get dressed.  Do not have throw rugs and other things on the floor that can make you trip. WHAT CAN I DO IN THE KITCHEN?  Clean up any spills right away.  Avoid walking on wet floors.  Keep items that you use a lot in easy-to-reach places.  If you need to reach something above you, use a strong step stool that has a grab bar.  Keep electrical cords out of the way.  Do not use floor polish or wax that makes floors slippery. If you must use wax, use non-skid floor wax.  Do not have throw rugs and other things on the floor that can make you trip. WHAT CAN I DO WITH MY STAIRS?  Do not leave any items on the stairs.  Make sure that there are handrails on both sides of the stairs and use them. Fix handrails that are broken or loose. Make sure that handrails are as long as the stairways.  Check any carpeting to make sure that it is firmly attached to the stairs. Fix any carpet that is loose or worn.  Avoid having throw rugs at the top or bottom of the stairs. If you do have throw rugs, attach them to the  floor with carpet tape.  Make sure that you have a light switch at the top of the stairs and the bottom of the stairs. If you do not have them, ask someone to add them for you. WHAT ELSE CAN I DO TO HELP PREVENT FALLS?  Wear shoes that:  Do not have high heels.  Have rubber bottoms.  Are comfortable and fit you well.  Are closed at the toe. Do not wear sandals.  If you use a stepladder:  Make sure that it is fully opened. Do not climb a closed stepladder.  Make sure that both sides of the stepladder are locked into place.  Ask someone to hold it for you, if possible.  Clearly mark and make sure that you can see:  Any grab bars or handrails.  First and last steps.  Where the edge of each step is.  Use tools that help you move around (mobility aids) if they are needed. These include:  Canes.  Walkers.  Scooters.  Crutches.  Turn on the  lights when you go into a dark area. Replace any light bulbs as soon as they burn out.  Set up your furniture so you have a clear path. Avoid moving your furniture around.  If any of your floors are uneven, fix them.  If there are any pets around you, be aware of where they are.  Review your medicines with your doctor. Some medicines can make you feel dizzy. This can increase your chance of falling. Ask your doctor what other things that you can do to help prevent falls.   This information is not intended to replace advice given to you by your health care provider. Make sure you discuss any questions you have with your health care provider.   Document Released: 04/24/2009 Document Revised: 11/12/2014 Document Reviewed: 08/02/2014 Elsevier Interactive Patient Education [employer].

## 2015-08-04 ENCOUNTER — Other Ambulatory Visit: Payer: Self-pay | Admitting: *Deleted
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
## 2015-08-05 ENCOUNTER — Telehealth: Payer: Self-pay

## 2015-08-05 ENCOUNTER — Telehealth: Payer: Self-pay | Admitting: Neurology

## 2015-08-05 MED ORDER — LIDOCAINE 5 % EX OINT: 1.0000 "application " | TOPICAL_OINTMENT | Freq: Every day | CUTANEOUS | Status: DC | PRN

## 2015-08-05 NOTE — Telephone Encounter (Signed)
I completed PA through cover my meds. I accidentally put diagnosis code for tremor in instead of polyneuropathy. I called [employer] and spoke to [place]. She added the correct code and PA is under review.

## 2015-08-05 NOTE — Telephone Encounter (Signed)
I called, left a message. The Lidoderm patches were denied through the insurance, I will call in lidocaine ointment instead.

## 2015-08-06 ENCOUNTER — Other Ambulatory Visit: Payer: Self-pay | Admitting: Licensed Clinical Social Worker

## 2015-08-06 NOTE — Patient Outreach (Signed)
Assessment:  CSW called home phone number of client on 08/06/15 and spoke via phone with client on 08/06/15. CSW verified client identity. CSW and Amalia spoke of client needs. Stevey has prescribed medications and is taking medications as prescribed. She has support from her family. Her son, Parrish Rahimi, is [place] of Attorney for client. Client has a home health aide who assists client weekly as scheduled in the home. CSW and client spoke of client care plan. CSW encouraged Dailene to continue to communicate with [name], her son, regarding nursing facility options for client in the area. Keturah Morris is planning to begin touring nursing care facilities in the area soon.  CSW invited [place] to call CSW at [phone] as needed to discuss social work needs of client .  CSW thanked [place] for phone conversation with CSW on 08/06/15.   Plan:  Aishah Homsher to communicate as needed in next 30 days with her son Sherika Schrom to discuss nursing facility options for client in the area. CSW to collaborate as needed with RN [name] in monitoring client needs. CSW to call client in three weeks to assess client needs.  [name].Akirah Storck MSW, LCSW Licensed Clinical Social Worker [facility] Care Management [phone]

## 2015-08-11 ENCOUNTER — Other Ambulatory Visit: Payer: Self-pay | Admitting: Family Medicine

## 2015-08-26 ENCOUNTER — Other Ambulatory Visit: Payer: Self-pay | Admitting: Licensed Clinical Social Worker

## 2015-08-26 NOTE — Patient Outreach (Signed)
Assessment:  CSW called client home phone number on 08/26/15. CSW spoke via phone with client on 08/26/15  CSW verified client identity. CSW and Ahlea spoke of client needs. Client has her prescribed medications and is taking medications as prescribed. She has in home support from a home health aide, as scheduled, weekly.  She has support from her family.  CSW and client spoke of client care plan. CSW encouraged client to talk with her son, Hye Townshend, regarding options for client for skilled nursing care in the area.  Client is sleeping well.  Client receives [place] support with RN [name]. CSW encouraged Janyra to call CSW at [phone] as needed to discuss social work needs of client. CSW thanked [place] for phone conversation with CSW on 08/26/15.   Plan: Client to speak with her son, Kenlie Jaskolka, in the next 30 days regarding skilled nursing care options for client in the area. CSW to call client in 4 weeks to assess client needs.  [name].Amillya Chavira MSW, LCSW Licensed Clinical Social Worker [facility] Care Management [phone]

## 2015-08-29 ENCOUNTER — Other Ambulatory Visit: Payer: Self-pay | Admitting: Family Medicine

## 2015-09-02 ENCOUNTER — Other Ambulatory Visit: Payer: Self-pay | Admitting: *Deleted

## 2015-09-02 ENCOUNTER — Other Ambulatory Visit: Payer: Self-pay | Admitting: Neurology

## 2015-09-02 ENCOUNTER — Other Ambulatory Visit: Payer: Self-pay | Admitting: Family Medicine

## 2015-09-02 ENCOUNTER — Encounter: Payer: Self-pay | Admitting: *Deleted
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
## 2015-09-08 ENCOUNTER — Other Ambulatory Visit: Payer: Self-pay | Admitting: *Deleted

## 2015-09-08 NOTE — Patient Outreach (Signed)
[facility]) Care Management  09/08/2015  [place] Mar 28, 1930 [id number]   Care coordination with Erin Nan LCSW [facility] Care Management) today regarding Erin [name]'s desire to transition to long term skilled nursing care. I met with Erin George Erin [name] at Dr. Joycelyn Schmid Simpson's office on Friday. Ms. Javier [name] offered to fax updated FL2 to [facility] and [facility] as per request of Erin [name] and her son.   I updated Erin [name] today who stated he would follow up with Erin [name] re: bed search and transition to higher level of care.    [place] Management  [phone]

## 2015-09-10 ENCOUNTER — Other Ambulatory Visit: Payer: Self-pay | Admitting: *Deleted

## 2015-09-10 NOTE — Patient Outreach (Signed)
[facility]) Care Management  09/10/2015  Bevyn Dew [facility] [birth year] [id number]   Call received from [name], Mrs. Rallis's son/HCPOA. Mr. Scaife completed [place] Medicaid application yesterday on behalf of Mrs. Porr. Mr. Muha confirmed that Mrs. Franchini and the family still desire long term SNF placement.   Updated [name] LCSW.   , [place] Care Management  [phone]

## 2015-09-17 ENCOUNTER — Other Ambulatory Visit: Payer: Self-pay | Admitting: Licensed Clinical Social Worker

## 2015-09-17 ENCOUNTER — Other Ambulatory Visit: Payer: Self-pay | Admitting: *Deleted

## 2015-09-17 NOTE — Patient Outreach (Signed)
Assessment: CSW called [facility] in [place], [place] on 09/17/15. CSW spoke via phone with Erin [name], case Freight forwarder at [name] on 09/17/15. CSW verified identity of Erin [name], case Freight forwarder at [facility] in [place], [place]. [place] and Erin [name] spoke of client needs. Erin [name] said that FL-2 document had been received for client at [name]. She and CSW spoke of client needs.  CSW gave Erin [name] the name and phone number of client's son, Erin [name], who is [place] for client. CSW explained client needs and that she and her son, Erin [name], are hoping she may be able to go to skilled nursing level of care facility soon. Erin [name] said she would communicate this information to Erin [name], Engineer, site at [facility] in [name], [place] regarding Erin [name].  CSW also informed Erin [name] that Erin [name] had applied for long term care Medicaid for client for skilled nursing level of care.  CSW thanked Erin [name] for phone call with CSW on 09/17/15.   Plan: Client and Erin [name] to continue discussing skilled nursing level of care needs for Erin [name] in next 30 days. CSW to collaborate with Erin [name] in monitoring client needs. CSW to call client in three weeks to assess client needs.  Erin [name].Erin [name] MSW, LCSW Licensed Clinical Social Worker [name] Care Management [phone]

## 2015-09-17 NOTE — Patient Outreach (Signed)
[facility]) Care Management  09/17/2015  Erin [name] [facility] 06/12/1930 [id number]  Discussed with [name]'s son/HCPOA [name], Mrs. Punch's need for SNF care and progress/collaboration with primary care provider office and our LCSW [name]. [name] still desires SNF placement and her family is engaged and willing to work with facilities.    [facility] Management  [phone]

## 2015-09-23 ENCOUNTER — Other Ambulatory Visit: Payer: Self-pay | Admitting: Licensed Clinical Social Worker

## 2015-09-23 NOTE — Patient Outreach (Signed)
Assessment:  CSW called client home phone number on 09/23/15 and spoke via phone with Erin [name], grandson of client. CSW verified identity of Erin [name]. Erin [name] and CSW spoke of client needs. Erin [name] said client was sleeping at that time and could not come to the phone. Erin [name] said client is eating adequately but that client is sleeping more than she did previously. He said client has to have assistance in standing or walking.. CSW informed Erin [name] that CSW had contacted case manager last week at [facility] to speak of client needs and client and family desire for client to go to a skilled care facility. CSW informed Erin [name] that facility did have client FL-2 form completed by [name].  CSW did speak of current client needs to case manager at facility. Case manager was to share this information with Erin [name], the admissions coordinator at facility.  CSW shared this information with Erin [name]. CSW also informed Erin [name] that CSW had given case manager Erin [name] name and phone number in order that admissions coordinator could call Erin [name] to speak about client needs.  CSW asked Erin [name] to please share the above information with Erin [name].  Erin [name] said he would share the above information with Erin [name] who is client's son and Erin [name] for client.  CSW thanked Erin [name] for phone call with CSW on 09/23/15.   Plan: Client to continue to communicate with her son Erin [name] regarding client level of care issues and regarding nursing facility options for client in the area. CSW to call client in 4 weeks to assess client needs.  Erin [name].Erin [name] MSW, LCSW Licensed Clinical Social Worker [facility] Care Management [phone]

## 2015-09-25 ENCOUNTER — Other Ambulatory Visit: Payer: Self-pay | Admitting: *Deleted

## 2015-09-25 ENCOUNTER — Telehealth: Payer: Self-pay | Admitting: Neurology

## 2015-09-25 NOTE — Patient Outreach (Signed)
[facility]) Care Management  09/25/2015  Erin [name] [facility] Mar 13, 1930 [id number]   Call received today from Mr. [name] LCSW with [facility]. Erin [name] is working diligently to help Erin [name] secure long term SNF placement. Today, he spoke with the admissions coordinator at [facility] which is a preferred facility for Erin [name] and her family. I will continue to work with Erin [name], her family, and Erin [name] to provide any needed support to facilitate SNF placement.    [facility] Management  [phone]

## 2015-09-25 NOTE — Telephone Encounter (Signed)
Pt requesting refill on HYDROcodone-acetaminophen (NORCO/VICODIN) 5-325 MG tablet.Thank you

## 2015-09-26 ENCOUNTER — Telehealth: Payer: Self-pay | Admitting: *Deleted

## 2015-09-26 ENCOUNTER — Other Ambulatory Visit: Payer: Self-pay | Admitting: *Deleted

## 2015-09-26 MED ORDER — HYDROCODONE-ACETAMINOPHEN 5-325 MG PO TABS: 1.0000 | ORAL_TABLET | Freq: Four times a day (QID) | ORAL | Status: DC | PRN

## 2015-09-26 NOTE — Patient Outreach (Signed)
[facility]) Care Management  09/26/2015  Malanii Pruneau [facility] Sep 19, 1929 [id number]   Call received from Cleon Dew  [phone]), admissions coordinator at [facility] this afternoon. Mr. Jodell Cipro has reviewed FL-2 and called to request contact information for Mrs. Whipp and her family. I provided requested information after checking with Mrs. Ecklund's son/HCPOA [name].    [facility] Management  [phone]

## 2015-09-26 NOTE — Telephone Encounter (Signed)
Erin [name] picked up prescription for hydrocodone.

## 2015-10-01 ENCOUNTER — Other Ambulatory Visit: Payer: Self-pay | Admitting: Neurology

## 2015-10-02 ENCOUNTER — Other Ambulatory Visit: Payer: Self-pay | Admitting: *Deleted

## 2015-10-02 NOTE — Patient Outreach (Signed)
[facility]) Care Management  10/02/2015  Erin [name] [facility] February 03, 1930 [id number]  Call received this morning from Erin [name] asking if I would come see her today because she had "the worst stomach pain and nausea ever in her life". On my way to visit [name], she called to say she didn't want me to come because she'd had a bowel movement and felt well now.     [facility] Management  [phone]

## 2015-10-03 ENCOUNTER — Ambulatory Visit (INDEPENDENT_AMBULATORY_CARE_PROVIDER_SITE_OTHER): Payer: Medicare Other | Admitting: Urology

## 2015-10-03 DIAGNOSIS — N3020 Other chronic cystitis without hematuria: Secondary | ICD-10-CM | POA: Diagnosis not present

## 2015-10-03 DIAGNOSIS — N393 Stress incontinence (female) (male): Secondary | ICD-10-CM

## 2015-10-04 ENCOUNTER — Other Ambulatory Visit: Payer: Self-pay | Admitting: Neurology

## 2015-10-06 ENCOUNTER — Telehealth: Payer: Self-pay | Admitting: Neurology

## 2015-10-06 ENCOUNTER — Other Ambulatory Visit: Payer: Self-pay | Admitting: Neurology

## 2015-10-06 ENCOUNTER — Other Ambulatory Visit: Payer: Self-pay | Admitting: *Deleted

## 2015-10-06 MED ORDER — ALPRAZOLAM 0.5 MG PO TABS: 0.7500 mg | ORAL_TABLET | Freq: Every evening | ORAL | Status: DC | PRN

## 2015-10-06 NOTE — Patient Outreach (Signed)
[facility]) Care Management  10/06/2015  Karthika Molyneux [facility] 1930-01-15 [id number]  Call received from Mrs. Gaynelle Adu and her grandson/caregiver [name] Kama today. Mrs. Quiggle reports "shaking all over and I can't stop it".   Mrs. Oyervides took her last Xanax on 09/30/15. Her next refill date was 10/04/15. When her grandson called in her refill requests and went to the drug store to pick up her medications, he was told that authorization was not provided for refill on her Xanax. The drug store called Dr. Jannifer Franklin' office and, per [name], the pharmacist told him that the provider office would not authorize refills on Xanax until the patient came in on 10/08/15 for her scheduled visit with Dr. Jannifer Franklin. [name] and Mrs. Deveny asked if I would try to reach Dr. Jannifer Franklin with information about her generalized "shaking".   Mrs. Coello was alert and oriented during my conversation with her. She denied pain but complained of uncontrollable shaking.   I called Dr. Jannifer Franklin' office and left a message for Dr. Jannifer Franklin' [place] or Nurse, requesting Dr. Jannifer Franklin' recommendation.   I notified Mrs.Zimny and her grandson [name] of my outreach to Dr. Jannifer Franklin' office.    [facility] Management  [phone]

## 2015-10-06 NOTE — Telephone Encounter (Signed)
I will call in a refill for the alprazolam.

## 2015-10-06 NOTE — Telephone Encounter (Signed)
[name] is calling in regard to the patient's Rx alprozolam(XANAX)0.5 mg tablets. She states that the patient is saying that she is has uncontrollable shaking and needs a few  tablets just to get her thru until she sees Dr. Jannifer Franklin on 3/29 sent to [facility].  Thanks!

## 2015-10-07 ENCOUNTER — Telehealth: Payer: Self-pay | Admitting: *Deleted

## 2015-10-07 MED ORDER — APIXABAN 5 MG PO TABS: 5.0000 mg | ORAL_TABLET | Freq: Two times a day (BID) | ORAL | Status: AC

## 2015-10-07 NOTE — Telephone Encounter (Signed)
The Eliquis was reordered.

## 2015-10-07 NOTE — Telephone Encounter (Signed)
Request for eliquis refill.

## 2015-10-08 ENCOUNTER — Encounter: Payer: Self-pay | Admitting: Neurology

## 2015-10-08 ENCOUNTER — Other Ambulatory Visit: Payer: Self-pay | Admitting: Licensed Clinical Social Worker

## 2015-10-08 ENCOUNTER — Ambulatory Visit (INDEPENDENT_AMBULATORY_CARE_PROVIDER_SITE_OTHER): Payer: Medicare Other | Admitting: Neurology

## 2015-10-08 VITALS — BP 123/84 | HR 61 | Ht 64.0 in | Wt 126.5 lb

## 2015-10-08 DIAGNOSIS — R29898 Other symptoms and signs involving the musculoskeletal system: Secondary | ICD-10-CM

## 2015-10-08 DIAGNOSIS — G63 Polyneuropathy in diseases classified elsewhere: Secondary | ICD-10-CM | POA: Diagnosis not present

## 2015-10-08 DIAGNOSIS — I6930 Unspecified sequelae of cerebral infarction: Secondary | ICD-10-CM | POA: Diagnosis not present

## 2015-10-08 DIAGNOSIS — G250 Essential tremor: Secondary | ICD-10-CM

## 2015-10-08 DIAGNOSIS — R569 Unspecified convulsions: Secondary | ICD-10-CM | POA: Diagnosis not present

## 2015-10-08 DIAGNOSIS — G40109 Localization-related (focal) (partial) symptomatic epilepsy and epileptic syndromes with simple partial seizures, not intractable, without status epilepticus: Secondary | ICD-10-CM | POA: Diagnosis not present

## 2015-10-08 DIAGNOSIS — R269 Unspecified abnormalities of gait and mobility: Secondary | ICD-10-CM | POA: Diagnosis not present

## 2015-10-08 HISTORY — DX: Unspecified convulsions: R56.9

## 2015-10-08 MED ORDER — MIRTAZAPINE 30 MG PO TABS: 30.0000 mg | ORAL_TABLET | Freq: Every day | ORAL | Status: AC

## 2015-10-08 NOTE — Progress Notes (Signed)
Reason for visit: Left hemiparesis  ANALESE LAMAY is an 82 y.o. female  History of present illness:  Ms. Giordani is an 82 year old right-handed white female with a history of a right brain stroke and a left hemiparesis. The patient has had focal seizures, currently on Keppra. She has involuntary movements of the left leg at times. The patient has had burning and stinging sensations in the feet, left greater than right. She has been on gabapentin taking 100 mg twice during the day and 300 mg in the evening. She is on Remeron taking 45 mg at night, she sleeps throughout the night, usually is unable to get fully awake until around noon, and then takes a couple hours to get fully alert mentally after that point. The patient has a "dropped head syndrome". She has some difficulty chewing and swallowing, the family has to pure some of the food at times. The patient will be able to ambulate with some assistance short distances. She has not had any recent falls. She still has significant pain in the left foot, primarily on the fifth toe. The Lidoderm patches were not approved through her insurance, and lidocaine gel was not completely effective. She returns to this office for an evaluation.  Past Medical History  Diagnosis Date  . Anxiety   . Arthritis   . Hyperlipidemia   . Essential hypertension, benign   . Osteoporosis   . GERD (gastroesophageal reflux disease)   . Hearing loss   . Restless leg   . Tremor   . Leg edema   . Obese   . Gall bladder disease   . Cataract   . Carpal tunnel syndrome of left wrist   . Atrial fibrillation ([place])   . Sleep apnea   . Esophageal dilatation   . Frequent falls   . History of nuclear stress test 08/31/2012    Lexiscan cardiolite negative for ischemia  . Polyneuropathy in other diseases classified elsewhere ([place]) 10/03/2013  . Cardioembolic stroke ([place]) [id number]  . UTI (lower urinary tract infection)   . History of stroke with current residual  effects 11/20/2014  . Seizures ([place])   . Focal motor seizure disorder ([place]) 12/24/2014    Left leg involvment  . Sequela, post-stroke 04/04/2015     Hemiparesis, nondominant hemisphere  . Focal seizures ([place]) 10/08/2015    Past Surgical History  Procedure Laterality Date  . Appendectomy    . Cholecystectomy    . Abdominal hysterectomy    . Fracture surgery      Left arm x4  . Benign cyst removed from kidney and lung, bilateral mastectomy    . Bilateral great toenail removal    . Knee arthroscopy  2012    Right knee, Dr Aline Brochure  . Left forearm    . Breast surgery      Bilateral 2000  . Tonsillectomy    . R hand surgery    . Bronchoscopy  02/15/2000  . Right vats.  "  . Right upper lobe wedge resection.  "  . Upper gastrointestinal endoscopy  11/25/1999    Esophagitis/ Normal proximal esophagus, stomach and duodenum  . Colonoscopy  01/17/2009    Dr. Deatra Ina : Internal hemorrhoids/Diverticula, scattered in the ascending colon/  Moderate diverticulosis ascending colon to sigmoid colon  . Esophagogastroduodenoscopy   04/23/2003    Dr. Deatra Ina: HIATAL HERNIA  . Colonoscopy  01/16/2001    Normal  . Cataract extraction, bilateral    . Esophagogastroduodenoscopy  03/20/2012  [id number] ring-LIKELY CAUSING MILD DYSPHAGIA/SMALL hiatal hernia/Multiple sessile polyps ranging between 3-79mm , path benign  . Cataract extraction    . Carpal tunnel release      Left wrist  . Transthoracic echocardiogram  06/24/2009    EF=>55%, mild assymetric LVH; LA mildly dilated; mild mitral annular calcif, borderline MVP, mild-mod MR; mild-mod TR, RV systolic pressure elevated, mild pulm HTN; AV mildly sclerotic; mild pulm valve regurg - ordered r/t bradycardia   . Endovenous ablation saphenous vein w/ laser  01/2011    Right GSV  . Cholecystectomy    . Intramedullary (im) nail intertrochanteric Left 03/25/2014    Procedure: INTRAMEDULLARY NAIL INTERTROCHANTRIC LEFT HIP;  Surgeon: Marianna Payment,  MD;  Location: [facility];  Service: Orthopedics;  Laterality: Left;    Family History  Problem Relation Age of Onset  . Diabetes Mother   . Heart disease Mother   . Stroke Mother   . Cancer Sister     KIDNEY  . Emphysema Sister   . Heart disease Brother   . Heart disease Sister   . Emphysema Sister   . Cancer Sister     BREAST  . Heart disease Brother   . Colon cancer Sister   . Alzheimer's disease Father   . Heart disease Sister   . Tremor Sister   . Tremor Brother   . Alcoholism Child   . Cancer Brother   . Pancreatic cancer Brother   . Diabetes Son     Social history:  reports that she has never smoked. She has never used smokeless tobacco. She reports that she does not drink alcohol or use illicit drugs.    Allergies  Allergen Reactions  . Codeine Hives  . Morphine And Related Itching and Other (See Comments)    Makes pt hallucinate  . Actonel [Risedronate Sodium] Other (See Comments)    Abdominal pain  . Fosamax [Alendronate Sodium] Other (See Comments)    Abdominal pian  . Losartan Other (See Comments)    Hospitalized in 06/2013 with pancreatitis, losartan is associated with increased risk of pancreatitis ,  Hence discontinued  . Nitrofurantoin Other (See Comments)    Hospitalized with pancreatitis in 06/2013. Nitrofurantoin implicated as a possible cause  . Penicillins     Medications:  Prior to Admission medications   Medication Sig Start Date End Date Taking? Authorizing Provider  acetaminophen (TYLENOL) 500 MG tablet Take 1,000 mg by mouth every 6 (six) hours as needed for mild pain or moderate pain.   Yes Historical Provider, MD  ALPRAZolam Duanne Moron) 0.5 MG tablet Take 1.5 tablets (0.75 mg total) by mouth at bedtime as needed for anxiety. 10/06/15  Yes Kathrynn Ducking, MD  amLODipine (NORVASC) 10 MG tablet TAKE ONE TABLET BY MOUTH ONCE DAILY 07/04/15  Yes Fayrene Helper, MD  apixaban (ELIQUIS) 5 MG TABS tablet Take 1 tablet (5 mg total) by mouth 2 (two)  times daily. 10/07/15  Yes Kathrynn Ducking, MD  atorvastatin (LIPITOR) 20 MG tablet Take 1 tablet by mouth daily. 12/20/14  Yes Historical Provider, MD  busPIRone (BUSPAR) 5 MG tablet TAKE ONE TABLET BY MOUTH TWICE DAILY 11/25/14  Yes Fayrene Helper, MD  cetirizine (ZYRTEC) 10 MG tablet Take 10 mg by mouth daily.   Yes Historical Provider, MD  donepezil (ARICEPT) 10 MG tablet TAKE ONE TABLET BY MOUTH ONCE DAILY AT BEDTIME Patient taking differently: TAKE ONE TABLET BY MOUTH ONCE DAILY AT AM 09/01/15  Yes Fayrene Helper, MD  doxylamine, Sleep, (UNISOM) 25 MG tablet Take 25 mg by mouth at bedtime as needed.   Yes Historical Provider, MD  esomeprazole (NEXIUM) 20 MG packet Take 20 mg by mouth daily before breakfast. 03/31/15  Yes Fayrene Helper, MD  gabapentin (NEURONTIN) 100 MG capsule TAKE ONE CAPSULE BY MOUTH ONCE DAILY IN THE MORNING AND TWO AT [facility] AND THREE IN THE EVENING 10/02/15  Yes Kathrynn Ducking, MD  HYDROcodone-acetaminophen (NORCO/VICODIN) 5-325 MG tablet Take 1 tablet by mouth every 6 (six) hours as needed for moderate pain. 09/26/15  Yes Kathrynn Ducking, MD  levETIRAcetam (KEPPRA) 750 MG tablet TAKE ONE TABLET BY MOUTH TWICE DAILY 09/02/15  Yes Kathrynn Ducking, MD  lidocaine (XYLOCAINE) 5 % ointment Apply 1 application topically daily as needed. Apply to left foot, low back as needed 08/05/15  Yes Kathrynn Ducking, MD  metoprolol (LOPRESSOR) 50 MG tablet TAKE ONE-HALF TABLET BY MOUTH THREE TIMES DAILY 07/15/15  Yes Fayrene Helper, MD  montelukast (SINGULAIR) 10 MG tablet TAKE ONE TABLET BY MOUTH AT BEDTIME 07/15/15  Yes Fayrene Helper, MD  polyethylene glycol [facility] / GLYCOLAX) packet Take 17 g by mouth daily. Patient taking differently: Take 17 g by mouth daily as needed for mild constipation.  05/05/15  Yes Carole Civil, MD  silver sulfADIAZINE (SILVADENE) 1 % cream Apply 1 application topically daily. 05/28/15  Yes Fayrene Helper, MD  Vitamin D,  Ergocalciferol, (DRISDOL) 50000 units CAPS capsule TAKE 1 CAPSULE BY MOUTH EVERY 7 DAYS. 08/12/15  Yes Fayrene Helper, MD  zolpidem (AMBIEN) 5 MG tablet Take 1 tablet (5 mg total) by mouth at bedtime as needed for sleep. 04/01/15  Yes Kathrynn Ducking, MD  mirtazapine (REMERON) 30 MG tablet Take 1 tablet (30 mg total) by mouth at bedtime. 10/08/15   Kathrynn Ducking, MD    ROS:  Out of a complete 14 system review of symptoms, the patient complains only of the following symptoms, and all other reviewed systems are negative.  Runny nose, difficulties swallowing, drooling Cough, choking Nausea Restless legs, insomnia, daytime sleepiness, sleep talking Frequency of urination Joint pain, joint swelling, back pain, walking difficulty Moles, itching Bruising easily Headache, speech difficulty, weakness, tremors, facial drooping Depression, anxiety  Blood pressure 123/84, pulse 61, height 5\' 4"  (1.626 m), weight 126 lb 8 oz (57.38 kg).  Physical Exam  General: The patient is alert and cooperative at the time of the examination.  Skin: 2+ edema of ankles is noted bilaterally..   Neurologic Exam  Mental status: The patient is alert and oriented x 3 at the time of the examination. The patient has apparent normal recent and remote memory, with an apparently normal attention span and concentration ability.   Cranial nerves: Facial symmetry is present. Speech is slightly dysarthric, not aphasic. Extraocular movements are full. Visual fields are full. The patient sits with the head flexed, unable to fully extend the neck.  Motor: The patient has good strength in the right extremities. On the left side, the patient has 4/5 grip strength, 3/5 biceps and triceps strength, unable to keep the left arm above the head against gravity. The patient has 4/5 strength in the left leg.  Sensory examination: Soft touch sensation is symmetric on the face and arms, slightly decreased on the left  leg.  Coordination: The patient has good finger-nose-finger on the right, unable to perform well on the left. An intention tremor seen with the right greater than left upper  extremities. The patient is not able to perform heel-to-shin well on either side.  Gait and station: The patient has a limited gait. She comes in a wheelchair today.   Reflexes: Deep tendon reflexes are symmetric.   Assessment/Plan:  1. Right brain stroke, left hemiparesis  2. Gait disorder  3. History of focal seizures  4. Chronic pain  The patient will be reduced on the Remeron taking 30 mg at night, the patient has significant drowsiness. The patient appears to have a "dropped head syndrome", and we will use a [place] collar to see if this helps. The patient will follow-up through this office in about 6 months.  Jill Alexanders MD 10/08/2015 8:53 PM  [facility] [address] [place], [place] [address]  Phone [phone] Fax [phone]

## 2015-10-08 NOTE — Patient Outreach (Signed)
Assessment:  CSW called [name], son of client, on 10/08/15 and spoke via phone with [name].  CSW verified identity of Brexley Perovich. [name] and [place] spoke of client needs. [name] said client has appointment on 10/08/15 with Dr. Jannifer Franklin, neurologist, in [place], [place]. [name] said client was eating adequately and resting adequately. He said client was not receiving in home aide support at present. He said family members are assisting client daily with care needs of client. He and CSW spoke of [facility] facility in [place], [place]. [name] said that he received a call recently from [place] of the [facility] in [place], [place].  [name] said he talked with Gerald Stabs about care needs of client. [name] said he is still communicating with Medicaid caseworker at [employer] related to [facility] application for client.  Client receives [place] support through [employer].  Regarding client care plan, CSW encouraged for client to continue to cooperate daily with care providers assisting client with daily care needs.  CSW thanked [name] for phone conversation with CSW on 10/08/15. CSW encouraged [name] to call CSW at [phone] as needed to discuss social work needs of client.  Plan:  Client to continue to cooperate daily for the next 30 days with care providers assisting client in the home. CSW to call client in 3 weeks to assess needs of client.  [name].Esmae Donathan MSW, LCSW Licensed Clinical Social Worker [facility] Care Management [phone]

## 2015-10-14 ENCOUNTER — Other Ambulatory Visit: Payer: Self-pay | Admitting: *Deleted
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
## 2015-10-15 ENCOUNTER — Encounter: Payer: Self-pay | Admitting: *Deleted

## 2015-10-15 NOTE — Progress Notes (Signed)
This encounter was created in error - please disregard.

## 2015-10-23 ENCOUNTER — Ambulatory Visit: Payer: Self-pay | Admitting: Family Medicine

## 2015-10-24 ENCOUNTER — Ambulatory Visit: Payer: Self-pay | Admitting: Licensed Clinical Social Worker

## 2015-10-25 ENCOUNTER — Other Ambulatory Visit: Payer: Self-pay | Admitting: Family Medicine

## 2015-10-28 ENCOUNTER — Encounter: Payer: Self-pay | Admitting: Family Medicine

## 2015-10-28 ENCOUNTER — Ambulatory Visit (INDEPENDENT_AMBULATORY_CARE_PROVIDER_SITE_OTHER): Payer: Medicare Other | Admitting: Family Medicine

## 2015-10-28 ENCOUNTER — Other Ambulatory Visit: Payer: Self-pay

## 2015-10-28 VITALS — BP 120/80 | HR 100 | Resp 18 | Ht 66.0 in

## 2015-10-28 DIAGNOSIS — I482 Chronic atrial fibrillation, unspecified: Secondary | ICD-10-CM

## 2015-10-28 DIAGNOSIS — M48061 Spinal stenosis, lumbar region without neurogenic claudication: Secondary | ICD-10-CM

## 2015-10-28 DIAGNOSIS — F411 Generalized anxiety disorder: Secondary | ICD-10-CM

## 2015-10-28 DIAGNOSIS — F0391 Unspecified dementia with behavioral disturbance: Secondary | ICD-10-CM | POA: Diagnosis not present

## 2015-10-28 DIAGNOSIS — F4321 Adjustment disorder with depressed mood: Secondary | ICD-10-CM

## 2015-10-28 DIAGNOSIS — M4806 Spinal stenosis, lumbar region: Secondary | ICD-10-CM

## 2015-10-28 DIAGNOSIS — Z634 Disappearance and death of family member: Principal | ICD-10-CM

## 2015-10-28 DIAGNOSIS — I10 Essential (primary) hypertension: Secondary | ICD-10-CM

## 2015-10-28 DIAGNOSIS — R21 Rash and other nonspecific skin eruption: Secondary | ICD-10-CM

## 2015-10-28 DIAGNOSIS — F03918 Unspecified dementia, unspecified severity, with other behavioral disturbance: Secondary | ICD-10-CM

## 2015-10-28 DIAGNOSIS — G250 Essential tremor: Secondary | ICD-10-CM

## 2015-10-28 MED ORDER — BUSPIRONE HCL 5 MG PO TABS: 5.0000 mg | ORAL_TABLET | Freq: Two times a day (BID) | ORAL | Status: DC

## 2015-10-28 MED ORDER — AMLODIPINE BESYLATE 10 MG PO TABS
10.0000 mg | ORAL_TABLET | Freq: Every day | ORAL | Status: DC
Start: 2015-10-28 — End: 2016-02-27

## 2015-10-28 MED ORDER — BUSPIRONE HCL 5 MG PO TABS: ORAL_TABLET | ORAL | Status: DC

## 2015-10-28 NOTE — Patient Instructions (Signed)
F/u in 8 weeks, call if you need me sooner  Increase in buspar to one tablet 3 times daily for anxiety  Our continued love, support and prayers  Continue to use cream to red area on back, it is not infected, do not use too hot a heating pad on your back   We will assist in tryiong to get the collar for your neck that fits, nurse will call [employer]

## 2015-10-29 ENCOUNTER — Other Ambulatory Visit: Payer: Self-pay | Admitting: Licensed Clinical Social Worker
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
## 2015-10-30 ENCOUNTER — Encounter: Payer: Self-pay | Admitting: Family Medicine

## 2015-10-30 ENCOUNTER — Telehealth: Payer: Self-pay | Admitting: Family Medicine

## 2015-10-30 ENCOUNTER — Telehealth: Payer: Self-pay | Admitting: Neurology

## 2015-10-30 DIAGNOSIS — Z634 Disappearance and death of family member: Principal | ICD-10-CM

## 2015-10-30 DIAGNOSIS — R21 Rash and other nonspecific skin eruption: Secondary | ICD-10-CM | POA: Insufficient documentation

## 2015-10-30 DIAGNOSIS — F4321 Adjustment disorder with depressed mood: Secondary | ICD-10-CM | POA: Insufficient documentation

## 2015-10-30 MED ORDER — HYDROCODONE-ACETAMINOPHEN 5-325 MG PO TABS: 1.0000 | ORAL_TABLET | Freq: Four times a day (QID) | ORAL | Status: DC | PRN

## 2015-10-30 NOTE — Assessment & Plan Note (Signed)
Severe, unsteady gait and limited mobility, essentially wheelchair bound
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
## 2015-10-30 NOTE — Assessment & Plan Note (Signed)
Rate controlled on medication, however, pt committed to lifelong anti coagulant due to recent CVA

## 2015-10-30 NOTE — Telephone Encounter (Signed)
Pls contact [facility]A to see if we can be of any help in trying to locate the cervical collar ordered by her neurologist Dr Jannifer Franklin for her , family states they have been unable to get one that fits, specifics of the product are on the written script which the pharmacy and the family have Thanks!

## 2015-10-30 NOTE — Telephone Encounter (Signed)
Rx printed, signed, up front for pick-up. 
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
## 2015-10-30 NOTE — Telephone Encounter (Signed)
The hydrocodone prescription was refilled.
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
## 2015-10-30 NOTE — Telephone Encounter (Signed)
Patient's grandson is calling to get a written Rx for HYDROcodone-acetaminophen (NORCO/VICODIN) 5-325 MG tablet. I advised the Rx will be ready in 24 hours unless the nurse advises otherwise.

## 2015-10-30 NOTE — Assessment & Plan Note (Signed)
Appears to be less severe at this visit

## 2015-10-30 NOTE — Assessment & Plan Note (Signed)
Increased due to grief, buspar dose is increased

## 2015-10-30 NOTE — Assessment & Plan Note (Signed)
Controlled, no change in medication  

## 2015-10-30 NOTE — Assessment & Plan Note (Signed)
Erythematous macular rash on lower back, barrier cream to continue to be applied, and warned against excessive use of heating pad

## 2015-10-31 ENCOUNTER — Other Ambulatory Visit: Payer: Self-pay | Admitting: Family Medicine

## 2015-10-31 ENCOUNTER — Other Ambulatory Visit: Payer: Self-pay | Admitting: Neurology

## 2015-11-01 ENCOUNTER — Other Ambulatory Visit: Payer: Self-pay | Admitting: Family Medicine

## 2015-11-05 ENCOUNTER — Telehealth: Payer: Self-pay | Admitting: Neurology

## 2015-11-05 ENCOUNTER — Other Ambulatory Visit: Payer: Self-pay

## 2015-11-05 NOTE — Telephone Encounter (Signed)
Talked to grandson and let him know that pt has a refill available on Xanax. However, he said that pharmacy would not fill. Spoke to pharmacy who said that pt does have a refill that can be picked up on Friday. Grandson notified, verbalized understanding and appreciation for call.

## 2015-11-05 NOTE — Telephone Encounter (Signed)
Spoke with family and notified that I would try to find a supplier for [facility] collar.   [facility] and they only supply a universal cervical collar that is not billable to insurance.   [facility] does supply but patient will need to come in for a fitting in which grandson says that they have already done and Apothe[facility]ary was unable to fit her properly for that collar.   Will speak with family and give an update.  They may need to contact Dr. Jannifer Franklin for other options to the collar.

## 2015-11-05 NOTE — Telephone Encounter (Signed)
[name], pts grandson, called to ask for another rx of ALPRAZolam (XANAX) 0.5 MG tablet,. Pts son passed away and pt has taken more than normal. Please call and advise

## 2015-11-06 ENCOUNTER — Other Ambulatory Visit: Payer: Self-pay | Admitting: Neurology

## 2015-11-07 ENCOUNTER — Other Ambulatory Visit: Payer: Self-pay | Admitting: *Deleted
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
## 2015-11-11 ENCOUNTER — Other Ambulatory Visit: Payer: Self-pay | Admitting: *Deleted

## 2015-11-11 ENCOUNTER — Encounter: Payer: Self-pay | Admitting: *Deleted
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
## 2015-11-13 ENCOUNTER — Other Ambulatory Visit: Payer: Self-pay | Admitting: Family Medicine

## 2015-11-19 ENCOUNTER — Other Ambulatory Visit: Payer: Self-pay | Admitting: Licensed Clinical Social Worker

## 2015-11-19 NOTE — Patient Outreach (Signed)
Assessment:  CSW spoke via phone with Erin [name], grandson of client, on 11/19/15. CSW verified identity of Erin [name]. CSW and Erin spoke of client needs.  Client receives daily care from care providers with activities of daily living.  Family is very supportive of client.  Client has prescribed medications and is taking medication as prescribed.  Client has expressed grief issues related to recent death of client's son, Erin [name]. CSW had spoken via phone previously with Erin [name] about support from chaplain through [facility] [facility]. CSW had informed Erin [name] previously  that [facility] has grief support groups and family support groups that meet at scheduled times to talk with persons experiencing grief symptoms.  Client has mobility issues and at present has to have assistance with short distance ambulation. Client also has to have assistance with most activities of daily living. Erin [name], son of client, is Erin [name] of Attorney for client.  Client sees Erin [name] as primary care doctor for client.  Client had appointment with Erin [name] last month.  Client watches TV to relax.  Client enjoys talking with family members to help her relax.  She enjoys visits from friends in the home. Client's sister visited with client in the home this week.  CSW again spoke with Erin about grief support resources for client in the area.  CSW reminded Erin that client could speak with Erin [name] about grief issues experienced by client.  CSW again spoke with Erin about grief support group resources at [facility] [place], [place] again encouraged for client to attend all scheduled client medical appointments in next 30 days.  CSW encouraged that client or Erin [name] or Erin [name] call CSW at [phone] as needed to discuss social work needs of client.   Plan:  Client to attend all scheduled client medical appointments in next 30 days. CSW to  collaborate with RN Erin [name] in monitoring needs of client. CSW to call client/Erin [name] in 4 weeks to assess needs of client.  [name].Erin [name] MSW, LCSW Licensed Clinical Social Worker [facility] Care Management [phone]

## 2015-11-21 ENCOUNTER — Emergency Department (HOSPITAL_COMMUNITY): Payer: Medicare Other

## 2015-11-21 ENCOUNTER — Encounter (HOSPITAL_COMMUNITY): Payer: Self-pay | Admitting: *Deleted

## 2015-11-21 ENCOUNTER — Inpatient Hospital Stay (HOSPITAL_COMMUNITY)
Admission: EM | Admit: 2015-11-21 | Discharge: 2015-11-23 | DRG: 194 | Disposition: A | Discharge status: Home / Self Care | Payer: Medicare Other | Attending: Internal Medicine | Admitting: Internal Medicine

## 2015-11-21 DIAGNOSIS — I482 Chronic atrial fibrillation, unspecified: Secondary | ICD-10-CM | POA: Diagnosis present

## 2015-11-21 DIAGNOSIS — M810 Age-related osteoporosis without current pathological fracture: Secondary | ICD-10-CM | POA: Diagnosis present

## 2015-11-21 DIAGNOSIS — J189 Pneumonia, unspecified organism: Principal | ICD-10-CM | POA: Diagnosis present

## 2015-11-21 DIAGNOSIS — E785 Hyperlipidemia, unspecified: Secondary | ICD-10-CM | POA: Diagnosis present

## 2015-11-21 DIAGNOSIS — G8929 Other chronic pain: Secondary | ICD-10-CM | POA: Diagnosis present

## 2015-11-21 DIAGNOSIS — Z66 Do not resuscitate: Secondary | ICD-10-CM | POA: Diagnosis present

## 2015-11-21 DIAGNOSIS — R269 Unspecified abnormalities of gait and mobility: Secondary | ICD-10-CM

## 2015-11-21 DIAGNOSIS — M48061 Spinal stenosis, lumbar region without neurogenic claudication: Secondary | ICD-10-CM | POA: Diagnosis present

## 2015-11-21 DIAGNOSIS — R109 Unspecified abdominal pain: Secondary | ICD-10-CM

## 2015-11-21 DIAGNOSIS — M4806 Spinal stenosis, lumbar region: Secondary | ICD-10-CM | POA: Diagnosis present

## 2015-11-21 DIAGNOSIS — I69354 Hemiplegia and hemiparesis following cerebral infarction affecting left non-dominant side: Secondary | ICD-10-CM

## 2015-11-21 DIAGNOSIS — K219 Gastro-esophageal reflux disease without esophagitis: Secondary | ICD-10-CM | POA: Diagnosis present

## 2015-11-21 DIAGNOSIS — G4730 Sleep apnea, unspecified: Secondary | ICD-10-CM | POA: Diagnosis present

## 2015-11-21 DIAGNOSIS — I10 Essential (primary) hypertension: Secondary | ICD-10-CM | POA: Diagnosis present

## 2015-11-21 DIAGNOSIS — R569 Unspecified convulsions: Secondary | ICD-10-CM

## 2015-11-21 LAB — CBC WITH DIFFERENTIAL/PLATELET
Basophils Absolute: 0 10*3/uL (ref 0.0–0.1)
Basophils Relative: 0 %
EOS ABS: 0.2 10*3/uL (ref 0.0–0.7)
EOS PCT: 2 %
HCT: 41.6 % (ref 36.0–46.0)
Hemoglobin: 13.4 g/dL (ref 12.0–15.0)
LYMPHS ABS: 2.9 10*3/uL (ref 0.7–4.0)
Lymphocytes Relative: 28 %
MCH: 32.4 pg (ref 26.0–34.0)
MCHC: 32.2 g/dL (ref 30.0–36.0)
MCV: 100.7 fL — ABNORMAL HIGH (ref 78.0–100.0)
MONOS PCT: 6 %
Monocytes Absolute: 0.6 10*3/uL (ref 0.1–1.0)
Neutro Abs: 6.5 10*3/uL (ref 1.7–7.7)
Neutrophils Relative %: 64 %
PLATELETS: 203 10*3/uL (ref 150–400)
RBC: 4.13 MIL/uL (ref 3.87–5.11)
RDW: 13.2 % (ref 11.5–15.5)
WBC: 10.2 10*3/uL (ref 4.0–10.5)

## 2015-11-21 LAB — COMPREHENSIVE METABOLIC PANEL
ALK PHOS: 77 U/L (ref 38–126)
ALT: 12 U/L — AB (ref 14–54)
AST: 22 U/L (ref 15–41)
Albumin: 3.8 g/dL (ref 3.5–5.0)
Anion gap: 7 (ref 5–15)
BUN: 11 mg/dL (ref 6–20)
CHLORIDE: 104 mmol/L (ref 101–111)
CO2: 28 mmol/L (ref 22–32)
CREATININE: 0.4 mg/dL — AB (ref 0.44–1.00)
Calcium: 8.7 mg/dL — ABNORMAL LOW (ref 8.9–10.3)
GFR calc Af Amer: >60 mL/min (ref >60)
Glucose, Bld: 106 mg/dL — ABNORMAL HIGH (ref 65–99)
Potassium: 3.5 mmol/L (ref 3.5–5.1)
Sodium: 139 mmol/L (ref 135–145)
Total Bilirubin: 0.4 mg/dL (ref 0.3–1.2)
Total Protein: 7 g/dL (ref 6.5–8.1)

## 2015-11-21 LAB — URINE MICROSCOPIC-ADD ON

## 2015-11-21 LAB — URINALYSIS, ROUTINE W REFLEX MICROSCOPIC
Bilirubin Urine: NEGATIVE
GLUCOSE, UA: NEGATIVE mg/dL
KETONES UR: NEGATIVE mg/dL
Nitrite: NEGATIVE
PROTEIN: NEGATIVE mg/dL
Specific Gravity, Urine: 1.01 (ref 1.005–1.030)
pH: 7 (ref 5.0–8.0)

## 2015-11-21 LAB — I-STAT CG4 LACTIC ACID, ED: LACTIC ACID, VENOUS: 1.56 mmol/L (ref 0.5–2.0)

## 2015-11-21 MED ORDER — FENTANYL CITRATE (PF) 100 MCG/2ML IJ SOLN
25.0000 ug | Freq: Once | INTRAMUSCULAR | Status: AC
  Administered 2015-11-21: 25 ug via INTRAVENOUS
  Filled 2015-11-21: qty 2

## 2015-11-21 NOTE — ED Notes (Addendum)
EMS reports difficulty urinating x 2 days and right flank pain starting to day. Pt had a uti within the last month or so. Pt reports taking a hydrocodone within the last hour.

## 2015-11-21 NOTE — ED Provider Notes (Signed)
CSN: [id number]     Arrival date & time 11/21/15  2127 History  By signing my name below, I, [facility], attest that this documentation has been prepared under the direction and in the presence of Sherwood Gambler, MD. Electronically Signed: Virgel Bouquet, ED Scribe. 11/21/2015. 9:58 PM.   Chief Complaint  Patient presents with  . Flank Pain   The history is provided by the patient and a relative. No language interpreter was used.  HPI Comments: NAN RUMBERGER is a 82 y.o. female with an hx of CVA with permanent left-sided deficits who presents to the Emergency Department complaining of constant, moderate, right-sided flank pain that worsened in severity tonight, onset 2 days. She states that the pain radiates around to her right abdomen. She reports urgency, decreased urine, nausea, mild bilateral leg swelling worse than normal. She has been drinking water today and taken hydrocodone and cranberry pills without relief. Son reports an hx of UTIs, most recently last year, but that current pain is worse in severity. Denies emesis, cough different from baseline, SOB, difficulty breathing, or any other symptoms.  Past Medical History  Diagnosis Date  . Anxiety   . Arthritis   . Hyperlipidemia   . Essential hypertension, benign   . Osteoporosis   . GERD (gastroesophageal reflux disease)   . Hearing loss   . Restless leg   . Tremor   . Leg edema   . Obese   . Gall bladder disease   . Cataract   . Carpal tunnel syndrome of left wrist   . Atrial fibrillation ([place])   . Sleep apnea   . Esophageal dilatation   . Frequent falls   . History of nuclear stress test 08/31/2012    Lexiscan cardiolite negative for ischemia  . Polyneuropathy in other diseases classified elsewhere ([place]) 10/03/2013  . Cardioembolic stroke ([place]) [id number]  . UTI (lower urinary tract infection)   . History of stroke with current residual effects 11/20/2014  . Seizures ([place])   . Focal motor seizure disorder  ([place]) 12/24/2014    Left leg involvment  . Sequela, post-stroke 04/04/2015     Hemiparesis, nondominant hemisphere  . Focal seizures ([place]) 10/08/2015   Past Surgical History  Procedure Laterality Date  . Appendectomy    . Cholecystectomy    . Abdominal hysterectomy    . Fracture surgery      Left arm x4  . Benign cyst removed from kidney and lung, bilateral mastectomy    . Bilateral great toenail removal    . Knee arthroscopy  2012    Right knee, Dr Aline Brochure  . Left forearm    . Breast surgery      Bilateral 2000  . Tonsillectomy    . R hand surgery    . Bronchoscopy  02/15/2000  . Right vats.  "  . Right upper lobe wedge resection.  "  . Upper gastrointestinal endoscopy  11/25/1999    Esophagitis/ Normal proximal esophagus, stomach and duodenum  . Colonoscopy  01/17/2009    Dr. Deatra Ina : Internal hemorrhoids/Diverticula, scattered in the ascending colon/  Moderate diverticulosis ascending colon to sigmoid colon  . Esophagogastroduodenoscopy   04/23/2003    Dr. Deatra Ina: HIATAL HERNIA  . Colonoscopy  01/16/2001    Normal  . Cataract extraction, bilateral    . Esophagogastroduodenoscopy  03/20/2012    [id number] ring-LIKELY CAUSING MILD DYSPHAGIA/SMALL hiatal hernia/Multiple sessile polyps ranging between 3-35mm , path benign  . Cataract extraction    . Carpal  tunnel release      Left wrist  . Transthoracic echocardiogram  06/24/2009    EF=>55%, mild assymetric LVH; LA mildly dilated; mild mitral annular calcif, borderline MVP, mild-mod MR; mild-mod TR, RV systolic pressure elevated, mild pulm HTN; AV mildly sclerotic; mild pulm valve regurg - ordered r/t bradycardia   . Endovenous ablation saphenous vein w/ laser  01/2011    Right GSV  . Cholecystectomy    . Intramedullary (im) nail intertrochanteric Left 03/25/2014    Procedure: INTRAMEDULLARY NAIL INTERTROCHANTRIC LEFT HIP;  Surgeon: Marianna Payment, MD;  Location: [place];  Service: Orthopedics;  Laterality: Left;   Family  History  Problem Relation Age of Onset  . Diabetes Mother   . Heart disease Mother   . Stroke Mother   . Cancer Sister     KIDNEY  . Emphysema Sister   . Heart disease Brother   . Heart disease Sister   . Emphysema Sister   . Cancer Sister     BREAST  . Heart disease Brother   . Colon cancer Sister   . Alzheimer's disease Father   . Heart disease Sister   . Tremor Sister   . Tremor Brother   . Alcoholism Child   . Cancer Brother   . Pancreatic cancer Brother   . Diabetes Son    Social History  Substance Use Topics  . Smoking status: Never Smoker   . Smokeless tobacco: Never Used  . Alcohol Use: No   OB History    No data available     Review of Systems  Respiratory: Negative for cough and shortness of breath.   Cardiovascular: Positive for leg swelling.  Gastrointestinal: Positive for nausea. Negative for vomiting.  Genitourinary: Positive for urgency, flank pain and decreased urine volume.  All other systems reviewed and are negative.  Allergies  Codeine; Morphine and related; Actonel; Fosamax; Losartan; Nitrofurantoin; and Penicillins  Home Medications   Prior to Admission medications   Medication Sig Start Date End Date Taking? Authorizing Provider  acetaminophen (TYLENOL) 500 MG tablet Take 1,000 mg by mouth every 6 (six) hours as needed for mild pain or moderate pain.    Historical Provider, MD  ALPRAZolam Duanne Moron) 0.5 MG tablet Take 1.5 tablets (0.75 mg total) by mouth at bedtime as needed for anxiety. 10/06/15   Kathrynn Ducking, MD  amLODipine (NORVASC) 10 MG tablet Take 1 tablet (10 mg total) by mouth daily. 10/28/15   Fayrene Helper, MD  apixaban (ELIQUIS) 5 MG TABS tablet Take 1 tablet (5 mg total) by mouth 2 (two) times daily. 10/07/15   Kathrynn Ducking, MD  atorvastatin (LIPITOR) 20 MG tablet TAKE ONE TABLET BY MOUTH ONCE DAILY 11/03/15   Fayrene Helper, MD  busPIRone (BUSPAR) 5 MG tablet i tablet three times daily 10/28/15   Fayrene Helper, MD   cetirizine (ZYRTEC) 10 MG tablet Take 10 mg by mouth daily.    Historical Provider, MD  donepezil (ARICEPT) 10 MG tablet TAKE ONE TABLET BY MOUTH ONCE DAILY AT BEDTIME Patient taking differently: TAKE ONE TABLET BY MOUTH ONCE DAILY AT AM 09/01/15   Fayrene Helper, MD  doxylamine, Sleep, (UNISOM) 25 MG tablet Take 25 mg by mouth at bedtime as needed.    Historical Provider, MD  esomeprazole (NEXIUM) 20 MG packet Take 20 mg by mouth daily before breakfast. 03/31/15   Fayrene Helper, MD  gabapentin (NEURONTIN) 100 MG capsule TAKE ONE CAPSULE BY MOUTH IN THE MORNING  AND TWO CAPS AT [place], THEN THREE CAPS  IN THE EVENING 10/31/15   Kathrynn Ducking, MD  HYDROcodone-acetaminophen (NORCO/VICODIN) 5-325 MG tablet Take 1 tablet by mouth every 6 (six) hours as needed for moderate pain. 10/30/15   Kathrynn Ducking, MD  levETIRAcetam (KEPPRA) 750 MG tablet TAKE ONE TABLET BY MOUTH TWICE DAILY 09/02/15   Kathrynn Ducking, MD  lidocaine (XYLOCAINE) 5 % ointment Apply 1 application topically daily as needed. Apply to left foot, low back as needed 08/05/15   Kathrynn Ducking, MD  metoprolol (LOPRESSOR) 50 MG tablet TAKE ONE-HALF TABLET BY MOUTH THREE TIMES DAILY 11/13/15   Fayrene Helper, MD  mirtazapine (REMERON) 30 MG tablet Take 1 tablet (30 mg total) by mouth at bedtime. 10/08/15   Kathrynn Ducking, MD  montelukast (SINGULAIR) 10 MG tablet TAKE ONE TABLET BY MOUTH AT BEDTIME 10/31/15   Fayrene Helper, MD  polyethylene glycol [facility] / Floria Raveling) packet Take 17 g by mouth daily. Patient taking differently: Take 17 g by mouth daily as needed for mild constipation.  05/05/15   Carole Civil, MD  Vitamin D, Ergocalciferol, (DRISDOL) 50000 units CAPS capsule TAKE 1 CAPSULE BY MOUTH EVERY 7 DAYS. 08/12/15   Fayrene Helper, MD   BP 170/78 mmHg  Pulse 82  Temp(Src) 97.6 F (36.4 C) (Oral)  Resp 18  SpO2 96% Physical Exam  Constitutional: She is oriented to person, place, and time. She appears  well-developed and well-nourished.  HENT:  Head: Normocephalic and atraumatic.  Right Ear: External ear normal.  Left Ear: External ear normal.  Nose: Nose normal.  Eyes: Right eye exhibits no discharge. Left eye exhibits no discharge.  Cardiovascular: Normal rate, regular rhythm and normal heart sounds.   Pulmonary/Chest: Effort normal and breath sounds normal.  Abdominal: Soft. There is no tenderness. There is CVA tenderness.  Right CVA tenderness.  Neurological: She is alert and oriented to person, place, and time.  Skin: Skin is warm and dry.  Nursing note and vitals reviewed.   ED Course  Procedures   DIAGNOSTIC STUDIES: Oxygen Saturation is 96% on RA, adequate by my interpretation.    COORDINATION OF CARE: 9:47 PM Will order labs, fantanyl, renal stone study CT. Discussed treatment plan with pt at bedside and pt agreed to plan.  Labs Review Labs Reviewed  COMPREHENSIVE METABOLIC PANEL - Abnormal; Notable for the following:    Glucose, Bld 106 (*)    Creatinine, Ser 0.40 (*)    Calcium 8.7 (*)    ALT 12 (*)    All other components within normal limits  CBC WITH DIFFERENTIAL/PLATELET - Abnormal; Notable for the following:    MCV 100.7 (*)    All other components within normal limits  URINALYSIS, ROUTINE W REFLEX MICROSCOPIC (NOT AT [facility]) - Abnormal; Notable for the following:    Hgb urine dipstick TRACE (*)    Leukocytes, UA TRACE (*)    All other components within normal limits  URINE MICROSCOPIC-ADD ON - Abnormal; Notable for the following:    Squamous Epithelial / LPF 0-5 (*)    Bacteria, UA RARE (*)    All other components within normal limits  URINE CULTURE  I-STAT CG4 LACTIC ACID, ED    Imaging Review No results found. I have personally reviewed and evaluated these images and lab results as part of my medical decision-making.   EKG Interpretation None      MDM   Final diagnoses:  None  Patient with urinary symptoms and right flank pain. She  denies cough or dyspnea. Likely renal in etiology. Much more comfortable after small amount of IV fentanyl. Will get CT stone study to r/o obstructing stone. Care to Dr. Tomi Bamberger with CT pending.   I personally performed the services described in this documentation, which was scribed in my presence. The recorded information has been reviewed and is accurate.    Sherwood Gambler, MD 11/22/15 0110

## 2015-11-22 ENCOUNTER — Emergency Department (HOSPITAL_COMMUNITY): Payer: Medicare Other

## 2015-11-22 ENCOUNTER — Encounter (HOSPITAL_COMMUNITY): Payer: Self-pay | Admitting: Internal Medicine

## 2015-11-22 ENCOUNTER — Telehealth: Payer: Self-pay | Admitting: Family Medicine

## 2015-11-22 DIAGNOSIS — R269 Unspecified abnormalities of gait and mobility: Secondary | ICD-10-CM | POA: Diagnosis not present

## 2015-11-22 DIAGNOSIS — I10 Essential (primary) hypertension: Secondary | ICD-10-CM

## 2015-11-22 DIAGNOSIS — I482 Chronic atrial fibrillation: Secondary | ICD-10-CM | POA: Diagnosis not present

## 2015-11-22 DIAGNOSIS — G8929 Other chronic pain: Secondary | ICD-10-CM | POA: Diagnosis present

## 2015-11-22 DIAGNOSIS — J189 Pneumonia, unspecified organism: Secondary | ICD-10-CM | POA: Diagnosis not present

## 2015-11-22 DIAGNOSIS — G4730 Sleep apnea, unspecified: Secondary | ICD-10-CM | POA: Diagnosis present

## 2015-11-22 DIAGNOSIS — M4806 Spinal stenosis, lumbar region: Secondary | ICD-10-CM | POA: Diagnosis present

## 2015-11-22 DIAGNOSIS — K219 Gastro-esophageal reflux disease without esophagitis: Secondary | ICD-10-CM | POA: Diagnosis present

## 2015-11-22 DIAGNOSIS — Z66 Do not resuscitate: Secondary | ICD-10-CM | POA: Diagnosis present

## 2015-11-22 DIAGNOSIS — I69354 Hemiplegia and hemiparesis following cerebral infarction affecting left non-dominant side: Secondary | ICD-10-CM | POA: Diagnosis not present

## 2015-11-22 DIAGNOSIS — M810 Age-related osteoporosis without current pathological fracture: Secondary | ICD-10-CM | POA: Diagnosis present

## 2015-11-22 DIAGNOSIS — E785 Hyperlipidemia, unspecified: Secondary | ICD-10-CM | POA: Diagnosis present

## 2015-11-22 LAB — CBC WITH DIFFERENTIAL/PLATELET
Basophils Absolute: 0 10*3/uL (ref 0.0–0.1)
Basophils Relative: 0 %
EOS PCT: 0 %
Eosinophils Absolute: 0 10*3/uL (ref 0.0–0.7)
HEMATOCRIT: 43.5 % (ref 36.0–46.0)
Hemoglobin: 13.9 g/dL (ref 12.0–15.0)
LYMPHS PCT: 13 %
Lymphs Abs: 1.3 10*3/uL (ref 0.7–4.0)
MCH: 31.8 pg (ref 26.0–34.0)
MCHC: 32 g/dL (ref 30.0–36.0)
MCV: 99.5 fL (ref 78.0–100.0)
MONO ABS: 0.5 10*3/uL (ref 0.1–1.0)
MONOS PCT: 5 %
NEUTROS ABS: 8.2 10*3/uL — AB (ref 1.7–7.7)
Neutrophils Relative %: 82 %
PLATELETS: 220 10*3/uL (ref 150–400)
RBC: 4.37 MIL/uL (ref 3.87–5.11)
RDW: 13.2 % (ref 11.5–15.5)
WBC: 10.1 10*3/uL (ref 4.0–10.5)

## 2015-11-22 LAB — STREP PNEUMONIAE URINARY ANTIGEN: Strep Pneumo Urinary Antigen: NEGATIVE

## 2015-11-22 LAB — COMPREHENSIVE METABOLIC PANEL
ALBUMIN: 3.9 g/dL (ref 3.5–5.0)
ALK PHOS: 87 U/L (ref 38–126)
ALT: 13 U/L — ABNORMAL LOW (ref 14–54)
ANION GAP: 7 (ref 5–15)
AST: 23 U/L (ref 15–41)
BILIRUBIN TOTAL: 0.6 mg/dL (ref 0.3–1.2)
BUN: 7 mg/dL (ref 6–20)
CALCIUM: 9.3 mg/dL (ref 8.9–10.3)
CO2: 30 mmol/L (ref 22–32)
Chloride: 104 mmol/L (ref 101–111)
Creatinine, Ser: 0.42 mg/dL — ABNORMAL LOW (ref 0.44–1.00)
GFR calc Af Amer: >60 mL/min (ref >60)
GFR calc non Af Amer: >60 mL/min (ref >60)
GLUCOSE: 119 mg/dL — AB (ref 65–99)
POTASSIUM: 3.3 mmol/L — AB (ref 3.5–5.1)
SODIUM: 141 mmol/L (ref 135–145)
TOTAL PROTEIN: 7.1 g/dL (ref 6.5–8.1)

## 2015-11-22 LAB — MRSA PCR SCREENING: MRSA by PCR: POSITIVE — AB

## 2015-11-22 MED ORDER — LEVETIRACETAM 500 MG PO TABS
750.0000 mg | ORAL_TABLET | Freq: Two times a day (BID) | ORAL | Status: DC
  Administered 2015-11-22 – 2015-11-23 (×3): 750 mg via ORAL
  Filled 2015-11-22 (×3): qty 1

## 2015-11-22 MED ORDER — CHLORHEXIDINE GLUCONATE CLOTH 2 % EX PADS: 6.0000 | MEDICATED_PAD | Freq: Every day | CUTANEOUS | Status: DC

## 2015-11-22 MED ORDER — FENTANYL CITRATE (PF) 100 MCG/2ML IJ SOLN
25.0000 ug | Freq: Once | INTRAMUSCULAR | Status: AC
  Administered 2015-11-22: 25 ug via INTRAVENOUS
  Filled 2015-11-22: qty 2

## 2015-11-22 MED ORDER — ACETAMINOPHEN 325 MG PO TABS
650.0000 mg | ORAL_TABLET | Freq: Four times a day (QID) | ORAL | Status: DC | PRN
  Administered 2015-11-23: 650 mg via ORAL
  Filled 2015-11-22: qty 2

## 2015-11-22 MED ORDER — ALPRAZOLAM 0.25 MG PO TABS: 0.7500 mg | ORAL_TABLET | Freq: Every evening | ORAL | Status: DC | PRN

## 2015-11-22 MED ORDER — ESOMEPRAZOLE MAGNESIUM 20 MG PO CPDR
20.0000 mg | DELAYED_RELEASE_CAPSULE | Freq: Every day | ORAL | Status: DC
  Filled 2015-11-22 (×3): qty 1

## 2015-11-22 MED ORDER — GABAPENTIN 100 MG PO CAPS
100.0000 mg | ORAL_CAPSULE | Freq: Every day | ORAL | Status: DC
  Administered 2015-11-22 – 2015-11-23 (×2): 100 mg via ORAL
  Filled 2015-11-22 (×2): qty 1

## 2015-11-22 MED ORDER — ACETAMINOPHEN 650 MG RE SUPP: 650.0000 mg | Freq: Four times a day (QID) | RECTAL | Status: DC | PRN

## 2015-11-22 MED ORDER — AMLODIPINE BESYLATE 5 MG PO TABS
10.0000 mg | ORAL_TABLET | Freq: Every day | ORAL | Status: DC
  Administered 2015-11-22 – 2015-11-23 (×2): 10 mg via ORAL
  Filled 2015-11-22 (×2): qty 2

## 2015-11-22 MED ORDER — MIRTAZAPINE 30 MG PO TABS
30.0000 mg | ORAL_TABLET | Freq: Every day | ORAL | Status: DC
  Administered 2015-11-22: 30 mg via ORAL
  Filled 2015-11-22: qty 1

## 2015-11-22 MED ORDER — DEXTROSE 5 % IV SOLN
500.0000 mg | INTRAVENOUS | Status: DC
  Administered 2015-11-23: 500 mg via INTRAVENOUS
  Filled 2015-11-22 (×2): qty 500

## 2015-11-22 MED ORDER — PANTOPRAZOLE SODIUM 40 MG PO TBEC
40.0000 mg | DELAYED_RELEASE_TABLET | Freq: Every day | ORAL | Status: DC
  Administered 2015-11-22 – 2015-11-23 (×2): 40 mg via ORAL
  Filled 2015-11-22 (×2): qty 1

## 2015-11-22 MED ORDER — GABAPENTIN 100 MG PO CAPS
200.0000 mg | ORAL_CAPSULE | Freq: Every day | ORAL | Status: DC
  Administered 2015-11-22 – 2015-11-23 (×2): 200 mg via ORAL
  Filled 2015-11-22 (×2): qty 2

## 2015-11-22 MED ORDER — DEXTROSE 5 % IV SOLN
1.0000 g | Freq: Once | INTRAVENOUS | Status: AC
  Administered 2015-11-22: 1 g via INTRAVENOUS
  Filled 2015-11-22: qty 10

## 2015-11-22 MED ORDER — GABAPENTIN 300 MG PO CAPS
300.0000 mg | ORAL_CAPSULE | Freq: Every day | ORAL | Status: DC
  Administered 2015-11-22: 300 mg via ORAL
  Filled 2015-11-22: qty 1

## 2015-11-22 MED ORDER — DONEPEZIL HCL 5 MG PO TABS
10.0000 mg | ORAL_TABLET | Freq: Every day | ORAL | Status: DC
Start: 2015-11-22 — End: 2015-11-23
  Administered 2015-11-22 – 2015-11-23 (×2): 10 mg via ORAL
  Filled 2015-11-22 (×2): qty 2

## 2015-11-22 MED ORDER — ATORVASTATIN CALCIUM 20 MG PO TABS
20.0000 mg | ORAL_TABLET | Freq: Every day | ORAL | Status: DC
  Administered 2015-11-22 – 2015-11-23 (×2): 20 mg via ORAL
  Filled 2015-11-22 (×2): qty 1

## 2015-11-22 MED ORDER — ONDANSETRON HCL 4 MG/2ML IJ SOLN: 4.0000 mg | Freq: Four times a day (QID) | INTRAMUSCULAR | Status: DC | PRN

## 2015-11-22 MED ORDER — HYDROCODONE-ACETAMINOPHEN 5-325 MG PO TABS
0.5000 | ORAL_TABLET | Freq: Four times a day (QID) | ORAL | Status: DC | PRN
  Administered 2015-11-23 (×2): 1 via ORAL
  Filled 2015-11-22 (×2): qty 1

## 2015-11-22 MED ORDER — DEXTROSE 5 % IV SOLN
1.0000 g | INTRAVENOUS | Status: DC
  Administered 2015-11-23: 1 g via INTRAVENOUS
  Filled 2015-11-22 (×2): qty 10

## 2015-11-22 MED ORDER — BUSPIRONE HCL 5 MG PO TABS
5.0000 mg | ORAL_TABLET | Freq: Three times a day (TID) | ORAL | Status: DC
  Administered 2015-11-22 – 2015-11-23 (×5): 5 mg via ORAL
  Filled 2015-11-22 (×5): qty 1

## 2015-11-22 MED ORDER — APIXABAN 5 MG PO TABS
5.0000 mg | ORAL_TABLET | Freq: Two times a day (BID) | ORAL | Status: DC
  Administered 2015-11-22 – 2015-11-23 (×3): 5 mg via ORAL
  Filled 2015-11-22 (×3): qty 1

## 2015-11-22 MED ORDER — VITAMIN D (ERGOCALCIFEROL) 1.25 MG (50000 UNIT) PO CAPS: 50000.0000 [IU] | ORAL_CAPSULE | ORAL | Status: DC

## 2015-11-22 MED ORDER — DEXTROSE 5 % IV SOLN
500.0000 mg | Freq: Once | INTRAVENOUS | Status: AC
  Administered 2015-11-22: 500 mg via INTRAVENOUS
  Filled 2015-11-22: qty 500

## 2015-11-22 MED ORDER — MONTELUKAST SODIUM 10 MG PO TABS
10.0000 mg | ORAL_TABLET | Freq: Every day | ORAL | Status: DC
  Administered 2015-11-22: 10 mg via ORAL
  Filled 2015-11-22: qty 1

## 2015-11-22 MED ORDER — METOPROLOL TARTRATE 25 MG PO TABS
25.0000 mg | ORAL_TABLET | Freq: Three times a day (TID) | ORAL | Status: DC
  Administered 2015-11-22 – 2015-11-23 (×5): 25 mg via ORAL
  Filled 2015-11-22 (×5): qty 1

## 2015-11-22 MED ORDER — DOXYLAMINE SUCCINATE (SLEEP) 25 MG PO TABS
25.0000 mg | ORAL_TABLET | Freq: Every day | ORAL | Status: DC
  Filled 2015-11-22 (×2): qty 1

## 2015-11-22 MED ORDER — MUPIROCIN 2 % EX OINT
1.0000 "application " | TOPICAL_OINTMENT | Freq: Two times a day (BID) | CUTANEOUS | Status: DC
  Administered 2015-11-22 – 2015-11-23 (×3): 1 via NASAL
  Filled 2015-11-22 (×2): qty 22

## 2015-11-22 MED ORDER — POLYETHYLENE GLYCOL 3350 17 G PO PACK: 17.0000 g | PACK | Freq: Every day | ORAL | Status: DC | PRN

## 2015-11-22 MED ORDER — ONDANSETRON HCL 4 MG PO TABS: 4.0000 mg | ORAL_TABLET | Freq: Four times a day (QID) | ORAL | Status: DC | PRN

## 2015-11-22 NOTE — ED Provider Notes (Signed)
Patient left at change of shift to get results of a renal CT scan. After reviewing the CT scan I went to talk to the patient and her family. They reports she's had a hacking cough "for a long time", possibly a few months. She does not have fever however they states she never gets fever. Family states she's had some shortness of breath although patient denies it. Chest x-ray was ordered to clarify the possibility of pneumonia seen on her right lung on the CT scan of her abdomen. Family is really stressing that they want her to be admitted.  Chest x-ray does verify pneumonia. Patient was started on community-acquired pneumonia antibiotics. Patient has a penicillin allergy however she's had Ancef and Rocephin in the past as recently as in October.  2:43 AM Dr. Hal Hope, hospitalist, admit to med-surg, observation   Dg Chest 2 View  11/22/2015  CLINICAL DATA:  82 year old female with abnormal CT scan. EXAM: CHEST  2 VIEW COMPARISON:  Chest radiograph dated 05/29/2015 FINDINGS: Two views of the chest demonstrate airspace opacity in the right lower lobe. There is a small right pleural effusion. Left lung base atelectatic changes noted. Right upper lobe surgical suture. There is no pneumothorax. There is moderate cardiomegaly. Osteopenia with degenerative changes of the spine. No acute fracture. IMPRESSION: Right lower lobe opacity and small right pleural effusion. Clinical correlation and follow-up recommended. No pneumothorax. Electronically Signed   By: Anner Crete M.D.   On: 11/22/2015 02:14   Ct Renal Stone Study  11/22/2015  CLINICAL DATA:  82 year old female with right flank pain and difficulty urinating EXAM: CT ABDOMEN AND PELVIS WITHOUT CONTRAST TECHNIQUE: Multidetector CT imaging of the abdomen and pelvis was performed following the standard protocol without IV contrast. COMPARISON:  CT dated 06/04/2015 FINDINGS: Evaluation of this exam is limited in the absence of intravenous contrast.  Partially visualized small bilateral pleural effusions. There is consolidative changes of the right lung base which may represent compressive atelectasis versus pneumonia. This appearance is somewhat similar to prior study. The left pleural effusion appears slightly increased compared to the prior study. No intra-abdominal free air or free fluid. Cholecystectomy. The liver, pancreas, spleen, and adrenal glands appear unremarkable. Stable appearing 4 cm left renal hypodense lesion, incompletely characterized on this noncontrast study, most likely a cyst. Multiple nonobstructing bilateral renal calculi measuring up to 5 mm similar to prior study. There is no hydronephrosis on either side. Go the visualized ureters appear unremarkable. There is apparent diffuse thickening of the bladder wall which may be partly related to underdistention. Cystitis is not excluded. Correlation with urinalysis recommended. Hysterectomy. There is moderate stool throughout the colon. There are scattered colonic diverticula without active inflammatory changes. There is no evidence of bowel obstruction or active inflammation. There is aortoiliac atherosclerotic disease. A 1 cm calcified splenic artery aneurysm. Evaluation of the vasculature is limited in the absence of intravenous contrast. No portal venous gas identified. There is mild diffuse haziness of the mesentery, nonspecific. No adenopathy. The abdominal wall soft tissues appear unremarkable. There is osteopenia with degenerative changes of the spine. Left femoral intra medullary rod and transcervical fixation screw. No acute fracture. Bilateral breast implants. IMPRESSION: Multiple small nonobstructing bilateral renal calculi similar to prior study. No hydronephrosis. Partially visualized small bilateral pleural effusions with patchy area of consolidative changes at the lung bases, right greater left. Clinical correlation is recommended to evaluate for pneumonia. Colonic  diverticulosis. No evidence of active inflammation or bowel obstruction. Electronically Signed   By:  Anner Crete M.D.   On: 11/22/2015 00:59    Diagnoses that have been ruled out:  None  Diagnoses that are still under consideration:  None  Final diagnoses:  Right flank pain  CAP (community acquired pneumonia)    Plan admission  Rolland Porter, MD, Barbette Or, MD 11/22/15 [phone]

## 2015-11-22 NOTE — Progress Notes (Signed)
Patient concerned about her breathing, asking is she is more SOB and breathing ok.  I advised her that her respiratory pattern is normal and unlabored with mild congestion noted and decreased lung sounds. Patient stated that made her feel better.

## 2015-11-22 NOTE — H&P (Signed)
History and Physical    Erin [name] [id number] DOB: [birth year] DOA: 11/21/2015  PCP: Tula Nakayama, MD  Patient coming from: Home.  Chief Complaint: Right mid back pain.  HPI: Erin [name] is a 82 y.o. female with medical history significant of chronic atrial fibrillation, hypertension, focal seizures, neuropathy and gait disturbance with previous history of stroke presents to the year because of increasing pain on the right mid back around the posterior aspect of the chest and flank area. Patient has been having these symptoms for last 2 days. Pain increases on deep inspiration and patient has been having some productive cough. UA was unremarkable. CT renal study was done shows multiple renal stones nonobstructive and also showed features concerning for pneumonia. Chest x-ray confirms pneumonic process. Patient is being admitted for pneumonia.   ED Course: Started her on ceftriaxone and Zithromax.  Review of Systems: As per HPI otherwise 10 point review of systems negative.    Past Medical History  Diagnosis Date  . Anxiety   . Arthritis   . Hyperlipidemia   . Essential hypertension, benign   . Osteoporosis   . GERD (gastroesophageal reflux disease)   . Hearing loss   . Restless leg   . Tremor   . Leg edema   . Obese   . Gall bladder disease   . Cataract   . Carpal tunnel syndrome of left wrist   . Atrial fibrillation ([place])   . Sleep apnea   . Esophageal dilatation   . Frequent falls   . History of nuclear stress test 08/31/2012    Lexiscan cardiolite negative for ischemia  . Polyneuropathy in other diseases classified elsewhere ([place]) 10/03/2013  . Cardioembolic stroke ([place]) [id number]  . UTI (lower urinary tract infection)   . History of stroke with current residual effects 11/20/2014  . Seizures ([place])   . Focal motor seizure disorder ([place]) 12/24/2014    Left leg involvment  . Sequela, post-stroke 04/04/2015     Hemiparesis, nondominant hemisphere  . Focal  seizures ([place]) 10/08/2015    Past Surgical History  Procedure Laterality Date  . Appendectomy    . Cholecystectomy    . Abdominal hysterectomy    . Fracture surgery      Left arm x4  . Benign cyst removed from kidney and lung, bilateral mastectomy    . Bilateral great toenail removal    . Knee arthroscopy  2012    Right knee, Dr Aline Brochure  . Left forearm    . Breast surgery      Bilateral 2000  . Tonsillectomy    . R hand surgery    . Bronchoscopy  02/15/2000  . Right vats.  "  . Right upper lobe wedge resection.  "  . Upper gastrointestinal endoscopy  11/25/1999    Esophagitis/ Normal proximal esophagus, stomach and duodenum  . Colonoscopy  01/17/2009    Dr. Deatra Ina : Internal hemorrhoids/Diverticula, scattered in the ascending colon/  Moderate diverticulosis ascending colon to sigmoid colon  . Esophagogastroduodenoscopy   04/23/2003    Dr. Deatra Ina: HIATAL HERNIA  . Colonoscopy  01/16/2001    Normal  . Cataract extraction, bilateral    . Esophagogastroduodenoscopy  03/20/2012    [id number] ring-LIKELY CAUSING MILD DYSPHAGIA/SMALL hiatal hernia/Multiple sessile polyps ranging between 3-73mm , path benign  . Cataract extraction    . Carpal tunnel release      Left wrist  . Transthoracic echocardiogram  06/24/2009    EF=>55%, mild assymetric LVH; LA  mildly dilated; mild mitral annular calcif, borderline MVP, mild-mod MR; mild-mod TR, RV systolic pressure elevated, mild pulm HTN; AV mildly sclerotic; mild pulm valve regurg - ordered r/t bradycardia   . Endovenous ablation saphenous vein w/ laser  01/2011    Right GSV  . Cholecystectomy    . Intramedullary (im) nail intertrochanteric Left 03/25/2014    Procedure: INTRAMEDULLARY NAIL INTERTROCHANTRIC LEFT HIP;  Surgeon: Marianna Payment, MD;  Location: [facility];  Service: Orthopedics;  Laterality: Left;     reports that she has never smoked. She has never used smokeless tobacco. She reports that she does not drink alcohol or use  illicit drugs.  Allergies  Allergen Reactions  . Codeine Hives  . Morphine And Related Itching and Other (See Comments)    Makes pt hallucinate  . Actonel [Risedronate Sodium] Other (See Comments)    Abdominal pain  . Fosamax [Alendronate Sodium] Other (See Comments)    Abdominal pian  . Losartan Other (See Comments)    Hospitalized in 06/2013 with pancreatitis, losartan is associated with increased risk of pancreatitis ,  Hence discontinued  . Nitrofurantoin Other (See Comments)    Hospitalized with pancreatitis in 06/2013. Nitrofurantoin implicated as a possible cause  . Penicillins     Family History  Problem Relation Age of Onset  . Diabetes Mother   . Heart disease Mother   . Stroke Mother   . Cancer Sister     KIDNEY  . Emphysema Sister   . Heart disease Brother   . Heart disease Sister   . Emphysema Sister   . Cancer Sister     BREAST  . Heart disease Brother   . Colon cancer Sister   . Alzheimer's disease Father   . Heart disease Sister   . Tremor Sister   . Tremor Brother   . Alcoholism Child   . Cancer Brother   . Pancreatic cancer Brother   . Diabetes Son     Prior to Admission medications   Medication Sig Start Date End Date Taking? Authorizing Provider  acetaminophen (TYLENOL) 500 MG tablet Take 1,000 mg by mouth every 6 (six) hours as needed for mild pain or moderate pain.   Yes Historical Provider, MD  ALPRAZolam Duanne Moron) 0.5 MG tablet Take 1.5 tablets (0.75 mg total) by mouth at bedtime as needed for anxiety. 10/06/15  Yes Kathrynn Ducking, MD  amLODipine (NORVASC) 10 MG tablet Take 1 tablet (10 mg total) by mouth daily. 10/28/15  Yes Fayrene Helper, MD  apixaban (ELIQUIS) 5 MG TABS tablet Take 1 tablet (5 mg total) by mouth 2 (two) times daily. 10/07/15  Yes Kathrynn Ducking, MD  atorvastatin (LIPITOR) 20 MG tablet TAKE ONE TABLET BY MOUTH ONCE DAILY 11/03/15  Yes Fayrene Helper, MD  busPIRone (BUSPAR) 5 MG tablet i tablet three times  daily Patient taking differently: Take 5 mg by mouth 3 (three) times daily. i tablet three times daily 10/28/15  Yes Fayrene Helper, MD  donepezil (ARICEPT) 10 MG tablet TAKE ONE TABLET BY MOUTH ONCE DAILY AT BEDTIME Patient taking differently: TAKE ONE TABLET BY MOUTH ONCE DAILY IN THE MORNING 09/01/15  Yes Fayrene Helper, MD  doxylamine, Sleep, (UNISOM) 25 MG tablet Take 25 mg by mouth at bedtime.    Yes Historical Provider, MD  esomeprazole (NEXIUM) 20 MG packet Take 20 mg by mouth daily before breakfast. 03/31/15  Yes Fayrene Helper, MD  gabapentin (NEURONTIN) 100 MG capsule TAKE ONE CAPSULE  BY MOUTH IN THE MORNING AND TWO CAPS AT [place], [place] CAPS  IN THE EVENING Patient taking differently: TAKE ONE CAPSULE BY MOUTH IN THE MORNING AND ONE CAP AT [place], THEN THREE CAPS  IN THE EVENING 10/31/15  Yes Kathrynn Ducking, MD  HYDROcodone-acetaminophen (NORCO/VICODIN) 5-325 MG tablet Take 1 tablet by mouth every 6 (six) hours as needed for moderate pain. Patient taking differently: Take 0.5-1 tablets by mouth every 6 (six) hours as needed for moderate pain.  10/30/15  Yes Kathrynn Ducking, MD  levETIRAcetam (KEPPRA) 750 MG tablet TAKE ONE TABLET BY MOUTH TWICE DAILY 09/02/15  Yes Kathrynn Ducking, MD  metoprolol (LOPRESSOR) 50 MG tablet TAKE ONE-HALF TABLET BY MOUTH THREE TIMES DAILY 11/13/15  Yes Fayrene Helper, MD  mirtazapine (REMERON) 30 MG tablet Take 1 tablet (30 mg total) by mouth at bedtime. 10/08/15  Yes Kathrynn Ducking, MD  montelukast (SINGULAIR) 10 MG tablet TAKE ONE TABLET BY MOUTH AT BEDTIME 10/31/15  Yes Fayrene Helper, MD  Vitamin D, Ergocalciferol, (DRISDOL) 50000 units CAPS capsule TAKE 1 CAPSULE BY MOUTH EVERY 7 DAYS. 08/12/15  Yes Fayrene Helper, MD  polyethylene glycol [facility] / GLYCOLAX) packet Take 17 g by mouth daily. Patient taking differently: Take 17 g by mouth daily as needed for mild constipation.  05/05/15   Carole Civil, MD    Physical  Exam: Filed Vitals:   11/22/15 0215 11/22/15 0230 11/22/15 0330 11/22/15 0505  BP:  124/73 130/95 156/66  Pulse: 88 89 95 71  Temp:    98 F (36.7 C)  TempSrc:    Oral  Resp: 23 25  22   Height:    5\' 7"  (1.702 m)  Weight:    134 lb 14.7 oz (61.2 kg)  SpO2: 91% 91% 90% 86%      Constitutional: Not in distress. Filed Vitals:   11/22/15 0215 11/22/15 0230 11/22/15 0330 11/22/15 0505  BP:  124/73 130/95 156/66  Pulse: 88 89 95 71  Temp:    98 F (36.7 C)  TempSrc:    Oral  Resp: 23 25  22   Height:    5\' 7"  (1.702 m)  Weight:    134 lb 14.7 oz (61.2 kg)  SpO2: 91% 91% 90% 86%   Eyes: Anicteric no pallor. ENMT: No discharge from the ears eyes nose or mouth. Neck: No mass felt. No JVD appreciated. Respiratory: No rhonchi or crepitations. Cardiovascular: S1-S2 heard. Abdomen: Soft nontender bowel sounds present. Musculoskeletal: No edema. Skin: No rash. Neurologic: Alert awake oriented to time place and person. Moves all extremities. (Has history of left hemiparesis from previous stroke). Psychiatric: Appears normal.   Labs on Admission: I have personally reviewed following labs and imaging studies  CBC:  Recent Labs Lab 11/21/15 2205  WBC 10.2  NEUTROABS 6.5  HGB 13.4  HCT 41.6  MCV 100.7*  PLT 123456   Basic Metabolic Panel:  Recent Labs Lab 11/21/15 2205  NA 139  K 3.5  CL 104  CO2 28  GLUCOSE 106*  BUN 11  CREATININE 0.40*  CALCIUM 8.7*   GFR: Estimated Creatinine Clearance: 50.6 mL/min (by C-G formula based on Cr of 0.4). Liver Function Tests:  Recent Labs Lab 11/21/15 2205  AST 22  ALT 12*  ALKPHOS 77  BILITOT 0.4  PROT 7.0  ALBUMIN 3.8   No results for input(s): LIPASE, AMYLASE in the last 168 hours. No results for input(s): AMMONIA in the last 168 hours. Coagulation  Profile: No results for input(s): INR, PROTIME in the last 168 hours. Cardiac Enzymes: No results for input(s): CKTOTAL, CKMB, CKMBINDEX, TROPONINI in the last 168  hours. BNP (last 3 results) No results for input(s): PROBNP in the last 8760 hours. HbA1C: No results for input(s): HGBA1C in the last 72 hours. CBG: No results for input(s): GLUCAP in the last 168 hours. Lipid Profile: No results for input(s): CHOL, HDL, LDLCALC, TRIG, CHOLHDL, LDLDIRECT in the last 72 hours. Thyroid Function Tests: No results for input(s): TSH, T4TOTAL, FREET4, T3FREE, THYROIDAB in the last 72 hours. Anemia Panel: No results for input(s): VITAMINB12, FOLATE, FERRITIN, TIBC, IRON, RETICCTPCT in the last 72 hours. Urine analysis:    Component Value Date/Time   COLORURINE YELLOW 11/21/2015 2257   APPEARANCEUR CLEAR 11/21/2015 2257   LABSPEC 1.010 11/21/2015 2257   PHURINE 7.0 11/21/2015 2257   GLUCOSEU NEGATIVE 11/21/2015 2257   HGBUR TRACE* 11/21/2015 2257   BILIRUBINUR NEGATIVE 11/21/2015 2257   BILIRUBINUR neg 03/31/2015 1450   KETONESUR NEGATIVE 11/21/2015 2257   PROTEINUR NEGATIVE 11/21/2015 2257   PROTEINUR 30 03/31/2015 1450   UROBILINOGEN 0.2 05/26/2015 1400   UROBILINOGEN 1.0 03/31/2015 1450   NITRITE NEGATIVE 11/21/2015 2257   NITRITE positive 03/31/2015 1450   LEUKOCYTESUR TRACE* 11/21/2015 2257   Sepsis Labs: @LABRCNTIP (procalcitonin:4,lacticidven:4) )No results found for this or any previous visit (from the past 240 hour(s)).   Radiological Exams on Admission: Dg Chest 2 View  11/22/2015  CLINICAL DATA:  82 year old female with abnormal CT scan. EXAM: CHEST  2 VIEW COMPARISON:  Chest radiograph dated 05/29/2015 FINDINGS: Two views of the chest demonstrate airspace opacity in the right lower lobe. There is a small right pleural effusion. Left lung base atelectatic changes noted. Right upper lobe surgical suture. There is no pneumothorax. There is moderate cardiomegaly. Osteopenia with degenerative changes of the spine. No acute fracture. IMPRESSION: Right lower lobe opacity and small right pleural effusion. Clinical correlation and follow-up  recommended. No pneumothorax. Electronically Signed   By: Anner Crete M.D.   On: 11/22/2015 02:14   Ct Renal Stone Study  11/22/2015  CLINICAL DATA:  82 year old female with right flank pain and difficulty urinating EXAM: CT ABDOMEN AND PELVIS WITHOUT CONTRAST TECHNIQUE: Multidetector CT imaging of the abdomen and pelvis was performed following the standard protocol without IV contrast. COMPARISON:  CT dated 06/04/2015 FINDINGS: Evaluation of this exam is limited in the absence of intravenous contrast. Partially visualized small bilateral pleural effusions. There is consolidative changes of the right lung base which may represent compressive atelectasis versus pneumonia. This appearance is somewhat similar to prior study. The left pleural effusion appears slightly increased compared to the prior study. No intra-abdominal free air or free fluid. Cholecystectomy. The liver, pancreas, spleen, and adrenal glands appear unremarkable. Stable appearing 4 cm left renal hypodense lesion, incompletely characterized on this noncontrast study, most likely a cyst. Multiple nonobstructing bilateral renal calculi measuring up to 5 mm similar to prior study. There is no hydronephrosis on either side. Go the visualized ureters appear unremarkable. There is apparent diffuse thickening of the bladder wall which may be partly related to underdistention. Cystitis is not excluded. Correlation with urinalysis recommended. Hysterectomy. There is moderate stool throughout the colon. There are scattered colonic diverticula without active inflammatory changes. There is no evidence of bowel obstruction or active inflammation. There is aortoiliac atherosclerotic disease. A 1 cm calcified splenic artery aneurysm. Evaluation of the vasculature is limited in the absence of intravenous contrast. No portal venous gas  identified. There is mild diffuse haziness of the mesentery, nonspecific. No adenopathy. The abdominal wall soft tissues  appear unremarkable. There is osteopenia with degenerative changes of the spine. Left femoral intra medullary rod and transcervical fixation screw. No acute fracture. Bilateral breast implants. IMPRESSION: Multiple small nonobstructing bilateral renal calculi similar to prior study. No hydronephrosis. Partially visualized small bilateral pleural effusions with patchy area of consolidative changes at the lung bases, right greater left. Clinical correlation is recommended to evaluate for pneumonia. Colonic diverticulosis. No evidence of active inflammation or bowel obstruction. Electronically Signed   By: Anner Crete M.D.   On: 11/22/2015 00:59     Assessment/Plan Principal Problem:   CAP (community acquired pneumonia) Active Problems:   Essential hypertension, benign   SPINAL STENOSIS, LUMBAR   UNSTEADY GAIT   Chronic atrial fibrillation ([place])   Sleep apnea   Focal seizures ([facility])   Pneumonia    #1. Community-acquired pneumonia - patient's pain in the right mid back is most likely from community acquired pneumonia. Given the patient's productive cough and chest x-ray findings at this time patient has been placed on ceftriaxone and Zithromax and we will closely observe. Check urine for Legionella and strep antigen. Continue with patient's home medications for pain. #2. Chronic atrial fibrillation closely rate controlled. Chads 2 vasc score is more than 2 and patient is on Apixaban which will be continued along with metoprolol 25 mg 3 times a day. #3. Focal seizures - on Keppra. #4. Chronic pain/spinal stenosis - continue Neurontin. #5. Hypertension - continue metoprolol. #6. History of essential tremors. #7. 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   DVT prophylaxis: Apixaban. Code Status: DO NOT RESUSCITATE.  Family Communication: No family at the bedside.  Disposition Plan: Home.  Consults called: Physical therapy.  Admission status: Observation. MedSurg floor.     Rise Patience MD [facility] Pager [phone].  If 7PM-7AM, please contact night-coverage [email or web address] Password [email or web address]  11/22/2015, 6:20 AM

## 2015-11-22 NOTE — Care Management Obs Status (Signed)
[place] NOTIFICATION   Patient Details  Name: Erin [name] MRN: [id number] Date of Birth: 12-07-1929 CM witnessed family sign for patient  Medicare Observation Status Notification Given:  Yes    Briant Sites, RN 11/22/2015, 3:59 PM

## 2015-11-22 NOTE — Telephone Encounter (Signed)
Called on 5/12 c/o flank pain and inability to pass urine x 2 days, ED evaluation recommended

## 2015-11-22 NOTE — Progress Notes (Signed)
[facility] PROGRESS NOTE  Erin [name] [id number] DOB: 1929/09/01    PCP:   Tula Nakayama, MD   HPI: Erin [name] is a 82 y.o. female with medical history significant of chronic atrial fibrillation, hypertension, focal seizures, neuropathy and gait disturbance with previous history of stroke presents to the year because of increasing pain on the right mid back around the posterior aspect of the chest and flank area. Patient has been having these symptoms for last 2 days. Pain increases on deep inspiration and patient has been having some productive cough. UA was unremarkable. CT renal study was done shows multiple renal stones nonobstructive and also showed features concerning for pneumonia. Chest x-ray confirms pneumonic process. Patient is being admitted for pneumonia.  She felt better since admission and wanted to go home.   Rewiew of Systems:  Constitutional: Negative for malaise, fever and chills. No significant weight loss or weight gain Eyes: Negative for eye pain, redness and discharge, diplopia, visual changes, or flashes of light. ENMT: Negative for ear pain, hoarseness, nasal congestion, sinus pressure and sore throat. No headaches; tinnitus, drooling, or problem swallowing. Cardiovascular: Negative for chest pain, palpitations, diaphoresis, dyspnea and peripheral edema. ; No orthopnea, PND Respiratory: Negative for  hemoptysis,  and stridor. No pleuritic chestpain. Gastrointestinal: Negative for nausea, vomiting, diarrhea, constipation, abdominal pain, melena, blood in stool, hematemesis, jaundice and rectal bleeding.    Genitourinary: Negative for frequency, dysuria, incontinence,flank pain and hematuria; Musculoskeletal: Negative for back pain and neck pain. Negative for swelling and trauma.;  Skin: . Negative for pruritus, rash, abrasions, bruising and skin lesion.; ulcerations Neuro: Negative for headache, lightheadedness and neck stiffness. Negative for  weakness, altered level of consciousness , altered mental status, extremity weakness, burning feet, involuntary movement, seizure and syncope.  Psych: negative for anxiety, depression, insomnia, tearfulness, panic attacks, hallucinations, paranoia, suicidal or homicidal ideation    Past Medical History  Diagnosis Date  . Anxiety   . Arthritis   . Hyperlipidemia   . Essential hypertension, benign   . Osteoporosis   . GERD (gastroesophageal reflux disease)   . Hearing loss   . Restless leg   . Tremor   . Leg edema   . Obese   . Gall bladder disease   . Cataract   . Carpal tunnel syndrome of left wrist   . Atrial fibrillation ([place])   . Sleep apnea   . Esophageal dilatation   . Frequent falls   . History of nuclear stress test 08/31/2012    Lexiscan cardiolite negative for ischemia  . Polyneuropathy in other diseases classified elsewhere ([place]) 10/03/2013  . Cardioembolic stroke ([place]) [id number]  . UTI (lower urinary tract infection)   . History of stroke with current residual effects 11/20/2014  . Seizures ([place])   . Focal motor seizure disorder ([place]) 12/24/2014    Left leg involvment  . Sequela, post-stroke 04/04/2015     Hemiparesis, nondominant hemisphere  . Focal seizures ([place]) 10/08/2015    Past Surgical History  Procedure Laterality Date  . Appendectomy    . Cholecystectomy    . Abdominal hysterectomy    . Fracture surgery      Left arm x4  . Benign cyst removed from kidney and lung, bilateral mastectomy    . Bilateral great toenail removal    . Knee arthroscopy  2012    Right knee, Dr Aline Brochure  . Left forearm    . Breast surgery      Bilateral 2000  .  Tonsillectomy    . R hand surgery    . Bronchoscopy  02/15/2000  . Right vats.  "  . Right upper lobe wedge resection.  "  . Upper gastrointestinal endoscopy  11/25/1999    Esophagitis/ Normal proximal esophagus, stomach and duodenum  . Colonoscopy  01/17/2009    Dr. Deatra Ina : Internal hemorrhoids/Diverticula,  scattered in the ascending colon/  Moderate diverticulosis ascending colon to sigmoid colon  . Esophagogastroduodenoscopy   04/23/2003    Dr. Deatra Ina: HIATAL HERNIA  . Colonoscopy  01/16/2001    Normal  . Cataract extraction, bilateral    . Esophagogastroduodenoscopy  03/20/2012    [id number] ring-LIKELY CAUSING MILD DYSPHAGIA/SMALL hiatal hernia/Multiple sessile polyps ranging between 3-78mm , path benign  . Cataract extraction    . Carpal tunnel release      Left wrist  . Transthoracic echocardiogram  06/24/2009    EF=>55%, mild assymetric LVH; LA mildly dilated; mild mitral annular calcif, borderline MVP, mild-mod MR; mild-mod TR, RV systolic pressure elevated, mild pulm HTN; AV mildly sclerotic; mild pulm valve regurg - ordered r/t bradycardia   . Endovenous ablation saphenous vein w/ laser  01/2011    Right GSV  . Cholecystectomy    . Intramedullary (im) nail intertrochanteric Left 03/25/2014    Procedure: INTRAMEDULLARY NAIL INTERTROCHANTRIC LEFT HIP;  Surgeon: Marianna Payment, MD;  Location: [facility];  Service: Orthopedics;  Laterality: Left;    Medications:  HOME MEDS: Prior to Admission medications   Medication Sig Start Date End Date Taking? Authorizing Provider  acetaminophen (TYLENOL) 500 MG tablet Take 1,000 mg by mouth every 6 (six) hours as needed for mild pain or moderate pain.   Yes Historical Provider, MD  ALPRAZolam Duanne Moron) 0.5 MG tablet Take 1.5 tablets (0.75 mg total) by mouth at bedtime as needed for anxiety. 10/06/15  Yes Kathrynn Ducking, MD  amLODipine (NORVASC) 10 MG tablet Take 1 tablet (10 mg total) by mouth daily. 10/28/15  Yes Fayrene Helper, MD  apixaban (ELIQUIS) 5 MG TABS tablet Take 1 tablet (5 mg total) by mouth 2 (two) times daily. 10/07/15  Yes Kathrynn Ducking, MD  atorvastatin (LIPITOR) 20 MG tablet TAKE ONE TABLET BY MOUTH ONCE DAILY 11/03/15  Yes Fayrene Helper, MD  busPIRone (BUSPAR) 5 MG tablet i tablet three times daily Patient taking  differently: Take 5 mg by mouth 3 (three) times daily. i tablet three times daily 10/28/15  Yes Fayrene Helper, MD  donepezil (ARICEPT) 10 MG tablet TAKE ONE TABLET BY MOUTH ONCE DAILY AT BEDTIME Patient taking differently: TAKE ONE TABLET BY MOUTH ONCE DAILY IN THE MORNING 09/01/15  Yes Fayrene Helper, MD  doxylamine, Sleep, (UNISOM) 25 MG tablet Take 25 mg by mouth at bedtime.    Yes Historical Provider, MD  esomeprazole (NEXIUM) 20 MG packet Take 20 mg by mouth daily before breakfast. 03/31/15  Yes Fayrene Helper, MD  gabapentin (NEURONTIN) 100 MG capsule TAKE ONE CAPSULE BY MOUTH IN THE MORNING AND TWO CAPS AT [place], THEN THREE CAPS  IN THE EVENING Patient taking differently: TAKE ONE CAPSULE BY MOUTH IN THE MORNING AND ONE CAP AT [place], THEN THREE CAPS  IN THE EVENING 10/31/15  Yes Kathrynn Ducking, MD  HYDROcodone-acetaminophen (NORCO/VICODIN) 5-325 MG tablet Take 1 tablet by mouth every 6 (six) hours as needed for moderate pain. Patient taking differently: Take 0.5-1 tablets by mouth every 6 (six) hours as needed for moderate pain.  10/30/15  Yes Elon Alas  Jannifer Franklin, MD  levETIRAcetam (KEPPRA) 750 MG tablet TAKE ONE TABLET BY MOUTH TWICE DAILY 09/02/15  Yes Kathrynn Ducking, MD  metoprolol (LOPRESSOR) 50 MG tablet TAKE ONE-HALF TABLET BY MOUTH THREE TIMES DAILY 11/13/15  Yes Fayrene Helper, MD  mirtazapine (REMERON) 30 MG tablet Take 1 tablet (30 mg total) by mouth at bedtime. 10/08/15  Yes Kathrynn Ducking, MD  montelukast (SINGULAIR) 10 MG tablet TAKE ONE TABLET BY MOUTH AT BEDTIME 10/31/15  Yes Fayrene Helper, MD  Vitamin D, Ergocalciferol, (DRISDOL) 50000 units CAPS capsule TAKE 1 CAPSULE BY MOUTH EVERY 7 DAYS. 08/12/15  Yes Fayrene Helper, MD  polyethylene glycol [facility] / GLYCOLAX) packet Take 17 g by mouth daily. Patient taking differently: Take 17 g by mouth daily as needed for mild constipation.  05/05/15   Carole Civil, MD     Allergies:  Allergies  Allergen  Reactions  . Codeine Hives  . Morphine And Related Itching and Other (See Comments)    Makes pt hallucinate  . Actonel [Risedronate Sodium] Other (See Comments)    Abdominal pain  . Fosamax [Alendronate Sodium] Other (See Comments)    Abdominal pian  . Losartan Other (See Comments)    Hospitalized in 06/2013 with pancreatitis, losartan is associated with increased risk of pancreatitis ,  Hence discontinued  . Nitrofurantoin Other (See Comments)    Hospitalized with pancreatitis in 06/2013. Nitrofurantoin implicated as a possible cause  . Penicillins     Social History:   reports that she has never smoked. She has never used smokeless tobacco. She reports that she does not drink alcohol or use illicit drugs.  Family History: Family History  Problem Relation Age of Onset  . Diabetes Mother   . Heart disease Mother   . Stroke Mother   . Cancer Sister     KIDNEY  . Emphysema Sister   . Heart disease Brother   . Heart disease Sister   . Emphysema Sister   . Cancer Sister     BREAST  . Heart disease Brother   . Colon cancer Sister   . Alzheimer's disease Father   . Heart disease Sister   . Tremor Sister   . Tremor Brother   . Alcoholism Child   . Cancer Brother   . Pancreatic cancer Brother   . Diabetes Son      Physical Exam: Filed Vitals:   11/22/15 0215 11/22/15 0230 11/22/15 0330 11/22/15 0505  BP:  124/73 130/95 156/66  Pulse: 88 89 95 71  Temp:    98 F (36.7 C)  TempSrc:    Oral  Resp: 23 25  22   Height:    5\' 7"  (1.702 m)  Weight:    61.2 kg (134 lb 14.7 oz)  SpO2: 91% 91% 90% 86%   Blood pressure 156/66, pulse 71, temperature 98 F (36.7 C), temperature source Oral, resp. rate 22, height 5\' 7"  (1.702 m), weight 61.2 kg (134 lb 14.7 oz), SpO2 86 %.  GEN:  Pleasant   patient lying in the stretcher in no acute distress; cooperative with exam. PSYCH:  alert and oriented x4; does not appear anxious or depressed; affect is appropriate. HEENT: Mucous  membranes pink and anicteric; PERRLA; EOM intact; no cervical lymphadenopathy nor thyromegaly or carotid bruit; no JVD; There were no stridor. Neck is very supple. Breasts:: Not examined CHEST WALL: No tenderness CHEST: Normal respiration, clear to auscultation bilaterally.  HEART: Regular rate and rhythm.  There are no  murmur, rub, or gallops.   BACK: No kyphosis or scoliosis; no CVA tenderness ABDOMEN: soft and non-tender; no masses, no organomegaly, normal abdominal bowel sounds; no pannus; no intertriginous candida. There is no rebound and no distention. Rectal Exam: Not done EXTREMITIES: No bone or joint deformity; age-appropriate arthropathy of the hands and knees; no edema; no ulcerations.  There is no calf tenderness. Genitalia: not examined PULSES: 2+ and symmetric SKIN: Normal hydration no rash or ulceration CNS: Cranial nerves 2-12 grossly intact no focal lateralizing neurologic deficit.  Speech is fluent; uvula elevated with phonation, facial symmetry and tongue midline. DTR are normal bilaterally, cerebella exam is intact, barbinski is negative and strengths are equaled bilaterally.  No sensory loss.   Labs on Admission:  Basic Metabolic Panel:  Recent Labs Lab 11/21/15 2205 11/22/15 0642  NA 139 141  K 3.5 3.3*  CL 104 104  CO2 28 30  GLUCOSE 106* 119*  BUN 11 7  CREATININE 0.40* 0.42*  CALCIUM 8.7* 9.3   Liver Function Tests:  Recent Labs Lab 11/21/15 2205 11/22/15 0642  AST 22 23  ALT 12* 13*  ALKPHOS 77 87  BILITOT 0.4 0.6  PROT 7.0 7.1  ALBUMIN 3.8 3.9    CBC:  Recent Labs Lab 11/21/15 2205 11/22/15 0642  WBC 10.2 10.1  NEUTROABS 6.5 8.2*  HGB 13.4 13.9  HCT 41.6 43.5  MCV 100.7* 99.5  PLT 203 220       Radiological Exams on Admission: Dg Chest 2 View  11/22/2015  CLINICAL DATA:  82 year old female with abnormal CT scan. EXAM: CHEST  2 VIEW COMPARISON:  Chest radiograph dated 05/29/2015 FINDINGS: Two views of the chest demonstrate airspace  opacity in the right lower lobe. There is a small right pleural effusion. Left lung base atelectatic changes noted. Right upper lobe surgical suture. There is no pneumothorax. There is moderate cardiomegaly. Osteopenia with degenerative changes of the spine. No acute fracture. IMPRESSION: Right lower lobe opacity and small right pleural effusion. Clinical correlation and follow-up recommended. No pneumothorax. Electronically Signed   By: Anner Crete M.D.   On: 11/22/2015 02:14   Ct Renal Stone Study  11/22/2015  CLINICAL DATA:  82 year old female with right flank pain and difficulty urinating EXAM: CT ABDOMEN AND PELVIS WITHOUT CONTRAST TECHNIQUE: Multidetector CT imaging of the abdomen and pelvis was performed following the standard protocol without IV contrast. COMPARISON:  CT dated 06/04/2015 FINDINGS: Evaluation of this exam is limited in the absence of intravenous contrast. Partially visualized small bilateral pleural effusions. There is consolidative changes of the right lung base which may represent compressive atelectasis versus pneumonia. This appearance is somewhat similar to prior study. The left pleural effusion appears slightly increased compared to the prior study. No intra-abdominal free air or free fluid. Cholecystectomy. The liver, pancreas, spleen, and adrenal glands appear unremarkable. Stable appearing 4 cm left renal hypodense lesion, incompletely characterized on this noncontrast study, most likely a cyst. Multiple nonobstructing bilateral renal calculi measuring up to 5 mm similar to prior study. There is no hydronephrosis on either side. Go the visualized ureters appear unremarkable. There is apparent diffuse thickening of the bladder wall which may be partly related to underdistention. Cystitis is not excluded. Correlation with urinalysis recommended. Hysterectomy. There is moderate stool throughout the colon. There are scattered colonic diverticula without active inflammatory  changes. There is no evidence of bowel obstruction or active inflammation. There is aortoiliac atherosclerotic disease. A 1 cm calcified splenic artery aneurysm. Evaluation of the vasculature  is limited in the absence of intravenous contrast. No portal venous gas identified. There is mild diffuse haziness of the mesentery, nonspecific. No adenopathy. The abdominal wall soft tissues appear unremarkable. There is osteopenia with degenerative changes of the spine. Left femoral intra medullary rod and transcervical fixation screw. No acute fracture. Bilateral breast implants. IMPRESSION: Multiple small nonobstructing bilateral renal calculi similar to prior study. No hydronephrosis. Partially visualized small bilateral pleural effusions with patchy area of consolidative changes at the lung bases, right greater left. Clinical correlation is recommended to evaluate for pneumonia. Colonic diverticulosis. No evidence of active inflammation or bowel obstruction. Electronically Signed   By: Anner Crete M.D.   On: 11/22/2015 00:59    EKG: Independently reviewed.    Assessment/Plan Present on Admission:  . CAP (community acquired pneumonia) . SPINAL STENOSIS, LUMBAR . Sleep apnea . Essential hypertension, benign . Chronic atrial fibrillation ([place]) . Pneumonia     PLAN:   #1. Community-acquired pneumonia - patient's pain in the right mid back is most likely from community acquired pneumonia. Given the patient's productive cough and chest x-ray findings at this time patient has been placed on ceftriaxone and Zithromax and we will closely observe. Check urine for Legionella and strep antigen. She is improving slightly and wanted to go home. #2. Chronic atrial fibrillation closely rate controlled. Chads 2 vasc score is more than 2 and patient is on Apixaban which will be continued along with metoprolol 25 mg 3 times a day. #3. Focal seizures - on Keppra. #4. Chronic pain/spinal stenosis - continue  Neurontin. #5. Hypertension - continue metoprolol.  BP is controlled.  #6. History of essential tremors. #7. 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   DVT prophylaxis: Apixaban. Code Status: DO NOT RESUSCITATE.  Family Communication: No family at the bedside.  Disposition Plan: Home.  Consults called: Physical therapy.  Admission status: Observation. MedSurg floor.    Orvan Falconer, MD.  FACP [facility] Pager [phone] 7pm to 7am.  11/22/2015, 1:25 PM

## 2015-11-23 DIAGNOSIS — R269 Unspecified abnormalities of gait and mobility: Secondary | ICD-10-CM

## 2015-11-23 LAB — URINE CULTURE

## 2015-11-23 MED ORDER — MUPIROCIN 2 % EX OINT: 1.0000 "application " | TOPICAL_OINTMENT | Freq: Two times a day (BID) | CUTANEOUS | Status: AC

## 2015-11-23 MED ORDER — PANTOPRAZOLE SODIUM 40 MG PO TBEC: 40.0000 mg | DELAYED_RELEASE_TABLET | Freq: Every day | ORAL | Status: AC

## 2015-11-23 MED ORDER — LEVOFLOXACIN 500 MG PO TABS: 500.0000 mg | ORAL_TABLET | Freq: Every day | ORAL | Status: DC

## 2015-11-23 NOTE — Progress Notes (Signed)
Pt son here to take home, expressed some concerns about discharge.  Paged Dr Marin Comment to notify and he will come see pt and family.

## 2015-11-23 NOTE — Discharge Summary (Signed)
Physician Discharge Summary  Erin [name] [facility] [id number] DOB: [birth year] DOA: 11/21/2015  PCP: Erin Nakayama, MD  Admit date: 11/21/2015 Discharge date: 11/23/2015  Time spent: 80 minutes  Recommendations for Outpatient Follow-up:  1. Follow up with PCP next week.    Discharge Diagnoses:  Principal Problem:   CAP (community acquired pneumonia) Active Problems:   Essential hypertension, benign   SPINAL STENOSIS, LUMBAR   UNSTEADY GAIT   Chronic atrial fibrillation (HCC)   Sleep apnea   Focal seizures (HCC)   Pneumonia   PNA (pneumonia)   Discharge Condition: improved.  Back pain is minimal.  No SOB.   Diet recommendation: as tolerated.   Filed Weights   11/22/15 0505  Weight: 61.2 kg (134 lb 14.7 oz)    History of present illness: Patient was admitted for right midback pain, found to have possible PNA, and was admitted by Dr Erin [name] on Nov 22, 2015.  As per his H and P:  " Erin [name] is a 82 y.o. female with medical history significant of chronic atrial fibrillation, hypertension, focal seizures, neuropathy and gait disturbance with previous history of stroke presents to the year because of increasing pain on the right mid back around the posterior aspect of the chest and flank area. Patient has been having these symptoms for last 2 days. Pain increases on deep inspiration and patient has been having some productive cough. UA was unremarkable. CT renal study was done shows multiple renal stones nonobstructive and also showed features concerning for pneumonia. Chest x-ray confirms pneumonic process. Patient is being admitted for pneumonia.    Hospital Course: Patient was admitted into the hospital and she was started on IV Rocephin and ZIthromax.  She was doing well as her back pain has improved as well.   The following day, she already wanted to go home, but was agreeable to stay for IV antibiotics.  She continued to improved, and is anxious to go home to her son.  She  is stable for discharge.  Will continue her on Levaquin 500mg  per day for another week.  She will follow up with her PCP next week.  Her code status has been DNR, and it will continue.  Thank you and Good Day.    Consultations:  None.   Discharge Exam: Filed Vitals:   11/22/15 2208 11/23/15 0630  BP: 137/82 139/57  Pulse: 86 78  Temp: 99.1 F (37.3 C) 98.4 F (36.9 C)  Resp: 20 20    Discharge Instructions   Discharge Instructions    Diet - low sodium heart healthy    Complete by:  As directed      Discharge instructions    Complete by:  As directed   Take your antibiotic as prescribed.  Follow up with your doctor next week.     Increase activity slowly    Complete by:  As directed           Current Discharge Medication List    START taking these medications   Details  levofloxacin (LEVAQUIN) 500 MG tablet Take 1 tablet (500 mg total) by mouth daily. Qty: 7 tablet, Refills: 0    mupirocin ointment (BACTROBAN) 2 % Place 1 application into the nose 2 (two) times daily. Qty: 22 g, Refills: 0    pantoprazole (PROTONIX) 40 MG tablet Take 1 tablet (40 mg total) by mouth daily before breakfast. Qty: 60 tablet, Refills: 1      CONTINUE these medications which have NOT CHANGED  Details  acetaminophen (TYLENOL) 500 MG tablet Take 1,000 mg by mouth every 6 (six) hours as needed for mild pain or moderate pain.    ALPRAZolam (XANAX) 0.5 MG tablet Take 1.5 tablets (0.75 mg total) by mouth at bedtime as needed for anxiety. Qty: 50 tablet, Refills: 1    amLODipine (NORVASC) 10 MG tablet Take 1 tablet (10 mg total) by mouth daily. Qty: 30 tablet, Refills: 3    apixaban (ELIQUIS) 5 MG TABS tablet Take 1 tablet (5 mg total) by mouth 2 (two) times daily. Qty: 60 tablet, Refills: 6    atorvastatin (LIPITOR) 20 MG tablet TAKE ONE TABLET BY MOUTH ONCE DAILY Qty: 30 tablet, Refills: 3    busPIRone (BUSPAR) 5 MG tablet i tablet three times daily Qty: 90 tablet, Refills: 3     donepezil (ARICEPT) 10 MG tablet TAKE ONE TABLET BY MOUTH ONCE DAILY AT BEDTIME Qty: 30 tablet, Refills: 2    doxylamine, Sleep, (UNISOM) 25 MG tablet Take 25 mg by mouth at bedtime.     gabapentin (NEURONTIN) 100 MG capsule TAKE ONE CAPSULE BY MOUTH IN THE MORNING AND TWO CAPS AT MIDAY, THEN THREE CAPS  IN THE EVENING Qty: 180 capsule, Refills: 0    HYDROcodone-acetaminophen (NORCO/VICODIN) 5-325 MG tablet Take 1 tablet by mouth every 6 (six) hours as needed for moderate pain. Qty: 60 tablet, Refills: 0    levETIRAcetam (KEPPRA) 750 MG tablet TAKE ONE TABLET BY MOUTH TWICE DAILY Qty: 60 tablet, Refills: 11    metoprolol (LOPRESSOR) 50 MG tablet TAKE ONE-HALF TABLET BY MOUTH THREE TIMES DAILY Qty: 45 tablet, Refills: 3    mirtazapine (REMERON) 30 MG tablet Take 1 tablet (30 mg total) by mouth at bedtime. Qty: 30 tablet, Refills: 5    montelukast (SINGULAIR) 10 MG tablet TAKE ONE TABLET BY MOUTH AT BEDTIME Qty: 30 tablet, Refills: 0    Vitamin D, Ergocalciferol, (DRISDOL) 50000 units CAPS capsule TAKE 1 CAPSULE BY MOUTH EVERY 7 DAYS. Qty: 4 capsule, Refills: 5    polyethylene glycol (MIRALAX / GLYCOLAX) packet Take 17 g by mouth daily. Qty: 14 each, Refills: 0      STOP taking these medications     esomeprazole (NEXIUM) 20 MG packet        Allergies  Allergen Reactions  . Codeine Hives  . Morphine And Related Itching and Other (See Comments)    Makes pt hallucinate  . Actonel [Risedronate Sodium] Other (See Comments)    Abdominal pain  . Fosamax [Alendronate Sodium] Other (See Comments)    Abdominal pian  . Losartan Other (See Comments)    Hospitalized in 06/2013 with pancreatitis, losartan is associated with increased risk of pancreatitis ,  Hence discontinued  . Nitrofurantoin Other (See Comments)    Hospitalized with pancreatitis in 06/2013. Nitrofurantoin implicated as a possible cause  . Penicillins       The results of significant diagnostics from this  hospitalization (including imaging, microbiology, ancillary and laboratory) are listed below for reference.    Significant Diagnostic Studies: Dg Chest 2 View  11/22/2015  CLINICAL DATA:  82 year old female with abnormal CT scan. EXAM: CHEST  2 VIEW COMPARISON:  Chest radiograph dated 05/29/2015 FINDINGS: Two views of the chest demonstrate airspace opacity in the right lower lobe. There is a small right pleural effusion. Left lung base atelectatic changes noted. Right upper lobe surgical suture. There is no pneumothorax. There is moderate cardiomegaly. Osteopenia with degenerative changes of the spine. No acute fracture. IMPRESSION: Right  lower lobe opacity and small right pleural effusion. Clinical correlation and follow-up recommended. No pneumothorax. Electronically Signed   By: Anner Crete M.D.   On: 11/22/2015 02:14   Ct Renal Stone Study  11/22/2015  CLINICAL DATA:  82 year old female with right flank pain and difficulty urinating EXAM: CT ABDOMEN AND PELVIS WITHOUT CONTRAST TECHNIQUE: Multidetector CT imaging of the abdomen and pelvis was performed following the standard protocol without IV contrast. COMPARISON:  CT dated 06/04/2015 FINDINGS: Evaluation of this exam is limited in the absence of intravenous contrast. Partially visualized small bilateral pleural effusions. There is consolidative changes of the right lung base which may represent compressive atelectasis versus pneumonia. This appearance is somewhat similar to prior study. The left pleural effusion appears slightly increased compared to the prior study. No intra-abdominal free air or free fluid. Cholecystectomy. The liver, pancreas, spleen, and adrenal glands appear unremarkable. Stable appearing 4 cm left renal hypodense lesion, incompletely characterized on this noncontrast study, most likely a cyst. Multiple nonobstructing bilateral renal calculi measuring up to 5 mm similar to prior study. There is no hydronephrosis on either  side. Go the visualized ureters appear unremarkable. There is apparent diffuse thickening of the bladder wall which may be partly related to underdistention. Cystitis is not excluded. Correlation with urinalysis recommended. Hysterectomy. There is moderate stool throughout the colon. There are scattered colonic diverticula without active inflammatory changes. There is no evidence of bowel obstruction or active inflammation. There is aortoiliac atherosclerotic disease. A 1 cm calcified splenic artery aneurysm. Evaluation of the vasculature is limited in the absence of intravenous contrast. No portal venous gas identified. There is mild diffuse haziness of the mesentery, nonspecific. No adenopathy. The abdominal wall soft tissues appear unremarkable. There is osteopenia with degenerative changes of the spine. Left femoral intra medullary rod and transcervical fixation screw. No acute fracture. Bilateral breast implants. IMPRESSION: Multiple small nonobstructing bilateral renal calculi similar to prior study. No hydronephrosis. Partially visualized small bilateral pleural effusions with patchy area of consolidative changes at the lung bases, right greater left. Clinical correlation is recommended to evaluate for pneumonia. Colonic diverticulosis. No evidence of active inflammation or bowel obstruction. Electronically Signed   By: Anner Crete M.D.   On: 11/22/2015 00:59    Microbiology: Recent Results (from the past 240 hour(s))  MRSA PCR Screening     Status: Abnormal   Collection Time: 11/22/15 11:40 AM  Result Value Ref Range Status   MRSA by PCR POSITIVE (A) NEGATIVE Final    Comment:        The GeneXpert MRSA Assay (FDA approved for NASAL specimens only), is one component of a comprehensive MRSA colonization surveillance program. It is not intended to diagnose MRSA infection nor to guide or monitor treatment for MRSA infections. RESULT CALLED TO, READ BACK BY AND VERIFIED WITH: BROWER,B. AT  1526 ON 11/22/2015 BY AGUNDIZ,E.      Labs: Basic Metabolic Panel:  Recent Labs Lab 11/21/15 2205 11/22/15 0642  NA 139 141  K 3.5 3.3*  CL 104 104  CO2 28 30  GLUCOSE 106* 119*  BUN 11 7  CREATININE 0.40* 0.42*  CALCIUM 8.7* 9.3   Liver Function Tests:  Recent Labs Lab 11/21/15 2205 11/22/15 0642  AST 22 23  ALT 12* 13*  ALKPHOS 77 87  BILITOT 0.4 0.6  PROT 7.0 7.1  ALBUMIN 3.8 3.9   No results for input(s): LIPASE, AMYLASE in the last 168 hours. No results for input(s): AMMONIA in the last 168  hours. CBC:  Recent Labs Lab 11/21/15 2205 11/22/15 0642  WBC 10.2 10.1  NEUTROABS 6.5 8.2*  HGB 13.4 13.9  HCT 41.6 43.5  MCV 100.7* 99.5  PLT 203 220    Signed:  Sherrelle Prochazka MD. Rosalita Chessman.  [facility] 11/23/2015, 9:22 AM

## 2015-11-23 NOTE — Progress Notes (Signed)
Still awaiting EMS to transport pt home.  Scheduled meds given. Will continue to monitor until pt leaves the floor.

## 2015-11-24 ENCOUNTER — Ambulatory Visit: Payer: Medicare Other | Admitting: Orthopedic Surgery

## 2015-11-24 ENCOUNTER — Encounter: Payer: Self-pay | Admitting: *Deleted

## 2015-11-24 ENCOUNTER — Other Ambulatory Visit: Payer: Self-pay | Admitting: *Deleted

## 2015-11-24 LAB — LEGIONELLA PNEUMOPHILA SEROGP 1 UR AG: L. pneumophila Serogp 1 Ur Ag: NEGATIVE

## 2015-11-24 LAB — URINE CULTURE: Culture: NO GROWTH

## 2015-11-24 NOTE — Patient Outreach (Signed)
[name]) Care Management  11/24/2015  Erin [name] [facility] Jan 28, 1930 [id number]  Erin [name] was discharged from [facility] on Sunday afternoon after having been admitted on Friday when she presented to the ED with lower back and flank pain. I spoke with her son/primary caregiver/HCPOA Erin [name] today by phone. He reports that Erin [name] is "very weak" but is taking her oral antibiotics and is able to take food and fluids, although less than usual. He denies that Erin [name] has had fever, chest pain, shortness of breath, or other new or worsened symptom. Patient was recently discharged from hospital and all medications have been reviewed. Erin [name] has an appointment with her primary care provider, Dr. Tula [name] on 12/04/15.   Plan: I will follow up with Erin [name] by phone and am scheduled to see her at home in June for a routine home visit. Her son Erin [name] or her grandson Erin [name], who provides care during the daytime hours, will call for new or worsened symptoms or fever or other question or concern. I will be happy to see Erin [name] at home if needed between now and her visit with Dr. Moshe [name] or my previously scheduled visit.    [place] [facility]  [phone]

## 2015-11-25 ENCOUNTER — Ambulatory Visit: Payer: Medicare Other | Admitting: Orthopedic Surgery

## 2015-11-26 ENCOUNTER — Other Ambulatory Visit: Payer: Self-pay | Admitting: *Deleted

## 2015-11-26 NOTE — Patient Outreach (Signed)
[place] [facility]) Care Management  11/26/2015  Rosibel Bueter [facility] [birth year] [id number]   I returned a call to Mrs. Hlavacek's grandson and primary daytime caregiver, Empress Conti. [name] asked if I might be able to change my scheduled appointment with Mrs. Paola to an earlier date (currently scheduled for 12/11/15) given Mrs. Shahin's recent hospital stay.   Plan: I will see Mrs. Fawcett on Friday, May 18.    [place] Management  [phone]

## 2015-11-27 ENCOUNTER — Other Ambulatory Visit: Payer: Self-pay | Admitting: Neurology

## 2015-11-27 ENCOUNTER — Other Ambulatory Visit: Payer: Self-pay | Admitting: Family Medicine

## 2015-11-28 ENCOUNTER — Ambulatory Visit: Payer: Self-pay | Admitting: *Deleted

## 2015-12-01 ENCOUNTER — Encounter: Payer: Self-pay | Admitting: *Deleted

## 2015-12-01 ENCOUNTER — Other Ambulatory Visit: Payer: Self-pay | Admitting: *Deleted
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
## 2015-12-02 ENCOUNTER — Telehealth: Payer: Self-pay | Admitting: Neurology

## 2015-12-02 MED ORDER — HYDROCODONE-ACETAMINOPHEN 5-325 MG PO TABS: 1.0000 | ORAL_TABLET | Freq: Four times a day (QID) | ORAL | Status: DC | PRN

## 2015-12-02 NOTE — Telephone Encounter (Signed)
Patient grandson requesting refill of HYDROcodone-acetaminophen (NORCO/VICODIN) 5-325 MG tablet

## 2015-12-02 NOTE — Telephone Encounter (Signed)
Printed and placed in Dr. Jannifer Franklin' office for signature

## 2015-12-03 ENCOUNTER — Other Ambulatory Visit: Payer: Self-pay | Admitting: Family Medicine

## 2015-12-03 NOTE — Telephone Encounter (Signed)
Signed and placed up front for pick up.  

## 2015-12-04 ENCOUNTER — Encounter: Payer: Self-pay | Admitting: Family Medicine

## 2015-12-04 ENCOUNTER — Ambulatory Visit (HOSPITAL_COMMUNITY)
Admission: RE | Admit: 2015-12-04 | Discharge: 2015-12-04 | Disposition: A | Discharge status: Home / Self Care | Payer: Medicare Other | Source: Ambulatory Visit | Attending: Family Medicine | Admitting: Family Medicine

## 2015-12-04 ENCOUNTER — Ambulatory Visit (INDEPENDENT_AMBULATORY_CARE_PROVIDER_SITE_OTHER): Payer: Medicare Other | Admitting: Family Medicine

## 2015-12-04 VITALS — BP 122/70 | HR 92 | Resp 16 | Ht 67.0 in | Wt 127.8 lb

## 2015-12-04 DIAGNOSIS — M810 Age-related osteoporosis without current pathological fracture: Secondary | ICD-10-CM

## 2015-12-04 DIAGNOSIS — I6930 Unspecified sequelae of cerebral infarction: Secondary | ICD-10-CM | POA: Diagnosis not present

## 2015-12-04 DIAGNOSIS — F411 Generalized anxiety disorder: Secondary | ICD-10-CM | POA: Diagnosis not present

## 2015-12-04 DIAGNOSIS — J189 Pneumonia, unspecified organism: Secondary | ICD-10-CM

## 2015-12-04 DIAGNOSIS — J181 Lobar pneumonia, unspecified organism: Secondary | ICD-10-CM

## 2015-12-04 DIAGNOSIS — R918 Other nonspecific abnormal finding of lung field: Secondary | ICD-10-CM | POA: Insufficient documentation

## 2015-12-04 DIAGNOSIS — R079 Chest pain, unspecified: Secondary | ICD-10-CM | POA: Insufficient documentation

## 2015-12-04 DIAGNOSIS — J90 Pleural effusion, not elsewhere classified: Secondary | ICD-10-CM

## 2015-12-04 DIAGNOSIS — F03918 Unspecified dementia, unspecified severity, with other behavioral disturbance: Secondary | ICD-10-CM

## 2015-12-04 DIAGNOSIS — Z09 Encounter for follow-up examination after completed treatment for conditions other than malignant neoplasm: Secondary | ICD-10-CM

## 2015-12-04 DIAGNOSIS — G4734 Idiopathic sleep related nonobstructive alveolar hypoventilation: Secondary | ICD-10-CM

## 2015-12-04 DIAGNOSIS — F0391 Unspecified dementia with behavioral disturbance: Secondary | ICD-10-CM

## 2015-12-04 DIAGNOSIS — F4321 Adjustment disorder with depressed mood: Secondary | ICD-10-CM

## 2015-12-04 DIAGNOSIS — I10 Essential (primary) hypertension: Secondary | ICD-10-CM

## 2015-12-04 DIAGNOSIS — Z634 Disappearance and death of family member: Secondary | ICD-10-CM

## 2015-12-04 DIAGNOSIS — R269 Unspecified abnormalities of gait and mobility: Secondary | ICD-10-CM

## 2015-12-04 MED ORDER — FLUOXETINE HCL (PMDD) 20 MG PO TABS: ORAL_TABLET | ORAL | Status: DC

## 2015-12-04 NOTE — Patient Instructions (Signed)
F/u as before, call if you need me sooner  CXR today due to pain  New additional medication for depression  Hope you continue to feel better, one day at a time

## 2015-12-04 NOTE — Progress Notes (Signed)
   Subjective:    Patient ID: Erin [name], female    DOB: 24-Dec-1929, 82 y.o.   MRN: [id number]  HPI Hospitalized 5/12 to 11/23/2015 with a diagnosis of  pneumonia, here for follow up with her grandson and her caregiver Still c/o right lower chest pain, still has a cough,and states she often cries herself to sleep. Has swelling over right eye, and head remains bent down most of the time. Appetite reportedly fair and bowel movements are regular. Caregiver denies any skin breakdown    Review of Systems See HPI Denies recent fever or chills. Denies sinus pressure, nasal congestion, ear pain or sore throat.  Denies chest pains, palpitations and leg swelling Denies abdominal pain, nausea, vomiting,diarrhea or constipation.   Denies dysuria, frequency, hesitancy or incontinence. Chronic  joint pain,  and limitation in mobility. Denies headaches, seizures, numbness, or tingling. C/o depression, feel "tired"/ giving up, and sleep disturbance        Objective:   Physical Exam BP 122/70 mmHg  Pulse 92  Resp 16  Ht 5\' 7"  (1.702 m)  Wt 127 lb 12.8 oz (57.97 kg)  BMI 20.01 kg/m2  SpO2 90%  Patient alert and oriented and in no cardiopulmonary distress.Neck held in flexion over 80  % of the time  HEENT: No facial asymmetry, EOMI,   oropharynx pink and moist.  Neck decreased ROM no JVD, no mass.  Chest: adequate air entry, decreased in RLL, crackles in right lung base  CVS: S1, S2 systolic murmur, no S3.Irregular rate.  ABD: Soft non tender.   Ext: No edema  MS: markedly reduced  ROM spine, shoulders, hips and knees.  Skin: Intact, no ulcerations or rash noted   Psych: poor eye contact,flat affect.  depressed appearing.  CNS: CN 2-12 intact,  Reduced power, and tone throughout       Assessment & Plan:  Hospital discharge follow-up S/p 2 day hospitalization for CAP, still c/o significant Right lower posterior chest pain, also requests appt with pulmonary Doc she has  seen in the past. Will rept CXR, if significant effusion still present she will be referred  Grief at loss of child Pt depressed, and appropriately so, will add fluoxetine in the hope that will be beneficial.  Has great family support  Essential hypertension, benign Controlled, no change in medication   CAP (community acquired pneumonia) Afebrile and appetite back to normal, however, cough still [preseent and continues to c/o right lower chest pain, rept CXr   Osteoporosis, senile Severe, pt has increased neck flexion and reduced mobility, is at high fall risk, has had Pt/oT at home and her grandson does exercises with her  Senile dementia with behavioral disturbance Stable on aricept and also followed by neurology  Pleural effusion Right sideded lower chest pain, and persistent pleural effusion, trefer to pulmonologist

## 2015-12-05 DIAGNOSIS — Z09 Encounter for follow-up examination after completed treatment for conditions other than malignant neoplasm: Secondary | ICD-10-CM | POA: Insufficient documentation

## 2015-12-05 NOTE — Assessment & Plan Note (Signed)
Severe, pt has increased neck flexion and reduced mobility, is at high fall risk, has had Pt/oT at home and her grandson does exercises with her

## 2015-12-05 NOTE — Assessment & Plan Note (Signed)
Afebrile and appetite back to normal, however, cough still [preseent and continues to c/o right lower chest pain, rept CXr

## 2015-12-05 NOTE — Assessment & Plan Note (Signed)
Controlled, no change in medication  

## 2015-12-05 NOTE — Assessment & Plan Note (Signed)
Stable on aricept and also followed by neurology

## 2015-12-05 NOTE — Assessment & Plan Note (Signed)
Right sideded lower chest pain, and persistent pleural effusion, trefer to pulmonologist

## 2015-12-05 NOTE — Assessment & Plan Note (Signed)
S/p 2 day hospitalization for CAP, still c/o significant Right lower posterior chest pain, also requests appt with pulmonary Doc she has seen in the past. Will rept CXR, if significant effusion still present she will be referred

## 2015-12-05 NOTE — Assessment & Plan Note (Signed)
Pt depressed, and appropriately so, will add fluoxetine in the hope that will be beneficial.  Has great family support

## 2015-12-07 ENCOUNTER — Other Ambulatory Visit: Payer: Self-pay | Admitting: Neurology

## 2015-12-09 ENCOUNTER — Other Ambulatory Visit: Payer: Self-pay | Admitting: Neurology

## 2015-12-10 ENCOUNTER — Telehealth: Payer: Self-pay | Admitting: Neurology

## 2015-12-10 ENCOUNTER — Other Ambulatory Visit: Payer: Self-pay | Admitting: *Deleted

## 2015-12-10 MED ORDER — ALPRAZOLAM 0.5 MG PO TABS: ORAL_TABLET | ORAL | Status: AC

## 2015-12-10 NOTE — Telephone Encounter (Signed)
Message For: [facility]                  Taken 31-MAY-17 at  1:13PM by KAF ------------------------------------------------------------  Caller  [name] Yolanda Bonine               CID  [id number]   Patient  Erin [name]         Pt's Dr  Jannifer Franklin        Area Code  336  Phone#  [phone] *  DOB  [birth year] [place] PT XANAX/ PHARMACY STILL    HASNT RECEIVED                                        Disp:Y/N  N  If Y = C/B If No Response In 51minutes

## 2015-12-10 NOTE — Telephone Encounter (Signed)
Rx printed, awaiting signature. 

## 2015-12-10 NOTE — Telephone Encounter (Signed)
Spoke to pt's pharmacy who reported that rx is ready to pick up. It was faxed over earlier today. Duplicate rx destroyed.

## 2015-12-10 NOTE — Telephone Encounter (Signed)
Rx printed, signed, faxed to pharmacy. 

## 2015-12-11 ENCOUNTER — Ambulatory Visit: Payer: Self-pay | Admitting: *Deleted

## 2015-12-15 ENCOUNTER — Encounter: Payer: Self-pay | Admitting: Thoracic Surgery (Cardiothoracic Vascular Surgery)

## 2015-12-16 ENCOUNTER — Institutional Professional Consult (permissible substitution) (INDEPENDENT_AMBULATORY_CARE_PROVIDER_SITE_OTHER): Payer: Medicare Other | Admitting: Thoracic Surgery (Cardiothoracic Vascular Surgery)

## 2015-12-16 ENCOUNTER — Encounter: Payer: Self-pay | Admitting: Thoracic Surgery (Cardiothoracic Vascular Surgery)

## 2015-12-16 VITALS — BP 125/72 | HR 56 | Resp 16 | Ht 67.0 in | Wt 127.0 lb

## 2015-12-16 DIAGNOSIS — J90 Pleural effusion, not elsewhere classified: Secondary | ICD-10-CM

## 2015-12-16 DIAGNOSIS — J948 Other specified pleural conditions: Secondary | ICD-10-CM

## 2015-12-16 MED ORDER — PREDNISONE 20 MG PO TABS: 20.0000 mg | ORAL_TABLET | Freq: Every day | ORAL | Status: DC

## 2015-12-16 NOTE — Progress Notes (Signed)
[address]       [place] [address]             [phone]       HPI: 82 year old woman sent for consultation regarding a right pleural effusion.  Mrs. Gockley is an 82 year old woman with multiple severe medical problems including chronic atrial fibrillation, congestive heart failure, tricuspid regurgitation, previous stroke, hypertension, sleep apnea, severe kyphoscoliosis, tremor, generalized muscle weakness, focal seizures, and senile dementia.  I saw her in the fall of 2016 for bilateral pleural effusions. These were relatively small and did not worsen after 6 week follow-up. No intervention was needed.  She recently was treated for pneumonia. She has had a persistent cough and "foamy" sputum. She is no longer having fevers or chills. She says she feels short of breath when she wakes up in the mornings  Past Medical History  Diagnosis Date  . Anxiety   . Arthritis   . Hyperlipidemia   . Essential hypertension, benign   . Osteoporosis   . GERD (gastroesophageal reflux disease)   . Hearing loss   . Restless leg   . Tremor   . Leg edema   . Obese   . Gall bladder disease   . Cataract   . Carpal tunnel syndrome of left wrist   . Atrial fibrillation ([place])   . Sleep apnea   . Esophageal dilatation   . Frequent falls   . History of nuclear stress test 08/31/2012    Lexiscan cardiolite negative for ischemia  . Polyneuropathy in other diseases classified elsewhere ([place]) 10/03/2013  . Cardioembolic stroke ([place]) [id number]  . UTI (lower urinary tract infection)   . History of stroke with current residual effects 11/20/2014  . Seizures ([place])   . Focal motor seizure disorder ([place]) 12/24/2014    Left leg involvment  . [place], post-stroke 04/04/2015     Hemiparesis, nondominant hemisphere  . Focal seizures ([place]) 10/08/2015     Current Outpatient Prescriptions  Medication Sig Dispense Refill  . acetaminophen (TYLENOL) 500 MG tablet Take 1,000 mg by  mouth every 6 (six) hours as needed for mild pain or moderate pain.    [name] ALPRAZolam (XANAX) 0.5 MG tablet TAKE ONE & ONE-HALF TABLETS BY MOUTH ONCE DAILY AT BEDTIME AS NEEDED FOR ANXIETY 50 tablet 3  . amLODipine (NORVASC) 10 MG tablet Take 1 tablet (10 mg total) by mouth daily. 30 tablet 3  . apixaban (ELIQUIS) 5 MG TABS tablet Take 1 tablet (5 mg total) by mouth 2 (two) times daily. 60 tablet 6  . atorvastatin (LIPITOR) 20 MG tablet TAKE ONE TABLET BY MOUTH ONCE DAILY 30 tablet 3  . busPIRone (BUSPAR) 5 MG tablet i tablet three times daily (Patient taking differently: Take 5 mg by mouth 3 (three) times daily. i tablet three times daily) 90 tablet 3  . donepezil (ARICEPT) 10 MG tablet TAKE ONE TABLET BY MOUTH ONCE DAILY AT BEDTIME 30 tablet 4  . doxylamine, Sleep, (UNISOM) 25 MG tablet Take 25 mg by mouth at bedtime. Reported on 11/24/2015    . Fluoxetine HCl, PMDD, 20 MG TABS One tablet once daily 30 tablet 2  . gabapentin (NEURONTIN) 100 MG capsule TAKE ONE CAPSULE BY MOUTH IN THE MORNING AND TWO CAPSULES AT MIDDAY, THEN THREE CAPSULES IN THE EVENING 180 capsule 5  . HYDROcodone-acetaminophen (NORCO/VICODIN) 5-325 MG tablet Take 1 tablet by mouth every 6 (six) hours as needed for moderate pain. 60 tablet 0  .  levETIRAcetam (KEPPRA) 750 MG tablet TAKE ONE TABLET BY MOUTH TWICE DAILY 60 tablet 11  . metoprolol (LOPRESSOR) 50 MG tablet TAKE ONE-HALF TABLET BY MOUTH THREE TIMES DAILY 45 tablet 3  . mirtazapine (REMERON) 30 MG tablet Take 1 tablet (30 mg total) by mouth at bedtime. 30 tablet 5  . montelukast (SINGULAIR) 10 MG tablet TAKE ONE TABLET BY MOUTH AT BEDTIME 30 tablet 3  . mupirocin ointment (BACTROBAN) 2 % Place 1 application into the nose 2 (two) times daily. 22 g 0  . pantoprazole (PROTONIX) 40 MG tablet Take 1 tablet (40 mg total) by mouth daily before breakfast. 60 tablet 1  . polyethylene glycol (MIRALAX / GLYCOLAX) packet Take 17 g by mouth daily. (Patient taking differently: Take 17 g  by mouth daily as needed for mild constipation. ) 14 each 0  . Vitamin D, Ergocalciferol, (DRISDOL) 50000 units CAPS capsule TAKE 1 CAPSULE BY MOUTH EVERY 7 DAYS. 4 capsule 5  . predniSONE (DELTASONE) 20 MG tablet Take 1 tablet (20 mg total) by mouth daily with breakfast. 21 tablet 0   No current facility-administered medications for this visit.    Physical Exam BP 125/72 mmHg  Pulse 56  Resp 16  Ht 5\' 7"  (1.702 m)  Wt 127 lb (57.607 kg)  BMI 19.89 kg/m2  SpO37 39% 82 year old woman appears chronically ill, wheelchair-bound, severe kyphoscoliosis, unable to hold head in erect position. Cardiac rhythm irregular Lungs with diminished breath sounds bilaterally, greatest in right base Severe Edema both lower extremities  Diagnostic Tests: CHEST 2 VIEW  COMPARISON: Nov 22, 2015.  FINDINGS: Stable cardiomediastinal silhouette. Left upper lobe is not visualized due to patient's overlying head. Visualized portion of left lung appears normal. Stable moderate right pleural effusion is noted with probable underlying atelectasis or infiltrate. Visualized portion of bony thorax appears intact. No pneumothorax is noted.  IMPRESSION: Stable moderate right pleural effusion is noted with probable underlying atelectasis or infiltrate.   Electronically Signed  By: Marijo Conception, M.D.  On: 12/04/2015 16:58 I personally reviewed her chest x-ray and concur with findings as noted above.  Impression: 82 year old woman with a plethora of medical problems who has a right pleural effusion. This has developed in the post pneumonia setting. This may be a parapneumonic effusion. Alternatively could be related to heart failure as she certainly has severe peripheral edema. There is no left effusion to speak of at this time.  She is not a candidate for any type of aggressive intervention. She is on Eliquis so thoracentesis is not possible at this visit.  I think the best option would be to  give her a trial of steroids and see if that helped the effusion resolved. I gave her prescription for prednisone 20 mg tablets 1 by mouth daily for 3 weeks. I'll see her back with a chest x-ray after that  Plan: Prednisone 20 mg daily  Return in 3 weeks with PA and lateral chest x-ray  Melrose Nakayama, MD [facility] [phone]

## 2015-12-19 ENCOUNTER — Encounter: Payer: Self-pay | Admitting: Licensed Clinical Social Worker

## 2015-12-19 ENCOUNTER — Other Ambulatory Visit: Payer: Self-pay | Admitting: Licensed Clinical Social Worker

## 2015-12-19 NOTE — Patient Outreach (Signed)
Assessment:  CSW spoke via phone with Erin [name], grandson of client, on 12/19/15. CSW verified identity of Erin [name], [place] and CSW spoke of client needs. Client has strong family support. She is receiving daily in home care support for activities of daily living. Client needs assistance  with short distance ambulation. Client is eating small meals regularly. Client has medications and is taking medications as prescribed. Client sees [name] as primary care doctor.  Client had appointment with [name] in May of 2017.  Client is scheduled to see [name] again on 12/23/15.  CSW and Erin spoke of client care plan. CSW encouraged that client attend all scheduled client medical appointments in next 30 days with transport assistance arranged as needed for client.  Client is using oxgyen as prescribed at night to help with breathing.  Clent  takes pain medication as prescribed.  Client is not receiving in home care support from a home health agency at present. Family members are providing client care in the home 24/7.  CSW and Erin spoke of client management of grief issues over death of client's son. Erin said that this coming week will be difficult for client since it will be the anniversary of her deceased son's birthday. CSW encouraged client or Erin [name] or Erin [name] to call  CSW as needed to discuss grief issues faced by client. CSW encouraged client or Erin or Erin [name] to call CSW to discuss social work needs of client. Erin said that family member are trying to provide client care needed and hope that client can still remain at home.  CSW reminded Erin that RN [name] is providing [place] care for client and client or family representative can communicate with Delrae Rend as needed to discuss nursing needs of client. CSW thanked Erin for phone call with CSW on 12/19/15.    Plan:  Client to attend all scheduled client medical appointments in next 30 days with transport assistance arranged as needed for client. CSW to  call client/Erin [name] in 4 weeks to assess client needs.  [name].Harm Jou MSW, LCSW Licensed Clinical Social Worker [facility] Care Management [phone]

## 2015-12-23 ENCOUNTER — Encounter: Payer: Self-pay | Admitting: Thoracic Surgery (Cardiothoracic Vascular Surgery)

## 2015-12-23 ENCOUNTER — Ambulatory Visit (INDEPENDENT_AMBULATORY_CARE_PROVIDER_SITE_OTHER): Payer: Medicare Other | Admitting: Family Medicine

## 2015-12-23 ENCOUNTER — Encounter: Payer: Self-pay | Admitting: Family Medicine

## 2015-12-23 VITALS — BP 134/80 | HR 70 | Resp 18 | Ht 67.0 in | Wt 117.1 lb

## 2015-12-23 DIAGNOSIS — J90 Pleural effusion, not elsewhere classified: Secondary | ICD-10-CM

## 2015-12-23 DIAGNOSIS — G4700 Insomnia, unspecified: Secondary | ICD-10-CM

## 2015-12-23 DIAGNOSIS — F329 Major depressive disorder, single episode, unspecified: Secondary | ICD-10-CM

## 2015-12-23 DIAGNOSIS — F32A Depression, unspecified: Secondary | ICD-10-CM

## 2015-12-23 DIAGNOSIS — I10 Essential (primary) hypertension: Secondary | ICD-10-CM | POA: Diagnosis not present

## 2015-12-23 DIAGNOSIS — E785 Hyperlipidemia, unspecified: Secondary | ICD-10-CM

## 2015-12-23 DIAGNOSIS — L819 Disorder of pigmentation, unspecified: Secondary | ICD-10-CM | POA: Diagnosis not present

## 2015-12-23 DIAGNOSIS — K219 Gastro-esophageal reflux disease without esophagitis: Secondary | ICD-10-CM

## 2015-12-23 DIAGNOSIS — F411 Generalized anxiety disorder: Secondary | ICD-10-CM

## 2015-12-23 NOTE — Patient Instructions (Signed)
F/U in 10 weeks, call if you need me sooner  You are  Referred to Dr Nevada Crane re blister on left leg  Kidneys and blood test were good  Thankful slowly improving

## 2015-12-23 NOTE — Progress Notes (Signed)
   Subjective:    Patient ID: Erin [name], female    DOB: [birth year], 83 y.o.   MRN: [id number]  HPI   [name]     MRN: [id number]      DOB: 11-Feb-1930   HPI [name] is here for follow up and re-evaluation of chronic medical conditions, medication management and review of any available recent lab and radiology data.  Preventive health is updated, specifically  Cancer screening and Immunization.   Questions or concerns regarding consultations or procedures which the PT has had in the interim are  Addressed.Currently on steroids form cardiothoracic surgeon for  The PT denies any adverse reactions to current medications since the last visit.  There are no new concerns.  There are no specific complaints   ROS Denies recent fever or chills. Denies sinus pressure, nasal congestion, ear pain or sore throat. Chronic chest congestion,  Cough and intermittent wheezing. Denies chest pains, palpitations and leg swelling Denies abdominal pain, nausea, vomiting,diarrhea or constipation.   Denies dysuria, frequency, hesitancy or incontinence. Chronic  joint pain,  and limitation in mobility. Denies headaches, seizures, numbness, or tingling. C/o depression, anxiety and  Insomnia.Biut all have improved C/o rash to left leg, wants to know what it is , wants it checked.   PE  BP 134/80 mmHg  Pulse 70  Resp 18  Ht 5\' 7"  (1.702 m)  Wt 117 lb 1.3 oz (53.107 kg)  BMI 18.33 kg/m2  SpO2 94%  Patient alert and oriented and in no cardiopulmonary distress.  HEENT: No facial asymmetry, EOMI,   oropharynx pink and moist.  Neck supple no JVD, no mass.  Chest: Clear to auscultation bilaterally.  CVS: S1, S2 no murmurs, no S3.Regular rate.  ABD: Soft non tender.   Ext: No edema  MS: Adequate ROM spine, shoulders, hips and knees.  Skin: Intact, no ulcerations or rash noted.  Psych: Good eye contact, . Memory loss,less anxious and  depressed appearing.  CNS: CN 2-12 intact,  power,  normal throughout.no focal deficits noted.Decreased tone in upper and lower exremities due to muscle loss from reduced activity   Assessment & Plan  Depression Improved continue current medication  Pigmented skin lesion Enlarging hyperpigmented lesion on left leg, refer to dermatology  Essential hypertension, benign Controlled, no change in medication   Pleural effusion Currently on steroids by thoracic surgeon  GERD Controlled, no change in medication   Insomnia Sleep hygiene reviewed and written information offered also. Prescription sent for  medication needed.   Hyperlipidemia LDL goal <70 Hyperlipidemia:Low fat diet discussed and encouraged.   Lipid Panel  Lab Results  Component Value Date   CHOL 146 02/17/2015   HDL 52 02/17/2015   LDLCALC 79 02/17/2015   TRIG 74 02/17/2015   CHOLHDL 2.8 02/17/2015   Updated lab needed at/ before next visit.      Anxiety state Improved         Review of Systems     Objective:   Physical Exam        Assessment & Plan:

## 2015-12-26 ENCOUNTER — Other Ambulatory Visit: Payer: Self-pay | Admitting: *Deleted

## 2015-12-26 NOTE — Patient Outreach (Addendum)
[facility]) Care Management  12/26/2015  Erin [name] Erin [name] 1930/05/30 [id number]  I spoke with Mr. Erin [name] today to follow up on Mrs. Erin [name]'s progress and general condition. Mr. Erin [name] reports that Mrs. Erin [name] has "had a pretty good week for her". He reports that she has recently been seen in the office by [name] and was started on Fluoxetine in light of the response to the recent loss of her son. Mrs. Erin [name] was seen also by Dr. Modesto Charon for follow up of chronic pleural effusion. He has ordered new imaging and Mrs. Erin [name] will have this done then follow up with Dr. Roxan Hockey again in the office in 2 weeks. Dr. Roxan Hockey started Mrs. Erin [name] on oral steroids which she has been taking as directed. She is also on O2 @ 2l/[place] at night now and her son says he feels this has really helped her to rest better and feel better during the day. He is concerned about a "place on her foot" which was addressed by [name] who has made a referral to Dr. Nevada Crane, Dermatology.   Plan: I have scheduled an appointment to see Mrs. Erin [name] at home on Tuesday, 12/30/15 @ 1:30pm.    [facility]  [phone]

## 2015-12-28 DIAGNOSIS — F32A Depression, unspecified: Secondary | ICD-10-CM | POA: Insufficient documentation

## 2015-12-28 DIAGNOSIS — F329 Major depressive disorder, single episode, unspecified: Secondary | ICD-10-CM | POA: Insufficient documentation

## 2015-12-28 NOTE — Assessment & Plan Note (Signed)
Controlled, no change in medication  

## 2015-12-28 NOTE — Assessment & Plan Note (Signed)
Sleep hygiene reviewed and written information offered also. Prescription sent for  medication needed.  

## 2015-12-28 NOTE — Assessment & Plan Note (Signed)
Enlarging hyperpigmented lesion on left leg, refer to dermatology

## 2015-12-28 NOTE — Assessment & Plan Note (Addendum)
Currently on steroids by thoracic surgeon No response to effusion with steroids, and pt has documented nocturnal hypoxemia, hence order placed for nocturnal oxygen at 2L/minute.

## 2015-12-28 NOTE — Assessment & Plan Note (Signed)
Improved

## 2015-12-28 NOTE — Assessment & Plan Note (Signed)
Improved continue current medication 

## 2015-12-28 NOTE — Assessment & Plan Note (Signed)
Hyperlipidemia:Low fat diet discussed and encouraged.   Lipid Panel  Lab Results  Component Value Date   CHOL 146 02/17/2015   HDL 52 02/17/2015   LDLCALC 79 02/17/2015   TRIG 74 02/17/2015   CHOLHDL 2.8 02/17/2015   Updated lab needed at/ before next visit.

## 2015-12-30 ENCOUNTER — Other Ambulatory Visit: Payer: Self-pay | Admitting: *Deleted

## 2015-12-30 ENCOUNTER — Encounter: Payer: Self-pay | Admitting: *Deleted
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
## 2016-01-01 ENCOUNTER — Encounter: Payer: Self-pay | Admitting: Family Medicine

## 2016-01-05 ENCOUNTER — Telehealth: Payer: Self-pay | Admitting: Neurology

## 2016-01-05 ENCOUNTER — Other Ambulatory Visit: Payer: Self-pay | Admitting: Thoracic Surgery (Cardiothoracic Vascular Surgery)

## 2016-01-05 DIAGNOSIS — J90 Pleural effusion, not elsewhere classified: Secondary | ICD-10-CM

## 2016-01-05 MED ORDER — HYDROCODONE-ACETAMINOPHEN 5-325 MG PO TABS: 1.0000 | ORAL_TABLET | Freq: Four times a day (QID) | ORAL | Status: DC | PRN

## 2016-01-05 NOTE — Telephone Encounter (Signed)
A refill for hydrocodone was written.

## 2016-01-05 NOTE — Telephone Encounter (Signed)
Patient is calling for a written Rx hydrocodone-acetaminophen 5-325 mg tablets.  Thanks!

## 2016-01-06 ENCOUNTER — Ambulatory Visit
Admission: RE | Admit: 2016-01-06 | Discharge: 2016-01-06 | Disposition: A | Discharge status: Home / Self Care | Payer: Medicare Other | Source: Ambulatory Visit | Attending: Thoracic Surgery (Cardiothoracic Vascular Surgery) | Admitting: Thoracic Surgery (Cardiothoracic Vascular Surgery)

## 2016-01-06 ENCOUNTER — Ambulatory Visit (INDEPENDENT_AMBULATORY_CARE_PROVIDER_SITE_OTHER): Payer: Medicare Other | Admitting: Thoracic Surgery (Cardiothoracic Vascular Surgery)

## 2016-01-06 ENCOUNTER — Encounter: Payer: Self-pay | Admitting: Thoracic Surgery (Cardiothoracic Vascular Surgery)

## 2016-01-06 ENCOUNTER — Other Ambulatory Visit: Payer: Self-pay | Admitting: *Deleted

## 2016-01-06 VITALS — BP 116/68 | HR 70 | Resp 18 | Ht 67.0 in | Wt 117.0 lb

## 2016-01-06 DIAGNOSIS — J90 Pleural effusion, not elsewhere classified: Secondary | ICD-10-CM

## 2016-01-06 MED ORDER — PREDNISONE 5 MG PO TABS: 5.0000 mg | ORAL_TABLET | Freq: Every day | ORAL | Status: DC

## 2016-01-06 NOTE — Telephone Encounter (Signed)
Placed up front for pickup ° °

## 2016-01-06 NOTE — Progress Notes (Signed)
[address]       [place] [address]             [phone]       HPI: Erin [name] turns today for follow-up of her right pleural effusion.  Erin [name] is an 82 year old woman with multiple severe medical problems including chronic atrial fibrillation, congestive heart failure, tricuspid regurgitation, previous stroke, hypertension, sleep apnea, severe kyphoscoliosis, tremor, generalized muscle weakness, focal seizures, and senile dementia.  I saw her in the fall of 2016 for bilateral pleural effusions. These were relatively small and did not worsen after 6 week follow-up. No intervention was needed.  She recently was treated for pneumonia. She was found to have a right pleural effusion. She is a poor candidate for any type of intervention due to her general condition. We decided to try a short course of prednisone 20 mg daily to see if there was no inflammatory component that might improve.  She says that her breathing feels worse. Her son noticed an episode earlier this morning where she really was struggling to breathe with getting to the bathroom. She also complains of right flank pain radiates into her groin and continuous twitching movements in her left leg.  Past Medical History  Diagnosis Date  . Anxiety   . Arthritis   . Hyperlipidemia   . Essential hypertension, benign   . Osteoporosis   . GERD (gastroesophageal reflux disease)   . Hearing loss   . Restless leg   . Tremor   . Leg edema   . Obese   . Gall bladder disease   . Cataract   . Carpal tunnel syndrome of left wrist   . Atrial fibrillation ([place])   . Sleep apnea   . Esophageal dilatation   . Frequent falls   . History of nuclear stress test 08/31/2012    Lexiscan cardiolite negative for ischemia  . Polyneuropathy in other diseases classified elsewhere ([place]) 10/03/2013  . Cardioembolic stroke ([place]) [id number]  . UTI (lower urinary tract infection)   . History of stroke with current  residual effects 11/20/2014  . Seizures ([place])   . 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 [place], post-stroke 04/04/2015     Hemiparesis, nondominant hemisphere  . Focal seizures ([place]) 10/08/2015     Current Outpatient Prescriptions  Medication Sig Dispense Refill  . acetaminophen (TYLENOL) 500 MG tablet Take 1,000 mg by mouth every 6 (six) hours as needed for mild pain or moderate pain.    [name] ALPRAZolam (XANAX) 0.5 MG tablet TAKE ONE & ONE-HALF TABLETS BY MOUTH ONCE DAILY AT BEDTIME AS NEEDED FOR ANXIETY 50 tablet 3  . amLODipine (NORVASC) 10 MG tablet Take 1 tablet (10 mg total) by mouth daily. 30 tablet 3  . apixaban (ELIQUIS) 5 MG TABS tablet Take 1 tablet (5 mg total) by mouth 2 (two) times daily. 60 tablet 6  . atorvastatin (LIPITOR) 20 MG tablet TAKE ONE TABLET BY MOUTH ONCE DAILY 30 tablet 3  . busPIRone (BUSPAR) 5 MG tablet i tablet three times daily (Patient taking differently: Take 5 mg by mouth 3 (three) times daily. i tablet three times daily) 90 tablet 3  . donepezil (ARICEPT) 10 MG tablet TAKE ONE TABLET BY MOUTH ONCE DAILY AT BEDTIME 30 tablet 4  . doxylamine, Sleep, (UNISOM) 25 MG tablet Take 25 mg by mouth at bedtime. Reported on 12/26/2015    . Fluoxetine HCl, PMDD,  20 MG TABS One tablet once daily 30 tablet 2  . gabapentin (NEURONTIN) 100 MG capsule TAKE ONE CAPSULE BY MOUTH IN THE MORNING AND TWO CAPSULES AT MIDDAY, THEN THREE CAPSULES IN THE EVENING 180 capsule 5  . HYDROcodone-acetaminophen (NORCO/VICODIN) 5-325 MG tablet Take 1 tablet by mouth every 6 (six) hours as needed for moderate pain. 60 tablet 0  . levETIRAcetam (KEPPRA) 750 MG tablet TAKE ONE TABLET BY MOUTH TWICE DAILY 60 tablet 11  . metoprolol (LOPRESSOR) 50 MG tablet TAKE ONE-HALF TABLET BY MOUTH THREE TIMES DAILY 45 tablet 3  . mirtazapine (REMERON) 30 MG tablet Take 1 tablet (30 mg total) by mouth at bedtime. 30 tablet 5  . montelukast (SINGULAIR) 10 MG tablet TAKE ONE TABLET  BY MOUTH AT BEDTIME 30 tablet 3  . mupirocin ointment (BACTROBAN) 2 % Place 1 application into the nose 2 (two) times daily. 22 g 0  . pantoprazole (PROTONIX) 40 MG tablet Take 1 tablet (40 mg total) by mouth daily before breakfast. 60 tablet 1  . polyethylene glycol (MIRALAX / GLYCOLAX) packet Take 17 g by mouth daily. (Patient taking differently: Take 17 g by mouth daily as needed for mild constipation. ) 14 each 0  . predniSONE (DELTASONE) 20 MG tablet Take 1 tablet (20 mg total) by mouth daily with breakfast. 21 tablet 0  . Vitamin D, Ergocalciferol, (DRISDOL) 50000 units CAPS capsule TAKE 1 CAPSULE BY MOUTH EVERY 7 DAYS. 4 capsule 5  . predniSONE (DELTASONE) 5 MG tablet Take 1 tablet (5 mg total) by mouth daily with breakfast. 14 tablet 0   No current facility-administered medications for this visit.    Physical Exam BP 116/68 mmHg  Pulse 70  Resp 18  Ht 5\' 7"  (1.702 m)  Wt 117 lb (53.071 kg)  BMI 18.32 kg/m2  SpO2 95% Elderly debilitated chronically ill-appearing woman in no acute distress Pleasant and oriented Severe kyphoscoliosis Tremor Diminished breath sounds bilaterally, greater at right base  Diagnostic Tests: CHEST 2 VIEW  COMPARISON: Chest x-ray of Dec 04, 2015  FINDINGS: The lungs are better inflated today. There remains a small right pleural effusion which is less conspicuous than on the previous study. There is persistent increased interstitial density in the right upper lobe. There is stable right hilar prominence. There is stable cardiomegaly without pulmonary vascular congestion. There is calcification in the wall of the aortic arch. There is severe thoracic kyphosis.  IMPRESSION: 1. Slight interval improvement in the small right pleural effusion. Persistent interstitial density in the right upper lobe which may reflect residual infiltrate or less likely scarring. 2. Aortic atherosclerosis. 3. Cardiomegaly without pulmonary  edema.   Electronically Signed  By: David Martinique M.D.  On: 01/06/2016 15:43  I personally reviewed the chest x-ray and concur with the findings as noted above.  Impression: 82 year old woman with a multitude of medical problems and a persistent right pleural effusion. This did not respond significantly to prednisone. The radiologist felt the effusion was smaller, but its very difficult to tell given differences in angulation due to her severe kyphoscoliosis. She does not think she is better symptomatically.  It is still not clear exactly what the etiology of the effusion is. Given that she did not respond to an empiric trial of prednisone I think the next step would be to do a thoracentesis. We will have radiology do that with ultrasound guidance. She will need to hold her [place] was for 48 hours prior to the thoracentesis. We will send the fluid  for protein, glucose, pH, LDH, cell count with differential, and cytology.  I'll plan to see her back about 3 weeks after the thoracentesis with a repeat chest x-ray to assess how rapidly the effusion recurs.  Plan: Ultrasound-guided right thoracentesis  Follow-up in 3 weeks with PA and lateral chest x-ray  Melrose Nakayama, MD [facility] [phone]

## 2016-01-08 IMAGING — CT CT RENAL STONE PROTOCOL
2 of 4 series · 16 of 46 positions shown, 18 images · non-contrast
Comparison: CT scan of June 17, 2013.

CLINICAL DATA: Gross hematuria.

EXAM:
CT ABDOMEN AND PELVIS WITHOUT CONTRAST
TECHNIQUE: Multidetector CT imaging of the abdomen and pelvis was performed
following the standard protocol without IV contrast.

[Series 3: standard/full over (age)lbs 5.0 · axial · 0.69mm/px · z∈[-230,+145]mm · 13 of 83 slices shown, 15 images]
[im 4/83  soft-tissue]
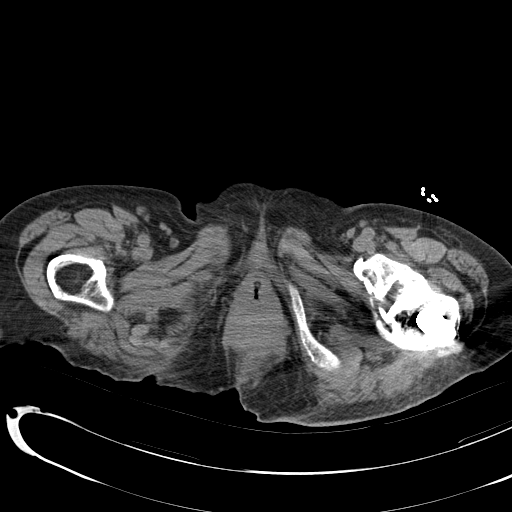
[im 4/83  bone]
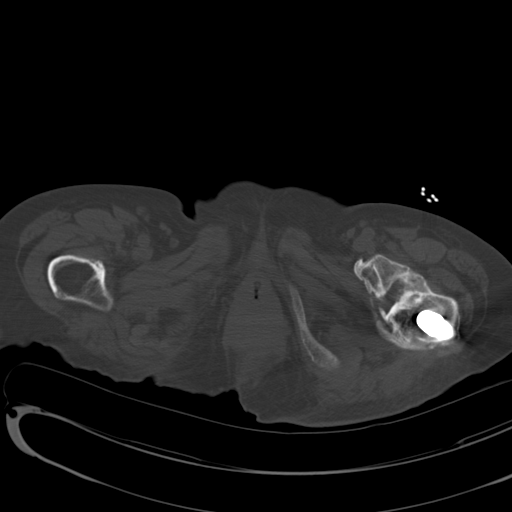
[im 10/83  soft-tissue]
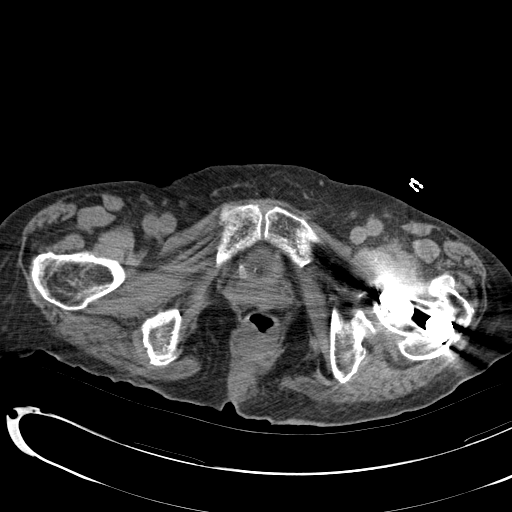
[im 16/83  soft-tissue]
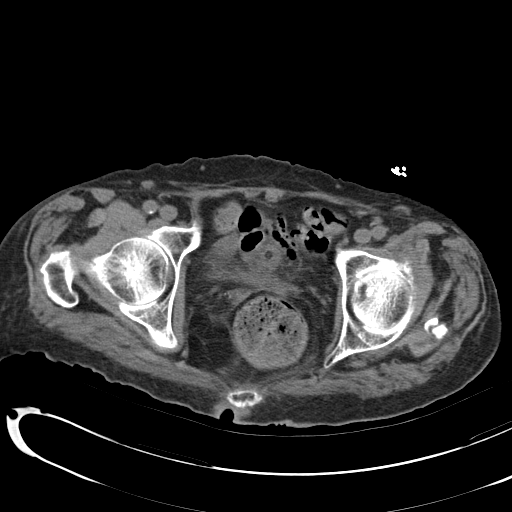
[im 23/83  soft-tissue]
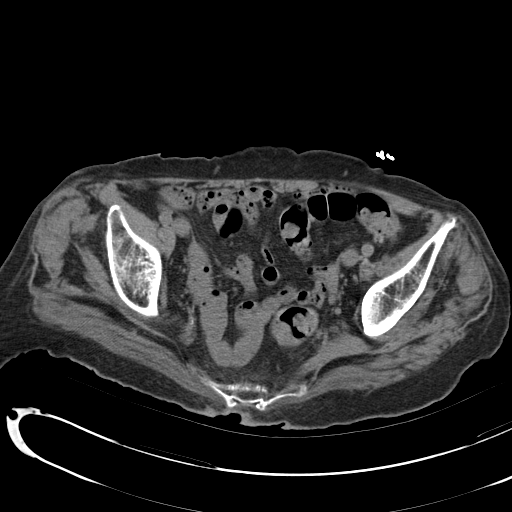
[im 29/83  soft-tissue]
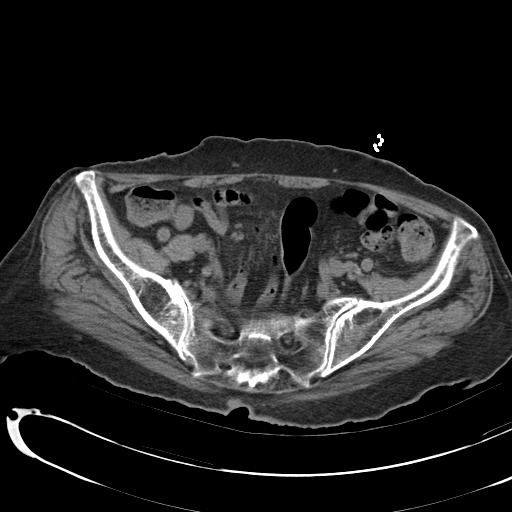
[im 35/83  soft-tissue]
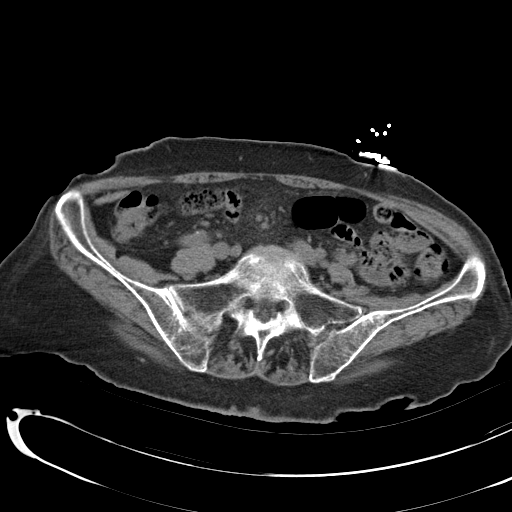
[im 42/83  soft-tissue]
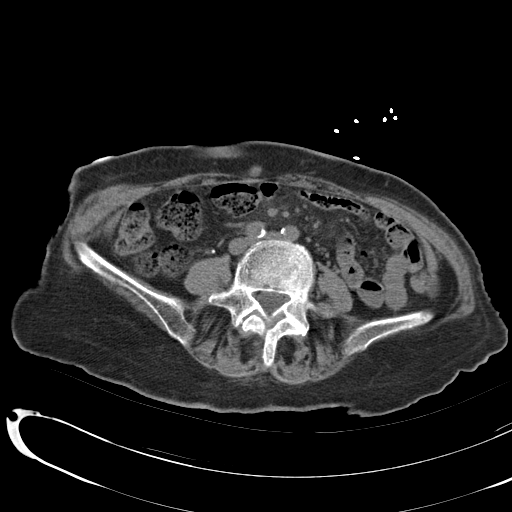
[im 48/83  soft-tissue]
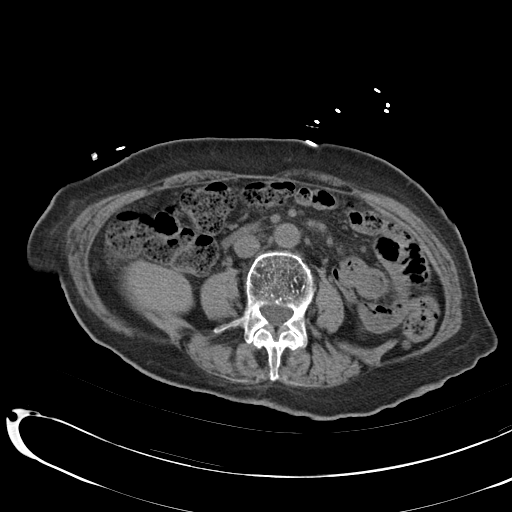
[im 54/83  soft-tissue]
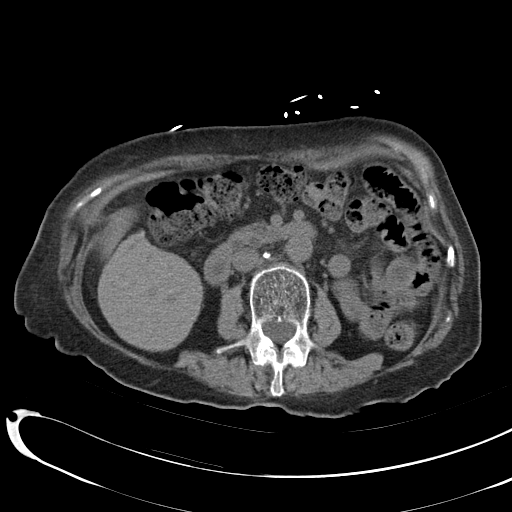
[im 54/83  bone]
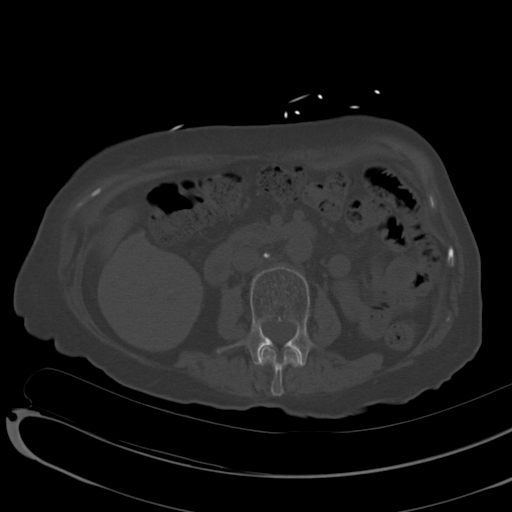
[im 60/83  soft-tissue]
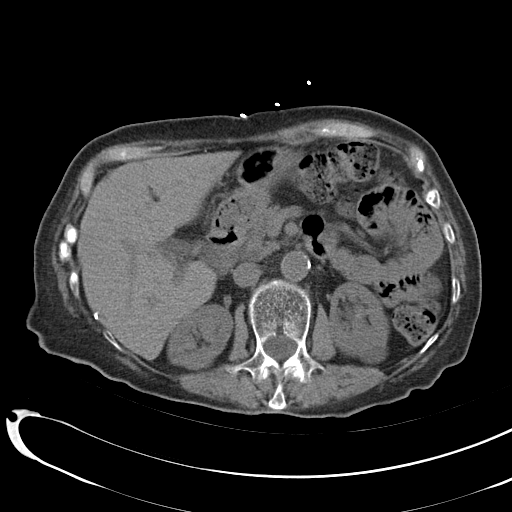
[im 67/83  soft-tissue]
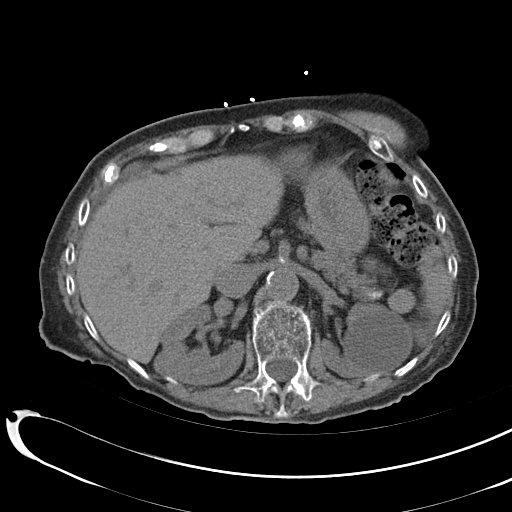
[im 73/83  soft-tissue]
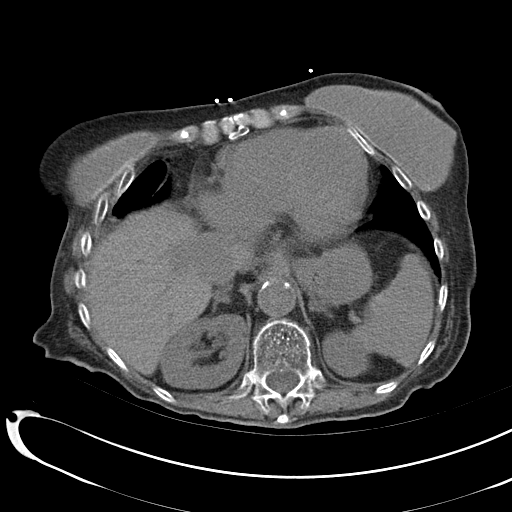
[im 79/83  soft-tissue]
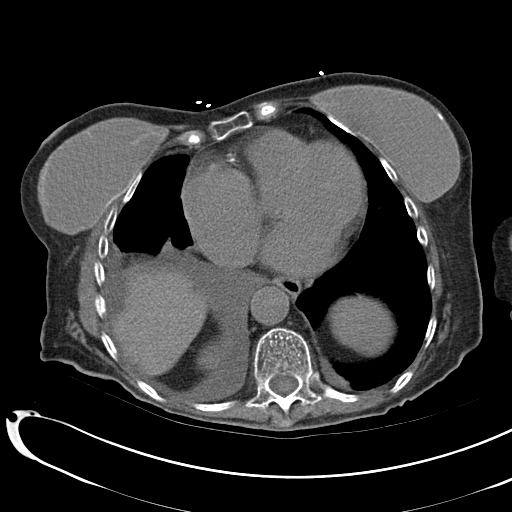

[Series 5: mpr coronal · coronal · 0.65mm/px · 3 of 79 slices shown]
[im 27/79  soft-tissue]
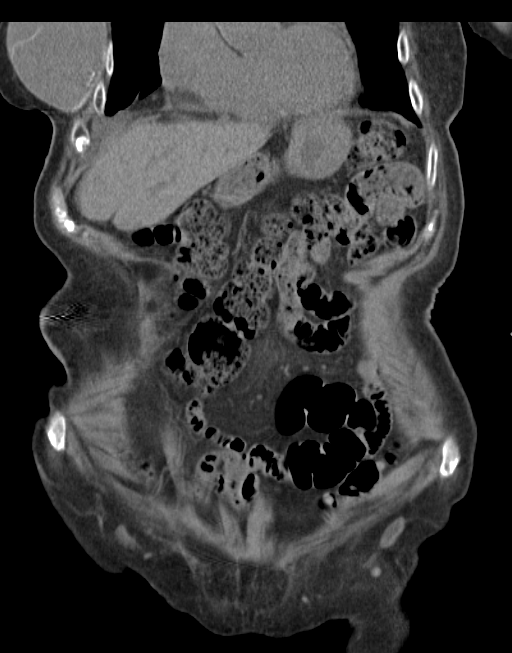
[im 35/79  soft-tissue]
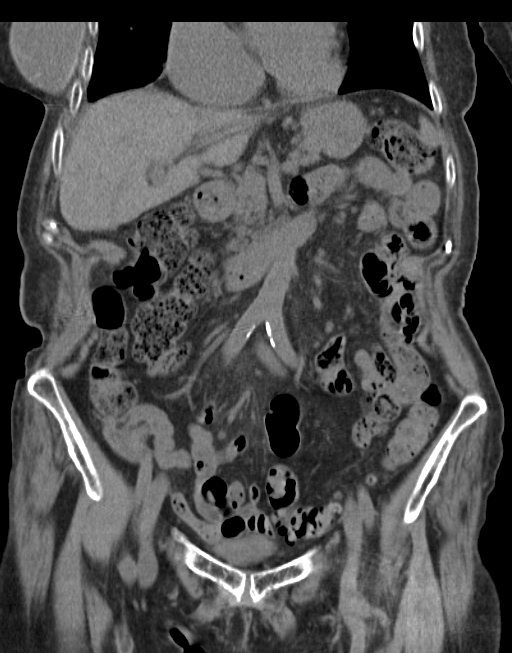
[im 44/79  soft-tissue]
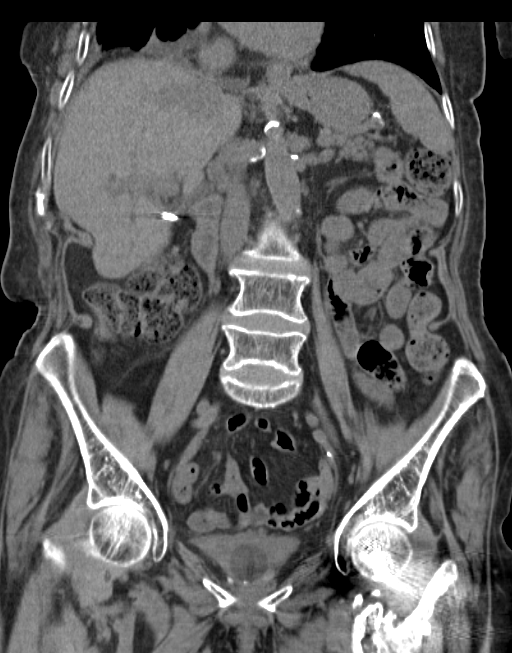

[16 of 46 positions shown; findings below may reference images not displayed]

FINDINGS: Mild right pleural effusion is noted with adjacent subsegmental
atelectasis or possibly pneumonia. Left lung base is clear. Status
post surgical pinning of left proximal femur.

Status post cholecystectomy. No focal abnormality is noted in the
liver, spleen or pancreas on these unenhanced images. Adrenal glands
appear normal. Bilateral nonobstructive nephrolithiasis is noted.
Stable large left renal cyst is noted. No ureteral calculi are
noted. Atherosclerosis of abdominal aorta is noted without aneurysm
formation. Urinary bladder is decompressed secondary to Foley
catheter. There is no evidence of bowel obstruction. No abnormal
fluid collection is noted. Status post hysterectomy. No significant
adenopathy is noted. Sigmoid diverticulosis is noted without
inflammation.
IMPRESSION: Mild right pleural effusion is noted with adjacent subsegmental
atelectasis or possibly pneumonia.

Bilateral nonobstructive nephrolithiasis is noted. No hydronephrosis
or renal obstruction is noted.

Nurse gross is of abdominal aorta is noted without aneurysm
formation.

Sigmoid diverticulosis is noted without inflammation.

## 2016-01-09 ENCOUNTER — Other Ambulatory Visit: Payer: Self-pay | Admitting: Thoracic Surgery (Cardiothoracic Vascular Surgery)

## 2016-01-09 ENCOUNTER — Telehealth: Payer: Self-pay | Admitting: *Deleted

## 2016-01-09 ENCOUNTER — Ambulatory Visit (HOSPITAL_COMMUNITY)
Admission: RE | Admit: 2016-01-09 | Discharge: 2016-01-09 | Disposition: A | Discharge status: Home / Self Care | Payer: Medicare Other | Source: Ambulatory Visit | Attending: Thoracic Surgery (Cardiothoracic Vascular Surgery) | Admitting: Thoracic Surgery (Cardiothoracic Vascular Surgery)

## 2016-01-09 DIAGNOSIS — J948 Other specified pleural conditions: Secondary | ICD-10-CM | POA: Insufficient documentation

## 2016-01-09 DIAGNOSIS — J90 Pleural effusion, not elsewhere classified: Secondary | ICD-10-CM | POA: Insufficient documentation

## 2016-01-09 NOTE — Telephone Encounter (Signed)
Got a call from Radiology this morning stating this patients effusion was small so he did not do thoracenthesis.  Information given to Dr. Roxan Hockey so he is aware.  He wants her to continue to follow-up with her PCP, [name].  Patient and her family understand and have no further questions.  If we can do anything in the future, please call back.

## 2016-01-12 ENCOUNTER — Other Ambulatory Visit: Payer: Self-pay | Admitting: *Deleted

## 2016-01-12 NOTE — Patient Outreach (Addendum)
[facility]) Care Management  01/12/2016  Jurlene [facility] 08-Mar-1930 [id number]  Voice message received from Durwin Reges, grand-son and primary caregiver of Mrs. Darnold, left over the weekend. I returned a call to [name] today. He reported that Mrs. Romas awakened with acute shortness of breath yesterday. [name] said Mrs. Haslem reported "feeling as bad as I did when I had my stroke". [name] denies that Mrs. Bouska is more swollen or having cough/congestion but states that she "peed and peed probably 15 times yesterday". [name] says Mrs. Standen is taking all medications, including diuretic, as prescribed. He relates that she seems to feel better today and rested well last night.   Plan: [name] and/or other family/caregivers will call if Mrs. Opdahl has recurrent/new/worsened symptoms. I will reach out to Mrs. Naser again by phone this week. I have scheduled a home visit with Mrs. Strothman for next week.    [place] Management  [phone]

## 2016-01-16 ENCOUNTER — Other Ambulatory Visit (HOSPITAL_COMMUNITY)
Admission: RE | Admit: 2016-01-16 | Discharge: 2016-01-16 | Disposition: A | Discharge status: Home / Self Care | Payer: Medicare Other | Source: Other Acute Inpatient Hospital | Attending: Urology | Admitting: Urology

## 2016-01-16 DIAGNOSIS — N3020 Other chronic cystitis without hematuria: Secondary | ICD-10-CM | POA: Insufficient documentation

## 2016-01-16 LAB — URINALYSIS, ROUTINE W REFLEX MICROSCOPIC
BILIRUBIN URINE: NEGATIVE
GLUCOSE, UA: NEGATIVE mg/dL
Ketones, ur: NEGATIVE mg/dL
Nitrite: NEGATIVE
PROTEIN: 30 mg/dL — AB
SPECIFIC GRAVITY, URINE: 1.02 (ref 1.005–1.030)
pH: 6.5 (ref 5.0–8.0)

## 2016-01-16 LAB — URINE MICROSCOPIC-ADD ON

## 2016-01-19 ENCOUNTER — Other Ambulatory Visit: Payer: Self-pay | Admitting: Licensed Clinical Social Worker

## 2016-01-19 NOTE — Patient Outreach (Signed)
Assessment:  CSW spoke via phone with Erin [name], grandson of client, on 01/19/16. CSW verified identity of Erin [name]. Erin [name] and CSW spoke of client needs.  Client is eating small meals with assistance. She needs assistance with short distance ambulation She is using a walker to help her with ambulation (with assistance as needed). She has strong family support daily for activities of daily living.  She has prescribed medications and is taking medications as prescribed. She sees [name] as scheduled as her primary care doctor.  Client saw [name] in June of 2017 for a medical appointment. Client saw cardiologist also for appointment in June of 2017. Client is sleeping well. Erin [name] reported that a home health aide continues to assist client with activities of daily living as scheduled each week (home health aide is assisting client in the home as scheduled, for 3 days weekly).  Client is using oxygen in the home as prescribed to help client with breathing. Client is sleeping well.  Erin [name] said that client does fatigue easily.  Client also becomes short of breath occasionally.  Client continues to have [place] with RN Erin [name].  CSW reminded Erin [name] of [facility] support in nursing, social work and pharmacy programs.  Erin [name] said that client and her family continue to desire for client to remain in home environment with needed supports in place.  CSW encouraged that client continue to attend scheduled client medical appointments in next 30 days.  CSW invited Erin [name], Erin [name], or client to call CSW at [phone] as needed to address social work needs of client.  CSW thanked Erin [name] for conversation with CSW on 01/19/16.  Plan:  Client to attend all scheduled client medical appointments in next 30 days with transport assistance arranged as needed for client. CSW to collaborate with RN Erin [name] in monitoring needs of client. CSW to call client in 4 weeks to assess client needs at that  time.  Erin [name].Erin [name] MSW, LCSW Licensed Clinical Social Worker [facility] Care Management [phone]

## 2016-01-22 ENCOUNTER — Other Ambulatory Visit: Payer: Self-pay | Admitting: Family Medicine

## 2016-01-22 ENCOUNTER — Ambulatory Visit: Payer: Self-pay | Admitting: *Deleted

## 2016-01-22 ENCOUNTER — Other Ambulatory Visit: Payer: Self-pay

## 2016-01-22 ENCOUNTER — Ambulatory Visit: Payer: Self-pay | Admitting: Family Medicine

## 2016-01-22 ENCOUNTER — Encounter: Payer: Self-pay | Admitting: *Deleted

## 2016-01-22 ENCOUNTER — Other Ambulatory Visit: Payer: Self-pay | Admitting: *Deleted

## 2016-01-22 MED ORDER — SILVER SULFADIAZINE 1 % EX CREA: 1.0000 "application " | TOPICAL_CREAM | Freq: Every day | CUTANEOUS | Status: DC
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
## 2016-01-23 ENCOUNTER — Other Ambulatory Visit: Payer: Self-pay | Admitting: *Deleted

## 2016-01-27 ENCOUNTER — Encounter: Payer: Self-pay | Admitting: *Deleted

## 2016-01-27 ENCOUNTER — Other Ambulatory Visit: Payer: Self-pay | Admitting: Family Medicine

## 2016-01-27 ENCOUNTER — Other Ambulatory Visit: Payer: Self-pay | Admitting: *Deleted

## 2016-01-27 ENCOUNTER — Encounter: Payer: Self-pay | Admitting: Thoracic Surgery (Cardiothoracic Vascular Surgery)

## 2016-01-27 NOTE — Patient Outreach (Signed)
[facility]) Care Management  01/27/2016  Jacki Lebourgeois [facility] 1930/05/22 [id number]  Telephone outreach today to Mrs. Lybarger and her grandson [name] to check on progress with last week's reported cough/congestion and to follow up on right lateral leg skin condition. Mrs. Fialkowski's congestion is improved and she is sleeping well at night. [name] says the leg is unchanged and definitely not worsened. He is applying Silvadene cream as prescribed.   Plan: I will see Mrs. Bagdasarian at home for routine home visit on 02/10/16 at [place] Management  [phone]

## 2016-02-03 ENCOUNTER — Encounter: Payer: Self-pay | Admitting: *Deleted

## 2016-02-03 ENCOUNTER — Other Ambulatory Visit: Payer: Self-pay | Admitting: *Deleted

## 2016-02-03 NOTE — Patient Outreach (Addendum)
[place] [facility]) Care Management   02/03/2016  Erin [name] 05-19-1930 [id number]  Erin [name] is an 82 y.o. female from whose grandson I received a call asking if I could see Erin [name] today to evaluate a right lower lateral leg wound about which Erin [name]  Had concerns.   Subjective: "I just want you to look at it and make sure its not cancer."  Objective: right lower lateral extremity with ulceration 1.5cm x 1.75cm (see image)  Review of Systems  Eyes: Negative.   Cardiovascular: Positive for leg swelling.       Mild edema from ankle to pretibial area right leg  Musculoskeletal: Positive for back pain and myalgias. Negative for falls.  Neurological:       Report of burning in both legs/feet, worsened at area of skin wound over the last week    Physical Exam  Constitutional: She is oriented to person, place, and time. She appears lethargic. She has a sickly appearance.  Neurological: She is oriented to person, place, and time. She appears lethargic.  Skin: Lesion noted.     Psychiatric: She has a normal mood and affect. Her behavior is normal. Judgment and thought content normal.    Encounter Medications:   Outpatient Encounter Prescriptions as of 02/03/2016  Medication Sig  . acetaminophen (TYLENOL) 500 MG tablet Take 1,000 mg by mouth every 6 (six) hours as needed for mild pain or moderate pain.  [name] ALPRAZolam (XANAX) 0.5 MG tablet TAKE ONE & ONE-HALF TABLETS BY MOUTH ONCE DAILY AT BEDTIME AS NEEDED FOR ANXIETY  . amLODipine (NORVASC) 10 MG tablet Take 1 tablet (10 mg total) by mouth daily.  [name] apixaban (ELIQUIS) 5 MG TABS tablet Take 1 tablet (5 mg total) by mouth 2 (two) times daily.  [name] atorvastatin (LIPITOR) 20 MG tablet TAKE ONE TABLET BY MOUTH ONCE DAILY  . busPIRone (BUSPAR) 5 MG tablet i tablet three times daily (Patient taking differently: Take 5 mg by mouth 3 (three) times daily. i tablet three times daily)  . donepezil (ARICEPT) 10 MG tablet  TAKE ONE TABLET BY MOUTH ONCE DAILY AT BEDTIME  . doxylamine, Sleep, (UNISOM) 25 MG tablet Take 25 mg by mouth at bedtime. Reported on 12/26/2015  . Fluoxetine HCl, PMDD, 20 MG TABS One tablet once daily  . gabapentin (NEURONTIN) 100 MG capsule TAKE ONE CAPSULE BY MOUTH IN THE MORNING AND TWO CAPSULES AT MIDDAY, THEN THREE CAPSULES IN THE EVENING  . HYDROcodone-acetaminophen (NORCO/VICODIN) 5-325 MG tablet Take 1 tablet by mouth every 6 (six) hours as needed for moderate pain.  [name] levETIRAcetam (KEPPRA) 750 MG tablet TAKE ONE TABLET BY MOUTH TWICE DAILY  . metoprolol (LOPRESSOR) 50 MG tablet TAKE ONE-HALF TABLET BY MOUTH THREE TIMES DAILY  . mirtazapine (REMERON) 30 MG tablet Take 1 tablet (30 mg total) by mouth at bedtime.  . montelukast (SINGULAIR) 10 MG tablet TAKE ONE TABLET BY MOUTH AT BEDTIME  . mupirocin ointment (BACTROBAN) 2 % Place 1 application into the nose 2 (two) times daily.  . pantoprazole (PROTONIX) 40 MG tablet Take 1 tablet (40 mg total) by mouth daily before breakfast.  . polyethylene glycol (MIRALAX / GLYCOLAX) packet Take 17 g by mouth daily. (Patient taking differently: Take 17 g by mouth daily as needed for mild constipation. )  . predniSONE (DELTASONE) 20 MG tablet Take 1 tablet (20 mg total) by mouth daily with breakfast.  . predniSONE (DELTASONE) 5 MG tablet Take 1 tablet (5 mg total) by  mouth daily with breakfast.  . silver sulfADIAZINE (SILVADENE) 1 % cream Apply 1 application topically daily.  . Vitamin D, Ergocalciferol, (DRISDOL) 50000 units CAPS capsule TAKE ONE CAPSULE BY MOUTH ONCE A WEEK (EVERY 7 DAYS)   Fall/Depression Screening:    PHQ 2/9 Scores 01/19/2016 12/23/2015 12/19/2015 11/19/2015 10/14/2015 08/06/2015 07/18/2015  PHQ - 2 Score 2 2 2 2 3 3 3   PHQ- 9 Score 6 6 12 12 13 10 10     Assessment:  82 year old female patient with multiple medical problems who lives at home with her family. She is bed/chair bound and has had skin ulcerations of the feet over the last  year that have been responsive to use of Silvadene cream and pressure relief measures. Today, Erin [name] complains of worsened discomfort and burning in particular of the right lower extremity lateral wound. She had her grandson stop applying the prescribed Silvadene cream and begin applying Neosporin. I advised today that she resume application of the prescribed cream and told her I would notify [name] of her concerns and provide an update on her condition.   Image Right Lower Lateral Leg:      Plan: Follow up with Dr. Tula Nakayama, primary care provider today. I will follow up with Erin [name] by phone if there are any changes to the plan of care. I will see Erin [name] at home next week for a face to face visit which was already scheduled.    [place] Management  [phone]

## 2016-02-06 ENCOUNTER — Other Ambulatory Visit: Payer: Self-pay | Admitting: *Deleted

## 2016-02-06 NOTE — Patient Outreach (Signed)
[facility] [facility]) Care Management  02/06/2016  Erin [name] [facility] [birth year] [id number]  Call received from Erin [name]'s son Erin [name] who reports that Erin [name] continues to have unrelenting although intermittent cough and is also concerned that the wound on her foot is burning more and appears to be getting larger. Erin [name] stated that the family is applying Silvadene cream to the foot twice daily as prescribed and is keeping the foot off surfaces.   Plan: I reached out to Dr. Griffin Dakin office re: these concerns and requested consideration of office visit next week.    [place] Management  [phone]

## 2016-02-09 ENCOUNTER — Other Ambulatory Visit: Payer: Self-pay | Admitting: Neurology

## 2016-02-09 DIAGNOSIS — G4734 Idiopathic sleep related nonobstructive alveolar hypoventilation: Secondary | ICD-10-CM | POA: Insufficient documentation

## 2016-02-09 MED ORDER — HYDROCODONE-ACETAMINOPHEN 5-325 MG PO TABS: 1.0000 | ORAL_TABLET | Freq: Four times a day (QID) | ORAL | 0 refills | Status: DC | PRN

## 2016-02-09 NOTE — Telephone Encounter (Signed)
Patient's grandson is calling to order a written Rx hydrocodone-acetaminophen 5-325 mg tablets.

## 2016-02-09 NOTE — Assessment & Plan Note (Signed)
Nocturnal hypoxemia is established using overnight pulse oximetry testing. She also has a pleural effusion which contributes to nightime desaturations. She qualifies for, and does need  needs supplemental oxygen at night/ when asleep because of these stated health conditions

## 2016-02-09 NOTE — Telephone Encounter (Signed)
OV 10/08/15 FU 04/15/16 RF 01/05/16

## 2016-02-10 ENCOUNTER — Encounter: Payer: Self-pay | Admitting: *Deleted

## 2016-02-10 ENCOUNTER — Ambulatory Visit: Payer: Self-pay | Admitting: *Deleted

## 2016-02-10 ENCOUNTER — Other Ambulatory Visit: Payer: Self-pay | Admitting: *Deleted
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
## 2016-02-10 NOTE — Telephone Encounter (Signed)
Rx printed, signed, up front for pick-up. 

## 2016-02-13 ENCOUNTER — Other Ambulatory Visit: Payer: Self-pay | Admitting: *Deleted

## 2016-02-13 NOTE — Patient Outreach (Signed)
[facility]) Care Management  02/13/2016  Alaniz Skidmore [facility] Aug 11, 1929 [id number]  I received a response from [name] regarding treatment for Erin [name]'s foot wound and called Erin [name] (son of Erin [name]) and Erin [name] (grand-son and primary hands on caregiver of Erin [name]) to forward the information but was unable to reach them. I left a HIPPA compliant voice message requesting a return call.   [name] wishes to refer Erin [name] either to the [facility] or one of the [facility] for further follow up and care of her right foot wound. Additionally, [name] would like to have Erin [name] take one extra strength tylenol at lunch time to help with the discomfort associated with the wound.   Plan: I will reach out to Erin [name] and/or her family throughout the day today to try and relay the needed information to them.    [place] Management  [phone]

## 2016-02-17 ENCOUNTER — Telehealth: Payer: Self-pay | Admitting: Family Medicine

## 2016-02-17 ENCOUNTER — Other Ambulatory Visit: Payer: Self-pay | Admitting: Family Medicine

## 2016-02-17 DIAGNOSIS — L97909 Non-pressure chronic ulcer of unspecified part of unspecified lower leg with unspecified severity: Principal | ICD-10-CM

## 2016-02-17 DIAGNOSIS — I83009 Varicose veins of unspecified lower extremity with ulcer of unspecified site: Secondary | ICD-10-CM

## 2016-02-17 NOTE — Telephone Encounter (Signed)
I have entered a referral for pt to be seen at [facility] center in [place] re stasis ulcer on leg, pls follow through and let family know of appt, thanks

## 2016-02-20 ENCOUNTER — Other Ambulatory Visit: Payer: Self-pay | Admitting: Licensed Clinical Social Worker

## 2016-02-20 NOTE — Patient Outreach (Signed)
Assessment:  CSW spoke via phone with Durwin Reges, grandson of client. CSW verified identity of Samyrah Pelagio. [name] and CSW spoke of cient needs. Client is eating small meals with assistance. She needs assistance with ambulation. She is using a walker to help her ambulate. She has prescribed medications and is taking medications as prescribed. Client uses oxygen as prescribed to help her with breathing. [name] said that client fatigues easily. Client sees [name] as her primary car doctor. Client has an appointment scheduled with [name] on 03/02/16. Client also sees cardiologist as scheduled.  Client receives [place] support with RN [name]. [name] said that client and family desire for client to continue to reside at home with needed supports in place. [name] said that client receives in home support weekly with a home health aide as scheduled.  CSW encouraged that client attend all scheduled client medical appointments in the next 30 days. CSW encouraged that client or family representative call CSW at [phone] as needed to discuss social work needs of client.   Plan:  Client to attend all scheduled client medical appointments in next 30 days. CSW to call client/[name] Burdick in 4 weeks to assess client needs.  [name].Jo Booze MSW, LCSW Licensed Clinical Social Worker [facility] Care Management [phone]

## 2016-02-20 NOTE — Telephone Encounter (Signed)
Referral was sent to Dr. Nils Pyle at the wound center in [place]

## 2016-02-23 ENCOUNTER — Encounter: Payer: Self-pay | Admitting: *Deleted

## 2016-02-23 ENCOUNTER — Other Ambulatory Visit: Payer: Self-pay | Admitting: *Deleted
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
## 2016-02-24 ENCOUNTER — Other Ambulatory Visit: Payer: Self-pay

## 2016-02-24 MED ORDER — SILVER SULFADIAZINE 1 % EX CREA: 1.0000 "application " | TOPICAL_CREAM | Freq: Every day | CUTANEOUS | 1 refills | Status: AC

## 2016-02-27 ENCOUNTER — Other Ambulatory Visit: Payer: Self-pay | Admitting: Family Medicine

## 2016-03-02 ENCOUNTER — Emergency Department (HOSPITAL_COMMUNITY): Payer: Medicare Other

## 2016-03-02 ENCOUNTER — Ambulatory Visit (INDEPENDENT_AMBULATORY_CARE_PROVIDER_SITE_OTHER): Payer: Medicare Other | Admitting: Family Medicine

## 2016-03-02 ENCOUNTER — Inpatient Hospital Stay (HOSPITAL_COMMUNITY)
Admission: EM | Admit: 2016-03-02 | Discharge: 2016-03-05 | DRG: 292 | Disposition: A | Discharge status: Hospice | Payer: Medicare Other | Attending: Internal Medicine | Admitting: Internal Medicine

## 2016-03-02 ENCOUNTER — Other Ambulatory Visit: Payer: Self-pay | Admitting: *Deleted

## 2016-03-02 ENCOUNTER — Encounter: Payer: Self-pay | Admitting: Family Medicine

## 2016-03-02 ENCOUNTER — Encounter (HOSPITAL_COMMUNITY): Payer: Self-pay | Admitting: Emergency Medicine

## 2016-03-02 VITALS — BP 120/82 | HR 122 | Resp 18

## 2016-03-02 DIAGNOSIS — R0602 Shortness of breath: Secondary | ICD-10-CM | POA: Diagnosis present

## 2016-03-02 DIAGNOSIS — Z800 Family history of malignant neoplasm of digestive organs: Secondary | ICD-10-CM | POA: Diagnosis not present

## 2016-03-02 DIAGNOSIS — Z9013 Acquired absence of bilateral breasts and nipples: Secondary | ICD-10-CM

## 2016-03-02 DIAGNOSIS — H9190 Unspecified hearing loss, unspecified ear: Secondary | ICD-10-CM | POA: Diagnosis present

## 2016-03-02 DIAGNOSIS — G40909 Epilepsy, unspecified, not intractable, without status epilepticus: Secondary | ICD-10-CM | POA: Diagnosis present

## 2016-03-02 DIAGNOSIS — R0902 Hypoxemia: Secondary | ICD-10-CM | POA: Diagnosis present

## 2016-03-02 DIAGNOSIS — Z66 Do not resuscitate: Secondary | ICD-10-CM | POA: Diagnosis present

## 2016-03-02 DIAGNOSIS — F329 Major depressive disorder, single episode, unspecified: Secondary | ICD-10-CM | POA: Diagnosis present

## 2016-03-02 DIAGNOSIS — Z8249 Family history of ischemic heart disease and other diseases of the circulatory system: Secondary | ICD-10-CM

## 2016-03-02 DIAGNOSIS — L819 Disorder of pigmentation, unspecified: Secondary | ICD-10-CM

## 2016-03-02 DIAGNOSIS — M810 Age-related osteoporosis without current pathological fracture: Secondary | ICD-10-CM | POA: Diagnosis present

## 2016-03-02 DIAGNOSIS — Z515 Encounter for palliative care: Secondary | ICD-10-CM

## 2016-03-02 DIAGNOSIS — Z7401 Bed confinement status: Secondary | ICD-10-CM

## 2016-03-02 DIAGNOSIS — Z9981 Dependence on supplemental oxygen: Secondary | ICD-10-CM

## 2016-03-02 DIAGNOSIS — Z833 Family history of diabetes mellitus: Secondary | ICD-10-CM

## 2016-03-02 DIAGNOSIS — I110 Hypertensive heart disease with heart failure: Principal | ICD-10-CM | POA: Diagnosis present

## 2016-03-02 DIAGNOSIS — Z823 Family history of stroke: Secondary | ICD-10-CM

## 2016-03-02 DIAGNOSIS — E785 Hyperlipidemia, unspecified: Secondary | ICD-10-CM | POA: Diagnosis present

## 2016-03-02 DIAGNOSIS — G8190 Hemiplegia, unspecified affecting unspecified side: Secondary | ICD-10-CM | POA: Diagnosis present

## 2016-03-02 DIAGNOSIS — Z820 Family history of epilepsy and other diseases of the nervous system: Secondary | ICD-10-CM | POA: Diagnosis not present

## 2016-03-02 DIAGNOSIS — Z825 Family history of asthma and other chronic lower respiratory diseases: Secondary | ICD-10-CM

## 2016-03-02 DIAGNOSIS — Z7189 Other specified counseling: Secondary | ICD-10-CM

## 2016-03-02 DIAGNOSIS — R0789 Other chest pain: Secondary | ICD-10-CM | POA: Diagnosis not present

## 2016-03-02 DIAGNOSIS — R296 Repeated falls: Secondary | ICD-10-CM | POA: Diagnosis present

## 2016-03-02 DIAGNOSIS — L8990 Pressure ulcer of unspecified site, unspecified stage: Secondary | ICD-10-CM | POA: Diagnosis present

## 2016-03-02 DIAGNOSIS — M4806 Spinal stenosis, lumbar region: Secondary | ICD-10-CM | POA: Diagnosis present

## 2016-03-02 DIAGNOSIS — F0390 Unspecified dementia without behavioral disturbance: Secondary | ICD-10-CM | POA: Diagnosis present

## 2016-03-02 DIAGNOSIS — I482 Chronic atrial fibrillation, unspecified: Secondary | ICD-10-CM | POA: Diagnosis present

## 2016-03-02 DIAGNOSIS — R079 Chest pain, unspecified: Secondary | ICD-10-CM

## 2016-03-02 DIAGNOSIS — R5383 Other fatigue: Secondary | ICD-10-CM | POA: Diagnosis not present

## 2016-03-02 DIAGNOSIS — J90 Pleural effusion, not elsewhere classified: Secondary | ICD-10-CM | POA: Diagnosis not present

## 2016-03-02 DIAGNOSIS — G2581 Restless legs syndrome: Secondary | ICD-10-CM | POA: Diagnosis present

## 2016-03-02 DIAGNOSIS — Z993 Dependence on wheelchair: Secondary | ICD-10-CM

## 2016-03-02 DIAGNOSIS — K219 Gastro-esophageal reflux disease without esophagitis: Secondary | ICD-10-CM | POA: Diagnosis present

## 2016-03-02 DIAGNOSIS — G4730 Sleep apnea, unspecified: Secondary | ICD-10-CM | POA: Diagnosis present

## 2016-03-02 DIAGNOSIS — I5033 Acute on chronic diastolic (congestive) heart failure: Secondary | ICD-10-CM | POA: Diagnosis present

## 2016-03-02 DIAGNOSIS — Z7901 Long term (current) use of anticoagulants: Secondary | ICD-10-CM

## 2016-03-02 DIAGNOSIS — M48061 Spinal stenosis, lumbar region without neurogenic claudication: Secondary | ICD-10-CM | POA: Diagnosis present

## 2016-03-02 LAB — COMPREHENSIVE METABOLIC PANEL
ALBUMIN: 3.1 g/dL — AB (ref 3.5–5.0)
ALT: 6 U/L — ABNORMAL LOW (ref 14–54)
ANION GAP: 3 — AB (ref 5–15)
AST: 18 U/L (ref 15–41)
Alkaline Phosphatase: 79 U/L (ref 38–126)
BUN: 12 mg/dL (ref 6–20)
CHLORIDE: 102 mmol/L (ref 101–111)
CO2: 34 mmol/L — ABNORMAL HIGH (ref 22–32)
Calcium: 8.4 mg/dL — ABNORMAL LOW (ref 8.9–10.3)
Creatinine, Ser: 0.5 mg/dL (ref 0.44–1.00)
GFR calc Af Amer: >60 mL/min (ref >60)
Glucose, Bld: 104 mg/dL — ABNORMAL HIGH (ref 65–99)
POTASSIUM: 3.6 mmol/L (ref 3.5–5.1)
Sodium: 139 mmol/L (ref 135–145)
TOTAL PROTEIN: 6.7 g/dL (ref 6.5–8.1)
Total Bilirubin: 0.5 mg/dL (ref 0.3–1.2)

## 2016-03-02 LAB — URINALYSIS, ROUTINE W REFLEX MICROSCOPIC
Bilirubin Urine: NEGATIVE
GLUCOSE, UA: NEGATIVE mg/dL
KETONES UR: NEGATIVE mg/dL
LEUKOCYTES UA: NEGATIVE
NITRITE: NEGATIVE
Specific Gravity, Urine: 1.025 (ref 1.005–1.030)
pH: 6 (ref 5.0–8.0)

## 2016-03-02 LAB — LIPASE, BLOOD: LIPASE: 26 U/L (ref 11–51)

## 2016-03-02 LAB — URINE MICROSCOPIC-ADD ON

## 2016-03-02 LAB — CBC WITH DIFFERENTIAL/PLATELET
BASOS ABS: 0 10*3/uL (ref 0.0–0.1)
BASOS PCT: 0 %
Eosinophils Absolute: 0.1 10*3/uL (ref 0.0–0.7)
Eosinophils Relative: 1 %
HEMATOCRIT: 36.3 % (ref 36.0–46.0)
HEMOGLOBIN: 11.5 g/dL — AB (ref 12.0–15.0)
LYMPHS PCT: 18 %
Lymphs Abs: 1.5 10*3/uL (ref 0.7–4.0)
MCH: 32.9 pg (ref 26.0–34.0)
MCHC: 31.7 g/dL (ref 30.0–36.0)
MCV: 103.7 fL — AB (ref 78.0–100.0)
MONO ABS: 0.7 10*3/uL (ref 0.1–1.0)
Monocytes Relative: 8 %
NEUTROS ABS: 6.3 10*3/uL (ref 1.7–7.7)
NEUTROS PCT: 73 %
Platelets: 271 10*3/uL (ref 150–400)
RBC: 3.5 MIL/uL — AB (ref 3.87–5.11)
RDW: 14.1 % (ref 11.5–15.5)
WBC: 8.5 10*3/uL (ref 4.0–10.5)

## 2016-03-02 LAB — I-STAT TROPONIN, ED: TROPONIN I, POC: 0 ng/mL (ref 0.00–0.08)

## 2016-03-02 LAB — TSH: TSH: 1.169 u[IU]/mL (ref 0.350–4.500)

## 2016-03-02 LAB — BRAIN NATRIURETIC PEPTIDE: B Natriuretic Peptide: 284 pg/mL — ABNORMAL HIGH (ref 0.0–100.0)

## 2016-03-02 MED ORDER — POLYETHYLENE GLYCOL 3350 17 G PO PACK
17.0000 g | PACK | Freq: Every day | ORAL | Status: DC | PRN
  Administered 2016-03-04: 17 g via ORAL
  Filled 2016-03-02: qty 1

## 2016-03-02 MED ORDER — ATORVASTATIN CALCIUM 20 MG PO TABS
20.0000 mg | ORAL_TABLET | Freq: Every day | ORAL | Status: DC
  Administered 2016-03-03 – 2016-03-04 (×2): 20 mg via ORAL
  Filled 2016-03-02 (×2): qty 1

## 2016-03-02 MED ORDER — ACETAMINOPHEN 500 MG PO TABS
1000.0000 mg | ORAL_TABLET | Freq: Four times a day (QID) | ORAL | Status: DC | PRN
  Administered 2016-03-05: 1000 mg via ORAL
  Filled 2016-03-02: qty 2

## 2016-03-02 MED ORDER — LEVETIRACETAM 500 MG PO TABS
750.0000 mg | ORAL_TABLET | Freq: Two times a day (BID) | ORAL | Status: DC
  Administered 2016-03-02 – 2016-03-05 (×6): 750 mg via ORAL
  Filled 2016-03-02 (×6): qty 1

## 2016-03-02 MED ORDER — FUROSEMIDE 10 MG/ML IJ SOLN
40.0000 mg | Freq: Once | INTRAMUSCULAR | Status: AC
  Administered 2016-03-02: 40 mg via INTRAVENOUS
  Filled 2016-03-02: qty 4

## 2016-03-02 MED ORDER — GABAPENTIN 300 MG PO CAPS
300.0000 mg | ORAL_CAPSULE | Freq: Every day | ORAL | Status: DC
  Administered 2016-03-02: 300 mg via ORAL
  Filled 2016-03-02: qty 1

## 2016-03-02 MED ORDER — PANTOPRAZOLE SODIUM 40 MG PO TBEC
40.0000 mg | DELAYED_RELEASE_TABLET | Freq: Every day | ORAL | Status: DC
  Administered 2016-03-03 – 2016-03-05 (×3): 40 mg via ORAL
  Filled 2016-03-02 (×3): qty 1

## 2016-03-02 MED ORDER — DONEPEZIL HCL 5 MG PO TABS
10.0000 mg | ORAL_TABLET | Freq: Every day | ORAL | Status: DC
  Administered 2016-03-02 – 2016-03-04 (×3): 10 mg via ORAL
  Filled 2016-03-02 (×3): qty 2

## 2016-03-02 MED ORDER — FUROSEMIDE 10 MG/ML IJ SOLN
40.0000 mg | Freq: Two times a day (BID) | INTRAMUSCULAR | Status: DC
  Administered 2016-03-03 – 2016-03-05 (×5): 40 mg via INTRAVENOUS
  Filled 2016-03-02 (×5): qty 4

## 2016-03-02 MED ORDER — SODIUM CHLORIDE 0.9% FLUSH
3.0000 mL | Freq: Two times a day (BID) | INTRAVENOUS | Status: DC
  Administered 2016-03-02 – 2016-03-05 (×5): 3 mL via INTRAVENOUS

## 2016-03-02 MED ORDER — MIRTAZAPINE 30 MG PO TABS
30.0000 mg | ORAL_TABLET | Freq: Every day | ORAL | Status: DC
  Administered 2016-03-02 – 2016-03-04 (×3): 30 mg via ORAL
  Filled 2016-03-02 (×3): qty 1

## 2016-03-02 MED ORDER — APIXABAN 5 MG PO TABS
5.0000 mg | ORAL_TABLET | Freq: Two times a day (BID) | ORAL | Status: DC
  Administered 2016-03-02 – 2016-03-03 (×2): 5 mg via ORAL
  Filled 2016-03-02 (×2): qty 1

## 2016-03-02 MED ORDER — METOPROLOL TARTRATE 50 MG PO TABS
50.0000 mg | ORAL_TABLET | Freq: Three times a day (TID) | ORAL | Status: DC
  Administered 2016-03-02 – 2016-03-05 (×9): 50 mg via ORAL
  Filled 2016-03-02 (×9): qty 1

## 2016-03-02 MED ORDER — GABAPENTIN 300 MG PO CAPS
300.0000 mg | ORAL_CAPSULE | Freq: Once | ORAL | Status: AC
  Administered 2016-03-02: 300 mg via ORAL
  Filled 2016-03-02: qty 1

## 2016-03-02 MED ORDER — BUSPIRONE HCL 5 MG PO TABS
5.0000 mg | ORAL_TABLET | Freq: Three times a day (TID) | ORAL | Status: DC
  Administered 2016-03-02 – 2016-03-05 (×9): 5 mg via ORAL
  Filled 2016-03-02 (×10): qty 1

## 2016-03-02 MED ORDER — FLUOXETINE HCL 20 MG PO CAPS
20.0000 mg | ORAL_CAPSULE | Freq: Every day | ORAL | Status: DC
  Administered 2016-03-03 – 2016-03-05 (×3): 20 mg via ORAL
  Filled 2016-03-02 (×3): qty 1

## 2016-03-02 MED ORDER — AMLODIPINE BESYLATE 5 MG PO TABS
10.0000 mg | ORAL_TABLET | Freq: Every day | ORAL | Status: DC
  Administered 2016-03-03 – 2016-03-05 (×3): 10 mg via ORAL
  Filled 2016-03-02 (×3): qty 2

## 2016-03-02 MED ORDER — HYDROCODONE-ACETAMINOPHEN 5-325 MG PO TABS
1.0000 | ORAL_TABLET | Freq: Four times a day (QID) | ORAL | Status: DC | PRN
  Administered 2016-03-03 – 2016-03-05 (×7): 1 via ORAL
  Filled 2016-03-02 (×7): qty 1

## 2016-03-02 NOTE — ED Triage Notes (Signed)
Pt reports right flank pain "for a while now."  Was sent from Dr. Griffin Dakin office for evaluation.  Caregiver states has really been complaining since Friday.

## 2016-03-02 NOTE — ED Notes (Signed)
To X-ray

## 2016-03-02 NOTE — Patient Outreach (Signed)
[facility]) Care Management  03/02/2016  Dorean Carl [facility] 09-Jun-1930 [id number]  I was notified by Mrs. Koestner's son [name] that Mrs. Flanigan is at the emergency room at [facility] having been sent there by Dr.Margaret Moshe Cipro, primary care provider.   I have notified [place] and will follow Mrs. Ceniceros's hospital course.    [place] Management  [phone]

## 2016-03-02 NOTE — ED Notes (Signed)
Pt cleaned & bed linens changed.

## 2016-03-02 NOTE — ED Provider Notes (Signed)
Emergency Department Provider Note   I have reviewed the triage vital signs and the nursing notes.   HISTORY  Chief Complaint Flank Pain   HPI Erin [name] is a 82 y.o. female with PMH of a-fib, CVA, HTN, GERD, HLD, and known right pleural effusion presents to the emergency department for evaluation of right lower chest discomfort and suprapubic discomfort for the past 4 days with associated poor PO intake and generalized fatigue. Patient reports progressive worsening pain in the right chest. She does have a known pleural effusion there has not required thoracentesis. No associated fever or shaking chills. She uses oxygen at night but not during the day.   Patient also describes some suprapubic discomfort with decreased urination. Last urine output was this morning and the patient's caretaker describes it as dribbling. No known history of urinary retention. Patient does not use Foley catheterization. She denies upper abdominal discomfort, nausea, vomiting, diarrhea. She was seen by her primary care physician today who referred her to the emergency department with low oxygen saturation in the office, decreased PO intake, and concern for possible UTI.   Past Medical History:  Diagnosis Date  . Anxiety   . Arthritis   . Atrial fibrillation ([place])   . Cardioembolic stroke ([place]) [id number]  . Carpal tunnel syndrome of left wrist   . Cataract   . Esophageal dilatation   . Essential hypertension, benign   . Focal motor seizure disorder ([place]) 12/24/2014   Left leg involvment  . Focal seizures ([place]) 10/08/2015  . Frequent falls   . Gall bladder disease   . GERD (gastroesophageal reflux disease)   . Hearing loss   . History of nuclear stress test 08/31/2012   Lexiscan cardiolite negative for ischemia  . History of stroke with current residual effects 11/20/2014  . Hyperlipidemia   . Leg edema   . Obese   . Osteoporosis   . Polyneuropathy in other diseases classified elsewhere ([place])  10/03/2013  . Restless leg   . Seizures ([place])   . Sequela, post-stroke 04/04/2015    Hemiparesis, nondominant hemisphere  . Sleep apnea   . Tremor   . UTI (lower urinary tract infection)     Patient Active Problem List   Diagnosis Date Noted  . Pressure ulcer 03/03/2016  . SOB (shortness of breath) 03/02/2016  . Nocturnal hypoxemia 02/09/2016  . Depression 12/28/2015  . Grief at loss of child 10/30/2015  . Benign essential tremor 07/31/2015  . Sequela, post-stroke 04/04/2015  . Pigmented skin lesion 03/31/2015  . Pleural effusion, bilateral 03/11/2015  . Focal motor seizure disorder ([place]) 12/24/2014  . History of stroke with current residual effects 11/20/2014  . Sleep apnea 07/14/2014  . Senile dementia with behavioral disturbance 07/14/2014  . Moderate tricuspid regurgitation 07/14/2014  . Left arm weakness 07/14/2014  . Hyperlipidemia LDL goal <70 07/08/2014  . Cardioembolic stroke ([place]) 123XX123  . Anxiety state 05/01/2014  . Cerebral embolism with cerebral infarction ([place]) 03/23/2014  . Allergic rhinitis 10/15/2013  . Insomnia 10/15/2013  . Polyneuropathy in other diseases classified elsewhere ([place]) 10/03/2013  . Chronic atrial fibrillation ([place]) 04/09/2013  . DNR (do not resuscitate) 02/13/2013  . Hearing loss 02/13/2013  . Muscle weakness (generalized) 11/24/2012  . Unspecified constipation 09/05/2012  . FH: colon cancer 02/23/2012  . Other vitamin B12 deficiency anemia 11/08/2011  . Essential and other specified forms of tremor 11/08/2011  . Osteoporosis, senile   . Prediabetes 03/16/2011  . SPINAL STENOSIS, LUMBAR 11/20/2009  .  SCIATICA 11/13/2009  . OSTEOARTHRITIS, KNEE, RIGHT 09/11/2009  . UNSTEADY GAIT 09/11/2009  . Essential hypertension, benign 01/02/2009  . GERD 01/02/2009  . HEMORRHAGE OF RECTUM AND ANUS 01/02/2009  . ARTHRITIS, LEFT FOOT 07/17/2007    Past Surgical History:  Procedure Laterality Date  . ABDOMINAL HYSTERECTOMY    . APPENDECTOMY     . BENIGN CYST REMOVED FROM KIDNEY AND LUNG, BILATERAL MASTECTOMY    . BILATERAL GREAT TOENAIL REMOVAL    . BREAST SURGERY     Bilateral 2000  . BRONCHOSCOPY  02/15/2000  . CARPAL TUNNEL RELEASE     Left wrist  . CATARACT EXTRACTION    . CATARACT EXTRACTION, BILATERAL    . CHOLECYSTECTOMY    . CHOLECYSTECTOMY    . COLONOSCOPY  01/17/2009   Dr. Deatra Ina : Internal hemorrhoids/Diverticula, scattered in the ascending colon/  Moderate diverticulosis ascending colon to sigmoid colon  . COLONOSCOPY  01/16/2001   Normal  . ENDOVENOUS ABLATION SAPHENOUS VEIN W/ LASER  01/2011   Right GSV  . ESOPHAGOGASTRODUODENOSCOPY   04/23/2003   Dr. Deatra Ina: HIATAL HERNIA  . ESOPHAGOGASTRODUODENOSCOPY  03/20/2012   [id number] ring-LIKELY CAUSING MILD DYSPHAGIA/SMALL hiatal hernia/Multiple sessile polyps ranging between 3-57mm , path benign  . FRACTURE SURGERY     Left arm x4  . INTRAMEDULLARY (IM) NAIL INTERTROCHANTERIC Left 03/25/2014   Procedure: INTRAMEDULLARY NAIL INTERTROCHANTRIC LEFT HIP;  Surgeon: Marianna Payment, MD;  Location: [facility];  Service: Orthopedics;  Laterality: Left;  . KNEE ARTHROSCOPY  2012   Right knee, Dr Aline Brochure  . Left forearm    . R HAND SURGERY    . Right upper lobe wedge resection.  "  . Right VATS.  "  . TONSILLECTOMY    . TRANSTHORACIC ECHOCARDIOGRAM  06/24/2009   EF=>55%, mild assymetric LVH; LA mildly dilated; mild mitral annular calcif, borderline MVP, mild-mod MR; mild-mod TR, RV systolic pressure elevated, mild pulm HTN; AV mildly sclerotic; mild pulm valve regurg - ordered r/t bradycardia   . UPPER GASTROINTESTINAL ENDOSCOPY  11/25/1999   Esophagitis/ Normal proximal esophagus, stomach and duodenum      Allergies Codeine; Morphine and related; Actonel [risedronate sodium]; Fosamax [alendronate sodium]; Losartan; Nitrofurantoin; and Penicillins  Family History  Problem Relation Age of Onset  . Diabetes Mother   . Heart disease Mother   . Stroke Mother   .  Cancer Sister     KIDNEY  . Emphysema Sister   . Heart disease Brother   . Heart disease Sister   . Emphysema Sister   . Cancer Sister     BREAST  . Heart disease Brother   . Colon cancer Sister   . Alzheimer's disease Father   . Heart disease Sister   . Tremor Sister   . Tremor Brother   . Cancer Brother   . Pancreatic cancer Brother   . Diabetes Son   . Alcoholism Child     Social History Social History  Substance Use Topics  . Smoking status: Never Smoker  . Smokeless tobacco: Never Used  . Alcohol use No    Review of Systems  Constitutional: No fever/chills Eyes: No visual changes. ENT: No sore throat. Cardiovascular: Possible right sided chest pain. Respiratory: Denies shortness of breath. Gastrointestinal: Suprapubic abdominal pain.  No nausea, no vomiting.  No diarrhea.  No constipation. Genitourinary: Negative for dysuria. Positive decreased urine output.  Musculoskeletal: Negative for back pain. Skin: Negative for rash. Neurological: Negative for headaches, focal weakness or numbness.  10-point ROS  otherwise negative.  ____________________________________________   PHYSICAL EXAM:  VITAL SIGNS: ED Triage Vitals  Enc Vitals Group     BP 03/02/16 1544 145/98     Pulse --      Resp --      Temp 03/02/16 1544 98.4 F (36.9 C)     Temp Source 03/02/16 1544 Oral     Weight 03/02/16 1540 117 lb (53.1 kg)     Height 03/02/16 1540 5\' 4"  (1.626 m)     Pain Score 03/02/16 1540 8    Constitutional: Alert and oriented. Chronically ill-appearing and frail.  Eyes: Conjunctivae are normal.  Head: Atraumatic. Nose: No congestion/rhinnorhea. Mouth/Throat: Mucous membranes are dry.  Oropharynx non-erythematous. Neck: No stridor.   Cardiovascular: Normal rate, regular rhythm. Good peripheral circulation. Grossly normal heart sounds.   Respiratory: Normal respiratory effort.  No retractions. Lungs with diminished right lower lung field sounds.    Gastrointestinal: Soft and nontender. No distention.  Musculoskeletal: No lower extremity tenderness with notable 2+ pitting edema in the LE bilaterally. No overlying cellulitis. No gross deformities of extremities. Significant kyphosis of the spine.  Neurologic:  Normal speech and language. No gross focal neurologic deficits are appreciated.  Skin:  Skin is warm, dry and intact. No rash noted. Psychiatric: Mood and affect are normal. Speech and behavior are normal.  ____________________________________________   LABS (all labs ordered are listed, but only abnormal results are displayed)  Labs Reviewed  COMPREHENSIVE METABOLIC PANEL - Abnormal; Notable for the following:       Result Value   CO2 34 (*)    Glucose, Bld 104 (*)    Calcium 8.4 (*)    Albumin 3.1 (*)    ALT 6 (*)    Anion gap 3 (*)    All other components within normal limits  CBC WITH DIFFERENTIAL/PLATELET - Abnormal; Notable for the following:    RBC 3.50 (*)    Hemoglobin 11.5 (*)    MCV 103.7 (*)    All other components within normal limits  BRAIN NATRIURETIC PEPTIDE - Abnormal; Notable for the following:    B Natriuretic Peptide 284.0 (*)    All other components within normal limits  URINALYSIS, ROUTINE W REFLEX MICROSCOPIC (NOT AT [facility]) - Abnormal; Notable for the following:    Hgb urine dipstick MODERATE (*)    Protein, ur TRACE (*)    All other components within normal limits  URINE MICROSCOPIC-ADD ON - Abnormal; Notable for the following:    Squamous Epithelial / LPF 0-5 (*)    Bacteria, UA FEW (*)    All other components within normal limits  MRSA PCR SCREENING  LIPASE, BLOOD  TSH  I-STAT TROPOININ, ED   ____________________________________________  EKG   EKG Interpretation  Date/Time:  Tuesday March 02 2016 17:53:14 EDT Ventricular Rate:  88 PR Interval:    QRS Duration: 104 QT Interval:  381 QTC Calculation: 461 R Axis:   91 Text Interpretation:  Atrial fibrillation Right axis  deviation Borderline low voltage, extremity leads Minimal ST depression, inferior leads No STEMI.  Confirmed by Marquez Ceesay MD, Asir Bingley [id number]) on 03/02/2016 6:05:22 PM       ____________________________________________  RADIOLOGY  Dg Chest 2 View  Result Date: 03/02/2016 CLINICAL DATA:  Shortness of breath. EXAM: CHEST  2 VIEW COMPARISON:  01/06/2016 FINDINGS: Cardiomediastinal silhouette is normal. Mediastinal contours appear intact. Calcific atherosclerotic disease of the thoracic aorta. There is no evidence of pneumothorax. There are bilateral pleural effusions, larger on the right,  moderate in volume. Mild interstitial pulmonary edema. Prior right upper lobe resection stable. Exaggerated thoracic kyphosis with osteopenia and poorly visualized compression deformities of several vertebral bodies of the mid and lower thoracic spine. Soft tissues are grossly normal. Stable deformity of the left humerus. IMPRESSION: Enlarging bilateral pleural effusions. Mild pulmonary edema. Osteopenia with probable compression fractures of several vertebral bodies of the mid and lower thoracic spine, poorly visualized. Electronically Signed   By: Fidela Salisbury M.D.   On: 03/02/2016 17:38    ____________________________________________   PROCEDURES  Procedure(s) performed:   Procedures  None ____________________________________________   INITIAL IMPRESSION / ASSESSMENT AND PLAN / ED COURSE  Pertinent labs & imaging results that were available during my care of the patient were reviewed by me and considered in my medical decision making (see chart for details).  Patient resents to the emergency department for evaluation of right lower chest discomfort for the past 4 days. Patient also with suprapubic discomfort and decreased urinary output today. Patient does have history of right-sided pleural effusion. Past x-rays described as small and has not required thoracentesis. She does have an oxygen  requirement at night while sleeping but oxygen saturation emergency department is in the mid to occasionally low 80s. Unclear if this represents worsening of her pleural effusion or possible underlying pneumonia. We will obtain x-ray, troponin, EKG. Low suspicion for ACS or DVT/PE in this patient. We'll also obtain bladder scan to evaluate for urinary obstruction. Will send urine for UA to rule out infection. Place patient on [place] O2 in the ED and reassess.   Patient with enlarging pleural effusions bilaterally with associated edema. No focal consolidation to suggest PNA. Discussed case with Dr. Marin Comment who will be down for ED evaluation and order placement. Appreciate assistance with case.   Updated family and patient at bedside. Reviewed all labs, CXR, and EKG.  ____________________________________________  FINAL CLINICAL IMPRESSION(S) / ED DIAGNOSES  Final diagnoses:  Hypoxemia  Right-sided chest pain     MEDICATIONS GIVEN DURING THIS VISIT:  Medications  amLODipine (NORVASC) tablet 10 mg (10 mg Oral Given 03/03/16 0900)  HYDROcodone-acetaminophen (NORCO/VICODIN) 5-325 MG per tablet 1 tablet (1 tablet Oral Given 03/03/16 1027)  FLUoxetine (PROZAC) capsule 20 mg (20 mg Oral Given 03/03/16 0900)  donepezil (ARICEPT) tablet 10 mg (10 mg Oral Given 03/02/16 2220)  pantoprazole (PROTONIX) EC tablet 40 mg (40 mg Oral Given 03/03/16 0825)  metoprolol (LOPRESSOR) tablet 50 mg (50 mg Oral Given 03/03/16 0826)  busPIRone (BUSPAR) tablet 5 mg (5 mg Oral Given 03/03/16 0900)  atorvastatin (LIPITOR) tablet 20 mg (not administered)  mirtazapine (REMERON) tablet 30 mg (30 mg Oral Given 03/02/16 2221)  levETIRAcetam (KEPPRA) tablet 750 mg (750 mg Oral Given 03/03/16 0900)  polyethylene glycol (MIRALAX / GLYCOLAX) packet 17 g (not administered)  acetaminophen (TYLENOL) tablet 1,000 mg (not administered)  sodium chloride flush (NS) 0.9 % injection 3 mL (3 mLs Intravenous Given 03/03/16 0900)  furosemide (LASIX)  injection 40 mg (40 mg Intravenous Given 03/03/16 0827)  gabapentin (NEURONTIN) capsule 100 mg (not administered)  gabapentin (NEURONTIN) capsule 200 mg (not administered)  gabapentin (NEURONTIN) capsule 300 mg (not administered)  gabapentin (NEURONTIN) capsule 300 mg (300 mg Oral Given 03/02/16 1735)  furosemide (LASIX) injection 40 mg (40 mg Intravenous Given 03/02/16 1927)     NEW OUTPATIENT MEDICATIONS STARTED DURING THIS VISIT:  None   Note:  This document was prepared using Dragon voice recognition software and may include unintentional dictation errors.  Nanda Quinton,  MD Emergency Medicine  Margette Fast, MD 03/03/16 [phone]

## 2016-03-02 NOTE — ED Notes (Addendum)
Patient placed on 2 lpm of oxygen via [place]. Sats increased to 96%.

## 2016-03-02 NOTE — ED Notes (Signed)
Pt & family made aware of the current room assignment.

## 2016-03-02 NOTE — ED Notes (Signed)
Family states pt has a sore to right calf & buttocks. Pt is being scheduled at wound clinic at [facility].

## 2016-03-02 NOTE — Patient Instructions (Addendum)
You need to goto the ED for further evaluation and management due to 5 day h/o increased fatigue and poor appetite and worsening right chest pain  F/u in 4 weeks  I have spoken with the ED doc , so you are expected  Ulcer on right leg is re dressed  Hope that you will feel better soon

## 2016-03-02 NOTE — H&P (Signed)
History and Physical    Erin [name] [id number] DOB: Jul 09, 1930 DOA: 03/02/2016  Referring MD/NP/PA: Nanda Quinton, MD PCP: Tula Nakayama, MD  Outpatient Specialists:   Ortho  Cardiology  Cardiothoracic surgery  Gastroenterology  Neurology  Urology  Patient coming from: home  Chief Complaint: flank pain  HPI: Erin [name] is a 82 y.o. female with medical history significant of HTN, HLD, GERD, CVA, a-fib, depression, chronic pleural effusion, who presents to the emergency department with complaints of flank pain for the past 4 days. She reports decreased oral intake, decreased urine output, though no history or urinary retention and does not use a foley cath. She does report generalized fatigue and decreased oral intake. Also reports abd discomfort, nausea, vomiting, diarrhea.  She also reports worsening right chest pain, and has a known pleural effusion, but does not report any shortness of breath. She uses supplemental O2 at night.   ED Course: While in the ED, patient BNP was mildly elevated, though troponin negative. CBC unremarkable. UA negative for any infection. CXR showed enlarging bilateral pleural effusions. Hospitalist was asked to refer for admission for treatment of enlarging bilateral pleural effusion.   Review of Systems: As per HPI otherwise 10 point review of systems negative.   Past Medical History:  Diagnosis Date  . Anxiety   . Arthritis   . Atrial fibrillation ([place])   . Cardioembolic stroke ([place]) [id number]  . Carpal tunnel syndrome of left wrist   . Cataract   . Esophageal dilatation   . Essential hypertension, benign   . Focal motor seizure disorder ([place]) 12/24/2014   Left leg involvment  . Focal seizures ([place]) 10/08/2015  . Frequent falls   . Gall bladder disease   . GERD (gastroesophageal reflux disease)   . Hearing loss   . History of nuclear stress test 08/31/2012   Lexiscan cardiolite negative for ischemia  . History of stroke with  current residual effects 11/20/2014  . Hyperlipidemia   . Leg edema   . Obese   . Osteoporosis   . Polyneuropathy in other diseases classified elsewhere ([place]) 10/03/2013  . Restless leg   . Seizures ([place])   . [place], post-stroke 04/04/2015    Hemiparesis, nondominant hemisphere  . Sleep apnea   . Tremor   . UTI (lower urinary tract infection)     Past Surgical History:  Procedure Laterality Date  . ABDOMINAL HYSTERECTOMY    . APPENDECTOMY    . BENIGN CYST REMOVED FROM KIDNEY AND LUNG, BILATERAL MASTECTOMY    . BILATERAL GREAT TOENAIL REMOVAL    . BREAST SURGERY     Bilateral 2000  . BRONCHOSCOPY  02/15/2000  . CARPAL TUNNEL RELEASE     Left wrist  . CATARACT EXTRACTION    . CATARACT EXTRACTION, BILATERAL    . CHOLECYSTECTOMY    . CHOLECYSTECTOMY    . COLONOSCOPY  01/17/2009   Dr. Deatra Ina : Internal hemorrhoids/Diverticula, scattered in the ascending colon/  Moderate diverticulosis ascending colon to sigmoid colon  . COLONOSCOPY  01/16/2001   Normal  . ENDOVENOUS ABLATION SAPHENOUS VEIN W/ LASER  01/2011   Right GSV  . ESOPHAGOGASTRODUODENOSCOPY   04/23/2003   Dr. Deatra Ina: HIATAL HERNIA  . ESOPHAGOGASTRODUODENOSCOPY  03/20/2012   [id number] ring-LIKELY CAUSING MILD DYSPHAGIA/SMALL hiatal hernia/Multiple sessile polyps ranging between 3-65mm , path benign  . FRACTURE SURGERY     Left arm x4  . INTRAMEDULLARY (IM) NAIL INTERTROCHANTERIC Left 03/25/2014   Procedure: INTRAMEDULLARY NAIL INTERTROCHANTRIC LEFT HIP;  Surgeon:  Naip[place] Eduard Roux, MD;  Location: [facility];  Service: Orthopedics;  Laterality: Left;  . KNEE ARTHROSCOPY  2012   Right knee, Dr Aline Brochure  . Left forearm    . R HAND SURGERY    . Right upper lobe wedge resection.  "  . Right VATS.  "  . TONSILLECTOMY    . TRANSTHORACIC ECHOCARDIOGRAM  06/24/2009   EF=>55%, mild assymetric LVH; LA mildly dilated; mild mitral annular calcif, borderline MVP, mild-mod MR; mild-mod TR, RV systolic pressure elevated, mild pulm  HTN; AV mildly sclerotic; mild pulm valve regurg - ordered r/t bradycardia   . UPPER GASTROINTESTINAL ENDOSCOPY  11/25/1999   Esophagitis/ Normal proximal esophagus, stomach and duodenum     reports that she has never smoked. She has never used smokeless tobacco. She reports that she does not drink alcohol or use drugs.  Allergies  Allergen Reactions  . Codeine Hives  . Morphine And Related Itching and Other (See Comments)    Makes pt hallucinate  . Actonel [Risedronate Sodium] Other (See Comments)    Abdominal pain  . Fosamax [Alendronate Sodium] Other (See Comments)    Abdominal pian  . Losartan Other (See Comments)    Hospitalized in 06/2013 with pancreatitis, losartan is associated with increased risk of pancreatitis ,  Hence discontinued  . Nitrofurantoin Other (See Comments)    Hospitalized with pancreatitis in 06/2013. Nitrofurantoin implicated as a possible cause  . Penicillins     Family History  Problem Relation Age of Onset  . Diabetes Mother   . Heart disease Mother   . Stroke Mother   . Cancer Sister     KIDNEY  . Emphysema Sister   . Heart disease Brother   . Heart disease Sister   . Emphysema Sister   . Cancer Sister     BREAST  . Heart disease Brother   . Colon cancer Sister   . Alzheimer's disease Father   . Heart disease Sister   . Tremor Sister   . Tremor Brother   . Cancer Brother   . Pancreatic cancer Brother   . Diabetes Son   . Alcoholism Child    Prior to Admission medications   Medication Sig Start Date End Date Taking? Authorizing Provider  acetaminophen (TYLENOL) 500 MG tablet Take 1,000 mg by mouth every 6 (six) hours as needed for mild pain or moderate pain.   Yes Historical Provider, MD  ALPRAZolam Duanne Moron) 0.5 MG tablet TAKE ONE & ONE-HALF TABLETS BY MOUTH ONCE DAILY AT BEDTIME AS NEEDED FOR ANXIETY 12/10/15  Yes Kathrynn Ducking, MD  amLODipine (NORVASC) 10 MG tablet TAKE ONE TABLET BY MOUTH ONCE DAILY 02/27/16  Yes Fayrene Helper,  MD  apixaban (ELIQUIS) 5 MG TABS tablet Take 1 tablet (5 mg total) by mouth 2 (two) times daily. 10/07/15  Yes Kathrynn Ducking, MD  atorvastatin (LIPITOR) 20 MG tablet TAKE ONE TABLET BY MOUTH ONCE DAILY 11/03/15  Yes Fayrene Helper, MD  busPIRone (BUSPAR) 5 MG tablet i tablet three times daily Patient taking differently: Take 5 mg by mouth 3 (three) times daily. i tablet three times daily 10/28/15  Yes Fayrene Helper, MD  donepezil (ARICEPT) 10 MG tablet TAKE ONE TABLET BY MOUTH ONCE DAILY AT BEDTIME 11/27/15  Yes Fayrene Helper, MD  doxylamine, Sleep, (UNISOM) 25 MG tablet Take 25 mg by mouth at bedtime. Reported on 12/26/2015   Yes Historical Provider, MD  Fluoxetine HCl, PMDD, 20 MG TABS One tablet  once daily Patient taking differently: Take 20 mg by mouth daily. One tablet once daily 12/04/15  Yes Fayrene Helper, MD  gabapentin (NEURONTIN) 100 MG capsule TAKE ONE CAPSULE BY MOUTH IN THE MORNING AND TWO CAPSULES AT MIDDAY, THEN THREE CAPSULES IN THE EVENING 11/27/15  Yes Kathrynn Ducking, MD  HYDROcodone-acetaminophen (NORCO/VICODIN) 5-325 MG tablet Take 1 tablet by mouth every 6 (six) hours as needed for moderate pain. 02/09/16  Yes Kathrynn Ducking, MD  levETIRAcetam (KEPPRA) 750 MG tablet TAKE ONE TABLET BY MOUTH TWICE DAILY 09/02/15  Yes Kathrynn Ducking, MD  metoprolol (LOPRESSOR) 50 MG tablet TAKE ONE-HALF TABLET BY MOUTH THREE TIMES DAILY 11/13/15  Yes Fayrene Helper, MD  mirtazapine (REMERON) 30 MG tablet Take 1 tablet (30 mg total) by mouth at bedtime. 10/08/15  Yes Kathrynn Ducking, MD  montelukast (SINGULAIR) 10 MG tablet TAKE ONE TABLET BY MOUTH AT BEDTIME 12/05/15  Yes Fayrene Helper, MD  mupirocin ointment (BACTROBAN) 2 % Place 1 application into the nose 2 (two) times daily. 11/23/15  Yes Orvan Falconer, MD  pantoprazole (PROTONIX) 40 MG tablet Take 1 tablet (40 mg total) by mouth daily before breakfast. 11/23/15  Yes Orvan Falconer, MD  polyethylene glycol (MIRALAX / GLYCOLAX)  packet Take 17 g by mouth daily. Patient taking differently: Take 17 g by mouth daily as needed for mild constipation.  05/05/15  Yes Carole Civil, MD  silver sulfADIAZINE (SILVADENE) 1 % cream Apply 1 application topically daily. 02/24/16  Yes Fayrene Helper, MD  Vitamin D, Ergocalciferol, (DRISDOL) 50000 units CAPS capsule TAKE ONE CAPSULE BY MOUTH ONCE A WEEK (EVERY 7 DAYS) 01/28/16  Yes Fayrene Helper, MD  predniSONE (DELTASONE) 20 MG tablet Take 1 tablet (20 mg total) by mouth daily with breakfast. Patient not taking: Reported on 03/02/2016 12/16/15   [place] Nakayama, MD  predniSONE (DELTASONE) 5 MG tablet Take 1 tablet (5 mg total) by mouth daily with breakfast. Patient not taking: Reported on 03/02/2016 01/06/16   [place] Nakayama, MD    Physical Exam: Vitals:   03/02/16 1700 03/02/16 1800 03/02/16 1822 03/02/16 1830  BP: 118/68 124/66  124/57  Pulse: 71 76 92 79  Resp: 20 21 20 20   Temp:      TempSrc:      SpO2: (!) 83% (!) 88% 96% 96%  Weight:      Height:          Constitutional: NAD, calm, comfortable Vitals:   03/02/16 1700 03/02/16 1800 03/02/16 1822 03/02/16 1830  BP: 118/68 124/66  124/57  Pulse: 71 76 92 79  Resp: 20 21 20 20   Temp:      TempSrc:      SpO2: (!) 83% (!) 88% 96% 96%  Weight:      Height:       Eyes: PERRL, lids and conjunctivae normal ENMT: Mucous membranes are moist. Posterior pharynx clear of any exudate or lesions.Normal dentition.  Neck: normal, supple, no masses, no thyromegaly Respiratory: clear to auscultation bilaterally, no wheezing, no crackles. Normal respiratory effort. No accessory muscle use.  Cardiovascular: Regular rate and rhythm, no murmurs / rubs / gallops. No extremity edema. 2+ pedal pulses. No carotid bruits.  Abdomen: no tenderness, no masses palpated. No hepatosplenomegaly. Bowel sounds positive.  Musculoskeletal: no clubbing / cyanosis. No joint deformity upper and lower extremities. Good ROM, no  contractures. Normal muscle tone.  Skin: no rashes, lesions, ulcers. No induration Neurologic: CN 2-12 grossly  intact. Sensation intact, DTR normal. Strength 5/5 in all 4.  Psychiatric: Normal judgment and insight. Alert and oriented x 3. Normal mood.   Labs on Admission: I have personally reviewed following labs and imaging studies  CBC:  Recent Labs Lab 03/02/16 1655  WBC 8.5  NEUTROABS 6.3  HGB 11.5*  HCT 36.3  MCV 103.7*  PLT 99991111   Basic Metabolic Panel:  Recent Labs Lab 03/02/16 1655  NA 139  K 3.6  CL 102  CO2 34*  GLUCOSE 104*  BUN 12  CREATININE 0.50  CALCIUM 8.4*   GFR: Estimated Creatinine Clearance: 43.9 mL/min (by C-G formula based on SCr of 0.8 mg/dL). Liver Function Tests:  Recent Labs Lab 03/02/16 1655  AST 18  ALT 6*  ALKPHOS 79  BILITOT 0.5  PROT 6.7  ALBUMIN 3.1*    Recent Labs Lab 03/02/16 1655  LIPASE 26   Urine analysis:    Component Value Date/Time   COLORURINE YELLOW 03/02/2016 1801   APPEARANCEUR CLEAR 03/02/2016 1801   LABSPEC 1.025 03/02/2016 1801   PHURINE 6.0 03/02/2016 1801   GLUCOSEU NEGATIVE 03/02/2016 1801   HGBUR MODERATE (A) 03/02/2016 1801   BILIRUBINUR NEGATIVE 03/02/2016 1801   BILIRUBINUR neg 03/31/2015 1450   KETONESUR NEGATIVE 03/02/2016 1801   PROTEINUR TRACE (A) 03/02/2016 1801   UROBILINOGEN 0.2 05/26/2015 1400   NITRITE NEGATIVE 03/02/2016 1801   LEUKOCYTESUR NEGATIVE 03/02/2016 1801   Radiological Exams on Admission: Dg Chest 2 View  Result Date: 03/02/2016 CLINICAL DATA:  Shortness of breath. EXAM: CHEST  2 VIEW COMPARISON:  01/06/2016 FINDINGS: Cardiomediastinal silhouette is normal. Mediastinal contours appear intact. Calcific atherosclerotic disease of the thoracic aorta. There is no evidence of pneumothorax. There are bilateral pleural effusions, larger on the right, moderate in volume. Mild interstitial pulmonary edema. Prior right upper lobe resection stable. Exaggerated thoracic kyphosis with  osteopenia and poorly visualized compression deformities of several vertebral bodies of the mid and lower thoracic spine. Soft tissues are grossly normal. Stable deformity of the left humerus. IMPRESSION: Enlarging bilateral pleural effusions. Mild pulmonary edema. Osteopenia with probable compression fractures of several vertebral bodies of the mid and lower thoracic spine, poorly visualized. Electronically Signed   By: Fidela Salisbury M.D.   On: 03/02/2016 17:38    EKG: Independently reviewed. Atrial Fibrillation  Assessment/Plan Principal Problem:   Pleural effusion, bilateral Active Problems:   SPINAL STENOSIS, LUMBAR   Chronic atrial fibrillation (HCC)   SOB (shortness of breath)  1. Chronic bilateral pleural effusions, increased in interval since last evaluation. No indication for antibiotic use since likely non-infectious and unlikely to be empyema bilaterally. She reports no shortness of breath at this time. She is averse to any thoracentesis at this time, so will give lasix for now. Continue supplemental O2. Can consider pulmonology consult. 2. Right flank pain, likely just chronic back pain that she has been seen for in the past. No known history of urinary retention. Follow up bladder scan. 3. Afib. She takes eliquis as an outpatient. We will continue.   4. Spinal stenosis, lumbar. Continue neurontin 5. HTN. Stable. Continue metoprolol 6. GERD. Continue PPI. 7. HLD. Continue statins 8. Focal seizures. Continue gabapentin and keppra. 9. Depression.  10. Hx of senile dementia. On aricept.  DVT prophylaxis: Eliquis Code Status: DNR Family Communication: discussed with patient, family, and pastor Disposition Plan: admit as inpatient, discharge home once improved Consults called: none Admission status: admit as inpatient  Orvan Falconer, MD [facility]  If 7PM-7AM,  please contact night-coverage [email or web address] Password [email or web address]  03/02/2016, 7:17 PM   By signing my  name below, I, Delene Ruffini, attest that this documentation has been prepared under the direction and in the presence of Orvan Falconer, MD. Electronically Signed: Delene Ruffini, Scribe 03/02/16 7:45pm

## 2016-03-02 NOTE — ED Notes (Signed)
Bladder Scan: 124 mL

## 2016-03-03 ENCOUNTER — Inpatient Hospital Stay (HOSPITAL_COMMUNITY): Payer: Medicare Other

## 2016-03-03 DIAGNOSIS — M4806 Spinal stenosis, lumbar region: Secondary | ICD-10-CM

## 2016-03-03 DIAGNOSIS — L8990 Pressure ulcer of unspecified site, unspecified stage: Secondary | ICD-10-CM | POA: Insufficient documentation

## 2016-03-03 DIAGNOSIS — I482 Chronic atrial fibrillation: Secondary | ICD-10-CM

## 2016-03-03 DIAGNOSIS — J90 Pleural effusion, not elsewhere classified: Secondary | ICD-10-CM

## 2016-03-03 LAB — MRSA PCR SCREENING: MRSA BY PCR: NEGATIVE

## 2016-03-03 MED ORDER — CHLORHEXIDINE GLUCONATE CLOTH 2 % EX PADS
6.0000 | MEDICATED_PAD | Freq: Every day | CUTANEOUS | Status: DC
  Administered 2016-03-03: 6 via TOPICAL

## 2016-03-03 MED ORDER — MUPIROCIN 2 % EX OINT
1.0000 "application " | TOPICAL_OINTMENT | Freq: Two times a day (BID) | CUTANEOUS | Status: DC
  Administered 2016-03-03: 1 via NASAL
  Filled 2016-03-03: qty 22

## 2016-03-03 MED ORDER — GABAPENTIN 300 MG PO CAPS
300.0000 mg | ORAL_CAPSULE | Freq: Every day | ORAL | Status: DC
  Administered 2016-03-03 – 2016-03-04 (×2): 300 mg via ORAL
  Filled 2016-03-03 (×2): qty 1

## 2016-03-03 MED ORDER — GABAPENTIN 100 MG PO CAPS
100.0000 mg | ORAL_CAPSULE | Freq: Every day | ORAL | Status: DC
  Administered 2016-03-03 – 2016-03-05 (×3): 100 mg via ORAL
  Filled 2016-03-03 (×3): qty 1

## 2016-03-03 MED ORDER — SIMETHICONE 80 MG PO CHEW: 160.0000 mg | CHEWABLE_TABLET | Freq: Four times a day (QID) | ORAL | Status: DC | PRN

## 2016-03-03 MED ORDER — GABAPENTIN 100 MG PO CAPS
200.0000 mg | ORAL_CAPSULE | ORAL | Status: DC
  Administered 2016-03-03 – 2016-03-05 (×3): 200 mg via ORAL
  Filled 2016-03-03 (×4): qty 2

## 2016-03-03 NOTE — Progress Notes (Signed)
Called report to Erin Sias, RN on dept 300. Verbalized understanding. Pt transferred to room 318 in safe and stable condition.

## 2016-03-03 NOTE — Care Management Note (Signed)
Case Management Note  Patient Details  Name: ALDEN RITZERT MRN: [id number] Date of Birth: 01/20/1930  Subjective/Objective:                  Pt admitted for pleural effusion. Patients son at bedside and provides most information. She lives with her son and his family. She is mostly bedbound/WC bound. She is total care and requires being lifted to get out of bed for toileting and sitting up. She has hospital bed, lift chair, BSC and oxygen (thorugh [employer]) at 2lpm. Family states they prefer [employer] for DME but would use [facility] as next choice. She has no HH services but has an aid that is used 3 days a week and as needed for family support. Pt's son feels they would benefit from hoyer lift for pt mobility. Romualdo Bolk, of [facility], aware of DME need and will obtain pt info from chart.   Action/Plan: Anticipate return home with self care, resumption of previous arrangments.   Expected Discharge Date:  03/04/16               Expected Discharge Plan:  Home/Self Care  In-House Referral:  NA  Discharge planning Services  CM Consult  Post Acute Care Choice:  Durable Medical Equipment Choice offered to:  Adult Children  DME Arranged:  Other see comment (hoyer lift) DME Agency:  [place]:    [place] Agency:     Status of Service:  In process, will continue to follow  If discussed at Long Length of Stay Meetings, dates discussed:    Additional Comments:  Sherald Barge, RN 03/03/2016, 2:20 PM

## 2016-03-03 NOTE — Care Management Important Message (Signed)
Important Message  Patient Details  Name: Erin [name] MRN: [id number] Date of Birth: 04-03-1930   Medicare Important Message Given:  Yes    Sherald Barge, RN 03/03/2016, 2:25 PM

## 2016-03-03 NOTE — Progress Notes (Signed)
PROGRESS NOTE    Erin [name]  [id number] DOB: 20-Sep-1929 DOA: 03/02/2016 PCP: Tula Nakayama, MD    Brief Narrative: 48 yof with a hx of HTN, GERD, HLD, CVA, afib, depression, and chronic pleural effusion presents with complaints of flank pain. While in the ED her BNP was noted to be mildly elevated. Her CXR revealed enlarging bilateral pleural effusions. Pt was admitted for further evaluation of enlarging bilateral pleural effusion.    Assessment & Plan:   Principal Problem:   Pleural effusion, bilateral Active Problems:   SPINAL STENOSIS, LUMBAR   Chronic atrial fibrillation (HCC)   SOB (shortness of breath)   1. Chronic bilateral pleural effusions. Patient has been evaluated by [facility] in the past. Last saw Dr. Roxan Hockey on 6/27. Etiology of pleural effusion was felt to be unclear so thoracentesis was ordered at that time. Ultrasound did not reveal a sizable effusion, so thoracentesis was not performed. Since she has had worsening of effusion, will ask radiology to revaluate patient to see if thoracentesis may be performed. She reports no SOB at this time, but was noted to be hypoxic. Will hold eliquis for now, in anticipation of procedure. Will continue IV lasix and supplemental oxygen. Check echocardiogram. 2. Afib, chronic. Anticoagulated on eliquis. Hold eliquis for now due to possible thoracentesis. Heart rate stable 3. Spinal stenosis. Continue gabapentin.  4. HTN. Stable. Continue outpatient regimen.  5. GERD. Continue PPI.  6. HLD. Continue statins.  7. Seizures. Continue outpatient regimen.  8. Depression. Noted.  9. Hx of senile dementia. On Aricept.    DVT prophylaxis: SCDs  Code Status: DNR  Family Communication: discussed with son at the bedside Disposition Plan: Discharge home once improved    Consultants:   None   Procedures:   None   Antimicrobials:   None    Subjective: Denies any shortness of breath or recurrence of chest  pain  Objective: Vitals:   03/03/16 0200 03/03/16 0300 03/03/16 0330 03/03/16 0400  BP: (!) 105/48 103/90  105/66  Pulse: 77 76 78   Resp: 20 17 19    Temp:      TempSrc:      SpO2: 96% 93% 93%   Weight:      Height:        Intake/Output Summary (Last 24 hours) at 03/03/16 0448 Last data filed at 03/02/16 2238  Gross per 24 hour  Intake              100 ml  Output                0 ml  Net              100 ml   Filed Weights   03/02/16 1540 03/02/16 2135 03/02/16 2140  Weight: 53.1 kg (117 lb) 61.1 kg (134 lb 11.2 oz) 61.1 kg (134 lb 11.2 oz)    Examination:  General exam: Appears calm and comfortable  Respiratory system: diminished breath sounds at bases. Respiratory effort normal. Cardiovascular system: S1 & S2 heard, irregular. No JVD, murmurs, rubs, gallops or clicks. 1+ pedal edema. Gastrointestinal system: Abdomen is nondistended, soft and nontender. No organomegaly or masses felt. Normal bowel sounds heard. Central nervous system: Alert and oriented. No focal neurological deficits. Extremities: Symmetric 5 x 5 power. Skin: No rashes, lesions or ulcers Psychiatry: Judgement and insight appear normal. Mood & affect appropriate.     Data Reviewed: I have personally reviewed following labs and imaging studies  CBC:  Recent  Labs Lab 03/02/16 1655  WBC 8.5  NEUTROABS 6.3  HGB 11.5*  HCT 36.3  MCV 103.7*  PLT 99991111   Basic Metabolic Panel:  Recent Labs Lab 03/02/16 1655  NA 139  K 3.6  CL 102  CO2 34*  GLUCOSE 104*  BUN 12  CREATININE 0.50  CALCIUM 8.4*   GFR: Estimated Creatinine Clearance: 49 mL/min (by C-G formula based on SCr of 0.8 mg/dL). Liver Function Tests:  Recent Labs Lab 03/02/16 1655  AST 18  ALT 6*  ALKPHOS 79  BILITOT 0.5  PROT 6.7  ALBUMIN 3.1*    Recent Labs Lab 03/02/16 1655  LIPASE 26   No results for input(s): AMMONIA in the last 168 hours. Coagulation Profile: No results for input(s): INR, PROTIME in the last  168 hours. Cardiac Enzymes: No results for input(s): CKTOTAL, CKMB, CKMBINDEX, TROPONINI in the last 168 hours. BNP (last 3 results) No results for input(s): PROBNP in the last 8760 hours. HbA1C: No results for input(s): HGBA1C in the last 72 hours. CBG: No results for input(s): GLUCAP in the last 168 hours. Lipid Profile: No results for input(s): CHOL, HDL, LDLCALC, TRIG, CHOLHDL, LDLDIRECT in the last 72 hours. Thyroid Function Tests:  Recent Labs  03/02/16 1655  TSH 1.169   Anemia Panel: No results for input(s): VITAMINB12, FOLATE, FERRITIN, TIBC, IRON, RETICCTPCT in the last 72 hours. Sepsis Labs: No results for input(s): PROCALCITON, LATICACIDVEN in the last 168 hours.  No results found for this or any previous visit (from the past 240 hour(s)).       Radiology Studies: Dg Chest 2 View  Result Date: 03/02/2016 CLINICAL DATA:  Shortness of breath. EXAM: CHEST  2 VIEW COMPARISON:  01/06/2016 FINDINGS: Cardiomediastinal silhouette is normal. Mediastinal contours appear intact. Calcific atherosclerotic disease of the thoracic aorta. There is no evidence of pneumothorax. There are bilateral pleural effusions, larger on the right, moderate in volume. Mild interstitial pulmonary edema. Prior right upper lobe resection stable. Exaggerated thoracic kyphosis with osteopenia and poorly visualized compression deformities of several vertebral bodies of the mid and lower thoracic spine. Soft tissues are grossly normal. Stable deformity of the left humerus. IMPRESSION: Enlarging bilateral pleural effusions. Mild pulmonary edema. Osteopenia with probable compression fractures of several vertebral bodies of the mid and lower thoracic spine, poorly visualized. Electronically Signed   By: Fidela Salisbury M.D.   On: 03/02/2016 17:38        Scheduled Meds: . amLODipine  10 mg Oral Daily  . apixaban  5 mg Oral BID  . atorvastatin  20 mg Oral q1800  . busPIRone  5 mg Oral TID  .  donepezil  10 mg Oral QHS  . FLUoxetine  20 mg Oral Daily  . furosemide  40 mg Intravenous BID  . gabapentin  300 mg Oral QHS  . levETIRAcetam  750 mg Oral BID  . metoprolol  50 mg Oral TID  . mirtazapine  30 mg Oral QHS  . pantoprazole  40 mg Oral QAC breakfast  . sodium chloride flush  3 mL Intravenous Q12H   Continuous Infusions:    LOS: 1 day    Time spent: 25 minutes     Kathie Dike, MD [facility] If 7PM-7AM, please contact night-coverage [email or web address] Password [facility] 03/03/2016, 4:48 AM

## 2016-03-04 ENCOUNTER — Encounter (HOSPITAL_COMMUNITY): Payer: Self-pay | Admitting: Primary Care

## 2016-03-04 DIAGNOSIS — R0902 Hypoxemia: Secondary | ICD-10-CM

## 2016-03-04 DIAGNOSIS — Z7189 Other specified counseling: Secondary | ICD-10-CM

## 2016-03-04 DIAGNOSIS — Z515 Encounter for palliative care: Secondary | ICD-10-CM

## 2016-03-04 DIAGNOSIS — I5033 Acute on chronic diastolic (congestive) heart failure: Secondary | ICD-10-CM

## 2016-03-04 LAB — BASIC METABOLIC PANEL
ANION GAP: 10 (ref 5–15)
BUN: 12 mg/dL (ref 6–20)
CO2: 35 mmol/L — AB (ref 22–32)
Calcium: 8.6 mg/dL — ABNORMAL LOW (ref 8.9–10.3)
Chloride: 96 mmol/L — ABNORMAL LOW (ref 101–111)
Creatinine, Ser: 0.54 mg/dL (ref 0.44–1.00)
GLUCOSE: 127 mg/dL — AB (ref 65–99)
POTASSIUM: 3.1 mmol/L — AB (ref 3.5–5.1)
Sodium: 141 mmol/L (ref 135–145)

## 2016-03-04 LAB — CBC
HEMATOCRIT: 36.2 % (ref 36.0–46.0)
HEMOGLOBIN: 11.4 g/dL — AB (ref 12.0–15.0)
MCH: 32.5 pg (ref 26.0–34.0)
MCHC: 31.5 g/dL (ref 30.0–36.0)
MCV: 103.1 fL — ABNORMAL HIGH (ref 78.0–100.0)
Platelets: 274 10*3/uL (ref 150–400)
RBC: 3.51 MIL/uL — AB (ref 3.87–5.11)
RDW: 14 % (ref 11.5–15.5)
WBC: 9.1 10*3/uL (ref 4.0–10.5)

## 2016-03-04 MED ORDER — POTASSIUM CHLORIDE CRYS ER 20 MEQ PO TBCR
40.0000 meq | EXTENDED_RELEASE_TABLET | ORAL | Status: AC
  Administered 2016-03-04 (×2): 40 meq via ORAL
  Filled 2016-03-04 (×2): qty 2

## 2016-03-04 MED ORDER — ONDANSETRON HCL 4 MG/2ML IJ SOLN
4.0000 mg | Freq: Four times a day (QID) | INTRAMUSCULAR | Status: DC | PRN
  Administered 2016-03-04: 4 mg via INTRAVENOUS
  Filled 2016-03-04: qty 2

## 2016-03-04 MED ORDER — APIXABAN 5 MG PO TABS
5.0000 mg | ORAL_TABLET | Freq: Two times a day (BID) | ORAL | Status: DC
  Administered 2016-03-04 – 2016-03-05 (×3): 5 mg via ORAL
  Filled 2016-03-04 (×3): qty 1

## 2016-03-04 NOTE — Consult Note (Signed)
   [facility] Inpatient Consult   03/04/2016  Erin [name] [facility] July 28, 1929 [id number]   Patient is currently active with [facility] for chronic disease management services.  Patient has been engaged by a [employer] and LCSW.  Our community based plan of care has focused on disease management and community resource support.  Patient will receive a post discharge transition of care call and will be evaluated for monthly home visits for assessments and disease process education.  Made Inpatient Case Manager aware that [facility] following.  Of note, [facility] Care Management services does not replace or interfere with any services that are arranged by inpatient case management or social work.   For additional questions or referrals please contact:   Erin [name]. Laymond Purser, RN, BSN, [place] [phone]) Business Cell  [phone]) Toll Free Office

## 2016-03-04 NOTE — Progress Notes (Signed)
PROGRESS NOTE    Erin [name]  [id number] DOB: 24-Jul-1929 DOA: 03/02/2016 PCP: Tula Nakayama, MD    Brief Narrative:  62 yof with a hx of HTN, GERD, HLD, CVA, afib, depression, and chronic pleural effusion presents with complaints of flank pain. While in the ED her BNP was noted to be mildly elevated. Her CXR revealed enlarging bilateral pleural effusions. Pt was admitted for further evaluation of enlarging bilateral pleural effusion.  Assessment & Plan:   Principal Problem:   Pleural effusion, bilateral Active Problems:   SPINAL STENOSIS, LUMBAR   Chronic atrial fibrillation (HCC)   SOB (shortness of breath)   Pressure ulcer 1. Chronic bilateral pleural effusions, likely related to acute on chronic diastolic chf. Patient has been evaluated by [facility] in the past. Last saw Dr. Roxan Hockey on 6/27. Ultrasound of chest on 8/23 did not reveal a sizable effusion, so thoracentesis was not performed. Echocardiogram has been ordered. She has had good urine output and appears to be clinically improving. Will try and wean off oxygen. Continue IV lasix for another 24 hours and observe. 2. Afib, chronic. Anticoagulated on eliquis. Heart rate remains stable 3. Spinal stenosis. Continue gabapentin.  4. HTN. Stable. Continue outpatient regimen.  5. GERD. Continue PPI.  6. HLD. Continue statins.  7. Seizures. Continue outpatient regimen.  8. Depression. Noted.  9. Hx of senile dementia. On Aricept.   DVT prophylaxis: Eliquis Code Status: DNR Family Communication: discussed with son at the bedside Disposition Plan: Discharge home once improved    Consultants:   Palliative care  Procedures:   None  Antimicrobials:      Subjective: Feeling better. Breathing improving. No chest pain  Objective: Vitals:   03/03/16 1600 03/03/16 1607 03/03/16 2128 03/04/16 0514  BP: (!) 98/55  (!) 115/49 (!) 108/49  Pulse: 75  81 71  Resp: 20  18 18   Temp:  97.4 F (36.3 C) 98.5 F (36.9  C) 98.1 F (36.7 C)  TempSrc:  Oral Oral Oral  SpO2: 97%  97% 96%  Weight:      Height:        Intake/Output Summary (Last 24 hours) at 03/04/16 0704 Last data filed at 03/04/16 0517  Gross per 24 hour  Intake              180 ml  Output              950 ml  Net             -770 ml   Filed Weights   03/02/16 2135 03/02/16 2140 03/03/16 0500  Weight: 61.1 kg (134 lb 11.2 oz) 61.1 kg (134 lb 11.2 oz) 61.1 kg (134 lb 11.2 oz)    Examination:  General exam: Appears calm and comfortable  Respiratory system: crackles at bases. Respiratory effort normal. Cardiovascular system: S1 & S2 heard, RRR. No JVD, murmurs, rubs, gallops or clicks. 1+ pedal edema. Gastrointestinal system: Abdomen is nondistended, soft and nontender. No organomegaly or masses felt. Normal bowel sounds heard. Central nervous system: Alert and oriented. No focal neurological deficits. Extremities: Symmetric 5 x 5 power. Skin: No rashes, lesions or ulcers Psychiatry: Judgement and insight appear normal. Mood & affect appropriate.     Data Reviewed: I have personally reviewed following labs and imaging studies  CBC:  Recent Labs Lab 03/02/16 1655 03/04/16 0501  WBC 8.5 9.1  NEUTROABS 6.3  --   HGB 11.5* 11.4*  HCT 36.3 36.2  MCV 103.7* 103.1*  PLT  [id number]   Basic Metabolic Panel:  Recent Labs Lab 03/02/16 1655  NA 139  K 3.6  CL 102  CO2 34*  GLUCOSE 104*  BUN 12  CREATININE 0.50  CALCIUM 8.4*   GFR: Estimated Creatinine Clearance: 49 mL/min (by C-G formula based on SCr of 0.8 mg/dL). Liver Function Tests:  Recent Labs Lab 03/02/16 1655  AST 18  ALT 6*  ALKPHOS 79  BILITOT 0.5  PROT 6.7  ALBUMIN 3.1*    Recent Labs Lab 03/02/16 1655  LIPASE 26   No results for input(s): AMMONIA in the last 168 hours. Coagulation Profile: No results for input(s): INR, PROTIME in the last 168 hours. Cardiac Enzymes: No results for input(s): CKTOTAL, CKMB, CKMBINDEX, TROPONINI in the last  168 hours. BNP (last 3 results) No results for input(s): PROBNP in the last 8760 hours. HbA1C: No results for input(s): HGBA1C in the last 72 hours. CBG: No results for input(s): GLUCAP in the last 168 hours. Lipid Profile: No results for input(s): CHOL, HDL, LDLCALC, TRIG, CHOLHDL, LDLDIRECT in the last 72 hours. Thyroid Function Tests:  Recent Labs  03/02/16 1655  TSH 1.169   Anemia Panel: No results for input(s): VITAMINB12, FOLATE, FERRITIN, TIBC, IRON, RETICCTPCT in the last 72 hours. Sepsis Labs: No results for input(s): PROCALCITON, LATICACIDVEN in the last 168 hours.  Recent Results (from the past 240 hour(s))  MRSA PCR Screening     Status: None   Collection Time: 03/02/16 10:30 PM  Result Value Ref Range Status   MRSA by PCR NEGATIVE NEGATIVE Final    Comment:        The GeneXpert MRSA Assay (FDA approved for NASAL specimens only), is one component of a comprehensive MRSA colonization surveillance program. It is not intended to diagnose MRSA infection nor to guide or monitor treatment for MRSA infections.          Radiology Studies: Dg Chest 2 View  Result Date: 03/02/2016 CLINICAL DATA:  Shortness of breath. EXAM: CHEST  2 VIEW COMPARISON:  01/06/2016 FINDINGS: Cardiomediastinal silhouette is normal. Mediastinal contours appear intact. Calcific atherosclerotic disease of the thoracic aorta. There is no evidence of pneumothorax. There are bilateral pleural effusions, larger on the right, moderate in volume. Mild interstitial pulmonary edema. Prior right upper lobe resection stable. Exaggerated thoracic kyphosis with osteopenia and poorly visualized compression deformities of several vertebral bodies of the mid and lower thoracic spine. Soft tissues are grossly normal. Stable deformity of the left humerus. IMPRESSION: Enlarging bilateral pleural effusions. Mild pulmonary edema. Osteopenia with probable compression fractures of several vertebral bodies of the mid  and lower thoracic spine, poorly visualized. Electronically Signed   By: Fidela Salisbury M.D.   On: 03/02/2016 17:38   [place] Chest  Result Date: 03/03/2016 CLINICAL DATA:  Pleural effusion. EXAM: CHEST ULTRASOUND COMPARISON:  03/02/2016 . FINDINGS: Small pleural effusions noted bilaterally. A large enough effusion was not present for safe thoracentesis. IMPRESSION: Bilateral pleural effusions. Pleural effusions are small. No thoracentesis performed at this time. Follow-up exam can be obtained as needed. Electronically Signed   By: [place]   On: 03/03/2016 14:13        Scheduled Meds: . amLODipine  10 mg Oral Daily  . atorvastatin  20 mg Oral q1800  . busPIRone  5 mg Oral TID  . donepezil  10 mg Oral QHS  . FLUoxetine  20 mg Oral Daily  . furosemide  40 mg Intravenous BID  . gabapentin  100 mg Oral  Daily  . gabapentin  200 mg Oral Q24H  . gabapentin  300 mg Oral QHS  . levETIRAcetam  750 mg Oral BID  . metoprolol  50 mg Oral TID  . mirtazapine  30 mg Oral QHS  . pantoprazole  40 mg Oral QAC breakfast  . sodium chloride flush  3 mL Intravenous Q12H   Continuous Infusions:    LOS: 2 days    Time spent: 25 minutes     Kathie Dike, MD [facility] If 7PM-7AM, please contact night-coverage [email or web address] Password [email or web address] 03/04/2016, 7:04 AM

## 2016-03-04 NOTE — Consult Note (Signed)
Consultation Note Date: 03/04/2016   Patient Name: Erin [name]  DOB: 1930/01/10  MRN: [id number]  Age / Sex: 82 y.o., female  PCP: Fayrene Helper, MD Referring Physician: Kathie Dike, MD  Reason for Consultation: Disposition, Establishing goals of care, Hospice Evaluation and Psychosocial/spiritual support  HPI/Patient Profile: 82 y.o. female  with past medical history of Embolic stroke, a fib, arthritis, anxiety, kyphosis, osteoporosis, restless leg, sleep apnea, frequent falls, now WC/bedbound, UTI admitted on 03/02/2016 with bilateral pleural effusion.   Clinical Assessment and Goals of Care: Erin [name] greets me, making and keeping eye contact as I enter. She denies pain at this time. She tells me that her mother and brother both had heart failure, and she knows what is happening. Call to son [name], he is striving for his job at this time, called to [place], unable to meet me. He states that he knows his mother may have limited time. He talks about their care for her over the last 2 years. He talks about their need for more help. We talk about the benefits of hospice, and I share that I have spoken with his mother and she would like to take the services. I share that they are at no cost to Mrs. Lautner in the home. Son is not ready to choose a service provider at this time, CM requested to leave a listing in the patient's room. We plan to talk via phone 8/25 ad 0915.  Healthcare power of attorney NEXT OF KIN - son Robynn Dubach   SUMMARY OF RECOMMENDATIONS   continue to treat the treatable, no extraordinary measures, home with the benefits of hospice, provider undecided.   Code Status/Advance Care Planning:  DNR  Symptom Management:   Per hospitalist  Palliative Prophylaxis:   Oral Care and Turn Reposition  Additional Recommendations (Limitations, Scope, Preferences):  Continue to  treat the treatable, but no extraordinary measures. Open/desirous of hospice in the home  Psycho-social/Spiritual:   Desire for further Chaplaincy support:no  Additional Recommendations: Caregiving  Support/Resources and Education on Hospice  Prognosis:   < 6 months, likely based on chronic disease burden related to CHF, functional decline.  Discharge Planning: Patient and son desire to return home under the benefits of hospice, provider undecided at this time.      Primary Diagnoses: Present on Admission: . Chronic atrial fibrillation ([place]) . Pleural effusion, bilateral . SPINAL STENOSIS, LUMBAR . SOB (shortness of breath)   I have reviewed the medical record, interviewed the patient and family, and examined the patient. The following aspects are pertinent.  Past Medical History:  Diagnosis Date  . Anxiety   . Arthritis   . Atrial fibrillation ([place])   . Cardioembolic stroke ([place]) 123XX123  . Carpal tunnel syndrome of left wrist   . Cataract   . Esophageal dilatation   . Essential hypertension, benign   . Focal motor seizure disorder ([place]) 12/24/2014   Left leg involvment  . Focal seizures ([place]) 10/08/2015  . Frequent falls   . Gall bladder  disease   . GERD (gastroesophageal reflux disease)   . Hearing loss   . History of nuclear stress test 08/31/2012   Lexiscan cardiolite negative for ischemia  . History of stroke with current residual effects 11/20/2014  . Hyperlipidemia   . Leg edema   . Obese   . Osteoporosis   . Polyneuropathy in other diseases classified elsewhere ([place]) 10/03/2013  . Restless leg   . Seizures ([place])   . [place], post-stroke 04/04/2015    Hemiparesis, nondominant hemisphere  . Sleep apnea   . Tremor   . UTI (lower urinary tract infection)    Social History   Social History  . Marital status: Widowed    Spouse name: N/A  . Number of children: 2  . Years of education: hs   Occupational History  . Retired   .  Retired   Social  History Main Topics  . Smoking status: Never Smoker  . Smokeless tobacco: Never Used  . Alcohol use No  . Drug use: No  . Sexual activity: No   Other Topics Concern  . None   Social History Narrative   Patient is right handed.   Patient drinks caffeine about 3 times a week.   Family History  Problem Relation Age of Onset  . Diabetes Mother   . Heart disease Mother   . Stroke Mother   . Cancer Sister     KIDNEY  . Emphysema Sister   . Heart disease Brother   . Heart disease Sister   . Emphysema Sister   . Cancer Sister     BREAST  . Heart disease Brother   . Colon cancer Sister   . Alzheimer's disease Father   . Heart disease Sister   . Tremor Sister   . Tremor Brother   . Cancer Brother   . Pancreatic cancer Brother   . Diabetes Son   . Alcoholism Child    Scheduled Meds: . amLODipine  10 mg Oral Daily  . apixaban  5 mg Oral BID  . atorvastatin  20 mg Oral q1800  . busPIRone  5 mg Oral TID  . donepezil  10 mg Oral QHS  . FLUoxetine  20 mg Oral Daily  . furosemide  40 mg Intravenous BID  . gabapentin  100 mg Oral Daily  . gabapentin  200 mg Oral Q24H  . gabapentin  300 mg Oral QHS  . levETIRAcetam  750 mg Oral BID  . metoprolol  50 mg Oral TID  . mirtazapine  30 mg Oral QHS  . pantoprazole  40 mg Oral QAC breakfast  . sodium chloride flush  3 mL Intravenous Q12H   Continuous Infusions:  PRN Meds:.acetaminophen, HYDROcodone-acetaminophen, ondansetron (ZOFRAN) IV, polyethylene glycol, simethicone Medications Prior to Admission:  Prior to Admission medications   Medication Sig Start Date End Date Taking? Authorizing Provider  acetaminophen (TYLENOL) 500 MG tablet Take 1,000 mg by mouth every 6 (six) hours as needed for mild pain or moderate pain.   Yes Historical Provider, MD  ALPRAZolam Duanne Moron) 0.5 MG tablet TAKE ONE & ONE-HALF TABLETS BY MOUTH ONCE DAILY AT BEDTIME AS NEEDED FOR ANXIETY 12/10/15  Yes Kathrynn Ducking, MD  amLODipine (NORVASC) 10 MG tablet  TAKE ONE TABLET BY MOUTH ONCE DAILY 02/27/16  Yes Fayrene Helper, MD  apixaban (ELIQUIS) 5 MG TABS tablet Take 1 tablet (5 mg total) by mouth 2 (two) times daily. 10/07/15  Yes Kathrynn Ducking, MD  atorvastatin (LIPITOR) 20 MG  tablet TAKE ONE TABLET BY MOUTH ONCE DAILY 11/03/15  Yes Fayrene Helper, MD  busPIRone (BUSPAR) 5 MG tablet i tablet three times daily Patient taking differently: Take 5 mg by mouth 3 (three) times daily. i tablet three times daily 10/28/15  Yes Fayrene Helper, MD  donepezil (ARICEPT) 10 MG tablet TAKE ONE TABLET BY MOUTH ONCE DAILY AT BEDTIME 11/27/15  Yes Fayrene Helper, MD  doxylamine, Sleep, (UNISOM) 25 MG tablet Take 25 mg by mouth at bedtime. Reported on 12/26/2015   Yes Historical Provider, MD  Fluoxetine HCl, PMDD, 20 MG TABS One tablet once daily Patient taking differently: Take 20 mg by mouth daily. One tablet once daily 12/04/15  Yes Fayrene Helper, MD  gabapentin (NEURONTIN) 100 MG capsule TAKE ONE CAPSULE BY MOUTH IN THE MORNING AND TWO CAPSULES AT MIDDAY, THEN THREE CAPSULES IN THE EVENING 11/27/15  Yes Kathrynn Ducking, MD  HYDROcodone-acetaminophen (NORCO/VICODIN) 5-325 MG tablet Take 1 tablet by mouth every 6 (six) hours as needed for moderate pain. 02/09/16  Yes Kathrynn Ducking, MD  levETIRAcetam (KEPPRA) 750 MG tablet TAKE ONE TABLET BY MOUTH TWICE DAILY 09/02/15  Yes Kathrynn Ducking, MD  metoprolol (LOPRESSOR) 50 MG tablet TAKE ONE-HALF TABLET BY MOUTH THREE TIMES DAILY 11/13/15  Yes Fayrene Helper, MD  mirtazapine (REMERON) 30 MG tablet Take 1 tablet (30 mg total) by mouth at bedtime. 10/08/15  Yes Kathrynn Ducking, MD  montelukast (SINGULAIR) 10 MG tablet TAKE ONE TABLET BY MOUTH AT BEDTIME 12/05/15  Yes Fayrene Helper, MD  mupirocin ointment (BACTROBAN) 2 % Place 1 application into the nose 2 (two) times daily. 11/23/15  Yes Orvan Falconer, MD  pantoprazole (PROTONIX) 40 MG tablet Take 1 tablet (40 mg total) by mouth daily before breakfast.  11/23/15  Yes Orvan Falconer, MD  polyethylene glycol (MIRALAX / GLYCOLAX) packet Take 17 g by mouth daily. Patient taking differently: Take 17 g by mouth daily as needed for mild constipation.  05/05/15  Yes Carole Civil, MD  silver sulfADIAZINE (SILVADENE) 1 % cream Apply 1 application topically daily. 02/24/16  Yes Fayrene Helper, MD  Vitamin D, Ergocalciferol, (DRISDOL) 50000 units CAPS capsule TAKE ONE CAPSULE BY MOUTH ONCE A WEEK (EVERY 7 DAYS) 01/28/16  Yes Fayrene Helper, MD  predniSONE (DELTASONE) 20 MG tablet Take 1 tablet (20 mg total) by mouth daily with breakfast. Patient not taking: Reported on 03/02/2016 12/16/15   [place] Nakayama, MD  predniSONE (DELTASONE) 5 MG tablet Take 1 tablet (5 mg total) by mouth daily with breakfast. Patient not taking: Reported on 03/02/2016 01/06/16   [place] Nakayama, MD   Allergies  Allergen Reactions  . Codeine Hives  . Morphine And Related Itching and Other (See Comments)    Makes pt hallucinate  . Actonel [Risedronate Sodium] Other (See Comments)    Abdominal pain  . Fosamax [Alendronate Sodium] Other (See Comments)    Abdominal pian  . Losartan Other (See Comments)    Hospitalized in 06/2013 with pancreatitis, losartan is associated with increased risk of pancreatitis ,  Hence discontinued  . Nitrofurantoin Other (See Comments)    Hospitalized with pancreatitis in 06/2013. Nitrofurantoin implicated as a possible cause  . Penicillins    Review of Systems  Unable to perform ROS: Age    Physical Exam  Constitutional: She is oriented to person, place, and time. No distress.  HENT:  Head: Normocephalic and atraumatic.  Cardiovascular: Normal rate and regular rhythm.  Pulmonary/Chest: Effort normal. No respiratory distress.  Abdominal: Soft. She exhibits no distension.  Musculoskeletal:  Marked kyphosis  Neurological: She is alert and oriented to person, place, and time.  Skin: Skin is warm and dry.  Nursing note and vitals  reviewed.   Vital Signs: BP (!) 108/49 (BP Location: Left Arm)   Pulse 71   Temp 98.1 F (36.7 C) (Oral)   Resp 18   Ht 5\' 6"  (1.676 m)   Wt 61.1 kg (134 lb 11.2 oz)   SpO2 96%   BMI 21.74 kg/m  Pain Assessment: 0-10   Pain Score: 0-No pain   SpO2: SpO2: 96 % O2 Device:SpO2: 96 % O2 Flow Rate: .O2 Flow Rate (L/min): 2 L/min  IO: Intake/output summary:  Intake/Output Summary (Last 24 hours) at 03/04/16 1459 Last data filed at 03/04/16 0850  Gross per 24 hour  Intake              123 ml  Output              950 ml  Net             -827 ml    LBM: Last BM Date: 03/02/16 Baseline Weight: Weight: 53.1 kg (117 lb) Most recent weight: Weight: 61.1 kg (134 lb 11.2 oz)     Palliative Assessment/Data:   Flowsheet Rows   Flowsheet Row Most Recent Value  Intake Tab  Referral Department  Hospitalist  Unit at Time of Referral  Cardiac/Telemetry Unit  Palliative Care Primary Diagnosis  Cardiac  Date Notified  03/04/16  Palliative Care Type  New Palliative care  Reason for referral  Clarify Goals of Care, Advance Care Planning  Date of Admission  03/02/16  Date first seen by Palliative Care  03/04/16  # of days Palliative referral response time  0 Day(s)  # of days IP prior to Palliative referral  2  Clinical Assessment  Palliative Performance Scale Score  30%  Pain Max last 24 hours  Not able to report  Pain Min Last 24 hours  Not able to report  Dyspnea Max Last 24 Hours  Not able to report  Dyspnea Min Last 24 hours  Not able to report  Psychosocial & Spiritual Assessment  Palliative Care Outcomes  Patient/Family meeting held?  Yes  Who was at the meeting?  Patient and son via phone  Patient/Family wishes: Interventions discontinued/not started   Mechanical Ventilation  Palliative Care follow-up planned  -- [follow up with [facility]]      Time In: 1420 Time Out: 1510 Time Total: 50 minutes Greater than 50%  of this time was spent counseling and coordinating care  related to the above assessment and plan.  Signed by: Drue Novel, NP   Please contact Palliative Medicine Team phone at [phone] for questions and concerns.  For individual provider: See Shea Evans

## 2016-03-05 ENCOUNTER — Inpatient Hospital Stay (HOSPITAL_COMMUNITY): Payer: Medicare Other

## 2016-03-05 LAB — BASIC METABOLIC PANEL
ANION GAP: 8 (ref 5–15)
BUN: 12 mg/dL (ref 6–20)
CHLORIDE: 95 mmol/L — AB (ref 101–111)
CO2: 38 mmol/L — ABNORMAL HIGH (ref 22–32)
Calcium: 8.4 mg/dL — ABNORMAL LOW (ref 8.9–10.3)
Creatinine, Ser: 0.59 mg/dL (ref 0.44–1.00)
Glucose, Bld: 98 mg/dL (ref 65–99)
POTASSIUM: 3.4 mmol/L — AB (ref 3.5–5.1)
SODIUM: 141 mmol/L (ref 135–145)

## 2016-03-05 LAB — MAGNESIUM: MAGNESIUM: 1.6 mg/dL — AB (ref 1.7–2.4)

## 2016-03-05 MED ORDER — FUROSEMIDE 40 MG PO TABS: 40.0000 mg | ORAL_TABLET | Freq: Every day | ORAL | 0 refills | Status: AC | PRN

## 2016-03-05 NOTE — Care Management (Signed)
Patient Name Erin [name], Erin [name] ([id number]) Sex Female DOB 1929/08/27  Room Bed  A318 A318-01  Patient Demographics   Address [place] [address] Phone [phone] (Home) [phone] (Mobile) E-mail Address [email or web address]  Patient Ethnicity & Race   Ethnic Group Patient Race  Not Hispanic or Latino White or Caucasian  Emergency Contact(s)   Name Relation Home Work Mobile  [place] Son [phone]  [phone]  Lynita, Buetow [phone]  [phone]  Turner,Charlotte Niece [phone]  [phone]  Zoey, Latchford Relative [phone]  [phone]  Documents on File    Status Date Received Description  Documents for the Patient  EMR Medication Summary Not Received    EMR Problem Summary Not Received    EMR Patient Summary Not Received    [place] HIPAA NOTICE OF PRIVACY - Scanned Received 10/25/11   [place] E-Signature HIPAA Notice of Privacy Received 06/17/13   [place] E-Signature HIPAA Notice of Privacy Spanish Not Received    Driver's License Not Received    Advance Directives/Living Will/HCPOA/POA Not Received    Driver's License Not Received    Historic Radiology Documentation Not Received    Insurance Card Received 11/18/11 315  Insurance Card Received 12/24/10   [place] HIPAA NOTICE OF PRIVACY - Scanned Received 12/24/10   Insurance Card Not Received    [place] HIPAA NOTICE OF PRIVACY - Scanned Not Received    Release of Information Not Received    AMB Correspondence Not Received  ltetter/office note 06/12 SE H  AMB Correspondence Not Received  Office Note 06/12 [facility]   Financial Application Not Received    Insurance Card Not Received    AMB Correspondence Not Received  09/12 OfficeNote [facility]  AMB Correspondence Not Received  08/12 [facility]   AMB Correspondence Not Received  11/11 Card TCT   AMB Correspondence Not Received  08/12 Card TCTS  AMB Correspondence Not Received  01/13 Office  Note Deeann Saint   AMB Correspondence Not Received  03/13 [place] HIPAA NOTICE OF PRIVACY - Scanned Not Received    Insurance Card Not Received    AMB Intake Forms/Questionnaires Not Received    AMB Correspondence Not Received  10/12 [facility]   AMB Correspondence Not Received  05/13 [facility]  AMB Correspondence Not Received  04/13 [facility]  Insurance Card Not Received    AMB Correspondence Not Received  05/13 Letter [facility]   HIM ROI Authorization Not Received  PGBA Probe Audit Review  [place] HIPAA NOTICE OF PRIVACY - Scanned Received 12/03/11 315  [place] Not Received    AMB Correspondence Not Received  03/13 Office note [facility]  AMB Correspondence Not Received  06/13 Letter [facility]   AMB Correspondence Not Received  07/13 PT [facility]  AMB Correspondence Not Received  09/13 [facility]   AMB Correspondence Not Received  09/13 [facility]  AMB Correspondence Not Received  10/13 Offfice Note [facility]  [place] HIPAA NOTICE OF PRIVACY - Scanned Received 05/23/12 wmc  [place] HIPAA NOTICE OF PRIVACY - Scanned Not Received    Insurance Card Received 05/23/12 wmc  Insurance Card Not Received    AMB Correspondence Not Received  01/14 office note [facility] Care Mgm  Release of Information Not Received    Insurance Card Received 08/14/12   Insurance Card Not Received  AMB Correspondence Not Received  01/14 Office Note [facility]   AMB Correspondence Not Received  02/14 OfficeNote [facility] Care Mgmt  AMB Correspondence Not Received  02/14 letter [place]  Advanced Beneficiary Notice (ABN) Not Received    Insurance Card Received 10/16/12 MEDICARE/UNITED OF [facility]  Insurance Card Not Received    Driver's License Received 0000000   AMB Correspondence Not Received  04/14 claim form  AMB Correspondence Not Received  04/14 fall assess [facility]  AMB  Correspondence Not Received  05/14 Rehab [facility]   AMB Correspondence Not Received  05/14 Letter [facility]  [place] HIPAA NOTICE OF PRIVACY - Scanned Received 11/27/14 [place] OF PRIVACY - Scanned Not Received    Insurance Card Received 11/30/12 [place] Card Not Received    AMB Provider Completed Forms Not Received  05/14 Rx [facility]   AMB Correspondence Not Received  06/14 Referral Simpson MD,M  AMB Correspondence Not Received  07/14 OfficeNote [facility]N  AMB HH/NH/Hospice Not Received  06/14 ORDER [place] [facility]  AMB Correspondence Not Received  09/14 visit note [facility]  Insurance Card Received 04/03/13   Insurance Card Not Received    AMB Correspondence Not Received  01/14-04/14 office note [facility]  AMB Correspondence Not Received  10/14 Office Note [facility]  AMB Correspondence Not Received  10/14 office notes [facility]  AMB Correspondence Not Received  10/14 OFFICE NOTE GILBOY RN, A  AMB Correspondence Not Received  10/14 LETTER [facility]  AMB Correspondence Not Received  03/14 letters [facility]  AMB Correspondence Not Received  01/14-02/14 office note [employer] Card Not Received    Insurance Card Received 06/05/13 wmc/tcts  AMB Correspondence Not Received  12/14 Visit Note [facility]  AMB Correspondence Not Received  12/14 Clinical Note [facility]  AMB Correspondence Not Received  12/14 Clinical note [place] [employer] Card Received 11/27/14 315  Insurance Card Received 07/18/13 chmgh/nl/tst  Insurance Card Not Received  [employer]  Insurance Card Not Received    AMB Correspondence  08/04/13 12/14 [facility]  AMB Correspondence  08/05/13 01/15 [facility]  AMB Correspondence  08/28/13 02/15 clinical note [facility]  AMB Correspondence  09/28/13 03/15 Office Notes Gilboy RN C  AMB Correspondence  10/02/13 03/15 office notes Moshe Cipro MD,  Insurance Card   [place]  Insurance Card  10/03/13   AMB  Correspondence  10/02/13 03/15 Visit note [facility]  AMB Correspondence  10/12/13 03/15 clinical note Gilboy RN   AMB Correspondence  10/19/13 04/15 office notes [facility]  AMB Correspondence  01/07/14 05/15 CLINICAL NOTE NIDA MD, G  Insurance Card Received 02/14/14   AMB Correspondence  02/22/14 VISIT NOTE [facility]  E-Signature AOB Spanish Not Received    AMB Correspondence  04/29/14 OFFICE NOTE [facility]  AMB Correspondence  06/28/14 TRANSITION OF CARE [facility]  AMB Correspondence  07/15/14 CLINICAL NOTE [place] [facility]  AMB Correspondence  07/11/14 CLINICAL NOTE [place] [facility]  AMB Correspondence  07/08/14 CLINICAL NOTE  AMB Correspondence  07/09/14 CLINICIAL NOTES [facility]  AMB Correspondence  06/28/14 RX PICK-UP [place] [facility]  AMB Correspondence  07/16/14 OFFICE NOTE [place] [facility]  AMB Correspondence  07/09/14 OFFICE NOTES [facility]  AMB HH/NH/Hospice  07/11/14 ORDER  [facility]  AMB Correspondence  07/18/14 RX [place] PRIMARY [facility] SIMPSON MD,M  AMB Correspondence  07/10/14 RX [place]  AMB HH/NH/Hospice  07/10/14 POC [facility]  AMB Correspondence  07/23/14 CLINICAL NOTE [facility]  AMB HH/NH/Hospice  08/10/14 [facility]  AMB HH/NH/Hospice  08/08/14 ORDER FOR [facility]  AMB Correspondence  08/09/14 CLINICAL NOTE [place] [facility]  AMB Correspondence  08/01/14 ORDER [place] [facility]  AMB HH/NH/Hospice  08/06/14 ORDER [facility]  AMB HH/NH/Hospice  08/19/14 ORDERS [facility]  AMB Correspondence  08/05/14 RX PICKUP [place] [facility]  AMB Correspondence  08/23/14 RX [place] [facility]  AMB Correspondence  07/25/14 CHARGE ENTRY BATCH REPORT  AMB Correspondence  08/29/14 OFFICE NOTE [facility]  AMB Correspondence  09/06/14 CLINICAL NOTE  AMB  HH/NH/Hospice  08/30/14 CASE COMMUNICATION REPORT [facility]ARE PROVIDERS  AMB HH/NH/Hospice  08/25/14 ORDER [facility]  AMB HH/NH/Hospice  09/09/14 ORDERS [facility] [place]  AMB HH/NH/Hospice  09/26/14 ORDERS [facility] [place] OFFICE  AMB Correspondence  09/11/14 OFFICE NOTE [facility]  Other Photo ID Not Received    Insurance Card Received 10/02/14 MEDICARE A&B/[employer] LIFE/ROSM/2016  AMB HH/NH/Hospice  0000000 CERT/POC [facility]  AMB Correspondence  09/26/14 VISIT NOTES [place] [facility]  Release of Information  10/15/14   Consent Form   THN Consent HIPPA  Consent Form   [facility] Consent HIPPA  AMB Correspondence  10/01/14 RX [place] [facility]  AMB HH/NH/Hospice  10/07/14 ORDER [facility]  AMB Correspondence  10/21/14 RX [place] [facility]  AMB Correspondence  10/24/14 CASE COMMUNICATION REPORT HOMECARE PROVIDERS  AMB HH/NH/Hospice  10/24/14 ORDER [facility]  AMB HH/NH/Hospice  11/04/14 ORDER [facility]  Insurance Card Received 05/12/15 GNA/MEDICARE/UNITED OF [place]  AMB Correspondence  12/02/14 RX ORDER [place] [facility]  AMB Correspondence  12/17/14 RX [place] [facility]  AMB Correspondence  12/31/14 RX [place] [facility]  AMB Correspondence  01/23/15 RX [place] [facility]  AMB HH/NH/Hospice  [id number] CERTIFICATION/POC [facility]  AMB Correspondence  01/14/15 REFERRAL [place] [facility]  AMB Correspondence  03/03/15 REFERRAL [place] PRIMARY [facility]  AMB Correspondence  02/17/15 ORDER [place] [facility]  AMB Correspondence  03/10/15 RX ORDER [place] [facility]TS [facility]IC[facility]  [place] E-Signature HIPAA Notice of Privacy Signed 05/12/15 PATIENT RIGHTS SIGNED/05/12/15  AMB Correspondence  04/09/15 PATIENT DEMOGRAPHICS  AMB Correspondence  04/17/15 RX [place]  ORTHOPEDICS & SPORTS MEDICINE  Insurance Card Accepted 04/22/15 wmc  Case Management Attachment   Left Foot April 29 [email or web address]  Insurance Card Received 05/12/15 [place] [employer]05/12/15  [place] E-Signature HIPAA Notice of Privacy Received 05/12/15   AMB HH/NH/Hospice  05/16/15 OFFICE NOTES [facility]  AMB Correspondence  05/29/15 RX  ORTHO & SPORTS MEDICINE  AMB Correspondence  05/28/15 OFFICE NOTE [facility]  AMB Correspondence  07/04/15 OFFICE NOTES [facility]  [place] E-Signature HIPAA Notice of Privacy Received 07/31/15   AMB Correspondence  08/22/15 OUTPATIENT REGISTRATION [facility] [place]  Release of Information  08/22/15   [place] HIPAA NOTICE OF PRIVACY - Scanned  08/22/15   AMB Provider Completed Forms  09/19/15 FL2  AMB Correspondence  10/03/15 OFFICE NOTE [facility]  AMB Correspondence  10/03/15 OFFICE NOTES  AMB Provider Completed Forms  12/15/15 ORDER/FACE TO Lorelei Pont [facility]ARE  AMB Provider Completed Forms  0000000 CERTIFICATE OF [place] Card Received 01/06/16 wmc  [place] HIPAA NOTICE OF PRIVACY - Scanned  wmc  AMB Correspondence  12/31/15 RX [place] [facility]  AMB Correspondence (Deleted) 04/02/11 08/12 Card [facility]   AMB Correspondence (Deleted) 05/04/11 10/12 [facility]  AMB Correspondence (Deleted) 08/23/12 02/14 office note [facility]  AMB Correspondence Not Received (Deleted)  06/14 ORDER [place] [facility]  AMB Correspondence (Deleted) 05/31/13 01/14-02/14 office [place]  AMB Correspondence Not Received (Deleted)  12/14 Clinical note [facility]  AMB Correspondence (Deleted) 09/28/13 03/15 Office Notes [facility]  AMB Correspondence (Deleted) 10/02/13 03/15 Assess [facility]  AMB HH/NH/Hospice (Deleted) 08/11/14 ORDER  [facility]  AMB Correspondence (Deleted) 08/20/14 RX PICKUP [place] PRIMARY CARE  AMB Correspondence (Deleted)  07/25/14 LETTER [place]  AMB HH/NH/Hospice (Deleted) 10/24/14 ORDER [place] OFFICE  AMB Correspondence (Deleted) 12/31/14 RX [place] [facility]IC[facility]  AMB HH/NH/Hospice (Deleted) [id number] CERTIFICATION/POC [facility]  Advance Directives/Living Will/HCPOA/POA (Deleted) 08/22/15   Release of Information (Deleted) 08/22/15   AMB Provider Completed Forms (Deleted) 12/15/15 FACE TO Docia Furl  Documents for the Encounter  AOB (Assignment of Insurance Benefits) Not Received    E-signature AOB Signed 03/02/16   MEDICARE RIGHTS Not Received    E-signature Medicare Rights Signed 03/02/16   Cardiac Monitoring Strip  03/02/16   ED Patient Billing Extract   ED PB Summary  ED Patient Billing Extract   ED Encounter Summary  EKG  03/03/16   Admission Information   Attending Provider Admitting Provider Admission Type Admission Date/Time  Kathie Dike, MD Orvan Falconer, MD Emergency 03/02/16 1543  Discharge Date Hospital Service Auth/Cert Status Service Area   Internal Medicine Incomplete [place]  Unit Room/Bed Admission Status   AP-DEPT 300 A318/A318-01 Admission (Confirmed)   Admission   Complaint  Back pain  Hospital Account   Name Acct ID Class Status Primary Coverage  Erin [name], Erin [name] [id number] Inpatient Open MEDICARE - MEDICARE PART A AND B      Guarantor Account (for Hospital Account [id number])   Name Relation to Pt Service Area Active? Acct Type  Erin [name] Self CHSA Yes Personal/Family  Address Phone    [address] [place], [place] [address] [phone])        Coverage Information (for Hospital Account [id number])   1. [place] PART A AND B   F/O Payor/Plan Precert #  MEDICARE/MEDICARE PART A AND B   Subscriber Subscriber #  Erin [name], Erin [name] [id number] A  Address Phone  [address] [place], [place] [phone]   2. M[employer] OF [place]/MUTUAL OF [facility] MCR SUP   F/O Payor/Plan Precert #   MUTUAL OF [facility] SUP   Subscriber Subscriber #  Erin [name], Erin [name] [place]:[id number]  Address Phone  [address] [place] [address] [phone]

## 2016-03-05 NOTE — Care Management Note (Addendum)
Case Management Note  Patient Details  Name: Erin [name] MRN: [id number] Date of Birth: 10/21/1929    Expected Discharge Date:  03/04/16               Expected Discharge Plan:  Home/Self Care  In-House Referral:  NA  Discharge planning Services  CM Consult  Post Acute Care Choice:  Durable Medical Equipment Choice offered to:  Adult Children  DME Arranged:  Other see comment (hoyer lift) DME Agency:HH Arranged:    HH Agency:     Status of Service:  In process, will continue to follow  If discussed at Long Length of Stay Meetings, dates discussed:    Additional Comments: Patient discharging home today home with hospice services. [facility] aware patient is discharging today and will contact son [name]. [place] will order [facility] lift for patient. EMS transport arranged at 3:00. Liona Wengert, Chauncey [place], RN 03/05/2016, 1:22 PM

## 2016-03-05 NOTE — Progress Notes (Signed)
Discharged home with hospice services, out in stable condition, transported by [facility].

## 2016-03-05 NOTE — Discharge Summary (Signed)
Physician Discharge Summary  Erin [name] PPI:[id number] DOB: 12-09-1929 DOA: 03/02/2016  PCP: Tula Nakayama, MD  Admit date: 03/02/2016 Discharge date: 03/05/2016  Admitted From: home Disposition:  home  Recommendations for Outpatient Follow-up:  1. Follow up with PCP in 1-2 weeks 2. Patient will be discharged home with hospice services  Home Health: home with hospice Equipment/Devices: oxygen 3 L  Discharge Condition: stable CODE STATUS: DNR Diet recommendation: Heart Healthy  Brief/Interim Summary: 1. Chronic bilateral pleural effusions, likely related to acute on chronic diastolic CHF. Patient has been evaluated by [facility] in the past for persistent right sided effusion. Last saw Dr. Roxan Hockey on 6/27. Ultrasound of chest on 8/23 did not reveal a sizable effusion, so thoracentesis was not performed. She has had good urine output with intravenous lasix, but appears to be developing a contraction alkalosis. She will be transitioned to oral lasix. She will need to continue on supplemental oxygen. 2. Afib, chronic. Anticoagulated on eliquis. Heart rate remains stable.  3. Spinal stenosis. Continue gabapentin.  4. HTN. Blood pressures have trended down. Continue outpatient regimen.  5. GERD. Continue PPI.  6. HLD. Continue statins.  7. Seizures. Continue outpatient regimen.  8. Depression. Noted.  9. Hx of senile dementia. On Aricept.  10. Discussion. Patient's son met with palliative care to discuss goals of care. It was reported that patient has had a functional decline over the last few months. Family has elected to pursue hospice care at home.  Discharge Diagnoses:  Principal Problem:   Pleural effusion, bilateral Active Problems:   SPINAL STENOSIS, LUMBAR   Chronic atrial fibrillation (HCC)   SOB (shortness of breath)   Pressure ulcer   Palliative care encounter   Goals of care, counseling/discussion   Hypoxemia   Acute on chronic diastolic CHF (congestive heart  failure) [facility])    Discharge Instructions  Discharge Instructions    Diet - low sodium heart healthy    Complete by:  As directed   Increase activity slowly    Complete by:  As directed       Medication List    STOP taking these medications   predniSONE 20 MG tablet Commonly known as:  DELTASONE   predniSONE 5 MG tablet Commonly known as:  DELTASONE     TAKE these medications   acetaminophen 500 MG tablet Commonly known as:  TYLENOL Take 1,000 mg by mouth every 6 (six) hours as needed for mild pain or moderate pain.   ALPRAZolam 0.5 MG tablet Commonly known as:  XANAX TAKE ONE & ONE-HALF TABLETS BY MOUTH ONCE DAILY AT BEDTIME AS NEEDED FOR ANXIETY   amLODipine 10 MG tablet Commonly known as:  NORVASC TAKE ONE TABLET BY MOUTH ONCE DAILY   apixaban 5 MG Tabs tablet Commonly known as:  ELIQUIS Take 1 tablet (5 mg total) by mouth 2 (two) times daily.   atorvastatin 20 MG tablet Commonly known as:  LIPITOR TAKE ONE TABLET BY MOUTH ONCE DAILY   busPIRone 5 MG tablet Commonly known as:  BUSPAR i tablet three times daily What changed:  how much to take  how to take this  when to take this  additional instructions   donepezil 10 MG tablet Commonly known as:  ARICEPT TAKE ONE TABLET BY MOUTH ONCE DAILY AT BEDTIME   doxylamine (Sleep) 25 MG tablet Commonly known as:  UNISOM Take 25 mg by mouth at bedtime. Reported on 12/26/2015   Fluoxetine HCl (PMDD) 20 MG Tabs One tablet once daily What changed:  how much to take  how to take this  when to take this  additional instructions   furosemide 40 MG tablet Commonly known as:  LASIX Take 1 tablet (40 mg total) by mouth daily as needed for fluid.   gabapentin 100 MG capsule Commonly known as:  NEURONTIN TAKE ONE CAPSULE BY MOUTH IN THE MORNING AND TWO CAPSULES AT MIDDAY, THEN THREE CAPSULES IN THE EVENING   HYDROcodone-acetaminophen 5-325 MG tablet Commonly known as:  NORCO/VICODIN Take 1 tablet by  mouth every 6 (six) hours as needed for moderate pain.   levETIRAcetam 750 MG tablet Commonly known as:  KEPPRA TAKE ONE TABLET BY MOUTH TWICE DAILY   metoprolol 50 MG tablet Commonly known as:  LOPRESSOR TAKE ONE-HALF TABLET BY MOUTH THREE TIMES DAILY   mirtazapine 30 MG tablet Commonly known as:  REMERON Take 1 tablet (30 mg total) by mouth at bedtime.   montelukast 10 MG tablet Commonly known as:  SINGULAIR TAKE ONE TABLET BY MOUTH AT BEDTIME   mupirocin ointment 2 % Commonly known as:  BACTROBAN Place 1 application into the nose 2 (two) times daily.   pantoprazole 40 MG tablet Commonly known as:  PROTONIX Take 1 tablet (40 mg total) by mouth daily before breakfast.   polyethylene glycol packet Commonly known as:  MIRALAX / GLYCOLAX Take 17 g by mouth daily. What changed:  when to take this  reasons to take this   silver sulfADIAZINE 1 % cream Commonly known as:  SILVADENE Apply 1 application topically daily.   Vitamin D (Ergocalciferol) 50000 units Caps capsule Commonly known as:  DRISDOL TAKE ONE CAPSULE BY MOUTH ONCE A WEEK (EVERY 7 DAYS)       Allergies  Allergen Reactions  . Codeine Hives  . Morphine And Related Itching and Other (See Comments)    Makes pt hallucinate  . Actonel [Risedronate Sodium] Other (See Comments)    Abdominal pain  . Fosamax [Alendronate Sodium] Other (See Comments)    Abdominal pian  . Losartan Other (See Comments)    Hospitalized in 06/2013 with pancreatitis, losartan is associated with increased risk of pancreatitis ,  Hence discontinued  . Nitrofurantoin Other (See Comments)    Hospitalized with pancreatitis in 06/2013. Nitrofurantoin implicated as a possible cause  . Penicillins     Consultations:  Palliative care   Procedures/Studies: Dg Chest 2 View  Result Date: 03/02/2016 CLINICAL DATA:  Shortness of breath. EXAM: CHEST  2 VIEW COMPARISON:  01/06/2016 FINDINGS: Cardiomediastinal silhouette is normal.  Mediastinal contours appear intact. Calcific atherosclerotic disease of the thoracic aorta. There is no evidence of pneumothorax. There are bilateral pleural effusions, larger on the right, moderate in volume. Mild interstitial pulmonary edema. Prior right upper lobe resection stable. Exaggerated thoracic kyphosis with osteopenia and poorly visualized compression deformities of several vertebral bodies of the mid and lower thoracic spine. Soft tissues are grossly normal. Stable deformity of the left humerus. IMPRESSION: Enlarging bilateral pleural effusions. Mild pulmonary edema. Osteopenia with probable compression fractures of several vertebral bodies of the mid and lower thoracic spine, poorly visualized. Electronically Signed   By: Fidela Salisbury M.D.   On: 03/02/2016 17:38   [place] Chest  Result Date: 03/03/2016 CLINICAL DATA:  Pleural effusion. EXAM: CHEST ULTRASOUND COMPARISON:  03/02/2016 . FINDINGS: Small pleural effusions noted bilaterally. A large enough effusion was not present for safe thoracentesis. IMPRESSION: Bilateral pleural effusions. Pleural effusions are small. No thoracentesis performed at this time. Follow-up exam can be obtained as needed.  Electronically Signed   By: Marcello Moores  Register   On: 03/03/2016 14:13        Subjective: Had a rough night. Complaining of back pain at this time.  Discharge Exam: Vitals:   03/04/16 2100 03/05/16 0503  BP: 121/70 (!) 109/46  Pulse:  76  Resp: 18   Temp: 99.4 F (37.4 C) 98.7 F (37.1 C)   Vitals:   03/03/16 2128 03/04/16 0514 03/04/16 2100 03/05/16 0503  BP: (!) 115/49 (!) 108/49 121/70 (!) 109/46  Pulse: 81 71  76  Resp: _0 Temp: 98.5 F (36.9 C) 98.1 F (36.7 C) 99.4 F (37.4 C) 98.7 F (37.1 C)  TempSrc: Oral Oral Oral Oral  SpO2: 97% 96% 93% (!) 88%  Weight:      Height:        General: Pt is alert, awake, not in acute distress Cardiovascular: RRR, S1/S2 +, no rubs, no gallops Respiratory: Coarse breath  sounds at bases, no wheezing, no rhonchi Abdominal: Soft, NT, ND, bowel sounds + Extremities: trace edema, no cyanosis    The results of significant diagnostics from this hospitalization (including imaging, microbiology, ancillary and laboratory) are listed below for reference.     Microbiology: Recent Results (from the past 240 hour(s))  MRSA PCR Screening     Status: None   Collection Time: 03/02/16 10:30 PM  Result Value Ref Range Status   MRSA by PCR NEGATIVE NEGATIVE Final    Comment:        The GeneXpert MRSA Assay (FDA approved for NASAL specimens only), is one component of a comprehensive MRSA colonization surveillance program. It is not intended to diagnose MRSA infection nor to guide or monitor treatment for MRSA infections.      Labs: BNP (last 3 results)  Recent Labs  03/02/16 1655  BNP 191.4*   Basic Metabolic Panel:  Recent Labs Lab 03/02/16 1655 03/04/16 0501 03/05/16 0554  NA 139 141 141  K 3.6 3.1* 3.4*  CL 102 96* 95*  CO2 34* 35* 38*  GLUCOSE 104* 127* 98  BUN _1 CREATININE 0.50 0.54 0.59  CALCIUM 8.4* 8.6* 8.4*  MG  --   --  1.6*   Liver Function Tests:  Recent Labs Lab 03/02/16 1655  AST 18  ALT 6*  ALKPHOS 79  BILITOT 0.5  PROT 6.7  ALBUMIN 3.1*    Recent Labs Lab 03/02/16 1655  LIPASE 26   No results for input(s): AMMONIA in the last 168 hours. CBC:  Recent Labs Lab 03/02/16 1655 03/04/16 0501  WBC 8.5 9.1  NEUTROABS 6.3  --   HGB 11.5* 11.4*  HCT 36.3 36.2  MCV 103.7* 103.1*  PLT 271 274   Cardiac Enzymes: No results for input(s): CKTOTAL, CKMB, CKMBINDEX, TROPONINI in the last 168 hours. BNP: Invalid input(s): POCBNP CBG: No results for input(s): GLUCAP in the last 168 hours. D-Dimer No results for input(s): DDIMER in the last 72 hours. Hgb A1c No results for input(s): HGBA1C in the last 72 hours. Lipid Profile No results for input(s): CHOL, HDL, LDLCALC, TRIG, CHOLHDL, LDLDIRECT in the  last 72 hours. Thyroid function studies  Recent Labs  03/02/16 1655  TSH 1.169   Anemia work up No results for input(s): VITAMINB12, FOLATE, FERRITIN, TIBC, IRON, RETICCTPCT in the last 72 hours. Urinalysis    Component Value Date/Time   COLORURINE YELLOW 03/02/2016 1801   APPEARANCEUR CLEAR 03/02/2016 1801   LABSPEC 1.025 03/02/2016 1801  PHURINE 6.0 03/02/2016 1801   GLUCOSEU NEGATIVE 03/02/2016 1801   HGBUR MODERATE (A) 03/02/2016 1801   BILIRUBINUR NEGATIVE 03/02/2016 1801   BILIRUBINUR neg 03/31/2015 1450   KETONESUR NEGATIVE 03/02/2016 1801   PROTEINUR TRACE (A) 03/02/2016 1801   UROBILINOGEN 0.2 05/26/2015 1400   NITRITE NEGATIVE 03/02/2016 1801   LEUKOCYTESUR NEGATIVE 03/02/2016 1801   Sepsis Labs Invalid input(s): PROCALCITONIN,  WBC,  LACTICIDVEN Microbiology Recent Results (from the past 240 hour(s))  MRSA PCR Screening     Status: None   Collection Time: 03/02/16 10:30 PM  Result Value Ref Range Status   MRSA by PCR NEGATIVE NEGATIVE Final    Comment:        The GeneXpert MRSA Assay (FDA approved for NASAL specimens only), is one component of a comprehensive MRSA colonization surveillance program. It is not intended to diagnose MRSA infection nor to guide or monitor treatment for MRSA infections.      Time coordinating discharge: Over 30 minutes  SIGNED:   Kathie Dike, MD  [facility] 03/05/2016, 11:44 AM Pager [phone]  If 7PM-7AM, please contact night-coverage [email or web address] Password [email or web address]

## 2016-03-05 NOTE — Progress Notes (Signed)
Daily Progress Note   Patient Name: Erin [name]       Date: 03/05/2016 DOB: [birth year]  Age: 82 y.o. MRN#: [id number] Attending Physician: Kathie Dike, MD Primary Care Physician: Tula Nakayama, MD Admit Date: 03/02/2016  Reason for Consultation/Follow-up: Disposition, Establishing goals of care, Interfamily conflict and Psychosocial/spiritual support  Subjective: Erin [name] is resting quietly in bed, she greets me and makes and keeps eye contact as I enter. No family at bedside at this time. She tells me that she had a "horrible" night, that she was hallucinating. She is unable to give details, other than to say that she was frightened. I reassure her the best I can. She states that she feels these hallucinations are related to a new medication she is been given. We talk about going home with hospice, and she is still accepting of these services. Call to son, Erin [name]. We talk about hospice services in the home. Family elects hospice of [facility], as they feel they may have need for hospice home in the future. Prescilla Sours is on speakerphone. And shares that Erin [name] has "gone downhill so much over the last month". She shares that she is no longer able to walk. Threasa Beards talks about helping Erin [name] to the time, that this took 2 people. I share that she may no longer be able to take a bath/shower in the top, but it will be okay to just give her a bed bath 3 times per week. Family states that Erin [name] is also sleeping more, and that they knows this is a sign that her in time is coming. They talk about her having visions of people, but she cannot identify these people. They feel that she is likely seeing angels. Threasa Beards shares that Erin [name] has stated on numerous occasions that  she is, "ready to move on". Their ultimate goal is comfort and dignity. We discuss the benefits of hospice in detail, but I am sure, as with most people, they will continue to need education regarding the benefits. They need a Hoyer lift, but have a hospital bed. She will go home with oxygen.   Length of Stay: 3  Current Medications: Scheduled Meds:  . amLODipine  10 mg Oral Daily  . apixaban  5 mg Oral BID  . atorvastatin  20 mg Oral  q1800  . busPIRone  5 mg Oral TID  . donepezil  10 mg Oral QHS  . FLUoxetine  20 mg Oral Daily  . furosemide  40 mg Intravenous BID  . gabapentin  100 mg Oral Daily  . gabapentin  200 mg Oral Q24H  . gabapentin  300 mg Oral QHS  . levETIRAcetam  750 mg Oral BID  . metoprolol  50 mg Oral TID  . mirtazapine  30 mg Oral QHS  . pantoprazole  40 mg Oral QAC breakfast  . sodium chloride flush  3 mL Intravenous Q12H    Continuous Infusions:    PRN Meds: acetaminophen, HYDROcodone-acetaminophen, ondansetron (ZOFRAN) IV, polyethylene glycol, simethicone  Physical Exam  Constitutional: She is oriented to person, place, and time. No distress.  Frail, chronically ill appearing  HENT:  Head: Normocephalic and atraumatic.  Cardiovascular: Normal rate.   Pulmonary/Chest: Effort normal. No respiratory distress.  Abdominal: Soft. She exhibits no distension.  Musculoskeletal:  Marked kyphosis  Neurological: She is alert and oriented to person, place, and time.  Skin: Skin is warm and dry.  Nursing note and vitals reviewed.           Vital Signs: BP (!) 109/46 (BP Location: Left Arm)   Pulse 76   Temp 98.7 F (37.1 C) (Oral)   Resp 18   Ht 5\' 6"  (1.676 m)   Wt 61.1 kg (134 lb 11.2 oz)   SpO2 (!) 88%   BMI 21.74 kg/m  SpO2: SpO2: (!) 88 % O2 Device: O2 Device: Nasal Cannula O2 Flow Rate: O2 Flow Rate (L/min): 3 L/min  Intake/output summary:  Intake/Output Summary (Last 24 hours) at 03/05/16 1010 Last data filed at 03/05/16 0900  Gross per 24  hour  Intake              240 ml  Output              700 ml  Net             -460 ml   LBM: Last BM Date: 03/02/16 Baseline Weight: Weight: 53.1 kg (117 lb) Most recent weight: Weight: 61.1 kg (134 lb 11.2 oz)       Palliative Assessment/Data:    Flowsheet Rows   Flowsheet Row Most Recent Value  Intake Tab  Referral Department  Hospitalist  Unit at Time of Referral  Cardiac/Telemetry Unit  Palliative Care Primary Diagnosis  Cardiac  Date Notified  03/04/16  Palliative Care Type  New Palliative care  Reason for referral  Clarify Goals of Care, Advance Care Planning  Date of Admission  03/02/16  Date first seen by Palliative Care  03/04/16  # of days Palliative referral response time  0 Day(s)  # of days IP prior to Palliative referral  2  Clinical Assessment  Palliative Performance Scale Score  30%  Pain Max last 24 hours  Not able to report  Pain Min Last 24 hours  Not able to report  Dyspnea Max Last 24 Hours  Not able to report  Dyspnea Min Last 24 hours  Not able to report  Psychosocial & Spiritual Assessment  Palliative Care Outcomes  Patient/Family meeting held?  Yes  Who was at the meeting?  Patient and son via phone  Patient/Family wishes: Interventions discontinued/not started   Mechanical Ventilation  Palliative Care follow-up planned  -- [follow up with [facility]]      Patient Active Problem List   Diagnosis Date Noted  . Palliative care  encounter   . Goals of care, counseling/discussion   . Hypoxemia   . Acute on chronic diastolic CHF (congestive heart failure) ([place])   . Pressure ulcer 03/03/2016  . SOB (shortness of breath) 03/02/2016  . Nocturnal hypoxemia 02/09/2016  . Depression 12/28/2015  . Grief at loss of child 10/30/2015  . Benign essential tremor 07/31/2015  . Sequela, post-stroke 04/04/2015  . Pigmented skin lesion 03/31/2015  . Pleural effusion, bilateral 03/11/2015  . Focal motor seizure disorder ([place]) 12/24/2014  . History of stroke with  current residual effects 11/20/2014  . Sleep apnea 07/14/2014  . Senile dementia with behavioral disturbance 07/14/2014  . Moderate tricuspid regurgitation 07/14/2014  . Left arm weakness 07/14/2014  . Hyperlipidemia LDL goal <70 07/08/2014  . Cardioembolic stroke ([place]) [id number]  . Anxiety state 05/01/2014  . Cerebral embolism with cerebral infarction ([place]) 03/23/2014  . Allergic rhinitis 10/15/2013  . Insomnia 10/15/2013  . Polyneuropathy in other diseases classified elsewhere ([place]) 10/03/2013  . Chronic atrial fibrillation ([place]) 04/09/2013  . DNR (do not resuscitate) 02/13/2013  . Hearing loss 02/13/2013  . Muscle weakness (generalized) 11/24/2012  . Unspecified constipation 09/05/2012  . FH: colon cancer 02/23/2012  . Other vitamin B12 deficiency anemia 11/08/2011  . Essential and other specified forms of tremor 11/08/2011  . Osteoporosis, senile   . Prediabetes 03/16/2011  . SPINAL STENOSIS, LUMBAR 11/20/2009  . SCIATICA 11/13/2009  . OSTEOARTHRITIS, KNEE, RIGHT 09/11/2009  . UNSTEADY GAIT 09/11/2009  . Essential hypertension, benign 01/02/2009  . GERD 01/02/2009  . HEMORRHAGE OF RECTUM AND ANUS 01/02/2009  . ARTHRITIS, LEFT FOOT 07/17/2007    Palliative Care Assessment & Plan   Patient Profile: 82 y.o. female  with past medical history of Embolic stroke, a fib, arthritis, anxiety, kyphosis, osteoporosis, restless leg, sleep apnea, frequent falls, now WC/bedbound, UTI admitted on 03/02/2016 with bilateral pleural effusion.   Assessment: As above  Recommendations/Plan:  home with the services of hospice of [facility]. Continue to treat the treatable but no extraordinary measures, focus on comfort and dignity.  Goals of Care and Additional Recommendations:  Limitations on Scope of Treatment: No CPR/intubation  Code Status:    Code Status Orders        Start     Ordered   03/02/16 2121  Do not attempt resuscitation (DNR)  Continuous    Question Answer  Comment  In the event of cardiac or respiratory ARREST Do not call a "code blue"   In the event of cardiac or respiratory ARREST Do not perform Intubation, CPR, defibrillation or ACLS   In the event of cardiac or respiratory ARREST Use medication by any route, position, wound care, and other measures to relive pain and suffering. May use oxygen, suction and manual treatment of airway obstruction as needed for comfort.      03/02/16 2120    Code Status History    Date Active Date Inactive Code Status Order ID Comments User Context   11/22/2015  6:19 AM 11/23/2015  8:17 PM DNR [id number]  Rise Patience, MD Inpatient   07/14/2014  3:18 PM 07/15/2014  3:46 PM DNR [id number]  Annita Brod, MD ED   07/14/2014  2:13 PM 07/14/2014  3:18 PM DNR [id number]  Orlie Dakin, MD ED   03/28/2014  7:17 PM 04/17/2014  2:09 PM Full Code [id number]  Cathlyn Parsons, PA-C Inpatient   03/26/2014 11:48 AM 03/28/2014  7:17 PM DNR [id number]  Rosalin Hawking, MD Inpatient   03/25/2014 10:21 PM  03/26/2014 11:48 AM Full Code [id number]  Marianna Payment, MD Inpatient   03/20/2014 10:32 PM 03/25/2014 10:21 PM DNR [id number]  Phillips Grout, MD Inpatient   03/20/2014  8:49 PM 03/20/2014 10:32 PM Full Code [id number]  Phillips Grout, MD Inpatient   06/17/2013  3:10 PM 06/18/2013  4:13 PM DNR [id number]  Janace Aris, RN Inpatient   06/17/2013  2:59 PM 06/17/2013  3:10 PM Full Code [id number]  Jonetta Osgood, MD Inpatient   06/17/2013  2:22 PM 06/17/2013  2:59 PM DNR [id number]  Jonetta Osgood, MD ED    Advance Directive Documentation   Flowsheet Row Most Recent Value  Type of Advance Directive  Healthcare Power of Attorney, Living will  Pre-existing out of facility DNR order (yellow form or pink MOST form)  No data  "MOST" Form in Place?  No data       Prognosis:   < 6 months, likely less based on chronic disease burden, functional decline over the last month, to where she is basically wheelchair/bedbound, and patient/ family desire  to focus on comfort and dignity.  Discharge Planning:  Home with the services of hospice of [place] was discussed with nursing staff, case manager, social worker, and Dr. Roderic Palau.  Thank you for allowing the Palliative Medicine Team to assist in the care of this patient.   Time In: 0915 Time Out: 1010 Total Time 55 minutes  Prolonged Time Billed  yes       Greater than 50%  of this time was spent counseling and coordinating care related to the above assessment and plan.  Drue Novel, NP  Please contact Palliative Medicine Team phone at [phone] for questions and concerns.

## 2016-03-05 NOTE — Care Management Important Message (Signed)
Important Message  Patient Details  Name: EVOLETT KOTLARZ MRN: [id number] Date of Birth: 05/28/1930   Medicare Important Message Given:  Yes    Taylan Marez, Chauncey [place], RN 03/05/2016, 9:01 AM

## 2016-03-06 ENCOUNTER — Other Ambulatory Visit: Payer: Self-pay | Admitting: Family Medicine

## 2016-03-08 ENCOUNTER — Ambulatory Visit: Payer: Self-pay | Admitting: *Deleted

## 2016-03-08 ENCOUNTER — Other Ambulatory Visit: Payer: Self-pay | Admitting: *Deleted

## 2016-03-08 ENCOUNTER — Telehealth: Payer: Self-pay | Admitting: Family Medicine

## 2016-03-08 NOTE — Telephone Encounter (Signed)
Would [name] agree to be her Primary Care for Hospice care in her home? Please advise? Was released home Friday 03/05/16

## 2016-03-08 NOTE — Telephone Encounter (Signed)
Yes, pls fax orders

## 2016-03-08 NOTE — Telephone Encounter (Signed)
Orders faxed

## 2016-03-08 NOTE — Patient Outreach (Signed)
This covering RNCM noted patient discharged home with Hospice. Will notify assigned RNCM and LCSW of discharge disposition. Plan to report and sign off case. Royetta Crochet. Laymond Purser, RN, BSN, [place] [phone]

## 2016-03-09 ENCOUNTER — Encounter: Payer: Self-pay | Admitting: *Deleted

## 2016-03-09 ENCOUNTER — Encounter: Payer: Self-pay | Admitting: Licensed Clinical Social Worker

## 2016-03-09 ENCOUNTER — Other Ambulatory Visit: Payer: Self-pay | Admitting: Licensed Clinical Social Worker

## 2016-03-09 ENCOUNTER — Other Ambulatory Visit: Payer: Self-pay | Admitting: *Deleted

## 2016-03-09 NOTE — Patient Outreach (Signed)
Assessment:  CSW received phone call from [place] that client was now under [facility] [place] [place] for in home Hospice support.  CSW also spoke via phone with RN Erin [name] on 03/09/16 about fact that client was now receiving in home Hospice Care with [facility] [place], [place].  Due to fact that client is now under [place], CSW is discharging client from [place] services on 03/09/16.  At this time, RN Erin [name] is still workiing with client/family at request of family.  Client has strong family support.    Plan:  CSW is discharging Erin [name] on 03/09/16 from [place] services since client is now under care with [facility] [place] [place] to inform Erin [name] that Erin [name] discharged client on 03/09/16 from [place] services.  CSW to fax letter to Erin [name] informing Erin [name] that Erin [name] discharged client from [place] services only on 03/09/16.    Erin [name].Erin [name] MSW, LCSW Licensed Clinical Social Worker [facility] Care Management [phone]

## 2016-03-09 NOTE — Patient Outreach (Addendum)
[facility]) Care Management  03/09/2016  Florinda Taflinger [facility] 07-05-1930 [id number]  I spoke with Mr. Xara Paulding, son/HCPOA/primary caregiver for Mrs. Gruenhagen. He reports that Mrs. Mcclurg was discharged from the hospital to home and is receiving Hospice Care in the home. He is pleased with the care Mrs. Wrightson is receiving. He had questions about medication and equipment coverage under the Medicare Hospice benefit. We discussed this at length. He anticipates delivery of needed DME today.   Plan: Typically, [facility] Care Management discontinues active case management of patients as they enter Hospice care. Given [facility] Care Management's long history with Mrs. Haff and per the request of her and her family, I will follow for a bit longer, deferring to the Hospice team and their expertise but being available to Mrs. Giangrande and her family for support and transitional needs.   [facility] CM Care Plan Problem One   Flowsheet Row Most Recent Value  Care Plan Problem One  Acute Health Condition (Skin Lesion)  Role Documenting the Problem One  Care Management Coordinator  Care Plan for Problem One  Active  [facility] Long Term Goal (31-90 days)  Over the next 31 days, patient and/or family caregivers will verbalize understanding of plan of care for treatment of skin condition  [facility] Long Term Goal Start Date  01/22/16  [facility] Long Term Goal Met Date  03/09/16  Interventions for Problem One Long Term Goal  Hospice Care in Place  [facility] CM Short Term Goal #1 (0-30 days)  Over the next 7 days, patient's caregivers will apply ointment/dressing as prescribed and will call for new/worsened skin condition  [facility] CM Short Term Goal #1 Start Date  02/03/16  [facility] CM Short Term Goal #1 Met Date  03/09/16  Interventions for Short Term Goal #1  Hospice team directing skin care regimen    [facility] CM Care Plan Problem Two   Flowsheet Row Most Recent Value  Care Plan Problem Two  Transition and Care Coordination Need related to in  home hospice care  Role Documenting the Problem Two  Care Management Coordinator  Care Plan for Problem Two  Active  Interventions for Problem Two Long Term Goal   Discussed role of hospice care and role of [facility] Care Management with family members, explaining that [facility] CM will defer to Hospice for Oversight and management of medical problems and needs  [facility]N Long Term Goal (31-90) days  Over the next 31 days, patient/family will verbalize understanding of plan of care and receipt of needed resources as outlined by hospice team  [facility] Long Term Goal Start Date  03/09/16  [facility] CM Short Term Goal #1 (0-30 days)  Over the next 30 days, patient/family will verbalize understanding of plans related to symptom management including how to reach hospice nurse and provider when needed  [facility] CM Short Term Goal #1 Start Date  03/09/16  Interventions for Short Term Goal #2   Discussed with son role of hospice nurse and provider for symptom management  [facility] CM Short Term Goal #2 (0-30 days)  Over the next 3 days, patient's son will verbalize receipt of needed DME in the home  [facility] CM Short Term Goal #2 Start Date  03/09/16  Interventions for Short Term Goal #2  Discussed with patient's son anticipated DME receipt and what to do if DME does not arrive as scheduled     [facility] Management  [phone]

## 2016-03-10 NOTE — Progress Notes (Signed)
Pneumonia was treated with antibiotics and determined to not be a factor in subsequent respiratory problems.

## 2016-03-11 ENCOUNTER — Other Ambulatory Visit: Payer: Self-pay | Admitting: Neurology

## 2016-03-11 ENCOUNTER — Telehealth: Payer: Self-pay

## 2016-03-11 MED ORDER — FENTANYL 25 MCG/HR TD PT72: 25.0000 ug | MEDICATED_PATCH | TRANSDERMAL | 0 refills | Status: AC

## 2016-03-11 MED ORDER — HYDROCODONE-ACETAMINOPHEN 5-325 MG PO TABS: 1.0000 | ORAL_TABLET | Freq: Four times a day (QID) | ORAL | 0 refills | Status: AC | PRN

## 2016-03-11 NOTE — Telephone Encounter (Signed)
Last OV was in March w/ follow-up scheduled in October. Most recent refill 02/09/16.

## 2016-03-11 NOTE — Telephone Encounter (Signed)
Grandson [name] called to request refill of HYDROcodone-acetaminophen (NORCO/VICODIN) 5-325 MG tablet

## 2016-03-11 NOTE — Telephone Encounter (Signed)
Order placed

## 2016-03-11 NOTE — Telephone Encounter (Signed)
Rx printed, signed, up front for pick-up. 

## 2016-03-12 ENCOUNTER — Other Ambulatory Visit: Payer: Self-pay | Admitting: Family Medicine

## 2016-03-12 DIAGNOSIS — F411 Generalized anxiety disorder: Secondary | ICD-10-CM

## 2016-03-15 DIAGNOSIS — R5383 Other fatigue: Secondary | ICD-10-CM | POA: Insufficient documentation

## 2016-03-15 NOTE — Assessment & Plan Note (Signed)
Worsened chest wall pain in pt with pleura;l effusion of unknown etiology, she is to go to ED for further eval, will likely require hospitalization

## 2016-03-15 NOTE — Assessment & Plan Note (Signed)
Pt repeatedly states that she "is tired" and seems less motivated/ interested in f/u with chronic issues, may  Be transitioned to hospice if deemed appropriate, her medical conditons absolutely make her eligible, will f/u post hospital if not done at that time

## 2016-03-15 NOTE — Assessment & Plan Note (Signed)
Chronic leg ulcer non healing, cleaned and dry gauze applied, no purulent drainage noted, has appt with wound clinic, future

## 2016-03-15 NOTE — Assessment & Plan Note (Signed)
Hypoxic on room air at rest, known pleural effusion, apparently worsening , needs evaluation at ED, pt agreeable, she is taken over by her family. Ed Doc aware

## 2016-03-15 NOTE — Progress Notes (Signed)
   Erin [name]     MRN: [id number]      DOB: [birth year]   HPI Erin [name] is here accompanied by her grand son and her caregiver, with 4 day h/o increased fatigue, poor appetite and increased chest wall pain. Pt states she is "tired" and "wants to go home" C/o sore on right leg still not healed, no pus noted.  ROS Denies recent fever or chills. Denies sinus pressure, nasal congestion, ear pain or sore throat. Denies chest congestion, productive cough or wheezing. Denies chest pains, palpitations and leg swelling  Denies dysuria, frequency, hesitancy or incontinence. C/o chronic joint pain,  and limitation in mobility. Denies headaches, seizures, numbness, or tingling.  PE  BP 120/82   Pulse (!) 122   Resp 18   SpO2 90%   Patient alert , weak and chronically ill appearing and in mild cardiopulmonary distress at rest   HEENT: No facial asymmetry, EOMI,   oropharynxmoist.  Neck decreased ROM, marked kyphosis, no JVD, no mass.  Chest: decreased air entry bilaterally with basilar crackles, no wheezes  CVS: S1, S2  no S3.Regular rate.Systolic murmur  ABD: Soft non tender.   Ext: No edema  MS: Markedly reduced  ROM spine, shoulders, hips and knees.  Skin: non healing ulcer on RLE  Psych: Good eye contact, . Memory intact not anxious mildly depressed appearing.  CNS: CN 2-12 intact,decreased power and tone throughout  Assessment & Plan Hypoxemia Hypoxic on room air at rest, known pleural effusion, apparently worsening , needs evaluation at ED, pt agreeable, she is taken over by her family. Ed Doc aware  Chest wall pain Worsened chest wall pain in pt with pleura;l effusion of unknown etiology, she is to go to ED for further eval, will likely require hospitalization  Pigmented skin lesion Chronic leg ulcer non healing, cleaned and dry gauze applied, no purulent drainage noted, has appt with wound clinic, future  DNR (do not resuscitate) Pt repeatedly states that she  "is tired" and seems less motivated/ interested in f/u with chronic issues, may  Be transitioned to hospice if deemed appropriate, her medical conditons absolutely make her eligible, will f/u post hospital if not done at that time

## 2016-03-16 ENCOUNTER — Other Ambulatory Visit: Payer: Self-pay | Admitting: *Deleted

## 2016-03-16 NOTE — Patient Outreach (Signed)
[facility]) Care Management  03/16/2016  Shalay Heidel [facility] [birth year] [id number]   I spoke with Mr. Tyeasha Kueny, son/HCPOA/primary caregiver for Mrs. Kamp this morning. He reports that Mrs. Harnack is receiving Hospice Care in the home and is very pleased with the care Mrs. Dea is receiving. He says that Mrs. Nesheiwat "is about the same" today.    Plan: Mr. Garratt will let me know when might be the best time to stop in for a visit later this week.    [facility]  [phone]

## 2016-03-17 ENCOUNTER — Telehealth: Payer: Self-pay

## 2016-03-17 NOTE — Telephone Encounter (Signed)
-----   Message from Kathrynn Ducking, MD sent at 03/17/2016  7:24 AM EDT ----- Please cancel the October appointment for this patient.

## 2016-03-17 NOTE — Telephone Encounter (Signed)
Appt previously cancelled. Pt is currently under Hospice Care.

## 2016-03-18 ENCOUNTER — Encounter (HOSPITAL_BASED_OUTPATIENT_CLINIC_OR_DEPARTMENT_OTHER): Payer: Medicare Other

## 2016-03-25 ENCOUNTER — Telehealth: Payer: Self-pay | Admitting: Neurology

## 2016-03-25 NOTE — Telephone Encounter (Signed)
Pt's daughter in law called to notify the office pt has passed away.

## 2016-03-25 NOTE — Telephone Encounter (Signed)
Events noted

## 2016-03-26 ENCOUNTER — Ambulatory Visit: Payer: Self-pay | Admitting: Licensed Clinical Social Worker

## 2016-03-26 ENCOUNTER — Other Ambulatory Visit: Payer: Self-pay | Admitting: *Deleted

## 2016-03-26 NOTE — Patient Outreach (Signed)
Erin [name] [facility]) Care Management  03/26/2016  Erin [name] [facility] Jun 08, 1930 [id number]  I was notified by Mrs. Guard's son Erin [name] on 03/26/16 of Mrs. Klaiber's passing at home in the presence of her family and care of Hospice. It has been my honor to assist in the care of Mrs. [employer].   Plan: Case Closure.    [place] Management  [phone]

## 2016-04-01 ENCOUNTER — Ambulatory Visit: Payer: Self-pay | Admitting: Family Medicine

## 2016-04-11 DEATH — deceased

## 2016-04-15 ENCOUNTER — Ambulatory Visit: Payer: Medicare Other | Admitting: Neurology

## 2018-03-28 ENCOUNTER — Encounter (HOSPITAL_COMMUNITY): Payer: Self-pay | Admitting: Physical Therapy

## 2018-03-28 NOTE — Therapy (Signed)
[place], [place], [address] Phone: [phone]   Fax:  [phone]  Patient Details  Name: Erin [name] MRN: [id number] Date of Birth: 15-Sep-1929 Referring Provider:  No ref. provider found  Encounter Date: 03/28/2018  PHYSICAL THERAPY DISCHARGE SUMMARY  Visits from Start of Care: 8  Current functional level related to goals / functional outcomes:  DC due to transition to HHPT, patient now deceased    Remaining deficits: Unable to assess- deceased    Education / Equipment: Unable to assess- deceased  Plan: Patient agrees to discharge.  Patient goals were not met. Patient is being discharged due to not returning since the last visit.  ?????       Deniece Ree PT, DPT, CBIS  Supplemental Physical Therapist [facility]    Pager [phone] Acute Rehab Office [place] [address] [place], [place], [address] Phone: [phone]   Fax:  [phone]
